# Patient Record
Sex: Female | Born: 1935
Health system: Southern US, Community
[De-identification: ages and names within clinical notes are randomized; demographics above are authoritative.]

## PROBLEM LIST (undated history)

## (undated) DIAGNOSIS — I219 Acute myocardial infarction, unspecified: Secondary | ICD-10-CM

## (undated) DIAGNOSIS — R634 Abnormal weight loss: Secondary | ICD-10-CM

## (undated) DIAGNOSIS — E785 Hyperlipidemia, unspecified: Secondary | ICD-10-CM

## (undated) DIAGNOSIS — Z8489 Family history of other specified conditions: Secondary | ICD-10-CM

## (undated) DIAGNOSIS — I10 Essential (primary) hypertension: Secondary | ICD-10-CM

## (undated) DIAGNOSIS — K920 Hematemesis: Secondary | ICD-10-CM

## (undated) DIAGNOSIS — R350 Frequency of micturition: Secondary | ICD-10-CM

## (undated) DIAGNOSIS — Z972 Presence of dental prosthetic device (complete) (partial): Secondary | ICD-10-CM

## (undated) DIAGNOSIS — I340 Nonrheumatic mitral (valve) insufficiency: Secondary | ICD-10-CM

## (undated) DIAGNOSIS — M1990 Unspecified osteoarthritis, unspecified site: Secondary | ICD-10-CM

## (undated) DIAGNOSIS — I951 Orthostatic hypotension: Secondary | ICD-10-CM

## (undated) DIAGNOSIS — Z9889 Other specified postprocedural states: Secondary | ICD-10-CM

## (undated) DIAGNOSIS — R351 Nocturia: Secondary | ICD-10-CM

## (undated) DIAGNOSIS — R110 Nausea: Secondary | ICD-10-CM

## (undated) DIAGNOSIS — B029 Zoster without complications: Secondary | ICD-10-CM

## (undated) DIAGNOSIS — S32000A Wedge compression fracture of unspecified lumbar vertebra, initial encounter for closed fracture: Secondary | ICD-10-CM

## (undated) DIAGNOSIS — I255 Ischemic cardiomyopathy: Secondary | ICD-10-CM

## (undated) DIAGNOSIS — I071 Rheumatic tricuspid insufficiency: Secondary | ICD-10-CM

## (undated) DIAGNOSIS — K219 Gastro-esophageal reflux disease without esophagitis: Secondary | ICD-10-CM

## (undated) DIAGNOSIS — I272 Pulmonary hypertension, unspecified: Secondary | ICD-10-CM

## (undated) DIAGNOSIS — J189 Pneumonia, unspecified organism: Secondary | ICD-10-CM

## (undated) DIAGNOSIS — Z7901 Long term (current) use of anticoagulants: Secondary | ICD-10-CM

## (undated) DIAGNOSIS — T7840XA Allergy, unspecified, initial encounter: Secondary | ICD-10-CM

## (undated) DIAGNOSIS — R9431 Abnormal electrocardiogram [ECG] [EKG]: Secondary | ICD-10-CM

## (undated) DIAGNOSIS — D696 Thrombocytopenia, unspecified: Secondary | ICD-10-CM

## (undated) DIAGNOSIS — J918 Pleural effusion in other conditions classified elsewhere: Secondary | ICD-10-CM

## (undated) DIAGNOSIS — I5022 Chronic systolic (congestive) heart failure: Secondary | ICD-10-CM

## (undated) DIAGNOSIS — K5730 Diverticulosis of large intestine without perforation or abscess without bleeding: Secondary | ICD-10-CM

## (undated) DIAGNOSIS — I447 Left bundle-branch block, unspecified: Secondary | ICD-10-CM

## (undated) DIAGNOSIS — D649 Anemia, unspecified: Secondary | ICD-10-CM

## (undated) DIAGNOSIS — Z973 Presence of spectacles and contact lenses: Secondary | ICD-10-CM

## (undated) DIAGNOSIS — J984 Other disorders of lung: Secondary | ICD-10-CM

## (undated) DIAGNOSIS — H9190 Unspecified hearing loss, unspecified ear: Secondary | ICD-10-CM

## (undated) DIAGNOSIS — J449 Chronic obstructive pulmonary disease, unspecified: Secondary | ICD-10-CM

## (undated) DIAGNOSIS — E539 Vitamin B deficiency, unspecified: Secondary | ICD-10-CM

## (undated) DIAGNOSIS — E039 Hypothyroidism, unspecified: Secondary | ICD-10-CM

## (undated) DIAGNOSIS — I482 Chronic atrial fibrillation, unspecified: Secondary | ICD-10-CM

## (undated) DIAGNOSIS — I2510 Atherosclerotic heart disease of native coronary artery without angina pectoris: Secondary | ICD-10-CM

## (undated) HISTORY — DX: Pulmonary hypertension, unspecified: I27.20

## (undated) HISTORY — DX: Unspecified osteoarthritis, unspecified site: M19.90

## (undated) HISTORY — DX: Abnormal electrocardiogram (ECG) (EKG): R94.31

## (undated) HISTORY — DX: Hyperlipidemia, unspecified: E78.5

## (undated) HISTORY — DX: Nonrheumatic mitral (valve) insufficiency: I34.0

## (undated) HISTORY — DX: Atherosclerotic heart disease of native coronary artery without angina pectoris: I25.10

## (undated) HISTORY — DX: Abnormal weight loss: R63.4

## (undated) HISTORY — DX: Anemia, unspecified: D64.9

## (undated) HISTORY — DX: Zoster without complications: B02.9

## (undated) HISTORY — PX: TONSILLECTOMY: SUR1361

## (undated) HISTORY — DX: Allergy, unspecified, initial encounter: T78.40XA

## (undated) HISTORY — DX: Pneumonia, unspecified organism: J18.9

## (undated) HISTORY — DX: Vitamin B deficiency, unspecified: E53.9

## (undated) HISTORY — DX: Nausea: R11.0

## (undated) HISTORY — DX: Hypothyroidism, unspecified: E03.9

## (undated) HISTORY — DX: Diverticulosis of large intestine without perforation or abscess without bleeding: K57.30

## (undated) HISTORY — DX: Wedge compression fracture of unspecified lumbar vertebra, initial encounter for closed fracture: S32.000A

## (undated) HISTORY — DX: Long term (current) use of anticoagulants: Z79.01

## (undated) HISTORY — DX: Rheumatic tricuspid insufficiency: I07.1

## (undated) HISTORY — DX: Pleural effusion in other conditions classified elsewhere: J91.8

## (undated) HISTORY — PX: COLONOSCOPY: SHX174

## (undated) HISTORY — DX: Left bundle-branch block, unspecified: I44.7

## (undated) HISTORY — DX: Other specified postprocedural states: Z98.890

## (undated) HISTORY — DX: Hematemesis: K92.0

## (undated) HISTORY — DX: Other disorders of lung: J98.4

## (undated) HISTORY — DX: Ischemic cardiomyopathy: I25.5

## (undated) HISTORY — DX: Orthostatic hypotension: I95.1

## (undated) HISTORY — DX: Essential (primary) hypertension: I10

## (undated) HISTORY — DX: Chronic obstructive pulmonary disease, unspecified: J44.9

## (undated) HISTORY — DX: Thrombocytopenia, unspecified: D69.6

## (undated) HISTORY — DX: Gastro-esophageal reflux disease without esophagitis: K21.9

---

## 1960-09-13 HISTORY — PX: THYROIDECTOMY: SHX17

## 2002-09-13 HISTORY — PX: CORONARY ANGIOPLASTY: SHX604

## 2003-08-27 ENCOUNTER — Other Ambulatory Visit: Payer: Self-pay

## 2003-12-26 ENCOUNTER — Encounter: Payer: Self-pay | Admitting: Pulmonary Disease

## 2004-04-13 ENCOUNTER — Encounter: Admission: RE | Admit: 2004-04-13 | Discharge: 2004-04-13 | Payer: Self-pay | Admitting: Pulmonary Disease

## 2004-07-17 ENCOUNTER — Ambulatory Visit: Payer: Self-pay | Admitting: Cardiology

## 2004-07-17 ENCOUNTER — Ambulatory Visit: Payer: Self-pay

## 2004-08-14 ENCOUNTER — Ambulatory Visit: Payer: Self-pay | Admitting: Cardiology

## 2004-09-10 ENCOUNTER — Ambulatory Visit: Payer: Self-pay | Admitting: *Deleted

## 2004-09-25 ENCOUNTER — Ambulatory Visit: Payer: Self-pay | Admitting: Cardiovascular Disease

## 2004-11-16 ENCOUNTER — Encounter: Admission: RE | Admit: 2004-11-16 | Discharge: 2004-11-16 | Payer: Self-pay | Admitting: Pulmonary Disease

## 2005-07-19 ENCOUNTER — Ambulatory Visit: Payer: Self-pay | Admitting: Cardiology

## 2005-08-02 ENCOUNTER — Encounter: Payer: Self-pay | Admitting: Cardiology

## 2005-08-02 ENCOUNTER — Ambulatory Visit: Payer: Self-pay

## 2005-08-02 ENCOUNTER — Ambulatory Visit: Payer: Self-pay | Admitting: Gastroenterology

## 2005-08-18 ENCOUNTER — Ambulatory Visit: Payer: Self-pay | Admitting: Gastroenterology

## 2005-08-25 ENCOUNTER — Ambulatory Visit: Payer: Self-pay | Admitting: Gastroenterology

## 2005-08-25 ENCOUNTER — Encounter: Payer: Self-pay | Admitting: Family Medicine

## 2005-08-25 ENCOUNTER — Ambulatory Visit (HOSPITAL_COMMUNITY): Admission: RE | Admit: 2005-08-25 | Discharge: 2005-08-25 | Payer: Self-pay | Admitting: Gastroenterology

## 2006-07-22 ENCOUNTER — Ambulatory Visit: Payer: Self-pay | Admitting: Cardiology

## 2006-08-02 ENCOUNTER — Encounter: Payer: Self-pay | Admitting: Internal Medicine

## 2006-08-02 ENCOUNTER — Ambulatory Visit: Payer: Self-pay

## 2006-08-16 ENCOUNTER — Ambulatory Visit: Payer: Self-pay | Admitting: Cardiology

## 2006-09-19 ENCOUNTER — Ambulatory Visit: Payer: Self-pay | Admitting: Cardiology

## 2006-09-30 ENCOUNTER — Encounter: Payer: Self-pay | Admitting: Family Medicine

## 2006-10-27 ENCOUNTER — Encounter: Payer: Self-pay | Admitting: Family Medicine

## 2006-10-31 ENCOUNTER — Ambulatory Visit: Payer: Self-pay | Admitting: Cardiology

## 2006-11-23 ENCOUNTER — Encounter: Payer: Self-pay | Admitting: Family Medicine

## 2006-11-30 ENCOUNTER — Encounter: Payer: Self-pay | Admitting: Family Medicine

## 2006-12-06 ENCOUNTER — Ambulatory Visit: Payer: Self-pay | Admitting: Cardiology

## 2006-12-28 ENCOUNTER — Encounter: Payer: Self-pay | Admitting: Family Medicine

## 2007-02-02 ENCOUNTER — Encounter: Payer: Self-pay | Admitting: Family Medicine

## 2007-02-24 ENCOUNTER — Ambulatory Visit: Payer: Self-pay | Admitting: Cardiology

## 2007-02-27 ENCOUNTER — Encounter: Payer: Self-pay | Admitting: Family Medicine

## 2007-03-10 ENCOUNTER — Ambulatory Visit: Payer: Self-pay | Admitting: Cardiology

## 2007-03-10 ENCOUNTER — Encounter: Payer: Self-pay | Admitting: Family Medicine

## 2007-03-10 ENCOUNTER — Inpatient Hospital Stay (HOSPITAL_COMMUNITY): Admission: EM | Admit: 2007-03-10 | Discharge: 2007-03-22 | Payer: Self-pay | Admitting: Emergency Medicine

## 2007-03-13 ENCOUNTER — Encounter: Payer: Self-pay | Admitting: Cardiology

## 2007-03-14 DIAGNOSIS — J189 Pneumonia, unspecified organism: Secondary | ICD-10-CM

## 2007-03-14 HISTORY — DX: Pneumonia, unspecified organism: J18.9

## 2007-03-21 ENCOUNTER — Encounter: Payer: Self-pay | Admitting: Family Medicine

## 2007-03-23 ENCOUNTER — Telehealth (INDEPENDENT_AMBULATORY_CARE_PROVIDER_SITE_OTHER): Payer: Self-pay | Admitting: *Deleted

## 2007-03-24 ENCOUNTER — Ambulatory Visit: Payer: Self-pay | Admitting: Internal Medicine

## 2007-03-24 ENCOUNTER — Encounter: Payer: Self-pay | Admitting: Family Medicine

## 2007-03-27 ENCOUNTER — Ambulatory Visit: Payer: Self-pay | Admitting: Cardiology

## 2007-03-31 ENCOUNTER — Ambulatory Visit: Payer: Self-pay | Admitting: Internal Medicine

## 2007-03-31 ENCOUNTER — Ambulatory Visit: Payer: Self-pay

## 2007-03-31 ENCOUNTER — Encounter: Payer: Self-pay | Admitting: Family Medicine

## 2007-04-11 ENCOUNTER — Ambulatory Visit: Payer: Self-pay | Admitting: Family Medicine

## 2007-04-11 DIAGNOSIS — R0902 Hypoxemia: Secondary | ICD-10-CM

## 2007-04-11 DIAGNOSIS — D518 Other vitamin B12 deficiency anemias: Secondary | ICD-10-CM

## 2007-04-11 DIAGNOSIS — E039 Hypothyroidism, unspecified: Secondary | ICD-10-CM

## 2007-04-12 ENCOUNTER — Encounter: Payer: Self-pay | Admitting: Family Medicine

## 2007-04-12 ENCOUNTER — Ambulatory Visit: Payer: Self-pay | Admitting: Cardiology

## 2007-04-13 ENCOUNTER — Encounter: Payer: Self-pay | Admitting: Family Medicine

## 2007-04-14 LAB — CONVERTED CEMR LAB
Basophils Absolute: 0.2 10*3/uL — ABNORMAL HIGH (ref 0.0–0.1)
Basophils Relative: 3.1 % — ABNORMAL HIGH (ref 0.0–1.0)
Hemoglobin: 11.6 g/dL — ABNORMAL LOW (ref 12.0–15.0)
MCHC: 33.8 g/dL (ref 30.0–36.0)
Monocytes Absolute: 0.9 10*3/uL — ABNORMAL HIGH (ref 0.2–0.7)
Monocytes Relative: 13.8 % — ABNORMAL HIGH (ref 3.0–11.0)
Platelets: 185 10*3/uL (ref 150–400)
RBC: 3.8 M/uL — ABNORMAL LOW (ref 3.87–5.11)
RDW: 14.3 % (ref 11.5–14.6)
TSH: 2.38 microintl units/mL (ref 0.35–5.50)
WBC: 6.7 10*3/uL (ref 4.5–10.5)

## 2007-04-26 ENCOUNTER — Encounter: Payer: Self-pay | Admitting: Family Medicine

## 2007-05-11 ENCOUNTER — Ambulatory Visit: Payer: Self-pay | Admitting: Family Medicine

## 2007-05-11 DIAGNOSIS — H9319 Tinnitus, unspecified ear: Secondary | ICD-10-CM | POA: Insufficient documentation

## 2007-05-11 DIAGNOSIS — H8110 Benign paroxysmal vertigo, unspecified ear: Secondary | ICD-10-CM

## 2007-05-11 DIAGNOSIS — M810 Age-related osteoporosis without current pathological fracture: Secondary | ICD-10-CM | POA: Insufficient documentation

## 2007-05-18 ENCOUNTER — Ambulatory Visit: Payer: Self-pay | Admitting: Cardiology

## 2007-05-19 ENCOUNTER — Telehealth: Payer: Self-pay | Admitting: Family Medicine

## 2007-05-19 ENCOUNTER — Encounter: Payer: Self-pay | Admitting: Family Medicine

## 2007-05-23 ENCOUNTER — Ambulatory Visit (HOSPITAL_COMMUNITY): Admission: RE | Admit: 2007-05-23 | Discharge: 2007-05-23 | Payer: Self-pay | Admitting: Cardiology

## 2007-05-23 ENCOUNTER — Ambulatory Visit: Payer: Self-pay | Admitting: Cardiology

## 2007-05-25 ENCOUNTER — Telehealth (INDEPENDENT_AMBULATORY_CARE_PROVIDER_SITE_OTHER): Payer: Self-pay | Admitting: *Deleted

## 2007-05-31 ENCOUNTER — Ambulatory Visit: Payer: Self-pay | Admitting: Family Medicine

## 2007-05-31 LAB — CONVERTED CEMR LAB
INR: 3.1
Prothrombin Time: 21.1 s

## 2007-06-05 ENCOUNTER — Ambulatory Visit: Payer: Self-pay | Admitting: Cardiology

## 2007-06-13 ENCOUNTER — Telehealth (INDEPENDENT_AMBULATORY_CARE_PROVIDER_SITE_OTHER): Payer: Self-pay | Admitting: *Deleted

## 2007-06-28 ENCOUNTER — Ambulatory Visit: Payer: Self-pay | Admitting: Family Medicine

## 2007-06-28 LAB — CONVERTED CEMR LAB: Prothrombin Time: 19.7 s

## 2007-07-19 ENCOUNTER — Ambulatory Visit: Payer: Self-pay | Admitting: Family Medicine

## 2007-07-19 ENCOUNTER — Ambulatory Visit: Payer: Self-pay | Admitting: Cardiology

## 2007-07-19 DIAGNOSIS — R350 Frequency of micturition: Secondary | ICD-10-CM | POA: Insufficient documentation

## 2007-07-19 LAB — CONVERTED CEMR LAB
Bacteria, UA: 0
Bilirubin Urine: NEGATIVE
Casts: 0 /lpf
Glucose, Urine, Semiquant: NEGATIVE
Mucus, UA: 0
Nitrite: NEGATIVE
Prothrombin Time: 17.5 s
RBC / HPF: 0
Specific Gravity, Urine: 1.02
Urine crystals, microscopic: 0 /hpf
pH: 5

## 2007-07-24 ENCOUNTER — Encounter (INDEPENDENT_AMBULATORY_CARE_PROVIDER_SITE_OTHER): Payer: Self-pay | Admitting: *Deleted

## 2007-07-24 LAB — CONVERTED CEMR LAB
Basophils Absolute: 0 10*3/uL (ref 0.0–0.1)
Eosinophils Absolute: 0.1 10*3/uL (ref 0.0–0.6)
HCT: 36.2 % (ref 36.0–46.0)
MCHC: 35 g/dL (ref 30.0–36.0)
MCV: 92 fL (ref 78.0–100.0)
Monocytes Absolute: 0.5 10*3/uL (ref 0.2–0.7)
Monocytes Relative: 9.6 % (ref 3.0–11.0)
Neutrophils Relative %: 63.6 % (ref 43.0–77.0)
RBC: 3.94 M/uL (ref 3.87–5.11)
TSH: 0.29 microintl units/mL — ABNORMAL LOW (ref 0.35–5.50)
Vitamin B-12: >1500 pg/mL — ABNORMAL HIGH (ref 211–911)

## 2007-08-24 ENCOUNTER — Ambulatory Visit: Payer: Self-pay | Admitting: Family Medicine

## 2007-08-28 LAB — CONVERTED CEMR LAB
ALT: 22 units/L (ref 0–35)
HDL: 51.8 mg/dL (ref >39.0)
LDL Cholesterol: 84 mg/dL (ref 0–99)
Triglycerides: 47 mg/dL (ref 0–149)
VLDL: 9 mg/dL (ref 0–40)

## 2007-09-05 ENCOUNTER — Ambulatory Visit: Payer: Self-pay | Admitting: Family Medicine

## 2007-09-13 ENCOUNTER — Ambulatory Visit: Payer: Self-pay | Admitting: Family Medicine

## 2007-09-13 DIAGNOSIS — I872 Venous insufficiency (chronic) (peripheral): Secondary | ICD-10-CM | POA: Insufficient documentation

## 2007-09-19 ENCOUNTER — Ambulatory Visit: Payer: Self-pay | Admitting: Family Medicine

## 2007-09-19 LAB — CONVERTED CEMR LAB: INR: 1.6

## 2007-09-26 ENCOUNTER — Telehealth: Payer: Self-pay | Admitting: Family Medicine

## 2007-09-27 ENCOUNTER — Telehealth (INDEPENDENT_AMBULATORY_CARE_PROVIDER_SITE_OTHER): Payer: Self-pay | Admitting: *Deleted

## 2007-09-27 ENCOUNTER — Ambulatory Visit: Payer: Self-pay | Admitting: Family Medicine

## 2007-09-27 LAB — CONVERTED CEMR LAB
INR: 1.9
INR: 1.9 — ABNORMAL HIGH (ref 0.8–1.0)
Prothrombin Time: 17.1 s
Prothrombin Time: 17.1 s — ABNORMAL HIGH (ref 10.9–13.3)

## 2007-10-11 ENCOUNTER — Ambulatory Visit: Payer: Self-pay | Admitting: Family Medicine

## 2007-11-14 ENCOUNTER — Ambulatory Visit: Payer: Self-pay | Admitting: Internal Medicine

## 2007-12-18 ENCOUNTER — Encounter: Payer: Self-pay | Admitting: Family Medicine

## 2007-12-18 ENCOUNTER — Ambulatory Visit: Payer: Self-pay | Admitting: Cardiology

## 2007-12-18 ENCOUNTER — Ambulatory Visit: Payer: Self-pay | Admitting: Internal Medicine

## 2007-12-18 LAB — CONVERTED CEMR LAB
INR range: 17.5
INR: 2
Prothrombin Time: 17 s

## 2008-01-02 ENCOUNTER — Telehealth: Payer: Self-pay | Admitting: Family Medicine

## 2008-01-08 ENCOUNTER — Ambulatory Visit: Payer: Self-pay | Admitting: Family Medicine

## 2008-01-09 LAB — CONVERTED CEMR LAB
INR: 2.7 — ABNORMAL HIGH (ref 0.8–1.0)
Prothrombin Time: 28.3 s — ABNORMAL HIGH (ref 10.9–13.3)

## 2008-02-06 ENCOUNTER — Ambulatory Visit: Payer: Self-pay | Admitting: Family Medicine

## 2008-02-08 ENCOUNTER — Telehealth (INDEPENDENT_AMBULATORY_CARE_PROVIDER_SITE_OTHER): Payer: Self-pay | Admitting: *Deleted

## 2008-02-16 ENCOUNTER — Ambulatory Visit: Payer: Self-pay | Admitting: Family Medicine

## 2008-02-16 LAB — CONVERTED CEMR LAB

## 2008-03-18 ENCOUNTER — Ambulatory Visit: Payer: Self-pay | Admitting: Family Medicine

## 2008-03-19 LAB — CONVERTED CEMR LAB
INR: 2.7 — ABNORMAL HIGH (ref 0.8–1.0)
Prothrombin Time: 28.5 s — ABNORMAL HIGH (ref 10.9–13.3)
TSH: 0.73 microintl units/mL (ref 0.35–5.50)

## 2008-04-15 ENCOUNTER — Telehealth (INDEPENDENT_AMBULATORY_CARE_PROVIDER_SITE_OTHER): Payer: Self-pay | Admitting: *Deleted

## 2008-04-15 ENCOUNTER — Ambulatory Visit: Payer: Self-pay | Admitting: Family Medicine

## 2008-04-15 LAB — CONVERTED CEMR LAB
INR: 2.6
Prothrombin Time: 19.5 s

## 2008-04-16 ENCOUNTER — Emergency Department (HOSPITAL_COMMUNITY): Admission: EM | Admit: 2008-04-16 | Discharge: 2008-04-16 | Payer: Self-pay | Admitting: Emergency Medicine

## 2008-04-16 ENCOUNTER — Encounter: Payer: Self-pay | Admitting: Family Medicine

## 2008-04-18 ENCOUNTER — Ambulatory Visit: Payer: Self-pay | Admitting: Family Medicine

## 2008-04-18 DIAGNOSIS — J984 Other disorders of lung: Secondary | ICD-10-CM | POA: Insufficient documentation

## 2008-04-26 ENCOUNTER — Ambulatory Visit: Payer: Self-pay | Admitting: Family Medicine

## 2008-04-26 LAB — CONVERTED CEMR LAB: INR: 2.3

## 2008-05-10 ENCOUNTER — Ambulatory Visit: Payer: Self-pay | Admitting: Family Medicine

## 2008-05-10 LAB — CONVERTED CEMR LAB
Bilirubin Urine: NEGATIVE
Glucose, Urine, Semiquant: NEGATIVE
Ketones, urine, test strip: NEGATIVE
Protein, U semiquant: NEGATIVE
Yeast, UA: 0
pH: 6

## 2008-05-13 ENCOUNTER — Ambulatory Visit: Payer: Self-pay | Admitting: Pulmonary Disease

## 2008-05-13 DIAGNOSIS — R599 Enlarged lymph nodes, unspecified: Secondary | ICD-10-CM | POA: Insufficient documentation

## 2008-05-14 ENCOUNTER — Telehealth: Payer: Self-pay | Admitting: Family Medicine

## 2008-05-14 DIAGNOSIS — S32000A Wedge compression fracture of unspecified lumbar vertebra, initial encounter for closed fracture: Secondary | ICD-10-CM

## 2008-05-14 HISTORY — DX: Wedge compression fracture of unspecified lumbar vertebra, initial encounter for closed fracture: S32.000A

## 2008-05-15 ENCOUNTER — Encounter: Payer: Self-pay | Admitting: Family Medicine

## 2008-05-21 ENCOUNTER — Ambulatory Visit (HOSPITAL_COMMUNITY): Admission: RE | Admit: 2008-05-21 | Discharge: 2008-05-21 | Payer: Self-pay | Admitting: Pulmonary Disease

## 2008-05-23 ENCOUNTER — Telehealth: Payer: Self-pay | Admitting: Pulmonary Disease

## 2008-05-24 ENCOUNTER — Telehealth (INDEPENDENT_AMBULATORY_CARE_PROVIDER_SITE_OTHER): Payer: Self-pay | Admitting: *Deleted

## 2008-06-07 ENCOUNTER — Ambulatory Visit: Payer: Self-pay | Admitting: Family Medicine

## 2008-06-07 DIAGNOSIS — R498 Other voice and resonance disorders: Secondary | ICD-10-CM

## 2008-06-10 LAB — CONVERTED CEMR LAB
Albumin: 4.3 g/dL (ref 3.5–5.2)
BUN: 29 mg/dL — ABNORMAL HIGH (ref 6–23)
Calcium: 9.4 mg/dL (ref 8.4–10.5)
Chloride: 108 meq/L (ref 96–112)
Folate: >20 ng/mL
Glucose, Bld: 86 mg/dL (ref 70–99)
Lymphs Abs: 1.9 10*3/uL (ref 0.7–4.0)
MCV: 94.2 fL (ref 78.0–100.0)
Monocytes Relative: 12 % (ref 3–12)
Neutro Abs: 4 10*3/uL (ref 1.7–7.7)
Neutrophils Relative %: 59 % (ref 43–77)
Potassium: 4.7 meq/L (ref 3.5–5.3)
RBC: 4.32 M/uL (ref 3.87–5.11)
WBC: 6.8 10*3/uL (ref 4.0–10.5)

## 2008-06-12 ENCOUNTER — Ambulatory Visit: Payer: Self-pay | Admitting: Cardiology

## 2008-06-12 ENCOUNTER — Ambulatory Visit: Payer: Self-pay

## 2008-07-04 ENCOUNTER — Ambulatory Visit: Payer: Self-pay | Admitting: Cardiology

## 2008-07-04 ENCOUNTER — Ambulatory Visit: Payer: Self-pay | Admitting: Family Medicine

## 2008-07-04 LAB — CONVERTED CEMR LAB: Prothrombin Time: 17.1 s

## 2008-07-08 ENCOUNTER — Ambulatory Visit: Payer: Self-pay | Admitting: Family Medicine

## 2008-08-07 ENCOUNTER — Ambulatory Visit: Payer: Self-pay | Admitting: Family Medicine

## 2008-09-04 ENCOUNTER — Ambulatory Visit: Payer: Self-pay | Admitting: Family Medicine

## 2008-09-04 LAB — CONVERTED CEMR LAB: Prothrombin Time: 17.1 s

## 2008-09-18 ENCOUNTER — Ambulatory Visit: Payer: Self-pay | Admitting: Family Medicine

## 2008-09-18 LAB — CONVERTED CEMR LAB: INR: 2.2

## 2008-10-15 ENCOUNTER — Ambulatory Visit: Payer: Self-pay | Admitting: Family Medicine

## 2008-10-15 LAB — CONVERTED CEMR LAB
INR: 2.6
Prothrombin Time: 19.7 s

## 2008-11-08 ENCOUNTER — Ambulatory Visit: Payer: Self-pay | Admitting: Family Medicine

## 2008-11-08 LAB — CONVERTED CEMR LAB
INR: 3
Prothrombin Time: 20.9 s

## 2008-12-05 ENCOUNTER — Ambulatory Visit: Payer: Self-pay | Admitting: Family Medicine

## 2008-12-05 ENCOUNTER — Encounter: Payer: Self-pay | Admitting: Cardiology

## 2008-12-05 ENCOUNTER — Ambulatory Visit: Payer: Self-pay | Admitting: Cardiology

## 2008-12-05 DIAGNOSIS — J309 Allergic rhinitis, unspecified: Secondary | ICD-10-CM | POA: Insufficient documentation

## 2008-12-11 LAB — CONVERTED CEMR LAB
AST: 30 units/L (ref 0–37)
Basophils Relative: 0.4 % (ref 0.0–3.0)
Eosinophils Absolute: 0.1 10*3/uL (ref 0.0–0.7)
LDL Cholesterol: 70 mg/dL (ref 0–99)
MCHC: 33 g/dL (ref 30.0–36.0)
MCV: 94.2 fL (ref 78.0–100.0)
Monocytes Absolute: 0.7 10*3/uL (ref 0.1–1.0)
Neutrophils Relative %: 61.8 % (ref 43.0–77.0)
Platelets: 105 10*3/uL — ABNORMAL LOW (ref 150.0–400.0)
Potassium: 4.3 meq/L (ref 3.5–5.1)
RBC: 4.16 M/uL (ref 3.87–5.11)
Sodium: 142 meq/L (ref 135–145)
Total CHOL/HDL Ratio: 2

## 2009-01-01 ENCOUNTER — Encounter: Payer: Self-pay | Admitting: Family Medicine

## 2009-01-02 ENCOUNTER — Ambulatory Visit: Payer: Self-pay | Admitting: Family Medicine

## 2009-01-02 LAB — CONVERTED CEMR LAB
INR: 2.5
Prothrombin Time: 19.4 s

## 2009-01-03 DIAGNOSIS — D696 Thrombocytopenia, unspecified: Secondary | ICD-10-CM

## 2009-01-03 LAB — CONVERTED CEMR LAB
Basophils Relative: 0.8 % (ref 0.0–3.0)
Eosinophils Relative: 1.2 % (ref 0.0–5.0)
Hemoglobin: 12.7 g/dL (ref 12.0–15.0)
Lymphocytes Relative: 26.5 % (ref 12.0–46.0)
MCHC: 33.8 g/dL (ref 30.0–36.0)
Monocytes Relative: 13.2 % — ABNORMAL HIGH (ref 3.0–12.0)
Neutro Abs: 3.5 10*3/uL (ref 1.4–7.7)
RBC: 3.98 M/uL (ref 3.87–5.11)
WBC: 6 10*3/uL (ref 4.5–10.5)

## 2009-01-07 ENCOUNTER — Ambulatory Visit: Payer: Self-pay | Admitting: Oncology

## 2009-01-28 ENCOUNTER — Encounter: Payer: Self-pay | Admitting: Family Medicine

## 2009-01-28 ENCOUNTER — Encounter: Payer: Self-pay | Admitting: Cardiology

## 2009-01-28 LAB — CBC WITH DIFFERENTIAL (CANCER CENTER ONLY)
BASO%: 0.7 % (ref 0.0–2.0)
EOS%: 1.9 % (ref 0.0–7.0)
HCT: 38.1 % (ref 34.8–46.6)
LYMPH%: 26 % (ref 14.0–48.0)
MCH: 31 pg (ref 26.0–34.0)
MCHC: 34.5 g/dL (ref 32.0–36.0)
MCV: 90 fL (ref 81–101)
NEUT%: 61 % (ref 39.6–80.0)
RDW: 13.3 % (ref 10.5–14.6)

## 2009-01-28 LAB — CMP (CANCER CENTER ONLY)
AST: 32 U/L (ref 11–38)
Albumin: 3.6 g/dL (ref 3.3–5.5)
BUN, Bld: 23 mg/dL — ABNORMAL HIGH (ref 7–22)
Calcium: 9.3 mg/dL (ref 8.0–10.3)
Chloride: 105 mEq/L (ref 98–108)
Glucose, Bld: 92 mg/dL (ref 73–118)
Potassium: 4.1 mEq/L (ref 3.3–4.7)
Sodium: 146 mEq/L — ABNORMAL HIGH (ref 128–145)
Total Protein: 6.7 g/dL (ref 6.4–8.1)

## 2009-01-28 LAB — MORPHOLOGY - CHCC SATELLITE

## 2009-01-29 ENCOUNTER — Ambulatory Visit: Payer: Self-pay | Admitting: Family Medicine

## 2009-01-30 LAB — PROTEIN ELECTROPHORESIS, SERUM
Albumin ELP: 61.8 % (ref 55.8–66.1)
Beta 2: 3.2 % (ref 3.2–6.5)
Beta Globulin: 6.3 % (ref 4.7–7.2)
Total Protein, Serum Electrophoresis: 6.2 g/dL (ref 6.0–8.3)

## 2009-02-03 ENCOUNTER — Ambulatory Visit: Payer: Self-pay | Admitting: Cardiovascular Disease

## 2009-02-03 ENCOUNTER — Encounter: Payer: Self-pay | Admitting: Nurse Practitioner

## 2009-02-03 LAB — CONVERTED CEMR LAB
CO2: 29 meq/L (ref 19–32)
Chloride: 111 meq/L (ref 96–112)
Glucose, Bld: 89 mg/dL (ref 70–99)
Potassium: 4.3 meq/L (ref 3.5–5.1)
Sodium: 144 meq/L (ref 135–145)

## 2009-02-12 ENCOUNTER — Ambulatory Visit: Payer: Self-pay | Admitting: Cardiology

## 2009-02-13 LAB — CONVERTED CEMR LAB
BUN: 25 mg/dL — ABNORMAL HIGH (ref 6–23)
CO2: 32 meq/L (ref 19–32)
Chloride: 111 meq/L (ref 96–112)
Creatinine, Ser: 1 mg/dL (ref 0.4–1.2)
Potassium: 4.4 meq/L (ref 3.5–5.1)

## 2009-02-21 ENCOUNTER — Ambulatory Visit: Payer: Self-pay | Admitting: Oncology

## 2009-02-24 ENCOUNTER — Ambulatory Visit: Payer: Self-pay | Admitting: Family Medicine

## 2009-03-18 ENCOUNTER — Encounter: Payer: Self-pay | Admitting: Cardiology

## 2009-03-18 DIAGNOSIS — K219 Gastro-esophageal reflux disease without esophagitis: Secondary | ICD-10-CM

## 2009-03-20 ENCOUNTER — Ambulatory Visit: Payer: Self-pay | Admitting: Family Medicine

## 2009-03-20 ENCOUNTER — Ambulatory Visit: Payer: Self-pay | Admitting: Cardiology

## 2009-03-20 DIAGNOSIS — M109 Gout, unspecified: Secondary | ICD-10-CM

## 2009-03-20 LAB — CONVERTED CEMR LAB: INR: 2.3

## 2009-04-11 ENCOUNTER — Ambulatory Visit: Payer: Self-pay | Admitting: Oncology

## 2009-04-15 ENCOUNTER — Ambulatory Visit (HOSPITAL_COMMUNITY): Admission: RE | Admit: 2009-04-15 | Discharge: 2009-04-15 | Payer: Self-pay | Admitting: Oncology

## 2009-04-15 LAB — BASIC METABOLIC PANEL - CANCER CENTER ONLY
BUN, Bld: 15 mg/dL (ref 7–22)
CO2: 33 mEq/L (ref 18–33)
Calcium: 9 mg/dL (ref 8.0–10.3)
Creat: 0.9 mg/dl (ref 0.6–1.2)
Glucose, Bld: 73 mg/dL (ref 73–118)

## 2009-04-15 LAB — CBC WITH DIFFERENTIAL (CANCER CENTER ONLY)
Eosinophils Absolute: 0.1 10*3/uL (ref 0.0–0.5)
HGB: 12.6 g/dL (ref 11.6–15.9)
LYMPH%: 26.6 % (ref 14.0–48.0)
MCH: 30.5 pg (ref 26.0–34.0)
MCHC: 34.5 g/dL (ref 32.0–36.0)
MONO#: 0.6 10*3/uL (ref 0.1–0.9)
NEUT%: 60.8 % (ref 39.6–80.0)
RDW: 13.9 % (ref 10.5–14.6)

## 2009-04-17 ENCOUNTER — Ambulatory Visit: Payer: Self-pay | Admitting: Family Medicine

## 2009-04-17 LAB — CONVERTED CEMR LAB
INR: 2.8
Prothrombin Time: 20.4 s

## 2009-04-21 ENCOUNTER — Telehealth: Payer: Self-pay | Admitting: Cardiology

## 2009-04-30 ENCOUNTER — Ambulatory Visit: Payer: Self-pay | Admitting: Family Medicine

## 2009-05-02 ENCOUNTER — Telehealth: Payer: Self-pay | Admitting: Family Medicine

## 2009-05-05 ENCOUNTER — Telehealth: Payer: Self-pay | Admitting: Family Medicine

## 2009-05-06 ENCOUNTER — Encounter: Payer: Self-pay | Admitting: Family Medicine

## 2009-05-09 ENCOUNTER — Telehealth: Payer: Self-pay | Admitting: Cardiology

## 2009-05-12 ENCOUNTER — Encounter: Payer: Self-pay | Admitting: Family Medicine

## 2009-05-22 ENCOUNTER — Telehealth: Payer: Self-pay | Admitting: Family Medicine

## 2009-05-26 ENCOUNTER — Encounter: Payer: Self-pay | Admitting: Family Medicine

## 2009-05-26 ENCOUNTER — Telehealth: Payer: Self-pay | Admitting: Family Medicine

## 2009-05-27 ENCOUNTER — Telehealth: Payer: Self-pay | Admitting: Family Medicine

## 2009-05-29 ENCOUNTER — Ambulatory Visit: Payer: Self-pay | Admitting: Family Medicine

## 2009-05-29 LAB — CONVERTED CEMR LAB: Prothrombin Time: 18.4 s

## 2009-06-12 ENCOUNTER — Encounter: Payer: Self-pay | Admitting: Family Medicine

## 2009-06-24 ENCOUNTER — Encounter: Payer: Self-pay | Admitting: Cardiology

## 2009-06-24 DIAGNOSIS — I10 Essential (primary) hypertension: Secondary | ICD-10-CM | POA: Insufficient documentation

## 2009-06-25 ENCOUNTER — Ambulatory Visit: Payer: Self-pay | Admitting: Cardiology

## 2009-06-25 ENCOUNTER — Ambulatory Visit: Payer: Self-pay | Admitting: Family Medicine

## 2009-06-25 LAB — CONVERTED CEMR LAB: INR: 2.8

## 2009-07-23 ENCOUNTER — Ambulatory Visit: Payer: Self-pay | Admitting: Family Medicine

## 2009-09-04 ENCOUNTER — Ambulatory Visit: Payer: Self-pay | Admitting: Internal Medicine

## 2009-09-04 LAB — CONVERTED CEMR LAB
INR: 2.7
Prothrombin Time: 19.8 s

## 2009-10-10 ENCOUNTER — Ambulatory Visit: Payer: Self-pay | Admitting: Internal Medicine

## 2009-10-10 LAB — CONVERTED CEMR LAB
INR: 1.9
Prothrombin Time: 16.8 s

## 2009-11-07 ENCOUNTER — Ambulatory Visit: Payer: Self-pay | Admitting: Internal Medicine

## 2009-11-07 LAB — CONVERTED CEMR LAB: INR: 2

## 2009-12-18 ENCOUNTER — Encounter: Payer: Self-pay | Admitting: Cardiology

## 2009-12-19 ENCOUNTER — Ambulatory Visit: Payer: Self-pay | Admitting: Cardiology

## 2009-12-19 ENCOUNTER — Ambulatory Visit: Payer: Self-pay | Admitting: Internal Medicine

## 2009-12-19 LAB — CONVERTED CEMR LAB: INR: 1.9

## 2009-12-22 LAB — CONVERTED CEMR LAB
BUN: 25 mg/dL — ABNORMAL HIGH (ref 6–23)
Calcium: 9.2 mg/dL (ref 8.4–10.5)
Chloride: 105 meq/L (ref 96–112)
Creatinine, Ser: 1.2 mg/dL (ref 0.4–1.2)
GFR calc non Af Amer: 45.25 mL/min (ref >60)

## 2009-12-23 ENCOUNTER — Encounter: Payer: Self-pay | Admitting: Family Medicine

## 2009-12-23 ENCOUNTER — Telehealth: Payer: Self-pay | Admitting: Family Medicine

## 2009-12-29 ENCOUNTER — Ambulatory Visit: Payer: Self-pay | Admitting: Family Medicine

## 2010-01-16 ENCOUNTER — Ambulatory Visit: Payer: Self-pay | Admitting: Internal Medicine

## 2010-02-19 ENCOUNTER — Ambulatory Visit: Payer: Self-pay | Admitting: Internal Medicine

## 2010-03-17 ENCOUNTER — Ambulatory Visit: Payer: Self-pay | Admitting: Internal Medicine

## 2010-03-17 LAB — CONVERTED CEMR LAB
INR: 1.9
Prothrombin Time: 22.8 s

## 2010-04-14 ENCOUNTER — Telehealth: Payer: Self-pay | Admitting: Family Medicine

## 2010-04-17 ENCOUNTER — Ambulatory Visit: Payer: Self-pay | Admitting: Internal Medicine

## 2010-04-17 LAB — CONVERTED CEMR LAB
INR: 2.4
Prothrombin Time: 28.2 s

## 2010-05-22 ENCOUNTER — Ambulatory Visit: Payer: Self-pay | Admitting: Internal Medicine

## 2010-05-22 LAB — CONVERTED CEMR LAB
INR: 2.4
Prothrombin Time: 28.2 s

## 2010-06-18 ENCOUNTER — Ambulatory Visit: Payer: Self-pay

## 2010-06-18 ENCOUNTER — Ambulatory Visit: Payer: Self-pay | Admitting: Family Medicine

## 2010-06-18 ENCOUNTER — Ambulatory Visit: Payer: Self-pay | Admitting: Cardiology

## 2010-06-18 ENCOUNTER — Ambulatory Visit (HOSPITAL_COMMUNITY): Admission: RE | Admit: 2010-06-18 | Discharge: 2010-06-18 | Payer: Self-pay | Admitting: Cardiology

## 2010-06-18 ENCOUNTER — Encounter: Payer: Self-pay | Admitting: Cardiology

## 2010-06-18 ENCOUNTER — Ambulatory Visit: Payer: Self-pay | Admitting: Internal Medicine

## 2010-06-18 ENCOUNTER — Ambulatory Visit: Payer: Self-pay | Admitting: Cardiovascular Disease

## 2010-06-18 DIAGNOSIS — R0602 Shortness of breath: Secondary | ICD-10-CM

## 2010-06-19 ENCOUNTER — Encounter: Payer: Self-pay | Admitting: Cardiology

## 2010-07-04 ENCOUNTER — Encounter: Payer: Self-pay | Admitting: Cardiology

## 2010-07-07 ENCOUNTER — Ambulatory Visit: Payer: Self-pay | Admitting: Family Medicine

## 2010-07-07 LAB — CONVERTED CEMR LAB: INR: 2.2

## 2010-07-09 ENCOUNTER — Ambulatory Visit: Payer: Self-pay | Admitting: Cardiology

## 2010-07-13 ENCOUNTER — Ambulatory Visit: Payer: Self-pay | Admitting: Pulmonary Disease

## 2010-07-13 ENCOUNTER — Telehealth: Payer: Self-pay | Admitting: Family Medicine

## 2010-07-13 DIAGNOSIS — R930 Abnormal findings on diagnostic imaging of skull and head, not elsewhere classified: Secondary | ICD-10-CM

## 2010-07-13 LAB — CONVERTED CEMR LAB
ALT: 21 units/L (ref 0–35)
BUN: 21 mg/dL (ref 6–23)
CO2: 28 meq/L (ref 19–32)
Calcium: 9.1 mg/dL (ref 8.4–10.5)
Creatinine, Ser: 0.9 mg/dL (ref 0.4–1.2)
Glucose, Bld: 84 mg/dL (ref 70–99)
HDL: 57 mg/dL (ref >39.00)
LDL Cholesterol: 71 mg/dL (ref 0–99)
Total CHOL/HDL Ratio: 2
Triglycerides: 55 mg/dL (ref 0.0–149.0)

## 2010-08-04 ENCOUNTER — Ambulatory Visit: Payer: Self-pay | Admitting: Family Medicine

## 2010-08-04 ENCOUNTER — Ambulatory Visit: Payer: Self-pay | Admitting: Cardiology

## 2010-08-07 ENCOUNTER — Encounter: Payer: Self-pay | Admitting: Pulmonary Disease

## 2010-08-10 ENCOUNTER — Ambulatory Visit: Payer: Self-pay | Admitting: Pulmonary Disease

## 2010-09-04 ENCOUNTER — Ambulatory Visit: Payer: Self-pay | Admitting: Family Medicine

## 2010-09-04 LAB — CONVERTED CEMR LAB
INR: 2.1
Prothrombin Time: 25.3 s

## 2010-09-08 ENCOUNTER — Ambulatory Visit: Payer: Self-pay | Admitting: Cardiology

## 2010-09-08 ENCOUNTER — Telehealth: Payer: Self-pay | Admitting: Pulmonary Disease

## 2010-09-09 ENCOUNTER — Telehealth (INDEPENDENT_AMBULATORY_CARE_PROVIDER_SITE_OTHER): Payer: Self-pay | Admitting: *Deleted

## 2010-09-25 ENCOUNTER — Ambulatory Visit
Admission: RE | Admit: 2010-09-25 | Discharge: 2010-09-25 | Payer: Self-pay | Source: Home / Self Care | Attending: Family Medicine | Admitting: Family Medicine

## 2010-09-25 LAB — CONVERTED CEMR LAB
INR: 2.3
Prothrombin Time: 27.3 s

## 2010-10-11 LAB — CONVERTED CEMR LAB
BUN: 28 mg/dL — ABNORMAL HIGH (ref 6–23)
Basophils Absolute: 0 10*3/uL (ref 0.0–0.1)
Basophils Absolute: 0.1 10*3/uL (ref 0.0–0.1)
CO2: 31 meq/L (ref 19–32)
Calcium: 9.3 mg/dL (ref 8.4–10.5)
Creatinine, Ser: 1.2 mg/dL (ref 0.4–1.2)
Eosinophils Absolute: 0.1 10*3/uL (ref 0.0–0.7)
Eosinophils Absolute: 0.1 10*3/uL (ref 0.0–0.7)
GFR calc non Af Amer: 47.47 mL/min (ref >60)
Glucose, Bld: 82 mg/dL (ref 70–99)
HCT: 38.9 % (ref 36.0–46.0)
Hemoglobin: 13.2 g/dL (ref 12.0–15.0)
INR: 2.2
Lymphocytes Relative: 20.6 % (ref 12.0–46.0)
Lymphocytes Relative: 22.8 % (ref 12.0–46.0)
Lymphs Abs: 1.5 10*3/uL (ref 0.7–4.0)
Lymphs Abs: 1.7 10*3/uL (ref 0.7–4.0)
MCHC: 33.8 g/dL (ref 30.0–36.0)
MCHC: 33.9 g/dL (ref 30.0–36.0)
Monocytes Relative: 2.8 % — ABNORMAL LOW (ref 3.0–12.0)
Neutro Abs: 5 10*3/uL (ref 1.4–7.7)
Neutro Abs: 5.4 10*3/uL (ref 1.4–7.7)
Platelets: 111 10*3/uL — ABNORMAL LOW (ref 150.0–400.0)
Platelets: 153 10*3/uL (ref 150.0–400.0)
Prothrombin Time: 19 s
RDW: 13 % (ref 11.5–14.6)
RDW: 14.6 % (ref 11.5–14.6)
Sodium: 142 meq/L (ref 135–145)
TSH: 0.62 microintl units/mL (ref 0.35–5.50)

## 2010-10-14 NOTE — Assessment & Plan Note (Signed)
Summary: B-12/LETVAK  Nurse Visit   Allergies: 1)  ! * Lamasil 2)  ! Fosamax 3)  ! Sulfa  Medication Administration  Injection # 1:    Medication: Vit B12 1000 mcg    Diagnosis: ANEMIA, B12 DEFICIENCY (ICD-281.1)    Route: IM    Site: L deltoid    Exp Date: 12/13/2011    Lot #: [id number]    Mfr: [employer]    Patient tolerated injection without complications    Given by: Linde Gillis CMA (AAMA) (May 22, 2010 11:01 AM)  Orders Added: 1)  Vit B12 1000 mcg [J3420] 2)  Admin of Therapeutic Inj  intramuscular or subcutaneous [04540]

## 2010-10-14 NOTE — Miscellaneous (Signed)
  Clinical Lists Changes  Observations: Added new observation of PAST MED HX: afib- since 2004.. meds adjusted for rate control (Sotolol stopped 05/2007) Coumadin therapy  CAD.[name] 2 Cypher stent was at [facility] December 2004 EF 50-55%...echo.Marland KitchenMarland Kitchen6/2008  /   35-40%...echo..06/18/2010 RV dysfunction - mild TR  moderate...echo.Marland KitchenMarland Kitchen6/2008 Pleural effusions July of 08 with pneumonia and fluid overload. Ultimately resolved hypothyroid  hyperlipidemia (tx with statin for CAD) GERD diverticulosis mitral regurgitatio...moderate...echo..02/2007...may be worse  /   moderate...echo...06/2010 allergies iron deficiency anemia-? cause onchomycosis ? scar on lung (Dr Shelle Iron)- hypoxia vitamin B12 deficiency  with anemia in the past Prolonged QT interval on sotalol( resolved off sotalol) COPD Volume status Lasix used intermittently over the years osteoarthitis- hands (? remote hx of gout) lumbar compression fracture 9/09 deg changes LS = x ray 9/09 Hypertension osteoporosis- dexa 07 thrombocytopenia  onchomycosis - fingernails  LBBB Shortness of breath (07/04/2010 21:06) Added new observation of REFERRING MD: Tower (07/04/2010 21:06) Added new observation of PRIMARY MD: [name], MD (07/04/2010 21:06)       Past History:  Past Medical History: afib- since 2004.. meds adjusted for rate control (Sotolol stopped 05/2007) Coumadin therapy  CAD.[name] 2 Cypher stent was at [facility] December 2004 EF 50-55%...echo.Marland KitchenMarland Kitchen6/2008  /   35-40%...echo..06/18/2010 RV dysfunction - mild TR  moderate...echo.Marland KitchenMarland Kitchen6/2008 Pleural effusions July of 08 with pneumonia and fluid overload. Ultimately resolved hypothyroid  hyperlipidemia (tx with statin for CAD) GERD diverticulosis mitral regurgitatio...moderate...echo..02/2007...may be worse  /   moderate...echo...06/2010 allergies iron deficiency anemia-? cause onchomycosis ? scar on lung (Dr Shelle Iron)- hypoxia vitamin B12 deficiency  with anemia in the past Prolonged QT  interval on sotalol( resolved off sotalol) COPD Volume status Lasix used intermittently over the years osteoarthitis- hands (? remote hx of gout) lumbar compression fracture 9/09 deg changes LS = x ray 9/09 Hypertension osteoporosis- dexa 07 thrombocytopenia  onchomycosis - fingernails  LBBB Shortness of breath

## 2010-10-14 NOTE — Assessment & Plan Note (Signed)
Summary: rov for enlarging RUL nodule.   Visit Type:  Follow-up Copy to:  [name] Rough MD Primary Provider/Referring Provider:  [name], MD  CC:  follow up. Pt states last time she was here they found a spot on her lung and she is here today to see if the spot is gone. pt states she has no concers today. pt quit smoking 60 years ago. [name]  History of Present Illness: the pt comes in today for f/u of her recent abnormal cxr.  At the last visit, the pt had an abnormal density in CPA that was concerning for pna.  She has been treated with a course of abx, and was asked to return today for f/u cxr.  She feels well, with no persistent pulmonary symptoms.  She has had a cxr today which shows resolution of her prior CPA density, but the radiologist feels her chronic RUL density has increased in size from 2009.  She had a PET that year which showed no hypermetabolic uptake.     Current Medications (verified): 1)  Levothyroxine Sodium 88 Mcg  Tabs (Levothyroxine Sodium) .... One By Mouth Daily 2)  Cartia Xt 120 Mg  Cp24 (Diltiazem Hcl Coated Beads) .... Take One By Mouth Daily 3)  Omeprazole 20 Mg  Tbec (Omeprazole) .... Take One By Mouth Two Times A Day 4)  Warfarin Sodium 5 Mg  Tabs (Warfarin Sodium) .... Take By Mouth As Directed 5)  Meclizine Hcl 25 Mg  Tabs (Meclizine Hcl) .... 1/2-1 By Mouth Three Times A Day As Needed Dizziness 6)  Bactroban 2 %  Oint (Mupirocin) .... Apply To Affected Area Once Daily As Needed (On Legs) 7)  Furosemide 20 Mg Tabs (Furosemide) .... Take One Tablet By Mouth Daily. 8)  Metoprolol Tartrate 50 Mg Tabs (Metoprolol Tartrate) .... 1/2 Pill By Mouth Two Times A Day 9)  Lutein 6 Mg Caps (Lutein) .... Take 1 Tablet By Mouth Once A Day 10)  Cvs Allergy Relief 10 Mg Tbdp (Loratadine) .... Take 1 Tablet By Mouth Once A Day As Needed 11)  Glucosamine-Chondroitin   Caps (Glucosamine-Chondroit-Vit C-Mn) .... 500mg  Once Daily 12)  Simvastatin 20 Mg Tabs (Simvastatin) .... Take  1/4  Tablet By Mouth Daily At Bedtime 13)  Ferrous Sulfate 325 (65 Fe) Mg Tbec (Ferrous Sulfate) .... Take 1 Tablet By Mouth Once A Day 14)  Vitamin B12 Injection .... Once A Month 15)  Multivitamins  Tabs (Multiple Vitamin) .... Take 1 Tablet By Mouth Once A Day 16)  Calcium Carbonate-Vitamin D 600-400 Mg-Unit  Tabs (Calcium Carbonate-Vitamin D) .... Take 2  Tablets  By Mouth Once A Day 17)  Nitroglycerin 0.4 Mg Subl (Nitroglycerin) .... One Tablet Under Tongue Every 5 Minutes As Needed For Chest Pain---May Repeat Times Three 18)  Tylenol Arthritis Pain 650 Mg Cr-Tabs (Acetaminophen) .... As Needed 19)  Systane 0.4-0.3 % Soln (Polyethyl Glycol-Propyl Glycol) .... Daily 20)  Flonase 50 Mcg/act Susp (Fluticasone Propionate) .... Two Times A Day As Needed  Allergies (verified): 1)  ! * Lamasil 2)  ! Fosamax 3)  ! Sulfa  Review of Systems       The patient complains of shortness of breath with activity, non-productive cough, nasal congestion/difficulty breathing through nose, and hand/feet swelling.  The patient denies shortness of breath at rest, productive cough, coughing up blood, chest pain, irregular heartbeats, acid heartburn, indigestion, loss of appetite, weight change, abdominal pain, difficulty swallowing, sore throat, tooth/dental problems, headaches, sneezing, itching, ear ache, anxiety, depression,  joint stiffness or pain, rash, change in color of mucus, and fever.    Vital Signs:  Patient profile:   77 year old female Height:      67 inches Weight:      126.25 pounds BMI:     19.85 O2 Sat:      99 % on Room air Temp:     97.4 degrees F oral Pulse rate:   59 / minute BP sitting:   120 / 66  (left arm) Cuff size:   regular  Vitals Entered By: Carver Fila (August 10, 2010 9:38 AM)  O2 Flow:  Room air CC: follow up. Pt states last time she was here they found a spot on her lung and she is here today to see if the spot is gone. pt states she has no concers today. pt quit  smoking 60 years ago.  Comments meds and allergies updated Phone number updated Carver Fila  August 10, 2010 9:39 AM    Physical Exam  General:  frail female in nad Nose:  no purulence or discharge noted. Lungs:  a few basilar crackles, no wheezing. Heart:  rrr Extremities:  mild pedal edema, no cyanosis  Neurologic:  alert and oriented, moves all 4.   Impression & Recommendations:  Problem # 1:  PULMONARY NODULE, RIGHT UPPER LOBE (ICD-518.89) the pt has a RUL nodule that has been followed since 2006.  It was PET negative in 2009, but appears to be larger based on chest xray.  There has been concern all along for malignant transformation to a scar carcinoma, and therefore we need to evaluate further since it appears to have gotten larger.  Will check chest ct, and will call pt with the results.  Other Orders: Est. Patient Level III (09811) Radiology Referral (Radiology)  Patient Instructions: 1)  will set up for ct chest to evaluate your right sided spot.  I will call you with the results.

## 2010-10-14 NOTE — Progress Notes (Signed)
Summary: regarding blood sugar and knee  Phone Note Call from Patient Call back at [phone]   Caller: Daughter [name] Summary of Call: Pt's daughter states she checked pt's blood sugar this morning and it was 86 fasting.  She is asking if she still needs to follow up with you, after having a low reading at Dr.Katz's office, or do they just need to keep a check on it.  Also, pt's right knee has been swollen since saturday night, painful.  No known injury.  Normal color and temp.  They have been using ice. Unable to bend her knee.  Her brother in law has passed and the wake is tonight and the funeral is tomorrow.  Daughter is asking what they can do to help the knee Branch that pt can get through the next couple of days. Initial call taken by: Lowella Petties CMA,  December 23, 2009 10:46 AM  Follow-up for Phone Call        keep check on sugar  visit was primarily to disc diet -- getting 3-6 small meals per day with complex carbs and protien and not skipping meals  for knee- f/u when able with me (or even better Dr Patsy Lager- sport med )  for now cannot do much but ice packs 10 min at time and tylenol - since she cannot have nsaid due to coumadin Follow-up by: [name],  December 23, 2009 11:19 AM  Additional Follow-up for Phone Call Additional follow up Details #1::        Patient's daughter,[name] notified as instructed by telephone. [name] will call and schedule appt for knee later.Lewanda Rife LPN  December 23, 2009 12:41 PM      Appended Document: regarding blood sugar and knee Pt's daughter, [name], called and said she had taken the pt to urgent care where she was dx'd as having a baker's cyst on back of leg.  She was told to take up to 2000 mg's of tylenol daily.  Follow up appt scheduled with Dr. Patsy Lager.
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
## 2010-10-14 NOTE — Assessment & Plan Note (Signed)
Summary: 2WK F/U SL   Visit Type:  Follow-up Referring Jhayden Demuro:  Clance Primary Yukio Bisping:  [name], MD  CC:  shortness of breath.  History of Present Illness: The patient is seen for cardiology followup.  I saw her last on June 18, 2010.  She was having some shortness of breath.  She is actually feeling a little bit better.  She said she has received a B12 shot.  Labs were checked and her BUN was 28 with a creatinine 1.2.  Potassium was 5.4.  She is not on any supplemental potassium.  Hemoglobin is 13.2.  TSH was 0.62.  A Holter monitor was done.  In general her rate was nicely controlled.  She did have periods when her heart rate was too high.  I may adjust her meds up in the future the cause of this.  She'll was done.  Her ejection fraction now appears to be in the 40% range.  There is an interventricular conduction delay.  She has significant MR and significant TR and there is significant diastolic dysfunction.  There is biatrial enlargement.  She returns today saying that she in fact feels somewhat better.  She does have generalized weakness but is stable.  A chest x-ray had been done on June 19, 2010.  There was a question of an area of some consolidation or loculated pleural effusion or possibly a soft tissue mass.  It was recommended that she have a followup chest x-ray.  When I mention this today the patient's daughter reminds [place] that she had some pulmonary problems and will followup by Dr. Shelle Iron after she came from [place].  Current Medications (verified): 1)  Levothyroxine Sodium 88 Mcg  Tabs (Levothyroxine Sodium) .... One By Mouth Daily 2)  Cartia Xt 120 Mg  Cp24 (Diltiazem Hcl Coated Beads) .... Take One By Mouth Daily 3)  Omeprazole 20 Mg  Tbec (Omeprazole) .... Take One By Mouth Two Times A Day 4)  Warfarin Sodium 5 Mg  Tabs (Warfarin Sodium) .... Take By Mouth As Directed 5)  Meclizine Hcl 25 Mg  Tabs (Meclizine Hcl) .... 1/2-1 By Mouth Three Times A Day As Needed  Dizziness 6)  Bactroban 2 %  Oint (Mupirocin) .... Apply To Affected Area Once Daily As Needed (On Legs) 7)  Furosemide 20 Mg Tabs (Furosemide) .... Take One Tablet By Mouth Daily. 8)  Metoprolol Tartrate 50 Mg Tabs (Metoprolol Tartrate) .... 1/2 Pill By Mouth Two Times A Day 9)  Lutein 6 Mg Caps (Lutein) .... Take 1 Tablet By Mouth Once A Day 10)  Cvs Allergy Relief 10 Mg Tbdp (Loratadine) .... Take 1 Tablet By Mouth Once A Day As Needed 11)  Glucosamine-Chondroitin   Caps (Glucosamine-Chondroit-Vit C-Mn) .... 500mg  Once Daily 12)  Simvastatin 20 Mg Tabs (Simvastatin) .... Take 1/4  Tablet By Mouth Daily At Bedtime 13)  Ferrous Sulfate 325 (65 Fe) Mg Tbec (Ferrous Sulfate) .... Take 1 Tablet By Mouth Once A Day 14)  Vitamin B12 Injection .... Once A Month 15)  Multivitamins  Tabs (Multiple Vitamin) .... Take 1 Tablet By Mouth Once A Day 16)  Calcium Carbonate-Vitamin D 600-400 Mg-Unit  Tabs (Calcium Carbonate-Vitamin D) .... Take 2  Tablets  By Mouth Once A Day 17)  Nitroglycerin 0.4 Mg Subl (Nitroglycerin) .... One Tablet Under Tongue Every 5 Minutes As Needed For Chest Pain---May Repeat Times Three 18)  Tylenol Arthritis Pain 650 Mg Cr-Tabs (Acetaminophen) .... As Needed 19)  Systane 0.4-0.3 % Soln (Polyethyl Glycol-Propyl Glycol) .Marland KitchenMarland KitchenMarland Kitchen  Daily 20)  Flonase 50 Mcg/act Susp (Fluticasone Propionate) .... Two Times A Day As Needed  Allergies (verified): 1)  ! * Lamasil 2)  ! Fosamax 3)  ! Sulfa  Past History:  Past Medical History: afib- since 2004.. meds adjusted for rate control (Sotolol stopped 05/2007) Coumadin therapy  CAD.[name] 2 Cypher stent was at [facility] December 2004 EF 50-55%...echo.Marland KitchenMarland Kitchen6/2008  /   35-40%...echo..06/18/2010 RV dysfunction - mild TR  moderate...echo.Marland KitchenMarland Kitchen6/2008 Pleural effusions July of 08 with pneumonia and fluid overload. Ultimately resolved hypothyroid  hyperlipidemia (tx with statin for CAD) GERD diverticulosis mitral regurgitatio...moderate...echo..02/2007...may be  worse  /   moderate...echo...06/2010 allergies iron deficiency anemia-? cause onchomycosis ? scar on lung (Dr Shelle Iron)- hypoxia  /  chest x-ray abnormality October, 2011 vitamin B12 deficiency  with anemia in the past Prolonged QT interval on sotalol( resolved off sotalol) COPD Volume status Lasix used intermittently over the years osteoarthitis- hands (? remote hx of gout) lumbar compression fracture 9/09 deg changes LS = x ray 9/09 Hypertension osteoporosis- dexa 07 thrombocytopenia  onchomycosis - fingernails  LBBB Shortness of breath  Review of Systems       Patient denies fever, chills, headache, sweats, rash, change in vision, change in hearing, chest pain, cough, nausea vomiting, urinary symptoms.  All of the systems are reviewed and are negative.  Vital Signs:  Patient profile:   77 year old female Height:      67 inches Weight:      122 pounds BMI:     19.18 Pulse rate:   80 / minute BP sitting:   124 / 58  (left arm) Cuff size:   regular  Vitals Entered By: Hardin Negus, RMA (July 09, 2010 11:02 AM)  Physical Exam  General:  patient is stable today. Head:  head is atraumatic. Eyes:  no xanthelasma. Neck:  no jugular venous distention. Chest Wall:  no chest wall tenderness. Lungs:  lungs are clear.  Respiratory effort is nonlabored. Heart:  cardiac exam reveals S1 and S2.  There no clicks.  There is a soft systolic murmur. Abdomen:  abdomen is soft. Msk:  no musculoskeletal deformity. Extremities:  no peripheral edema. Skin:  no skin rashes. Psych:  patient is oriented to person time and place.  Affect is normal.   Impression & Recommendations:  Problem # 1:  * ABNORMAL CHEST X-RAY  The patient has had an abnormal chest x-ray in the past.  She saw Dr.Clance for this.  Her current film was done in [place] and reviewed in [place]. We will arrange for a copy of this film to be obtained.  We will try to coordinate this with old films and they  have been done electronically and older films that may be at [facility] office. I will last for Dr. Stann Mainland the patient to help resolve if any other workup should be done.  Orders: Pulmonary Referral (Pulmonary)  Problem # 2:  SHORTNESS OF BREATH (ICD-786.05)  Her updated medication list for this problem includes:    Cartia Xt 120 Mg Cp24 (Diltiazem hcl coated beads) .[name]... Take one by mouth daily    Furosemide 20 Mg Tabs (Furosemide) .[name]... Take one tablet by mouth daily.    Metoprolol Tartrate 50 Mg Tabs (Metoprolol tartrate) .[name]... 1/2 pill by mouth two times a day This is stable for now.  I have chosen not to change her medicines. However the patient has several issues including EF 40%, atrial fibrillation with a rate elevated at times during the day,  significant mitral regurgitation, significant tricuspid regurgitation, probable restrictive diastolic physiology.  It is very difficult to decide which of these issues to treat specifically.  I believe that overall I should make no changes today.  I will arrange for the followup of her x-ray with Dr. Shelle Iron.  I will then see her back to decide he should make any other changes.  I've chosen not to proceed with any other studies concerning the potassium of 5.4.  Orders: Pulmonary Referral (Pulmonary)  Problem # 3:  LBBB (ICD-426.3)  Her updated medication list for this problem includes:    Cartia Xt 120 Mg Cp24 (Diltiazem hcl coated beads) .[name]... Take one by mouth daily    Warfarin Sodium 5 Mg Tabs (Warfarin sodium) .[name]... Take by mouth as directed    Metoprolol Tartrate 50 Mg Tabs (Metoprolol tartrate) .[name]... 1/2 pill by mouth two times a day    Nitroglycerin 0.4 Mg Subl (Nitroglycerin) ..... One tablet under tongue every 5 minutes as needed for chest pain---may repeat times three This plays a role with the patient's ejection fraction.  Problem # 4:  ATRIAL FIBRILLATION, CHRONIC (ICD-427.31)  Her updated medication list for this problem  includes:    Warfarin Sodium 5 Mg Tabs (Warfarin sodium) .[name]... Take by mouth as directed    Metoprolol Tartrate 50 Mg Tabs (Metoprolol tartrate) .[name]... 1/2 pill by mouth two times a day Atrial fibrillation is under control in general.  However with her Holter monitor there were periods of increased rate.  I will follow this and not change her medicines yet.  Problem # 5:  * EJECTION FRACTION 40% There may have been some decrease in her ejection fraction over time.  I am not convinced that she's had a definit MI.  This  Patient Instructions: 1)  Your physician recommends that you schedule a follow-up appointment in: 4 to 5 weeks.  2)  You have been referred to Pulmonary Dr. Shelle Iron. Pt. has an appointment on Oct, 31st. at 9:45 AM

## 2010-10-14 NOTE — Assessment & Plan Note (Signed)
Summary: LETVAK FLU SHOT/[facility]  Nurse Visit   Allergies: 1)  ! * Lamasil 2)  ! Fosamax 3)  ! Sulfa  Orders Added: 1)  Flu Vaccine 51yrs + MEDICARE PATIENTS [Q2039] 2)  Administration Flu vaccine - MCR [G0008]  Flu Vaccine Consent Questions     Do you have a history of severe allergic reactions to this vaccine? no    Any prior history of allergic reactions to egg and/or gelatin? no    Do you have a sensitivity to the preservative Thimersol? no    Do you have a past history of Guillan-Barre Syndrome? no    Do you currently have an acute febrile illness? no    Have you ever had a severe reaction to latex? no    Vaccine information given and explained to patient? yes    Are you currently pregnant? no    Lot Number:[id number]   Exp Date:03/13/2011   Site Given  Left Deltoid IM

## 2010-10-14 NOTE — Assessment & Plan Note (Signed)
Summary: [name]   Visit Type:  Follow-up Primary Provider:  [name], MD  CC:  atrial fibrillation.  History of Present Illness: The patient is seen for followup of atrial fibrillation.  She is doing very well.  Her volume status is stable.  She's not having chest pain or shortness of breath or syncope.   Current Medications (verified): 1)  Levothyroxine Sodium 88 Mcg  Tabs (Levothyroxine Sodium) .... One By Mouth Qd 2)  Cartia Xt 120 Mg  Cp24 (Diltiazem Hcl Coated Beads) .... Take One By Mouth Daily 3)  Omeprazole 20 Mg  Tbec (Omeprazole) .... Take One By Mouth Two Times A Day 4)  Warfarin Sodium 5 Mg  Tabs (Warfarin Sodium) .... Take By Mouth As Directed 5)  Meclizine Hcl 25 Mg  Tabs (Meclizine Hcl) .... 1/2-1 By Mouth Three Times A Day As Needed Dizziness 6)  Bactroban 2 %  Oint (Mupirocin) .... Apply To Affected Area Once Daily As Needed (On Legs) 7)  Furosemide 20 Mg Tabs (Furosemide) .... Take One Tablet By Mouth Daily. 8)  Metoprolol Tartrate 50 Mg Tabs (Metoprolol Tartrate) .... 1/2 Pill By Mouth Two Times A Day 9)  Lutein 6 Mg Caps (Lutein) .... Take 1 Tablet By Mouth Once A Day 10)  Cvs Allergy Relief 10 Mg Tbdp (Loratadine) .... Take 1 Tablet By Mouth Once A Day As Needed 11)  Glucosamine-Chondroitin   Caps (Glucosamine-Chondroit-Vit C-Mn) .... 500mg  Once Daily 12)  Simvastatin 20 Mg Tabs (Simvastatin) .... Take 1/4  Tablet By Mouth Daily At Bedtime 13)  Ferrous Sulfate 325 (65 Fe) Mg Tbec (Ferrous Sulfate) .... Take 1 Tablet By Mouth Once A Day 14)  Vitamin B12 Injection .... Once A Month 15)  Multivitamins  Tabs (Multiple Vitamin) .... Take 1 Tablet By Mouth Once A Day 16)  Calcium Carbonate-Vitamin D 600-400 Mg-Unit  Tabs (Calcium Carbonate-Vitamin D) .... Take 1 Tablet By Mouth Once A Day 17)  Nitroglycerin 0.4 Mg Subl (Nitroglycerin) .... One Tablet Under Tongue Every 5 Minutes As Needed For Chest Pain---May Repeat Times Three 18)  Cvs Allergy 25 Mg Tabs (Diphenhydramine  Hcl) .... Take 1 Tablet By Mouth Once A Day As Needed 19)  Tylenol Arthritis Pain 650 Mg Cr-Tabs (Acetaminophen) .... As Needed 20)  Systane 0.4-0.3 % Soln (Polyethyl Glycol-Propyl Glycol) .... Daily 21)  Flonase 50 Mcg/act Susp (Fluticasone Propionate) .... Two Times A Day  Allergies: 1)  ! * Lamasil 2)  ! Fosamax 3)  ! Sulfa  Past History:  Past Medical History: Last updated: 12/18/2009 afib- since 2004.. meds adjusted for rate control (Sotolol stopped 05/2007) Coumadin therapy  CAD.[name] 2 Cypher stent was at [facility] December 2004 EF 50-55%...echo.Marland KitchenMarland Kitchen6/2008 RV dysfunction - mild TR  moderate...echo.Marland KitchenMarland Kitchen6/2008 Pleural effusions July of 08 with pneumonia and fluid overload. Ultimately resolved hypothyroid  hyperlipidemia (tx with statin for CAD) GERD diverticulosis mitral regurgitatio...moderate...echo..02/2007...may be worse allergies iron deficiency anemia-? cause onchomycosis ? scar on lung (Dr Shelle Iron)- hypoxia vitamin B12 deficiency  with anemia in the past Prolonged QT interval on sotalol( resolved off sotalol) COPD Volume status Lasix used intermittently over the years osteoarthitis- hands (? remote hx of gout) lumbar compression fracture 9/09 deg changes LS = x ray 9/09 Hypertension osteoporosis- dexa 07 thrombocytopenia  onchomycosis - fingernails  LBBB  Review of Systems       Patient denies fever, chills, headache, sweats, rash, change in vision, change in hearing, chest pain, cough, nausea vomiting, urinary symptoms.  She does have chronic low back pain.  All other systems are reviewed and are negative.  Vital Signs:  Patient profile:   77 year old female Height:      67 inches Weight:      124 pounds Pulse rate:   75 / minute BP sitting:   96 / 58  (left arm) Cuff size:   regular  Vitals Entered By: Hardin Negus, RMA (December 19, 2009 9:42 AM)  Physical Exam  General:  patient looks quite good today. Eyes:  no xanthelasma. Neck:  no jugular venous  distention. Lungs:  lungs are clear.  Respiratory effort is nonlabored. Heart:  cardiac exam reveals S1 and S2.  There are no clicks or significant murmurs.  The rhythm is irregularly irregular but the rate is controlled. Abdomen:  abdomen is soft. Extremities:  no peripheral edema. Psych:  patient is oriented to person time and place.  Affect is normal.   Impression & Recommendations:  Problem # 1:  HYPERTENSION (ICD-401.9)  Her updated medication list for this problem includes:    Cartia Xt 120 Mg Cp24 (Diltiazem hcl coated beads) .[name]... Take one by mouth daily    Furosemide 20 Mg Tabs (Furosemide) .[name]... Take one tablet by mouth daily.    Metoprolol Tartrate 50 Mg Tabs (Metoprolol tartrate) .[name]... 1/2 pill by mouth two times a day Blood pressure is on the low side today.  She's not having any symptoms.  No change in meds.  Problem # 2:  FLUID OVERLOAD (ICD-276.6) Volume status is quite stable.  No change in therapy.  We will check chemistry lab.  Problem # 3:  ATRIAL FIBRILLATION, CHRONIC (ICD-427.31)  Her updated medication list for this problem includes:    Warfarin Sodium 5 Mg Tabs (Warfarin sodium) .[name]... Take by mouth as directed    Metoprolol Tartrate 50 Mg Tabs (Metoprolol tartrate) .[name]... 1/2 pill by mouth two times a day Atrial fibrillation controlled.  No change in therapy.  She continues on Coumadin.  Orders: TLB-BMP (Basic Metabolic Panel-BMET) (80048-METABOL)  Patient Instructions: 1)  Your physician recommends that you schedule a follow-up appointment in: 6 months 2)  Your physician recommends that you have lab work today: BMET 276.6

## 2010-10-14 NOTE — Assessment & Plan Note (Signed)
Summary: 4-5 [email or web address]   Visit Type:  Follow-up Referring Provider:  Willa Rough MD Primary Provider:  [name], MD  CC:   atrial fibrillation.  History of Present Illness:  The patient is seen for followup of atrial fibrillation and shortness of breath.  I saw her last July 09, 2010.  I reviewed the issues of her shortness of breath, atrial fibrillation rate, ejection fraction of 40%, and her abnormal chest x-ray.  I decided not to change any of her medications.  She's had very careful evaluation by Dr. Shelle Iron.  She is aware that the spot on her lung could represent infection, inflammation, fluid, or cancer.  She is scheduled to have a followup chest x-ray and a visit with him early next week.  He did give her a course of antibiotics.  Overall she does feel better and she is walking better.  Current Medications (verified): 1)  Levothyroxine Sodium 88 Mcg  Tabs (Levothyroxine Sodium) .... One By Mouth Daily 2)  Cartia Xt 120 Mg  Cp24 (Diltiazem Hcl Coated Beads) .... Take One By Mouth Daily 3)  Omeprazole 20 Mg  Tbec (Omeprazole) .... Take One By Mouth Two Times A Day 4)  Warfarin Sodium 5 Mg  Tabs (Warfarin Sodium) .... Take By Mouth As Directed 5)  Meclizine Hcl 25 Mg  Tabs (Meclizine Hcl) .... 1/2-1 By Mouth Three Times A Day As Needed Dizziness 6)  Bactroban 2 %  Oint (Mupirocin) .... Apply To Affected Area Once Daily As Needed (On Legs) 7)  Furosemide 20 Mg Tabs (Furosemide) .... Take One Tablet By Mouth Daily. 8)  Metoprolol Tartrate 50 Mg Tabs (Metoprolol Tartrate) .... 1/2 Pill By Mouth Two Times A Day 9)  Lutein 6 Mg Caps (Lutein) .... Take 1 Tablet By Mouth Once A Day 10)  Cvs Allergy Relief 10 Mg Tbdp (Loratadine) .... Take 1 Tablet By Mouth Once A Day As Needed 11)  Glucosamine-Chondroitin   Caps (Glucosamine-Chondroit-Vit C-Mn) .... 500mg  Once Daily 12)  Simvastatin 20 Mg Tabs (Simvastatin) .... Take 1/4  Tablet By Mouth Daily At Bedtime 13)  Ferrous Sulfate 325 (65  Fe) Mg Tbec (Ferrous Sulfate) .... Take 1 Tablet By Mouth Once A Day 14)  Vitamin B12 Injection .... Once A Month 15)  Multivitamins  Tabs (Multiple Vitamin) .... Take 1 Tablet By Mouth Once A Day 16)  Calcium Carbonate-Vitamin D 600-400 Mg-Unit  Tabs (Calcium Carbonate-Vitamin D) .... Take 2  Tablets  By Mouth Once A Day 17)  Nitroglycerin 0.4 Mg Subl (Nitroglycerin) .... One Tablet Under Tongue Every 5 Minutes As Needed For Chest Pain---May Repeat Times Three 18)  Tylenol Arthritis Pain 650 Mg Cr-Tabs (Acetaminophen) .... As Needed 19)  Systane 0.4-0.3 % Soln (Polyethyl Glycol-Propyl Glycol) .... Daily 20)  Flonase 50 Mcg/act Susp (Fluticasone Propionate) .... Two Times A Day As Needed  Allergies (verified): 1)  ! * Lamasil 2)  ! Fosamax 3)  ! Sulfa  Past History:  Past Medical History: afib- since 2004.. meds adjusted for rate control (Sotolol stopped 05/2007) Coumadin therapy  CAD.[name] 2 Cypher stent was at [facility] December 2004 EF 50-55%...echo.Marland KitchenMarland Kitchen6/2008  /   35-40%...echo..06/18/2010 RV dysfunction - mild TR  moderate...echo.Marland KitchenMarland Kitchen6/2008 Pleural effusions July of 08 with pneumonia and fluid overload. Ultimately resolved hypothyroid  hyperlipidemia (tx with statin for CAD) GERD diverticulosis mitral regurgitatio...moderate...echo..02/2007...may be worse  /   moderate...echo...06/2010 allergies iron deficiency anemia-? cause onchomycosis ? scar on lung (Dr Shelle Iron)- hypoxia  /  chest x-ray abnormality October, 2011 vitamin  B12 deficiency  with anemia in the past Prolonged QT interval on sotalol( resolved off sotalol).. COPD Volume status Lasix used intermittently over the years osteoarthitis- hands (? remote hx of gout) lumbar compression fracture 9/09 deg changes LS = x ray 9/09 Hypertension osteoporosis- dexa 07 thrombocytopenia  onchomycosis - fingernails  LBBB Shortness of breath  Review of Systems       Patient denies fever, chills, headache, sweats, rash, change in vision,  change in hearing, chest pain, cough vomiting, urinary symptoms.  All other systems are reviewed and are negative.  Vital Signs:  Patient profile:   77 year old female Height:      67 inches Weight:      123 pounds BMI:     19.33 Pulse rate:   70 / minute BP sitting:   134 / 64  (left arm) Cuff size:   regular  Vitals Entered By: Hardin Negus, RMA (August 04, 2010 8:58 AM)  Physical Exam  General:  patient looks good. Eyes:  no xanthelasma. Neck:  no jugular venous distention Lungs:  lungs reveal a few scattered rhonchi.  There is no respiratory distress. Heart:  cardiac exam reveals S1-S2.  There is a soft systolic murmur. Abdomen:  abdomen soft. Extremities:  no peripheral edema. Psych:  patient is oriented to person time and place.  Affect is normal.  She is here with her daughter as always.   Impression & Recommendations:  Problem # 1:  ABNORMAL CHEST XRAY (ICD-793.1) History she has been fully evaluated by Dr. Danie Chandler.  Problem # 2:  SHORTNESS OF BREATH (ICD-786.05)  Her updated medication list for this problem includes:    Cartia Xt 120 Mg Cp24 (Diltiazem hcl coated beads) .[name]... Take one by mouth daily    Furosemide 20 Mg Tabs (Furosemide) .[name]... Take one tablet by mouth daily.    Metoprolol Tartrate 50 Mg Tabs (Metoprolol tartrate) .[name]... 1/2 pill by mouth two times a day Shortness of breath is somewhat better.  No therapy. Her renal function was stable when checked on July 07, 2010. her potassium had normalized.  She does not need any labs today.  Problem # 3:  HYPERTENSION (ICD-401.9)  Her updated medication list for this problem includes:    Cartia Xt 120 Mg Cp24 (Diltiazem hcl coated beads) .[name]... Take one by mouth daily    Furosemide 20 Mg Tabs (Furosemide) .[name]... Take one tablet by mouth daily.    Metoprolol Tartrate 50 Mg Tabs (Metoprolol tartrate) .[name]... 1/2 pill by mouth two times a day Blood pressure is controlled.  No change in therapy.  Appended  Document: instructions    Clinical Lists Changes  Observations: Added new observation of PI CARDIO: Follow up in 3 months (08/04/2010 9:33)       Patient Instructions: 1)  Follow up in 3 months

## 2010-10-14 NOTE — Procedures (Signed)
Summary: Summary Report  Summary Report   Imported By: Erle Crocker 07/31/2010 16:13:00  _____________________________________________________________________  External Attachment:    Type:   Image     Comment:   External Document
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
## 2010-10-14 NOTE — Miscellaneous (Signed)
Summary: Orders Update  Clinical Lists Changes  Orders: Added new Test order of T-2 View CXR (71020TC) - Signed 

## 2010-10-14 NOTE — Progress Notes (Signed)
Summary: Pt has seen Dr. Myrtis Ser and Dr. Shelle Iron  Phone Note Outgoing Call Call back at [phone]   Call placed by: Lewanda Rife LPN Call placed to: Suan Halter Summary of Call: I called Jamesetta So about pt's lab results and Jamesetta So asked me to mail her a copy and wanted to let Dr Milinda Antis know that pt has seen Dr Myrtis Ser and Dr. Shelle Iron and will be rechecked. Pt has 1 of 4 dx. and is presently on antibiotic. Jamesetta So will call back when knows something more definite but Jamesetta So wanted Dr Milinda Antis to know fyi. Initial call taken by: Lewanda Rife LPN,  July 13, 2010 2:10 PM

## 2010-10-14 NOTE — Assessment & Plan Note (Signed)
Summary: [facility]/B-12/JRR  Nurse Visit   Allergies: 1)  ! * Lamasil 2)  ! Fosamax 3)  ! Sulfa  Medication Administration  Injection # 1:    Medication: Vit B12 1000 mcg    Diagnosis: ANEMIA, B12 DEFICIENCY (ICD-281.1)    Route: IM    Site: L deltoid    Exp Date: 03/13/2012    Lot #: [id number]    Mfr: [employer]    Patient tolerated injection without complications    Given by: Benny Lennert CMA (AAMA) (August 04, 2010 11:08 AM)  Orders Added: 1)  Vit B12 1000 mcg [J3420] 2)  Admin of Therapeutic Inj  intramuscular or subcutaneous [16109]

## 2010-10-14 NOTE — Assessment & Plan Note (Signed)
Summary: f41m   Visit Type:  Follow-up Referring Provider:  [facility] Primary Provider:  [name], MD  CC:  shortness of breath.  History of Present Illness: The patient is seen for followup shortness of breath.  I saw her last October, 2011.  She does not have any chest pain.  She does have coronary disease.  Originally she underwent a Cypher stent at [facility] in December, 2004.  She has not had cardiac catheterization since then.  Her LV function has been good but she does have moderate tricuspid regurgitation.  Her atrial fib rate has been watched over time.  She has had volume overload but is now under control.  Despite this she is having significant exertional shortness of breath.  She become short of breath when walking only short distances.  There's been no syncope or presyncope. This  Current Medications (verified): 1)  Levothyroxine Sodium 88 Mcg  Tabs (Levothyroxine Sodium) .... One By Mouth Qd 2)  Cartia Xt 120 Mg  Cp24 (Diltiazem Hcl Coated Beads) .... Take One By Mouth Daily 3)  Omeprazole 20 Mg  Tbec (Omeprazole) .... Take One By Mouth Two Times A Day 4)  Warfarin Sodium 5 Mg  Tabs (Warfarin Sodium) .... Take By Mouth As Directed 5)  Meclizine Hcl 25 Mg  Tabs (Meclizine Hcl) .... 1/2-1 By Mouth Three Times A Day As Needed Dizziness 6)  Bactroban 2 %  Oint (Mupirocin) .... Apply To Affected Area Once Daily As Needed (On Legs) 7)  Furosemide 20 Mg Tabs (Furosemide) .... Take One Tablet By Mouth Daily. 8)  Metoprolol Tartrate 50 Mg Tabs (Metoprolol Tartrate) .... 1/2 Pill By Mouth Two Times A Day 9)  Lutein 6 Mg Caps (Lutein) .... Take 1 Tablet By Mouth Once A Day 10)  Cvs Allergy Relief 10 Mg Tbdp (Loratadine) .... Take 1 Tablet By Mouth Once A Day As Needed 11)  Glucosamine-Chondroitin   Caps (Glucosamine-Chondroit-Vit C-Mn) .... 500mg  Once Daily 12)  Simvastatin 20 Mg Tabs (Simvastatin) .... Take 1/4  Tablet By Mouth Daily At Bedtime 13)  Ferrous Sulfate 325 (65 Fe) Mg Tbec (Ferrous  Sulfate) .... Take 1 Tablet By Mouth Once A Day 14)  Vitamin B12 Injection .... Once A Month 15)  Multivitamins  Tabs (Multiple Vitamin) .... Take 1 Tablet By Mouth Once A Day 16)  Calcium Carbonate-Vitamin D 600-400 Mg-Unit  Tabs (Calcium Carbonate-Vitamin D) .... Take 1 Tablet By Mouth Once A Day 17)  Nitroglycerin 0.4 Mg Subl (Nitroglycerin) .... One Tablet Under Tongue Every 5 Minutes As Needed For Chest Pain---May Repeat Times Three 18)  Cvs Allergy 25 Mg Tabs (Diphenhydramine Hcl) .... Take 1 Tablet By Mouth Once A Day As Needed 19)  Tylenol Arthritis Pain 650 Mg Cr-Tabs (Acetaminophen) .... As Needed 20)  Systane 0.4-0.3 % Soln (Polyethyl Glycol-Propyl Glycol) .... Daily 21)  Flonase 50 Mcg/act Susp (Fluticasone Propionate) .... Two Times A Day  Allergies (verified): 1)  ! * Lamasil 2)  ! Fosamax 3)  ! Sulfa  Past History:  Past Medical History: afib- since 2004.. meds adjusted for rate control (Sotolol stopped 05/2007) Coumadin therapy  CAD.[name] 2 Cypher stent was at [facility] December 2004 EF 50-55%...echo.Marland KitchenMarland Kitchen6/2008 RV dysfunction - mild TR  moderate...echo.Marland KitchenMarland Kitchen6/2008 Pleural effusions July of 08 with pneumonia and fluid overload. Ultimately resolved hypothyroid  hyperlipidemia (tx with statin for CAD) GERD diverticulosis mitral regurgitatio...moderate...echo..02/2007...may be worse allergies iron deficiency anemia-? cause onchomycosis ? scar on lung (Dr Shelle Iron)- hypoxia vitamin B12 deficiency  with anemia in the  past Prolonged QT interval on sotalol( resolved off sotalol) COPD Volume status Lasix used intermittently over the years osteoarthitis- hands (? remote hx of gout) lumbar compression fracture 9/09 deg changes LS = x ray 9/09 Hypertension osteoporosis- dexa 07 thrombocytopenia  onchomycosis - fingernails  LBBB Shortness of breath  Review of Systems       Patient denies fever, chills, headache, sweats, rash, change in vision, change in hearing, chest pain, cough,  nausea vomiting, urinary symptoms.  All other systems are reviewed and are negative.  Vital Signs:  Patient profile:   77 year old female Height:      67 inches Weight:      120 pounds BMI:     18.86 Pulse rate:   73 / minute BP sitting:   120 / 80  (left arm) Cuff size:   regular  Vitals Entered By: Hardin Negus, RMA (June 18, 2010 9:53 AM)  Physical Exam  General:  The patient is thin but stable. Head:  head is atraumatic. Eyes:  no xanthelasma. Neck:  no jugular distention. Chest Wall:  no chest wall tenderness. Lungs:  lungs are clear.  Respiratory effort is nonlabored. Heart:  cardiac exam reveals S1-S2.  There are no clicks or significant murmurs.  The rhythm is irregularly irregular. Abdomen:  abdomen is soft. Msk:  no musculoskeletal deformities. Extremities:  no peripheral edema. Skin:  no skin rashes. Psych:  patient is oriented to person time and place.  Affect is normal.   Impression & Recommendations:  Problem # 1:  SHORTNESS OF BREATH (ICD-786.05)  Her updated medication list for this problem includes:    Cartia Xt 120 Mg Cp24 (Diltiazem hcl coated beads) .[name]... Take one by mouth daily    Furosemide 20 Mg Tabs (Furosemide) .[name]... Take one tablet by mouth daily.    Metoprolol Tartrate 50 Mg Tabs (Metoprolol tartrate) .[name]... 1/2 pill by mouth two times a day The etiology of her shortness of breath is not completely clear at this time.  She is 77 years of age.  She has had good LV function in the past but she does have some valvular disease.  Her atrial fibrillation rate could be out of control.  It is stable with rest.  The patient had a 24-hour monitor in October 2 009.  That did show that she had some increased rates with exercise.  I did adjust her medicines and she may need further adjustment.  We will have her wear a 24-hour monitor to get a better feel for her heart rate.  She'll also have a followup 2-D echo to reassess LV and RV function pulmonary pressures  and valvular abnormalities. Chest x-ray will be done.  Orders: TLB-BMP (Basic Metabolic Panel-BMET) (80048-METABOL) TLB-CBC Platelet - w/Differential (85025-CBCD) TLB-TSH (Thyroid Stimulating Hormone) (84443-TSH) T-2 View CXR (71020TC) Echocardiogram (Echo)  Problem # 2:  CORONARY ATHEROSCLEROSIS NATIVE CORONARY ARTERY (ICD-414.01)  Her updated medication list for this problem includes:    Cartia Xt 120 Mg Cp24 (Diltiazem hcl coated beads) .[name]... Take one by mouth daily    Warfarin Sodium 5 Mg Tabs (Warfarin sodium) .[name]... Take by mouth as directed    Metoprolol Tartrate 50 Mg Tabs (Metoprolol tartrate) .[name]... 1/2 pill by mouth two times a day    Nitroglycerin 0.4 Mg Subl (Nitroglycerin) ..... One tablet under tongue every 5 minutes as needed for chest pain---may repeat times three Coronary disease is stable.  EKG reveals old left bundle branch block.  EKG is done today  and reviewed by me.  It is possible that she could have coronary ischemia.  She says that she currently feels like she did before her stents in 2004.  I more inclined to assess her heart rate and her valvular and left ventricular right ventricular function first before considering any kind of exercise testing.  Orders: TLB-BMP (Basic Metabolic Panel-BMET) (80048-METABOL) TLB-CBC Platelet - w/Differential (85025-CBCD) TLB-TSH (Thyroid Stimulating Hormone) (84443-TSH) Echocardiogram (Echo)  Problem # 3:  HYPERTENSION (ICD-401.9)  Her updated medication list for this problem includes:    Cartia Xt 120 Mg Cp24 (Diltiazem hcl coated beads) .[name]... Take one by mouth daily    Furosemide 20 Mg Tabs (Furosemide) .[name]... Take one tablet by mouth daily.    Metoprolol Tartrate 50 Mg Tabs (Metoprolol tartrate) .[name]... 1/2 pill by mouth two times a day Blood pressure control.  No change in therapy.  Problem # 4:  FLUID OVERLOAD (ICD-276.6) Her fluid status is stable.  I am not inclined to push her fluids meds and he ordered this  point.  Problem # 5:  HYPOTHYROIDISM (ICD-244.9)  Her updated medication list for this problem includes:    Levothyroxine Sodium 88 Mcg Tabs (Levothyroxine sodium) ..... One by mouth qd There is a history of hypothyroidism.  I have looked at I do not see a recent TSH.  This will be obtained.  Hemoglobin done in August was in the normal range.  Other Orders: EKG w/ Interpretation (93000) Holter (Holter)  Patient Instructions: 1)  Your physician recommends that you schedule a follow-up appointment in: 2-3 weeks 2)  Your physician recommends that you continue on your current medications as directed. Please refer to the Current Medication list given to you today. 3)  Your physician has requested that you have an echocardiogram.  Echocardiography is a painless test that uses sound waves to create images of your heart. It provides your doctor with information about the size and shape of your heart and how well your heart's chambers and valves are working.  This procedure takes approximately one hour. There are no restrictions for this procedure. 4)  Your physician has recommended that you wear a holter monitor.  Holter monitors are medical devices that record the heart's electrical activity. Doctors most often use these monitors to diagnose arrhythmias. Arrhythmias are problems with the speed or rhythm of the heartbeat. The monitor is a small, portable device. You can wear one while you do your normal daily activities. This is usually used to diagnose what is causing palpitations/syncope (passing out).

## 2010-10-14 NOTE — Procedures (Signed)
Summary: summary report  summary report   Imported By: Mirna Mires 07/01/2010 10:09:29  _____________________________________________________________________  External Attachment:    Type:   Image     Comment:   External Document
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
## 2010-10-14 NOTE — Progress Notes (Signed)
Summary: ??about yearly exam  Phone Note Call from Patient Call back at [phone]   Caller: Patient Call For: [name] Part MD Summary of Call: [facility] and just received a letter from them stating that it was time for her to schedule her yearly exam and the daughter is asking if she needs to go there for the visit since all the problems that she was having at the time have resolved and just have her yearly with you. Please advise.  Initial call taken by: Melody Comas,  April 14, 2010 1:58 PM  Follow-up for Phone Call        I need to review last 2 notes from them and then decide please send for those and send this back to me when they arrive Follow-up by: [name] Part MD,  April 14, 2010 2:03 PM  Additional Follow-up for Phone Call Additional follow up Details #1::        [facility] switchboard closes at 4:30pm. Will try to call 04/15/10.Lewanda Rife LPN  April 14, 2010 5:08 PM    Spoke with [facility] at [facility] and they need a signed release form before they can release information. Pt has only been seen in their office one time. Family can go online at [email or web address] under forms and get a medical release from their office or sign release here at our office and then [facility] will release the one visit. Left message for patient's daughter [name]  to call back. Lewanda Rife LPN  April 15, 2010 9:49 AM     Additional Follow-up for Phone Call Additional follow up Details #2::    Patient's daughter [name] notified as instructed by telephone.  A completed release form is at front desk for pt to sign when she comes for lab appt on 04/21/10 at 10:05am.Rena [facility] LPN  April 15, 2010 10:12 AM   Additional Follow-up for Phone Call Additional follow up Details #3:: Details for Additional Follow-up Action Taken: Zella Ball at front desk got pt to sign release form and faxed form to [facility].Lewanda Rife LPN  April 17, 2010 1:40 PM   I spoke with Shanda Bumps in Med records at [facility] and she verified they did receive our fax med record release. Since we are having problems with our fax machine I gave Shanda Bumps the alternate fax # [phone].Lewanda Rife LPN  April 17, 2010 2:23 PM   thanks - when records come in - send this back to me-- MT   Still have not received record. spoke with Marchelle Folks in Medical records at [facility], she will fax now.Lewanda Rife LPN  April 23, 2010 12:15 PM   thank you- I reviewed the notes I am waiting on her endometrial bx results and then I can make a plan  please send this back to me when those arrive  Additional Follow-up by: [name] Part MD,  April 21, 2010 12:03 PM  Received fax from [facility] and notes are on your shelf in the inbox. Thank you.Lewanda Rife LPN  April 23, 2010 3:25 PM   I have everything I need except endometrial bx result -- please ask for that (it was not sent-- I got the pap but not the endometrial bx report)-- once I see that result I will be able to adv pt what to do thanks   I got  it !- thanks-- please let pt know she can come to me for her annual exam with me   Left message for patient to call back. Lewanda Rife LPN  April 28, 2010 12:01 PM   CPX scheduled for october 25th. [name] wants labs done same day as cpx because they drive [place] far and she doesn't wants to make 2 trips. Melody Comas  April 29, 2010 10:52 AM

## 2010-10-14 NOTE — Letter (Signed)
Summary: [facility]  [facility] [place]   Imported By: Maryln Gottron 04/24/2010 13:28:59  _____________________________________________________________________  External Attachment:    Type:   Image     Comment:   External Document  Appended Document: [facility] [place] I need endometrial bx results and recommendations from this visit last fall - could you please send for that -- I'm trying to decide about follow up   Appended Document: [facility] [place] [facility] at [facility] will fax endometrial bx now to my attention.  Appended Document: [facility] [place] Report for endometrial bx is on your shelf in the in box. Thank you.  Appended Document: [facility] [place] thanks - I reviewed it  she can f/u with me instead of gyn for annual exam   Appended Document: [facility] [place] Patient's daughter Jamesetta So notified as instructed by telephone. Jamesetta So will call back to schedule appt with Dr Milinda Antis.

## 2010-10-14 NOTE — Assessment & Plan Note (Signed)
Summary: LETVAK B12/RBH  Nurse Visit   Allergies: 1)  ! * Lamasil 2)  ! Fosamax 3)  ! Sulfa  Medication Administration  Injection # 1:    Medication: Vit B12 1000 mcg    Diagnosis: ANEMIA, B12 DEFICIENCY (ICD-281.1)    Route: IM    Site: L deltoid    Exp Date: 07/15/2011    Lot #: [id number]    Mfr: American Regent    Comments: Patient stated today that she missed her last B12 injection.    Patient tolerated injection without complications    Given by: Linde Gillis CMA Duncan Dull) (December 19, 2009 10:59 AM)  Orders Added: 1)  Vit B12 1000 mcg [J3420] 2)  Admin of Therapeutic Inj  intramuscular or subcutaneous [78295]

## 2010-10-14 NOTE — Miscellaneous (Signed)
  Clinical Lists Changes  Observations: Added new observation of PAST MED HX: afib- since 2004.. meds adjusted for rate control (Sotolol stopped 05/2007) Coumadin therapy  CAD.[name] 2 Cypher stent was at [facility] December 2004 EF 50-55%...echo.Marland KitchenMarland Kitchen6/2008 RV dysfunction - mild TR  moderate...echo.Marland KitchenMarland Kitchen6/2008 Pleural effusions July of 08 with pneumonia and fluid overload. Ultimately resolved hypothyroid  hyperlipidemia (tx with statin for CAD) GERD diverticulosis mitral regurgitatio...moderate...echo..02/2007...may be worse allergies iron deficiency anemia-? cause onchomycosis ? scar on lung (Dr Shelle Iron)- hypoxia vitamin B12 deficiency  with anemia in the past Prolonged QT interval on sotalol( resolved off sotalol) COPD Volume status Lasix used intermittently over the years osteoarthitis- hands (? remote hx of gout) lumbar compression fracture 9/09 deg changes LS = x ray 9/09 Hypertension osteoporosis- dexa 07 thrombocytopenia  onchomycosis - fingernails  LBBB (12/18/2009 7:26) Added new observation of REFERRING MD: Tower (12/18/2009 7:26) Added new observation of PRIMARY MD: [name], MD (12/18/2009 7:26)       Past History:  Past Medical History: afib- since 2004.. meds adjusted for rate control (Sotolol stopped 05/2007) Coumadin therapy  CAD.[name] 2 Cypher stent was at [facility] December 2004 EF 50-55%...echo.Marland KitchenMarland Kitchen6/2008 RV dysfunction - mild TR  moderate...echo.Marland KitchenMarland Kitchen6/2008 Pleural effusions July of 08 with pneumonia and fluid overload. Ultimately resolved hypothyroid  hyperlipidemia (tx with statin for CAD) GERD diverticulosis mitral regurgitatio...moderate...echo..02/2007...may be worse allergies iron deficiency anemia-? cause onchomycosis ? scar on lung (Dr Shelle Iron)- hypoxia vitamin B12 deficiency  with anemia in the past Prolonged QT interval on sotalol( resolved off sotalol) COPD Volume status Lasix used intermittently over the years osteoarthitis- hands (? remote hx of  gout) lumbar compression fracture 9/09 deg changes LS = x ray 9/09 Hypertension osteoporosis- dexa 07 thrombocytopenia  onchomycosis - fingernails  LBBB

## 2010-10-15 NOTE — Assessment & Plan Note (Signed)
Summary: B-12/Essa Malachi/JRR  Nurse Visit   Allergies: 1)  ! * Lamasil 2)  ! Fosamax 3)  ! Sulfa  Medication Administration  Injection # 1:    Medication: Vit B12 1000 mcg    Diagnosis: ANEMIA, B12 DEFICIENCY (ICD-281.1)    Route: IM    Site: L deltoid    Exp Date: 06/13/2012    Lot #: [id number]    Mfr: American Regent    Patient tolerated injection without complications    Given by: Lewanda Rife LPN (September 25, 2010 10:47 AM)  Orders Added: 1)  Vit B12 1000 mcg [J3420] 2)  Admin of Therapeutic Inj  intramuscular or subcutaneous [86578]

## 2010-10-15 NOTE — Progress Notes (Signed)
Summary: Chest x-ray results  Phone Note Call from Patient Call back at cell [phone]   Caller: Daughter Fernand Parkins Reason for Call: Talk to Doctor Summary of Call: Wants results from chest x-ray done yesterday. Initial call taken by: Leonette Monarch,  September 09, 2010 12:18 PM  Follow-up for Phone Call        Daughter calling again for results of pts ct chest.  I called her reminded her that [facility] just returned to office this am and is seeing pt's at this time, but once the msg is addressed we will call her back.  She verbalized understanding. Follow-up by: Vernie Murders,  September 09, 2010 12:25 PM  Additional Follow-up for Phone Call Additional follow up Details #1::        please let pt and family know the right lung spot that we were worried about has not changed by ct scan since 2009...great news.  no further evaluation needed. Additional Follow-up by: Barbaraann Share MD,  September 09, 2010 6:09 PM    Additional Follow-up for Phone Call Additional follow up Details #2::    Pt's daughter is aware that the spot on pt's right lung has not changed since 2009, which is great news and no further evaluation is needed at this time. Daughter will relay msg to the patient.Michel Bickers [facility]  September 10, 2010 9:24 AM

## 2010-10-15 NOTE — Progress Notes (Signed)
Summary: results of chest xray  Phone Note Call from Patient Call back at Home Phone [phone]   Caller: daughter/Phylis Call For: Dr. Shelle Iron Summary of Call: Pt daughter Phylis phoned her mother had a chest xray this morning and they wanted to know if Dr Shelle Iron had received the results. Phylis can be reached at [phone] Initial call taken by: Vedia Coffer,  September 08, 2010 3:13 PM  Follow-up for Phone Call        Advised phyllis that [facility] out of office until tomorrow but I will forward message to him to et him know that they are requesting results. Please advise. Carron Curie CMA  September 08, 2010 4:05 PM   Additional Follow-up for Phone Call Additional follow up Details #1::        see next message Additional Follow-up by: Barbaraann Share MD,  September 09, 2010 6:08 PM

## 2010-11-04 ENCOUNTER — Encounter: Payer: Self-pay | Admitting: Cardiology

## 2010-11-06 ENCOUNTER — Encounter: Payer: Self-pay | Admitting: Family Medicine

## 2010-11-06 ENCOUNTER — Other Ambulatory Visit: Payer: Self-pay

## 2010-11-06 ENCOUNTER — Ambulatory Visit (INDEPENDENT_AMBULATORY_CARE_PROVIDER_SITE_OTHER): Payer: Medicare Other | Admitting: Cardiology

## 2010-11-06 ENCOUNTER — Encounter: Payer: Self-pay | Admitting: Cardiology

## 2010-11-06 ENCOUNTER — Ambulatory Visit (INDEPENDENT_AMBULATORY_CARE_PROVIDER_SITE_OTHER): Payer: Medicare Other

## 2010-11-06 DIAGNOSIS — Z5181 Encounter for therapeutic drug level monitoring: Secondary | ICD-10-CM

## 2010-11-06 DIAGNOSIS — Z7901 Long term (current) use of anticoagulants: Secondary | ICD-10-CM

## 2010-11-06 DIAGNOSIS — D518 Other vitamin B12 deficiency anemias: Secondary | ICD-10-CM

## 2010-11-06 DIAGNOSIS — I4891 Unspecified atrial fibrillation: Secondary | ICD-10-CM

## 2010-11-10 NOTE — Assessment & Plan Note (Signed)
Summary: follow up/per ck out/sf/KL   Visit Type:  Follow-up Primary Provider:  [name], MD  CC:  atrial fibrillation.  History of Present Illness: The patient is seen for cardiology followup.  She did very well.  I saw her last November, 2011.  Since that time she has had a followup CT scan showing that her chest findings are unchanged.  She had worn a Holter monitor in October, 2011.  She had no bradycardia.  She had brief episodes of increased heart rate.  These were limited and I chose not to change her medicines.  She has some weakness at times.  She does not have syncope or presyncope.  She goes about full activities.  Anticoagulation Management History:      Positive risk factors for bleeding include an age of 90 years or older.  The bleeding index is 'intermediate risk'.  Positive CHADS2 values include History of HTN and Age > 39 years old.  Her last INR was 2.3.     Current Medications (verified): 1)  Levothyroxine Sodium 88 Mcg  Tabs (Levothyroxine Sodium) .... One By Mouth Daily 2)  Cartia Xt 120 Mg  Cp24 (Diltiazem Hcl Coated Beads) .... Take One By Mouth Daily 3)  Omeprazole 20 Mg  Tbec (Omeprazole) .... Take One By Mouth Two Times A Day 4)  Warfarin Sodium 5 Mg  Tabs (Warfarin Sodium) .... Take By Mouth As Directed 5)  Meclizine Hcl 25 Mg  Tabs (Meclizine Hcl) .... 1/2-1 By Mouth Three Times A Day As Needed Dizziness 6)  Bactroban 2 %  Oint (Mupirocin) .... Apply To Affected Area Once Daily As Needed (On Legs) 7)  Furosemide 20 Mg Tabs (Furosemide) .... Take One Tablet By Mouth Daily. 8)  Metoprolol Tartrate 50 Mg Tabs (Metoprolol Tartrate) .... 1/2 Pill By Mouth Two Times A Day 9)  Lutein 6 Mg Caps (Lutein) .... Take 1 Tablet By Mouth Once A Day 10)  [facility] Allergy Relief 10 Mg Tbdp (Loratadine) .... Take 1 Tablet By Mouth Once A Day As Needed 11)  Glucosamine-Chondroitin   Caps (Glucosamine-Chondroit-Vit C-Mn) .... 500mg  Once Daily 12)  Simvastatin 20 Mg Tabs  (Simvastatin) .... Take 1/4  Tablet By Mouth Daily At Bedtime 13)  Ferrous Sulfate 325 (65 Fe) Mg Tbec (Ferrous Sulfate) .... Take 1 Tablet By Mouth Once A Day 14)  Vitamin B12 Injection .... Once A Month 15)  Multivitamins  Tabs (Multiple Vitamin) .... Take 1 Tablet By Mouth Once A Day 16)  Calcium Carbonate-Vitamin D 600-400 Mg-Unit  Tabs (Calcium Carbonate-Vitamin D) .... Take 2  Tablets  By Mouth Once A Day 17)  Nitroglycerin 0.4 Mg Subl (Nitroglycerin) .... One Tablet Under Tongue Every 5 Minutes As Needed For Chest Pain---May Repeat Times Three 18)  Tylenol Arthritis Pain 650 Mg Cr-Tabs (Acetaminophen) .... As Needed 19)  Systane 0.4-0.3 % Soln (Polyethyl Glycol-Propyl Glycol) .... Daily 20)  Flonase 50 Mcg/act Susp (Fluticasone Propionate) .... Two Times A Day As Needed  Allergies (verified): 1)  ! * Lamasil 2)  ! Fosamax 3)  ! Sulfa  Past History:  Past Medical History: afib- since 2004.. meds adjusted for rate control (Sotolol stopped 05/2007)  Holter... October, 2011... no bradycardia... brief episodes of tachycardia.... no change in meds Coumadin therapy  CAD.[name] 2 Cypher stent was at [facility] December 2004 EF 50-55%...echo.Marland KitchenMarland Kitchen6/2008  /   35-40%...echo..06/18/2010 RV dysfunction - mild TR  moderate...echo.Marland KitchenMarland Kitchen6/2008 Pleural effusions July of 08 with pneumonia and fluid overload. Ultimately resolved hypothyroid  hyperlipidemia (  tx with statin for CAD) GERD diverticulosis mitral regurgitatio...moderate...echo..02/2007...may be worse  /   moderate...echo...06/2010 allergies iron deficiency anemia-? cause onchomycosis ? scar on lung (Dr Shelle Iron)- hypoxia  /  chest x-ray abnormality October, 2011 /  08/2011 CT chest no change..no further w/u needed. vitamin B12 deficiency  with anemia in the past Prolonged QT interval on sotalol( resolved off sotalol).. COPD Volume status Lasix used intermittently over the years osteoarthitis- hands (? remote hx of gout) lumbar compression fracture  9/09 deg changes LS = x ray 9/09 Hypertension osteoporosis- dexa 07 thrombocytopenia  onchomycosis - fingernails  LBBB Shortness of breath  Review of Systems       Patient denies fever, chills, headache, sweats, rash, change in vision, change in hearing, chest pain, cough, nausea vomiting, urinary symptoms.  All other systems are reviewed and are negative.  Vital Signs:  Patient profile:   77 year old female Height:      67 inches Weight:      124 pounds BMI:     19.49 Pulse rate:   70 / minute BP sitting:   110 / 56  (left arm) Cuff size:   regular  Vitals Entered By: Hardin Negus, RMA (November 06, 2010 9:12 AM)  Physical Exam  General:  patient is quite stable today. Eyes:  no xanthelasma. Neck:  no jugular venous distention. Lungs:  lungs are clear.  Respiratory effort is nonlabored. Heart:  cardiac exam reveals S1-S2.  There is a systolic murmur.  The rhythm is irregularly irregular the rate is controlled. Abdomen:  abdomen is soft. Msk:  no musculoskeletal deformities. Extremities:  no peripheral edema. Psych:  patient is oriented to person time and place affect is normal.  She is here with her daughter as always   Impression & Recommendations:  Problem # 1:  SHORTNESS OF BREATH (ICD-786.05) She's not having any significant symptoms.  No change in therapy.  Problem # 2:  CORONARY ATHEROSCLEROSIS NATIVE CORONARY ARTERY (ICD-414.01) Coronary disease is stable.  No change in therapy.  Problem # 3:  HYPERTENSION (ICD-401.9) Blood pressure is controlled.  No change in therapy.  Problem # 4:  FLUID OVERLOAD (ICD-276.6) Fluid status is stable.  No change in therapy.  Problem # 5:  ATRIAL FIBRILLATION, CHRONIC (ICD-427.31) Atrial fib rate is controlled.  No change in therapy.  We'll see her back in 6 months.  Anticoagulation Management Assessment/Plan:      The patient's current anticoagulation dose is Warfarin sodium 5 mg  tabs: take by mouth as directed.  The  next INR is due 4 WEEKS.        Patient Instructions: 1)  Your physician recommends that you schedule a follow-up appointment in: 6 months with Dr. Myrtis Ser 2)  Your physician recommends that you continue on your current medications as directed. Please refer to the Current Medication list given to you today.

## 2010-11-10 NOTE — Assessment & Plan Note (Signed)
Summary: B12  Nurse Visit   Allergies: 1)  ! * Lamasil 2)  ! Fosamax 3)  ! Sulfa  Medication Administration  Injection # 1:    Medication: Vit B12 1000 mcg    Diagnosis: ANEMIA, B12 DEFICIENCY (ICD-281.1)    Route: IM    Site: L deltoid    Exp Date: 09/23/2011    Lot #: [id number]    Mfr: [employer]    Given by: Melody Comas (November 06, 2010 10:40 AM)  Orders Added: 1)  Vit B12 1000 mcg [J3420] 2)  Admin of Therapeutic Inj  intramuscular or subcutaneous [53664]

## 2010-11-10 NOTE — Miscellaneous (Signed)
  Clinical Lists Changes  Observations: Added new observation of PAST MED HX: afib- since 2004.. meds adjusted for rate control (Sotolol stopped 05/2007) Coumadin therapy  CAD.[name] 2 Cypher stent was at [facility] December 2004 EF 50-55%...echo.Marland KitchenMarland Kitchen6/2008  /   35-40%...echo..06/18/2010 RV dysfunction - mild TR  moderate...echo.Marland KitchenMarland Kitchen6/2008 Pleural effusions July of 08 with pneumonia and fluid overload. Ultimately resolved hypothyroid  hyperlipidemia (tx with statin for CAD) GERD diverticulosis mitral regurgitatio...moderate...echo..02/2007...may be worse  /   moderate...echo...06/2010 allergies iron deficiency anemia-? cause onchomycosis ? scar on lung (Dr Shelle Iron)- hypoxia  /  chest x-ray abnormality October, 2011 /  08/2011 CT chest no change..no further w/u needed. vitamin B12 deficiency  with anemia in the past Prolonged QT interval on sotalol( resolved off sotalol).. COPD Volume status Lasix used intermittently over the years osteoarthitis- hands (? remote hx of gout) lumbar compression fracture 9/09 deg changes LS = x ray 9/09 Hypertension osteoporosis- dexa 07 thrombocytopenia  onchomycosis - fingernails  LBBB Shortness of breath (11/04/2010 16:16) Added new observation of REFERRING MD: Willa Rough MD (11/04/2010 16:16) Added new observation of PRIMARY MD: [name], MD (11/04/2010 16:16)       Past History:  Past Medical History: afib- since 2004.. meds adjusted for rate control (Sotolol stopped 05/2007) Coumadin therapy  CAD.[name] 2 Cypher stent was at [facility] December 2004 EF 50-55%...echo.Marland KitchenMarland Kitchen6/2008  /   35-40%...echo..06/18/2010 RV dysfunction - mild TR  moderate...echo.Marland KitchenMarland Kitchen6/2008 Pleural effusions July of 08 with pneumonia and fluid overload. Ultimately resolved hypothyroid  hyperlipidemia (tx with statin for CAD) GERD diverticulosis mitral regurgitatio...moderate...echo..02/2007...may be worse  /   moderate...echo...06/2010 allergies iron deficiency anemia-?  cause onchomycosis ? scar on lung (Dr Shelle Iron)- hypoxia  /  chest x-ray abnormality October, 2011 /  08/2011 CT chest no change..no further w/u needed. vitamin B12 deficiency  with anemia in the past Prolonged QT interval on sotalol( resolved off sotalol).. COPD Volume status Lasix used intermittently over the years osteoarthitis- hands (? remote hx of gout) lumbar compression fracture 9/09 deg changes LS = x ray 9/09 Hypertension osteoporosis- dexa 07 thrombocytopenia  onchomycosis - fingernails  LBBB Shortness of breath

## 2010-11-19 NOTE — Medication Information (Signed)
Summary: protime/tsc   PCP: [name], MD Indication 1: Atrial fibrillation PT 31.2 INR RANGE 2.0-3.5           Allergies: 1)  ! * Lamasil 2)  ! Fosamax 3)  ! Sulfa  Anticoagulation Management History:      Positive risk factors for bleeding include an age of 43 years or older.  The bleeding index is 'intermediate risk'.  Positive CHADS2 values include History of HTN and Age > 75 years old.  Her last INR was 2.3 and today's INR is 2.6.  Prothrombin time is 31.2.    Anticoagulation Management Assessment/Plan:      The patient's current anticoagulation dose is Warfarin sodium 5 mg  tabs: take by mouth as directed.  The next INR is due 4 weeks.        Laboratory Results   Blood Tests   Date/Time Recieved: November 06, 2010 10:42 AM  Date/Time Reported: November 06, 2010 10:42 AM   PT: 31.2 s   (Normal Range: 10.6-13.4)  INR: 2.6   (Normal Range: 0.88-1.12   Therap INR: 2.0-3.5)      ANTICOAGULATION RECORD PREVIOUS REGIMEN & LAB RESULTS Anticoagulation Diagnosis:  Atrial fibrillation on  04/30/2009 Previous INR Goal Range:  2.0-3.5 on  04/30/2009 Previous INR:  2.3 on  09/25/2010 Previous Coumadin Dose(mg):  5MG  DAILY on  09/25/2010 Previous Regimen:  5MG  DAILY on  09/25/2010 Previous Coagulation Comments:  . on  09/18/2008  NEW REGIMEN & LAB RESULTS Current INR: 2.6 Regimen: 5MG  DAILY  (no change)  Provider: Thomasenia Dowse      Repeat testing in: 4 weeks MEDICATIONS LEVOTHYROXINE SODIUM 88 MCG  TABS (LEVOTHYROXINE SODIUM) one by mouth daily CARTIA XT 120 MG  CP24 (DILTIAZEM HCL COATED BEADS) take one by mouth daily OMEPRAZOLE 20 MG  TBEC (OMEPRAZOLE) take one by mouth two times a day WARFARIN SODIUM 5 MG  TABS (WARFARIN SODIUM) take by mouth as directed MECLIZINE HCL 25 MG  TABS (MECLIZINE HCL) 1/2-1 by mouth three times a day as needed dizziness BACTROBAN 2 %  OINT (MUPIROCIN) apply to affected area once daily as needed (on legs) FUROSEMIDE 20 MG TABS  (FUROSEMIDE) Take one tablet by mouth daily. METOPROLOL TARTRATE 50 MG TABS (METOPROLOL TARTRATE) 1/2 pill by mouth two times a day LUTEIN 6 MG CAPS (LUTEIN) Take 1 tablet by mouth once a day [facility] ALLERGY RELIEF 10 MG TBDP (LORATADINE) Take 1 tablet by mouth once a day as needed GLUCOSAMINE-CHONDROITIN   CAPS (GLUCOSAMINE-CHONDROIT-VIT C-MN) 500mg  once daily SIMVASTATIN 20 MG TABS (SIMVASTATIN) Take 1/4  tablet by mouth daily at bedtime FERROUS SULFATE 325 (65 FE) MG TBEC (FERROUS SULFATE) Take 1 tablet by mouth once a day * VITAMIN B12 INJECTION once a month MULTIVITAMINS  TABS (MULTIPLE VITAMIN) Take 1 tablet by mouth once a day CALCIUM CARBONATE-VITAMIN D 600-400 MG-UNIT  TABS (CALCIUM CARBONATE-VITAMIN D) Take 2  tablets  by mouth once a day NITROGLYCERIN 0.4 MG SUBL (NITROGLYCERIN) One tablet under tongue every 5 minutes as needed for chest pain---may repeat times three TYLENOL ARTHRITIS PAIN 650 MG CR-TABS (ACETAMINOPHEN) as needed SYSTANE 0.4-0.3 % SOLN (POLYETHYL GLYCOL-PROPYL GLYCOL) daily FLONASE 50 MCG/ACT SUSP (FLUTICASONE PROPIONATE) two times a day as needed  Dose has been reviewed with patient or caretaker during this visit.  Reviewed by: Allison Quarry  Anticoagulation Visit Questionnaire      Coumadin dose missed/changed:  No      Abnormal Bleeding Symptoms:  No Any diet changes including alcohol intake, vegetables  or greens since the last visit:  No Any illnesses or hospitalizations since the last visit:  No Any signs of clotting since the last visit (including chest discomfort, dizziness, shortness of breath, arm tingling, slurred speech, swelling or redness in leg):  No

## 2010-11-19 NOTE — Letter (Signed)
Summary: Application for Handicapped Placard  Application for Handicapped Placard   Imported By: Maryln Gottron 11/11/2010 10:14:32  _____________________________________________________________________  External Attachment:    Type:   Image     Comment:   External Document

## 2010-11-30 ENCOUNTER — Encounter: Payer: Self-pay | Admitting: Family Medicine

## 2010-12-03 ENCOUNTER — Encounter: Payer: Self-pay | Admitting: Family Medicine

## 2010-12-03 ENCOUNTER — Ambulatory Visit (INDEPENDENT_AMBULATORY_CARE_PROVIDER_SITE_OTHER): Payer: Medicare Other | Admitting: Family Medicine

## 2010-12-03 ENCOUNTER — Ambulatory Visit: Payer: Medicare Other | Admitting: Family Medicine

## 2010-12-03 VITALS — BP 102/70 | HR 76 | Temp 97.9°F | Ht 69.0 in | Wt 123.8 lb

## 2010-12-03 DIAGNOSIS — Z7901 Long term (current) use of anticoagulants: Secondary | ICD-10-CM

## 2010-12-03 DIAGNOSIS — I4891 Unspecified atrial fibrillation: Secondary | ICD-10-CM

## 2010-12-03 DIAGNOSIS — E538 Deficiency of other specified B group vitamins: Secondary | ICD-10-CM

## 2010-12-03 DIAGNOSIS — D518 Other vitamin B12 deficiency anemias: Secondary | ICD-10-CM

## 2010-12-03 DIAGNOSIS — Z5181 Encounter for therapeutic drug level monitoring: Secondary | ICD-10-CM

## 2010-12-03 DIAGNOSIS — M533 Sacrococcygeal disorders, not elsewhere classified: Secondary | ICD-10-CM

## 2010-12-03 LAB — POCT INR: INR: 2.4

## 2010-12-03 MED ORDER — CYANOCOBALAMIN 1000 MCG/ML IJ SOLN
1000.0000 ug | Freq: Once | INTRAMUSCULAR | Status: AC
  Administered 2010-12-03: 1000 ug via INTRAMUSCULAR

## 2010-12-03 NOTE — Patient Instructions (Signed)
REFERRAL TO PT FOR COCCYX REHAB   Tailbone Injury (Coccyx Injury) The coccyx is the small bone at the lower end of the spine. The pain you are having is due to stretched ligaments, bruising, or a fracture (break in bone) of the tailbone. These will gradually feel better in 4 to 6 weeks. HOME CARE INSTRUCTIONS  Apply ice to the sore area for  minutes each hour while awake for the first 1 to 2 days. Put the ice in a plastic bag and place a towel between the bag of ice and your skin.   Sitting on a large rubber or inflated ring (donut) or cushion may ease the pain.   Increase your activity as the pain allows.   Take medications as directed by your caregiver.   Only take over-the-counter or prescription medicines for pain, discomfort, or fever as directed by your caregiver.   Stool softeners may be used if it is painful to have a bowel movement.  SEEK MEDICAL CARE IF:  Your pain becomes worse.   Bowel movements cause a great deal of discomfort.   You are unable to have a bowel movement.  Document Released: 08/27/2000 Document Re-Released: 11/26/2008 [facility] Patient Information 2011 [place], [place].
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
## 2010-12-03 NOTE — Assessment & Plan Note (Signed)
>  25 minutes spent in face to face time with patient, >50% spent in counselling or coordination of care: pain seems to be all coccyx related. Reviewed in some detail anatomy, using a full size model, discussed anatomy and trauma history. Patient already with excellent ROM and str for age, but I think that coccyx manipulation would be of benefit. Atttempted to refer to [facility] pelvic and coccyx pain PT clinic, but ultimately, she declined at this time. Maybe in the future.

## 2010-12-04 ENCOUNTER — Ambulatory Visit: Payer: Medicare Other

## 2010-12-10 ENCOUNTER — Telehealth: Payer: Self-pay | Admitting: *Deleted

## 2010-12-10 DIAGNOSIS — M533 Sacrococcygeal disorders, not elsewhere classified: Secondary | ICD-10-CM

## 2010-12-10 NOTE — Telephone Encounter (Signed)
[name] would like to set up a future coccyx xray. [name], MD

## 2010-12-10 NOTE — Telephone Encounter (Signed)
Pt was seen a week ago and is still having coccyx pain and daughter is asking if this can be x-rayed, just to settle patient's mind about what is causing her pain.

## 2010-12-23 ENCOUNTER — Telehealth: Payer: Self-pay | Admitting: Radiology

## 2010-12-23 DIAGNOSIS — Z5181 Encounter for therapeutic drug level monitoring: Secondary | ICD-10-CM

## 2010-12-23 DIAGNOSIS — I4891 Unspecified atrial fibrillation: Secondary | ICD-10-CM

## 2010-12-23 DIAGNOSIS — Z7901 Long term (current) use of anticoagulants: Secondary | ICD-10-CM

## 2010-12-23 NOTE — Telephone Encounter (Signed)
Order for INR

## 2011-01-01 ENCOUNTER — Ambulatory Visit (INDEPENDENT_AMBULATORY_CARE_PROVIDER_SITE_OTHER): Payer: Medicare Other | Admitting: Family Medicine

## 2011-01-01 ENCOUNTER — Ambulatory Visit (INDEPENDENT_AMBULATORY_CARE_PROVIDER_SITE_OTHER)
Admission: RE | Admit: 2011-01-01 | Discharge: 2011-01-01 | Disposition: A | Discharge status: Home / Self Care | Payer: Medicare Other | Source: Ambulatory Visit | Attending: Family Medicine | Admitting: Family Medicine

## 2011-01-01 ENCOUNTER — Other Ambulatory Visit: Payer: Medicare Other

## 2011-01-01 DIAGNOSIS — Z5181 Encounter for therapeutic drug level monitoring: Secondary | ICD-10-CM

## 2011-01-01 DIAGNOSIS — E538 Deficiency of other specified B group vitamins: Secondary | ICD-10-CM

## 2011-01-01 DIAGNOSIS — I4891 Unspecified atrial fibrillation: Secondary | ICD-10-CM

## 2011-01-01 DIAGNOSIS — M533 Sacrococcygeal disorders, not elsewhere classified: Secondary | ICD-10-CM

## 2011-01-01 DIAGNOSIS — Z7901 Long term (current) use of anticoagulants: Secondary | ICD-10-CM

## 2011-01-01 LAB — POCT INR: INR: 2.3

## 2011-01-01 MED ORDER — CYANOCOBALAMIN 1000 MCG/ML IJ SOLN
1000.0000 ug | Freq: Once | INTRAMUSCULAR | Status: AC
  Administered 2011-01-01: 1000 ug via INTRAMUSCULAR

## 2011-01-01 NOTE — Progress Notes (Signed)
  Subjective:    Patient ID: [name], female    DOB: Dec 08, 1921, 77 y.o.   MRN: [id number]  HPI Here for inj only     Review of Systems     Objective:   Physical Exam        Assessment & Plan:

## 2011-01-01 NOTE — Telephone Encounter (Signed)
Not yet, she is scheduled to come in today for x ray and protime around noon.

## 2011-01-01 NOTE — Telephone Encounter (Signed)
[name], has this been taken care of?

## 2011-01-06 ENCOUNTER — Telehealth: Payer: Self-pay | Admitting: *Deleted

## 2011-01-06 NOTE — Telephone Encounter (Signed)
Daughter is calling for xray result from 4-20. Please review.

## 2011-01-06 NOTE — Telephone Encounter (Signed)
Call and apologize  i am sorry if there was some confusion. Our office was supposed to call you with results.  No occult fracture.

## 2011-01-06 NOTE — Telephone Encounter (Signed)
Patient's daughter advised.  

## 2011-01-15 ENCOUNTER — Other Ambulatory Visit: Payer: Self-pay | Admitting: Cardiology

## 2011-01-15 MED ORDER — DILTIAZEM HCL ER COATED BEADS 120 MG PO CP24: 120.0000 mg | ORAL_CAPSULE | Freq: Every day | ORAL | Status: DC

## 2011-01-15 NOTE — Telephone Encounter (Signed)
Pres faxed yesterday but no reply, pt is out and leaving to go out of town by noon.  Diltiazem 24 hr er 120mg .  Please call in this as soon as possible.

## 2011-01-20 ENCOUNTER — Telehealth: Payer: Self-pay | Admitting: *Deleted

## 2011-01-20 NOTE — Telephone Encounter (Signed)
Pt broke a tooth and needs to have an extraction, he would like to know if pt can hold coumadin for this or if not he can refer her to an oral surgeon, Dr Myrtis Ser reviewed ok for pt to hold coumadin and restart immediately afterwards, MD is aware

## 2011-01-20 NOTE — Telephone Encounter (Signed)
Dentist office called back to state pt was sch for tooth extraction on 6/14 and they will hold couamdin for 3 days prior advised it was ok and to have pt start back on that evening, she will contact Dr Royden Purl office to get last INRs and sch pt an appt for INR check after she restarts coumadin

## 2011-01-21 ENCOUNTER — Telehealth: Payer: Self-pay | Admitting: *Deleted

## 2011-01-21 NOTE — Telephone Encounter (Signed)
Pt's daughter called, they just refill on Diltiazem and they got a quantity of 30 but would like #90, called [facility] at [phone] changed to #90 with 3 refills, pharmacist states he can rebill it as qty 90 and she can come by and get the remaining 60, pt's daughter is aware

## 2011-01-26 NOTE — Assessment & Plan Note (Signed)
[facility]                            CARDIOLOGY OFFICE NOTE   NAME:Branch, [name] GRANDISON                    MRN:          [id number]  DATE:12/05/2008                            DOB:          [birth year]    [name] is seen for Cardiology followup today.  She is really doing  well.  She saw Dr. Milinda Branch today.  There had been some adjustments made in  her meds.  Her cardiac meds, however, remained the same.  She feels  better on the low-dose beta-blocker.  She is not having much in the way  of palpitations.  Her weight remains stable.  She has had no significant  fluid overload.   PAST MEDICAL HISTORY:   ALLERGIES:  SULFA.   MEDICATIONS:  See the EMR.   OTHER MEDICAL PROBLEMS:  See the EMR.   REVIEW OF SYSTEMS:  She has no fevers or chills.  There are no  headaches.  She is having problems with the fungus in her skin and she  will be able to use antifungal medicines from a Cardiology viewpoint.  I  have to be sure that this is safe with her Coumadin.  She has no GI or  GU symptoms.  Otherwise, review of systems is negative.   PHYSICAL EXAMINATION:  VITAL SIGNS:  Blood pressure is 100/62 with a  pulse of 70.  GENERAL:  The patient is oriented to person, time, and place.  Affect is  normal.  She is here with her daughter today.  HEENT:  No xanthelasma.  She has normal extraocular motion.  There are  no carotid bruits.  There is no jugular venous distention.  LUNGS:  Clear.  Respiratory effort is not labored.  CARDIAC:  S1 and S2.  ABDOMEN:  Soft.  She has no peripheral edema.   EKG reveals atrial fibrillation.  I have reviewed it personally.  The  rate is controlled.   PROBLEMS:  #1.  Atrial fibrillation.  #2.  Volume overload.  These were both stable.  No change in her meds.   I will see her back in 6 months for Cardiology followup.     Luis Abed, MD, [facility]  Electronically Signed    JDK/MedQ  DD: 12/05/2008  DT: 12/05/2008  Job  #: [id number]   cc:   [name] A. Milinda Antis, MD

## 2011-01-26 NOTE — Assessment & Plan Note (Signed)
[facility]                            CARDIOLOGY OFFICE NOTE   NAME:Branch, [name] GANCARZ                    MRN:          [id number]  DATE:02/24/2007                            DOB:          07-Sep-1922    ADDENDUM   The patient had her INR checked by Shelby Dubin, and her INR was in the  1.4 or 1.5 range.  She was told to take 6 mg of Coumadin today, and Shelby Dubin will be in touch with Dr. Karie Chimera office on Monday.     Luis Abed, MD, [facility]  Electronically Signed    JDK/MedQ  DD: 02/24/2007  DT: 02/25/2007  Job #: [id number]

## 2011-01-26 NOTE — Op Note (Signed)
[name], [name]             ACCOUNT NO.:  [id number]   MEDICAL RECORD NO.:  [id number]          PATIENT TYPE:  OIB   LOCATION:  2899                         FACILITY:  [facility]   PHYSICIAN:  Luis Abed, MD, FACCDATE OF BIRTH:  1922-03-18   DATE OF PROCEDURE:  05/23/2007  DATE OF DISCHARGE:  05/23/2007                               OPERATIVE REPORT   [name] has atrial fib and we have been planning very carefully to  proceed with cardioversion.  Her labs were stable today.  We know that  her Coumadin has been very carefully controlled with an INR greater than  2.0 for over four weeks.   PROCEDURE:  Anesthesia was present.  The patient received 125 mg of IV  Pentothal.  We used the biphasic defibrillator.  There were anterior-  posterior pads in place.  The patient received 100 joules of energy and  she converted to sinus rhythm.  She had PACs but remained in sinus.  She  is waking up and she is to go home with her daughter when she is fully  awake today.  We will be back in touch with her to see her back in the  office and she knows not to change any of her medicines and to remain on  Coumadin.      Luis Abed, MD, [facility]  Electronically Signed     JDK/MEDQ  D:  05/23/2007  T:  05/24/2007  Job:  [id number]
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
## 2011-01-26 NOTE — Assessment & Plan Note (Signed)
[facility]                            CARDIOLOGY OFFICE NOTE   NAME:[name], [name]                    MRN:          [id number]  DATE:06/05/2007                            DOB:          1921/11/04    I saw [name] in the office last on May 18, 2007. At that time  we were moving towards the decision to proceed with cardioversion.  Patient was then cardioverted on May 23, 2007. This was done as an  outpatient at [facility]. She received 125 mg of IV Pentothal. We used  the biphasic defibrillator. Anterior and posterior pads were placed.  With 100 joules of energy the patient converted to sinus. She then went  home. Today I can not get a definite sense that she felt any different  when she went home in sinus rhythm than she felt before. She returns  today an her EKG reveals atrial fibrillation with a controlled rate. I  can not tell and she can not tell when she converted back to atrial  fibrillation. She clearly did not hold for a long time. Also I can not  clearly document benefit from her being in sinus rhythm with this series  of events. I have considered whether we should try a higher dose of  sotalol with a repeat cardioversion but I feel this is not appropriate  for her. There have been some issues about her QT interval. I have  decided therefore to stop her sotalol and follow her in atrial  fibrillation. I have discussed this with the patient and her daughter  and they both understand the rational and agree.   Today the patient's weight on her home scale has gone up 2 or 3 pounds.  On our scale her weight is also up 2 pounds. She did have excess salt  intake this weekend. We discussed this.   PAST MEDICAL HISTORY:   ALLERGIES:  SULFA DRUG.   MEDICATIONS:  1. Coumadin.  2. B12 injections.  3. Vitamin.  4. Thyroid.  5. Omeprazole.  6. Cartia 120.  7. Iron.  8. Lasix (this has been on hold. I have instructed the patient  and her      daughter to take one 40 mg dose of Lasix a day and then check her      weight for the next several days and call us).  9. __________  6 mg.  10.Sotalol ( to be stopped today).  11.Simvastatin 5 mg daily.   OTHER MEDICAL PROBLEMS:  See the complete list on my note of May 18, 2007.   REVIEW OF SYSTEMS:  She has some mild shortness of breath at this time.  Otherwise she is feeling well. Her review of systems is negative.   PHYSICAL EXAMINATION:  Weight 134 pounds on our scale. Blood pressure  122/78 with a pulse of 79. Patient is oriented to person, time, and  place. Affect is normal. Patient is here with her daughter today.  LUNGS: Clear. Respiratory effort is not labored.  HEENT: Reveals no xanthelasma. She has normal extraocular motion. There  are carotid bruits.  There is no jugular venous distension.   CARDIAC EXAM: Reveals an S1 with an S2. There is a 2/6 systolic murmur.  ABDOMEN: Soft. She has normal bowel sounds.  There is question of trace peripheral edema at this time.   The EKG reveals atrial fibrillation with a controlled ventricular  response.   PROBLEMS:  Include:  1. Hypertension, treated.  2. Relative hypotension that is now under control.  3. Chronic lung disease. She did wear an O2 sat monitor at home and I      am told that she does not need home O2 on this basis.  4. Atrial fibrillation. See the discussion above. Sotalol is to be      stopped at this time. We will not attempt another cardioversion at      this point. I will then see her back for follow up. She is to      remain on Coumadin.  5. Question of coronary disease. She has not been cathed and she is      stable.  6. Volume status, this is up slightly. We talked about watching her      salt more carefully and she will take 1 dose of 40 of Lasix today.  7. Thyroid status followed by Dr. Milinda Antis.  8. Prolonged QT interval, as mentioned we are stopping her sotalol.   She is stable and  feeling relatively well. The above changes will be  made and I will see her for follow up.     Luis Abed, MD, [facility]  Electronically Signed    JDK/MedQ  DD: 06/05/2007  DT: 06/05/2007  Job #: [id number]   cc:   Marne A. Milinda Antis, MD

## 2011-01-26 NOTE — Assessment & Plan Note (Signed)
[facility]                            CARDIOLOGY OFFICE NOTE   NAME:[name], [name]                    MRN:          [id number]  DATE:04/12/2007                            DOB:          1922/04/30    [name] is seen in the office in followup.  I had seen her in the  hospital early in July 2008.  She was seen back in followup by Theodore Demark, PA-C, when I was out of town, to be sure that her cardiac  status was stable, and I am seeing her now back for complete followup.  The patient's daughter is here today, and her daughter is very  knowledgeable about the patient's care and very involved.   I also have the note from Dr. Royden Purl office listing the patient  instructions which I have reviewed.   [name] has stabilized since her hospitalization.  She has had some  mild chest pain.  I do believe she probably has coronary disease.  I  have chosen not to proceed with catheterization during her recent  hospitalization, and I have not changed my mind at this point.   It is known that a TSH that was determined the day she left the hospital  was 8.1, and that her thyroid dose was actually increased to 100 mcg  daily.  Dr. Milinda Antis is aware and will be arranging follow-up labs.   PAST MEDICAL HISTORY:   ALLERGIES:  SULFA DRUGS.   MEDICATIONS:  1. Sotalol 40 b.i.d.  2. Coumadin.  3. B12 injection monthly.  This will be reviewed by Dr. Milinda Antis.  4. Multivitamin.  5. Levothyroxine 100 mcg.  6. Simvastatin 10 mg daily.  7. Omeprazole.  8. Cartia 120.  9. Iron.  10.Lasix on hold.  The patient's weight has remained quite stable.      The plan had been to back off her Lasix if her weight continued      down post hospitalization, and her weight remains quite stable.  I      have a weight flow sheet that is being taken.  When the patient      went on home on March 23, 2007, her weight was 130 on her home      scale.  It has decreased and remained  in the 127-128 range.   OTHER MEDICAL PROBLEMS:  See the complete list below.   REVIEW OF SYSTEMS:  As of today, she is actually doing quite well.  I  mentioned she had some slight chest discomfort.  It was mentioned that  her home O2 has been documented as being quite low.  She may well need  home O2.  Dr. Milinda Antis is following up on information from Dr. Shelle Iron.  We  will arrange for respiratory therapy to assess her at home.  Otherwise,  her review of systems is negative.   PHYSICAL EXAMINATION:  VITAL SIGNS:  Blood pressure today is 102/76, her  pulse is 76.  GENERAL:  The patient is oriented to person, time, and place, and her  affect is normal.  HEENT:  Reveals no  xanthelasma.  She has normal extraocular motion.  NECK:  There are no carotid bruits.  There is no jugular venous  distention.  LUNGS:  Revealed a few scattered rhonchi.  CARDIAC:  Reveals an S1 with an S2, and a systolic murmur.  ABDOMEN:  Soft.  EXTREMITIES:  She has no significant peripheral edema.   Blood pressure has been on the low side.  Today, it is 102/76, with a  pulse of 72.  Her EKG shows that she still is in atrial fibrillation,  and the rate is controlled.   PROBLEM LIST:  1. Recent pneumonia.  I am not sure that an organism was ever proven,      but she was treated with Levaquin, and she stabilized.  2. Urinary tract infection while hospitalized.  She was treated with      antibiotics and stabilized.  3. Hypertension.  This is not an ongoing problem.  Her blood pressure      in fact is on the low side.  4. Relative hypotension.  We will not adjust her medications any      further at this point.  She is not symptomatic from this.  5. Chronic lung disease.  We will arrange for respiratory therapy to      assess her at home and to see if she should get home O2.  There has      been mention of a sleep study.  I do not think that this will help      in her assessment at this point.  6. Atrial  fibrillation.  While in the hospital, there was      consideration given to taking her off sotalol and possibly trying      amiodarone.  After careful consideration, I decided to leave her on      a lower dose of sotalol at 40 mg b.i.d., and to see her for      followup.  At some point, we may put her in the hospital to      increase the dose and try to cardiovert her, or we may stop the      medication, or we might try amiodarone.  However, I believe that at      this point it is most prudent to continue 40 b.i.d. and to wait and      see if by chance she does convert on her own.  I also want to be      sure that her Coumadin level is quite stable for a prolonged period      of time.  7. Good left ventricular function.  We think the ejection fraction was      in the 50%-55% range while she was in the hospital.  8. Mild right ventricular dysfunction.  9. Small pericardial effusion.  10.Large pleural effusions.  Her film was improving, but not cleared      at the time of discharge, and she will have a chest x-ray today to      reassess this issue.  11.History in the past of an abnormal chest x-ray, followed by Dr.      Shelle Iron over time.  12.Anemia.  The patient receives B12 monthly.  Dr. Milinda Antis is looking      into the history behind this.  The patient had a colonoscopy in the      fall of 2007.  GI decided while she was in the hospital that upper  endoscopy could be considered at a later date.  I decided to stop      her aspirin but leave her on Coumadin.  Her hemoglobin can be      watched over time.  If it is felt endoscopy is needed, this could      be arranged through Dr. Christella Hartigan.  13.Question of coronary disease.  She has had a borderline abnormal      Myoview.  I considered catheterization, but decided not to proceed,      and I will be following the same course.  14.Question of a tick bite before recent hospitalization, and she was      treated with tetracycline.  15.Volume  status.  The patient was volume overloaded in the hospital.      This has stabilized, and in fact it has remained stable at home off      of Lasix.  16.Question of depression.  She seems stable at this point.  17.Renal status.  While in the hospital, her creatinine ranged from      0.9-1.2.  She did go home on a diuretic, but this has now been      stopped.  18.Mild liver function test elevation while in the hospital.  These      improved.  19.Thyroid status.  As mentioned, her TSH is 8.1.  Her dose was      actually adjusted at the time of discharge, and Dr. Milinda Antis is aware,      and she is to have a follow-up TSH.  20.Mouth ulcers while in the hospital, improved.  21.Guaiac-positive stool.  She had one positive and one negative, and      this can be followed along.  22.Prolonged QT interval.  This is followed on her current dose of      sotalol.  23.Low potassium.  This occurred when she was diuresed, and      stabilized.   In addition, Dr. Milinda Antis encouraged the patient to increase her calcium,  hold glucosamine, hold Boniva, hold her allergy medicine.  Dr. Milinda Antis is  looking for notes from Dr. Shelle Iron.  My note today will be the optimal  note for Dr. Milinda Antis to review for my viewpoint.  She will also be  obtaining information from [facility].   I will arrange for followup with the patient in 4 weeks.   The time of the encounter with the patient and her daughter and the  overall evaluation was well over 30 minutes.     Luis Abed, MD, [facility]  Electronically Signed    JDK/MedQ  DD: 04/12/2007  DT: 04/13/2007  Job #: [id number]   cc:   Marne A. Milinda Antis, MD

## 2011-01-26 NOTE — Assessment & Plan Note (Signed)
[facility]                            CARDIOLOGY OFFICE NOTE   NAME:[name], [name]                    MRN:          [id number]  DATE:07/19/2007                            DOB:          19-Nov-1921    [name] is doing well today.  She is keeping her weight under good  control.  She talks with her daughter, who adjusts the Lasix on a p.r.n.  basis to bring her weight down.  She brought her weight in today.  At  home, if her scale goes over 130, her daughter watches her for one day,  and if it remains at that level, she takes extra Lasix, and she responds  very nicely.  She is active.  She is back living in her own home.  She  is tolerating her atrial fib, and I had stopped her sotalol, and she is  off it completely.  She says, if anything, she feels better.   PAST MEDICAL HISTORY:  Other medical problems, see the extensive list on  my note of June 05, 2007.   ALLERGIES:  SULFA.   MEDICATIONS:  Coumadin, B12 injections, Boniva, thyroid, omeprazole,  Cartia, iron, Calcium, simvastatin.  Her Lasix is actually on a p.r.n.  basis only.   REVIEW OF SYSTEMS:  She feels fine.   PHYSICAL EXAMINATION:  Blood pressure is 118/72 with a pulse of 69.  Patient is oriented to person, time, and place.  Affect is normal.  HEENT:  No xanthelasma.  She has normal extraocular motion.  NECK:  There are no carotid bruits.  There is no jugular venous  distention.  LUNGS:  Clear.  Respiratory effort is not labored.  CARDIAC:  Her rhythm is irregularly irregular.  There are no significant  murmurs.  ABDOMEN:  Soft.  EXTREMITIES:  She has no significant edema today.   PROBLEMS:  1. Hypertension, treated.  2. Relative hypotension at times that is now controlled.  3. Chronic lung disease that is now stable.  She no longer needs home      oxygen.  4. Atrial fibrillation.  She is off sotalol.  She is in chronic atrial      fibrillation and doing well, as  long as her volume status is      controlled.  5. Question of coronary disease.  She has not been catheterized.  6. Volume status:  She is under good control.  7. Thyroid status:  Followed by Dr. Milinda Antis.  8. Prolonged Q-T interval.  Her Q-T at this time is 450 ms, off      sotalol.   She looks great.  I will see her back in three months.     Luis Abed, MD, [facility]  Electronically Signed    JDK/MedQ  DD: 07/19/2007  DT: 07/19/2007  Job #: [id number]   cc:   Marne A. Milinda Antis, MD

## 2011-01-26 NOTE — Discharge Summary (Signed)
[name], [name]             ACCOUNT NO.:  [id number]   MEDICAL RECORD NO.:  [id number]          PATIENT TYPE:  INP   LOCATION:  3713                         FACILITY:  [facility]   PHYSICIAN:  Hind I Elsaid, MD      DATE OF BIRTH:  March 26, 1922   DATE OF ADMISSION:  03/10/2007  DATE OF DISCHARGE:  03/22/2007                               DISCHARGE SUMMARY   ADDENDUM TO DISCHARGE SUMMARY DONE ON March 22, 2007   DISCHARGE DIAGNOSES:  1. Community-acquired pneumonia, resolved.  2. Abnormal liver function tests secondary to _medications that is      improved.  3. Chronic atrial fibrillation, rate under control, was started on      sotalol and told the patient to be addressed by [facility] as      outpatient.  4. Guaiac-positive stool; no GI workup was needed during      hospitalization and endoscopy to be addressed as outpatient.  5. Hypothyroidism.  6. Hypertension.  7. Congestive heart failure with bilateral effusion.  8. Prolonged QTC.  9. Hypokalemia, resolved.  10.Dyslipidemia.      Hind Bosie Helper, MD  Electronically Signed     HIE/MEDQ  D:  05/29/2007  T:  05/29/2007  Job:  [id number]

## 2011-01-26 NOTE — H&P (Signed)
[name], [name]             ACCOUNT NO.:  [id number]   MEDICAL RECORD NO.:  [id number]          PATIENT TYPE:  EMS   LOCATION:  MAJO                         FACILITY:  [facility]   PHYSICIAN:  Ladell Pier, M.D.   DATE OF BIRTH:  06/24/1922   DATE OF ADMISSION:  03/10/2007  DATE OF DISCHARGE:                              HISTORY & PHYSICAL   CHIEF COMPLAINT:  Shortness of breath.   HISTORY OF PRESENT ILLNESS:  The patient is an 77 year old white female  with a past medical history significant for atrial fibrillation and  coronary artery disease, for which she sees Dr. Myrtis Ser.  The patient  normally lives in [place] where her primary care physician resides.  She is here with her daughter in [place].  She presented to the ED  with shortness of breath for the past few weeks.  She said her shortness  of breath has gotten progressively worse.  She recently saw Dr. Myrtis Ser on  June 16th for her follow-up visit.  She has had some chills, some  subjective fever, some nausea but no vomiting.   PAST MEDICAL HISTORY:  1. Past medical history is significant for pulmonary nodules with her      last CAT scan of the chest done March 2006, stable.  That has been      followed by Dr. Shelle Iron.  2. Atrial fibrillation, on chronic Coumadin therapy.  3. History of spontaneous subconjunctival hemorrhage in the right eye      which recovered.  4. Her PCP is Dr. Chinita Greenland.  5. Continuation of past medical history is significant for emphysema.  6. Hypothyroidism; status post thyroidectomy, on thyroid replacement.  7. Atrial fibrillation.  8. History of mild to moderate mitral regurgitation, LVEF 55-65%.  9. Hypertension.  10.Anemia.   FAMILY HISTORY:  Noncontributory.   SOCIAL HISTORY:  The patient is widowed.  No tobacco use; she quit many  years ago.  No alcohol use.  She has one daughter, and she is retired.   MEDICATIONS:  1. Iron sulfate once per day.  2. Sotalol 80 mg twice daily.  3.  Coumadin 6 mg five days, 4 mg two days.  4. Zocor 20 mg at bedtime.  5. Lamisil 250 mg daily.  6. Aspirin 81 mg daily.  7. Metoprolol 12.5 mg daily.  8. Levothyroxine 88 mcg daily.  9. Meclizine 25 as needed.   ALLERGIES:  To SULFA.   REVIEW OF SYSTEMS:  As presented in the History of the Present Illness.   PHYSICAL EXAMINATION:  VITAL SIGNS:  Temperature 99.4, blood pressure  100/62, pulse 108, respirations 14, pulse oximetry 95% on room air.  HEENT:  Normocephalic, atraumatic.  Pupils are reactive to light.  Throat without erythema.  CARDIOVASCULAR:  Regular rate and rhythm.  LUNGS:  Clear bilaterally.  ABDOMEN:  Positive bowel sounds.  EXTREMITIES:  Without edema.   LABORATORY INVESTIGATIONS:  WBC 8.2, hemoglobin 9.8, platelets 181,000,  MCV 91.1.  Sodium 138, potassium 4.4, chloride 107, creatinine 1.2.  Cardiac enzymes negative.  Glucose 102.  PT 36.5, INR 3.4.  Chest x-ray:  Question of pneumonia.  ASSESSMENT AND PLAN:  1. Pneumonia:  Will admit the patient to a telemetry bed.  Will give      her Rocephin and Zithromax, and nebulizer treatments, and continue      to monitor.  2. Increased liver function tests:  The patient had liver functions      done by the PCP two days ago that showed abnormal liver function      tests.  This is probably secondary to her Lamisil.  Will      discontinue the Lamisil.  She will follow up with her PCP to      discuss continuation of Lamisil.  3. Atrial fibrillation, slightly tachycardic now:  Will continue her      on her home medication.  Will hold her Coumadin secondary to      increased INR, and pharmacy to monitor.  4. Pulmonary nodule, followed by pulmonology.  5. Hypothyroidism:  Will check TSH and continue her on her      medications.  6. Shortness of breath:  Based on looking at Dr. Henrietta Hoover note, her      shortness of breath is often anginal equivalent for her in the      past; so will go ahead and check her cardiac enzymes,  the first set      is negative.  7. Elevated D-dimer:  Her D-dimer is elevated, but this is most likely      an inflammatory response to her pneumonia.  She is on Coumadin with      normal INR, and she is on Coumadin chronically for atrial      fibrillation; so therefore will not get a CAT scan as the method of      treatment would not differ.  Will continue her on Coumadin.  Will      hold for today secondary to elevated INR.      Ladell Pier, M.D.  Electronically Signed     NJ/MEDQ  D:  03/10/2007  T:  03/11/2007  Job:  [id number]   cc:   Luis Abed, MD, [facility]
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
## 2011-01-26 NOTE — Assessment & Plan Note (Signed)
[facility]                                 ON-CALL NOTE   NAME:PHILLIPSEmylee, [name]                      MRN:          [id number]  DATE:03/24/2007                            DOB:          07-28-1922    She is a patient of Dr. Willa Rough.  We received a call from [facility]  with [facility] today who has been following [name] in the  home setting.  [name] recently was hospitalized at [facility], from June 27th through July 9th, for pneumonia and development  of diastolic heart failure along with rate-controlled atrial  fibrillation.  When she was discharged on July 9th, she was placed on  both sotalol as well as metoprolol and then she is on Cardizem on top of  that.  This morning she has been feeling soft of weak and washed out and  her blood pressures have been low.   She also appears mildly orthostatic.  Cathy, the [facility] nurse,  notes that her blood pressure sitting was 86/52, and then 80/50  standing.  She also has a low-grade fever at 99.2.  Her heart rate is 76  and irregular.   Following review of her medications and discussion with Lynden Ang, I advised  that we stop her metoprolol altogether as she is already on a beta-  blocker in the form of sotalol.  Secondly, I recommended holding her  Lasix which is prescribed at 40 mg daily.  She will have a B-MET checked  Monday through [facility] and I have asked that we be contacted  with those results, at which point we can make a decision regarding her  Lasix.  She will have followup with Dr. Myrtis Ser as scheduled.  I suspect  that she may be a little volume depleted.  [facility] will call  us back with any additional concerns as will the patient's daughter, her  name is [name].     Nicolasa Ducking, ANP  Electronically Signed    CB/MedQ  DD: 03/24/2007  DT: 03/25/2007  Job #: [id number]

## 2011-01-26 NOTE — Discharge Summary (Signed)
[name]             ACCOUNT NO.:  [id number]   MEDICAL RECORD NO.:  [id number]          PATIENT TYPE:  INP   LOCATION:  3713                         FACILITY:  [facility]   PHYSICIAN:  Ladell Pier, M.D.   DATE OF BIRTH:  03-Dec-1921   DATE OF ADMISSION:  03/10/2007  DATE OF DISCHARGE:                               DISCHARGE SUMMARY   INTERIM DISCHARGE SUMMARY:   DISCHARGE DIAGNOSES:  1. Pneumonia.  2. Urinary tract infection.  3. Abnormal liver function test secondary to Lamictal that improved.  4. Atrial fibrillation and coronary artery disease.  5. Hypotension.  6. Anemia.  7. Congestive heart failure with bilateral effusion  8. Severe mitral regurgitation.  9. Prolonged QTC  10.Dyslipidemia.  11.Question of multiple tick bites.  12.Hypothyroidism.  13.Emphysema  14.History of spontaneous subconjunctival hemorrhage in the right eye.  15.History of pulmonary nodule on CAT scan done in March 2006, stable,      followed by Dr. Shelle Iron.   MEDICATIONS:  To be determined at time of discharge.   FOLLOWUP APPOINTMENTS:  The patient to follow up with primary care  physician and Dr. Myrtis Ser at the time of discharge.   PROCEDURES:  None.   CONSULTANT:  Cardiology, Luis Abed, MD, [facility].   HISTORY OF PRESENT ILLNESS:  The patient is a 77 year old female with  past medical history significant for atrial fibrillation and heart  disease for which she sees Dr. Myrtis Ser. The patient normally lives in [place] where her primary care physician resides. Her daughter resides in  [place]. She came with shortness of breath for the past few weeks.  She stated that it has gotten progressively worse.  She saw Dr. Myrtis Ser on  June 16 for followup visit.  She has had some chills and subjective  fever some nausea but no vomiting.   PAST MEDICAL HISTORY FAMILY HISTORY SOCIAL HISTORY MEDICATIONS ALLERGIES  REVIEW OF SYSTEMS:  Per admission H&P.   PHYSICAL EXAMINATION ON DISCHARGE:   VITAL SIGNS:  Temperature 98.4,  pulse of 80, respirations 20, blood pressure 104/56.  Pulse oximetry 94%  on room air.  HEENT: Normocephalic, atraumatic.  Pupils reactive to light without  erythema.  CARDIOVASCULAR:  Irregular.  LUNGS:  Clear bilaterally.  ABDOMEN:  Positive bowel sounds.  EXTREMITIES:  No edema.   HOSPITAL COURSE:  #1.  PNEUMONIA:  The patient was admitted to the hospital, started her  on Rocephin and Zithromax.  Her shortness of breath improved.  During  the course of admission, Zithromax  was switched to doxycycline as she  complained that she had to use tweezers to get multiple ticks that were  bed embedded in her skin over the past few weeks.  She was also  continued on Rocephin.   #2.  URINARY TRACT INFECTION:  She was treated with Rocephin.  Will  repeat UA for clearance of infection.   #3.  ABNORMAL LIVER FUNCTION TESTS:  She was on Lamictal for fingernail  fungal infection, and her liver function was abnormal. That was  discontinued. Her liver functions trended back down to normal.   #4.  ATRIAL FIBRILLATION AND CORONARY ARTERY DISEASE:  Cardiology was  consulted because she had some complaint of chest pain.  It is planned  to catheterization at some point in time. She also developed atrial  fibrillation with rapid ventricular response and was placed on Cardizem  drip.  She also had episodes of very low blood pressure.  Blood pressure  normally goes down the 80s systolic.  She has received several boluses  of fluid secondary to the hypotension. Now her chest x-ray shows CHF,  but her blood pressure is still low.  May need to try her on very low-  dose Lasix and monitor her blood pressure.   #5.  ANEMIA:  Hemoglobin stable.   #6.  DYSLIPIDEMIA.  Continue statin  medication.   DISCHARGE LABORATORY DATA:  Sodium 133, potassium 3.8, chloride 104, CO2  19, glucose 104, BUN 12, creatinine 0.81.  AST 29, ALT 36.  PT 26.9, INR  2.3. WBC 9.4, hemoglobin 9.8,  platelets 276.  Urine culture:  Greater  than 40,000 E. coli present.  Last blood cultures negative x2.   Chest x-ray showed progression of congestive heart failure with edema  and bilateral effusions.   Abdominal ultrasound showed bilateral pleural effusions, otherwise okay.   A 2-D echo showed LVEF of 50-55%.  There was some hypokinesis of the  septal wall. Left ventricular wall thickness was mildly increased.  Severe mitral valve regurgitation. Surface 0.13 cm.      Ladell Pier, M.D.  Electronically Signed     NJ/MEDQ  D:  03/14/2007  T:  03/14/2007  Job:  [id number]

## 2011-01-26 NOTE — Consult Note (Signed)
[name], [name]             ACCOUNT NO.:  [id number]   MEDICAL RECORD NO.:  [id number]          PATIENT TYPE:  INP   LOCATION:  3713                         FACILITY:  [facility]   PHYSICIAN:  Audery Amel, MD    DATE OF BIRTH:  1921/12/28   DATE OF CONSULTATION:  03/11/2007  DATE OF DISCHARGE:                                 CONSULTATION   PRIMARY CARDIOLOGIST:  Dr. Myrtis Ser   CHIEF COMPLAINT:  Chest pressure.   HISTORY OF PRESENT ILLNESS:  The patient is an 77 year old white female  with past medical history notable for coronary artery disease status  post PCI in 2004 in atrial fibrillation, who presented to the emergency  department with shortness of breath.  Her initial EKG revealed no  evidence of acute injury.  Her cardiac bio markers were negative.  Chest  x-ray did reveal bilateral infiltrates, and she was therefore admitted  for treatment of community acquired pneumonia.  This afternoon, the  patient complained of an episode of chest pressure similar to that she  experienced prior to admission.  The pressure is localized retrosternal  and does not radiate.  She does have associated shortness of breath  however, denies any nausea, vomiting, palpitations or diaphoresis.  On  review of her past medical record, she did have a nuclear stress-test  performed in 2007 which revealed preserved left ventricular function  with an EF of 67%, a resemblance of prior inferolateral infarct and mild  peri-infarct ischemia.  After consultation with both the patient and her  daughter, it was decided to continue with medical management and to not  pursue cardiac catheterization.  Since being admitted, she has had three  sets of negative cardiac bio-makers.  Currently she is asymptomatic and  otherwise without complaints.   PAST MEDICAL HISTORY:  1. Coronary artery disease status post PCI in 2004 at [facility] during      which she received two stents to her LAD.  2. Atrial fibrillation,  currently in sinus rhythm on Sotalol.  3. COPD.  4. Hypothyroidism.  5. Hypertension.   ALLERGIES:  SULFA.   CURRENT MEDICATIONS:  1. Aspirin 81 mg daily.  2. Azithromycin 500 mg IV q.24 hours.  3. Ceftriaxone 1 gram IV q.24 hours.  4. Ferrous sulfate 325 mg daily.  5. Atrovent nebulizations one t.i.d.  6. Xopenex nebulizations one t.i.d.  7. Synthroid 88 mcg daily.  8. Toprol XL 12.5 mg daily.  9. Protonix 40 mg p.o. daily.  10.Zocor 20 mg q.h.s.  11.Sotalol 80 mg b.i.d.  12.Warfarin to maintain goal INR of 2 to 3.   SOCIAL HISTORY:  The patient lives by herself in [place], [place]. She has a prior history of smoking but quit approximately 60  years ago. She denies any alcohol or illicit substances.   FAMILY HISTORY:  Noncontributory.   REVIEW OF SYSTEMS:  As per HPI, otherwise complete review of systems is  negative.   PHYSICAL EXAMINATION:  VITAL SIGNS:  Blood pressure 90/64, heart rate is  80, O2 saturations were 96% on room air.  GENERAL:  The patient is alert  and oriented x3, in no acute distress and  pleasantly conversant.  HEENT:  Normocephalic, atraumatic. EOMI, PERL, nares pink, OMP is clear  without erythema or exudate.  NECK:  Supple, full range of motion, no palpable thyromegaly, and no  JVD.  Carotid upstrokes are equal and symmetric bilaterally.  CHEST:  Reveals bilateral crackles over the posterior half of the lung  fields with occasions expiratory wheezing.  CARDIOVASCULAR EXAMINATION:  Normal S1, S2, no audible murmurs, rubs, or  gallops.  Her PMI is nondisplaced in the left mid clavicular line.  Peripheral pulses are 1+ and symmetric bilaterally.  ABDOMEN:  Soft, nontender, nondistended, positive bowel sounds, no  hepatosplenomegaly.  EXTREMITIES:  Reveal no clubbing, cyanosis or edema. There are no rashes  or ulcerations noted.  NEUROLOGIC:  Grossly nonfocal.   DATA:  EKG reveals normal sinus rhythm with an interventricular  conduction  delay. There is evidence of a prior anterior infarct but no  evidence of acute injury or ischemia.  Sodium 141, potassium 4.1,  chloride 109, CO2 is 26, BUN 23, creatinine 1.16, glucose is 128. CBC:  White count is 8.2 thousand, hematocrit 29, platelets 181,000.  INR 3.2,  CK 25, CK-MB 1.1, troponin 0.08.   IMPRESSION:  1. Pneumonia.  2. Chest pain with a history of coronary artery disease.  3. Atrial fibrillation.  4. Hypothyroidism.  5. Hyperlipidemia.   RECOMMENDATIONS:  From a cardiovascular stand-point, [name] is  currently stable and asymptomatic.  On review of her EKG, there is  evidence of prior anterior myocardial infarction however, there is no  evidence of acute injury or ischemia.  On review of her cardiac bio-  makers, her troponin is very mildly elevated with normal CK and CK-MB.  I suspect the reason for her elevated troponin is demand related in the  setting of concomitant pneumonia and/or anemia.  I do not think an  invasive evaluation iw warranted at this time.  I recommend that we  continue with her current medical treatment including aspirin,  Metoprolol, and Simvastatin.  Recommend checking a transthoracic  echocardiogram to reevaluate her left ventricular structure and function  to rule out a new wall motion abnormality.   We appreciate the opportunity to participate in the care of [name]  and we will follow along with you through this hospitalization.      Audery Amel, MD  Electronically Signed     SHG/MEDQ  D:  03/11/2007  T:  03/11/2007  Job:  [id number]

## 2011-01-26 NOTE — Assessment & Plan Note (Signed)
[facility]                            CARDIOLOGY OFFICE NOTE   NAME:Pitre, [name]                    MRN:          [id number]  DATE:02/24/2007                            DOB:          [birth year]    Ms. Kersting is doing well.  She is not having any significant shortness  of breath.  She is not having any syncope or presyncope.  She did have a  spontaneous conjunctival hemorrhage prior to her last visit.  She has  not had any since.  She did have 1 bruise on her arm, and she cut back  her Coumadin dose slightly on her own, and we talked about this today.  She will not adjust it on her own in the future.  It is my understanding  that her current dosing is 4 mg every day of the week except for 6 mg on  Tuesday and Friday.  She takes it in 2 mg tablets.  She does have atrial  fibrillation, and, by history, she has held sinus rhythm with sotalol.   PAST MEDICAL HISTORY:   ALLERGIES:  SULFA.   MEDICATIONS:  1. Aspirin 81.  2. Sotalol 80 b.i.d.  3. Coumadin as directed.  4. Thyroid.  5. Metoprolol 12.5.  6. Simvastatin 5 mg daily.  7. Terbinafine 250 mg daily.  8. Boniva monthly.   OTHER MEDICAL PROBLEMS:  See the list on my note of December 06, 2006.   REVIEW OF SYSTEMS:  She is doing well and her review of systems is  negative.   PHYSICAL EXAMINATION:  Blood pressure 140/82 with a pulse of 60.  Weight  is 132 pounds.  The patient is oriented to person, time, place, and her affect is  normal.  There is no xanthelasma.  There is normal extraocular  motion.  There are no carotid bruits, there is no jugular venous distension.  LUNGS:  Clear.  Respiratory effort is not labored.  CARDIAC EXAM:  Reveals an S1 with an S2.  She has no clicks or  significant murmurs.  Note, there is a soft, systolic murmur.  Her abdomen reveals normal bowel sounds.  She has no peripheral edema.   Problems are listed in my note of December 06, 2006.  Her shortness  of  breath is stable.  Her blood pressure is reasonably controlled.  Her  coronary status is stable.  There is an abnormal CT that had been  followed over time by Dr. Shelle Iron, but this is resolved and there is no  further workup that is needed.  She does have a mildly abnormal Myoview,  but we have watched this carefully, and at this point we are not  proceeding with catheterization.  I will see her back in followup.   ADDENDUM:The patient had her INR checked by Shelby Dubin, and her INR was  in the 1.4 or 1.5 range.  She was told to take 6 mg of Coumadin today,  and Shelby Dubin will be in touch with Dr. Karie Chimera office on Monday.     Luis Abed, MD, [facility]  Electronically Signed  JDK/MedQ  DD: 02/24/2007  DT: 02/25/2007  Job #: [id number]   cc:   Wallace Cullens

## 2011-01-26 NOTE — Assessment & Plan Note (Signed)
[facility]                            CARDIOLOGY OFFICE NOTE   NAME:Walle, CRYSTALLE POPWELL                    MRN:          [id number]  DATE:05/18/2007                            DOB:          [birth year]    Ms. Putnam is here for followup. I saw her last on April 12, 2007. She  is actually doing well. She has some exertional shortness of breath, but  overall she is doing well. She had her O2 sats checked at night and her  oxygenation continues to improve. Her O2 sats today is 96%. Therefore,  she does not need home O2 and she is doing well.   The ongoing issue remains as to whether or not we will try to convert  her back to sinus rhythm. See the discussion below. The patient is not  having chest pain. She has had no syncope or pre-syncope. She has had  some dizziness and she will be seeing ENT for evaluation in the near  future.   PAST MEDICAL HISTORY:   ALLERGIES:  SULFA.   MEDICATIONS:  1. Sotalol 40 b.i.d.  2. Coumadin.  3. B12 injection monthly.  4. Thyroid.  5. Simvastatin.  6. Omeprazole.  7. Cartia.  8. Iron.  9. Calcium.  10.Flonase.   OTHER MEDICAL PROBLEMS:  See the list below.   REVIEW OF SYSTEMS:  See the HPI. She also has some weakness, otherwise  her review of systems is negative.   PHYSICAL EXAMINATION:  Weight is 132 pounds. This is the same as when I  saw her on April 12, 2007. Blood pressure is 98.62, pulse of 66. The  rhythm appears to be irregularly irregular on physical examination.  HEENT: Reveals no xanthelasma. She has normal extraocular motion. There  are no carotid bruits. There is no jugular venous distention.  LUNGS:  Reveals that the patient has a few scattered rhonchi and  possibly a few rales at her base.  CARDIAC: Reveals an S1, with an S2 and a systolic murmur.  ABDOMEN: Soft. There are no masses or bruits. She has normal bowel  sounds.  Today, there is a question of  trace peripheral edema.   No EKG  is done today.   PROBLEM LIST:  Problems are listed extensively on my note of April 12, 2007 and they are reviewed completely.  1. Hypertension. This is not a problem and in fact her blood pressure      is on the low side.  2. Relative hypotension. She is tolerating this and we are not      changing her medications and she is not on diuretics at this point.  3. Chronic lung disease. See the discussion above. She does not      require home O2.  4. Atrial fibrillation. I am willing to consider cardioversion on a      one time occasion. She had held sinus rhythm for several years and      reverted to atrial fibrillation with her recent illness. However,      at this time, she hopes to get home from her current  setting and      get stronger before we consider this further. Her INR needs to be      kept above 2.0.  5. Question of coronary disease. We know she had a borderline abnormal      Myoview in the past. I have still decided not to proceed with      catheterization.  6. Volume status. She remains off of Lasix and her weight remains      stable. I do not know if she has had true body loss. Her volume      status may be slightly increased, but her weight is stable and I      have decided not to restart her Lasix.  7. Thyroid status. This is being followed by Dr.  Milinda Antis.  8. Prolonged QT interval. This is followed and she is on her current      dose of sotalol.   We had a very careful and extensive discussion. I spoke with the patient  and her daughter. I do want to consider cardioversion over time, but we  will not do that at this point. If it is helpful to her, she can have  her Coumadin followed through Dr.  Royden Purl office. It is important to  keep her INR above 2.0. I will see the patient back in six to eight  weeks for further evaluation. No changes in her medications at this  time.     Luis Abed, MD, [facility]  Electronically Signed    JDK/MedQ  DD: 05/18/2007  DT:  05/18/2007  Job #: [id number]   cc:   Marne A. Milinda Antis, MD

## 2011-01-26 NOTE — Assessment & Plan Note (Signed)
[facility]                            CARDIOLOGY OFFICE NOTE   NAME:Branch, [name] TICER                    MRN:          [id number]  DATE:12/18/2007                            DOB:          [birth year]    [name] is here today.  She is doing extremely well.  I saw her  last on July 19, 2007.  Her weight chart shows that her weight is  remaining very stable.  On a rare occasion, she will take Lasix.  She is  here with her daughter as always.  She is tolerating chronic atrial fib.  She is hoping to have a little bit of a garden this year.  She is doing  very well.  She has not had any significant chest pain.  There has been  no syncope or presyncope.   PAST MEDICAL HISTORY:   ALLERGIES:  SULFA.   MEDICATIONS:  Simvastatin, __________ Cosamine daily, lutein, meclizine,  levothyroxine 88 mcg, iron, Coumadin and furosemide on a p.r.n. basis,  cetirizine (Zyrtec) 10 mg daily.   OTHER MEDICAL PROBLEMS:  See the list below.   REVIEW OF SYSTEMS:  She looks and feels great.  She has no significant  problems.   PHYSICAL EXAMINATION:  VITAL SIGNS:  Blood pressure is 119/64 with a  pulse of 73.  GENERAL:  The patient is oriented to person, time and place.  Affect is  normal.  HEENT:  Reveals no xanthelasma.  She has normal extraocular motion.  There are no carotid bruits.  There is no jugulovenous distention.  LUNGS:  Clear.  Respiratory effort is not labored.  CARDIAC:  Reveals a S1-S2.  The rhythm is irregularly irregular.  ABDOMEN:  Soft.  EXTREMITIES:  She has good distal pulses and no significant peripheral  edema.   PROBLEMS:  1. Hypertension.  2. Relative hypotension at times that is now controlled.  3. Chronic lung disease and she does not need home oxygen.  4. Atrial fibrillation.  She had been on sotalol and then we stopped      that.  She has chronic atrial fibrillation with rate control and      she is doing well.  5. Question of  coronary disease.  This issue has come up over time,      including a somewhat abnormal nuclear scan in the past, but I have      chosen not to do a cardiac catheterization.  6. Volume status.  This is now under good control.  7. Thyroid status on levothyroxine.  8. Prolonged QT interval that normalized to 450 milliseconds off of      sotalol.   [name] is doing remarkably well.  I will see her back.     Luis Abed, MD, [facility]  Electronically Signed    JDK/MedQ  DD: 12/18/2007  DT: 12/18/2007  Job #: [id number]   cc:   Marne A. Milinda Antis, MD

## 2011-01-26 NOTE — Assessment & Plan Note (Signed)
[facility]                            CARDIOLOGY OFFICE NOTE   NAME:[name]                      MRN:          [id number]  DATE:03/22/2007                            DOB:          [birth year]    [name] is getting ready to leave the hospital. She is on the  [facility] and I am dictating this summary note. While being here  in the hospital, she has been treated for pneumonia and volume overload.  Her atrial fibrillation has been approached and she has had a partial GI  workup. She was treated for a potential bite infection and a urinary  tract infection and she is improved. The problems listed below help to  outline many of the issues:   1. Pneumonia. The patient was treated with Levaquin. I do not know if      an organism was ever grown.  2. Urinary tract infection. This was also treated with the antibiotics      used.  3. SULFA ALLERGY.  4. Hypertension. In fact, the patient's blood pressure was on the low      side most of the admission.  5. Chronic lung disease.  6. Hypothyroid. Despite trying to obtain a TSH, it is still pending as      of this point today.  7. Coumadin therapy. Her Coumadin was held for a period of time      because I thought we might proceed with cardiac catheterization. I      then decided that this would not be done and her Coumadin was      restarted.  8. Atrial fibrillation. The patient had come in on sinus and then      converted to atrial fibrillation. Dr.  Ladona Ridgel mentioned in a note      that with her prolonged QT sotalol might not be the optimal drug.      The patient's QT varied. Ultimately, it was reasonable and I      decided to restart her sotalol at a lower dose of 40 b.i.d. She      remained in atrial fibrillation. I considered increasing the dose      back to 80 while she was in the hospital at 80 b.i.d. and then      considering cardioversion. However, because her Coumadin dose had    been lowered for a while, I decided to follow her as an outpatient      and then decide about drug dosing and the timing of a possible      cardioversion. She might have to be admitted to increase the      sotalol dose back up and then proceed with cardioversion.  9. Good LV function. The patient had an echo even though I cannot find      it in the palm today. EF was reported as 50-55%.  10.Mild right ventricular dysfunction.  11.Small pericardial effusion.  12.Large pleural effusion. The chest x-rays over time show that with      diuresis the effusions were improving. They were not gone  completely as of the last film. There was also mention of      significant mitral regurgitation. It was read as possibly severe.  13.History of an abnormal chest x-ray over time that had been followed      by Dr.  Shelle Iron and in the past did not need any further followup.  14.Anemia. The patient received a B12 shot monthly. She had had a      colonoscopy in the fall of 2007 and she was seen by GI. Ultimately,      it was decided that upper endoscopy could be considered, but was      not absolutely necessary. I decided to stop her aspirin, but leave      her on Coumadin. Her hemoglobin stabilized in the range of 11. If      endoscopy is needed at a later date, it could be done while she is      on Coumadin according to GI doctor, Dr.  Christella Hartigan.  15.Question coronary disease. Because she had had a borderline Myoview      scan, I considered catheterization. However, she had other reasons      for being ill and ultimately I decided not to proceed with      catheterization.  16.Question of a tick bite. The patient was treated with the      appropriate medications for this.  17.Volume status. The patient was clearly volume overloaded. She had      received fluids for her pneumonia when she first came in and this      may have played a role. However, I believe overall total body      volume was increased.  She diuresed and stabilized. I am sending her      home on low dose Lasix, which she was not on admission.  18.Question of depression. After we spoke at length many times, it was      clear that she was not particularly depressed.  19.Renal status. Her creatinine was in the 0.9 range and then with      diuresis, it increased to as high as 1.2. I held her diuretics      yesterday and she is back to 1.1 and she will be followed at this      level. She will go home on a diuretic.  20.Mild elevation of her LFTs. She had this when she was admitted and      these improved.  21.Thyroid status. Her TSH is still pending.  22.Mouth ulcers. She was given local treatment for this.  23.Guaiac positive stool. See the discussion above about the GI      evaluation. It is of note that one stool was positive and one was      negative. She will be followed clinically.  24.Prolonged QT interval. This is being followed.  25.Low potassium. This occurred only when she was diuresed.   The patient will be discharged by the primary care team that admitted  her. She will followup with Dr.  Wallace Cullens and I will see her for  cardiology followup.     Luis Abed, MD, [facility]  Electronically Signed    JDK/MedQ  DD: 03/22/2007  DT: 03/22/2007  Job #: [id number]

## 2011-01-26 NOTE — Discharge Summary (Signed)
[name], [name]             ACCOUNT NO.:  [id number]   MEDICAL RECORD NO.:  [id number]          PATIENT TYPE:  INP   LOCATION:  3713                         FACILITY:  [facility]   PHYSICIAN:  Hind I Elsaid, MD      DATE OF BIRTH:  04/14/1922   DATE OF ADMISSION:  03/10/2007  DATE OF DISCHARGE:  03/22/2007                               DISCHARGE SUMMARY   DISCHARGE MEDICATIONS:  1. Coumadin 5 mg p.o. daily.  To be followed with [facility].  2. Sotalol 40 mg p.o. b.i.d.  3. Synthroid 100 mcg p.o. daily.  4. Simvastatin 10/20 mg p.o. daily.  5. Iron 325 mg p.o. daily.  6. Metoprolol 12.5 mg p.o. daily.  7. Protonix 40 mg p.o. daily.  8. Cardizem CD 120 mg p.o. daily.  9. Lasix 40 mg p.o. daily.   CONSULTATIONS:  1. Cardiology was consulted for the AFib and chest pain.  2. Gastroenterology was consulted for the anemia with 1 stool positive      for guaiac.   PROBLEMS:  1. Chronic atrial fibrillation and chest pain.  Dr. Myrtis Ser was consulted      where the patient was restarted on sotalol 40 mg p.o. b.i.d. and      the patient kept on tele for watch of QT prolongation, and her      Coumadin dose was adjusted per cardiology.  As per Dr. Myrtis Ser, the      patient did not need any cardiac cath at this time, and she will      follow with him for further recommendation as an outpatient.  2. Congestive heart failure.  The patient continued on Lasix with good      response.  The patient to be discharged on Lasix 40 mg p.o. daily      and follow the creatinine and electrolytes with Dr. Myrtis Ser.  3. Anemia.  The patient has 1 result of guaiac positive stool.  The      other was negative.  Gastroenterology was consulted and they did      not think the patient needed any procedure this time.  Continue      with Protonix.  No need for EGD at this time.  If there is any      concern with the anticoagulation, EGD workup can be done as an      outpatient.  Hemoglobin remaining  stable at this time.  4. Hypothyroidism.  TSH final results show as 8.17, so the patient is      hypothyroidismal with increase in Synthroid to 100 mcg p.o. every      a.m. and further recommendation as per primary care.  Would      recommend the patient to follow TSH and free T4 within 6 weeks.      Hind Bosie Helper, MD  Electronically Signed    HIE/MEDQ  D:  03/22/2007  T:  03/23/2007  Job:  [id number]   cc:   Luis Abed, MD, [facility]

## 2011-01-26 NOTE — Assessment & Plan Note (Signed)
[facility]                            CARDIOLOGY OFFICE NOTE   NAME:Branch, [name] CONLEY                    MRN:          [id number]  DATE:03/31/2007                            DOB:          09/20/1921    PRIMARY CARDIOLOGIST:  Luis Abed, MD, [facility].   PRIMARY CARE PHYSICIAN:  Dr. Wallace Cullens.   [name] is an 77 year old female, patient of Dr. Myrtis Ser.  She was  hospitalized from June 27 to March 22, 2007 for atrial fibrillation,  shortness of breath, pneumonia, and was evaluated by cardiology for  chest pressure.  Her cardiac enzymes were negative for MI and medical  therapy was recommended.  She is here today in followup.   [name], since discharge, was weak for a while, but over the last  few days she feels that she has gotten consistently stronger.  After she  was discharged from the hospital, her systolic blood pressure was low,  at times in the 80s when checked by home health.  Her Lasix was held  after that.  Additionally, she was on metoprolol prior to admission, and  this has been discontinued.  She has a weight chart with her today and  her weight has not varied by more than 2 pounds since she started  tracking it on July 10.  She feels that she has improved significantly  and denies chest pain or shortness of breath.   CURRENT MEDICATIONS:  1. Sotalol 40 mg b.i.d.  2. Coumadin as directed.  3. Cosamin DS daily.  4. B12 injection q. month.  5. Calcium Plus D 600 mg a day.  6. Multivitamin daily.  7. [name] is currently on hold.  8. Levothyroxine 100 mcg daily.  9. Simvastatin 20 mg 1/2 tab daily.  10.Omeprazole 20 mg a day.  11.Cartia XT 120 mg daily.  12.Iron daily.  13.Allergy tablet daily.  14.Lasix 40 mg is currently on hold.   PHYSICAL EXAM:  VITAL SIGNS:  Her weight is 134 pounds, which is 2  pounds higher than it was on June 13.  Per her weight chart she has  varied between 128.2 and 130.4 pounds, and today at  home was 129.  Her  blood pressure is 124/67, pulse 67 and regular.  GENERAL:  She is a frail, elderly white female in no acute distress.  NECK:  There is no JVD.  No thyromegaly.  No carotid bruits are  appreciated.  LUNGS:  She has some fine rales in both bases, but no crackles or  wheezing is noted.  CARDIOVASCULAR:  Her heart is regular rate and rhythm with an S1, S2,  and no significant murmur, rub, or gallop is noted.  ABDOMEN:  Soft and nontender with active bowel sounds.  EXTREMITIES:  No cyanosis, clubbing, or edema and distal pulses are  intact.   IMPRESSION:  1. Coronary artery disease:  Since her respiratory status improved and      she recovered from her acute illness, she has had no chest pain.      She feels that her strength is increasing and her activity  level is      increasing without any ischemic symptoms.  2. Anticoagulation:  Her Coumadin was checked today and was 2.5.  She      is to continue followup with the [facility] as scheduled.  3. History of atrial fibrillation:  Her heart is regular rate and      rhythm.  No EKG was done today because she is asymptomatic.  She is      tolerating the sotalol and the Cartia well.  No medication changes      will be made.  4. Hypertension.  Her blood pressure is well-controlled and she is      having no issues with volume overload off of the Lasix.  I      discussed the situation with her and her daughter.  I wrote a note      stating that, if her weight increases by 3 pounds in a day or 5      pounds in a week, she is to take 1/2 of a Lasix tablet.  If her      weight does not improve by the next day, she is to take the other      half.  She is to contact us if she is taking more than 3 tablets a      week.   [name] currently has a followup appointment arranged with Dr.  Myrtis Ser.  She will see how she is doing and, if she needs close follow up,  she will keep that appointment.  Otherwise, she will reschedule it  for 2  months.      Theodore Demark, PA-C  Electronically Signed      Pricilla Riffle, MD, [facility]  Electronically Signed   RB/MedQ  DD: 03/31/2007  DT: 04/01/2007  Job #: [id number]

## 2011-01-29 NOTE — Assessment & Plan Note (Signed)
[facility]                            CARDIOLOGY OFFICE NOTE   NAME:[name], [name]                    MRN:          [id number]  DATE:10/31/2006                            DOB:          1921/10/04    [name] is seen for followup.  I saw her last on September 19, 2006.  Her shortness of breath had improved with beta blocker.  Her very low-  dose metoprolol was changed to Coreg.  She is taking half of a 3.125 mg  tablet and it is too small to split.  She may have had some nausea from  metoprolol, but I am not convinced that this was the offender.  Her  blood pressure is running on the low side.  She has had some mild  dizziness, but no syncope or presyncope.   PAST MEDICAL HISTORY:   ALLERGIES:  SULFA DRUGS.   MEDICATIONS:  1. Aspirin.  2. Sotalol 80 mg b.i.d. and over time, we will consider whether this      dose should be reduced.  3. Coumadin.  4. Thyroid.  5. Coreg one-half of a 3.125 mg b.i.d.   OTHER MEDICAL PROBLEMS:  See the complete list on my note of August 16, 2006.   REVIEW OF SYSTEMS:  Other than some mild fullness in her sinuses in her  head, she is feeling well.   Her review of systems otherwise is negative.   PHYSICAL EXAMINATION:  Blood pressure is significantly lower than usual  at 82/50 and her pulse is 80.  The patient is oriented to person, time and place and her affect is  normal.  There is no xanthelasma.  She has normal extraocular motion.  No jugular venous distention.  There are no carotid bruits.  LUNGS:  Clear.  Respiratory rate is not labored.  CARDIAC:  Exam reveals an S1 and S2.  There are no clicks or significant  murmurs.  ABDOMEN:  Soft.  She has no significant peripheral edema.   PROBLEMS:  1. Shortness of breath.  This appears to be helped by beta blockade.      We will start her back on 12.5 mg of extended-release metoprolol      daily.  2. Hypertension.  For some reason, her blood  pressure is now on the      low side.  We will follow this carefully.   I have recommended medicines such as Claritin (loratadine) or Zyrtec for  her nasal stuffiness, but not a medication that includes a  catecholamine.     Luis Abed, MD, [facility]  Electronically Signed    JDK/MedQ  DD: 10/31/2006  DT: 11/01/2006  Job #: [id number]

## 2011-01-29 NOTE — Assessment & Plan Note (Signed)
[facility]                              CARDIOLOGY OFFICE NOTE   NAME:Swager, ORTENCIA ASKARI                    MRN:          [id number]  DATE:07/22/2006                            DOB:          21-Sep-1921    Ms. Dejarnett is seen for cardiology followup.  She is doing very well.  She  is 77 years of age.  She is not having any syncope or presyncope.  She has  no chest pain.  She is having some exertional shortness of breath which is  increasing over time.  It is possible that this could be an anginal  equivalent.  She is doing very well.  She is here with her daughter today.   The patient does have coronary disease.  She received 2 Cipher stents at  [facility] in December 2004.  She had an echo in 2006.  This was done in our  office and shows that she had an injection fraction 55% to 65%.  She does  have mild or mild to moderate mitral regurgitation.   The patient also had an abdominal ultrasound in November 2006 because she  has a palpable aorta.  There was no evidence of abdominal aortic aneurysm.  The patient has had very infrequent palpitations.  It does not sound like  she is having any significant atrial fibrillation.   PAST MEDICAL HISTORY:   ALLERGIES:  SULFA   MEDICATIONS:  1. Aspirin 81.  2. Sotalol 80 b.i.d.  3. Coumadin as directed.  4. Thyroid 88 mcg.  5. Meclizine p.r.n.  6. Vitamins.   OTHER MEDICAL PROBLEMS:  See the complete list below.   REVIEW OF SYSTEMS:  The patient is not having any significant GI or GU  symptoms.  She has rare dizziness.  We will refill her meclizine.  Review of  systems otherwise is negative.   PHYSICAL EXAMINATION:  The patient is well-developed and well-nourished.  She is oriented to person, time and place.  Affect is normal.  LUNGS:  Are clear.  Respiratory effort is not labored.  HEENT:  Reveals no xanthelasma.  She has normal extraocular motion.  There  are no carotid bruits.  There is no jugular  venous distention.  CARDIAC:  Reveals an S1 with an S2.  There is a soft systolic murmur.  ABDOMEN:  Is soft.  Her aorta is palpable. There are no masses or bruits.  Bowel sounds are normal.  She has no peripheral edema.  She has 2+ distal pulses.   No labs are done.   PROBLEMS:  Include:  1. Coronary disease.  The patient is status post Cipher stent x2.  She is      having some shortness of breath.  We will proceed with a stress      Myoview.  I will try to see if she can walk on the treadmill.  2. History of emphysema by history.  This may of course be playing a role      in her shortness of breath.  3. Hypothyroidism on replacement.  4. History of allergy to SULFA.  5. History  of an abnormal chest CT followed by Dr. Shelle Iron in our group      historically.  6. Atrial fibrillation. She is on sotalol and remaining in sinus rhythm.      Her rhythm on physical exam is quite regular.  This will be      redocumented by EKG when she has her stress test.  Sotalol will be      continued.  7. Mild or mild to moderate mitral regurgitation.  This will be followed.      She does not need an echo this year.  8. History of ejection fraction of 55% to 65%.  9. Question of a carotid bruit in the past.  I did not hear one today.      Dopplers done at [facility] in the past showed no major abnormality.  10.Hypertension.  Her blood pressure is stable.   The patient is doing very well.  For her coronary disease it would be  optimal for her to be on a statin.  We will start her on low dose  simvastatin.  I would like for her to be on a statin regardless of her LDL,  if this is possible, for the long term treatment of her coronary disease.  She is on low dose aspirin.   We will see her back for her stress test.  She will be started on 10 mg of  Zocor at this time.     Luis Abed, MD, [facility]  Electronically Signed    JDK/MedQ  DD: 07/22/2006  DT: 07/23/2006  Job #: [id number]   cc:   Wallace Cullens

## 2011-01-29 NOTE — Assessment & Plan Note (Signed)
[facility]                            CARDIOLOGY OFFICE NOTE   NAME:Vanzile, JOSEPH JOHNS                    MRN:          [id number]  DATE:09/19/2006                            DOB:          September 10, 1922    Ms. Fread is seen for followup.  I had a long and careful discussion  with her and her daughter.  When I saw her on August 16, 2006, we  talked seriously about catheterization.  She was having exertional  shortness of breath that had been increasing.  I was concerned that it  might be an anginal equivalent.  We decided to seriously consider  catheterization for more information.  In the meantime, I did start her  on a beta blocker.  In fact, she is significantly better.  She is  walking to her mailbox without any significant difficulties.  We know  that she had 2 CYPHER stents at [facility] in December of 2004.  Also, we know  that in 2006 her echo showed an ejection fraction of 55-65% with  moderate mitral regurgitation.  Her Myoview scan had also showed a  normal ejection fraction.  There was evidence of prior inferolateral  infarct with mild to moderate peri-infarct ischemia.  I believe,  however, that her stents were in the distribution of her LAD.  She is  feeling well.   PAST MEDICAL HISTORY:   ALLERGIES:  SULFA DRUGS.   MEDICATIONS:  1. Aspirin.  2. Sotalol 80 b.i.d.  3. Coumadin.  4. Thyroid.  5. Meclizine.  6. Simvastatin 10.  7. Metoprolol 12.5.   OTHER MEDICAL PROBLEMS:  See the list below.   REVIEW OF SYSTEMS:  She is feeling well and stable.  Review of systems  otherwise is negative.   PHYSICAL EXAM:  Blood pressure is 135/72, pulse is 64.  The patient is oriented to person, time, and place and her affect is  normal.  LUNGS:  Clear.  Respiratory effort is not labored.  CARDIAC:  S1 with an S2.  There are no clicks or significant murmurs.  ABDOMEN:  Soft.  There are no masses or bruits.  She has no xanthelasma.  She has no  carotid bruits.  There is no jugular venous distension.  There is no peripheral edema.   See the problem list of August 16, 2006.  1. Shortness of breath.  This does appear to be an anginal equivalent,      and she is helped by the beta blocker.  2. History of 2 CYPHER stents at [facility] in the left anterior descending      distribution.   The patient has a question of ischemia and scar in the inferolateral  area.  She seems to be helped by beta blocker.  I had a long discussion  with the patient and her daughter.  Since her symptoms are improved, I  think it is most appropriate to treat her medically at this point.  I  will see her back in 6 weeks and will continue to discuss these issues.     Luis Abed, MD, [facility]  Electronically Signed  JDK/MedQ  DD: 09/19/2006  DT: 09/19/2006  Job #: [id number]   cc:   Wallace Cullens

## 2011-01-29 NOTE — Assessment & Plan Note (Signed)
[facility]                            CARDIOLOGY OFFICE NOTE   NAME:[name], [name]                    MRN:          [id number]  DATE:08/16/2006                            DOB:          1921/10/06    [name] is seen for followup. I saw her last in the office on  July 22, 2006. She was doing well. She had no syncope or presyncope.  She does have some exertional shortness of breath that has been  increasing over time. I did consider whether this could be an anginal  equivalent or not. She has not had significant chest pain. We know that  she has coronary disease. She had 2 Cypher stents at [facility] in December  2004. In 2006, her echo showed an ejection fraction of 55-65%. She does  have mild to moderate mitral regurgitation. She has no evidence of  abdominal aortic aneurysm.   I decided to proceed with a screening Myoview scan. The patient received  IV adenosine. She did have some EKG changes. Her nuclear images revealed  prior inferolateral infarct with mild to moderate periinfarct ischemia  and her ejection fraction was 67%.   The patient is seen back today and I had a very careful and complete  discussion with her and her daughter. I showed them the nuclear images.  We reviewed the fact that her prior intervention was to the LAD. The  current findings are in the distribution of her circumflex. There  appears to be scar and ischemia.   PAST MEDICAL HISTORY:   ALLERGIES:  SULFA drugs.   MEDICATIONS:  1. Aspirin.  2. Sotalol 80 b.i.d.  3. Coumadin.  4. Levothyroxine 88 mcg.  5. Meclizine.  6. Simvastatin.   OTHER MEDICAL PROBLEMS:  See the list below.   REVIEW OF SYSTEMS:  She really is not having any other significant  symptoms at this time and her review of systems otherwise is negative.   PHYSICAL EXAMINATION:  VITAL SIGNS:  Weight is 132, blood pressure  140/76 with a pulse of 69.  GENERAL:  The patient is oriented to  person, time and place and her  affect is normal. Her daughter who is also very knowledgeable is here at  the time of the evaluation.  HEENT:  Reveals no xanthelasma. She has normal extraocular motion. There  are no carotid bruits. There is no jugular venous distention.  CARDIAC:  Reveals a soft systolic murmur.  ABDOMEN:  Soft. There are no masses or bruits. There are normal bowel  sounds.  EXTREMITIES:  There is no significant peripheral edema.   PROBLEM LIST:  1. Shortness of breath. I am concerned that this may be an anginal      equivalent.  2. History of two Cypher stents at [facility] which I believe went to her      left anterior descending.  3. History of SULFA allergy.  4. History of emphysema by history. This may play a role in her      shortness of breath.  5. Hypothyroidism on replacement.  6. History of abnormal chest CT followed by Dr.  Clance in our group      historically.  7. Atrial fibrillation. She holds sinus on sotalol.  8. Mild to moderate mitral regurgitation.  9. Ejection fraction of 55-65%.  10.Question of carotid bruit in the past and I do not hear once a day.      She had no abnormality at [facility] in the past.  11.Hypertension is controlled.   With her abnormal Myoview scan after very careful discussion, we have  decided to proceed with an outpatient catheterization. I feel this is  not urgent. She would prefer to wait until after the new year. I will  see her back on January 7 for careful followup and then planning of  catheterization. In the meantime, we will start low dose beta blocker to  see how she feels.     Luis Abed, MD, [facility]  Electronically Signed    JDK/MedQ  DD: 08/16/2006  DT: 08/17/2006  Job #: [id number]   cc:   Wallace Cullens

## 2011-01-29 NOTE — Assessment & Plan Note (Signed)
[facility]                            CARDIOLOGY OFFICE NOTE   NAME:Branch, [name] CADENA                    MRN:          [id number]  DATE:06/18/2008                            DOB:          Jun 14, 1922    I have had the patient wear a Holter monitor to assess her heart rates.  It turns out in fact that she does have increased heart rate starting  around 9 a.m. in the morning.  Her rates will range from 80-144.  Her  average rate is significantly elevated on the day of this study with  averages at 10 a.m. 120; 11 a.m. 121; 12 p.m. 132; 1 p.m. down to 113.  She needs a better rate control.  She is on some Cardizem.  She had been  on sotalol in the past and it was stopped.  We will restart a low dose  of a beta-blocker and then I will arrange for followup.  Also we will be  sure that a TSH has been checked.     Luis Abed, MD, [facility]  Electronically Signed    JDK/MedQ  DD: 06/18/2008  DT: 06/18/2008  Job #: [id number]

## 2011-01-29 NOTE — Assessment & Plan Note (Signed)
[facility]                            CARDIOLOGY OFFICE NOTE   NAME:Branch, [name]                    MRN:          [id number]  DATE:12/06/2006                            DOB:          01/08/1922    Ms. [name] is seen for follow up.  On October 31, 2006, we had  switched her Coreg back to low dose Metoprolol.  She feels better with  this.  She is able to walk and she is doing well.  She has had no  syncope or presyncope.  She had a spontaneous subconjunctival hemorrhage  in her right eye, which is recovering.  Her blood pressure when checked  at church recently was in the normal range.  When I saw her last it was  low and I encouraged her to go ahead and eat some salt and liquids and  in fact her pressure is a little high today.   ALLERGIES:  SULFA.   MEDICATIONS:  1. aspirin.  2. Sotalol 80 mg b.i.d.  3. Warfarin.  4. Levothyroxine.  5. Vitamins.  6. __________  6 mg.  7. Simvastatin 10 mg.  8. Calcium.  9. Metoprolol 12.5 mg daily.  10.Meclizine p.r.n.   OTHER MEDICAL PROBLEMS:  See the list below.   REVIEW OF SYSTEMS:  Of course she is concerned about her eye, but this  is stabilizing.  Overall her review of systems is negative and she is  feeling well.  She is here with her daughter today.   PHYSICAL EXAMINATION:  Weight is 131.  Blood pressure is 150/71 with a  pulse of 74.  The patient is oriented to person, time and place.  Affect is normal.  She has a subconjunctival hemorrhage in her right eye.  There is no  xanthelasma.  There are no carotid bruits.  There is no jugular venous  distension.  LUNGS:  Clear.  Respiratory effort is not labored.  CARDIAC:  Reveals an S1 with an S2 and soft systolic murmur.  ABDOMEN:  Soft, there are no masses or bruits.  She has no significant  peripheral edema.   No labs are done today.   Problems include:  1. Shortness of breath.  I believe that this is not an anginal  equivalent.  She feels better on low dose beta blockade controlling      her heart rate and she is stable.  2. Hypertension.  Her pressure was low of the time of the last visit      and her systolic is a little high today.  I told her to modify her      salt and fluid intake and to have her pressure checked regularly as      she does.  3. History of 2 Cypher stents at [facility] to the LAD, I believe in the      past.  4. Sulfa allergy.  5. History of emphysema.  6. Hypothyroidism on replacement.  7. History of a abnormal chest CT followed by Dr. __________      historically.  8. Atrial fibrillation.  She holds sinus on Sotalol.  9. Mild to moderate mitral regurgitation.  10.Ejection fraction 55%-65%.  11.Question of a bruits heard once, but no abnormality in the past at      [facility].   Mildly abnormal Myoview.  This study did show prior inferolateral  infarct with some mild to moderate peri infarct ischemia, but we are  watching this carefully.  I had considered catheterization, but we  decided to follow her medically.   I believe her overall cardiac status is stable.  No other changes at  this time.  However, I will see her back in 3 months.     Luis Abed, MD, [facility]  Electronically Signed    JDK/MedQ  DD: 12/06/2006  DT: 12/06/2006  Job #: [id number]   cc:   Wallace Cullens

## 2011-02-04 ENCOUNTER — Ambulatory Visit (INDEPENDENT_AMBULATORY_CARE_PROVIDER_SITE_OTHER): Payer: Medicare Other | Admitting: Family Medicine

## 2011-02-04 ENCOUNTER — Telehealth: Payer: Self-pay | Admitting: Cardiology

## 2011-02-04 ENCOUNTER — Telehealth: Payer: Self-pay

## 2011-02-04 DIAGNOSIS — Z7901 Long term (current) use of anticoagulants: Secondary | ICD-10-CM

## 2011-02-04 DIAGNOSIS — Z5181 Encounter for therapeutic drug level monitoring: Secondary | ICD-10-CM

## 2011-02-04 DIAGNOSIS — I4891 Unspecified atrial fibrillation: Secondary | ICD-10-CM

## 2011-02-04 DIAGNOSIS — E538 Deficiency of other specified B group vitamins: Secondary | ICD-10-CM

## 2011-02-04 LAB — POCT INR: INR: 2.4

## 2011-02-04 MED ORDER — SIMVASTATIN 20 MG PO TABS: ORAL_TABLET | ORAL | Status: DC

## 2011-02-04 MED ORDER — METOPROLOL TARTRATE 50 MG PO TABS: 25.0000 mg | ORAL_TABLET | Freq: Two times a day (BID) | ORAL | Status: DC

## 2011-02-04 MED ORDER — CYANOCOBALAMIN 1000 MCG/ML IJ SOLN
1000.0000 ug | Freq: Once | INTRAMUSCULAR | Status: AC
  Administered 2011-02-04: 1000 ug via INTRAMUSCULAR

## 2011-02-04 NOTE — Telephone Encounter (Signed)
Confirmed with pharmacist at [facility] electronic rx for Simvastatin was received.

## 2011-02-04 NOTE — Telephone Encounter (Signed)
rx sent in to pharmacy. Pt notified

## 2011-02-04 NOTE — Telephone Encounter (Signed)
Refill requested for Metoprolol and simvastatin @ [facility] [address] [place]

## 2011-02-04 NOTE — Progress Notes (Signed)
B-12 given Right deltoid

## 2011-02-04 NOTE — Telephone Encounter (Signed)
Pt's daughter, Jamesetta So said [facility] [address] told her we were not responding to refill request for pt's simvastatin or metoprolol. I spoke with Albin Felling at [facility] and she said refill request was sent to our office electronically on 02/01/11 for Simvastatin. The Metoprolol request went to Dr Myrtis Ser. I could not see and had Rene Kocher ck also and could not find [facility]. I called Jamesetta So and apologized for confusion gave her above info and told her med would be sent to [facility]. I confirmed with Jamesetta So how pt was taking Simvastatin 20mg  which was 1/4 tablet at hs.(this is how listed in [facility] also.) I updated our med list when I sent refill to [facility]. Jamesetta So was appreciative and will contact Dr Myrtis Ser office about Metoprolol.

## 2011-02-05 ENCOUNTER — Ambulatory Visit: Payer: Medicare Other

## 2011-02-05 ENCOUNTER — Telehealth: Payer: Self-pay | Admitting: *Deleted

## 2011-02-05 MED ORDER — METOPROLOL TARTRATE 50 MG PO TABS: 25.0000 mg | ORAL_TABLET | Freq: Two times a day (BID) | ORAL | Status: DC

## 2011-02-05 NOTE — Telephone Encounter (Signed)
Pt's daughter called stating pt needed a 90 day supply of metoprolol but was given a 30 day supply called pharmacy and gave ok for 90 day supply, pt's daughter is aware

## 2011-02-12 ENCOUNTER — Ambulatory Visit: Payer: Medicare Other

## 2011-02-24 ENCOUNTER — Ambulatory Visit (INDEPENDENT_AMBULATORY_CARE_PROVIDER_SITE_OTHER): Payer: Medicare Other | Admitting: Family Medicine

## 2011-02-24 DIAGNOSIS — I4891 Unspecified atrial fibrillation: Secondary | ICD-10-CM

## 2011-02-24 DIAGNOSIS — Z7901 Long term (current) use of anticoagulants: Secondary | ICD-10-CM

## 2011-02-24 DIAGNOSIS — Z5181 Encounter for therapeutic drug level monitoring: Secondary | ICD-10-CM

## 2011-02-24 NOTE — Patient Instructions (Signed)
5 mg daily, held x 2 days for dental surgery tomorrow. Will restart coumadin as directed from dentist. Recheck in 4 weeks

## 2011-03-24 ENCOUNTER — Other Ambulatory Visit: Payer: Self-pay | Admitting: *Deleted

## 2011-03-24 ENCOUNTER — Ambulatory Visit (INDEPENDENT_AMBULATORY_CARE_PROVIDER_SITE_OTHER): Payer: Medicare Other | Admitting: Family Medicine

## 2011-03-24 DIAGNOSIS — Z7901 Long term (current) use of anticoagulants: Secondary | ICD-10-CM

## 2011-03-24 DIAGNOSIS — Z5181 Encounter for therapeutic drug level monitoring: Secondary | ICD-10-CM

## 2011-03-24 DIAGNOSIS — I4891 Unspecified atrial fibrillation: Secondary | ICD-10-CM

## 2011-03-24 DIAGNOSIS — E538 Deficiency of other specified B group vitamins: Secondary | ICD-10-CM

## 2011-03-24 MED ORDER — MECLIZINE HCL 25 MG PO TABS
25.0000 mg | ORAL_TABLET | Freq: Three times a day (TID) | ORAL | Status: AC | PRN
Start: 2011-03-24 — End: 2012-03-23

## 2011-03-24 MED ORDER — CYANOCOBALAMIN 1000 MCG/ML IJ SOLN
1000.0000 ug | Freq: Once | INTRAMUSCULAR | Status: AC
  Administered 2011-03-24: 1000 ug via INTRAMUSCULAR

## 2011-03-24 NOTE — Patient Instructions (Signed)
Continue 5 mg daily, recheck 4 weeks 

## 2011-03-24 NOTE — Telephone Encounter (Signed)
Patient's daughter, Suan Halter notified as instructed by telephone. Medication phoned to [facility] pharmacy as instructed.

## 2011-03-24 NOTE — Telephone Encounter (Signed)
She does have hx of intermittent vertigo  Px written for call in  -- meclizine (did not know which pharmacy)  If worse or not imp in several days f/u please  Watch out for sedation with this med

## 2011-03-24 NOTE — Telephone Encounter (Signed)
Pt c/o dizziness x 2 weeks, she has taken meclizine in the past and it did help, she request a refill.

## 2011-03-24 NOTE — Progress Notes (Signed)
  Subjective:    Patient ID: [name], female    DOB: [birth year], 77 y.o.   MRN: [id number]  HPI    Review of Systems     Objective:   Physical Exam        Assessment & Plan:  B-12 injection given in left deltoid today at nurse visit

## 2011-04-07 ENCOUNTER — Encounter: Payer: Self-pay | Admitting: Cardiology

## 2011-04-09 ENCOUNTER — Other Ambulatory Visit: Payer: Self-pay | Admitting: *Deleted

## 2011-04-09 MED ORDER — FUROSEMIDE 20 MG PO TABS: 20.0000 mg | ORAL_TABLET | Freq: Every day | ORAL | Status: DC

## 2011-04-09 NOTE — Telephone Encounter (Signed)
pts daughter called to say pt needed refill on lasix rx sent in

## 2011-05-04 ENCOUNTER — Encounter: Payer: Self-pay | Admitting: Cardiology

## 2011-05-04 DIAGNOSIS — I2510 Atherosclerotic heart disease of native coronary artery without angina pectoris: Secondary | ICD-10-CM | POA: Insufficient documentation

## 2011-05-04 DIAGNOSIS — I340 Nonrheumatic mitral (valve) insufficiency: Secondary | ICD-10-CM | POA: Insufficient documentation

## 2011-05-04 DIAGNOSIS — I447 Left bundle-branch block, unspecified: Secondary | ICD-10-CM | POA: Insufficient documentation

## 2011-05-04 DIAGNOSIS — R000 Tachycardia, unspecified: Secondary | ICD-10-CM | POA: Insufficient documentation

## 2011-05-04 DIAGNOSIS — E785 Hyperlipidemia, unspecified: Secondary | ICD-10-CM | POA: Insufficient documentation

## 2011-05-04 DIAGNOSIS — Z7901 Long term (current) use of anticoagulants: Secondary | ICD-10-CM | POA: Insufficient documentation

## 2011-05-04 DIAGNOSIS — E8770 Fluid overload, unspecified: Secondary | ICD-10-CM | POA: Insufficient documentation

## 2011-05-04 DIAGNOSIS — I071 Rheumatic tricuspid insufficiency: Secondary | ICD-10-CM | POA: Insufficient documentation

## 2011-05-04 DIAGNOSIS — I4891 Unspecified atrial fibrillation: Secondary | ICD-10-CM | POA: Insufficient documentation

## 2011-05-04 DIAGNOSIS — R9430 Abnormal result of cardiovascular function study, unspecified: Secondary | ICD-10-CM | POA: Insufficient documentation

## 2011-05-04 DIAGNOSIS — R0602 Shortness of breath: Secondary | ICD-10-CM | POA: Insufficient documentation

## 2011-05-04 DIAGNOSIS — R9431 Abnormal electrocardiogram [ECG] [EKG]: Secondary | ICD-10-CM | POA: Insufficient documentation

## 2011-05-04 DIAGNOSIS — J984 Other disorders of lung: Secondary | ICD-10-CM | POA: Insufficient documentation

## 2011-05-05 ENCOUNTER — Other Ambulatory Visit: Payer: Self-pay

## 2011-05-05 MED ORDER — FLUTICASONE PROPIONATE 50 MCG/ACT NA SUSP: 2.0000 | Freq: Every day | NASAL | Status: DC

## 2011-05-05 NOTE — Telephone Encounter (Signed)
[facility] faxed refill for Fluticasone prop spray. #16 g x 1 refill. Pt already scheduled CPX with Dr Milinda Antis on 07/09/11.

## 2011-05-07 ENCOUNTER — Ambulatory Visit (INDEPENDENT_AMBULATORY_CARE_PROVIDER_SITE_OTHER): Payer: Medicare Other | Admitting: Cardiology

## 2011-05-07 ENCOUNTER — Encounter: Payer: Self-pay | Admitting: Cardiology

## 2011-05-07 ENCOUNTER — Ambulatory Visit (INDEPENDENT_AMBULATORY_CARE_PROVIDER_SITE_OTHER): Payer: Medicare Other | Admitting: Family Medicine

## 2011-05-07 DIAGNOSIS — R0602 Shortness of breath: Secondary | ICD-10-CM

## 2011-05-07 DIAGNOSIS — E8779 Other fluid overload: Secondary | ICD-10-CM

## 2011-05-07 DIAGNOSIS — Z7901 Long term (current) use of anticoagulants: Secondary | ICD-10-CM

## 2011-05-07 DIAGNOSIS — R634 Abnormal weight loss: Secondary | ICD-10-CM | POA: Insufficient documentation

## 2011-05-07 DIAGNOSIS — E538 Deficiency of other specified B group vitamins: Secondary | ICD-10-CM

## 2011-05-07 DIAGNOSIS — I4891 Unspecified atrial fibrillation: Secondary | ICD-10-CM

## 2011-05-07 DIAGNOSIS — Z5181 Encounter for therapeutic drug level monitoring: Secondary | ICD-10-CM

## 2011-05-07 DIAGNOSIS — I2510 Atherosclerotic heart disease of native coronary artery without angina pectoris: Secondary | ICD-10-CM

## 2011-05-07 DIAGNOSIS — E8770 Fluid overload, unspecified: Secondary | ICD-10-CM

## 2011-05-07 LAB — CBC WITH DIFFERENTIAL/PLATELET
Basophils Absolute: 0 10*3/uL (ref 0.0–0.1)
HCT: 39 % (ref 36.0–46.0)
Hemoglobin: 13 g/dL (ref 12.0–15.0)
Lymphs Abs: 1.5 10*3/uL (ref 0.7–4.0)
MCV: 94.6 fl (ref 78.0–100.0)
Monocytes Absolute: 0.7 10*3/uL (ref 0.1–1.0)
Monocytes Relative: 10.4 % (ref 3.0–12.0)
Neutro Abs: 4.1 10*3/uL (ref 1.4–7.7)
RDW: 14.3 % (ref 11.5–14.6)

## 2011-05-07 LAB — BASIC METABOLIC PANEL
BUN: 22 mg/dL (ref 6–23)
CO2: 30 mEq/L (ref 19–32)
Chloride: 104 mEq/L (ref 96–112)
GFR: 55.03 mL/min — ABNORMAL LOW (ref >60.00)
Glucose, Bld: 76 mg/dL (ref 70–99)
Potassium: 3.8 mEq/L (ref 3.5–5.1)
Sodium: 141 mEq/L (ref 135–145)

## 2011-05-07 MED ORDER — CYANOCOBALAMIN 1000 MCG/ML IJ SOLN
1000.0000 ug | Freq: Once | INTRAMUSCULAR | Status: AC
  Administered 2011-05-07: 1000 ug via INTRAMUSCULAR

## 2011-05-07 MED ORDER — FUROSEMIDE 20 MG PO TABS: 20.0000 mg | ORAL_TABLET | Freq: Every day | ORAL | Status: DC

## 2011-05-07 MED ORDER — DILTIAZEM HCL ER COATED BEADS 120 MG PO CP24: 120.0000 mg | ORAL_CAPSULE | Freq: Every day | ORAL | Status: DC

## 2011-05-07 NOTE — Assessment & Plan Note (Signed)
Today her resting atrial fib rate is slow area she says she feels worse after she takes her meds earlier in the day.  Her blood pressure is on the lower side.  I decided to stop her low-dose metoprolol to see her she feels better.

## 2011-05-07 NOTE — Assessment & Plan Note (Signed)
Her volume status is stable.  I do not think it is from her atrial fib rate being too fast.  No further cardiopulmonary workup.

## 2011-05-07 NOTE — Patient Instructions (Signed)
Continue 5 mg daily, recheck 4 weeks 

## 2011-05-07 NOTE — Assessment & Plan Note (Signed)
The patient has lost weight.  Her daughter will be reviewing her calorie intake and see if her calories can be boosted.  They need to be careful with salt intake.

## 2011-05-07 NOTE — Patient Instructions (Signed)
Your physician recommends that you schedule a follow-up appointment in: 8-9 weeks  Your physician recommends that you return for lab work in: today  Your physician has recommended you make the following change in your medication: HOLD Metoprolol

## 2011-05-07 NOTE — Assessment & Plan Note (Signed)
She continues on Coumadin. 

## 2011-05-07 NOTE — Assessment & Plan Note (Signed)
Coronary disease is stable.  I believe she'll be stable off the metoprolol.

## 2011-05-07 NOTE — Assessment & Plan Note (Signed)
Her volume status is stable.  No change in therapy.  We do need to check blood work.

## 2011-05-07 NOTE — Progress Notes (Signed)
HPI Patient is seen today to followup atrial fibrillation.  She also has shortness of breath.  In addition she's been losing weight.  I saw her last February, 2012.  For this visit today I have reviewed all of her old records and I have updated the new electronic medical record complete.  Her shortness of breath is exertional.  She is not having PND or orthopnea.  She is not sensing palpitations.  She says that she feels better in the morning before she takes her medications. Allergies  Allergen Reactions  . Alendronate Sodium     REACTION: GI  . Sulfonamide Derivatives     REACTION: sick    Current Outpatient Prescriptions  Medication Sig Dispense Refill  . acetaminophen (TYLENOL ARTHRITIS PAIN) 650 MG CR tablet Take 650 mg by mouth every 8 (eight) hours as needed.        . Calcium Carbonate-Vitamin D (CALCIUM 600/VITAMIN D) 600-400 MG-UNIT per tablet Take 2 tablets by mouth daily.        . cyanocobalamin (,VITAMIN B-12,) 1000 MCG/ML injection Inject 1,000 mcg into the muscle every 30 (thirty) days.        [name] diltiazem (CARDIZEM CD) 120 MG 24 hr capsule Take 1 capsule (120 mg total) by mouth daily.  30 capsule  6  . ferrous sulfate 325 (65 FE) MG tablet Take 325 mg by mouth daily with breakfast.        . fluticasone (FLONASE) 50 MCG/ACT nasal spray Place 2 sprays into the nose daily.  16 g  1  . furosemide (LASIX) 20 MG tablet Take 1 tablet (20 mg total) by mouth daily.  30 tablet  6  . glucosamine-chondroitin 500-400 MG tablet Take 1 tablet by mouth daily.        [name] levothyroxine (SYNTHROID, LEVOTHROID) 88 MCG tablet Take 88 mcg by mouth daily.        [name] loratadine ([facility] ALL[facility]GY RELIEF) 10 MG tablet Take 10 mg by mouth daily.        . Lutein 6 MG CAPS Take 1 capsule by mouth daily.        . meclizine (ANTIVERT) 25 MG tablet Take 1 tablet (25 mg total) by mouth 3 (three) times daily as needed for dizziness or nausea.  30 tablet  1  . metoprolol (LOPRESSOR) 50 MG tablet Take 0.5 tablets (25 mg  total) by mouth 2 (two) times daily.  90 tablet  3  . Multiple Vitamin (MULTIVITAMIN) tablet Take 1 tablet by mouth daily.        . mupirocin (BACTROBAN) 2 % ointment Apply topically 2 (two) times daily.        . nitroGLYCERIN (NITROSTAT) 0.4 MG SL tablet Place 0.4 mg under the tongue every 5 (five) minutes as needed.        [name] omeprazole (PRILOSEC) 20 MG capsule Take 20 mg by mouth 2 (two) times daily.        [name] Propylene Glycol (SYSTANE BALANCE) 0.6 % SOLN Apply to eye daily.        . simvastatin (ZOCOR) 20 MG tablet Take 1/4 tablet by mouth daily at bedtime.  45 tablet  1  . warfarin (COUMADIN) 5 MG tablet Take 5 mg by mouth as directed.          History   Social History  . Marital Status: Widowed    Spouse Name: N/A    Number of Children: N/A  . Years of Education: N/A   Occupational History  .  Not on file.   Social History Main Topics  . Smoking status: Passive Smoker  . Smokeless tobacco: Not on file  . Alcohol Use: No  . Drug Use: Not on file  . Sexually Active: Not on file   Other Topics Concern  . Not on file   Social History Narrative   Very independent Lives in [place] by herself     Family History  Problem Relation Age of Onset  . Heart disease Mother 29  . Stroke Father 13  . Heart disease Sister   . Cancer Brother     colon  . Diabetes Brother   . Leukemia Sister     Past Medical History  Diagnosis Date  . A-fib     chronic  /  holter 06/2010, no brady, mild tachy  . Tachycardia     holter, 06/2010, no brady, mild  tachy  . CAD (coronary artery disease)     2 DES, [facility], 2004  . Pleural effusion associated with pulmonary infection 03-2007  . Hypothyroid   . Hyperlipidemia   . GERD (gastroesophageal reflux disease)   . Diverticulosis of colon   . Allergy   . Anemia   . Arthritis     osteo  . Hypertension   . COPD (chronic obstructive pulmonary disease)   . Mitral regurgitation     moderate, echo, 06/2010  . Vitamin B deficiency   .  Compression fracture of lumbar vertebra 05-2008  . Osteoporosis   . Thrombocytopenia   . SOB (shortness of breath)   . OA (osteoarthritis)   . LBBB (left bundle branch block)   . Hx of colonoscopy   . Warfarin anticoagulation   . Ejection fraction     50% echo, 2008  /  EF  35-40%, echo, 06/2010  . TR (tricuspid regurgitation)     moderate, echo 2008  . Dyslipidemia   . Lung abnormality     Dr Shelle Iron 122/2011 no further w/u needed  . Prolonged QT interval     when on sotolol in past  . Volume overload     intermittant  . Hypertension   . Weight loss     August, 2012    Past Surgical History  Procedure Date  . Thyroidectomy 1962  . Tonsillectomy     ROS  Patient denies fever, chills, headache, sweats, rash, change in vision, change in hearing, chest pain, cough, nausea vomiting, urinary symptoms.  All other systems are reviewed and are negative other than the history of present illness.  PHYSICAL EXAM Patient is here today with her daughter as always.  She actually looks good.  She is mildly frail.  She is oriented to person time and place.  Affect is normal.  Head is atraumatic.  There is no venous distention.  Lungs are clear.  Respiratory effort is nonlabored.  Her neck exam reveals an S1-S2.  No clicks or significant murmurs.  The abdomen is soft.  The rhythm is irregularly irregular.  There are no musculoskeletal deformities.  There is no skin rashes. Filed Vitals:   05/07/11 0947  BP: 112/60  Pulse: 54  Height: 5\' 9"  (1.753 m)  Weight: 118 lb (53.524 kg)    EKG Is done today and reviewed by me.  There is old left bundle branch block.  There is old atrial fibrillation.  Atrial fibrillation is slow. ASSESSMENT & PLAN

## 2011-05-08 NOTE — Progress Notes (Signed)
  Subjective:    Patient ID: [name], female    DOB: [birth year], 77 y.o.   MRN: [id number]  HPI  Here for injection only  Review of Systems     Objective:   Physical Exam        Assessment & Plan:

## 2011-05-11 ENCOUNTER — Ambulatory Visit: Payer: Medicare Other

## 2011-05-31 ENCOUNTER — Other Ambulatory Visit: Payer: Self-pay | Admitting: *Deleted

## 2011-05-31 MED ORDER — LEVOTHYROXINE SODIUM 88 MCG PO TABS: 88.0000 ug | ORAL_TABLET | Freq: Every day | ORAL | Status: DC

## 2011-06-03 NOTE — Telephone Encounter (Signed)
Quantity on synthroid script changed to 90. Pt has appt next month.  [facility] [address]

## 2011-06-04 ENCOUNTER — Ambulatory Visit: Payer: Medicare Other

## 2011-06-04 ENCOUNTER — Ambulatory Visit (INDEPENDENT_AMBULATORY_CARE_PROVIDER_SITE_OTHER): Payer: Medicare Other | Admitting: Family Medicine

## 2011-06-04 DIAGNOSIS — Z7901 Long term (current) use of anticoagulants: Secondary | ICD-10-CM

## 2011-06-04 DIAGNOSIS — I4891 Unspecified atrial fibrillation: Secondary | ICD-10-CM

## 2011-06-04 DIAGNOSIS — Z5181 Encounter for therapeutic drug level monitoring: Secondary | ICD-10-CM

## 2011-06-04 LAB — POCT INR: INR: 2.1

## 2011-06-11 LAB — POCT CARDIAC MARKERS
CKMB, poc: 1.6
Myoglobin, poc: 97.8

## 2011-06-11 LAB — POCT I-STAT, CHEM 8
Calcium, Ion: 1.15
HCT: 41
Sodium: 144
TCO2: 23

## 2011-06-11 LAB — PROTIME-INR: INR: 2.7 — ABNORMAL HIGH

## 2011-06-11 LAB — APTT: aPTT: 36

## 2011-06-16 LAB — GLUCOSE, CAPILLARY: Glucose-Capillary: 89

## 2011-06-25 LAB — CBC
HCT: 39.4
MCHC: 33.3
MCV: 90.9
RBC: 4.33

## 2011-06-25 LAB — BASIC METABOLIC PANEL
CO2: 30
Chloride: 104
Creatinine, Ser: 0.87
Sodium: 140

## 2011-06-25 LAB — PROTIME-INR: Prothrombin Time: 29.3 — ABNORMAL HIGH

## 2011-06-28 ENCOUNTER — Other Ambulatory Visit: Payer: Self-pay | Admitting: *Deleted

## 2011-06-28 MED ORDER — LEVOTHYROXINE SODIUM 88 MCG PO TABS: 88.0000 ug | ORAL_TABLET | Freq: Every day | ORAL | Status: DC

## 2011-06-29 LAB — BASIC METABOLIC PANEL
BUN: 10
BUN: 12
CO2: 27
CO2: 30
CO2: 31
Calcium: 8.8
Chloride: 102
Chloride: 108
Chloride: 109
Chloride: 99
Creatinine, Ser: 0.94
Creatinine, Ser: 1
GFR calc Af Amer: 49 — ABNORMAL LOW
GFR calc Af Amer: >60
GFR calc Af Amer: >60
GFR calc Af Amer: >60
GFR calc non Af Amer: 41 — ABNORMAL LOW
GFR calc non Af Amer: 57 — ABNORMAL LOW
Potassium: 3.4 — ABNORMAL LOW
Potassium: 3.5
Potassium: 3.9
Potassium: 4
Potassium: 4.2
Sodium: 138
Sodium: 140
Sodium: 142

## 2011-06-29 LAB — CBC
HCT: 29.2 — ABNORMAL LOW
HCT: 29.7 — ABNORMAL LOW
HCT: 32 — ABNORMAL LOW
HCT: 32.7 — ABNORMAL LOW
HCT: 33.3 — ABNORMAL LOW
HCT: 33.7 — ABNORMAL LOW
Hemoglobin: 10.6 — ABNORMAL LOW
Hemoglobin: 11.1 — ABNORMAL LOW
Hemoglobin: 11.1 — ABNORMAL LOW
Hemoglobin: 11.3 — ABNORMAL LOW
Hemoglobin: 9.5 — ABNORMAL LOW
Hemoglobin: 9.8 — ABNORMAL LOW
MCHC: 32.5
MCHC: 33
MCHC: 33.1
MCHC: 33.1
MCHC: 33.3
MCHC: 33.3
MCV: 90.4
MCV: 90.5
MCV: 91.5
MCV: 91.9
Platelets: 299
Platelets: 326
Platelets: 327
Platelets: 355
RBC: 3.15 — ABNORMAL LOW
RBC: 3.18 — ABNORMAL LOW
RBC: 3.28 — ABNORMAL LOW
RBC: 3.45 — ABNORMAL LOW
RBC: 3.5 — ABNORMAL LOW
RBC: 3.58 — ABNORMAL LOW
RBC: 3.66 — ABNORMAL LOW
RBC: 3.72 — ABNORMAL LOW
RBC: 3.77 — ABNORMAL LOW
WBC: 10.4
WBC: 8.1
WBC: 8.4
WBC: 8.7
WBC: 9
WBC: 9.4

## 2011-06-29 LAB — URINALYSIS, ROUTINE W REFLEX MICROSCOPIC
Glucose, UA: NEGATIVE
Glucose, UA: NEGATIVE
Ketones, ur: NEGATIVE
Nitrite: NEGATIVE
Protein, ur: 30 — AB
Specific Gravity, Urine: 1.011
Urobilinogen, UA: 0.2
pH: 6

## 2011-06-29 LAB — PROTIME-INR
INR: 1.5
INR: 1.6 — ABNORMAL HIGH
INR: 1.9 — ABNORMAL HIGH
INR: 2.2 — ABNORMAL HIGH
Prothrombin Time: 21.8 — ABNORMAL HIGH
Prothrombin Time: 23.5 — ABNORMAL HIGH
Prothrombin Time: 25.2 — ABNORMAL HIGH
Prothrombin Time: 26.9 — ABNORMAL HIGH

## 2011-06-29 LAB — URINE MICROSCOPIC-ADD ON

## 2011-06-29 LAB — COMPREHENSIVE METABOLIC PANEL
BUN: 12
CO2: 19
Calcium: 7.1 — ABNORMAL LOW
Chloride: 104
Creatinine, Ser: 0.81
GFR calc non Af Amer: >60
Glucose, Bld: 104 — ABNORMAL HIGH
Total Bilirubin: 0.6

## 2011-06-29 LAB — RETICULOCYTES: Retic Count, Absolute: 62.7

## 2011-06-29 LAB — IRON AND TIBC
Iron: 81
Saturation Ratios: 32
TIBC: 253

## 2011-06-29 LAB — TSH: TSH: 8.172 — ABNORMAL HIGH

## 2011-06-29 LAB — OCCULT BLOOD X 1 CARD TO LAB, STOOL: Fecal Occult Bld: NEGATIVE

## 2011-06-29 LAB — B-NATRIURETIC PEPTIDE (CONVERTED LAB): Pro B Natriuretic peptide (BNP): 1066 — ABNORMAL HIGH

## 2011-06-30 LAB — COMPREHENSIVE METABOLIC PANEL
ALT: 50 — ABNORMAL HIGH
ALT: 61 — ABNORMAL HIGH
AST: 49 — ABNORMAL HIGH
AST: 57 — ABNORMAL HIGH
Albumin: 2.3 — ABNORMAL LOW
Albumin: 2.6 — ABNORMAL LOW
CO2: 26
Calcium: 7.6 — ABNORMAL LOW
Chloride: 109
Creatinine, Ser: 0.98
GFR calc Af Amer: 54 — ABNORMAL LOW
GFR calc Af Amer: >60
GFR calc non Af Amer: 45 — ABNORMAL LOW
Sodium: 136
Sodium: 141
Total Bilirubin: 0.8
Total Protein: 5.3 — ABNORMAL LOW

## 2011-06-30 LAB — PROTIME-INR
INR: 2.4 — ABNORMAL HIGH
INR: 3.4 — ABNORMAL HIGH
Prothrombin Time: 27.2 — ABNORMAL HIGH
Prothrombin Time: 34.8 — ABNORMAL HIGH
Prothrombin Time: 36.5 — ABNORMAL HIGH

## 2011-06-30 LAB — I-STAT 8, (EC8 V) (CONVERTED LAB)
Acid-Base Excess: 1
Hemoglobin: 10.5 — ABNORMAL LOW
Operator id: 234501
Potassium: 4.4
Sodium: 138
TCO2: 25

## 2011-06-30 LAB — CARDIAC PANEL(CRET KIN+CKTOT+MB+TROPI)
CK, MB: 1.1
CK, MB: 1.1
CK, MB: 1.4
Relative Index: INVALID
Relative Index: INVALID
Relative Index: INVALID
Relative Index: INVALID
Relative Index: INVALID
Total CK: 16
Total CK: 23
Total CK: 28
Troponin I: 0.06
Troponin I: 0.07 — ABNORMAL HIGH
Troponin I: 0.08 — ABNORMAL HIGH
Troponin I: 0.12 — ABNORMAL HIGH

## 2011-06-30 LAB — URINE MICROSCOPIC-ADD ON

## 2011-06-30 LAB — D-DIMER, QUANTITATIVE: D-Dimer, Quant: 1.36 — ABNORMAL HIGH

## 2011-06-30 LAB — URINALYSIS, ROUTINE W REFLEX MICROSCOPIC
Glucose, UA: NEGATIVE
Hgb urine dipstick: NEGATIVE
Ketones, ur: 15 — AB
Protein, ur: 30 — AB

## 2011-06-30 LAB — CULTURE, BLOOD (ROUTINE X 2)

## 2011-06-30 LAB — CBC
HCT: 29.9 — ABNORMAL LOW
MCHC: 32.9
MCV: 90.7
Platelets: 181
Platelets: 200
RBC: 3.19 — ABNORMAL LOW
WBC: 8.2

## 2011-06-30 LAB — DIFFERENTIAL
Lymphocytes Relative: 14
Lymphs Abs: 1.1
Neutrophils Relative %: 73

## 2011-06-30 LAB — URINE CULTURE: Colony Count: 40000

## 2011-06-30 LAB — CK TOTAL AND CKMB (NOT AT ARMC): CK, MB: 1.3

## 2011-07-06 ENCOUNTER — Encounter: Payer: Self-pay | Admitting: Cardiology

## 2011-07-06 ENCOUNTER — Ambulatory Visit (INDEPENDENT_AMBULATORY_CARE_PROVIDER_SITE_OTHER): Payer: Medicare Other | Admitting: Cardiology

## 2011-07-06 DIAGNOSIS — I4891 Unspecified atrial fibrillation: Secondary | ICD-10-CM

## 2011-07-06 DIAGNOSIS — I2510 Atherosclerotic heart disease of native coronary artery without angina pectoris: Secondary | ICD-10-CM

## 2011-07-06 DIAGNOSIS — R0602 Shortness of breath: Secondary | ICD-10-CM

## 2011-07-06 NOTE — Assessment & Plan Note (Signed)
Atrial fib as well.  She is feeling better off Toprol.  I will keep her off of this drug.  No further workup.

## 2011-07-06 NOTE — Assessment & Plan Note (Signed)
She has not had any significant shortness of breath.  No change in therapy.

## 2011-07-06 NOTE — Progress Notes (Signed)
HPI Patient is seen for followup of atrial fibrillation.  She is doing very well.  She continues to be active.  We stopped her metoprolol at the time of the last visit.  This was a good decision that she feels better.  She's not having any significant tachycardia palpitations.  She is not having any chest pain.  Her weight has stabilized.  She is getting some supplementary calories.   Allergies  Allergen Reactions  . Alendronate Sodium     REACTION: GI  . Sulfonamide Derivatives     REACTION: sick    Current Outpatient Prescriptions  Medication Sig Dispense Refill  . acetaminophen (TYLENOL ARTHRITIS PAIN) 650 MG CR tablet Take 650 mg by mouth every 8 (eight) hours as needed.        . Calcium Carbonate-Vitamin D (CALCIUM 600/VITAMIN D) 600-400 MG-UNIT per tablet Take 2 tablets by mouth daily.        . cyanocobalamin (,VITAMIN B-12,) 1000 MCG/ML injection Inject 1,000 mcg into the muscle every 30 (thirty) days.        [name] diltiazem (CARDIZEM CD) 120 MG 24 hr capsule Take 1 capsule (120 mg total) by mouth daily.  90 capsule  4  . ferrous sulfate 325 (65 FE) MG tablet Take 325 mg by mouth daily with breakfast.        . fluticasone (FLONASE) 50 MCG/ACT nasal spray Place 2 sprays into the nose daily.  16 g  1  . furosemide (LASIX) 20 MG tablet Take 1 tablet (20 mg total) by mouth daily.  90 tablet  4  . glucosamine-chondroitin 500-400 MG tablet Take 1 tablet by mouth daily.        [name] levothyroxine (SYNTHROID, LEVOTHROID) 88 MCG tablet Take 1 tablet (88 mcg total) by mouth daily.  90 tablet  0  . loratadine ([employer] ALLERGY RELIEF) 10 MG tablet Take 10 mg by mouth daily.        . Lutein 6 MG CAPS Take 1 capsule by mouth daily.        . meclizine (ANTIVERT) 25 MG tablet Take 1 tablet (25 mg total) by mouth 3 (three) times daily as needed for dizziness or nausea.  30 tablet  1  . Multiple Vitamin (MULTIVITAMIN) tablet Take 1 tablet by mouth daily.        . mupirocin (BACTROBAN) 2 % ointment Apply  topically 2 (two) times daily.        . nitroGLYCERIN (NITROSTAT) 0.4 MG SL tablet Place 0.4 mg under the tongue every 5 (five) minutes as needed.        [name] omeprazole (PRILOSEC) 20 MG capsule Take 20 mg by mouth 2 (two) times daily.        [name] Propylene Glycol (SYSTANE BALANCE) 0.6 % SOLN Apply to eye daily.        . simvastatin (ZOCOR) 20 MG tablet Take 1/4 tablet by mouth daily at bedtime.  45 tablet  1  . warfarin (COUMADIN) 5 MG tablet Take 5 mg by mouth as directed.        . metoprolol (LOPRESSOR) 50 MG tablet Take 0.5 tablets (25 mg total) by mouth 2 (two) times daily.  90 tablet  3    History   Social History  . Marital Status: Widowed    Spouse Name: N/A    Number of Children: N/A  . Years of Education: N/A   Occupational History  . Not on file.   Social History Main Topics  . Smoking status:  Passive Smoker  . Smokeless tobacco: Not on file  . Alcohol Use: No  . Drug Use: Not on file  . Sexually Active: Not on file   Other Topics Concern  . Not on file   Social History Narrative   Very independent Lives in [place] by herself     Family History  Problem Relation Age of Onset  . Heart disease Mother 72  . Stroke Father 45  . Heart disease Sister   . Cancer Brother     colon  . Diabetes Brother   . Leukemia Sister     Past Medical History  Diagnosis Date  . A-fib     chronic  /  holter 06/2010, no brady, mild tachy  . Tachycardia     holter, 06/2010, no brady, mild  tachy  . CAD (coronary artery disease)     2 DES, [facility], 2004  . Pleural effusion associated with pulmonary infection 03-2007  . Hypothyroid   . Hyperlipidemia   . GERD (gastroesophageal reflux disease)   . Diverticulosis of colon   . Allergy   . Anemia   . Arthritis     osteo  . Hypertension   . COPD (chronic obstructive pulmonary disease)   . Mitral regurgitation     moderate, echo, 06/2010  . Vitamin B deficiency   . Compression fracture of lumbar vertebra 05-2008  . Osteoporosis   .  Thrombocytopenia   . SOB (shortness of breath)   . OA (osteoarthritis)   . LBBB (left bundle branch block)   . Hx of colonoscopy   . Warfarin anticoagulation   . Ejection fraction     50% echo, 2008  /  EF  35-40%, echo, 06/2010  . TR (tricuspid regurgitation)     moderate, echo 2008  . Dyslipidemia   . Lung abnormality     Dr Shelle Iron 122/2011 no further w/u needed  . Prolonged QT interval     when on sotolol in past  . Volume overload     intermittant  . Hypertension   . Weight loss     August, 2012    Past Surgical History  Procedure Date  . Thyroidectomy 1962  . Tonsillectomy     ROS  Patient denies fever, chills, headache, sweats, rash, , change in hearing, chest pain, cough, nausea vomiting, urinary symptoms. She says she may be having some slight double vision.  She will be following up with her primary physician and ophthalmology.  Also she continues to have some difficulty with her nails.  She will check with her primary physician concerning this.  All other systems are reviewed and are negative. PHYSICAL EXAM Patient is here with her daughter.  She is oriented to person time and place.  Affect is normal.  Head is atraumatic.  There is no xanthelasma.  Lungs are clear.  Respiratory effort is not labored.  Cardiac exam reveals S1-S2.  The rhythm is irregularly irregular.  The abdomen is soft.  There is no peripheral edema. Filed Vitals:   07/06/11 1009  BP: 108/62  Pulse: 75  Height: 5\' 9"  (1.753 m)  Weight: 120 lb (54.432 kg)    EKG is not done today.  ASSESSMENT & PLAN

## 2011-07-06 NOTE — Assessment & Plan Note (Signed)
Coronary disease is stable.  No change in therapy.  Nausea back in 6 months.

## 2011-07-06 NOTE — Patient Instructions (Signed)
Your physician recommends that you continue on your current medications as directed. Please refer to the Current Medication list given to you today.  Your physician wants you to follow-up in: 6 months. You will receive a reminder letter in the mail two months in advance. If you don't receive a letter, please call our office to schedule the follow-up appointment.  

## 2011-07-09 ENCOUNTER — Ambulatory Visit (INDEPENDENT_AMBULATORY_CARE_PROVIDER_SITE_OTHER): Payer: Medicare Other | Admitting: Family Medicine

## 2011-07-09 ENCOUNTER — Other Ambulatory Visit: Payer: Self-pay | Admitting: *Deleted

## 2011-07-09 ENCOUNTER — Encounter: Payer: Self-pay | Admitting: Family Medicine

## 2011-07-09 VITALS — BP 124/64 | HR 84 | Temp 97.5°F | Ht 66.0 in | Wt 120.5 lb

## 2011-07-09 DIAGNOSIS — M810 Age-related osteoporosis without current pathological fracture: Secondary | ICD-10-CM

## 2011-07-09 DIAGNOSIS — Z7901 Long term (current) use of anticoagulants: Secondary | ICD-10-CM

## 2011-07-09 DIAGNOSIS — E538 Deficiency of other specified B group vitamins: Secondary | ICD-10-CM

## 2011-07-09 DIAGNOSIS — H538 Other visual disturbances: Secondary | ICD-10-CM | POA: Insufficient documentation

## 2011-07-09 DIAGNOSIS — H9319 Tinnitus, unspecified ear: Secondary | ICD-10-CM

## 2011-07-09 DIAGNOSIS — Z5181 Encounter for therapeutic drug level monitoring: Secondary | ICD-10-CM

## 2011-07-09 DIAGNOSIS — D518 Other vitamin B12 deficiency anemias: Secondary | ICD-10-CM

## 2011-07-09 DIAGNOSIS — E039 Hypothyroidism, unspecified: Secondary | ICD-10-CM

## 2011-07-09 DIAGNOSIS — Z23 Encounter for immunization: Secondary | ICD-10-CM

## 2011-07-09 DIAGNOSIS — I10 Essential (primary) hypertension: Secondary | ICD-10-CM

## 2011-07-09 DIAGNOSIS — H9190 Unspecified hearing loss, unspecified ear: Secondary | ICD-10-CM | POA: Insufficient documentation

## 2011-07-09 DIAGNOSIS — E785 Hyperlipidemia, unspecified: Secondary | ICD-10-CM | POA: Insufficient documentation

## 2011-07-09 LAB — CBC WITH DIFFERENTIAL/PLATELET
Basophils Absolute: 0 10*3/uL (ref 0.0–0.1)
Eosinophils Absolute: 0 10*3/uL (ref 0.0–0.7)
Hemoglobin: 13 g/dL (ref 12.0–15.0)
Lymphocytes Relative: 20.5 % (ref 12.0–46.0)
MCHC: 33.4 g/dL (ref 30.0–36.0)
Neutro Abs: 3.7 10*3/uL (ref 1.4–7.7)
Platelets: 95 10*3/uL — ABNORMAL LOW (ref 150.0–400.0)
RDW: 14 % (ref 11.5–14.6)

## 2011-07-09 LAB — LIPID PANEL
Cholesterol: 145 mg/dL (ref 0–200)
LDL Cholesterol: 61 mg/dL (ref 0–99)
Total CHOL/HDL Ratio: 2
Triglycerides: 56 mg/dL (ref 0.0–149.0)
VLDL: 11.2 mg/dL (ref 0.0–40.0)

## 2011-07-09 LAB — COMPREHENSIVE METABOLIC PANEL
ALT: 15 U/L (ref 0–35)
AST: 24 U/L (ref 0–37)
Calcium: 8.9 mg/dL (ref 8.4–10.5)
Chloride: 105 mEq/L (ref 96–112)
Creatinine, Ser: 0.8 mg/dL (ref 0.4–1.2)
Sodium: 143 mEq/L (ref 135–145)
Total Bilirubin: 1.1 mg/dL (ref 0.3–1.2)
Total Protein: 6.6 g/dL (ref 6.0–8.3)

## 2011-07-09 MED ORDER — CYANOCOBALAMIN 1000 MCG/ML IJ SOLN
1000.0000 ug | Freq: Once | INTRAMUSCULAR | Status: AC
  Administered 2011-07-09: 1000 ug via INTRAMUSCULAR

## 2011-07-09 NOTE — Progress Notes (Signed)
Subjective:    Patient ID: [name], female    DOB: 10-29-1921, 77 y.o.   MRN: [id number]  HPI Here for annual check up of chronic medical problems and to review health mt list  Also c/o vision blurriness and hearing problem  Just had a met removed - metoprolol- and feels better without it   Vision- has noticed blurriness  Here 20/40 both eyes  They will make her eye appt - with opthymologist   Hearing -- is worse than it used to be  audioscope at 40 db-- shows high frequency loss today This does not bother her or her family   HTN in good control 124/64 No cp or ha or edema   Heart issues- still in afib- doing ok - does not go back for 6 mo   Wt is stable with bmi of 19- no further loss Is drinking some boost and at home is gaining some wt   Hypothyroid Lab Results  Component Value Date   TSH 0.62 06/18/2010    No changes in dose Needs this checked - has not been on new dose since 2004 - needs that check    Lipids have been well controlled on lipitor - due for a check No med side eff   Flu shot- got that today Also B12 Td 09 utd pneumovax and zostavax  Mam 11/06- not interested in mammograms  Self exam -no lumps Declines breast exams   OP - has lost height  Ca and D dexa was 8/10 - with low T score in hip   Patient Active Problem List  Diagnoses  . HYPOTHYROIDISM  . GOUT, UNSPECIFIED  . ANEMIA, B12 DEFICIENCY  . THROMBOCYTOPENIA  . VERTIGO, BENIGN PAROXYSMAL POSITION  . TINNITUS NOS  . HYPERTENSION  . VENOUS INSUFFICIENCY  . ALLERGIC RHINITIS  . Other Diseases of Lung, not Elsewhere Classified  . GERD  . OSTEOPOROSIS NOS  . HOARSENESS  . MEDIASTINAL LYMPHADENOPATHY  . SHORTNESS OF BREATH  . FREQUENCY, URINARY  . Nonspecific (abnormal) findings on radiological and other examination of body structure  . HYPOXEMIA  . ABNORMAL CHEST XRAY  . Coccydynia  . Ejection fraction  . A-fib  . Tachycardia  . CAD (coronary artery disease)  . Mitral  regurgitation  . SOB (shortness of breath)  . LBBB (left bundle branch block)  . Warfarin anticoagulation  . Ejection fraction  . TR (tricuspid regurgitation)  . Dyslipidemia  . Lung abnormality  . Prolonged QT interval  . Volume overload  . Weight loss  . Hyperlipidemia  . Vision blurred  . Hearing loss   Past Medical History  Diagnosis Date  . A-fib     chronic  /  holter 06/2010, no brady, mild tachy  . Tachycardia     holter, 06/2010, no brady, mild  tachy  . CAD (coronary artery disease)     2 DES, [facility], 2004  . Pleural effusion associated with pulmonary infection 03-2007  . Hypothyroid   . Hyperlipidemia   . GERD (gastroesophageal reflux disease)   . Diverticulosis of colon   . Allergy   . Anemia   . Arthritis     osteo  . Hypertension   . COPD (chronic obstructive pulmonary disease)   . Mitral regurgitation     moderate, echo, 06/2010  . Vitamin B deficiency   . Compression fracture of lumbar vertebra 05-2008  . Osteoporosis   . Thrombocytopenia   . SOB (shortness of breath)   .  OA (osteoarthritis)   . LBBB (left bundle branch block)   . Hx of colonoscopy   . Warfarin anticoagulation   . Ejection fraction     50% echo, 2008  /  EF  35-40%, echo, 06/2010  . TR (tricuspid regurgitation)     moderate, echo 2008  . Dyslipidemia   . Lung abnormality     Dr Shelle Iron 122/2011 no further w/u needed  . Prolonged QT interval     when on sotolol in past  . Volume overload     intermittant  . Hypertension   . Weight loss     August, 2012   Past Surgical History  Procedure Date  . Thyroidectomy 1962  . Tonsillectomy    History  Substance Use Topics  . Smoking status: Former Smoker    Quit date: 09/14/1947  . Smokeless tobacco: Not on file  . Alcohol Use: No   Family History  Problem Relation Age of Onset  . Heart disease Mother 28  . Stroke Father 12  . Heart disease Sister   . Cancer Brother     colon  . Diabetes Brother   . Leukemia Sister     Allergies  Allergen Reactions  . Alendronate Sodium     REACTION: GI  . Sulfonamide Derivatives     REACTION: sick   Current Outpatient Prescriptions on File Prior to Visit  Medication Sig Dispense Refill  . Calcium Carbonate-Vitamin D (CALCIUM 600/VITAMIN D) 600-400 MG-UNIT per tablet Take 2 tablets by mouth daily.        . cyanocobalamin (,VITAMIN B-12,) 1000 MCG/ML injection Inject 1,000 mcg into the muscle every 30 (thirty) days.        [name] diltiazem (CARDIZEM CD) 120 MG 24 hr capsule Take 1 capsule (120 mg total) by mouth daily.  90 capsule  4  . ferrous sulfate 325 (65 FE) MG tablet Take 325 mg by mouth daily with breakfast.        . fluticasone (FLONASE) 50 MCG/ACT nasal spray Place 2 sprays into the nose daily.  16 g  1  . furosemide (LASIX) 20 MG tablet Take 1 tablet (20 mg total) by mouth daily.  90 tablet  4  . glucosamine-chondroitin 500-400 MG tablet Take 1 tablet by mouth daily.        [name] levothyroxine (SYNTHROID, LEVOTHROID) 88 MCG tablet Take 1 tablet (88 mcg total) by mouth daily.  90 tablet  0  . Lutein 6 MG CAPS Take 1 capsule by mouth daily.        . Multiple Vitamin (MULTIVITAMIN) tablet Take 1 tablet by mouth daily.        [name] omeprazole (PRILOSEC) 20 MG capsule Take 20 mg by mouth 2 (two) times daily.        [name] Propylene Glycol (SYSTANE BALANCE) 0.6 % SOLN Apply to eye daily.        . simvastatin (ZOCOR) 20 MG tablet Take 1/4 tablet by mouth daily at bedtime.  45 tablet  1  . warfarin (COUMADIN) 5 MG tablet Take 5 mg by mouth as directed.        [name] acetaminophen (TYLENOL ARTHRITIS PAIN) 650 MG CR tablet Take 650 mg by mouth every 8 (eight) hours as needed.        . loratadine ([employer] ALLERGY RELIEF) 10 MG tablet Take 10 mg by mouth daily.        . meclizine (ANTIVERT) 25 MG tablet Take 1 tablet (25 mg total) by mouth  3 (three) times daily as needed for dizziness or nausea.  30 tablet  1  . metoprolol (LOPRESSOR) 50 MG tablet Take 0.5 tablets (25 mg total) by mouth 2 (two)  times daily.  90 tablet  3  . mupirocin (BACTROBAN) 2 % ointment Apply topically 2 (two) times daily.        . nitroGLYCERIN (NITROSTAT) 0.4 MG SL tablet Place 0.4 mg under the tongue every 5 (five) minutes as needed.               Review of Systems Review of Systems  Constitutional: Negative for fever, appetite change, fatigue and unexpected weight change.  Eyes: Negative for pain and pos for decreased vision  ENT pos for decreased hearing , no ear pain or sore throat  Respiratory: Negative for cough and shortness of breath.   Cardiovascular: Negative for cp or palpitations    Gastrointestinal: Negative for nausea, diarrhea and constipation.  Genitourinary: Negative for urgency and frequency.  Skin: Negative for pallor or rash   MSK has some chronic back and hip pain (pressure points when she sleeps) Neurological: Negative for weakness, light-headedness, numbness and headaches.  Hematological: Negative for adenopathy. Does not bruise/bleed easily.  Psychiatric/Behavioral: Negative for dysphoric mood. The patient is not nervous/anxious.          Objective:   Physical Exam  Constitutional: She appears well-developed and well-nourished. No distress.       Well appearing slim elderly female  HENT:  Head: Normocephalic and atraumatic.  Right Ear: External ear normal.  Left Ear: External ear normal.  Nose: Nose normal.  Mouth/Throat: Oropharynx is clear and moist.  Eyes: Conjunctivae and EOM are normal. Pupils are equal, round, and reactive to light.  Neck: Normal range of motion. Neck supple. No JVD present. Carotid bruit is not present. No thyromegaly present.  Cardiovascular: Normal rate, regular rhythm, normal heart sounds and intact distal pulses.  Exam reveals no gallop.   Pulmonary/Chest: Effort normal and breath sounds normal. No respiratory distress. She has no wheezes. She has no rales.  Abdominal: Soft. Bowel sounds are normal. She exhibits no distension, no abdominal  bruit and no mass. There is no tenderness.  Musculoskeletal: Normal range of motion. She exhibits no edema.       Pt has tender pressure points- coccyx and hips - no ulceration seen   Lymphadenopathy:    She has no cervical adenopathy.  Neurological: She is alert. She has normal reflexes. She exhibits normal muscle tone. Coordination normal.  Skin: Skin is warm and dry. No rash noted. No erythema. No pallor.  Psychiatric: She has a normal mood and affect.          Assessment & Plan:

## 2011-07-09 NOTE — Patient Instructions (Signed)
I'm glad you are doing well  Labs today  We will schedule dexa (bone density test ) at check out  Keep eating for weight gain Think about an egg crate mattress cover for pressure points

## 2011-07-09 NOTE — Patient Instructions (Signed)
Continue 5 mg daily, recheck 4 weeks 

## 2011-07-11 NOTE — Assessment & Plan Note (Signed)
This is controlled with current meds and also followed by cardiol No longer on beta blocker

## 2011-07-11 NOTE — Assessment & Plan Note (Signed)
Recommend f/u with opthy for blurred vision - ? If needs new glasses or has cataracts

## 2011-07-11 NOTE — Assessment & Plan Note (Addendum)
Will order dexa  Pt may consider bone building tx if numbers have worsened Disc ca and D and risks of fx  Check D level today

## 2011-07-11 NOTE — Assessment & Plan Note (Signed)
Suspect age related,, higher frequencies with some tinnitus at times  Not bothering her enough to do anything at this time

## 2011-07-11 NOTE — Assessment & Plan Note (Signed)
Has been clinically stable  Re check tsh today on current dose

## 2011-07-11 NOTE — Assessment & Plan Note (Signed)
Controlled with zocor and diet  Rev last labs - due for lab today Rev low sat fat diet

## 2011-07-29 ENCOUNTER — Telehealth: Payer: Self-pay | Admitting: *Deleted

## 2011-07-29 NOTE — Telephone Encounter (Signed)
Pt is out of town and has a cough with chest congestion.  Daughter is asking if ok for her to take mucinex DM.  Advised her ok to use generic mucous relief expectorant, without the D, drink lots of fluids with this.  She is asking if ok for her to use otc cough meds, advised robitussin or delsym, but nothing with a D.

## 2011-07-29 NOTE — Telephone Encounter (Signed)
I agree with that , thanks - if much worse while out of town seek care in an urgent care

## 2011-08-03 ENCOUNTER — Other Ambulatory Visit: Payer: Self-pay | Admitting: Family Medicine

## 2011-08-03 ENCOUNTER — Ambulatory Visit (INDEPENDENT_AMBULATORY_CARE_PROVIDER_SITE_OTHER)
Admission: RE | Admit: 2011-08-03 | Discharge: 2011-08-03 | Disposition: A | Discharge status: Home / Self Care | Payer: Medicare Other | Source: Ambulatory Visit

## 2011-08-03 DIAGNOSIS — M810 Age-related osteoporosis without current pathological fracture: Secondary | ICD-10-CM

## 2011-08-03 DIAGNOSIS — D696 Thrombocytopenia, unspecified: Secondary | ICD-10-CM

## 2011-08-04 ENCOUNTER — Other Ambulatory Visit (INDEPENDENT_AMBULATORY_CARE_PROVIDER_SITE_OTHER): Payer: Medicare Other

## 2011-08-04 ENCOUNTER — Ambulatory Visit (INDEPENDENT_AMBULATORY_CARE_PROVIDER_SITE_OTHER): Payer: Medicare Other | Admitting: Family Medicine

## 2011-08-04 ENCOUNTER — Other Ambulatory Visit: Payer: Self-pay | Admitting: *Deleted

## 2011-08-04 DIAGNOSIS — Z5181 Encounter for therapeutic drug level monitoring: Secondary | ICD-10-CM

## 2011-08-04 DIAGNOSIS — Z7901 Long term (current) use of anticoagulants: Secondary | ICD-10-CM

## 2011-08-04 DIAGNOSIS — D696 Thrombocytopenia, unspecified: Secondary | ICD-10-CM

## 2011-08-04 DIAGNOSIS — I4891 Unspecified atrial fibrillation: Secondary | ICD-10-CM

## 2011-08-04 LAB — CBC WITH DIFFERENTIAL/PLATELET
Basophils Relative: 0.4 % (ref 0.0–3.0)
Eosinophils Absolute: 0.1 10*3/uL (ref 0.0–0.7)
Eosinophils Relative: 0.9 % (ref 0.0–5.0)
Lymphocytes Relative: 17.8 % (ref 12.0–46.0)
Monocytes Absolute: 0.6 10*3/uL (ref 0.1–1.0)
Neutrophils Relative %: 70.8 % (ref 43.0–77.0)
Platelets: 113 10*3/uL — ABNORMAL LOW (ref 150.0–400.0)
RBC: 4.09 Mil/uL (ref 3.87–5.11)
WBC: 5.7 10*3/uL (ref 4.5–10.5)

## 2011-08-04 LAB — HM DEXA SCAN

## 2011-08-04 MED ORDER — FERROUS SULFATE 325 (65 FE) MG PO TABS: 325.0000 mg | ORAL_TABLET | Freq: Every day | ORAL | Status: DC

## 2011-08-04 MED ORDER — OMEPRAZOLE 20 MG PO CPDR: DELAYED_RELEASE_CAPSULE | ORAL | Status: DC

## 2011-08-04 MED ORDER — WARFARIN SODIUM 5 MG PO TABS: ORAL_TABLET | ORAL | Status: DC

## 2011-08-04 NOTE — Patient Instructions (Signed)
Continue current dose, check in 4 weeks  

## 2011-08-09 ENCOUNTER — Ambulatory Visit: Payer: Medicare Other

## 2011-08-09 ENCOUNTER — Other Ambulatory Visit: Payer: Medicare Other

## 2011-08-10 ENCOUNTER — Other Ambulatory Visit: Payer: Medicare Other

## 2011-09-08 ENCOUNTER — Ambulatory Visit (INDEPENDENT_AMBULATORY_CARE_PROVIDER_SITE_OTHER): Payer: Medicare Other | Admitting: Family Medicine

## 2011-09-08 DIAGNOSIS — Z5181 Encounter for therapeutic drug level monitoring: Secondary | ICD-10-CM

## 2011-09-08 DIAGNOSIS — I4891 Unspecified atrial fibrillation: Secondary | ICD-10-CM

## 2011-09-08 DIAGNOSIS — Z7901 Long term (current) use of anticoagulants: Secondary | ICD-10-CM

## 2011-09-08 LAB — POCT INR: INR: 1.9

## 2011-09-08 NOTE — Patient Instructions (Signed)
Continue 5 mg daily, recheck 4 weeks 

## 2011-09-09 ENCOUNTER — Encounter: Payer: Self-pay | Admitting: Family Medicine

## 2011-10-06 ENCOUNTER — Encounter: Payer: Self-pay | Admitting: Family Medicine

## 2011-10-06 ENCOUNTER — Ambulatory Visit (INDEPENDENT_AMBULATORY_CARE_PROVIDER_SITE_OTHER): Payer: Medicare Other | Admitting: Family Medicine

## 2011-10-06 ENCOUNTER — Ambulatory Visit (INDEPENDENT_AMBULATORY_CARE_PROVIDER_SITE_OTHER)
Admission: RE | Admit: 2011-10-06 | Discharge: 2011-10-06 | Disposition: A | Discharge status: Home / Self Care | Payer: Medicare Other | Source: Ambulatory Visit | Attending: Family Medicine | Admitting: Family Medicine

## 2011-10-06 ENCOUNTER — Telehealth: Payer: Self-pay | Admitting: Family Medicine

## 2011-10-06 VITALS — BP 118/68 | HR 92 | Temp 97.3°F | Ht 66.0 in | Wt 121.2 lb

## 2011-10-06 DIAGNOSIS — M25519 Pain in unspecified shoulder: Secondary | ICD-10-CM

## 2011-10-06 DIAGNOSIS — M25512 Pain in left shoulder: Secondary | ICD-10-CM

## 2011-10-06 DIAGNOSIS — M545 Low back pain: Secondary | ICD-10-CM

## 2011-10-06 DIAGNOSIS — Z7901 Long term (current) use of anticoagulants: Secondary | ICD-10-CM

## 2011-10-06 DIAGNOSIS — I4891 Unspecified atrial fibrillation: Secondary | ICD-10-CM

## 2011-10-06 DIAGNOSIS — Z5181 Encounter for therapeutic drug level monitoring: Secondary | ICD-10-CM

## 2011-10-06 LAB — POCT INR: INR: 2.6

## 2011-10-06 MED ORDER — CYCLOBENZAPRINE HCL 10 MG PO TABS: ORAL_TABLET | ORAL | Status: DC

## 2011-10-06 NOTE — Patient Instructions (Signed)
Continue current dose, check in 4 weeks  

## 2011-10-06 NOTE — Assessment & Plan Note (Signed)
I think pt may have strained her bicep tendon with her exercise machine  However still some impingement signs- so will check xray  Heat is ok 10 min at a time and will update She is staying with family right now
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
## 2011-10-06 NOTE — Progress Notes (Signed)
Subjective:    Patient ID: [name], female    DOB: [birth year], 78 y.o.   MRN: [id number]  HPI Here for left shoulder and arm pain  Pain started in her middle back around waistline -- about 10 days ago - on R side -that is the same  Is difficult to sit up from lying position , is lying on the heating pad / hard to get comfortable , sharp if she moves  Hard to sleep at night  Was using exercise equiptment and sit ups and some push ups and uses 5lb  No particular moment of injury  Now has moved up into L arm and shoulder and in neck too  Started some time last week  Noticed it stated hurting - no injury -- did hold off on her exercise  No falls at all  Shoulder pain is in back of shoulder- then radiates into the bicep area   No numbness or tingling  No weakness     dexa 2012 showed osteoporosis of hip/osteopenia of spine  Intol of fosamax  Patient Active Problem List  Diagnoses  . HYPOTHYROIDISM  . GOUT, UNSPECIFIED  . ANEMIA, B12 DEFICIENCY  . THROMBOCYTOPENIA  . VERTIGO, BENIGN PAROXYSMAL POSITION  . TINNITUS NOS  . HYPERTENSION  . VENOUS INSUFFICIENCY  . ALLERGIC RHINITIS  . Other Diseases of Lung, not Elsewhere Classified  . GERD  . OSTEOPOROSIS NOS  . HOARSENESS  . MEDIASTINAL LYMPHADENOPATHY  . SHORTNESS OF BREATH  . FREQUENCY, URINARY  . Nonspecific (abnormal) findings on radiological and other examination of body structure  . HYPOXEMIA  . ABNORMAL CHEST XRAY  . Coccydynia  . Ejection fraction  . A-fib  . Tachycardia  . CAD (coronary artery disease)  . Mitral regurgitation  . SOB (shortness of breath)  . LBBB (left bundle branch block)  . Warfarin anticoagulation  . Ejection fraction  . TR (tricuspid regurgitation)  . Dyslipidemia  . Lung abnormality  . Prolonged QT interval  . Volume overload  . Weight loss  . Hyperlipidemia  . Vision blurred  . Hearing loss  . Left shoulder pain  . Lumbar pain   Past Medical History  Diagnosis Date    . A-fib     chronic  /  holter 06/2010, no brady, mild tachy  . Tachycardia     holter, 06/2010, no brady, mild  tachy  . CAD (coronary artery disease)     2 DES, [facility], 2004  . Pleural effusion associated with pulmonary infection 03-2007  . Hypothyroid   . Hyperlipidemia   . GERD (gastroesophageal reflux disease)   . Diverticulosis of colon   . Allergy   . Anemia   . Arthritis     osteo  . Hypertension   . COPD (chronic obstructive pulmonary disease)   . Mitral regurgitation     moderate, echo, 06/2010  . Vitamin B deficiency   . Compression fracture of lumbar vertebra 05-2008  . Osteoporosis   . Thrombocytopenia   . SOB (shortness of breath)   . OA (osteoarthritis)   . LBBB (left bundle branch block)   . Hx of colonoscopy   . Warfarin anticoagulation   . Ejection fraction     50% echo, 2008  /  EF  35-40%, echo, 06/2010  . TR (tricuspid regurgitation)     moderate, echo 2008  . Dyslipidemia   . Lung abnormality     Dr Shelle Iron 122/2011 no further w/u needed  . Prolonged QT  interval     when on sotolol in past  . Volume overload     intermittant  . Hypertension   . Weight loss     August, 2012   Past Surgical History  Procedure Date  . Thyroidectomy 1962  . Tonsillectomy    History  Substance Use Topics  . Smoking status: Former Smoker    Quit date: 09/14/1947  . Smokeless tobacco: Not on file  . Alcohol Use: No   Family History  Problem Relation Age of Onset  . Heart disease Mother 45  . Stroke Father 1  . Heart disease Sister   . Cancer Brother     colon  . Diabetes Brother   . Leukemia Sister    Allergies  Allergen Reactions  . Alendronate Sodium     REACTION: GI  . Sulfonamide Derivatives     REACTION: sick   Current Outpatient Prescriptions on File Prior to Visit  Medication Sig Dispense Refill  . acetaminophen (TYLENOL ARTHRITIS PAIN) 650 MG CR tablet Take 650 mg by mouth every 8 (eight) hours as needed.        . Calcium  Carbonate-Vitamin D (CALCIUM 600/VITAMIN D) 600-400 MG-UNIT per tablet Take 2 tablets by mouth daily.        . cyanocobalamin (,VITAMIN B-12,) 1000 MCG/ML injection Inject 1,000 mcg into the muscle every 30 (thirty) days.        [name] diltiazem (CARDIZEM CD) 120 MG 24 hr capsule Take 1 capsule (120 mg total) by mouth daily.  90 capsule  4  . ferrous sulfate 325 (65 FE) MG tablet Take 1 tablet (325 mg total) by mouth daily with breakfast.  90 tablet  3  . fluticasone (FLONASE) 50 MCG/ACT nasal spray Place 2 sprays into the nose daily.  16 g  1  . furosemide (LASIX) 20 MG tablet Take 1 tablet (20 mg total) by mouth daily.  90 tablet  4  . glucosamine-chondroitin 500-400 MG tablet Take 1 tablet by mouth daily.        [name] levothyroxine (SYNTHROID, LEVOTHROID) 88 MCG tablet Take 1 tablet (88 mcg total) by mouth daily.  90 tablet  0  . Lutein 6 MG CAPS Take 1 capsule by mouth daily.        . Multiple Vitamin (MULTIVITAMIN) tablet Take 1 tablet by mouth daily.        . mupirocin (BACTROBAN) 2 % ointment Apply topically 2 (two) times daily.        [name] omeprazole (PRILOSEC) 20 MG capsule Take one tablet by mouth twice a day.  180 capsule  3  . Propylene Glycol (SYSTANE BALANCE) 0.6 % SOLN Apply to eye daily.        . simvastatin (ZOCOR) 20 MG tablet Take 1/4 tablet by mouth daily at bedtime.  45 tablet  1  . warfarin (COUMADIN) 5 MG tablet Take one by mouth daily.  90 tablet  3  . loratadine ([employer] ALLERGY RELIEF) 10 MG tablet Take 10 mg by mouth daily.        . meclizine (ANTIVERT) 25 MG tablet Take 1 tablet (25 mg total) by mouth 3 (three) times daily as needed for dizziness or nausea.  30 tablet  1  . metoprolol (LOPRESSOR) 50 MG tablet Take 0.5 tablets (25 mg total) by mouth 2 (two) times daily.  90 tablet  3  . nitroGLYCERIN (NITROSTAT) 0.4 MG SL tablet Place 0.4 mg under the tongue every 5 (five) minutes as needed.  Review of Systems Review of Systems  Constitutional: Negative for fever,  appetite change, fatigue and unexpected weight change.  Eyes: Negative for pain and visual disturbance.  Respiratory: Negative for cough and shortness of breath.   Cardiovascular: Negative for cp or palpitations    Gastrointestinal: Negative for nausea, diarrhea and constipation.  Genitourinary: Negative for urgency and frequency.  Skin: Negative for pallor or rash   MSK neg for joint swelling or redness Neurological: Negative for weakness, light-headedness, numbness and headaches.  Hematological: Negative for adenopathy. Does not bruise/bleed easily.  Psychiatric/Behavioral: Negative for dysphoric mood. The patient is not nervous/anxious.          Objective:   Physical Exam  Constitutional: She appears well-developed and well-nourished. No distress.  HENT:  Head: Normocephalic and atraumatic.  Eyes: Conjunctivae and EOM are normal. Pupils are equal, round, and reactive to light. No scleral icterus.  Neck: Normal range of motion. Neck supple.       No bony tenderness  Cardiovascular: Normal rate, regular rhythm and normal heart sounds.   Pulmonary/Chest: Effort normal and breath sounds normal.  Musculoskeletal: She exhibits tenderness. She exhibits no edema.       Left shoulder: She exhibits decreased range of motion and tenderness. She exhibits no swelling, no effusion, no crepitus, normal pulse and normal strength.       Lumbar back: She exhibits decreased range of motion, tenderness and spasm. She exhibits no bony tenderness and no edema.       L shoulder- limited abduction past 90deg/ with fair internal and ext rotatoin No acromial tenderness Pos for bicep tendon ttp and bicep muscle also  No TS or CS tenderness Pos hawking test  LS - scoliosis noted No bony tenderness Some R buttock tenderness Neg slr Nl hip rot Limited flexion  Neurological: She is alert. She has normal reflexes. No cranial nerve deficit. She exhibits normal muscle tone. Coordination normal.  Skin: Skin  is warm and dry. No erythema. No pallor.          Assessment & Plan:

## 2011-10-06 NOTE — Telephone Encounter (Signed)
I think it is fine to do her INR today after her visit

## 2011-10-06 NOTE — Patient Instructions (Signed)
I think you have a tendon and muscle sprain in your left arm - possibly from new exercise Heat is ok for 10 minutes at a time Xray of that today (shoulder)  The lumbar back pain - may also be from the new exercise  Try flexeril (muscle relaxer)- at night when not alone (can make you sleepy)  You have known scoliosis and old fractures in that part of your back too  Xray today (low back)  We will update you with results when we get them

## 2011-10-06 NOTE — Telephone Encounter (Signed)
Pt had INR done while here today.

## 2011-10-07 ENCOUNTER — Ambulatory Visit: Payer: Medicare Other

## 2011-10-11 ENCOUNTER — Ambulatory Visit (INDEPENDENT_AMBULATORY_CARE_PROVIDER_SITE_OTHER): Payer: Medicare Other | Admitting: Family Medicine

## 2011-10-11 ENCOUNTER — Encounter: Payer: Self-pay | Admitting: Family Medicine

## 2011-10-11 VITALS — BP 124/70 | HR 92 | Temp 97.3°F | Ht 66.0 in | Wt 119.2 lb

## 2011-10-11 DIAGNOSIS — M25512 Pain in left shoulder: Secondary | ICD-10-CM

## 2011-10-11 DIAGNOSIS — D518 Other vitamin B12 deficiency anemias: Secondary | ICD-10-CM

## 2011-10-11 DIAGNOSIS — M545 Low back pain, unspecified: Secondary | ICD-10-CM

## 2011-10-11 DIAGNOSIS — M25519 Pain in unspecified shoulder: Secondary | ICD-10-CM

## 2011-10-11 DIAGNOSIS — E538 Deficiency of other specified B group vitamins: Secondary | ICD-10-CM

## 2011-10-11 DIAGNOSIS — M810 Age-related osteoporosis without current pathological fracture: Secondary | ICD-10-CM

## 2011-10-11 MED ORDER — RALOXIFENE HCL 60 MG PO TABS: 60.0000 mg | ORAL_TABLET | Freq: Every day | ORAL | Status: AC

## 2011-10-11 MED ORDER — CYANOCOBALAMIN 1000 MCG/ML IJ SOLN
1000.0000 ug | Freq: Once | INTRAMUSCULAR | Status: AC
  Administered 2011-10-11: 1000 ug via INTRAMUSCULAR

## 2011-10-11 NOTE — Progress Notes (Signed)
Subjective:    Patient ID: [name], female    DOB: 1922-05-28, 78 y.o.   MRN: [id number]  HPI Here for f/u of shoulder and low back pain  No better at all - in fact feels worse perhaps  Taking flexeril does help some with night time pain- but difficult to change position in bed   Shoulder xray ok  Did have a soft tissue density seen medial to proximal humerous--  ? Skin or LN On exam there is a large protruding SK in that area  Shoulder and arm (bicep area ) still painful No neck pain   Spinal films showed new fx at T12 Known OP  nontol of fosamax dexa ws 11/12-- LS -2.2 and FN -2.9 No known trauma   Note -had a sister that had to have kyphoplasty in past - OP runs in family   Patient Active Problem List  Diagnoses  . HYPOTHYROIDISM  . GOUT, UNSPECIFIED  . ANEMIA, B12 DEFICIENCY  . THROMBOCYTOPENIA  . VERTIGO, BENIGN PAROXYSMAL POSITION  . TINNITUS NOS  . HYPERTENSION  . VENOUS INSUFFICIENCY  . ALLERGIC RHINITIS  . Other Diseases of Lung, not Elsewhere Classified  . GERD  . OSTEOPOROSIS NOS  . HOARSENESS  . MEDIASTINAL LYMPHADENOPATHY  . SHORTNESS OF BREATH  . FREQUENCY, URINARY  . Nonspecific (abnormal) findings on radiological and other examination of body structure  . HYPOXEMIA  . ABNORMAL CHEST XRAY  . Coccydynia  . Ejection fraction  . A-fib  . Tachycardia  . CAD (coronary artery disease)  . Mitral regurgitation  . SOB (shortness of breath)  . LBBB (left bundle branch block)  . Warfarin anticoagulation  . Ejection fraction  . TR (tricuspid regurgitation)  . Dyslipidemia  . Lung abnormality  . Prolonged QT interval  . Volume overload  . Weight loss  . Hyperlipidemia  . Vision blurred  . Hearing loss  . Left shoulder pain  . Lumbar pain   Past Medical History  Diagnosis Date  . A-fib     chronic  /  holter 06/2010, no brady, mild tachy  . Tachycardia     holter, 06/2010, no brady, mild  tachy  . CAD (coronary artery disease)     2  DES, [facility], 2004  . Pleural effusion associated with pulmonary infection 03-2007  . Hypothyroid   . Hyperlipidemia   . GERD (gastroesophageal reflux disease)   . Diverticulosis of colon   . Allergy   . Anemia   . Arthritis     osteo  . Hypertension   . COPD (chronic obstructive pulmonary disease)   . Mitral regurgitation     moderate, echo, 06/2010  . Vitamin B deficiency   . Compression fracture of lumbar vertebra 05-2008  . Osteoporosis   . Thrombocytopenia   . SOB (shortness of breath)   . OA (osteoarthritis)   . LBBB (left bundle branch block)   . Hx of colonoscopy   . Warfarin anticoagulation   . Ejection fraction     50% echo, 2008  /  EF  35-40%, echo, 06/2010  . TR (tricuspid regurgitation)     moderate, echo 2008  . Dyslipidemia   . Lung abnormality     Dr Shelle Iron 122/2011 no further w/u needed  . Prolonged QT interval     when on sotolol in past  . Volume overload     intermittant  . Hypertension   . Weight loss     August, 2012  Past Surgical History  Procedure Date  . Thyroidectomy 1962  . Tonsillectomy    History  Substance Use Topics  . Smoking status: Former Smoker    Quit date: 09/14/1947  . Smokeless tobacco: Not on file  . Alcohol Use: No   Family History  Problem Relation Age of Onset  . Heart disease Mother 26  . Stroke Father 88  . Heart disease Sister   . Cancer Brother     colon  . Diabetes Brother   . Leukemia Sister    Allergies  Allergen Reactions  . Alendronate Sodium     REACTION: GI  . Sulfonamide Derivatives     REACTION: sick   Current Outpatient Prescriptions on File Prior to Visit  Medication Sig Dispense Refill  . acetaminophen (TYLENOL ARTHRITIS PAIN) 650 MG CR tablet Take 650 mg by mouth every 8 (eight) hours as needed.        . Calcium Carbonate-Vitamin D (CALCIUM 600/VITAMIN D) 600-400 MG-UNIT per tablet Take 2 tablets by mouth daily.        . cyanocobalamin (,VITAMIN B-12,) 1000 MCG/ML injection Inject 1,000  mcg into the muscle every 30 (thirty) days.        . cyclobenzaprine (FLEXERIL) 10 MG tablet 1/2 to 1 pill by mouth at bedtime as needed for pain  15 tablet  0  . diltiazem (CARDIZEM CD) 120 MG 24 hr capsule Take 1 capsule (120 mg total) by mouth daily.  90 capsule  4  . ferrous sulfate 325 (65 FE) MG tablet Take 1 tablet (325 mg total) by mouth daily with breakfast.  90 tablet  3  . fluticasone (FLONASE) 50 MCG/ACT nasal spray Place 2 sprays into the nose daily.  16 g  1  . furosemide (LASIX) 20 MG tablet Take 1 tablet (20 mg total) by mouth daily.  90 tablet  4  . glucosamine-chondroitin 500-400 MG tablet Take 1 tablet by mouth daily.        [name] levothyroxine (SYNTHROID, LEVOTHROID) 88 MCG tablet Take 1 tablet (88 mcg total) by mouth daily.  90 tablet  0  . loratadine ([employer] ALLERGY RELIEF) 10 MG tablet Take 10 mg by mouth daily.        . Lutein 6 MG CAPS Take 1 capsule by mouth daily.        . Multiple Vitamin (MULTIVITAMIN) tablet Take 1 tablet by mouth daily.        [name] omeprazole (PRILOSEC) 20 MG capsule Take one tablet by mouth twice a day.  180 capsule  3  . Propylene Glycol (SYSTANE BALANCE) 0.6 % SOLN Apply to eye daily.        . simvastatin (ZOCOR) 20 MG tablet Take 1/4 tablet by mouth daily at bedtime.  45 tablet  1  . warfarin (COUMADIN) 5 MG tablet Take one by mouth daily.  90 tablet  3  . meclizine (ANTIVERT) 25 MG tablet Take 1 tablet (25 mg total) by mouth 3 (three) times daily as needed for dizziness or nausea.  30 tablet  1  . metoprolol (LOPRESSOR) 50 MG tablet Take 0.5 tablets (25 mg total) by mouth 2 (two) times daily.  90 tablet  3  . mupirocin (BACTROBAN) 2 % ointment Apply topically 2 (two) times daily.        . nitroGLYCERIN (NITROSTAT) 0.4 MG SL tablet Place 0.4 mg under the tongue every 5 (five) minutes as needed.         No current facility-administered medications  on file prior to visit.       Review of Systems Review of Systems  Constitutional: Negative for fever,  appetite change, fatigue and unexpected weight change.  Eyes: Negative for pain and visual disturbance.  Respiratory: Negative for cough and shortness of breath.   Cardiovascular: Negative for cp or palpitations    Gastrointestinal: Negative for nausea, diarrhea and constipation.  Genitourinary: Negative for urgency and frequency.  Skin: Negative for pallor or rash   MSK pos for back/ arm pain/ neg for joint swelling Neurological: Negative for weakness, light-headedness, numbness and headaches.  Hematological: Negative for adenopathy. Does not bruise/bleed easily.  Psychiatric/Behavioral: Negative for dysphoric mood. The patient is not nervous/anxious.          Objective:   Physical Exam  Constitutional: She appears well-developed and well-nourished. No distress.       Frail appearing elderly female  HENT:  Head: Normocephalic and atraumatic.  Mouth/Throat: Oropharynx is clear and moist.  Eyes: Conjunctivae and EOM are normal. Pupils are equal, round, and reactive to light.  Neck: Normal range of motion. Neck supple. No JVD present.  Cardiovascular: Normal rate, regular rhythm, normal heart sounds and intact distal pulses.  Exam reveals no gallop.   Pulmonary/Chest: Effort normal and breath sounds normal. No respiratory distress. She has no wheezes.  Musculoskeletal: She exhibits tenderness. She exhibits no edema.       Kyphosis and scoliosis noted  Tender over lower TS, upper LS with tight musculature  Gait is slow   R arm- tender over bicep and bicep tendon limted abduction of arm also  No swelling or skin change noted   Lymphadenopathy:    She has no cervical adenopathy.  Neurological: She is alert. She has normal strength and normal reflexes. She displays no atrophy. No sensory deficit.  Skin: Skin is warm and dry. No rash noted.  Psychiatric: She has a normal mood and affect.          Assessment & Plan:

## 2011-10-11 NOTE — Assessment & Plan Note (Signed)
For injection today - helping energy level

## 2011-10-11 NOTE — Assessment & Plan Note (Signed)
Rev dexa 11/12 OP at fn and [facility] at spine Intol fosamax On ca and D Will try evista- disc poss side eff- and update  Plan to watch this closely Disc fx risk

## 2011-10-11 NOTE — Assessment & Plan Note (Signed)
Reviewed x ray- soft tissue density is likely from a large SK at that spot Still suspect bursitis Ref to ortho for further eval

## 2011-10-11 NOTE — Patient Instructions (Addendum)
Acetaminophen regular strength can be taken 2 pills up to every 4 hours  Acetaminophen arthritis strength is 2 pills up to 3 times per day (8 hours)  We will refer you to orthopedics at check out for shoulder and back pain  ( one who has the ability to do kyphoplasty if that was an option) Start evista once daily for osteoporosis - if any side effects or problems let me know  If the acetaminophen does not give her adequate pain relief/ please call

## 2011-10-11 NOTE — Assessment & Plan Note (Signed)
Reviewed xray and pt has deg disc/ spondylosis as well as new fx at T 12 (that is likely causing most of the pain) Rev tx plan for OP  Ref to ortho  ? If would be candidate for kyphoplasty in light of coumadin and location of fx If acetaminophen does not help pain-will call for something stronger

## 2011-10-14 ENCOUNTER — Ambulatory Visit: Payer: Self-pay

## 2011-10-25 ENCOUNTER — Encounter: Payer: Self-pay | Admitting: Cardiology

## 2011-10-25 ENCOUNTER — Ambulatory Visit (INDEPENDENT_AMBULATORY_CARE_PROVIDER_SITE_OTHER): Payer: Medicare Other | Admitting: Cardiology

## 2011-10-25 DIAGNOSIS — I2510 Atherosclerotic heart disease of native coronary artery without angina pectoris: Secondary | ICD-10-CM

## 2011-10-25 DIAGNOSIS — Z01810 Encounter for preprocedural cardiovascular examination: Secondary | ICD-10-CM

## 2011-10-25 DIAGNOSIS — I447 Left bundle-branch block, unspecified: Secondary | ICD-10-CM

## 2011-10-25 DIAGNOSIS — R9430 Abnormal result of cardiovascular function study, unspecified: Secondary | ICD-10-CM

## 2011-10-25 DIAGNOSIS — R0989 Other specified symptoms and signs involving the circulatory and respiratory systems: Secondary | ICD-10-CM

## 2011-10-25 DIAGNOSIS — I4891 Unspecified atrial fibrillation: Secondary | ICD-10-CM

## 2011-10-25 DIAGNOSIS — R0602 Shortness of breath: Secondary | ICD-10-CM

## 2011-10-25 DIAGNOSIS — IMO0002 Reserved for concepts with insufficient information to code with codable children: Secondary | ICD-10-CM

## 2011-10-25 NOTE — Assessment & Plan Note (Signed)
The patient's ejection fraction is in the 40% range. At this time she does not have any overt CHF. No further workup.

## 2011-10-25 NOTE — Assessment & Plan Note (Signed)
The patient has chronic atrial fibrillation. Her rate is controlled. No change in therapy.

## 2011-10-25 NOTE — Assessment & Plan Note (Signed)
The patient is stable. She can be cleared for her kyphoplasty. It will be acceptable to hold her Coumadin for as long as 7 days prior to her procedure. It is my understanding that there is consideration for procedure on February 14. Her INR will have to be checked by her surgeon to decide if it is felt that the INR is low enough on that day for the procedure. She has taken her Coumadin up through yesterday. Today will be the first day that she is not taking her Coumadin.  She does not need any other special precautions from the cardiac viewpoint.

## 2011-10-25 NOTE — Assessment & Plan Note (Signed)
The patient has left bundle branch block. This is old. No further workup.

## 2011-10-25 NOTE — Patient Instructions (Signed)
You have been cleared for your procedure.  Your physician wants you to follow-up in: 6 months.  You will receive a reminder letter in the mail two months in advance. If you don't receive a letter, please call our office to schedule the follow-up appointment.

## 2011-10-25 NOTE — Assessment & Plan Note (Signed)
The patient has mild chronic shortness of breath. Her volume is stable. No further workup

## 2011-10-25 NOTE — Assessment & Plan Note (Signed)
The patient has known coronary disease. She had an intervention in 2004. I feel she does not need any further workup at this time.

## 2011-10-25 NOTE — Progress Notes (Signed)
HPI The patient is seen today for preop clearance for kyphoplasty. I see her for atrial fibrillation. She's been stable. She has rare chest discomfort at night time. I feel this is probably not angina. She has some exertional shortness of breath but she has no significant volume overload at this time. Allergies  Allergen Reactions  . Alendronate Sodium     REACTION: GI  . Sulfonamide Derivatives     REACTION: sick    Current Outpatient Prescriptions  Medication Sig Dispense Refill  . acetaminophen (TYLENOL ARTHRITIS PAIN) 650 MG CR tablet Take 650 mg by mouth every 8 (eight) hours as needed.        . Calcium Carbonate-Vitamin D (CALCIUM 600/VITAMIN D) 600-400 MG-UNIT per tablet Take 2 tablets by mouth daily.        . cyanocobalamin (,VITAMIN B-12,) 1000 MCG/ML injection Inject 1,000 mcg into the muscle every 30 (thirty) days.        [name] diltiazem (CARDIZEM CD) 120 MG 24 hr capsule Take 1 capsule (120 mg total) by mouth daily.  90 capsule  4  . ferrous sulfate 325 (65 FE) MG tablet Take 1 tablet (325 mg total) by mouth daily with breakfast.  90 tablet  3  . fluticasone (FLONASE) 50 MCG/ACT nasal spray Place 2 sprays into the nose daily.  16 g  1  . furosemide (LASIX) 20 MG tablet Take 1 tablet (20 mg total) by mouth daily.  90 tablet  4  . glucosamine-chondroitin 500-400 MG tablet Take 1 tablet by mouth daily.        [name] levothyroxine (SYNTHROID, LEVOTHROID) 88 MCG tablet Take 1 tablet (88 mcg total) by mouth daily.  90 tablet  0  . loratadine ([facility] ALLERGY RELIEF) 10 MG tablet Take 10 mg by mouth daily.        . Lutein 6 MG CAPS Take 1 capsule by mouth daily.        . meclizine (ANTIVERT) 25 MG tablet Take 1 tablet (25 mg total) by mouth 3 (three) times daily as needed for dizziness or nausea.  30 tablet  1  . Multiple Vitamin (MULTIVITAMIN) tablet Take 1 tablet by mouth daily.        . mupirocin (BACTROBAN) 2 % ointment Apply topically 2 (two) times daily.        . nitroGLYCERIN (NITROSTAT) 0.4  MG SL tablet Place 0.4 mg under the tongue every 5 (five) minutes as needed.        [name] omeprazole (PRILOSEC) 20 MG capsule Take one tablet by mouth twice a day.  180 capsule  3  . Propylene Glycol (SYSTANE BALANCE) 0.6 % SOLN Apply to eye daily.        . raloxifene (EVISTA) 60 MG tablet Take 1 tablet (60 mg total) by mouth daily.  30 tablet  11  . simvastatin (ZOCOR) 20 MG tablet Take 1/4 tablet by mouth daily at bedtime.  45 tablet  1  . warfarin (COUMADIN) 5 MG tablet Take one by mouth daily.  90 tablet  3    History   Social History  . Marital Status: Widowed    Spouse Name: N/A    Number of Children: N/A  . Years of Education: N/A   Occupational History  . Not on file.   Social History Main Topics  . Smoking status: Former Smoker    Quit date: 09/14/1947  . Smokeless tobacco: Not on file  . Alcohol Use: No  . Drug Use: Not on file  .  Sexually Active: Not on file   Other Topics Concern  . Not on file   Social History Narrative   Very independent Lives in [place] by herself     Family History  Problem Relation Age of Onset  . Heart disease Mother 15  . Stroke Father 68  . Heart disease Sister   . Cancer Brother     colon  . Diabetes Brother   . Leukemia Sister     Past Medical History  Diagnosis Date  . A-fib     chronic  /  holter 06/2010, no brady, mild tachy  . Tachycardia     holter, 06/2010, no brady, mild  tachy  . CAD (coronary artery disease)     2 DES, [facility], 2004  . Pleural effusion associated with pulmonary infection 03-2007  . Hypothyroid   . Hyperlipidemia   . GERD (gastroesophageal reflux disease)   . Diverticulosis of colon   . Allergy   . Anemia   . Arthritis     osteo  . Hypertension   . COPD (chronic obstructive pulmonary disease)   . Mitral regurgitation     moderate, echo, 06/2010  . Vitamin B deficiency   . Compression fracture of lumbar vertebra 05-2008  . Osteoporosis   . Thrombocytopenia   . SOB (shortness of breath)   . OA  (osteoarthritis)   . LBBB (left bundle branch block)   . Hx of colonoscopy   . Warfarin anticoagulation   . Ejection fraction     50% echo, 2008  /  EF  35-40%, echo, 06/2010  . TR (tricuspid regurgitation)     moderate, echo 2008  . Dyslipidemia   . Lung abnormality     Dr Shelle Iron 122/2011 no further w/u needed  . Prolonged QT interval     when on sotolol in past  . Volume overload     intermittant  . Hypertension   . Weight loss     August, 2012    Past Surgical History  Procedure Date  . Thyroidectomy 1962  . Tonsillectomy     ROS Patient denies fever, chills, headache, sweats, rash, change in vision, change in hearing, cough, nausea vomiting, urinary symptoms. All other systems are reviewed and are negative.  PHYSICAL EXAM Patient is here with her daughter. She is thin and frail but stable. She is oriented to person time and place. Affect is normal. Head is atraumatic. There is no jugular venous distention. Lungs reveal a few scattered rhonchi. Cardiac exam reveals S1 and S2. There is a soft systolic murmur. The abdomen is soft. There is no peripheral edema. There are no skin rashes. Filed Vitals:   10/25/11 1037  BP: 120/70  Pulse: 94  Weight: 119 lb (53.978 kg)   EKG is done today and reviewed by me. She has an interventricular conduction delay of the left bundle-branch block . There is atrial fibrillation. There is no significant change in her EKG when compared to the past.  ASSESSMENT & PLAN

## 2011-10-28 ENCOUNTER — Ambulatory Visit: Payer: Self-pay | Admitting: Unknown Physician Specialty

## 2011-10-28 LAB — BASIC METABOLIC PANEL
Anion Gap: 9 (ref 7–16)
Co2: 29 mmol/L (ref 21–32)
Creatinine: 1.12 mg/dL (ref 0.60–1.30)
EGFR (Non-African Amer.): 49 — ABNORMAL LOW
Glucose: 86 mg/dL (ref 65–99)
Osmolality: 291 (ref 275–301)
Potassium: 4.5 mmol/L (ref 3.5–5.1)
Sodium: 144 mmol/L (ref 136–145)

## 2011-10-28 LAB — URINALYSIS, COMPLETE
Bilirubin,UR: NEGATIVE
Blood: NEGATIVE
Ketone: NEGATIVE
Ph: 5 (ref 4.5–8.0)
Protein: NEGATIVE
Specific Gravity: 1.012 (ref 1.003–1.030)
Squamous Epithelial: <1
WBC UR: 3 /HPF (ref 0–5)

## 2011-10-28 LAB — CBC
HCT: 40.1 % (ref 35.0–47.0)
MCH: 31.7 pg (ref 26.0–34.0)
MCHC: 33.1 g/dL (ref 32.0–36.0)
Platelet: 126 10*3/uL — ABNORMAL LOW (ref 150–440)
RDW: 14.6 % — ABNORMAL HIGH (ref 11.5–14.5)
WBC: 8.3 10*3/uL (ref 3.6–11.0)

## 2011-11-02 ENCOUNTER — Ambulatory Visit: Payer: Self-pay | Admitting: Unknown Physician Specialty

## 2011-11-02 LAB — PROTIME-INR: Prothrombin Time: 14.3 secs (ref 11.5–14.7)

## 2011-11-04 ENCOUNTER — Ambulatory Visit (INDEPENDENT_AMBULATORY_CARE_PROVIDER_SITE_OTHER): Payer: Medicare Other | Admitting: Family Medicine

## 2011-11-04 ENCOUNTER — Other Ambulatory Visit: Payer: Self-pay | Admitting: Family Medicine

## 2011-11-04 DIAGNOSIS — I4891 Unspecified atrial fibrillation: Secondary | ICD-10-CM

## 2011-11-04 DIAGNOSIS — Z7901 Long term (current) use of anticoagulants: Secondary | ICD-10-CM

## 2011-11-04 DIAGNOSIS — Z5181 Encounter for therapeutic drug level monitoring: Secondary | ICD-10-CM

## 2011-11-04 LAB — PATHOLOGY REPORT

## 2011-11-04 NOTE — Telephone Encounter (Signed)
Patient's daughter  notified as instructed by telephone. [name] said she would continue taking med. Evista called in #90 x 3 instead of monthly rx.

## 2011-11-04 NOTE — Telephone Encounter (Signed)
Patient was given a prescription for Evista 60 mg.  It is pretty expensive.  Patient pays about $1 per pill.  Is there something else she could take that wouldn't be so expensive?  If so, can you please write it for 90 day supply and send to the pharmacy. [facility]

## 2011-11-04 NOTE — Telephone Encounter (Signed)
There is other equivalent med for that - hope in the future it will go generic  If she cannot afford it , I understand and take off her list Will refill electronically for the iron

## 2011-11-04 NOTE — Patient Instructions (Signed)
Continue 5 mg daily, recheck 2 weeks 

## 2011-11-18 ENCOUNTER — Ambulatory Visit (INDEPENDENT_AMBULATORY_CARE_PROVIDER_SITE_OTHER): Payer: Medicare Other | Admitting: Family Medicine

## 2011-11-18 DIAGNOSIS — Z7901 Long term (current) use of anticoagulants: Secondary | ICD-10-CM

## 2011-11-18 DIAGNOSIS — Z5181 Encounter for therapeutic drug level monitoring: Secondary | ICD-10-CM

## 2011-11-18 DIAGNOSIS — I4891 Unspecified atrial fibrillation: Secondary | ICD-10-CM

## 2011-11-18 NOTE — Patient Instructions (Signed)
Hold today's dose then start 5 mg daily 2.5 mg wed, recheck 2 weeks

## 2011-11-30 ENCOUNTER — Ambulatory Visit (INDEPENDENT_AMBULATORY_CARE_PROVIDER_SITE_OTHER): Payer: Medicare Other | Admitting: Internal Medicine

## 2011-11-30 DIAGNOSIS — I4891 Unspecified atrial fibrillation: Secondary | ICD-10-CM

## 2011-11-30 DIAGNOSIS — Z7901 Long term (current) use of anticoagulants: Secondary | ICD-10-CM

## 2011-11-30 DIAGNOSIS — Z5181 Encounter for therapeutic drug level monitoring: Secondary | ICD-10-CM

## 2011-11-30 NOTE — Patient Instructions (Signed)
Hold today's dose then start 5 mg daily 2.5 mg Mon,wed,Fri recheck 1 week

## 2011-12-07 ENCOUNTER — Ambulatory Visit (INDEPENDENT_AMBULATORY_CARE_PROVIDER_SITE_OTHER): Payer: Medicare Other | Admitting: Family Medicine

## 2011-12-07 DIAGNOSIS — Z7901 Long term (current) use of anticoagulants: Secondary | ICD-10-CM

## 2011-12-07 DIAGNOSIS — I4891 Unspecified atrial fibrillation: Secondary | ICD-10-CM

## 2011-12-07 DIAGNOSIS — Z5181 Encounter for therapeutic drug level monitoring: Secondary | ICD-10-CM

## 2011-12-07 LAB — POCT INR: INR: 1.7

## 2011-12-07 NOTE — Patient Instructions (Signed)
5 mg daily 2.5 mg Mon,wed,Fri recheck 2 week

## 2011-12-17 ENCOUNTER — Other Ambulatory Visit: Payer: Self-pay

## 2011-12-17 ENCOUNTER — Other Ambulatory Visit: Payer: Self-pay | Admitting: Family Medicine

## 2011-12-17 ENCOUNTER — Other Ambulatory Visit: Payer: Self-pay | Admitting: Cardiology

## 2011-12-17 MED ORDER — LEVOTHYROXINE SODIUM 88 MCG PO TABS: 88.0000 ug | ORAL_TABLET | Freq: Every day | ORAL | Status: DC

## 2011-12-17 NOTE — Telephone Encounter (Signed)
New Problem:    Patients daughter would like to request that all of her mother's medications from now on be refilled in 90 day refills. She also needs a refill of her diltiazem (CARDIZEM CD) 120 MG 24 hr capsule.

## 2011-12-21 ENCOUNTER — Ambulatory Visit (INDEPENDENT_AMBULATORY_CARE_PROVIDER_SITE_OTHER): Payer: Medicare Other | Admitting: Family Medicine

## 2011-12-21 DIAGNOSIS — I4891 Unspecified atrial fibrillation: Secondary | ICD-10-CM

## 2011-12-21 DIAGNOSIS — Z5181 Encounter for therapeutic drug level monitoring: Secondary | ICD-10-CM

## 2011-12-21 DIAGNOSIS — Z7901 Long term (current) use of anticoagulants: Secondary | ICD-10-CM

## 2012-01-04 ENCOUNTER — Ambulatory Visit (INDEPENDENT_AMBULATORY_CARE_PROVIDER_SITE_OTHER): Payer: Medicare Other | Admitting: Family Medicine

## 2012-01-04 DIAGNOSIS — Z5181 Encounter for therapeutic drug level monitoring: Secondary | ICD-10-CM

## 2012-01-04 DIAGNOSIS — I4891 Unspecified atrial fibrillation: Secondary | ICD-10-CM

## 2012-01-04 DIAGNOSIS — Z7901 Long term (current) use of anticoagulants: Secondary | ICD-10-CM

## 2012-01-04 NOTE — Patient Instructions (Signed)
5 mg daily 2.5 mg Mon, Thurs recheck 4 week

## 2012-01-07 ENCOUNTER — Ambulatory Visit: Payer: Medicare Other | Admitting: Cardiology

## 2012-01-25 ENCOUNTER — Telehealth: Payer: Self-pay | Admitting: Cardiology

## 2012-01-25 NOTE — Telephone Encounter (Signed)
Mrs Millican states that her mother has increased exertional sob and weakness.  She thinks she is gradually getting worse.  She wants to know if this is the way she is going to feel or if something can be done to improve her health.  She is requesting an appt with Dr Myrtis Ser next week.  She does not want an appt with any other cardiologist or PA.

## 2012-01-25 NOTE — Telephone Encounter (Signed)
[name], [name]' daughter was notified of date and time.

## 2012-01-25 NOTE — Telephone Encounter (Signed)
New msg Pt's daughter called about procedure for her back and to discuss some other issues prior to making an appt for her.

## 2012-01-25 NOTE — Telephone Encounter (Signed)
Add her in 2:30, 5/22

## 2012-01-31 ENCOUNTER — Ambulatory Visit (INDEPENDENT_AMBULATORY_CARE_PROVIDER_SITE_OTHER): Payer: Medicare Other | Admitting: Family Medicine

## 2012-01-31 ENCOUNTER — Encounter: Payer: Self-pay | Admitting: Family Medicine

## 2012-01-31 ENCOUNTER — Ambulatory Visit: Payer: Medicare Other

## 2012-01-31 VITALS — BP 102/70 | HR 58 | Temp 98.8°F | Ht 66.0 in | Wt 120.8 lb

## 2012-01-31 DIAGNOSIS — E039 Hypothyroidism, unspecified: Secondary | ICD-10-CM

## 2012-01-31 DIAGNOSIS — D696 Thrombocytopenia, unspecified: Secondary | ICD-10-CM

## 2012-01-31 DIAGNOSIS — I10 Essential (primary) hypertension: Secondary | ICD-10-CM

## 2012-01-31 DIAGNOSIS — M810 Age-related osteoporosis without current pathological fracture: Secondary | ICD-10-CM

## 2012-01-31 DIAGNOSIS — Z7901 Long term (current) use of anticoagulants: Secondary | ICD-10-CM

## 2012-01-31 LAB — CBC WITH DIFFERENTIAL/PLATELET
Basophils Relative: 0.7 % (ref 0.0–3.0)
Eosinophils Relative: 1.2 % (ref 0.0–5.0)
HCT: 38.4 % (ref 36.0–46.0)
Lymphs Abs: 1.3 10*3/uL (ref 0.7–4.0)
MCV: 101.7 fl — ABNORMAL HIGH (ref 78.0–100.0)
Monocytes Absolute: 0.7 10*3/uL (ref 0.1–1.0)
Neutro Abs: 4.7 10*3/uL (ref 1.4–7.7)
Platelets: 107 10*3/uL — ABNORMAL LOW (ref 150.0–400.0)
WBC: 6.8 10*3/uL (ref 4.5–10.5)

## 2012-01-31 NOTE — Assessment & Plan Note (Signed)
bp in fair control at this time  No changes needed  Disc lifstyle change with low sodium diet and exercise   Pt to follow up with cardiol about sob on exertion

## 2012-01-31 NOTE — Assessment & Plan Note (Signed)
Pt has had fractures and kyphoplasty Is intol of bisphosphenates and also evista Not interested in further tx at this time Talked about safety and fall risk Will have a walker on hand and take up rugs

## 2012-01-31 NOTE — Patient Instructions (Signed)
Continue current dose, check in 4 weeks  

## 2012-01-31 NOTE — Assessment & Plan Note (Signed)
We are watching this closely- esp in light of her coumadin  Last platelet 113 Does bruise easily  ? If poss ITP Re check today

## 2012-01-31 NOTE — Progress Notes (Signed)
Subjective:    Patient ID: [name], female    DOB: 05-03-1922, 78 y.o.   MRN: [id number]  HPI Here for f/u of chronic conditions  Is doing ok overall  Hanging in there   More sob lately- will see cardiology this week - more on exertion  Platelet ct was 113 last time-improved Bruising easily with coumadin  Watching this   OP- last dexa no signif change  Intol to fosamax Given evista -- made her drowsy and sleepy    bp is ok     Today BP Readings from Last 3 Encounters:  01/31/12 102/70  10/25/11 120/70  10/11/11 124/70    No cp or palpitations or headaches or edema  No side effects to medicines    Hypothyroid Lab Results  Component Value Date   TSH 0.36 07/09/2011     Wt is stable   Saw Dr Gerrit Heck- had cortisone shot / det disc  Also kyphoplasty Is in chronic pain - R side shoulder  No new fractures   Patient Active Problem List  Diagnoses  . HYPOTHYROIDISM  . GOUT, UNSPECIFIED  . ANEMIA, B12 DEFICIENCY  . THROMBOCYTOPENIA  . VERTIGO, BENIGN PAROXYSMAL POSITION  . TINNITUS NOS  . HYPERTENSION  . VENOUS INSUFFICIENCY  . ALLERGIC RHINITIS  . Other Diseases of Lung, not Elsewhere Classified  . GERD  . OSTEOPOROSIS NOS  . HOARSENESS  . MEDIASTINAL LYMPHADENOPATHY  . SHORTNESS OF BREATH  . FREQUENCY, URINARY  . Nonspecific (abnormal) findings on radiological and other examination of body structure  . HYPOXEMIA  . ABNORMAL CHEST XRAY  . Coccydynia  . Ejection fraction  . A-fib  . Tachycardia  . CAD (coronary artery disease)  . Mitral regurgitation  . SOB (shortness of breath)  . LBBB (left bundle branch block)  . Warfarin anticoagulation  . Ejection fraction  . TR (tricuspid regurgitation)  . Dyslipidemia  . Lung abnormality  . Prolonged QT interval  . Volume overload  . Weight loss  . Hyperlipidemia  . Vision blurred  . Hearing loss  . Left shoulder pain  . Lumbar pain  . Preop cardiovascular exam  . Nausea   Past Medical  History  Diagnosis Date  . A-fib     chronic  /  holter 06/2010, no brady, mild tachy  . Tachycardia     holter, 06/2010, no brady, mild  tachy  . CAD (coronary artery disease)     2 DES, [facility], 2004  . Pleural effusion associated with pulmonary infection 03-2007  . Hypothyroid   . Hyperlipidemia   . GERD (gastroesophageal reflux disease)   . Diverticulosis of colon   . Allergy   . Anemia   . Arthritis     osteo  . Hypertension   . COPD (chronic obstructive pulmonary disease)   . Mitral regurgitation     moderate, echo, 06/2010  . Vitamin B deficiency   . Compression fracture of lumbar vertebra 05-2008  . Osteoporosis   . Thrombocytopenia   . SOB (shortness of breath)   . OA (osteoarthritis)   . LBBB (left bundle branch block)   . Hx of colonoscopy   . Warfarin anticoagulation   . Ejection fraction     50% echo, 2008  /  EF  35-40%, echo, 06/2010  . TR (tricuspid regurgitation)     moderate, echo 2008  . Dyslipidemia   . Lung abnormality     Dr Shelle Iron 122/2011 no further w/u needed  . Prolonged  QT interval     when on sotolol in past  . Volume overload     intermittant  . Hypertension   . Weight loss     August, 2012  . Preop cardiovascular exam     Preop clearance for kyphoplasty  . Nausea     Some nausea with medications, May, 2013,   Past Surgical History  Procedure Date  . Thyroidectomy 1962  . Tonsillectomy    History  Substance Use Topics  . Smoking status: Former Smoker    Quit date: 09/14/1947  . Smokeless tobacco: Not on file  . Alcohol Use: No   Family History  Problem Relation Age of Onset  . Heart disease Mother 76  . Stroke Father 25  . Heart disease Sister   . Cancer Brother     colon  . Diabetes Brother   . Leukemia Sister    Allergies  Allergen Reactions  . Alendronate Sodium     REACTION: GI  . Sulfonamide Derivatives     REACTION: sick   Current Outpatient Prescriptions on File Prior to Visit  Medication Sig Dispense Refill   . acetaminophen (TYLENOL ARTHRITIS PAIN) 650 MG CR tablet Take 650 mg by mouth every 8 (eight) hours as needed.        . Calcium Carbonate-Vitamin D (CALCIUM 600/VITAMIN D) 600-400 MG-UNIT per tablet Take 2 tablets by mouth daily.        . cyanocobalamin (,VITAMIN B-12,) 1000 MCG/ML injection Inject 1,000 mcg into the muscle every 30 (thirty) days.        [name] diltiazem (CARDIZEM CD) 120 MG 24 hr capsule TAKE 1 CAPSULE (120 MG TOTAL) BY MOUTH DAILY.  90 capsule  3  . ferrous sulfate 325 (65 FE) MG tablet TAKE 1 TABLET EVERY DAY  90 tablet  3  . fluticasone (FLONASE) 50 MCG/ACT nasal spray Place 2 sprays into the nose daily.  16 g  1  . furosemide (LASIX) 20 MG tablet Take 1 tablet (20 mg total) by mouth daily.  90 tablet  4  . glucosamine-chondroitin 500-400 MG tablet Take 1 tablet by mouth daily.        [name] levothyroxine (SYNTHROID, LEVOTHROID) 88 MCG tablet Take 1 tablet (88 mcg total) by mouth daily.  90 tablet  1  . loratadine ([facility] ALLERGY RELIEF) 10 MG tablet Take 10 mg by mouth daily.        . Lutein 6 MG CAPS Take 1 capsule by mouth daily.        . meclizine (ANTIVERT) 25 MG tablet Take 1 tablet (25 mg total) by mouth 3 (three) times daily as needed for dizziness or nausea.  30 tablet  1  . Multiple Vitamin (MULTIVITAMIN) tablet Take 1 tablet by mouth daily.        . mupirocin (BACTROBAN) 2 % ointment Apply topically 2 (two) times daily.        . nitroGLYCERIN (NITROSTAT) 0.4 MG SL tablet Place 0.4 mg under the tongue every 5 (five) minutes as needed.        [name] omeprazole (PRILOSEC) 20 MG capsule Take one tablet by mouth twice a day.  180 capsule  3  . Propylene Glycol (SYSTANE BALANCE) 0.6 % SOLN Apply to eye daily.        [name] warfarin (COUMADIN) 5 MG tablet Take one by mouth daily.  90 tablet  3  . simvastatin (ZOCOR) 20 MG tablet Take 1/4 tablet by mouth daily at bedtime.  45 tablet  11   No current facility-administered medications on file prior to visit.      Review of Systems Review  of Systems  Constitutional: Negative for fever, appetite change, fatigue and unexpected weight change.  Eyes: Negative for pain and visual disturbance.  Respiratory: Negative for cough and shortness of breath.   Cardiovascular: Negative for cp or palpitations    Gastrointestinal: Negative for nausea, diarrhea and constipation.  Genitourinary: Negative for urgency and frequency.  Skin: Negative for pallor or rash   MSK ps for back and shoulder pain  Neurological: Negative for weakness, light-headedness, numbness and headaches.  Hematological: Negative for adenopathy. Does bruise easily Psychiatric/Behavioral: Negative for dysphoric mood. The patient is not nervous/anxious.         Objective:   Physical Exam  Constitutional: She appears well-developed and well-nourished. No distress.  HENT:  Head: Normocephalic and atraumatic.  Mouth/Throat: Oropharynx is clear and moist.  Eyes: Conjunctivae and EOM are normal. Pupils are equal, round, and reactive to light. Right eye exhibits no discharge. Left eye exhibits no discharge. No scleral icterus.  Neck: Normal range of motion. Neck supple. No JVD present. Carotid bruit is not present. No thyromegaly present.  Cardiovascular: Normal rate, regular rhythm, normal heart sounds and intact distal pulses.   Pulmonary/Chest: Effort normal and breath sounds normal. No respiratory distress. She has no wheezes.  Abdominal: Soft. Bowel sounds are normal. She exhibits no distension, no abdominal bruit and no mass. There is no tenderness.  Musculoskeletal: She exhibits tenderness. She exhibits no edema.       TS tenderness, mild  Limited rom shoulders   Lymphadenopathy:    She has no cervical adenopathy.  Neurological: She is alert. She has normal reflexes. No cranial nerve deficit. She exhibits normal muscle tone. Coordination normal.  Skin: Skin is warm and dry. No rash noted. No erythema. No pallor.       Some bruises of different ages on extremeties    Psychiatric: She has a normal mood and affect.       Mentally sharp/ cheerful          Assessment & Plan:

## 2012-01-31 NOTE — Assessment & Plan Note (Signed)
tsh today and advise No clinical changes except for some fatigue and lack of appetite (is working on regular meals)

## 2012-01-31 NOTE — Patient Instructions (Addendum)
Labs today for cbc (platelet) and also thyroid  Follow up with cardiology as planned For now make sure you get calcium and vitamin D for your bones  Be very careful about falling  Have a walker at home for days you are not as steady  No rugs! They are the number one cause of broken bones

## 2012-02-01 LAB — TSH: TSH: 0.8 u[IU]/mL (ref 0.35–5.50)

## 2012-02-02 ENCOUNTER — Encounter: Payer: Self-pay | Admitting: Cardiology

## 2012-02-02 ENCOUNTER — Ambulatory Visit (INDEPENDENT_AMBULATORY_CARE_PROVIDER_SITE_OTHER): Payer: Medicare Other | Admitting: *Deleted

## 2012-02-02 ENCOUNTER — Other Ambulatory Visit: Payer: Self-pay | Admitting: *Deleted

## 2012-02-02 ENCOUNTER — Ambulatory Visit (INDEPENDENT_AMBULATORY_CARE_PROVIDER_SITE_OTHER)
Admission: RE | Admit: 2012-02-02 | Discharge: 2012-02-02 | Disposition: A | Discharge status: Home / Self Care | Payer: Medicare Other | Source: Ambulatory Visit | Attending: Cardiology | Admitting: Cardiology

## 2012-02-02 ENCOUNTER — Ambulatory Visit (INDEPENDENT_AMBULATORY_CARE_PROVIDER_SITE_OTHER): Payer: Medicare Other | Admitting: Cardiology

## 2012-02-02 VITALS — BP 102/60 | HR 85 | Ht 69.0 in | Wt 120.0 lb

## 2012-02-02 DIAGNOSIS — I2510 Atherosclerotic heart disease of native coronary artery without angina pectoris: Secondary | ICD-10-CM

## 2012-02-02 DIAGNOSIS — E8770 Fluid overload, unspecified: Secondary | ICD-10-CM

## 2012-02-02 DIAGNOSIS — R110 Nausea: Secondary | ICD-10-CM | POA: Insufficient documentation

## 2012-02-02 DIAGNOSIS — R0989 Other specified symptoms and signs involving the circulatory and respiratory systems: Secondary | ICD-10-CM

## 2012-02-02 DIAGNOSIS — I059 Rheumatic mitral valve disease, unspecified: Secondary | ICD-10-CM

## 2012-02-02 DIAGNOSIS — E538 Deficiency of other specified B group vitamins: Secondary | ICD-10-CM

## 2012-02-02 DIAGNOSIS — E8779 Other fluid overload: Secondary | ICD-10-CM

## 2012-02-02 DIAGNOSIS — I4891 Unspecified atrial fibrillation: Secondary | ICD-10-CM

## 2012-02-02 DIAGNOSIS — R0602 Shortness of breath: Secondary | ICD-10-CM

## 2012-02-02 DIAGNOSIS — R9430 Abnormal result of cardiovascular function study, unspecified: Secondary | ICD-10-CM

## 2012-02-02 DIAGNOSIS — I340 Nonrheumatic mitral (valve) insufficiency: Secondary | ICD-10-CM

## 2012-02-02 MED ORDER — SIMVASTATIN 20 MG PO TABS: ORAL_TABLET | ORAL | Status: DC

## 2012-02-02 MED ORDER — CYANOCOBALAMIN 1000 MCG/ML IJ SOLN
1000.0000 ug | Freq: Once | INTRAMUSCULAR | Status: DC
  Administered 2012-02-02: 1000 ug via INTRAMUSCULAR

## 2012-02-02 NOTE — Progress Notes (Signed)
HPI Patient is seen in followup atrial fibrillation and shortness of breath. She's also had some nausea. It is not clear which of her medicines is causing the nausea. Her shortness of breath appears to be exertional. She is not having any chest pain. We know that her ejection fraction had decreased to the range of 40 in 2011. She has known coronary disease with stents in 2004. She does not appear to have PND or orthopnea.   Allergies  Allergen Reactions  . Alendronate Sodium     REACTION: GI  . Sulfonamide Derivatives     REACTION: sick    Current Outpatient Prescriptions  Medication Sig Dispense Refill  . acetaminophen (TYLENOL ARTHRITIS PAIN) 650 MG CR tablet Take 650 mg by mouth every 8 (eight) hours as needed.        . Calcium Carbonate-Vitamin D (CALCIUM 600/VITAMIN D) 600-400 MG-UNIT per tablet Take 2 tablets by mouth daily.        . cyanocobalamin (,VITAMIN B-12,) 1000 MCG/ML injection Inject 1,000 mcg into the muscle every 30 (thirty) days.        [name] diltiazem (CARDIZEM CD) 120 MG 24 hr capsule TAKE 1 CAPSULE (120 MG TOTAL) BY MOUTH DAILY.  90 capsule  3  . ferrous sulfate 325 (65 FE) MG tablet TAKE 1 TABLET EVERY DAY  90 tablet  3  . fluticasone (FLONASE) 50 MCG/ACT nasal spray Place 2 sprays into the nose daily.  16 g  1  . furosemide (LASIX) 20 MG tablet Take 1 tablet (20 mg total) by mouth daily.  90 tablet  4  . glucosamine-chondroitin 500-400 MG tablet Take 1 tablet by mouth daily.        [name] levothyroxine (SYNTHROID, LEVOTHROID) 88 MCG tablet Take 1 tablet (88 mcg total) by mouth daily.  90 tablet  1  . loratadine ([facility] ALLERGY RELIEF) 10 MG tablet Take 10 mg by mouth daily.        . Lutein 6 MG CAPS Take 1 capsule by mouth daily.        . meclizine (ANTIVERT) 25 MG tablet Take 1 tablet (25 mg total) by mouth 3 (three) times daily as needed for dizziness or nausea.  30 tablet  1  . Multiple Vitamin (MULTIVITAMIN) tablet Take 1 tablet by mouth daily.        . mupirocin  (BACTROBAN) 2 % ointment Apply topically 2 (two) times daily.        . nitroGLYCERIN (NITROSTAT) 0.4 MG SL tablet Place 0.4 mg under the tongue every 5 (five) minutes as needed.        [name] omeprazole (PRILOSEC) 20 MG capsule Take one tablet by mouth twice a day.  180 capsule  3  . Propylene Glycol (SYSTANE BALANCE) 0.6 % SOLN Apply to eye daily.        . simvastatin (ZOCOR) 20 MG tablet Take 1/4 tablet by mouth daily at bedtime.  45 tablet  11  . warfarin (COUMADIN) 5 MG tablet Take one by mouth daily.  90 tablet  3  . DISCONTD: diltiazem (CARDIZEM CD) 120 MG 24 hr capsule Take 1 capsule (120 mg total) by mouth daily.  90 capsule  4  . DISCONTD: simvastatin (ZOCOR) 20 MG tablet Take 1/4 tablet by mouth daily at bedtime.  45 tablet  1   Current Facility-Administered Medications  Medication Dose Route Frequency Provider Last Rate Last Dose  . DISCONTD: cyanocobalamin ((VITAMIN B-12)) injection 1,000 mcg  1,000 mcg Intramuscular Once Marne A  Tower, MD   1,000 mcg at 02/02/12 1328    History   Social History  . Marital Status: Widowed    Spouse Name: N/A    Number of Children: N/A  . Years of Education: N/A   Occupational History  . Not on file.   Social History Main Topics  . Smoking status: Former Smoker    Quit date: 09/14/1947  . Smokeless tobacco: Not on file  . Alcohol Use: No  . Drug Use: Not on file  . Sexually Active: Not on file   Other Topics Concern  . Not on file   Social History Narrative   Very independent Lives in [place] by herself     Family History  Problem Relation Age of Onset  . Heart disease Mother 20  . Stroke Father 49  . Heart disease Sister   . Cancer Brother     colon  . Diabetes Brother   . Leukemia Sister     Past Medical History  Diagnosis Date  . A-fib     chronic  /  holter 06/2010, no brady, mild tachy  . Tachycardia     holter, 06/2010, no brady, mild  tachy  . CAD (coronary artery disease)     2 DES, [facility], 2004  . Pleural  effusion associated with pulmonary infection 03-2007  . Hypothyroid   . Hyperlipidemia   . GERD (gastroesophageal reflux disease)   . Diverticulosis of colon   . Allergy   . Anemia   . Arthritis     osteo  . Hypertension   . COPD (chronic obstructive pulmonary disease)   . Mitral regurgitation     moderate, echo, 06/2010  . Vitamin B deficiency   . Compression fracture of lumbar vertebra 05-2008  . Osteoporosis   . Thrombocytopenia   . SOB (shortness of breath)   . OA (osteoarthritis)   . LBBB (left bundle branch block)   . Hx of colonoscopy   . Warfarin anticoagulation   . Ejection fraction     50% echo, 2008  /  EF  35-40%, echo, 06/2010  . TR (tricuspid regurgitation)     moderate, echo 2008  . Dyslipidemia   . Lung abnormality     Dr Shelle Iron 122/2011 no further w/u needed  . Prolonged QT interval     when on sotolol in past  . Volume overload     intermittant  . Hypertension   . Weight loss     August, 2012  . Preop cardiovascular exam     Preop clearance for kyphoplasty    Past Surgical History  Procedure Date  . Thyroidectomy 1962  . Tonsillectomy     ROS Patient denies fever, chills, headache, sweats, rash, change in vision, change in hearing, cough, urinary symptoms. All other systems are reviewed and are negative.  PHYSICAL EXAM  Patient is here with her daughter. She stable. There is no jugulovenous distention. There is question of a few rales on physical exam. Cardiac exam reveals that the rhythm is irregularly irregular. There is a soft systolic murmur. The abdomen is soft. There is trace peripheral edema. She has ecchymoses from her anticoagulation. She has kyphoscoliosis.  Filed Vitals:   02/02/12 1416  BP: 102/60  Pulse: 85  Height: 5\' 9"  (1.753 m)  Weight: 120 lb (54.432 kg)     ASSESSMENT & PLAN

## 2012-02-02 NOTE — Assessment & Plan Note (Signed)
At this point I am not clear about the etiology of shortness of breath. Her O2 sats seemed adequate. I doubt she needs home O2. She could have some mild volume overload. There may be some rales and some edema. I will await her renal function before pushing up her diuretic. Ischemia is a possibility. Myoview will be considered later. Worsening LV function and worsening mitral regurgitation and worsening right ventricular function could be a cause also. I will consider followup echo. The plan for now will be to just take chest x-ray and chemistry labs. Then be in touch with her about possibly increasing her Lasix.

## 2012-02-02 NOTE — Assessment & Plan Note (Signed)
The patient's atrial fib bradycardia is controlled on her current medications. She was walked in the office today with O2 sats. Her O2 sat ranged 95-98. Her heart rate did increase into the range of 105-115 with walking. There was no marked tachycardia.

## 2012-02-02 NOTE — Assessment & Plan Note (Signed)
It is not clear to me which medicine may or may not be causing her nausea. She brings a record is saying that she thinks that the nausea could be related to when she takes either diltiazem, iron, thyroid, lutein (for her eyes) , omeprazole. I cannot be sure which one is related. I am not convinced it is her cardiac meds.

## 2012-02-02 NOTE — Patient Instructions (Addendum)
Your physician recommends that you schedule a follow-up appointment in: June 01/2012 with Dr. Myrtis Ser.Appointment is at 2:30 PM. A chest x-ray takes a picture of the organs and structures inside the chest, including the heart, lungs, and blood vessels. This test can show several things, including, whether the heart is enlarges; whether fluid is building up in the lungs; and whether pacemaker / defibrillator leads are still in place  To R/O CHF at [facility] today. Your physician recommends that you return for lab work in: BMET today.  Your physician has recommended you make the following change in your medication:  Hold Omeprazole medication for 2 days.

## 2012-02-02 NOTE — Assessment & Plan Note (Signed)
She does have moderate mitral regurgitation. This certainly could be playing a role.

## 2012-02-02 NOTE — Assessment & Plan Note (Signed)
Her last echo in October, 2011 revealed an ejection fraction that was somewhat lower at 35-40%. After seeing her back in followup I will consider if we will repeat her echo.

## 2012-02-02 NOTE — Assessment & Plan Note (Signed)
The patient has had some volume overload. I believe she is taking in excess salt. Her total fluid intake is probably okay. She's on a small dose of a diuretic. Chemistry will be checked today. If it is stable I plan to increase her diuretic and then see her back to see how she's doing.

## 2012-02-02 NOTE — Assessment & Plan Note (Signed)
It is possible that some of her current symptoms could be related to coronary disease. However am not yet convinced. As I see her back we will consider whether we should arrange nuclear stress testing or not.

## 2012-02-03 LAB — BASIC METABOLIC PANEL
BUN: 21 mg/dL (ref 6–23)
CO2: 29 mEq/L (ref 19–32)
Chloride: 109 mEq/L (ref 96–112)
Potassium: 4.3 mEq/L (ref 3.5–5.1)

## 2012-02-04 ENCOUNTER — Telehealth: Payer: Self-pay | Admitting: Cardiology

## 2012-02-04 NOTE — Telephone Encounter (Signed)
Discussed with Lawson Fiscal will have her increase Lasix to 40 mg daily and call back Tuesday with update.  Advised daughter

## 2012-02-04 NOTE — Telephone Encounter (Signed)
Swelling in ankles and feet.  Labs and chest xray done at visit Wednesday, per daughter Dr Myrtis Ser did hear something in her lungs.  No increase in weight per daughter since yesterday, just the swelling in feet.  Will forward to [facility] NP for review

## 2012-02-04 NOTE — Telephone Encounter (Signed)
Per pt daughter the pt has some leg swelling and they need to know if lasix can be increased

## 2012-02-08 ENCOUNTER — Ambulatory Visit: Payer: Medicare Other | Admitting: Family Medicine

## 2012-02-08 ENCOUNTER — Telehealth: Payer: Self-pay | Admitting: Cardiology

## 2012-02-08 NOTE — Telephone Encounter (Signed)
Pt is doing better with the increase in Lasix and does she needs to stay on the 40 mg

## 2012-02-09 ENCOUNTER — Encounter: Payer: Self-pay | Admitting: Cardiology

## 2012-02-09 NOTE — Telephone Encounter (Signed)
[name] has been staying with her daughter over the weekend and has been taking Lasix 40mg  daily since 02/04/12.  She denies le swelling, denies sob and denies dizziness.  States she is feeling much better.  She wants to know if she can go back home ( from her daughter) and if she can d/c the lasix?

## 2012-02-09 NOTE — Progress Notes (Signed)
   I saw the patient recently in the office may the 22nd, 2013. Chest x-ray at that time was done and showed slight increase fluid. This was on Lasix 20. Her renal function was stable. Ultimately her Lasix was increased from 20-40 mg daily. As of today her swelling is gone and she is feeling much better. I spoke with the patient's daughter today. The plan will be for her to take 20 mg of Lasix when she's feeling well. If she has an increase in shortness of breath or any swelling she'll increase the Lasix to 40 mg daily.  Also on a medical basis the patient could be allowed to return home which is her wish. Ultimately the decision is up to the patient and her daughter.

## 2012-02-09 NOTE — Telephone Encounter (Signed)
Ok per Dr Myrtis Ser for pt to decrease lasix to 20mg  daily and to go home if her daughter feels she is capable.

## 2012-02-14 ENCOUNTER — Telehealth: Payer: Self-pay

## 2012-02-14 NOTE — Telephone Encounter (Signed)
Called pt to see how she was doing.  Her daughter, Jamesetta So, said she is holding her own.  No swelling or sob today.  Weight up one pound yesterday so she took an extra lasix.  No swelling or sob today.  Weight is 114.6# today.  It was 114.0 # after arriving home from the clinic last week.  She states Mrs barbara ahart, Dr Milinda Antis wants to schedule a bone marrow biopsy for pt and then have her see a hematologist.  Pt's daughter wants Dr Myrtis Ser to look at her platelet count and advise on this issue.

## 2012-02-16 ENCOUNTER — Telehealth: Payer: Self-pay | Admitting: *Deleted

## 2012-02-16 ENCOUNTER — Ambulatory Visit: Payer: Medicare Other | Admitting: Cardiology

## 2012-02-16 NOTE — Telephone Encounter (Signed)
Thank you :)

## 2012-02-16 NOTE — Telephone Encounter (Signed)
Please let [name] daughter know that Dr.Tower's office will be in touch with them. I do agree that hematology consult would be a good idea. The hematologist can then decide if any further tests are needed

## 2012-02-16 NOTE — Telephone Encounter (Signed)
[name],  Would it be OK to have [name] see heme and let them decide if marrow needed?  Trey Paula

## 2012-02-16 NOTE — Telephone Encounter (Signed)
[facility] w/contact name & number for call back/SLS

## 2012-02-16 NOTE — Telephone Encounter (Signed)
Message copied by Regis Bill on Wed Feb 16, 2012  5:08 PM ------      Message from: [name] A      Created: Wed Feb 16, 2012  1:50 PM       Please call her to see if she wants me to go ahead and refer her to hematology- I am not directly referring her for a bone marrow biopsy-- just a visit to talk to the hematologist      Thanks and let me know

## 2012-02-16 NOTE — Telephone Encounter (Signed)
Sorry, they misunderstood.... I was not going to directly schedule the bone marrow biopsy , I was going to refer her to hematology  To discuss eval and treatment  We will contact them

## 2012-02-18 ENCOUNTER — Telehealth: Payer: Self-pay | Admitting: Family Medicine

## 2012-02-18 DIAGNOSIS — D696 Thrombocytopenia, unspecified: Secondary | ICD-10-CM

## 2012-02-18 NOTE — Telephone Encounter (Signed)
Per son-in-law [Mr. Hinshaw], patient's daughter [[name]] and patient have decided to "hold off" on seeing hematology at this time; informed if patient decides she would like a consult to call & inform us & we will set up OV/SLS

## 2012-02-18 NOTE — Telephone Encounter (Signed)
They called back and changed their minds and I did the referral

## 2012-02-18 NOTE — Telephone Encounter (Signed)
Daughter Jamesetta So just called to tell you that they do want to go back to see Dr Welton Flakes at the [facility] . Her husband was incorrect in telling Jasmine December that they didn't want the referral. Dr Myrtis Ser recommended them  To go back to ses Dr Welton Flakes, Please put a referral in and I will call them to get her scheduled.

## 2012-02-18 NOTE — Telephone Encounter (Signed)
Pt's daughter, Jamesetta So, was notified that Dr Myrtis Ser agrees with the hematology consult.

## 2012-02-18 NOTE — Telephone Encounter (Signed)
Follow up on previous call:  Per Jamesetta So patient daughter calling back to speak with nurse Eunice Blase.

## 2012-02-18 NOTE — Telephone Encounter (Signed)
Happy to do that - will do referral right now

## 2012-02-22 ENCOUNTER — Telehealth: Payer: Self-pay | Admitting: Medical Oncology

## 2012-02-22 ENCOUNTER — Telehealth: Payer: Self-pay | Admitting: Family Medicine

## 2012-02-22 NOTE — Telephone Encounter (Signed)
Caller: [name]/Child; PCP: [name] A.; CB#: [phone]; Call regarding Cough/Congestion; onset "last week". Afebrile/tactile.  Relates sx started in sinuses but now in her chest.  Conferenced patient into call at [phone].  Pt. reports foamy white to yellow sputum and cough that worsens gradually when lying down and relieved after being in sitting or standing position.  Advised see in 24 hours per Cough - Adult protocol.   Appointment w/ Dr. Patsy Lager at 1515 on 02/23/12 per caller request since none available with Dr. Milinda Antis.

## 2012-02-23 ENCOUNTER — Encounter: Payer: Self-pay | Admitting: Family Medicine

## 2012-02-23 ENCOUNTER — Telehealth: Payer: Self-pay | Admitting: *Deleted

## 2012-02-23 ENCOUNTER — Ambulatory Visit (INDEPENDENT_AMBULATORY_CARE_PROVIDER_SITE_OTHER): Payer: Medicare Other | Admitting: Family Medicine

## 2012-02-23 ENCOUNTER — Other Ambulatory Visit: Payer: Self-pay | Admitting: Medical Oncology

## 2012-02-23 VITALS — BP 120/70 | HR 106 | Temp 99.0°F | Ht 69.0 in | Wt 115.0 lb

## 2012-02-23 DIAGNOSIS — Z5181 Encounter for therapeutic drug level monitoring: Secondary | ICD-10-CM

## 2012-02-23 DIAGNOSIS — Z7901 Long term (current) use of anticoagulants: Secondary | ICD-10-CM

## 2012-02-23 DIAGNOSIS — J219 Acute bronchiolitis, unspecified: Secondary | ICD-10-CM

## 2012-02-23 DIAGNOSIS — I2510 Atherosclerotic heart disease of native coronary artery without angina pectoris: Secondary | ICD-10-CM

## 2012-02-23 DIAGNOSIS — J218 Acute bronchiolitis due to other specified organisms: Secondary | ICD-10-CM

## 2012-02-23 LAB — POCT INR: INR: 1.8

## 2012-02-23 MED ORDER — DOXYCYCLINE HYCLATE 100 MG PO TABS: 100.0000 mg | ORAL_TABLET | Freq: Two times a day (BID) | ORAL | Status: AC

## 2012-02-23 NOTE — Patient Instructions (Signed)
1/2 tablet while taking antibiotics  Repeat INR on Tuesday

## 2012-02-23 NOTE — Telephone Encounter (Signed)
sent nurse Morrie Sheldon a message threw epic to inform her that the patient's daughter said they could not come in on 02-29-2012

## 2012-02-23 NOTE — Progress Notes (Signed)
Nature conservation officer at [facility] [address] [place] [address] Phone: [phone] Fax: [phone]   Patient Name: [name] Date of Birth: Jan 13, 1922 Age: 78 y.o. Medical Record Number: [id number] Gender: female Date of Encounter: 02/23/2012  History of Present Illness:  [name] is a 78 y.o. very pleasant female patient who presents with the following:  Multiple medical problems on Coumadin for A-fib with 2 weeks of feeling week and productive cough.  Has really been sick and feeling sick and feeling sick. Coughing was losing her breath. Was coughing up some yellow stuff. Some short winded. No known pulmonary diease and historical nonsmoker  Past Medical History, Surgical History, Social History, Family History, Problem List, Medications, and Allergies have been reviewed and updated if relevant.  Prior to Admission medications   Medication Sig Start Date End Date Taking? Authorizing Provider  acetaminophen (TYLENOL ARTHRITIS PAIN) 650 MG CR tablet Take 650 mg by mouth every 8 (eight) hours as needed.     Yes Historical Provider, MD  Calcium Carbonate-Vitamin D (CALCIUM 600/VITAMIN D) 600-400 MG-UNIT per tablet Take 2 tablets by mouth daily.     Yes Historical Provider, MD  cyanocobalamin (,VITAMIN B-12,) 1000 MCG/ML injection Inject 1,000 mcg into the muscle every 30 (thirty) days.     Yes Historical Provider, MD  diltiazem (CARDIZEM CD) 120 MG 24 hr capsule TAKE 1 CAPSULE (120 MG TOTAL) BY MOUTH DAILY. 12/17/11  Yes Luis Abed, MD  ferrous sulfate 325 (65 FE) MG tablet TAKE 1 TABLET EVERY DAY 11/04/11  Yes Judy Pimple, MD  fluticasone (FLONASE) 50 MCG/ACT nasal spray Place 2 sprays into the nose daily. 05/05/11  Yes Judy Pimple, MD  furosemide (LASIX) 20 MG tablet Take 1 tablet (20 mg total) by mouth daily. 05/07/11  Yes Luis Abed, MD  glucosamine-chondroitin 500-400 MG tablet Take 1 tablet by mouth daily.     Yes Historical Provider, MD    levothyroxine (SYNTHROID, LEVOTHROID) 88 MCG tablet Take 1 tablet (88 mcg total) by mouth daily. 12/17/11  Yes Luis Abed, MD  loratadine ([employer] ALLERGY RELIEF) 10 MG tablet Take 10 mg by mouth daily.     Yes Historical Provider, MD  Lutein 6 MG CAPS Take 1 capsule by mouth daily.     Yes Historical Provider, MD  meclizine (ANTIVERT) 25 MG tablet Take 1 tablet (25 mg total) by mouth 3 (three) times daily as needed for dizziness or nausea. 03/24/11 03/23/12 Yes Judy Pimple, MD  Multiple Vitamin (MULTIVITAMIN) tablet Take 1 tablet by mouth daily.     Yes Historical Provider, MD  mupirocin (BACTROBAN) 2 % ointment Apply topically 2 (two) times daily.     Yes Historical Provider, MD  nitroGLYCERIN (NITROSTAT) 0.4 MG SL tablet Place 0.4 mg under the tongue every 5 (five) minutes as needed.     Yes Historical Provider, MD  omeprazole (PRILOSEC) 20 MG capsule Take one tablet by mouth twice a day. 08/04/11  Yes Judy Pimple, MD  Propylene Glycol (SYSTANE BALANCE) 0.6 % SOLN Apply to eye daily.     Yes Historical Provider, MD  simvastatin (ZOCOR) 20 MG tablet Take 1/4 tablet by mouth daily at bedtime. 02/02/12  Yes Judy Pimple, MD  warfarin (COUMADIN) 5 MG tablet Take one by mouth daily. 08/04/11  Yes Judy Pimple, MD    Review of Systems: ROS: GEN: Acute illness details above GI: Tolerating PO intake GU: maintaining adequate hydration and  urination Pulm: No SOB Interactive and getting along well at home.  Otherwise, ROS is as per the HPI.   Physical Examination: Filed Vitals:   02/23/12 1450  BP: 120/70  Pulse: 106  Temp: 99 F (37.2 C)   Filed Vitals:   02/23/12 1450  Height: 5\' 9"  (1.753 m)  Weight: 115 lb (52.164 kg)   Body mass index is 16.98 kg/(m^2). Ideal Body Weight: Weight in (lb) to have BMI = 25: 168.9    GEN: A and O x 3. WDWN. NAD.    ENT: Nose clear, ext NML.  No LAD.  No JVD.  TM's clear. Oropharynx clear.  PULM: Normal WOB, no distress. No crackles, wheezes,  rhonchi. CV: irreg, irreg EXT: warm and well-perfused, No c/c/e. PSYCH: Pleasant and conversant.   Assessment and Plan:  1. Bronchiolitis, acute    2. CAD (coronary artery disease)  POCT INR, POCT INR  3. Encounter for long-term (current) use of anticoagulants  POCT INR, POCT INR  4. Encounter for therapeutic drug monitoring  POCT INR, POCT INR    Doxy x 10 days Adjust coumadin, take 1/2 tab, recheck on tues   Orders Today: Orders Placed This Encounter  Procedures  . POCT INR    Standing Status: Standing     Number of Occurrences: 100     Standing Expiration Date: 02/22/2014    Medications Today: Meds ordered this encounter  Medications  . doxycycline (VIBRA-TABS) 100 MG tablet    Sig: Take 1 tablet (100 mg total) by mouth 2 (two) times daily.    Dispense:  20 tablet    Refill:  0     [name], MD

## 2012-02-23 NOTE — Telephone Encounter (Signed)
spoke with the patient's daughter and they can not come on 02-29-2012

## 2012-02-24 ENCOUNTER — Telehealth: Payer: Self-pay | Admitting: *Deleted

## 2012-02-24 NOTE — Telephone Encounter (Signed)
per patient's daughter she is not feeling she stated that she had a very bad cold she did not want to schedule her mother an appointment right now

## 2012-02-28 ENCOUNTER — Telehealth: Payer: Self-pay | Admitting: Family Medicine

## 2012-02-28 ENCOUNTER — Ambulatory Visit: Payer: Medicare Other

## 2012-02-28 NOTE — Telephone Encounter (Signed)
Appreciate the update -will see her in f/u as planned

## 2012-02-28 NOTE — Telephone Encounter (Signed)
Caller: [name]/Child; PCP: [name] A.; CB#: [phone]; ; ; Call regarding Follow Up Bronchitis; taking antibiotic/doxycycline since 02/23/12.  States has not been improving; still continues to have cough.  Denies new worsening of symptoms, wheezing, resp distress; states simply has not improved while on antibiotics.  Per protocol, emergent symptoms denied; advised appt within 24 hours.  Appt sched 02/29/12 1230 with Dr. Milinda Antis; will change her coumadin blood work as well for appt time 1230.

## 2012-02-29 ENCOUNTER — Ambulatory Visit (INDEPENDENT_AMBULATORY_CARE_PROVIDER_SITE_OTHER)
Admission: RE | Admit: 2012-02-29 | Discharge: 2012-02-29 | Disposition: A | Discharge status: Home / Self Care | Payer: Medicare Other | Source: Ambulatory Visit | Attending: Family Medicine | Admitting: Family Medicine

## 2012-02-29 ENCOUNTER — Ambulatory Visit (INDEPENDENT_AMBULATORY_CARE_PROVIDER_SITE_OTHER): Payer: Medicare Other | Admitting: Family Medicine

## 2012-02-29 ENCOUNTER — Ambulatory Visit: Payer: Medicare Other | Admitting: Oncology

## 2012-02-29 ENCOUNTER — Encounter: Payer: Self-pay | Admitting: Family Medicine

## 2012-02-29 VITALS — BP 88/62 | HR 90 | Temp 97.6°F | Ht 65.0 in | Wt 114.8 lb

## 2012-02-29 DIAGNOSIS — R058 Other specified cough: Secondary | ICD-10-CM | POA: Insufficient documentation

## 2012-02-29 DIAGNOSIS — D518 Other vitamin B12 deficiency anemias: Secondary | ICD-10-CM

## 2012-02-29 DIAGNOSIS — R05 Cough: Secondary | ICD-10-CM

## 2012-02-29 DIAGNOSIS — Z7901 Long term (current) use of anticoagulants: Secondary | ICD-10-CM

## 2012-02-29 DIAGNOSIS — I2510 Atherosclerotic heart disease of native coronary artery without angina pectoris: Secondary | ICD-10-CM

## 2012-02-29 DIAGNOSIS — Z5181 Encounter for therapeutic drug level monitoring: Secondary | ICD-10-CM

## 2012-02-29 DIAGNOSIS — E538 Deficiency of other specified B group vitamins: Secondary | ICD-10-CM

## 2012-02-29 LAB — POCT INR: INR: 1.3

## 2012-02-29 MED ORDER — CYANOCOBALAMIN 1000 MCG/ML IJ SOLN
1000.0000 ug | Freq: Once | INTRAMUSCULAR | Status: AC
  Administered 2012-02-29: 1000 ug via INTRAMUSCULAR

## 2012-02-29 NOTE — Assessment & Plan Note (Signed)
Monthly B12 shot today 

## 2012-02-29 NOTE — Patient Instructions (Addendum)
   5 mg daily except 2.5 mg Tues, Thurs. Recheck 2 weeks

## 2012-02-29 NOTE — Patient Instructions (Addendum)
Make sure to get enough fluids Rest - but get up and move around a bit too  We will call you with radiology report when it comes back [facility] your antibiotic Use the flonase nasal spray for allergies/ congestion/ ear problems

## 2012-02-29 NOTE — Progress Notes (Signed)
Subjective:    Patient ID: [name], female    DOB: Oct 25, 1921, 78 y.o.   MRN: [id number]  HPI Was here for acute bronchitis with Dr Patsy Lager on 6/12-rev that note tx with doxycycline at that time Just not feeling better  Cough is improved as is drainage  Is coughing up mucous- yellow   A little wheezing   Is more dizzy , however --- does have sinus congestion  Is really tired and sleeping a lot  Eating ok    Family thinks she is breathing too rapidly She denies sob   Watching INR is 1.8 today Will go back to regular dose of 2.5 mg daily with 5 two of those days  Daughter is worried that pt is generally tiring out faster / aging quickly  They have disc end of life issues- MOST form given to her today Needing more help but insists on living independently  Patient Active Problem List  Diagnosis  . HYPOTHYROIDISM  . GOUT, UNSPECIFIED  . ANEMIA, B12 DEFICIENCY  . THROMBOCYTOPENIA  . VERTIGO, BENIGN PAROXYSMAL POSITION  . TINNITUS NOS  . HYPERTENSION  . VENOUS INSUFFICIENCY  . ALLERGIC RHINITIS  . Other Diseases of Lung, not Elsewhere Classified  . GERD  . OSTEOPOROSIS NOS  . HOARSENESS  . MEDIASTINAL LYMPHADENOPATHY  . SHORTNESS OF BREATH  . FREQUENCY, URINARY  . Nonspecific (abnormal) findings on radiological and other examination of body structure  . HYPOXEMIA  . ABNORMAL CHEST XRAY  . Coccydynia  . Ejection fraction  . A-fib  . Tachycardia  . CAD (coronary artery disease)  . Mitral regurgitation  . SOB (shortness of breath)  . LBBB (left bundle branch block)  . Warfarin anticoagulation  . Ejection fraction  . TR (tricuspid regurgitation)  . Dyslipidemia  . Lung abnormality  . Prolonged QT interval  . Volume overload  . Weight loss  . Hyperlipidemia  . Vision blurred  . Hearing loss  . Left shoulder pain  . Lumbar pain  . Preop cardiovascular exam  . Nausea   Past Medical History  Diagnosis Date  . A-fib     chronic  /  holter 06/2010,  no brady, mild tachy  . Tachycardia     holter, 06/2010, no brady, mild  tachy  . CAD (coronary artery disease)     2 DES, [facility], 2004  . Pleural effusion associated with pulmonary infection 03-2007  . Hypothyroid   . Hyperlipidemia   . GERD (gastroesophageal reflux disease)   . Diverticulosis of colon   . Allergy   . Anemia   . Arthritis     osteo  . Hypertension   . COPD (chronic obstructive pulmonary disease)   . Mitral regurgitation     moderate, echo, 06/2010  . Vitamin B deficiency   . Compression fracture of lumbar vertebra 05-2008  . Osteoporosis   . Thrombocytopenia   . SOB (shortness of breath)   . OA (osteoarthritis)   . LBBB (left bundle branch block)   . Hx of colonoscopy   . Warfarin anticoagulation   . Ejection fraction     50% echo, 2008  /  EF  35-40%, echo, 06/2010  . TR (tricuspid regurgitation)     moderate, echo 2008  . Dyslipidemia   . Lung abnormality     Dr Shelle Iron 122/2011 no further w/u needed  . Prolonged QT interval     when on sotolol in past  . Volume overload     intermittant  .  Hypertension   . Weight loss     August, 2012  . Preop cardiovascular exam     Preop clearance for kyphoplasty  . Nausea     Some nausea with medications, May, 2013,   Past Surgical History  Procedure Date  . Thyroidectomy 1962  . Tonsillectomy    History  Substance Use Topics  . Smoking status: Former Smoker    Quit date: 09/14/1947  . Smokeless tobacco: Not on file  . Alcohol Use: No   Family History  Problem Relation Age of Onset  . Heart disease Mother 69  . Stroke Father 71  . Heart disease Sister   . Cancer Brother     colon  . Diabetes Brother   . Leukemia Sister    Allergies  Allergen Reactions  . Alendronate Sodium     REACTION: GI  . Sulfonamide Derivatives     REACTION: sick   Current Outpatient Prescriptions on File Prior to Visit  Medication Sig Dispense Refill  . acetaminophen (TYLENOL ARTHRITIS PAIN) 650 MG CR tablet Take  650 mg by mouth every 8 (eight) hours as needed.        . Calcium Carbonate-Vitamin D (CALCIUM 600/VITAMIN D) 600-400 MG-UNIT per tablet Take 2 tablets by mouth daily.        . cyanocobalamin (,VITAMIN B-12,) 1000 MCG/ML injection Inject 1,000 mcg into the muscle every 30 (thirty) days.        [name] diltiazem (CARDIZEM CD) 120 MG 24 hr capsule TAKE 1 CAPSULE (120 MG TOTAL) BY MOUTH DAILY.  90 capsule  3  . doxycycline (VIBRA-TABS) 100 MG tablet Take 1 tablet (100 mg total) by mouth 2 (two) times daily.  20 tablet  0  . ferrous sulfate 325 (65 FE) MG tablet TAKE 1 TABLET EVERY DAY  90 tablet  3  . fluticasone (FLONASE) 50 MCG/ACT nasal spray Place 2 sprays into the nose daily.  16 g  1  . furosemide (LASIX) 20 MG tablet Take 1 tablet (20 mg total) by mouth daily.  90 tablet  4  . glucosamine-chondroitin 500-400 MG tablet Take 1 tablet by mouth daily.        [name] levothyroxine (SYNTHROID, LEVOTHROID) 88 MCG tablet Take 1 tablet (88 mcg total) by mouth daily.  90 tablet  1  . loratadine ([employer]S ALLERGY RELIEF) 10 MG tablet Take 10 mg by mouth daily.        . Lutein 6 MG CAPS Take 1 capsule by mouth daily.        . meclizine (ANTIVERT) 25 MG tablet Take 1 tablet (25 mg total) by mouth 3 (three) times daily as needed for dizziness or nausea.  30 tablet  1  . Multiple Vitamin (MULTIVITAMIN) tablet Take 1 tablet by mouth daily.        . mupirocin (BACTROBAN) 2 % ointment Apply topically 2 (two) times daily.        . nitroGLYCERIN (NITROSTAT) 0.4 MG SL tablet Place 0.4 mg under the tongue every 5 (five) minutes as needed.        [name] omeprazole (PRILOSEC) 20 MG capsule Take one tablet by mouth twice a day.  180 capsule  3  . Propylene Glycol (SYSTANE BALANCE) 0.6 % SOLN Apply to eye daily.        . simvastatin (ZOCOR) 20 MG tablet Take 1/4 tablet by mouth daily at bedtime.  45 tablet  11  . DISCONTD: warfarin (COUMADIN) 5 MG tablet Take one by mouth daily.  90 tablet  3      Review of Systems Review of Systems    Constitutional: Negative for fever, appetite change,  and unexpected weight change. pos for fatigue- wants to sleep all the time ENT pos for congestion/ ear fullness/ dizziness at times Eyes: Negative for pain and visual disturbance.  Respiratory: Negative for sob , pos for prod cough   Cardiovascular: Negative for cp or palpitations    Gastrointestinal: Negative for nausea, diarrhea and constipation.  Genitourinary: Negative for urgency and frequency.  Skin: Negative for pallor or rash   Neurological: Negative for weakness, light-headedness, numbness and headaches.  Hematological: Negative for adenopathy. Does not bruise/bleed easily.  Psychiatric/Behavioral: Negative for dysphoric mood. The patient is not nervous/anxious.         Objective:   Physical Exam  Constitutional: She appears well-developed and well-nourished. No distress.       Frail appearing elderly female  HENT:  Head: Normocephalic and atraumatic.  Right Ear: External ear normal.  Left Ear: External ear normal.  Mouth/Throat: No oropharyngeal exudate.       Nares are boggy and congested TMs are dull   Eyes: Conjunctivae and EOM are normal. Pupils are equal, round, and reactive to light. Right eye exhibits no discharge. Left eye exhibits no discharge.  Neck: Normal range of motion. Neck supple. No JVD present. No tracheal deviation present. No thyromegaly present.  Cardiovascular: Normal rate, regular rhythm, normal heart sounds and intact distal pulses.   Pulmonary/Chest: Effort normal and breath sounds normal. No respiratory distress. She has no wheezes. She has no rales. She exhibits no tenderness.       Diffusely harsh bs - few scant crackles in the bases  No wheeze even on forced exp  Musculoskeletal: She exhibits no edema and no tenderness.  Lymphadenopathy:    She has no cervical adenopathy.  Neurological: She is alert. She has normal reflexes.  Skin: Skin is warm and dry. No rash noted. No erythema. No  pallor.  Psychiatric:       I think pt seems a bit down today, though she denies it           Assessment & Plan:

## 2012-02-29 NOTE — Assessment & Plan Note (Addendum)
This continues after uri - finishing doxycycline (though improved) cxr looks stable- pend rad eval Nl pulse ox Reassuring exam  Pt still quite tired from uri and also some ETD- will start back on flonase ns as well and update

## 2012-03-01 ENCOUNTER — Telehealth: Payer: Self-pay | Admitting: *Deleted

## 2012-03-01 MED ORDER — AMOXICILLIN-POT CLAVULANATE 875-125 MG PO TABS: 1.0000 | ORAL_TABLET | Freq: Two times a day (BID) | ORAL | Status: AC

## 2012-03-01 NOTE — Telephone Encounter (Signed)
Patient's daughter informed [EC-[name]]; reports patient is not doing much better w/3 days of ABX left to take, states that when patient was in [facility] in 2004 [for stents] that there was mentioning of scarring seen at that time & that patient has not seen Dr. Shelle Iron w/Pulmonology in a while. Would like to know, do you recommend an OV w/pulmonology? Also, do you recommend further ABX treatment? Please advise.

## 2012-03-01 NOTE — Telephone Encounter (Signed)
Patient's son-in-law [[name] husband in her absence] informed: Rx to pharmacy [facility] as requested]; patient is still having productive cough and then will keep [place] updated. Will have patient's daughter[EC] return call to let us know if referral is needed for Dr. Shelle Iron follow-up, or else, they will follow through with scheduling appointment/SLS

## 2012-03-01 NOTE — Telephone Encounter (Signed)
Message copied by Regis Bill on Wed Mar 01, 2012  1:02 PM ------      Message from: [name] A      Created: Wed Mar 01, 2012 12:15 PM       No new changes on cxr- no pneumonia       Has chronic issues described before related to atherosclerosis and scarring      How is she feeling today?

## 2012-03-01 NOTE — Telephone Encounter (Signed)
I do recommend f/u with Dr Shelle Iron - do they want to make appt or want [place] to refer?  Still prod cough? Any fever?  abx are tricky with her heart issues and meds - lets try augmentin -- Px written for call in   Keep me updated

## 2012-03-14 ENCOUNTER — Telehealth: Payer: Self-pay | Admitting: *Deleted

## 2012-03-14 ENCOUNTER — Ambulatory Visit: Payer: Medicare Other

## 2012-03-14 ENCOUNTER — Ambulatory Visit (INDEPENDENT_AMBULATORY_CARE_PROVIDER_SITE_OTHER): Payer: Medicare Other | Admitting: Family Medicine

## 2012-03-14 DIAGNOSIS — I2510 Atherosclerotic heart disease of native coronary artery without angina pectoris: Secondary | ICD-10-CM

## 2012-03-14 DIAGNOSIS — Z7901 Long term (current) use of anticoagulants: Secondary | ICD-10-CM

## 2012-03-14 DIAGNOSIS — Z5181 Encounter for therapeutic drug level monitoring: Secondary | ICD-10-CM

## 2012-03-14 NOTE — Telephone Encounter (Signed)
Cannot do that - as  ? What I would use based on symptoms... If we needed to do something like that I would call it in to another pharmacy close to where she is  I'm glad she is feeling better- let me know if any symptoms worsen or re occur

## 2012-03-14 NOTE — Telephone Encounter (Signed)
Pt was in for protime, she has been on abx finishes today, she no longer has cough, but is requesting to have rx for abx called in so she can take it home with her. She lives 50 miles away and has been staying with son and daughter in law while she has been sick. She wants to go home but used [employer] and cannot come back out just for rx if she starts to feel worse.

## 2012-03-14 NOTE — Telephone Encounter (Signed)
Talked with pt son and informed him that if pt needed more abx it would be called in to pharmacy close to her home.

## 2012-03-14 NOTE — Patient Instructions (Signed)
5 mg daily. Recheck Fri

## 2012-03-17 ENCOUNTER — Ambulatory Visit (INDEPENDENT_AMBULATORY_CARE_PROVIDER_SITE_OTHER): Payer: Medicare Other | Admitting: Family Medicine

## 2012-03-17 ENCOUNTER — Other Ambulatory Visit: Payer: Self-pay | Admitting: *Deleted

## 2012-03-17 DIAGNOSIS — I4891 Unspecified atrial fibrillation: Secondary | ICD-10-CM

## 2012-03-17 LAB — POCT INR: INR: 1.5

## 2012-03-17 MED ORDER — WARFARIN SODIUM 1 MG PO TABS: 1.0000 mg | ORAL_TABLET | ORAL | Status: DC

## 2012-03-17 NOTE — Progress Notes (Signed)
POC INR done and read 1.5. Patient currently taking 5mg  of coumadin daily, no missed doses. Per Dr. Connye Burkitt to 6 mg and come back Monday for recheck. Recheck scheduled. 1mg  tablet sent to pharmacy for patient.

## 2012-03-20 ENCOUNTER — Telehealth: Payer: Self-pay | Admitting: Oncology

## 2012-03-20 ENCOUNTER — Ambulatory Visit (INDEPENDENT_AMBULATORY_CARE_PROVIDER_SITE_OTHER): Payer: Medicare Other | Admitting: Family Medicine

## 2012-03-20 DIAGNOSIS — I2510 Atherosclerotic heart disease of native coronary artery without angina pectoris: Secondary | ICD-10-CM

## 2012-03-20 DIAGNOSIS — Z7901 Long term (current) use of anticoagulants: Secondary | ICD-10-CM

## 2012-03-20 DIAGNOSIS — Z5181 Encounter for therapeutic drug level monitoring: Secondary | ICD-10-CM

## 2012-03-20 NOTE — Patient Instructions (Signed)
Continue current dose, check in 1weeks  

## 2012-03-20 NOTE — Telephone Encounter (Signed)
S/w the pt's dtr and she is aware of the aug appts. Pt's dtr was unable to take the July appt that was offered to her

## 2012-03-27 ENCOUNTER — Ambulatory Visit (INDEPENDENT_AMBULATORY_CARE_PROVIDER_SITE_OTHER): Payer: Medicare Other | Admitting: Family Medicine

## 2012-03-27 DIAGNOSIS — Z5181 Encounter for therapeutic drug level monitoring: Secondary | ICD-10-CM

## 2012-03-27 DIAGNOSIS — I2510 Atherosclerotic heart disease of native coronary artery without angina pectoris: Secondary | ICD-10-CM

## 2012-03-27 DIAGNOSIS — Z7901 Long term (current) use of anticoagulants: Secondary | ICD-10-CM

## 2012-03-27 NOTE — Patient Instructions (Signed)
Continue current dose, check in 2 weeks  

## 2012-04-07 ENCOUNTER — Ambulatory Visit (INDEPENDENT_AMBULATORY_CARE_PROVIDER_SITE_OTHER): Payer: Medicare Other | Admitting: Family Medicine

## 2012-04-07 DIAGNOSIS — Z7901 Long term (current) use of anticoagulants: Secondary | ICD-10-CM

## 2012-04-07 DIAGNOSIS — Z5181 Encounter for therapeutic drug level monitoring: Secondary | ICD-10-CM

## 2012-04-07 DIAGNOSIS — I4891 Unspecified atrial fibrillation: Secondary | ICD-10-CM

## 2012-04-07 DIAGNOSIS — E538 Deficiency of other specified B group vitamins: Secondary | ICD-10-CM

## 2012-04-07 DIAGNOSIS — I2510 Atherosclerotic heart disease of native coronary artery without angina pectoris: Secondary | ICD-10-CM

## 2012-04-07 LAB — POCT INR: INR: 2.6

## 2012-04-07 MED ORDER — WARFARIN SODIUM 1 MG PO TABS: 1.0000 mg | ORAL_TABLET | ORAL | Status: DC

## 2012-04-07 MED ORDER — CYANOCOBALAMIN 1000 MCG/ML IJ SOLN
1000.0000 ug | Freq: Once | INTRAMUSCULAR | Status: AC
  Administered 2012-04-07: 1000 ug via INTRAMUSCULAR

## 2012-04-07 NOTE — Patient Instructions (Signed)
Continue current dose, check in 4 weeks  

## 2012-04-10 ENCOUNTER — Ambulatory Visit: Payer: Medicare Other

## 2012-04-28 ENCOUNTER — Ambulatory Visit: Payer: Medicare Other | Admitting: Oncology

## 2012-05-04 ENCOUNTER — Encounter: Payer: Self-pay | Admitting: Oncology

## 2012-05-04 ENCOUNTER — Other Ambulatory Visit (HOSPITAL_BASED_OUTPATIENT_CLINIC_OR_DEPARTMENT_OTHER): Payer: Medicare Other | Admitting: Lab

## 2012-05-04 ENCOUNTER — Telehealth: Payer: Self-pay | Admitting: *Deleted

## 2012-05-04 ENCOUNTER — Other Ambulatory Visit: Payer: Self-pay | Admitting: *Deleted

## 2012-05-04 ENCOUNTER — Ambulatory Visit (HOSPITAL_BASED_OUTPATIENT_CLINIC_OR_DEPARTMENT_OTHER): Payer: Medicare Other | Admitting: Oncology

## 2012-05-04 DIAGNOSIS — I428 Other cardiomyopathies: Secondary | ICD-10-CM

## 2012-05-04 DIAGNOSIS — D518 Other vitamin B12 deficiency anemias: Secondary | ICD-10-CM

## 2012-05-04 DIAGNOSIS — D696 Thrombocytopenia, unspecified: Secondary | ICD-10-CM

## 2012-05-04 DIAGNOSIS — R5381 Other malaise: Secondary | ICD-10-CM

## 2012-05-04 DIAGNOSIS — R0602 Shortness of breath: Secondary | ICD-10-CM

## 2012-05-04 LAB — CBC WITH DIFFERENTIAL/PLATELET
Basophils Absolute: 0 10*3/uL (ref 0.0–0.1)
EOS%: 0.9 % (ref 0.0–7.0)
Eosinophils Absolute: 0.1 10*3/uL (ref 0.0–0.5)
HCT: 36.6 % (ref 34.8–46.6)
HGB: 12.2 g/dL (ref 11.6–15.9)
MCH: 31.8 pg (ref 25.1–34.0)
MONO#: 0.8 10*3/uL (ref 0.1–0.9)
NEUT%: 65.4 % (ref 38.4–76.8)
lymph#: 1.1 10*3/uL (ref 0.9–3.3)

## 2012-05-04 LAB — COMPREHENSIVE METABOLIC PANEL
BUN: 20 mg/dL (ref 6–23)
CO2: 30 mEq/L (ref 19–32)
Calcium: 8.9 mg/dL (ref 8.4–10.5)
Chloride: 106 mEq/L (ref 96–112)
Creatinine, Ser: 1.01 mg/dL (ref 0.50–1.10)
Glucose, Bld: 77 mg/dL (ref 70–99)

## 2012-05-04 NOTE — Patient Instructions (Addendum)
Called Dr. Myrtis Ser for a follow up appointment  Your CBC showed a low platelt count but it has been like this for at least the past 3 years. White cell and red cells look good.  Lab Results  Component Value Date   WBC 5.8 05/04/2012   HGB 12.2 05/04/2012   HCT 36.6 05/04/2012   MCV 95.4 05/04/2012   PLT 117* 05/04/2012    Thrombocytopenia Thrombocytopenia is a condition in which there is an abnormally small number of platelets in your blood. Platelets are also called thrombocytes. Platelets are needed for blood clotting. CAUSES Thrombocytopenia is caused by:   Decreased production of platelets. This can be caused by:   Aplastic anemia in which your bone marrow quits making blood cells.   Cancer in the bone marrow.   Use of certain medicines, including chemotherapy.   Infection in the bone marrow.   Heavy alcohol consumption.   Increased destruction of platelets. This can be caused by:   Certain immune diseases.   Use of certain drugs.   Certain blood clotting disorders.   Certain inherited disorders.   Certain bleeding disorders.   Pregnancy.   Having an enlarged spleen (hypersplenism). In hypersplenism, the spleen gathers up platelets from circulation. This means the platelets are not available to help with blood clotting. The spleen can enlarge due to cirrhosis or other conditions.  SYMPTOMS  The symptoms of thrombocytopenia are side effects of poor blood clotting. Some of these are:  Abnormal bleeding.   Nosebleeds.   Heavy menstrual periods.   Blood in the urine or stools.   Purpura. This is a purplish discoloration in the skin produced by small bleeding vessels near the surface of the skin.   Bruising.   A rash that may be petechial. This looks like pinpoint, purplish-red spots on the skin and mucous membranes. It is caused by bleeding from small blood vessels (capillaries).  DIAGNOSIS  Your caregiver will make this diagnosis based on your exam and blood  tests. Sometimes, a bone marrow study is done to look for the original cells (megakaryocytes) that make platelets. TREATMENT  Treatment depends on the cause of the condition.  Medicines may be given to help protect your platelets from being destroyed.   In some cases, a replacement (transfusion) of platelets may be required to stop or prevent bleeding.   Sometimes, the spleen must be surgically removed.  HOME CARE INSTRUCTIONS   Check the skin and linings inside your mouth for bruising or bleeding as directed by your caregiver.   Check your sputum, urine, and stool for blood as directed by your caregiver.   Do not return to any activities that could cause bumps or bruises until your caregiver says it is okay.   Take extra care not to cut yourself when shaving or when using scissors, needles, knives, and other tools.   Take extra care not to burn yourself when ironing or cooking.   Ask your caregiver if it is okay for you to drink alcohol.   Only take over-the-counter or prescription medicines as directed by your caregiver.   Notify all your caregivers, including dentists and eye doctors, about your condition.  SEEK IMMEDIATE MEDICAL CARE IF:   You develop active bleeding from anywhere in your body.   You develop unexplained bruising or bleeding.   You have blood in your sputum, urine, or stool.  MAKE SURE YOU:  Understand these instructions.   Will watch your condition.   Will get help right  away if you are not doing well or get worse.  Document Released: 08/30/2005 Document Revised: 08/19/2011 Document Reviewed: 07/02/2011 [facility] Patient Information 2012 [place], [place]

## 2012-05-04 NOTE — Telephone Encounter (Signed)
Gave patient appointment for 07-04-2012 starting at 9:00am

## 2012-05-04 NOTE — Progress Notes (Signed)
OFFICE PROGRESS NOTE  CC  [name] Manns, MD [address] [place] [address], [place] [address]  DIAGNOSIS: 78 year old female with mild thrombocytopenia. Patient was originally seen by me back in 2010.  PRIOR THERAPY:  #1 patient had originally seen me in consultation back in 2010 when I was at [facility] create. At that time she was found to have mild thrombocytopenia. My recommendation was that she could most likely have an ITP or a isolated primary bone marrow problem. She was not anemic at that time. I have recommended that patient proceed with a bone marrow biopsy and aspirate for further evaluation. However they declined.  #2 since then patient was lost to followup. She is now seen in followup at the request of Dr. Vinnie Langton for weakness and fatigue and fluctuating thrombocytopenia. Clinically she has no bleeding problems.  #3 patient also has significant cardiac disease. She is followed by our cardiology group.  CURRENT THERAPY:Workup for mild thrombus cytopenia  INTERVAL HISTORY: TASHINA CREDIT 78 y.o. female returns for Followup visit at the request of Dr. Milinda Antis. Patient's main ongoing problem is fatigue. A CBC was performed today and she is found to have a platelet count of 117,000. Which is higher than when I had seen her back in 2010. Her hemoglobin is normal at 12.2 with a hematocrit of 36.6 white count is 5.8. Night having any epistaxis hematuria hematochezia melena hemoptysis or hematemesis. She does have some easy bruising on her upper extremities especially when she gets it against a surface. These bruises her primarily at the extensor surfaces and lower arms. She otherwise has no other unusual bruising or bleeding. Evidently fatigued she states that she has no energy. She is not able to ambulate without having to rest. Her daughter is very concerned. I do not think that her hemoglobin or platelets are contributing to this. My index of suspicion is this is most  likely of cardiac etiology. I have therefore recommended that they be seen by the cardiologist as soon as possible. Upon review of her medical record she has significant Opera minus with an ejection fraction between 35-40% she also has atrial fibrillation cardiomyopathy tricuspid regurgitation and mitral valve regurgitation that these may be contributing to her shortness of breath.  MEDICAL HISTORY: Past Medical History  Diagnosis Date  . A-fib     chronic  /  holter 06/2010, no brady, mild tachy  . Tachycardia     holter, 06/2010, no brady, mild  tachy  . CAD (coronary artery disease)     2 DES, [facility], 2004  . Pleural effusion associated with pulmonary infection 03-2007  . Hypothyroid   . Hyperlipidemia   . GERD (gastroesophageal reflux disease)   . Diverticulosis of colon   . Allergy   . Anemia   . Arthritis     osteo  . Hypertension   . COPD (chronic obstructive pulmonary disease)   . Mitral regurgitation     moderate, echo, 06/2010  . Vitamin B deficiency   . Compression fracture of lumbar vertebra 05-2008  . Osteoporosis   . Thrombocytopenia   . SOB (shortness of breath)   . OA (osteoarthritis)   . LBBB (left bundle branch block)   . Hx of colonoscopy   . Warfarin anticoagulation   . Ejection fraction     50% echo, 2008  /  EF  35-40%, echo, 06/2010  . TR (tricuspid regurgitation)     moderate, echo 2008  . Dyslipidemia   .  Lung abnormality     Dr Shelle Iron 122/2011 no further w/u needed  . Prolonged QT interval     when on sotolol in past  . Volume overload     intermittant  . Hypertension   . Weight loss     August, 2012  . Preop cardiovascular exam     Preop clearance for kyphoplasty  . Nausea     Some nausea with medications, May, 2013,    ALLERGIES:  is allergic to alendronate sodium and sulfonamide derivatives.  MEDICATIONS:  Current Outpatient Prescriptions  Medication Sig Dispense Refill  . acetaminophen (TYLENOL ARTHRITIS PAIN) 650 MG CR tablet Take  650 mg by mouth every 8 (eight) hours as needed.        . Calcium Carbonate-Vitamin D (CALCIUM 600/VITAMIN D) 600-400 MG-UNIT per tablet Take 2 tablets by mouth daily.        . cyanocobalamin (,VITAMIN B-12,) 1000 MCG/ML injection Inject 1,000 mcg into the muscle every 30 (thirty) days.       [name] diltiazem (CARDIZEM CD) 120 MG 24 hr capsule TAKE 1 CAPSULE (120 MG TOTAL) BY MOUTH DAILY.  90 capsule  3  . ferrous sulfate 325 (65 FE) MG tablet TAKE 1 TABLET EVERY DAY  90 tablet  3  . fluticasone (FLONASE) 50 MCG/ACT nasal spray Place 2 sprays into the nose daily.  16 g  1  . furosemide (LASIX) 20 MG tablet Take 1 tablet (20 mg total) by mouth daily.  90 tablet  4  . glucosamine-chondroitin 500-400 MG tablet Take 1 tablet by mouth daily.        [name] levothyroxine (SYNTHROID, LEVOTHROID) 88 MCG tablet Take 1 tablet (88 mcg total) by mouth daily.  90 tablet  1  . loratadine ([employer] ALLERGY RELIEF) 10 MG tablet Take 10 mg by mouth daily.        . Lutein 6 MG CAPS Take 1 capsule by mouth daily.        . Multiple Vitamin (MULTIVITAMIN) tablet Take 1 tablet by mouth daily.        . mupirocin (BACTROBAN) 2 % ointment Apply topically 2 (two) times daily.        . nitroGLYCERIN (NITROSTAT) 0.4 MG SL tablet Place 0.4 mg under the tongue every 5 (five) minutes as needed.        [name] omeprazole (PRILOSEC) 20 MG capsule Take one tablet by mouth twice a day.  180 capsule  3  . Propylene Glycol (SYSTANE BALANCE) 0.6 % SOLN Apply to eye daily.        . simvastatin (ZOCOR) 20 MG tablet Take 1/4 tablet by mouth daily at bedtime.  45 tablet  11  . warfarin (COUMADIN) 1 MG tablet Take 1 tablet (1 mg total) by mouth as directed.  90 tablet  1  . warfarin (COUMADIN) 5 MG tablet Take 2.5 mg by mouth daily. Take one by mouth daily.        SURGICAL HISTORY:  Past Surgical History  Procedure Date  . Thyroidectomy 1962  . Tonsillectomy     REVIEW OF SYSTEMS:  Pertinent items are noted in HPI.   PHYSICAL EXAMINATION: General  appearance: alert, cooperative, fatigued, flushed, moderate distress and pale Lymph nodes: no supraclavicular axillary or inguinal adenopathy Resp: patient is noted to have bilateral basilar rales Back: no CVA or spinal tenderness Cardio: Irregularly irregular rhythm murmurs are noted GI: soft, non-tender; bowel sounds normal; no masses,  no organomegaly Extremities: no edema clubbing or cyanosis Neurologic:  Grossly normal  ECOG PERFORMANCE STATUS: 2 - Symptomatic, <50% confined to bed  There were no vitals taken for this visit.  LABORATORY DATA: Lab Results  Component Value Date   WBC 5.8 05/04/2012   HGB 12.2 05/04/2012   HCT 36.6 05/04/2012   MCV 95.4 05/04/2012   PLT 117* 05/04/2012      Chemistry      Component Value Date/Time   NA 145 02/02/2012 1524   NA 144 04/15/2009 1032   K 4.3 02/02/2012 1524   K 5.7* 04/15/2009 1032   CL 109 02/02/2012 1524   CL 107 04/15/2009 1032   CO2 29 02/02/2012 1524   CO2 33 04/15/2009 1032   BUN 21 02/02/2012 1524   BUN 15 04/15/2009 1032   CREATININE 1.2 02/02/2012 1524   CREATININE 0.9 04/15/2009 1032      Component Value Date/Time   CALCIUM 8.8 02/02/2012 1524   CALCIUM 9.0 04/15/2009 1032   ALKPHOS 52 07/09/2011 1210   ALKPHOS 51 01/28/2009 1252   AST 24 07/09/2011 1210   AST 32 01/28/2009 1252   ALT 15 07/09/2011 1210   BILITOT 1.1 07/09/2011 1210   BILITOT 0.90 01/28/2009 1252       RADIOGRAPHIC STUDIES:  No results found.  ASSESSMENT: 78 year old female with mild thrombocytopenia and is fatigued with shortness of breath on exertion. Patient has significant cardiac disease including cardiomyopathy with an EF of 35-40% a true fibrillation tricuspid regurgitation and mitral valve regurgitation. Patient is seen for her thrombocytopenia. I have reviewed her blood counts from previous visits. Her thrombocyte is pia remains about stable. I do not think that this is causing her fatigue and exertional dyspnea. I do think there is a cardiac  contribution. She needs more workup for her cardiac disease. I have recommended she be seen by her cardiologist. I certainly will continue to follow the patient for the cytopenia although I do not think that this is her major issue at this time. I have reassured the patient as well as her daughter today.   PLAN: Patient is encouraged to contact her cardiologist for further cardiac workup. In the meantime I will see her back in 6 months to a year.   All questions were answered. The patient knows to call the clinic with any problems, questions or concerns. We can certainly see the patient much sooner if necessary.  I spent 30 minutes counseling the patient face to face. The total time spent in the appointment was 30 minutes.    Drue Second, MD Medical/Oncology [facility] [phone] (beeper) [phone] (Office)  05/04/2012, 3:08 PM

## 2012-05-05 ENCOUNTER — Telehealth: Payer: Self-pay | Admitting: *Deleted

## 2012-05-05 ENCOUNTER — Ambulatory Visit (INDEPENDENT_AMBULATORY_CARE_PROVIDER_SITE_OTHER): Payer: Medicare Other | Admitting: Family Medicine

## 2012-05-05 ENCOUNTER — Ambulatory Visit: Payer: Medicare Other

## 2012-05-05 ENCOUNTER — Telehealth: Payer: Self-pay | Admitting: Cardiology

## 2012-05-05 DIAGNOSIS — I2510 Atherosclerotic heart disease of native coronary artery without angina pectoris: Secondary | ICD-10-CM

## 2012-05-05 DIAGNOSIS — Z5181 Encounter for therapeutic drug level monitoring: Secondary | ICD-10-CM

## 2012-05-05 DIAGNOSIS — Z7901 Long term (current) use of anticoagulants: Secondary | ICD-10-CM

## 2012-05-05 NOTE — Telephone Encounter (Signed)
Per Dr Myrtis Ser, pt is to step on the scale when she arrives home today and then take 2 Lasix pills (40mg ).  On Sat, she is to take 40mg  (2 pills) of lasix after stepping on the scale.  On Sun and Mon, she is to weigh and then take 20mg  of Lasix (normal dose).  She was instructed to weigh herself with the same amount of clothing every morning as soon as she gets up and write it down.  She is to call on Monday and let us know her weights over the weekend.  [name] daughter, Jamesetta So, agrees.  These instructions are per Dr Myrtis Ser.

## 2012-05-05 NOTE — Patient Instructions (Signed)
Continue current dose, check in 4 weeks  

## 2012-05-05 NOTE — Telephone Encounter (Signed)
Pt complaining of feeling tired and weak x 3-4 months worse the past couple months.  She has sob since May but worse past couple of months.  She weighs daily but doesn't write it down.  She thinks she might be up 1# off and on.  She had slight edema yesterday in bil ankles but none today.  She is also having some slight chest tingling when she is laying down x 1 month.

## 2012-05-05 NOTE — Telephone Encounter (Signed)
Per pt daughter call pt has been feeling tires and weak and went to see Dr. Park Breed yesterday ans was told pt needs a sooner appt to see Dr. Myrtis Ser due to pt platelets being 117 and they have been unstable for a little while. Pt's daughter is gonna give her mother 2 lasix this am because she was told by Dr. Park Breed that pt has a little fluid around her lungs

## 2012-05-05 NOTE — Telephone Encounter (Signed)
agree

## 2012-05-05 NOTE — Telephone Encounter (Signed)
Gave patient appointment for 07-04-2012 starting at 9:00am 

## 2012-05-08 ENCOUNTER — Telehealth: Payer: Self-pay | Admitting: Cardiology

## 2012-05-08 DIAGNOSIS — I509 Heart failure, unspecified: Secondary | ICD-10-CM

## 2012-05-08 NOTE — Telephone Encounter (Signed)
Pt's daughter would like a call today regarding her mother

## 2012-05-08 NOTE — Telephone Encounter (Signed)
[name] states that she weighed 113# on Friday prior to taking the extra lasix.  Weights are as follows: Sat = 109#  (40mg  Lasix) Sun = 110# (20mg  Lasix) Mon = 112# (20mg  Lasix)  She states her sob, tiredness and weakness is not any better (about the same)

## 2012-05-08 NOTE — Telephone Encounter (Signed)
Pt to see Tereso Newcomer PA-C tomorrow and get a bmet before seeing him per Dr Ladona Ridgel.  She was notified and agrees.

## 2012-05-09 ENCOUNTER — Ambulatory Visit (INDEPENDENT_AMBULATORY_CARE_PROVIDER_SITE_OTHER): Payer: Medicare Other | Admitting: Physician Assistant

## 2012-05-09 ENCOUNTER — Other Ambulatory Visit (INDEPENDENT_AMBULATORY_CARE_PROVIDER_SITE_OTHER): Payer: Medicare Other

## 2012-05-09 ENCOUNTER — Encounter: Payer: Self-pay | Admitting: Physician Assistant

## 2012-05-09 VITALS — BP 116/58 | HR 88 | Ht 65.5 in | Wt 117.0 lb

## 2012-05-09 DIAGNOSIS — R0989 Other specified symptoms and signs involving the circulatory and respiratory systems: Secondary | ICD-10-CM

## 2012-05-09 DIAGNOSIS — I2510 Atherosclerotic heart disease of native coronary artery without angina pectoris: Secondary | ICD-10-CM

## 2012-05-09 DIAGNOSIS — R0602 Shortness of breath: Secondary | ICD-10-CM

## 2012-05-09 DIAGNOSIS — I4891 Unspecified atrial fibrillation: Secondary | ICD-10-CM

## 2012-05-09 DIAGNOSIS — I5022 Chronic systolic (congestive) heart failure: Secondary | ICD-10-CM

## 2012-05-09 LAB — BASIC METABOLIC PANEL
BUN: 23 mg/dL (ref 6–23)
CO2: 31 mEq/L (ref 19–32)
Chloride: 105 mEq/L (ref 96–112)
GFR: 47.74 mL/min — ABNORMAL LOW (ref >60.00)
Glucose, Bld: 74 mg/dL (ref 70–99)
Potassium: 4.1 mEq/L (ref 3.5–5.1)
Sodium: 142 mEq/L (ref 135–145)

## 2012-05-09 MED ORDER — FUROSEMIDE 20 MG PO TABS: 40.0000 mg | ORAL_TABLET | Freq: Every day | ORAL | Status: DC

## 2012-05-09 MED ORDER — POTASSIUM CHLORIDE ER 10 MEQ PO TBCR: 10.0000 meq | EXTENDED_RELEASE_TABLET | Freq: Every day | ORAL | Status: DC

## 2012-05-09 NOTE — Progress Notes (Signed)
[address] [place], [place]  [address] Phone: [phone] Fax:  [phone]  Date:  05/09/2012   Name:  [name]   DOB:  1922/07/01   MRN:  [id number]  PCP:  [name], MD  Primary Cardiologist:  Dr. Zackery Barefoot  Primary Electrophysiologist:  None    History of Present Illness: [name] is a 78 y.o. female who returns for evaluation of shortness of breath.  She has a history of CAD, status post PCI with DES x 2 at [facility] in 2004, cardiomyopathy with an ejection fraction of 35-40%, atrial fibrillation, tricuspid regurgitation/mitral regurgitation, HL, GERD, HTN. Echo 10/11: EF 35-40%, normal wall motion, moderate MR, moderate LAE, mild RVE, moderate RAE, moderate TR, PASP 32. Last seen by Dr. Myrtis Ser 5/13. He had been seeing her for complaints of dyspnea. There is a question of whether or not to proceed with nuclear stress testing in the future to rule out ischemia as a cause. She called in recently with dyspnea and weakness and potential weight gain. She was told to take extra Lasix. Her weight did decrease by 4 pounds initially but then started to increase again. She called back in yesterday and was added onto my schedule today.  She has worsening dyspnea with exertion over last 6 months. She denies orthopnea. She denies true PND. She's had some LE edema. She saw a hematologist last week and thought he heard rales on exam. This is when she called in and had her Lasix adjusted. She does note a tingling or pressure sensation in her chest with lying supine. This improves is sitting up. She denies exertional chest pain. She denies syncope. She does feel lightheaded when she gets short of breath. She's not really certain that she felt better on higher dose Lasix. Her weight has started to increase over last couple days.  Wt Readings from Last 3 Encounters:  05/09/12 117 lb (53.071 kg)  02/29/12 114 lb 12 oz (52.05 kg)  02/23/12 115 lb (52.164 kg)      Past Medical History  Diagnosis Date  . A-fib     chronic  /  holter 06/2010, no brady, mild tachy  . Tachycardia     holter, 06/2010, no brady, mild  tachy  . CAD (coronary artery disease)     2 DES, [facility], 2004  . Pleural effusion associated with pulmonary infection 03-2007  . Hypothyroid   . Hyperlipidemia   . GERD (gastroesophageal reflux disease)   . Diverticulosis of colon   . Allergy   . Anemia   . Arthritis     osteo  . Hypertension   . COPD (chronic obstructive pulmonary disease)   . Mitral regurgitation     moderate, echo, 06/2010  . Vitamin B deficiency   . Compression fracture of lumbar vertebra 05-2008  . Osteoporosis   . Thrombocytopenia   . SOB (shortness of breath)   . OA (osteoarthritis)   . LBBB (left bundle branch block)   . Hx of colonoscopy   . Warfarin anticoagulation   . Ejection fraction     50% echo, 2008  /  EF  35-40%, echo, 06/2010  . TR (tricuspid regurgitation)     moderate, echo 2008  . Dyslipidemia   . Lung abnormality     Dr Shelle Iron 122/2011 no further w/u needed  . Prolonged QT interval     when on sotolol in past  . Volume overload     intermittant  .  Hypertension   . Weight loss     August, 2012  . Preop cardiovascular exam     Preop clearance for kyphoplasty  . Nausea     Some nausea with medications, May, 2013,    Current Outpatient Prescriptions  Medication Sig Dispense Refill  . acetaminophen (TYLENOL ARTHRITIS PAIN) 650 MG CR tablet Take 650 mg by mouth every 8 (eight) hours as needed.        . Calcium Carbonate-Vitamin D (CALCIUM 600/VITAMIN D) 600-400 MG-UNIT per tablet Take 2 tablets by mouth daily.        . cyanocobalamin (,VITAMIN B-12,) 1000 MCG/ML injection Inject 1,000 mcg into the muscle every 30 (thirty) days.       [name] diltiazem (CARDIZEM CD) 120 MG 24 hr capsule TAKE 1 CAPSULE (120 MG TOTAL) BY MOUTH DAILY.  90 capsule  3  . ferrous sulfate 325 (65 FE) MG tablet TAKE 1 TABLET EVERY DAY  90 tablet  3  .  fluticasone (FLONASE) 50 MCG/ACT nasal spray Place 2 sprays into the nose daily.  16 g  1  . furosemide (LASIX) 20 MG tablet Take 1 tablet (20 mg total) by mouth daily.  90 tablet  4  . glucosamine-chondroitin 500-400 MG tablet Take 1 tablet by mouth daily.        [name] levothyroxine (SYNTHROID, LEVOTHROID) 88 MCG tablet Take 1 tablet (88 mcg total) by mouth daily.  90 tablet  1  . loratadine ([employer]S ALLERGY RELIEF) 10 MG tablet Take 10 mg by mouth daily.        . Lutein 6 MG CAPS Take 1 capsule by mouth daily.        . Multiple Vitamin (MULTIVITAMIN) tablet Take 1 tablet by mouth daily.        . mupirocin (BACTROBAN) 2 % ointment Apply topically 2 (two) times daily.        . nitroGLYCERIN (NITROSTAT) 0.4 MG SL tablet Place 0.4 mg under the tongue every 5 (five) minutes as needed.        [name] omeprazole (PRILOSEC) 20 MG capsule Take one tablet by mouth twice a day.  180 capsule  3  . Propylene Glycol (SYSTANE BALANCE) 0.6 % SOLN Apply to eye daily.        . simvastatin (ZOCOR) 20 MG tablet Take 1/4 tablet by mouth daily at bedtime.  45 tablet  11  . warfarin (COUMADIN) 1 MG tablet Take 1 tablet (1 mg total) by mouth as directed.  90 tablet  1  . warfarin (COUMADIN) 5 MG tablet Take 2.5 mg by mouth daily. Take one by mouth daily.        Allergies: Allergies  Allergen Reactions  . Alendronate Sodium     REACTION: GI  . Sulfonamide Derivatives     REACTION: sick    History  Substance Use Topics  . Smoking status: Former Smoker    Quit date: 09/14/1947  . Smokeless tobacco: Not on file  . Alcohol Use: No     ROS:  Please see the history of present illness.   She has a cough with clear sputum. No wheezing or hemoptysis.  All other systems reviewed and negative.   PHYSICAL EXAM: VS:  BP 116/58  Pulse 88  Ht 5' 5.5" (1.664 m)  Wt 117 lb (53.071 kg)  BMI 19.17 kg/m2 Well nourished, well developed, in no acute distress HEENT: normal Neck: no JVD Cardiac:  normal S1, S2; RRR; I cannot  appreciate a murmur Lungs:  clear to auscultation bilaterally, no wheezing, rhonchi or rales Abd: soft, nontender, no hepatomegaly Ext: Trace bilateral LE edema Skin: warm and dry Neuro:  CNs 2-12 intact, no focal abnormalities noted  EKG:  Atrial fibrillation, heart rate 88, LBBB      ASSESSMENT AND PLAN:  1. Dyspnea Etiology not entirely clear. She apparently had evidence of volume overload last week. She also had a noticeable weight decrease with increased dose of Lasix. She has chronic systolic heart failure. She also has a history of coronary artery disease. Repeat echocardiogram to reassess LV function and mitral regurgitation. Proceed with Lexiscan Myoview to rule out ischemia. Continue higher dose Lasix 40 mg daily. Add potassium 10 mg daily. Check basic metabolic panel and BNP today. Repeat basic metabolic panel in one week. Followup with me or Dr. Myrtis Ser in 2-3 weeks.  2. Coronary Artery Disease She has not been assessed for ischemia since 2004. Proceed with Myoview as noted.  3. Cardiomyopathy Etiology of her reduced ejection fraction is unclear. Repeat echocardiogram as noted and proceed with Myoview.  4. Chronic Systolic CHF  This is likely fairly stable. As noted above, I will keep her on a higher dose of Lasix.  5. Atrial Fibrillation Rate control. Continue Coumadin. If her EF remains low, she would likely benefit from discontinuation of her diltiazem and initiation of beta blocker therapy.  Signed, Tereso Newcomer, PA-C  2:47 PM 05/09/2012

## 2012-05-09 NOTE — Patient Instructions (Signed)
Your physician has recommended you make the following change in your medication: Increase Lasix to 40 mg daily.  START Potassium 10 mEq, 1 tablet qd.    Your physician has requested that you have a lexiscan myoview. For further information please visit [email or web address] Please follow instruction sheet, as given.  Your physician has requested that you have an echocardiogram. Echocardiography is a painless test that uses sound waves to create images of your heart. It provides your doctor with information about the size and shape of your heart and how well your heart's chambers and valves are working. This procedure takes approximately one hour. There are no restrictions for this procedure.  Please have blood work done today:  BMET and BNP;  Dx:  427.31  Your physician recommends that you return for lab work in: 1 week for a BMET    Your physician recommends that you schedule a follow-up appointment in: 2-3 weeks with Dr Myrtis Ser or Tereso Newcomer, PA-C on a day that Dr. Myrtis Ser is in the office.

## 2012-05-10 ENCOUNTER — Telehealth: Payer: Self-pay | Admitting: *Deleted

## 2012-05-10 NOTE — Telephone Encounter (Signed)
Message copied by Tarri Fuller on Wed May 10, 2012  5:21 PM ------      Message from: [place], [place] T      Created: Tue May 09, 2012  5:17 PM       Labs ok      BNP slightly elevated      Continue with current treatment plan.      Tereso Newcomer, PA-C  5:17 PM 05/09/2012

## 2012-05-10 NOTE — Telephone Encounter (Signed)
d/w pt's daughter lab results with verbal understanding from daughter,

## 2012-05-17 ENCOUNTER — Ambulatory Visit (HOSPITAL_COMMUNITY): Payer: Medicare Other | Attending: Cardiovascular Disease | Admitting: Radiology

## 2012-05-17 ENCOUNTER — Other Ambulatory Visit (INDEPENDENT_AMBULATORY_CARE_PROVIDER_SITE_OTHER): Payer: Medicare Other

## 2012-05-17 DIAGNOSIS — I2510 Atherosclerotic heart disease of native coronary artery without angina pectoris: Secondary | ICD-10-CM | POA: Insufficient documentation

## 2012-05-17 DIAGNOSIS — I379 Nonrheumatic pulmonary valve disorder, unspecified: Secondary | ICD-10-CM | POA: Insufficient documentation

## 2012-05-17 DIAGNOSIS — I4891 Unspecified atrial fibrillation: Secondary | ICD-10-CM | POA: Insufficient documentation

## 2012-05-17 DIAGNOSIS — I059 Rheumatic mitral valve disease, unspecified: Secondary | ICD-10-CM | POA: Insufficient documentation

## 2012-05-17 DIAGNOSIS — I5022 Chronic systolic (congestive) heart failure: Secondary | ICD-10-CM

## 2012-05-17 DIAGNOSIS — I509 Heart failure, unspecified: Secondary | ICD-10-CM

## 2012-05-17 DIAGNOSIS — R0602 Shortness of breath: Secondary | ICD-10-CM

## 2012-05-17 DIAGNOSIS — I079 Rheumatic tricuspid valve disease, unspecified: Secondary | ICD-10-CM | POA: Insufficient documentation

## 2012-05-17 DIAGNOSIS — I10 Essential (primary) hypertension: Secondary | ICD-10-CM | POA: Insufficient documentation

## 2012-05-17 LAB — BASIC METABOLIC PANEL
BUN: 22 mg/dL (ref 6–23)
Creatinine, Ser: 1.1 mg/dL (ref 0.4–1.2)
GFR: 48.23 mL/min — ABNORMAL LOW (ref >60.00)

## 2012-05-17 NOTE — Progress Notes (Signed)
Echocardiogram performed.  

## 2012-05-18 ENCOUNTER — Telehealth: Payer: Self-pay | Admitting: *Deleted

## 2012-05-18 NOTE — Telephone Encounter (Signed)
Message copied by Tarri Fuller on Thu May 18, 2012  3:56 PM ------      Message from: [place], [place] T      Created: Wed May 17, 2012  3:55 PM       Labs ok      Continue with current treatment plan.      Tereso Newcomer, PA-C  3:55 PM 05/17/2012

## 2012-05-18 NOTE — Telephone Encounter (Signed)
pt's daughter notified about lab results w/verbal understanding today

## 2012-05-19 ENCOUNTER — Telehealth: Payer: Self-pay | Admitting: *Deleted

## 2012-05-19 DIAGNOSIS — I10 Essential (primary) hypertension: Secondary | ICD-10-CM

## 2012-05-19 MED ORDER — METOPROLOL SUCCINATE ER 50 MG PO TB24: 50.0000 mg | ORAL_TABLET | Freq: Every day | ORAL | Status: DC

## 2012-05-19 NOTE — Telephone Encounter (Signed)
Message copied by Tarri Fuller on Fri May 19, 2012 10:55 AM ------      Message from: [place], [place] T      Created: Thu May 18, 2012  5:42 PM       EF stable at 35-40%      I would rather she be on a different medication than Cardizem.  Cardizem is not a good medication when EF is down.      So I would like her to stop the Cardizem.      Start Toprol XL 50 mg daily.      Keep follow up with Dr. Zackery Barefoot in a couple weeks as scheduled.      Tereso Newcomer, PA-C  5:41 PM 05/18/2012

## 2012-05-19 NOTE — Telephone Encounter (Signed)
s/w pt's daughter about echo results w/verbal understanding today. Advised to d/c cardizem and start Toprol xl 50 mg daily, rx sent in today to [facility] [address] [place]

## 2012-05-24 ENCOUNTER — Ambulatory Visit (HOSPITAL_COMMUNITY): Payer: Medicare Other | Attending: Cardiology | Admitting: Radiology

## 2012-05-24 VITALS — BP 102/65 | Ht 65.5 in | Wt 110.0 lb

## 2012-05-24 DIAGNOSIS — I428 Other cardiomyopathies: Secondary | ICD-10-CM | POA: Insufficient documentation

## 2012-05-24 DIAGNOSIS — I5022 Chronic systolic (congestive) heart failure: Secondary | ICD-10-CM

## 2012-05-24 DIAGNOSIS — I509 Heart failure, unspecified: Secondary | ICD-10-CM | POA: Insufficient documentation

## 2012-05-24 DIAGNOSIS — I4891 Unspecified atrial fibrillation: Secondary | ICD-10-CM

## 2012-05-24 DIAGNOSIS — I2510 Atherosclerotic heart disease of native coronary artery without angina pectoris: Secondary | ICD-10-CM

## 2012-05-24 DIAGNOSIS — R0989 Other specified symptoms and signs involving the circulatory and respiratory systems: Secondary | ICD-10-CM | POA: Insufficient documentation

## 2012-05-24 DIAGNOSIS — R002 Palpitations: Secondary | ICD-10-CM | POA: Insufficient documentation

## 2012-05-24 DIAGNOSIS — R079 Chest pain, unspecified: Secondary | ICD-10-CM

## 2012-05-24 DIAGNOSIS — I447 Left bundle-branch block, unspecified: Secondary | ICD-10-CM

## 2012-05-24 DIAGNOSIS — R0789 Other chest pain: Secondary | ICD-10-CM | POA: Insufficient documentation

## 2012-05-24 DIAGNOSIS — I10 Essential (primary) hypertension: Secondary | ICD-10-CM | POA: Insufficient documentation

## 2012-05-24 DIAGNOSIS — R0609 Other forms of dyspnea: Secondary | ICD-10-CM | POA: Insufficient documentation

## 2012-05-24 DIAGNOSIS — R0602 Shortness of breath: Secondary | ICD-10-CM

## 2012-05-24 DIAGNOSIS — J438 Other emphysema: Secondary | ICD-10-CM | POA: Insufficient documentation

## 2012-05-24 MED ORDER — TECHNETIUM TC 99M SESTAMIBI GENERIC - CARDIOLITE
11.0000 | Freq: Once | INTRAVENOUS | Status: AC | PRN
  Administered 2012-05-24: 11 via INTRAVENOUS

## 2012-05-24 MED ORDER — TECHNETIUM TC 99M SESTAMIBI GENERIC - CARDIOLITE
33.0000 | Freq: Once | INTRAVENOUS | Status: AC | PRN
  Administered 2012-05-24: 33 via INTRAVENOUS

## 2012-05-24 MED ORDER — ADENOSINE (DIAGNOSTIC) 3 MG/ML IV SOLN
0.5600 mg/kg | Freq: Once | INTRAVENOUS | Status: AC
  Administered 2012-05-24: 27.9 mg via INTRAVENOUS

## 2012-05-24 NOTE — Progress Notes (Signed)
[facility] SITE 3 NUCLEAR MED [address] [place] [address] [phone]  Cardiology Nuclear Med Study  [name] is a 78 y.o. female     MRN : [id number]     DOB: July 30, 1922  Procedure Date: 05/24/2012  Nuclear Med Background Indication for Stress Test:  Evaluation for Ischemia and Stent Patency  History:  COPD, Emphysema and Chronic CHF, Cardiomyopathy, 2004 Heart Cath: [facility] Stents x2  08/02/06 MPS: EF: 67% Previous inferolateral infarct with mild to moderate peri-infarct ischemia  2008 Cardioversion 9/14//13 ECHO; EF: 35-40% Cardiac Risk Factors: Carotid Disease, Family History - CAD, History of Smoking, Hypertension, LBBB and Lipids  Symptoms:  Chest Pressure, DOE, Fatigue, Light-Headedness, Nausea, Palpitations and SOB   Nuclear Pre-Procedure Caffeine/Decaff Intake:  None > 12 hrs NPO After: 8:00pm   Lungs:  clear O2 Sat: 96% on room air. IV 0.9% NS with Angio Cath:  20g  IV Site: R Antecubital x 1, tolerated well IV Started by:  Irean Hong, RN  Chest Size (in):  36 Cup Size: B  Height: 5' 5.5" (1.664 m)  Weight:  110 lb (49.896 kg)  BMI:  Body mass index is 18.03 kg/(m^2). Tech Comments:  Took Toprol this am. This patient had Adenosine and had SOB this continued even after the medication long wore off. The patient's lungs were clear and she was tachypneic. After we got her to focus on her breathing she was able to decrease her rate and feel better. Pictures clear.    Nuclear Med Study 1 or 2 day study: 1 day  Stress Test Type:  Adenosine  Reading MD: Willa Rough, MD  Order Authorizing Provider:  Willa Rough, MD, and Tereso Newcomer, [facility]  Resting Radionuclide: Technetium 54m Sestamibi  Resting Radionuclide Dose: 11.0 mCi   Stress Radionuclide:  Technetium 59m Sestamibi  Stress Radionuclide Dose: 33.0 mCi           Stress Protocol Rest HR: 65 Stress HR: 79  Rest BP: 102/65 Stress BP: 118/69  Exercise Time (min): n/a METS: n/a   Predicted  Max HR: 132 bpm % Max HR: 59.85 bpm Rate Pressure Product: 9322   Dose of Adenosine (mg):  28.0 Dose of Lexiscan: n/a mg  Dose of Atropine (mg): n/a Dose of Dobutamine: n/a mcg/kg/min (at max HR)  Stress Test Technologist: Milana Na, EMT-P  Nuclear Technologist:  Domenic Polite, CNMT     Rest Procedure:  Myocardial perfusion imaging was performed at rest 45 minutes following the intravenous administration of Technetium 56m Sestamibi. Rest ECG: AFIB with LBBB  Stress Procedure:  The patient received IV adenosine at 140 mcg/kg/min for 4 minutes.  There were non Dx changes 2nd to LBBB with infusion and rare pvcs.  Technetium 75m Sestamibi was injected at the 2 minute mark and quantitative spect images were obtained after a 45 minute delay. Stress ECG: No significant change from baseline ECG  QPS Raw Data Images:  Patient motion noted; appropriate software correction applied. Stress Images:  Normal homogeneous uptake in all areas of the myocardium. Rest Images:  Normal homogeneous uptake in all areas of the myocardium. Subtraction (SDS):  No evidence of ischemia. Transient Ischemic Dilatation (Normal <1.22):  0.94 Lung/Heart Ratio (Normal <0.45):  0.35  Quantitative Gated Spect Images QGS EDV:  80 ml QGS ESV:  34 ml  Impression Exercise Capacity:  Adenosine study with no exercise. BP Response:  Normal blood pressure response. Clinical Symptoms:  patient felt shortness of breath and some dizziness.  ECG Impression:  No significant ST segment change suggestive of ischemia. Comparison with Prior Nuclear Study: No images to compare  Overall Impression:  Normal stress nuclear study.  LV Ejection Fraction: 57%.  LV Wall Motion:  There is septal dyssynergy related to the patient's underlying left bundle branch block.  Willa Rough, MD

## 2012-05-26 ENCOUNTER — Telehealth: Payer: Self-pay | Admitting: Cardiology

## 2012-05-26 ENCOUNTER — Telehealth: Payer: Self-pay | Admitting: *Deleted

## 2012-05-26 NOTE — Telephone Encounter (Signed)
s/w pt's daughter today about stress test results, ok per Dr. Myrtis Ser. Daughter then states mother still sob and fatigued. I d/w daughter about speaking to PCP. Daughter explains that pt really wants to go back to her own home [place] [address] [place] the daughter said is about 1/2 hour away, she said pt wants to go back home tomorrow 05/27/12 and daughter said she will bring her home and be with her for most of the day. Pt does have [employer] Alert ifDaughter did state pt has anxiety and said when pt had the stress test the other day that the nuclear person had explained to the pt that she was breathing too fast and to try and relax and slow down her breathing. I explained to the pt's daugher that pt was hyperventilating and this can make her feel sob and weak like she is going to pass out and that is may be what is going on but I will d/w Tereso Newcomer, PAC and Dr. Myrtis Ser as well. I did advise to keep the appt with Dr. Myrtis Ser 9/23. I advised if pt's breathing becomes worse before 06/05/12 appt with Dr. Myrtis Ser to please  call us [phone]. Daughter gave me verbal understanding our conversation today.

## 2012-05-26 NOTE — Telephone Encounter (Signed)
pt notified about stress test results per Dr. Myrtis Ser ok, pt gave verbal understanding

## 2012-05-26 NOTE — Telephone Encounter (Signed)
Message copied by Tarri Fuller on Fri May 26, 2012 10:18 AM ------      Message from: [place], [place] T      Created: Thu May 25, 2012  2:53 PM       As noted by Dr. Zackery Barefoot      [name] Newcomer, PA-C  2:53 PM 05/25/2012

## 2012-05-26 NOTE — Telephone Encounter (Signed)
New problem  Returning call back to Coy Saunas.

## 2012-05-26 NOTE — Telephone Encounter (Signed)
Agree.  Keep follow up with Dr. Zackery Barefoot Tereso Newcomer, PA-C  5:50 PM 05/26/2012

## 2012-05-31 ENCOUNTER — Telehealth: Payer: Self-pay | Admitting: Cardiology

## 2012-05-31 NOTE — Telephone Encounter (Signed)
Dose pt need to keep appt on 23rd

## 2012-05-31 NOTE — Telephone Encounter (Signed)
Confirmed that she is supposed to see Dr Myrtis Ser on 06/05/12 according to the last office note from [facility].

## 2012-06-02 ENCOUNTER — Encounter: Payer: Self-pay | Admitting: Cardiology

## 2012-06-05 ENCOUNTER — Ambulatory Visit: Payer: Medicare Other | Admitting: Cardiology

## 2012-06-05 ENCOUNTER — Encounter: Payer: Self-pay | Admitting: Cardiology

## 2012-06-05 ENCOUNTER — Ambulatory Visit (INDEPENDENT_AMBULATORY_CARE_PROVIDER_SITE_OTHER): Payer: Medicare Other | Admitting: Cardiology

## 2012-06-05 ENCOUNTER — Ambulatory Visit: Payer: Medicare Other | Admitting: Physician Assistant

## 2012-06-05 VITALS — BP 100/60 | HR 88 | Ht 66.0 in | Wt 116.8 lb

## 2012-06-05 DIAGNOSIS — E8779 Other fluid overload: Secondary | ICD-10-CM

## 2012-06-05 DIAGNOSIS — R0602 Shortness of breath: Secondary | ICD-10-CM

## 2012-06-05 DIAGNOSIS — I4891 Unspecified atrial fibrillation: Secondary | ICD-10-CM

## 2012-06-05 DIAGNOSIS — E8770 Fluid overload, unspecified: Secondary | ICD-10-CM

## 2012-06-05 DIAGNOSIS — I2510 Atherosclerotic heart disease of native coronary artery without angina pectoris: Secondary | ICD-10-CM

## 2012-06-05 NOTE — Patient Instructions (Addendum)
**Note De-Identified  Obfuscation** Your physician recommends that you continue on your current medications as directed. Please refer to the Current Medication list given to you today.  If your weight is greater than 114 lbs. take 60 mg of Lasix daily until weight is back down to 114 lbs. then take 40 mg of Lasix daily.  Your physician wants you to follow-up in: 6 months. You will receive a reminder letter in the mail two months in advance. If you don't receive a letter, please call our office to schedule the follow-up appointment.

## 2012-06-05 NOTE — Assessment & Plan Note (Signed)
Her volume status is stable. I feel she is at the top of her except we'll wait at this time.

## 2012-06-05 NOTE — Assessment & Plan Note (Signed)
Her shortness of breath is relatively stable. Her current weight is 114 pounds on her scale. This is the maximal weight that I want her to reach. She does have a few rales. If her weight goes above 114 she is to take 60 mg of Lasix instead of 40. She should continue this until her weight comes down below 114. She can then resume 40 mg daily

## 2012-06-05 NOTE — Assessment & Plan Note (Signed)
She does have coronary disease. Nuclear scan in September, 2013 revealed no scar or ischemia.

## 2012-06-05 NOTE — Assessment & Plan Note (Signed)
The patient had stated that she thought she didn't feel well with Cardizem. This was switched to metoprolol by Mr. Alben Spittle. She's doing well with this. He will be continued.

## 2012-06-05 NOTE — Progress Notes (Signed)
OBJECTIVE:  HPI   Patient is seen back to followup shortness of breath. She had some increased shortness of breath and some chest tightness. She was seen by Mr. Alben Spittle. Eventually her Lasix dose was increased to 60 on days and her weight was up. Her weight dropped to 109 pounds on her scale. It has drifted back to 114 pounds and she is still stable with this. She has some exertional shortness of breath. There is no PND or orthopnea. Her 2-D echo reveals that her ejection fraction remains in the 35-40% range. Nuclear study revealed no definite ischemia. She is back today in stable.  Allergies  Allergen Reactions  . Alendronate Sodium     REACTION: GI  . Sulfonamide Derivatives     REACTION: sick    Current Outpatient Prescriptions  Medication Sig Dispense Refill  . acetaminophen (TYLENOL ARTHRITIS PAIN) 650 MG CR tablet Take 650 mg by mouth every 8 (eight) hours as needed.        . Calcium Carbonate-Vitamin D (CALCIUM 600/VITAMIN D) 600-400 MG-UNIT per tablet Take 2 tablets by mouth daily.        . cyanocobalamin (,VITAMIN B-12,) 1000 MCG/ML injection Inject 1,000 mcg into the muscle every 30 (thirty) days.       . ferrous sulfate 325 (65 FE) MG tablet TAKE 1 TABLET EVERY DAY  90 tablet  3  . fluticasone (FLONASE) 50 MCG/ACT nasal spray Place 2 sprays into the nose daily.  16 g  1  . furosemide (LASIX) 20 MG tablet Take 2 tablets (40 mg total) by mouth daily.  180 tablet  1  . glucosamine-chondroitin 500-400 MG tablet Take 1 tablet by mouth daily.        [name] KLOR-CON M10 10 MEQ tablet Take 10 mEq by mouth daily.       [name] levothyroxine (SYNTHROID, LEVOTHROID) 88 MCG tablet Take 1 tablet (88 mcg total) by mouth daily.  90 tablet  1  . loratadine ([facility] ALLERGY RELIEF) 10 MG tablet Take 10 mg by mouth daily.        . Lutein 6 MG CAPS Take 1 capsule by mouth daily.        . metoprolol succinate (TOPROL-XL) 50 MG 24 hr tablet Take 1 tablet (50 mg total) by mouth daily. Take with or immediately  following a meal.  90 tablet  3  . Multiple Vitamin (MULTIVITAMIN) tablet Take 1 tablet by mouth daily.        . mupirocin (BACTROBAN) 2 % ointment Apply topically 2 (two) times daily.        . nitroGLYCERIN (NITROSTAT) 0.4 MG SL tablet Place 0.4 mg under the tongue every 5 (five) minutes as needed.        [name] omeprazole (PRILOSEC) 20 MG capsule Take one tablet by mouth twice a day.  180 capsule  3  . potassium chloride (K-DUR) 10 MEQ tablet Take 1 tablet (10 mEq total) by mouth daily.  90 tablet  3  . Propylene Glycol (SYSTANE BALANCE) 0.6 % SOLN Apply to eye daily.        . simvastatin (ZOCOR) 20 MG tablet Take 1/4 tablet by mouth daily at bedtime.  45 tablet  11  . warfarin (COUMADIN) 1 MG tablet Take 1 tablet (1 mg total) by mouth as directed.  90 tablet  1  . warfarin (COUMADIN) 5 MG tablet Take 2.5 mg by mouth daily. Take one by mouth daily.        History  Social History  . Marital Status: Widowed    Spouse Name: N/A    Number of Children: N/A  . Years of Education: N/A   Occupational History  . Not on file.   Social History Main Topics  . Smoking status: Former Smoker    Quit date: 09/14/1947  . Smokeless tobacco: Not on file  . Alcohol Use: No  . Drug Use: Not on file  . Sexually Active: Not Currently   Other Topics Concern  . Not on file   Social History Narrative   Very independent Lives in [place] by herself     Family History  Problem Relation Age of Onset  . Heart disease Mother 46  . Stroke Father 53  . Heart disease Sister   . Cancer Brother     colon  . Diabetes Brother   . Leukemia Sister     Past Medical History  Diagnosis Date  . A-fib     chronic  /  holter 06/2010, no brady, mild tachy  . Tachycardia     holter, 06/2010, no brady, mild  tachy  . CAD (coronary artery disease)     2 DES, [facility], 2004  . Pleural effusion associated with pulmonary infection 03-2007  . Hypothyroid   . Hyperlipidemia   . GERD (gastroesophageal reflux disease)     . Diverticulosis of colon   . Allergy   . Anemia   . Arthritis     osteo  . Hypertension   . COPD (chronic obstructive pulmonary disease)   . Mitral regurgitation     moderate, echo, 06/2010  . Vitamin B deficiency   . Compression fracture of lumbar vertebra 05-2008  . Osteoporosis   . Thrombocytopenia   . SOB (shortness of breath)   . OA (osteoarthritis)   . LBBB (left bundle branch block)   . Hx of colonoscopy   . Warfarin anticoagulation   . Ejection fraction     50% echo, 2008  /  EF  35-40%, echo, 06/2010  . TR (tricuspid regurgitation)     moderate, echo 2008  . Dyslipidemia   . Lung abnormality     Dr Shelle Iron 122/2011 no further w/u needed  . Prolonged QT interval     when on sotolol in past  . Volume overload     intermittant  . Hypertension   . Weight loss     August, 2012  . Preop cardiovascular exam     Preop clearance for kyphoplasty  . Nausea     Some nausea with medications, May, 2013,    Past Surgical History  Procedure Date  . Thyroidectomy 1962  . Tonsillectomy     ROS    Patient denies fever, chills, headache, sweats, rash, change in vision, change in hearing, chest pain, cough, nausea vomiting, urinary symptoms. All other systems are reviewed and are negative.   PHYSICAL EXAM  Patient is here with her daughter. She is oriented to person time and place. Affect is normal. She is thin and frail but active in stable. There is no jugulovenous distention. She does have a few rales in her right base. There is no respiratory distress. Cardiac exam reveals S1 and S2. There is a soft systolic murmur. The abdomen is soft. There's no peripheral edema.  Filed Vitals:   06/05/12 0959  BP: 100/60  Pulse: 88  Height: 5\' 6"  (1.676 m)  Weight: 116 lb 12.8 oz (52.98 kg)  SpO2: 98%     ASSESSMENT &  PLAN

## 2012-06-06 ENCOUNTER — Other Ambulatory Visit: Payer: Self-pay | Admitting: Cardiology

## 2012-06-07 ENCOUNTER — Ambulatory Visit (INDEPENDENT_AMBULATORY_CARE_PROVIDER_SITE_OTHER): Payer: Medicare Other | Admitting: Family Medicine

## 2012-06-07 DIAGNOSIS — Z5181 Encounter for therapeutic drug level monitoring: Secondary | ICD-10-CM

## 2012-06-07 DIAGNOSIS — Z7901 Long term (current) use of anticoagulants: Secondary | ICD-10-CM

## 2012-06-07 DIAGNOSIS — I2510 Atherosclerotic heart disease of native coronary artery without angina pectoris: Secondary | ICD-10-CM

## 2012-06-07 LAB — POCT INR: INR: 2.7

## 2012-06-07 NOTE — Patient Instructions (Signed)
Continue 6 mg daily, recheck 4 weeks

## 2012-06-09 ENCOUNTER — Ambulatory Visit: Payer: Medicare Other

## 2012-07-03 ENCOUNTER — Ambulatory Visit (INDEPENDENT_AMBULATORY_CARE_PROVIDER_SITE_OTHER): Payer: Medicare Other | Admitting: Family Medicine

## 2012-07-03 DIAGNOSIS — Z5181 Encounter for therapeutic drug level monitoring: Secondary | ICD-10-CM

## 2012-07-03 DIAGNOSIS — Z7901 Long term (current) use of anticoagulants: Secondary | ICD-10-CM

## 2012-07-03 DIAGNOSIS — I2510 Atherosclerotic heart disease of native coronary artery without angina pectoris: Secondary | ICD-10-CM

## 2012-07-03 NOTE — Patient Instructions (Addendum)
Hold next 2 doses then restart 6 mg daily, recheck Fri

## 2012-07-04 ENCOUNTER — Ambulatory Visit: Payer: Medicare Other | Admitting: Oncology

## 2012-07-04 ENCOUNTER — Other Ambulatory Visit: Payer: Medicare Other | Admitting: Lab

## 2012-07-07 ENCOUNTER — Ambulatory Visit (INDEPENDENT_AMBULATORY_CARE_PROVIDER_SITE_OTHER): Payer: Medicare Other | Admitting: Family Medicine

## 2012-07-07 DIAGNOSIS — Z7901 Long term (current) use of anticoagulants: Secondary | ICD-10-CM

## 2012-07-07 DIAGNOSIS — Z5181 Encounter for therapeutic drug level monitoring: Secondary | ICD-10-CM

## 2012-07-07 DIAGNOSIS — I2510 Atherosclerotic heart disease of native coronary artery without angina pectoris: Secondary | ICD-10-CM

## 2012-07-07 LAB — POCT INR: INR: 1.4

## 2012-07-07 NOTE — Patient Instructions (Signed)
5 mg reduced dose by 7 mg weekly, check in 2 weeks

## 2012-07-20 ENCOUNTER — Other Ambulatory Visit: Payer: Self-pay | Admitting: Family Medicine

## 2012-07-21 ENCOUNTER — Ambulatory Visit (INDEPENDENT_AMBULATORY_CARE_PROVIDER_SITE_OTHER): Payer: Medicare Other | Admitting: Family Medicine

## 2012-07-21 ENCOUNTER — Telehealth: Payer: Self-pay | Admitting: Radiology

## 2012-07-21 DIAGNOSIS — I2510 Atherosclerotic heart disease of native coronary artery without angina pectoris: Secondary | ICD-10-CM

## 2012-07-21 DIAGNOSIS — Z5181 Encounter for therapeutic drug level monitoring: Secondary | ICD-10-CM

## 2012-07-21 DIAGNOSIS — Z7901 Long term (current) use of anticoagulants: Secondary | ICD-10-CM

## 2012-07-21 NOTE — Telephone Encounter (Signed)
No - just go ahead and get one

## 2012-07-21 NOTE — Patient Instructions (Signed)
Continue current dose, check in 3 weeks  

## 2012-07-21 NOTE — Telephone Encounter (Signed)
Patient hasn't had a Vit B12 injection since May, does she need blood work to check before she can have an injection?

## 2012-07-21 NOTE — Telephone Encounter (Signed)
Left voicemail letting pt know she doesn't need labs can just schedule a nurse visit for a B12 inj.

## 2012-07-24 NOTE — Telephone Encounter (Signed)
Pt's daughter notified, and appt scheduled for 08/11/12 for b12 inj.

## 2012-08-11 ENCOUNTER — Ambulatory Visit (INDEPENDENT_AMBULATORY_CARE_PROVIDER_SITE_OTHER): Payer: Medicare Other | Admitting: *Deleted

## 2012-08-11 ENCOUNTER — Ambulatory Visit (INDEPENDENT_AMBULATORY_CARE_PROVIDER_SITE_OTHER): Payer: Medicare Other | Admitting: Family Medicine

## 2012-08-11 DIAGNOSIS — Z5181 Encounter for therapeutic drug level monitoring: Secondary | ICD-10-CM

## 2012-08-11 DIAGNOSIS — E538 Deficiency of other specified B group vitamins: Secondary | ICD-10-CM

## 2012-08-11 DIAGNOSIS — I2510 Atherosclerotic heart disease of native coronary artery without angina pectoris: Secondary | ICD-10-CM

## 2012-08-11 DIAGNOSIS — Z7901 Long term (current) use of anticoagulants: Secondary | ICD-10-CM

## 2012-08-11 LAB — POCT INR: INR: 2.9

## 2012-08-11 MED ORDER — CYANOCOBALAMIN 1000 MCG/ML IJ SOLN
1000.0000 ug | Freq: Once | INTRAMUSCULAR | Status: AC
  Administered 2012-08-11: 1000 ug via INTRAMUSCULAR

## 2012-08-11 NOTE — Patient Instructions (Addendum)
Continue 5 mg daily, recheck 4 weeks 

## 2012-08-13 ENCOUNTER — Other Ambulatory Visit: Payer: Self-pay | Admitting: Family Medicine

## 2012-09-07 ENCOUNTER — Ambulatory Visit (INDEPENDENT_AMBULATORY_CARE_PROVIDER_SITE_OTHER): Payer: Medicare Other | Admitting: *Deleted

## 2012-09-07 ENCOUNTER — Ambulatory Visit (INDEPENDENT_AMBULATORY_CARE_PROVIDER_SITE_OTHER): Payer: Medicare Other | Admitting: General Practice

## 2012-09-07 DIAGNOSIS — Z7901 Long term (current) use of anticoagulants: Secondary | ICD-10-CM

## 2012-09-07 DIAGNOSIS — E538 Deficiency of other specified B group vitamins: Secondary | ICD-10-CM

## 2012-09-07 DIAGNOSIS — I2510 Atherosclerotic heart disease of native coronary artery without angina pectoris: Secondary | ICD-10-CM

## 2012-09-07 DIAGNOSIS — Z5181 Encounter for therapeutic drug level monitoring: Secondary | ICD-10-CM

## 2012-09-07 MED ORDER — CYANOCOBALAMIN 1000 MCG/ML IJ SOLN
1000.0000 ug | Freq: Once | INTRAMUSCULAR | Status: AC
  Administered 2012-09-07: 1000 ug via INTRAMUSCULAR

## 2012-09-13 DIAGNOSIS — B029 Zoster without complications: Secondary | ICD-10-CM

## 2012-09-13 HISTORY — DX: Zoster without complications: B02.9

## 2012-09-21 ENCOUNTER — Ambulatory Visit: Payer: Medicare Other

## 2012-10-09 ENCOUNTER — Ambulatory Visit (INDEPENDENT_AMBULATORY_CARE_PROVIDER_SITE_OTHER): Payer: Medicare Other | Admitting: General Practice

## 2012-10-09 DIAGNOSIS — Z5181 Encounter for therapeutic drug level monitoring: Secondary | ICD-10-CM

## 2012-10-09 DIAGNOSIS — Z7901 Long term (current) use of anticoagulants: Secondary | ICD-10-CM

## 2012-10-09 DIAGNOSIS — I2510 Atherosclerotic heart disease of native coronary artery without angina pectoris: Secondary | ICD-10-CM

## 2012-10-31 ENCOUNTER — Other Ambulatory Visit: Payer: Self-pay | Admitting: Family Medicine

## 2012-10-31 MED ORDER — OMEPRAZOLE 20 MG PO CPDR: DELAYED_RELEASE_CAPSULE | ORAL | Status: DC

## 2012-11-06 ENCOUNTER — Ambulatory Visit (INDEPENDENT_AMBULATORY_CARE_PROVIDER_SITE_OTHER): Payer: Medicare Other | Admitting: General Practice

## 2012-11-06 ENCOUNTER — Ambulatory Visit (INDEPENDENT_AMBULATORY_CARE_PROVIDER_SITE_OTHER): Payer: Medicare Other | Admitting: *Deleted

## 2012-11-06 DIAGNOSIS — Z5181 Encounter for therapeutic drug level monitoring: Secondary | ICD-10-CM

## 2012-11-06 DIAGNOSIS — E538 Deficiency of other specified B group vitamins: Secondary | ICD-10-CM

## 2012-11-06 DIAGNOSIS — Z7901 Long term (current) use of anticoagulants: Secondary | ICD-10-CM

## 2012-11-06 LAB — POCT INR: INR: 2

## 2012-11-06 MED ORDER — CYANOCOBALAMIN 1000 MCG/ML IJ SOLN
1000.0000 ug | Freq: Once | INTRAMUSCULAR | Status: AC
  Administered 2012-11-06: 1000 ug via INTRAMUSCULAR

## 2012-12-04 ENCOUNTER — Ambulatory Visit: Payer: Medicare Other

## 2012-12-09 ENCOUNTER — Other Ambulatory Visit: Payer: Self-pay | Admitting: Physician Assistant

## 2012-12-11 ENCOUNTER — Ambulatory Visit: Payer: Medicare Other

## 2012-12-11 ENCOUNTER — Ambulatory Visit (INDEPENDENT_AMBULATORY_CARE_PROVIDER_SITE_OTHER): Payer: Medicare Other | Admitting: General Practice

## 2012-12-11 ENCOUNTER — Telehealth: Payer: Self-pay | Admitting: *Deleted

## 2012-12-11 DIAGNOSIS — Z7901 Long term (current) use of anticoagulants: Secondary | ICD-10-CM

## 2012-12-11 DIAGNOSIS — I2510 Atherosclerotic heart disease of native coronary artery without angina pectoris: Secondary | ICD-10-CM

## 2012-12-11 DIAGNOSIS — E039 Hypothyroidism, unspecified: Secondary | ICD-10-CM

## 2012-12-11 DIAGNOSIS — Z5181 Encounter for therapeutic drug level monitoring: Secondary | ICD-10-CM

## 2012-12-11 MED ORDER — FUROSEMIDE 20 MG PO TABS: ORAL_TABLET | ORAL | Status: DC

## 2012-12-11 MED ORDER — LEVOTHYROXINE SODIUM 88 MCG PO TABS: ORAL_TABLET | ORAL | Status: DC

## 2012-12-11 NOTE — Telephone Encounter (Signed)
Ok to refill synthroid per Dr Myrtis Ser.  Rx sent to pharmacy.

## 2012-12-11 NOTE — Telephone Encounter (Signed)
Pt needs Sythroid refilled not sure if Dr, Myrtis Ser refills this med, will route to his nurse

## 2012-12-14 ENCOUNTER — Ambulatory Visit: Payer: Medicare Other

## 2012-12-20 ENCOUNTER — Ambulatory Visit: Payer: Medicare Other | Admitting: Cardiology

## 2013-01-03 ENCOUNTER — Other Ambulatory Visit: Payer: Self-pay | Admitting: Family Medicine

## 2013-01-03 NOTE — Telephone Encounter (Signed)
Please give her 3 mo of refils-thanks

## 2013-01-03 NOTE — Telephone Encounter (Signed)
No recent appt and no future appt, ok to refill? 

## 2013-01-04 ENCOUNTER — Other Ambulatory Visit: Payer: Self-pay | Admitting: *Deleted

## 2013-01-04 MED ORDER — OMEPRAZOLE 20 MG PO CPDR: DELAYED_RELEASE_CAPSULE | ORAL | Status: DC

## 2013-01-04 NOTE — Telephone Encounter (Signed)
done

## 2013-01-10 ENCOUNTER — Ambulatory Visit (INDEPENDENT_AMBULATORY_CARE_PROVIDER_SITE_OTHER): Payer: Medicare Other | Admitting: Cardiology

## 2013-01-10 ENCOUNTER — Encounter: Payer: Self-pay | Admitting: Cardiology

## 2013-01-10 VITALS — BP 92/58 | HR 49 | Ht 66.0 in | Wt 114.8 lb

## 2013-01-10 DIAGNOSIS — B351 Tinea unguium: Secondary | ICD-10-CM

## 2013-01-10 DIAGNOSIS — Z7901 Long term (current) use of anticoagulants: Secondary | ICD-10-CM

## 2013-01-10 DIAGNOSIS — E8770 Fluid overload, unspecified: Secondary | ICD-10-CM

## 2013-01-10 DIAGNOSIS — R0602 Shortness of breath: Secondary | ICD-10-CM

## 2013-01-10 DIAGNOSIS — I10 Essential (primary) hypertension: Secondary | ICD-10-CM

## 2013-01-10 DIAGNOSIS — E8779 Other fluid overload: Secondary | ICD-10-CM

## 2013-01-10 DIAGNOSIS — I4891 Unspecified atrial fibrillation: Secondary | ICD-10-CM

## 2013-01-10 LAB — BASIC METABOLIC PANEL
BUN: 23 mg/dL (ref 6–23)
Creatinine, Ser: 1.1 mg/dL (ref 0.4–1.2)
GFR: 50.2 mL/min — ABNORMAL LOW (ref >60.00)
Potassium: 4.7 mEq/L (ref 3.5–5.1)

## 2013-01-10 MED ORDER — TERBINAFINE HCL 250 MG PO TABS: 250.0000 mg | ORAL_TABLET | Freq: Every day | ORAL | Status: DC

## 2013-01-10 NOTE — Patient Instructions (Addendum)
Your physician has recommended you make the following change in your medication: start taking Lamisel 250 mg daily  Your physician recommends that you return for lab work in: today  Your physician wants you to follow-up in: 6 months. You will receive a reminder letter in the mail two months in advance. If you don't receive a letter, please call our office to schedule the follow-up appointment.

## 2013-01-10 NOTE — Assessment & Plan Note (Signed)
Her blood pressure is actually on the lower side today. She's not having any symptoms. I've chosen not to change any of her medications.

## 2013-01-10 NOTE — Assessment & Plan Note (Signed)
Coronary disease is stable. No further workup at this time. 

## 2013-01-10 NOTE — Assessment & Plan Note (Signed)
Her rate is controlled. I have switched her from diltiazem to metoprolol and she feels better with this.

## 2013-01-10 NOTE — Assessment & Plan Note (Signed)
She has chronic shortness of breath. But this is quite stable. No further workup.

## 2013-01-10 NOTE — Assessment & Plan Note (Signed)
She continues on Coumadin. 

## 2013-01-10 NOTE — Progress Notes (Signed)
HPI   The patient returns for followup of her overall cardiac status. She is doing very well. She looks great today. She's not having any chest pain. She does not feel any palpitations. She's not had any syncope or presyncope.  She does ask if there is a an antifungal medication she can take orally that we'll not have a major effect with her Coumadin.  Allergies  Allergen Reactions  . Alendronate Sodium     REACTION: GI  . Sulfonamide Derivatives     REACTION: sick    Current Outpatient Prescriptions  Medication Sig Dispense Refill  . acetaminophen (TYLENOL ARTHRITIS PAIN) 650 MG CR tablet Take 650 mg by mouth every 8 (eight) hours as needed.        . Calcium Carbonate-Vitamin D (CALCIUM 600/VITAMIN D) 600-400 MG-UNIT per tablet Take 2 tablets by mouth daily.        . cyanocobalamin (,VITAMIN B-12,) 1000 MCG/ML injection Inject 1,000 mcg into the muscle every 30 (thirty) days.       . ferrous sulfate 325 (65 FE) MG tablet TAKE 1 TABLET EVERY DAY  90 tablet  0  . fluticasone (FLONASE) 50 MCG/ACT nasal spray Place 2 sprays into the nose daily.  16 g  1  . furosemide (LASIX) 20 MG tablet TAKE 2 TABLETS (40 MG TOTAL) BY MOUTH DAILY.  180 tablet  1  . glucosamine-chondroitin 500-400 MG tablet Take 1 tablet by mouth daily.        [name] KLOR-CON M10 10 MEQ tablet Take 10 mEq by mouth daily.       [name] levothyroxine (SYNTHROID, LEVOTHROID) 88 MCG tablet TAKE 1 TABLET (88 MCG TOTAL) BY MOUTH DAILY. **NEEDS OFFICE VISIT**  90 tablet  0  . loratadine ([employer] ALLERGY RELIEF) 10 MG tablet Take 10 mg by mouth daily.        . Lutein 6 MG CAPS Take 1 capsule by mouth daily.        . metoprolol succinate (TOPROL-XL) 50 MG 24 hr tablet Take 1 tablet (50 mg total) by mouth daily. Take with or immediately following a meal.  90 tablet  3  . Multiple Vitamin (MULTIVITAMIN) tablet Take 1 tablet by mouth daily.        . mupirocin (BACTROBAN) 2 % ointment Apply topically 2 (two) times daily.        . nitroGLYCERIN  (NITROSTAT) 0.4 MG SL tablet Place 0.4 mg under the tongue every 5 (five) minutes as needed.        [name] omeprazole (PRILOSEC) 20 MG capsule TAKE 1 CAPSULE TWICE A DAY * Needs appointment with Dr for additional refills*  180 capsule  0  . potassium chloride (K-DUR) 10 MEQ tablet Take 1 tablet (10 mEq total) by mouth daily.  90 tablet  3  . Propylene Glycol (SYSTANE BALANCE) 0.6 % SOLN Apply to eye daily.        . simvastatin (ZOCOR) 20 MG tablet Take 1/4 tablet by mouth daily at bedtime.  45 tablet  11  . warfarin (COUMADIN) 1 MG tablet Take 1 tablet (1 mg total) by mouth as directed.  90 tablet  1  . warfarin (COUMADIN) 5 MG tablet Take 2.5 mg by mouth daily. Take one by mouth daily.      [name] warfarin (COUMADIN) 5 MG tablet TAKE BY MOUTH AS DIRECTED  90 tablet  0   No current facility-administered medications for this visit.    History   Social History  . Marital  Status: Widowed    Spouse Name: N/A    Number of Children: N/A  . Years of Education: N/A   Occupational History  . Not on file.   Social History Main Topics  . Smoking status: Former Smoker    Quit date: 09/14/1947  . Smokeless tobacco: Not on file  . Alcohol Use: No  . Drug Use: Not on file  . Sexually Active: Not Currently   Other Topics Concern  . Not on file   Social History Narrative   Very independent    Lives in [place] by herself           Family History  Problem Relation Age of Onset  . Heart disease Mother 69  . Stroke Father 45  . Heart disease Sister   . Cancer Brother     colon  . Diabetes Brother   . Leukemia Sister     Past Medical History  Diagnosis Date  . A-fib     chronic  /  holter 06/2010, no brady, mild tachy  . Tachycardia     holter, 06/2010, no brady, mild  tachy  . CAD (coronary artery disease)     2 DES, [facility], 2004  . Pleural effusion associated with pulmonary infection 03-2007  . Hypothyroid   . Hyperlipidemia   . GERD (gastroesophageal reflux disease)   . Diverticulosis  of colon   . Allergy   . Anemia   . Arthritis     osteo  . Hypertension   . COPD (chronic obstructive pulmonary disease)   . Mitral regurgitation     moderate, echo, 06/2010  . Vitamin B deficiency   . Compression fracture of lumbar vertebra 05-2008  . Osteoporosis   . Thrombocytopenia   . SOB (shortness of breath)   . OA (osteoarthritis)   . LBBB (left bundle branch block)   . Hx of colonoscopy   . Warfarin anticoagulation   . Ejection fraction     50% echo, 2008  /  EF  35-40%, echo, 06/2010  . TR (tricuspid regurgitation)     moderate, echo 2008  . Dyslipidemia   . Lung abnormality     Dr Shelle Iron 122/2011 no further w/u needed  . Prolonged QT interval     when on sotolol in past  . Volume overload     intermittant  . Hypertension   . Weight loss     August, 2012  . Preop cardiovascular exam     Preop clearance for kyphoplasty  . Nausea     Some nausea with medications, May, 2013,    Past Surgical History  Procedure Laterality Date  . Thyroidectomy  1962  . Tonsillectomy      Patient Active Problem List   Diagnosis Date Noted  . Weight loss     Priority: High  . A-fib     Priority: High  . Tachycardia     Priority: High  . CAD (coronary artery disease)     Priority: High  . Mitral regurgitation     Priority: High  . SOB (shortness of breath)     Priority: High  . LBBB (left bundle branch block)     Priority: High  . Warfarin anticoagulation     Priority: High  . Ejection fraction     Priority: High  . TR (tricuspid regurgitation)     Priority: High  . Dyslipidemia     Priority: High  . Prolonged QT interval  Priority: High  . Volume overload     Priority: High  . Productive cough 02/29/2012  . Nausea   . Preop cardiovascular exam   . Left shoulder pain 10/06/2011  . Lumbar pain 10/06/2011  . Hyperlipidemia 07/09/2011  . Vision blurred 07/09/2011  . Hearing loss 07/09/2011  . Lung abnormality   . Coccydynia 12/03/2010  . Nonspecific  (abnormal) findings on radiological and other examination of body structure 07/13/2010  . ABNORMAL CHEST XRAY 07/13/2010  . SHORTNESS OF BREATH 06/18/2010  . HYPERTENSION 06/24/2009  . GOUT, UNSPECIFIED 03/20/2009  . GERD 03/18/2009  . THROMBOCYTOPENIA 01/03/2009  . ALLERGIC RHINITIS 12/05/2008  . HOARSENESS 06/07/2008  . MEDIASTINAL LYMPHADENOPATHY 05/13/2008  . Other Diseases of Lung, not Elsewhere Classified 04/18/2008  . VENOUS INSUFFICIENCY 09/13/2007  . FREQUENCY, URINARY 07/19/2007  . VERTIGO, BENIGN PAROXYSMAL POSITION 05/11/2007  . TINNITUS NOS 05/11/2007  . OSTEOPOROSIS NOS 05/11/2007  . HYPOTHYROIDISM 04/11/2007  . ANEMIA, B12 DEFICIENCY 04/11/2007  . HYPOXEMIA 04/11/2007    ROS   Patient denies fever, chills, headache, sweats, rash, change in vision, change in hearing, chest pain, cough, nausea vomiting, urinary symptoms. All other systems are reviewed and are negative.  PHYSICAL EXAM  Patient looks great. She is oriented to person time and place. Affect is normal. She's here with her niece today who is 64 years of age. She is usually with her daughter. Unfortunately her daughter has some other illness in the family that she is attending to today. She is thin but stable. There is no jugular venous distention. She has kyphosis of the thoracic spine. Lungs are clear. Respiratory effort is nonlabored. Cardiac exam reveals that the rhythm is irregularly irregular. The abdomen is soft. There is no peripheral edema.  Filed Vitals:   01/10/13 1022  BP: 92/58  Pulse: 49  Height: 5\' 6"  (1.676 m)  Weight: 114 lb 12.8 oz (52.073 kg)  SpO2: 97%    ASSESSMENT & PLAN

## 2013-01-10 NOTE — Assessment & Plan Note (Signed)
She is on diuretic. Chemistry level be checked today.

## 2013-01-10 NOTE — Assessment & Plan Note (Signed)
She says that the topical preparations do not work. We have spoken with the Coumadin clinic and they recommend that we give her oral Lamisil and they will keep this in mind as a follow her INR.

## 2013-01-11 ENCOUNTER — Ambulatory Visit (INDEPENDENT_AMBULATORY_CARE_PROVIDER_SITE_OTHER): Payer: Medicare Other | Admitting: General Practice

## 2013-01-11 DIAGNOSIS — Z7901 Long term (current) use of anticoagulants: Secondary | ICD-10-CM

## 2013-01-11 DIAGNOSIS — Z5181 Encounter for therapeutic drug level monitoring: Secondary | ICD-10-CM

## 2013-01-11 DIAGNOSIS — I2510 Atherosclerotic heart disease of native coronary artery without angina pectoris: Secondary | ICD-10-CM

## 2013-01-11 LAB — POCT INR: INR: 3

## 2013-02-03 ENCOUNTER — Other Ambulatory Visit: Payer: Self-pay | Admitting: Family Medicine

## 2013-02-06 ENCOUNTER — Other Ambulatory Visit: Payer: Self-pay | Admitting: General Practice

## 2013-02-06 ENCOUNTER — Ambulatory Visit (INDEPENDENT_AMBULATORY_CARE_PROVIDER_SITE_OTHER): Payer: Medicare Other | Admitting: General Practice

## 2013-02-06 ENCOUNTER — Ambulatory Visit (INDEPENDENT_AMBULATORY_CARE_PROVIDER_SITE_OTHER): Payer: Medicare Other

## 2013-02-06 ENCOUNTER — Other Ambulatory Visit (INDEPENDENT_AMBULATORY_CARE_PROVIDER_SITE_OTHER): Payer: Medicare Other

## 2013-02-06 DIAGNOSIS — I4891 Unspecified atrial fibrillation: Secondary | ICD-10-CM

## 2013-02-06 MED ORDER — WARFARIN SODIUM 5 MG PO TABS: ORAL_TABLET | ORAL | Status: DC

## 2013-02-06 NOTE — Telephone Encounter (Signed)
Electronic refill request, routing to [place]

## 2013-02-08 ENCOUNTER — Ambulatory Visit: Payer: Medicare Other

## 2013-02-22 ENCOUNTER — Telehealth: Payer: Self-pay

## 2013-02-22 NOTE — Telephone Encounter (Signed)
[name], pts daughter left v/m requesting call back from Dr Milinda Antis on 02/23/13; I left v/m for Jamesetta So to cb to get more detailed message.

## 2013-02-23 NOTE — Telephone Encounter (Signed)
Pt wanted to know if it was okay for pt to be taking the Lyrica and Acyclovir, pt also wanted you to know her mom is really weak and she isn't sure if it's because of the Shingles but she is very worried since she just lost her husband she is afraid of losing her mom, If you have time after seeing pt she would like a call back

## 2013-02-23 NOTE — Telephone Encounter (Signed)
[name] said pt has shingles on left side under breast and goes to the back for 1 week,  Pt is scheduled to see Dr Milinda Antis on 02/28/13.Pt is starting Lyrica on 02/23/13; pt had been taking Acyclovir 800 mg. Pt request call back from Dr Milinda Antis to give more info prior to pt's appt.

## 2013-02-23 NOTE — Telephone Encounter (Signed)
I spoke to Jamesetta So (her daughter) and she has not started the lyrica yet- seems to be doing ok pain wise with tylenol- I instructed her to stick with the tylenol for pain control if she does not need more than that because lyrica can cause sedation and falls - and if she does need it she needs to stay with her to watch her  They will f/u with me next week as planned and call earlier if needed

## 2013-02-23 NOTE — Telephone Encounter (Signed)
Since I am with pts- please call her back to ask what else I may need to know, thanks

## 2013-02-27 ENCOUNTER — Telehealth: Payer: Self-pay | Admitting: Cardiology

## 2013-02-27 ENCOUNTER — Telehealth: Payer: Self-pay | Admitting: Family Medicine

## 2013-02-27 ENCOUNTER — Emergency Department (HOSPITAL_COMMUNITY): Payer: Medicare Other

## 2013-02-27 ENCOUNTER — Encounter (HOSPITAL_COMMUNITY): Payer: Self-pay | Admitting: Cardiology

## 2013-02-27 ENCOUNTER — Inpatient Hospital Stay (HOSPITAL_COMMUNITY)
Admission: EM | Admit: 2013-02-27 | Discharge: 2013-03-03 | DRG: 682 | Disposition: A | Discharge status: Home / Self Care | Payer: Medicare Other | Attending: Internal Medicine | Admitting: Internal Medicine

## 2013-02-27 DIAGNOSIS — E785 Hyperlipidemia, unspecified: Secondary | ICD-10-CM | POA: Diagnosis present

## 2013-02-27 DIAGNOSIS — J984 Other disorders of lung: Secondary | ICD-10-CM

## 2013-02-27 DIAGNOSIS — J449 Chronic obstructive pulmonary disease, unspecified: Secondary | ICD-10-CM | POA: Diagnosis present

## 2013-02-27 DIAGNOSIS — M1990 Unspecified osteoarthritis, unspecified site: Secondary | ICD-10-CM | POA: Diagnosis present

## 2013-02-27 DIAGNOSIS — E43 Unspecified severe protein-calorie malnutrition: Secondary | ICD-10-CM

## 2013-02-27 DIAGNOSIS — R498 Other voice and resonance disorders: Secondary | ICD-10-CM

## 2013-02-27 DIAGNOSIS — R0902 Hypoxemia: Secondary | ICD-10-CM

## 2013-02-27 DIAGNOSIS — E860 Dehydration: Secondary | ICD-10-CM | POA: Diagnosis present

## 2013-02-27 DIAGNOSIS — M810 Age-related osteoporosis without current pathological fracture: Secondary | ICD-10-CM | POA: Diagnosis present

## 2013-02-27 DIAGNOSIS — I340 Nonrheumatic mitral (valve) insufficiency: Secondary | ICD-10-CM

## 2013-02-27 DIAGNOSIS — H8110 Benign paroxysmal vertigo, unspecified ear: Secondary | ICD-10-CM

## 2013-02-27 DIAGNOSIS — B351 Tinea unguium: Secondary | ICD-10-CM

## 2013-02-27 DIAGNOSIS — I071 Rheumatic tricuspid insufficiency: Secondary | ICD-10-CM

## 2013-02-27 DIAGNOSIS — R197 Diarrhea, unspecified: Secondary | ICD-10-CM | POA: Diagnosis present

## 2013-02-27 DIAGNOSIS — N189 Chronic kidney disease, unspecified: Secondary | ICD-10-CM | POA: Diagnosis present

## 2013-02-27 DIAGNOSIS — J4489 Other specified chronic obstructive pulmonary disease: Secondary | ICD-10-CM | POA: Diagnosis present

## 2013-02-27 DIAGNOSIS — K219 Gastro-esophageal reflux disease without esophagitis: Secondary | ICD-10-CM | POA: Diagnosis present

## 2013-02-27 DIAGNOSIS — Z87891 Personal history of nicotine dependence: Secondary | ICD-10-CM

## 2013-02-27 DIAGNOSIS — H9319 Tinnitus, unspecified ear: Secondary | ICD-10-CM

## 2013-02-27 DIAGNOSIS — R000 Tachycardia, unspecified: Secondary | ICD-10-CM

## 2013-02-27 DIAGNOSIS — N179 Acute kidney failure, unspecified: Principal | ICD-10-CM | POA: Diagnosis present

## 2013-02-27 DIAGNOSIS — R350 Frequency of micturition: Secondary | ICD-10-CM

## 2013-02-27 DIAGNOSIS — I4891 Unspecified atrial fibrillation: Secondary | ICD-10-CM | POA: Diagnosis present

## 2013-02-27 DIAGNOSIS — R9430 Abnormal result of cardiovascular function study, unspecified: Secondary | ICD-10-CM

## 2013-02-27 DIAGNOSIS — Z8619 Personal history of other infectious and parasitic diseases: Secondary | ICD-10-CM | POA: Insufficient documentation

## 2013-02-27 DIAGNOSIS — R110 Nausea: Secondary | ICD-10-CM

## 2013-02-27 DIAGNOSIS — I5023 Acute on chronic systolic (congestive) heart failure: Secondary | ICD-10-CM

## 2013-02-27 DIAGNOSIS — Z7901 Long term (current) use of anticoagulants: Secondary | ICD-10-CM

## 2013-02-27 DIAGNOSIS — D696 Thrombocytopenia, unspecified: Secondary | ICD-10-CM

## 2013-02-27 DIAGNOSIS — I2510 Atherosclerotic heart disease of native coronary artery without angina pectoris: Secondary | ICD-10-CM | POA: Diagnosis present

## 2013-02-27 DIAGNOSIS — I872 Venous insufficiency (chronic) (peripheral): Secondary | ICD-10-CM

## 2013-02-27 DIAGNOSIS — R930 Abnormal findings on diagnostic imaging of skull and head, not elsewhere classified: Secondary | ICD-10-CM

## 2013-02-27 DIAGNOSIS — R634 Abnormal weight loss: Secondary | ICD-10-CM

## 2013-02-27 DIAGNOSIS — R0602 Shortness of breath: Secondary | ICD-10-CM | POA: Diagnosis present

## 2013-02-27 DIAGNOSIS — I509 Heart failure, unspecified: Secondary | ICD-10-CM | POA: Diagnosis present

## 2013-02-27 DIAGNOSIS — E039 Hypothyroidism, unspecified: Secondary | ICD-10-CM | POA: Diagnosis present

## 2013-02-27 DIAGNOSIS — Z8249 Family history of ischemic heart disease and other diseases of the circulatory system: Secondary | ICD-10-CM

## 2013-02-27 DIAGNOSIS — B029 Zoster without complications: Secondary | ICD-10-CM | POA: Diagnosis present

## 2013-02-27 DIAGNOSIS — H538 Other visual disturbances: Secondary | ICD-10-CM

## 2013-02-27 DIAGNOSIS — I10 Essential (primary) hypertension: Secondary | ICD-10-CM

## 2013-02-27 DIAGNOSIS — R9431 Abnormal electrocardiogram [ECG] [EKG]: Secondary | ICD-10-CM

## 2013-02-27 DIAGNOSIS — Z01810 Encounter for preprocedural cardiovascular examination: Secondary | ICD-10-CM

## 2013-02-27 DIAGNOSIS — I059 Rheumatic mitral valve disease, unspecified: Secondary | ICD-10-CM | POA: Diagnosis present

## 2013-02-27 DIAGNOSIS — R5381 Other malaise: Secondary | ICD-10-CM | POA: Diagnosis present

## 2013-02-27 DIAGNOSIS — J309 Allergic rhinitis, unspecified: Secondary | ICD-10-CM

## 2013-02-27 DIAGNOSIS — M545 Low back pain: Secondary | ICD-10-CM

## 2013-02-27 DIAGNOSIS — Z806 Family history of leukemia: Secondary | ICD-10-CM

## 2013-02-27 DIAGNOSIS — Z823 Family history of stroke: Secondary | ICD-10-CM

## 2013-02-27 DIAGNOSIS — M25512 Pain in left shoulder: Secondary | ICD-10-CM

## 2013-02-27 DIAGNOSIS — M533 Sacrococcygeal disorders, not elsewhere classified: Secondary | ICD-10-CM

## 2013-02-27 DIAGNOSIS — R05 Cough: Secondary | ICD-10-CM

## 2013-02-27 DIAGNOSIS — I129 Hypertensive chronic kidney disease with stage 1 through stage 4 chronic kidney disease, or unspecified chronic kidney disease: Secondary | ICD-10-CM | POA: Diagnosis present

## 2013-02-27 DIAGNOSIS — K5730 Diverticulosis of large intestine without perforation or abscess without bleeding: Secondary | ICD-10-CM | POA: Diagnosis present

## 2013-02-27 DIAGNOSIS — E8770 Fluid overload, unspecified: Secondary | ICD-10-CM

## 2013-02-27 DIAGNOSIS — I5043 Acute on chronic combined systolic (congestive) and diastolic (congestive) heart failure: Secondary | ICD-10-CM

## 2013-02-27 DIAGNOSIS — D518 Other vitamin B12 deficiency anemias: Secondary | ICD-10-CM

## 2013-02-27 DIAGNOSIS — I447 Left bundle-branch block, unspecified: Secondary | ICD-10-CM

## 2013-02-27 DIAGNOSIS — Z833 Family history of diabetes mellitus: Secondary | ICD-10-CM

## 2013-02-27 DIAGNOSIS — Z79899 Other long term (current) drug therapy: Secondary | ICD-10-CM

## 2013-02-27 DIAGNOSIS — M109 Gout, unspecified: Secondary | ICD-10-CM

## 2013-02-27 DIAGNOSIS — R599 Enlarged lymph nodes, unspecified: Secondary | ICD-10-CM

## 2013-02-27 LAB — PROTIME-INR
INR: 2.19 — ABNORMAL HIGH (ref 0.00–1.49)
Prothrombin Time: 23.4 seconds — ABNORMAL HIGH (ref 11.6–15.2)

## 2013-02-27 LAB — CBC
Hemoglobin: 12.2 g/dL (ref 12.0–15.0)
MCH: 31.9 pg (ref 26.0–34.0)
MCHC: 33.4 g/dL (ref 30.0–36.0)
Platelets: 169 10*3/uL (ref 150–400)
RDW: 13.7 % (ref 11.5–15.5)

## 2013-02-27 LAB — BASIC METABOLIC PANEL
Calcium: 9.3 mg/dL (ref 8.4–10.5)
GFR calc Af Amer: 31 mL/min — ABNORMAL LOW (ref >90)
GFR calc non Af Amer: 27 mL/min — ABNORMAL LOW (ref >90)
Glucose, Bld: 87 mg/dL (ref 70–99)
Potassium: 4.8 mEq/L (ref 3.5–5.1)
Sodium: 139 mEq/L (ref 135–145)

## 2013-02-27 MED ORDER — FUROSEMIDE 10 MG/ML IJ SOLN
20.0000 mg | Freq: Once | INTRAMUSCULAR | Status: AC
  Administered 2013-02-27: 20 mg via INTRAVENOUS
  Filled 2013-02-27: qty 2

## 2013-02-27 MED ORDER — FUROSEMIDE 10 MG/ML IJ SOLN: 40.0000 mg | Freq: Once | INTRAMUSCULAR | Status: DC

## 2013-02-27 NOTE — Telephone Encounter (Signed)
Archie Patten called to ask if she should take patient to the hospital or wait until her appointment with Dr.Tower on 02/28/13.  She thinks that patient is dehydrated and she's very weak.  Patient has,also,had shingles for the past week.  I asked Dr.Tower and she said if they think patient is dehydrated she'll need to go to the hospital for fluids.  I notified Tonya and she said she'll take patient to [facility].  Archie Patten will keep the appointment with Dr.Tower tomorrow unless patient is admitted to the hospital.

## 2013-02-27 NOTE — ED Provider Notes (Signed)
History     CSN: [id number]  Arrival date & time 02/27/13  4098   First MD Initiated Contact with Patient 02/27/13 1932      Chief Complaint  Patient presents with  . Palpitations    (Consider location/radiation/quality/duration/timing/severity/associated sxs/prior treatment) Patient is a 79 y.o. female presenting with shortness of breath.  Shortness of Breath Severity:  Moderate Onset quality:  Gradual Duration:  2 weeks Timing:  Constant Progression:  Worsening Chronicity:  Recurrent Context: activity   Context: not URI   Context comment:  Recent shingles outbreak, sp medical therapy with continuing symptoms Relieved by:  Nothing Worsened by:  Nothing tried Ineffective treatments:  None tried Associated symptoms: chest pain (chest wall pain from shingles outbreak, nonexertional, nonpleuritic, exacerbated by touch)   Associated symptoms: no abdominal pain, no claudication, no cough, no fever, no rash, no sore throat, no sputum production, no syncope and no vomiting   Risk factors comment:  CAD   Past Medical History  Diagnosis Date  . A-fib     chronic  /  holter 06/2010, no brady, mild tachy  . Tachycardia     holter, 06/2010, no brady, mild  tachy  . CAD (coronary artery disease)     2 DES, [facility], 2004  . Pleural effusion associated with pulmonary infection 03-2007  . Hypothyroid   . Hyperlipidemia   . GERD (gastroesophageal reflux disease)   . Diverticulosis of colon   . Allergy   . Anemia   . Arthritis     osteo  . Hypertension   . COPD (chronic obstructive pulmonary disease)   . Mitral regurgitation     moderate, echo, 06/2010  . Vitamin B deficiency   . Compression fracture of lumbar vertebra 05-2008  . Osteoporosis   . Thrombocytopenia   . SOB (shortness of breath)   . OA (osteoarthritis)   . LBBB (left bundle branch block)   . Hx of colonoscopy   . Warfarin anticoagulation   . Ejection fraction     50% echo, 2008  /  EF  35-40%, echo, 06/2010  .  TR (tricuspid regurgitation)     moderate, echo 2008  . Dyslipidemia   . Lung abnormality     Dr Shelle Iron 122/2011 no further w/u needed  . Prolonged QT interval     when on sotolol in past  . Volume overload     intermittant  . Hypertension   . Weight loss     August, 2012  . Preop cardiovascular exam     Preop clearance for kyphoplasty  . Nausea     Some nausea with medications, May, 2013,  . CHF (congestive heart failure)     Past Surgical History  Procedure Laterality Date  . Thyroidectomy  1962  . Tonsillectomy      Family History  Problem Relation Age of Onset  . Heart disease Mother 23  . Stroke Father 60  . Heart disease Sister   . Cancer Brother     colon  . Diabetes Brother   . Leukemia Sister     History  Substance Use Topics  . Smoking status: Former Smoker    Quit date: 09/14/1947  . Smokeless tobacco: Not on file  . Alcohol Use: No    OB History   Grav Para Term Preterm Abortions TAB SAB Ect Mult Living                  Review of Systems  Constitutional: Negative for  fever and chills.  HENT: Negative for congestion, sore throat and rhinorrhea.   Eyes: Negative for photophobia and visual disturbance.  Respiratory: Positive for shortness of breath. Negative for cough and sputum production.   Cardiovascular: Positive for chest pain (chest wall pain from shingles outbreak, nonexertional, nonpleuritic, exacerbated by touch). Negative for claudication, leg swelling and syncope.  Gastrointestinal: Negative for nausea, vomiting, abdominal pain, diarrhea and constipation.  Endocrine: Negative for polyphagia and polyuria.  Genitourinary: Negative for dysuria, flank pain, vaginal bleeding, vaginal discharge and enuresis.  Musculoskeletal: Negative for back pain and gait problem.  Skin: Negative for color change and rash.  Neurological: Negative for dizziness, syncope, light-headedness and numbness.  Hematological: Negative for adenopathy. Does not  bruise/bleed easily.  All other systems reviewed and are negative.    Allergies  Alendronate sodium and Sulfonamide derivatives  Home Medications   Current Outpatient Rx  Name  Route  Sig  Dispense  Refill  . acetaminophen (TYLENOL ARTHRITIS PAIN) 650 MG CR tablet   Oral   Take 650 mg by mouth every 8 (eight) hours as needed for pain.          . Calcium Carbonate-Vitamin D (CALCIUM 600/VITAMIN D) 600-400 MG-UNIT per tablet   Oral   Take 2 tablets by mouth daily.           . cyanocobalamin (,VITAMIN B-12,) 1000 MCG/ML injection   Intramuscular   Inject 1,000 mcg into the muscle every 30 (thirty) days.          [name] diltiazem (DILACOR XR) 120 MG 24 hr capsule   Oral   Take 120 mg by mouth daily.         . ferrous sulfate 325 (65 FE) MG tablet      TAKE 1 TABLET EVERY DAY   90 tablet   0   . furosemide (LASIX) 20 MG tablet      TAKE 2 TABLETS (40 MG TOTAL) BY MOUTH DAILY.   180 tablet   1   . glucosamine-chondroitin 500-400 MG tablet   Oral   Take 1 tablet by mouth daily.           [name] levothyroxine (SYNTHROID, LEVOTHROID) 88 MCG tablet      TAKE 1 TABLET (88 MCG TOTAL) BY MOUTH DAILY. **NEEDS OFFICE VISIT**   90 tablet   0   . Lutein 6 MG CAPS   Oral   Take 1 capsule by mouth daily.           . metoprolol succinate (TOPROL-XL) 50 MG 24 hr tablet   Oral   Take 1 tablet (50 mg total) by mouth daily. Take with or immediately following a meal.   90 tablet   3   . Multiple Vitamin (MULTIVITAMIN) tablet   Oral   Take 1 tablet by mouth daily.          . nitroGLYCERIN (NITROSTAT) 0.4 MG SL tablet   Sublingual   Place 0.4 mg under the tongue every 5 (five) minutes as needed for chest pain.          [name] omeprazole (PRILOSEC) 20 MG capsule      TAKE 1 CAPSULE TWICE A DAY * Needs appointment with Dr for additional refills*   180 capsule   0   . potassium chloride (K-DUR) 10 MEQ tablet   Oral   Take 1 tablet (10 mEq total) by mouth daily.   90  tablet   3   . simvastatin (ZOCOR) 20  MG tablet   Oral   Take 5 mg by mouth every evening.         . vitamin B-12 (CYANOCOBALAMIN) 1000 MCG tablet   Oral   Take 1,000 mcg by mouth daily.         [name] warfarin (COUMADIN) 5 MG tablet   Oral   Take 5 mg by mouth daily.           BP 115/58  Pulse 78  Temp(Src) 98.3 F (36.8 C) (Oral)  Resp 20  SpO2 100%  Physical Exam  Vitals reviewed. Constitutional: She is oriented to person, place, and time. She appears well-developed and well-nourished.  HENT:  Head: Normocephalic and atraumatic.  Right Ear: External ear normal.  Left Ear: External ear normal.  Eyes: Conjunctivae and EOM are normal. Pupils are equal, round, and reactive to light.  Neck: Normal range of motion. Neck supple.  Cardiovascular: Normal rate, regular rhythm, normal heart sounds and intact distal pulses.   Pulmonary/Chest: Effort normal and breath sounds normal.   She exhibits tenderness (at site of zoster outbreak).    Abdominal: Soft. Bowel sounds are normal. There is no tenderness.  Musculoskeletal: Normal range of motion.  Neurological: She is alert and oriented to person, place, and time.  Skin: Skin is warm and dry.    ED Course  Procedures (including critical care time)  Labs Reviewed  CBC - Abnormal; Notable for the following:    RBC 3.83 (*)    All other components within normal limits  BASIC METABOLIC PANEL - Abnormal; Notable for the following:    BUN 38 (*)    Creatinine, Ser 1.62 (*)    GFR calc non Af Amer 27 (*)    GFR calc Af Amer 31 (*)    All other components within normal limits  PRO B NATRIURETIC PEPTIDE - Abnormal; Notable for the following:    Pro B Natriuretic peptide (BNP) 4402.0 (*)    All other components within normal limits  PROTIME-INR - Abnormal; Notable for the following:    Prothrombin Time 23.4 (*)    INR 2.19 (*)    All other components within normal limits  POCT I-STAT TROPONIN I   Dg Chest 2  View  02/27/2013   *RADIOLOGY REPORT*  Clinical Data: Palpitations  CHEST - 2 VIEW  Comparison: Chest radiograph 02/29/2012  Findings: Stable moderate cardiomegaly. Coronary artery stents are visible projecting over the left heart.  Pulmonary vascularity is normal.  Probable atherosclerotic calcification of the right subclavian artery projecting over the right lung apex, when correlated with prior chest CT.  There is biapical pleuroparenchymal scarring, right greater than left, stable.  The lungs are mildly hyperinflated.  No airspace disease, effusion, or pneumothorax.  Diffuse osteopenia of the bones.  Vertebroplasty changes seen at one level in the lower thoracic spine.  IMPRESSION: Stable cardiomegaly.  Coronary artery stents.  No acute cardiopulmonary disease.   Original Report Authenticated By: Britta Mccreedy, M.D.    Results for orders placed during the hospital encounter of 02/27/13  CBC      Result Value Range   WBC 7.1  4.0 - 10.5 K/uL   RBC 3.83 (*) 3.87 - 5.11 MIL/uL   Hemoglobin 12.2  12.0 - 15.0 g/dL   HCT 16.1  09.6 - 04.5 %   MCV 95.3  78.0 - 100.0 fL   MCH 31.9  26.0 - 34.0 pg   MCHC 33.4  30.0 - 36.0 g/dL   RDW 13.7  11.5 - 15.5 %   Platelets 169  150 - 400 K/uL  BASIC METABOLIC PANEL      Result Value Range   Sodium 139  135 - 145 mEq/L   Potassium 4.8  3.5 - 5.1 mEq/L   Chloride 103  96 - 112 mEq/L   CO2 27  19 - 32 mEq/L   Glucose, Bld 87  70 - 99 mg/dL   BUN 38 (*) 6 - 23 mg/dL   Creatinine, Ser 1.61 (*) 0.50 - 1.10 mg/dL   Calcium 9.3  8.4 - 09.6 mg/dL   GFR calc non Af Amer 27 (*) >90 mL/min   GFR calc Af Amer 31 (*) >90 mL/min  PRO B NATRIURETIC PEPTIDE      Result Value Range   Pro B Natriuretic peptide (BNP) 4402.0 (*) 0 - 450 pg/mL  PROTIME-INR      Result Value Range   Prothrombin Time 23.4 (*) 11.6 - 15.2 seconds   INR 2.19 (*) 0.00 - 1.49  POCT I-STAT TROPONIN I      Result Value Range   Troponin i, poc 0.02  0.00 - 0.08 ng/mL   Comment 3               1. CHF (congestive heart failure), acute on chronic, systolic      Date: 02/27/2013  Rate: 77  Rhythm: atrial fibrillation  QRS Axis: left  Intervals: qt normal  ST/T Wave abnormalities: normal  Conduction Disutrbances:none  Narrative Interpretation:   Old EKG Reviewed: No significant change    MDM   79 y.o. female  with pertinent PMH of CAD, COPD, CHF, afib presents with exertional dyspnea x 2 weeks.  Pt had antecedent GI symptoms 4 weeks ago, however this resolved 2 weeks ago.  Around same time, pt developed shingles, which are unresolved after 2 weeks.  She has had no chest pain otherwise, no exertional component, however she lives alone and is progressively dyspneic.  Vitals and physical exam as above. Labs and physical exam consistent with CHF exacerbation, mild pt states she is compliant with therapy and diet.  Consulted hospitalist for admission for gentle diuresis, serial troponin.  No change in condition.  Given lasix 20mg .  Pressures stable.  Labs and imaging as above reviewed by myself and attending,Dr. Bebe Shaggy, with whom case was discussed.   1. CHF (congestive heart failure), acute on chronic, systolic             Noel Gerold, MD 02/27/13 2330

## 2013-02-27 NOTE — ED Notes (Signed)
Her PCP called to report that pt. Has shingles,  And the family reported to the PCP that pt. Is dehydrated and has a rapid heartrate

## 2013-02-27 NOTE — ED Notes (Signed)
Pt reports that she started having palpations yesterday. Reports that she called her cardiologist this afternoon and was told to come in for evaluation. Reports chest pain but also has a hx of shingles. States that she took 7 days of medication for the shingles but does not seem any better. Denies any n/v. Reports hx of 2 stents.

## 2013-02-27 NOTE — Telephone Encounter (Signed)
New Problem:    Patient's daughter called in because the patient is feeling ill fatigue, shingles and rapid heart beat and is dehydrated.  Will take her to the ER but would like to know if you would be able to have her seen sooner that she would just sitting there normally.  Please call back.

## 2013-02-27 NOTE — ED Notes (Signed)
Patient provided with a [place] sandwich and a cup of water.

## 2013-02-27 NOTE — ED Notes (Signed)
Pt st's while in bed last pm she could feel her heart racing.  When ask if she had any chest pain, family st's she has been in a lot of pain with the shingles and did not know if pain was coming from chest or shingles.  Pt alert and oriented x's 3, skin warm and dry color appropriate.  Pt has completed 7 day course of Zovirax.

## 2013-02-27 NOTE — Telephone Encounter (Signed)
Pts daughter asked that I call [facility] to inform them that the pt is on her way there to be treated for shingles, dehydration and fatigue. Lawson Fiscal, nurse at ED made aware.

## 2013-02-27 NOTE — Telephone Encounter (Signed)
Aware. thanks

## 2013-02-28 ENCOUNTER — Encounter (HOSPITAL_COMMUNITY): Payer: Self-pay | Admitting: Internal Medicine

## 2013-02-28 ENCOUNTER — Ambulatory Visit: Payer: Medicare Other | Admitting: Family Medicine

## 2013-02-28 DIAGNOSIS — I369 Nonrheumatic tricuspid valve disorder, unspecified: Secondary | ICD-10-CM

## 2013-02-28 DIAGNOSIS — R0602 Shortness of breath: Secondary | ICD-10-CM

## 2013-02-28 DIAGNOSIS — Z8619 Personal history of other infectious and parasitic diseases: Secondary | ICD-10-CM | POA: Insufficient documentation

## 2013-02-28 DIAGNOSIS — I5042 Chronic combined systolic (congestive) and diastolic (congestive) heart failure: Secondary | ICD-10-CM

## 2013-02-28 DIAGNOSIS — I509 Heart failure, unspecified: Secondary | ICD-10-CM

## 2013-02-28 DIAGNOSIS — B029 Zoster without complications: Secondary | ICD-10-CM | POA: Diagnosis present

## 2013-02-28 DIAGNOSIS — E860 Dehydration: Secondary | ICD-10-CM | POA: Diagnosis present

## 2013-02-28 DIAGNOSIS — I4891 Unspecified atrial fibrillation: Secondary | ICD-10-CM

## 2013-02-28 DIAGNOSIS — N179 Acute kidney failure, unspecified: Secondary | ICD-10-CM | POA: Diagnosis present

## 2013-02-28 LAB — COMPREHENSIVE METABOLIC PANEL
ALT: 10 U/L (ref 0–35)
AST: 20 U/L (ref 0–37)
Albumin: 3.4 g/dL — ABNORMAL LOW (ref 3.5–5.2)
Alkaline Phosphatase: 44 U/L (ref 39–117)
Chloride: 103 mEq/L (ref 96–112)
Potassium: 4.1 mEq/L (ref 3.5–5.1)
Sodium: 140 mEq/L (ref 135–145)
Total Bilirubin: 0.4 mg/dL (ref 0.3–1.2)

## 2013-02-28 LAB — URINALYSIS, ROUTINE W REFLEX MICROSCOPIC
Bilirubin Urine: NEGATIVE
Ketones, ur: NEGATIVE mg/dL
Nitrite: NEGATIVE
Specific Gravity, Urine: 1.021 (ref 1.005–1.030)
Urobilinogen, UA: 0.2 mg/dL (ref 0.0–1.0)
pH: 5.5 (ref 5.0–8.0)

## 2013-02-28 LAB — URINE MICROSCOPIC-ADD ON

## 2013-02-28 LAB — TSH: TSH: 6.749 u[IU]/mL — ABNORMAL HIGH (ref 0.350–4.500)

## 2013-02-28 LAB — CBC
HCT: 36.8 % (ref 36.0–46.0)
MCH: 31.9 pg (ref 26.0–34.0)
MCV: 95.6 fL (ref 78.0–100.0)
Platelets: 169 10*3/uL (ref 150–400)
RDW: 13.6 % (ref 11.5–15.5)

## 2013-02-28 LAB — PHOSPHORUS: Phosphorus: 4.3 mg/dL (ref 2.3–4.6)

## 2013-02-28 LAB — TROPONIN I: Troponin I: <0.3 ng/mL (ref <0.30)

## 2013-02-28 LAB — HEMOGLOBIN A1C: Hgb A1c MFr Bld: 5.7 % — ABNORMAL HIGH (ref <5.7)

## 2013-02-28 MED ORDER — WARFARIN - PHARMACIST DOSING INPATIENT: Freq: Every day | Status: DC

## 2013-02-28 MED ORDER — ONDANSETRON HCL 4 MG PO TABS: 4.0000 mg | ORAL_TABLET | Freq: Four times a day (QID) | ORAL | Status: DC | PRN

## 2013-02-28 MED ORDER — SIMVASTATIN 5 MG PO TABS
5.0000 mg | ORAL_TABLET | Freq: Every evening | ORAL | Status: DC
  Administered 2013-02-28 – 2013-03-02 (×3): 5 mg via ORAL
  Filled 2013-02-28 (×4): qty 1

## 2013-02-28 MED ORDER — ENSURE PUDDING PO PUDG
1.0000 | Freq: Three times a day (TID) | ORAL | Status: DC
  Administered 2013-02-28 – 2013-03-03 (×8): 1 via ORAL

## 2013-02-28 MED ORDER — SODIUM CHLORIDE 0.9 % IV SOLN: INTRAVENOUS | Status: DC

## 2013-02-28 MED ORDER — WARFARIN SODIUM 5 MG PO TABS
5.0000 mg | ORAL_TABLET | Freq: Every day | ORAL | Status: DC
  Administered 2013-02-28 – 2013-03-02 (×3): 5 mg via ORAL
  Filled 2013-02-28 (×4): qty 1

## 2013-02-28 MED ORDER — ACETAMINOPHEN 650 MG RE SUPP: 650.0000 mg | Freq: Four times a day (QID) | RECTAL | Status: DC | PRN

## 2013-02-28 MED ORDER — LEVOTHYROXINE SODIUM 88 MCG PO TABS
88.0000 ug | ORAL_TABLET | Freq: Every day | ORAL | Status: DC
  Administered 2013-02-28 – 2013-03-03 (×4): 88 ug via ORAL
  Filled 2013-02-28 (×5): qty 1

## 2013-02-28 MED ORDER — SODIUM CHLORIDE 0.9 % IV BOLUS (SEPSIS)
500.0000 mL | Freq: Once | INTRAVENOUS | Status: AC
Start: 2013-02-28 — End: 2013-02-28
  Administered 2013-02-28: 500 mL via INTRAVENOUS

## 2013-02-28 MED ORDER — SODIUM CHLORIDE 0.9 % IV SOLN
INTRAVENOUS | Status: DC
  Administered 2013-02-28: 13:00:00 via INTRAVENOUS

## 2013-02-28 MED ORDER — DOCUSATE SODIUM 100 MG PO CAPS
100.0000 mg | ORAL_CAPSULE | Freq: Two times a day (BID) | ORAL | Status: DC
  Administered 2013-02-28 – 2013-03-03 (×7): 100 mg via ORAL
  Filled 2013-02-28 (×10): qty 1

## 2013-02-28 MED ORDER — ACETAMINOPHEN 325 MG PO TABS
650.0000 mg | ORAL_TABLET | Freq: Four times a day (QID) | ORAL | Status: DC | PRN
  Filled 2013-02-28: qty 2

## 2013-02-28 MED ORDER — SODIUM CHLORIDE 0.9 % IJ SOLN
3.0000 mL | Freq: Two times a day (BID) | INTRAMUSCULAR | Status: DC
  Administered 2013-02-28 – 2013-03-02 (×4): 3 mL via INTRAVENOUS

## 2013-02-28 MED ORDER — PANTOPRAZOLE SODIUM 40 MG PO TBEC
40.0000 mg | DELAYED_RELEASE_TABLET | Freq: Every day | ORAL | Status: DC
  Administered 2013-02-28 – 2013-03-03 (×4): 40 mg via ORAL
  Filled 2013-02-28 (×3): qty 1

## 2013-02-28 MED ORDER — SODIUM CHLORIDE 0.9 % IV SOLN
INTRAVENOUS | Status: DC
Start: 2013-02-28 — End: 2013-02-28
  Administered 2013-02-28: 05:00:00 via INTRAVENOUS

## 2013-02-28 MED ORDER — DILTIAZEM HCL ER 120 MG PO CP24
120.0000 mg | ORAL_CAPSULE | Freq: Every day | ORAL | Status: DC
  Filled 2013-02-28: qty 1

## 2013-02-28 MED ORDER — METOPROLOL SUCCINATE ER 25 MG PO TB24
25.0000 mg | ORAL_TABLET | Freq: Every day | ORAL | Status: DC
  Administered 2013-03-01 – 2013-03-03 (×3): 25 mg via ORAL
  Filled 2013-02-28 (×3): qty 1

## 2013-02-28 MED ORDER — FERROUS SULFATE 325 (65 FE) MG PO TABS
325.0000 mg | ORAL_TABLET | Freq: Every day | ORAL | Status: DC
  Administered 2013-02-28 – 2013-03-03 (×4): 325 mg via ORAL
  Filled 2013-02-28 (×5): qty 1

## 2013-02-28 MED ORDER — ONDANSETRON HCL 4 MG/2ML IJ SOLN: 4.0000 mg | Freq: Four times a day (QID) | INTRAMUSCULAR | Status: DC | PRN

## 2013-02-28 MED ORDER — HYDROCODONE-ACETAMINOPHEN 5-325 MG PO TABS
1.0000 | ORAL_TABLET | ORAL | Status: DC | PRN
  Administered 2013-02-28 – 2013-03-01 (×4): 1 via ORAL
  Filled 2013-02-28 (×4): qty 1

## 2013-02-28 MED ORDER — METOPROLOL SUCCINATE ER 50 MG PO TB24
50.0000 mg | ORAL_TABLET | Freq: Every day | ORAL | Status: DC
  Administered 2013-02-28: 50 mg via ORAL
  Filled 2013-02-28: qty 1

## 2013-02-28 NOTE — Progress Notes (Signed)
Physical Therapy Evaluation Patient Details Name: [name] MRN: [id number] DOB: 11-02-1921 Today's Date: 02/28/2013 Time: 4098-1191 PT Time Calculation (min): 20 min  PT Assessment / Plan / Recommendation Clinical Impression  pt admitted due to weakness and dizziness that started 3 days PTA.  Pt found to be orthostatic and likely dyhdrated due to diarrhea for about 2 weeks.  BP in sitting 113/60's  standing 96/60's  and symptomatic.  Pt can benefit from PT to maximize I.    PT Assessment  Patient needs continued PT services    Follow Up Recommendations  Home health PT;No PT follow up;Other (comment) (depending on pt progress)    Does the patient have the potential to tolerate intense rehabilitation      Barriers to Discharge Decreased caregiver support      Equipment Recommendations  None recommended by PT    Recommendations for Other Services     Frequency Min 3X/week    Precautions / Restrictions Precautions Precautions: Fall   Pertinent Vitals/Pain       Mobility  Bed Mobility Bed Mobility: Supine to Sit;Sitting - Scoot to Edge of Bed;Sit to Supine Supine to Sit: 7: Independent Sitting - Scoot to Edge of Bed: 7: Independent Sit to Supine: 7: Independent Details for Bed Mobility Assistance: safe mobility Transfers Transfers: Sit to Stand;Stand to Sit Sit to Stand: 5: Supervision;With upper extremity assist;From bed Stand to Sit: 5: Supervision;With upper extremity assist;To bed Details for Transfer Assistance: safe mobility, but Supervision due to orthostatics Ambulation/Gait Ambulation/Gait Assistance: Not tested (comment) (due to orthostatics) Stairs: No    Exercises     PT Diagnosis: Generalized weakness;Other (comment) (decr activity tolerance)  PT Problem List: Decreased strength;Decreased activity tolerance;Other (comment);Decreased mobility (orthostatics) PT Treatment Interventions: Gait training;Stair training;Functional mobility  training;Therapeutic activities;Patient/family education   PT Goals Acute Rehab PT Goals PT Goal Formulation: With patient Time For Goal Achievement: 03/07/13 Potential to Achieve Goals: Good Pt will go Sit to Stand: Independently PT Goal: Sit to Stand - Progress: Goal set today Pt will Transfer Bed to Chair/Chair to Bed: Independently PT Transfer Goal: Bed to Chair/Chair to Bed - Progress: Goal set today Pt will Ambulate: >150 feet;Independently;Other (comment) (in homelike environment) PT Goal: Ambulate - Progress: Goal set today Pt will Go Up / Down Stairs: 1-2 stairs;with modified independence;with rail(s) PT Goal: Up/Down Stairs - Progress: Goal set today  Visit Information  Last PT Received On: 02/28/13 Assistance Needed: +1    Subjective Data  Subjective: It's only been about 3 days since I started feeling dizzy. Patient Stated Goal: Be able to go back home by myself   Prior Functioning  Home Living Lives With: Alone Available Help at Discharge: Family;Friend(s);Available PRN/intermittently Type of Home: House Home Access: Stairs to enter [employer] of Steps: 2 Entrance Stairs-Rails: Right Home Layout: One level Bathroom Shower/Tub: Forensic scientist: Standard Home Adaptive Equipment: None Prior Function Level of Independence: Independent Able to Take Stairs?: Yes Driving: Yes (to church and sister's home only) Communication Communication: No difficulties    Cognition  Cognition Arousal/Alertness: Awake/alert Behavior During Therapy: WFL for tasks assessed/performed Overall Cognitive Status: Within Functional Limits for tasks assessed    Extremity/Trunk Assessment Right Upper Extremity Assessment RUE ROM/Strength/Tone: Within functional levels RUE Coordination: WFL - gross/fine motor Left Upper Extremity Assessment LUE ROM/Strength/Tone: Within functional levels LUE Coordination: WFL - gross/fine motor Right Lower  Extremity Assessment RLE ROM/Strength/Tone: Within functional levels Left Lower Extremity Assessment LLE ROM/Strength/Tone: Within functional levels Trunk Assessment  Trunk Assessment: Normal   Balance Balance Balance Assessed: Yes Static Sitting Balance Static Sitting - Balance Support: Feet supported;No upper extremity supported Static Sitting - Level of Assistance: 7: Independent  End of Session PT - End of Session Activity Tolerance: Patient tolerated treatment well Patient left: in bed;with call bell/phone within reach Nurse Communication: Mobility status  GP     Montford Barg, Eliseo Gum 02/28/2013, 12:26 PM 02/28/2013  Moraine Bing, PT [phone]  (pager)

## 2013-02-28 NOTE — ED Notes (Signed)
Report Atempted

## 2013-02-28 NOTE — Progress Notes (Signed)
Pt. Admitted to unit from ED at 0330. Pt. Alert and oriented. No distress or discomfort noted. Pt. C/o shingle pain to left side 9/10. PRN pain medication administered. VSS. Order for airborne/contact precautions entered. Room currently unavailable. Arrangements to have patient transferred in process. Staff with appropriated PPE for contact/airborne precautions.  Pt. Grandson at bedside. RN will continue to monitor pt. For changes in condition. Monte Bronder, Cheryll Dessert

## 2013-02-28 NOTE — Consult Note (Signed)
CARDIOLOGY CONSULT NOTE  Patient ID: [name] MRN: [id number] DOB/AGE: 77-Feb-1923 77 y.o.  Admit date: 02/27/2013 Primary Physician [name], MD Primary Cardiologist Dr. Myrtis Ser Chief Complaint  Dyspnea.  HPI:  The patient presented with diarrhea, decreased oral intake and possible dehydration. We are called at the request of her family to help with medication and volume management.  She has had decreased intake and progressive weakness over several weeks.  She has lost about 9 lbs over this time.  She has had no swelling and no PND or orthopnea although she is sleeping in a chair because of shingles pain.  She has had increasing heart rates when she tries to move around.  She has had dizziness and weakness with activity.  She does report some increased dyspnea with exertion but this has been mild.   Interestingly in the ER when her BNP was found to be greater than 4000 she was treated with IV diuretics.  On CXR and there was no clinical evidence of volume overload.     Past Medical History  Diagnosis Date  . A-fib     chronic  /  holter 06/2010, no brady, mild tachy  . Tachycardia     holter, 06/2010, no brady, mild  tachy  . CAD (coronary artery disease)     2 DES, [facility], 2004  . Pleural effusion associated with pulmonary infection 03-2007  . Hypothyroid   . Hyperlipidemia   . GERD (gastroesophageal reflux disease)   . Diverticulosis of colon   . Allergy   . Anemia   . Arthritis     osteo  . Hypertension   . COPD (chronic obstructive pulmonary disease)   . Mitral regurgitation     moderate, echo, 06/2010  . Vitamin B deficiency   . Compression fracture of lumbar vertebra 05-2008  . Osteoporosis   . Thrombocytopenia   . SOB (shortness of breath)   . OA (osteoarthritis)   . LBBB (left bundle branch block)   . Hx of colonoscopy   . Warfarin anticoagulation   . Ejection fraction     50% echo, 2008  /  EF  35-40%, echo, 06/2010  . TR (tricuspid regurgitation)    moderate, echo 2008  . Dyslipidemia   . Lung abnormality     Dr Shelle Iron 122/2011 no further w/u needed  . Prolonged QT interval     when on sotolol in past  . Volume overload     intermittant  . Hypertension   . Weight loss     August, 2012  . Preop cardiovascular exam     Preop clearance for kyphoplasty  . Nausea     Some nausea with medications, May, 2013,  . CHF (congestive heart failure)     Past Surgical History  Procedure Laterality Date  . Thyroidectomy  1962  . Tonsillectomy      Allergies  Allergen Reactions  . Alendronate Sodium     REACTION: GI  . Sulfonamide Derivatives     REACTION: sick   Prescriptions prior to admission  Medication Sig Dispense Refill  . acetaminophen (TYLENOL ARTHRITIS PAIN) 650 MG CR tablet Take 650 mg by mouth every 8 (eight) hours as needed for pain.       . Calcium Carbonate-Vitamin D (CALCIUM 600/VITAMIN D) 600-400 MG-UNIT per tablet Take 2 tablets by mouth daily.        . cyanocobalamin (,VITAMIN B-12,) 1000 MCG/ML injection Inject 1,000 mcg into the muscle every 30 (thirty) days.       [name]  diltiazem (DILACOR XR) 120 MG 24 hr capsule Take 120 mg by mouth daily.      . ferrous sulfate 325 (65 FE) MG tablet TAKE 1 TABLET EVERY DAY  90 tablet  0  . furosemide (LASIX) 20 MG tablet TAKE 2 TABLETS (40 MG TOTAL) BY MOUTH DAILY.  180 tablet  1  . glucosamine-chondroitin 500-400 MG tablet Take 1 tablet by mouth daily.        [name] levothyroxine (SYNTHROID, LEVOTHROID) 88 MCG tablet TAKE 1 TABLET (88 MCG TOTAL) BY MOUTH DAILY. **NEEDS OFFICE VISIT**  90 tablet  0  . Lutein 6 MG CAPS Take 1 capsule by mouth daily.       . metoprolol succinate (TOPROL-XL) 50 MG 24 hr tablet Take 1 tablet (50 mg total) by mouth daily. Take with or immediately following a meal.  90 tablet  3  . Multiple Vitamin (MULTIVITAMIN) tablet Take 1 tablet by mouth daily.       . nitroGLYCERIN (NITROSTAT) 0.4 MG SL tablet Place 0.4 mg under the tongue every 5 (five) minutes as needed  for chest pain.       [name] omeprazole (PRILOSEC) 20 MG capsule TAKE 1 CAPSULE TWICE A DAY * Needs appointment with Dr for additional refills*  180 capsule  0  . potassium chloride (K-DUR) 10 MEQ tablet Take 1 tablet (10 mEq total) by mouth daily.  90 tablet  3  . simvastatin (ZOCOR) 20 MG tablet Take 5 mg by mouth every evening.      . vitamin B-12 (CYANOCOBALAMIN) 1000 MCG tablet Take 1,000 mcg by mouth daily.      [name] warfarin (COUMADIN) 5 MG tablet Take 5 mg by mouth daily.       Family History  Problem Relation Age of Onset  . Heart disease Mother 34  . Stroke Father 60  . Heart disease Sister   . Cancer Brother     colon  . Diabetes Brother   . Leukemia Sister     History   Social History  . Marital Status: Widowed    Spouse Name: N/A    Number of Children: N/A  . Years of Education: N/A   Occupational History  . Not on file.   Social History Main Topics  . Smoking status: Former Smoker    Quit date: 09/14/1947  . Smokeless tobacco: Not on file  . Alcohol Use: No  . Drug Use: No  . Sexually Active: Not Currently   Other Topics Concern  . Not on file   Social History Narrative   Very independent    Lives in [place] by herself            ROS:  Black stools possibly related to Pepto bismol.  Otherwise as stated in the HPI and negative for all other systems.  Physical Exam: Blood pressure 104/60, pulse 83, temperature 97.5 F (36.4 C), temperature source Oral, resp. rate 20, height 5\' 8"  (1.727 m), weight 107 lb (48.535 kg), SpO2 99.00%.  GENERAL:  Very frail  appearing HEENT:  Pupils equal round and reactive, fundi not visualized, oral mucosa unremarkable NECK:  No jugular venous distention, waveform within normal limits, carotid upstroke brisk and symmetric, no bruits, no thyromegaly LYMPHATICS:  No cervical, inguinal adenopathy LUNGS:  Clear to auscultation bilaterally BACK:  No CVA tenderness CHEST:  Unremarkable HEART:  PMI not displaced or sustained,S1 and  S2 within normal limits, no S3, no clicks, no rubs, no murmurs, irregular ABD:  Flat, positive  bowel sounds normal in frequency in pitch, no bruits, no rebound, no guarding, no midline pulsatile mass, no hepatomegaly, no splenomegaly EXT:  2 plus pulses throughout, no edema, no cyanosis no clubbing, diffuse muscle wasting.   SKIN:  No rashes no nodules NEURO:  Cranial nerves II through XII grossly intact, motor grossly intact throughout PSYCH:  Cognitively intact, oriented to person place and time   Labs: Lab Results  Component Value Date   BUN 38* 02/28/2013   Lab Results  Component Value Date   CREATININE 1.54* 02/28/2013   Lab Results  Component Value Date   NA 140 02/28/2013   K 4.1 02/28/2013   CL 103 02/28/2013   CO2 29 02/28/2013   Lab Results  Component Value Date   TROPONINI <0.30 02/28/2013   Lab Results  Component Value Date   WBC 7.4 02/28/2013   HGB 12.3 02/28/2013   HCT 36.8 02/28/2013   MCV 95.6 02/28/2013   PLT 169 02/28/2013    Lab Results  Component Value Date   ALT 10 02/28/2013   AST 20 02/28/2013   ALKPHOS 44 02/28/2013   BILITOT 0.4 02/28/2013     Radiology:  CXR:  Stable cardiomegaly. Coronary artery stents. No acute  cardiopulmonary disease.   EKG:  Atrial fibrillation, LBBB, no change from previous.  02/28/2013   ASSESSMENT AND PLAN:   CHRONIC SYSTOLIC AND DIASTOLIC HF:  I agree that she is hypovolemic despite the elevated proBNP.  I agree with holding her diuretic for now although I would not suggest further hydration.  We can follow her volume as an outpatient and decide on when we would reinitiate Lasix.  I do not think that another echocardiogram will change our management and I will cancel this.  ATRIAL FIBRILLATION:  Her rate is OK.  Of note she was take off of Cardizem last year.  I will remove this from the hospital meds and she should not be sent home from this.  On warfarin.   CKD:  Elevated creat likely related to intravascular volume  depletion.  This is up from her baseline.  This should be followed closely.    SignedRollene Rotunda 02/28/2013, 11:39 AM

## 2013-02-28 NOTE — Progress Notes (Signed)
UR COMPLETED  

## 2013-02-28 NOTE — Progress Notes (Signed)
[facility]                                                                                Patient Demographics  [name], is a 79 y.o. female, DOB - 07-16-1922, WUJ:[id number], [id number]:[id number]  Admit date - 02/27/2013  Admitting Physician Therisa Doyne, MD  Outpatient Primary MD for the patient is [name], MD  LOS - 1   Chief Complaint  Patient presents with  . Palpitations        Assessment & Plan    1. Generalized weakness due to combination of diarrhea causing dehydration and orthostatic hypotension along with decreased by mouth intake due to recent shingles - should also has evidence of severe protein calorie malnutrition and appears cachectic and weak, will give her normal saline bolus along with maintenance normal saline as she is clearly orthostatic and dehydrated, increase activity, PT evaluation. Diarrhea has resolved will monitor nutritional input per dietitian.    2. Severe protein calorie malnutrition. Protein supplements per dietitian.    3. Atrial fibrillation.goal will be rate control, Coumadin to be dosed by pharmacy, continue beta blocker as tolerated by blood pressure.continue Cardizem. Per daughter's request cardiology consult called however no acute cardiac issues.    4. Acute renal failure due to dehydration. Continue IV fluids and monitor BMP.    5.Shingles rash on the right flank. Rash is completely dry and crusted, she has finished her acyclovir treatment, this rash is 27-month-old. Continue supportive care only.    Code Status: full  Family Communication: daughter  Disposition Plan: TBD   Procedures Echo   Consults  Cardiology   DVT Prophylaxis  Coumadin  Lab Results  Component Value Date   INR 2.19* 02/27/2013   INR 2.5* 02/06/2013   INR 3.0 01/11/2013     Lab Results  Component Value Date   PLT 169 02/28/2013    Medications  Scheduled Meds: . diltiazem  120 mg Oral Daily  . docusate sodium  100 mg  Oral BID  . feeding supplement  1 Container Oral TID BM  . ferrous sulfate  325 mg Oral Q breakfast  . levothyroxine  88 mcg Oral QAC breakfast  . metoprolol succinate  50 mg Oral Daily  . pantoprazole  40 mg Oral Daily  . simvastatin  5 mg Oral QPM  . sodium chloride  500 mL Intravenous Once  . sodium chloride  3 mL Intravenous Q12H  . warfarin  5 mg Oral q1800  . Warfarin - Pharmacist Dosing Inpatient   Does not apply q1800   Continuous Infusions: . sodium chloride     PRN Meds:.acetaminophen, acetaminophen, HYDROcodone-acetaminophen, ondansetron (ZOFRAN) IV, ondansetron  Antibiotics     Anti-infectives   None       Time Spent in minutes   35   Susa Raring K M.D on 02/28/2013 at 11:26 AM  Between 7am to 7pm - Pager - [phone]  After 7pm go to [email or web address] - password [email or web address]  And look for the night coverage person covering for me after hours  [facility] Office  [phone]    Subjective:   [name] today has, No headache, No chest pain, No abdominal pain -  No Nausea, No new weakness tingling or numbness, No Cough - SOB. mild generalized weakness  Objective:   Filed Vitals:   02/28/13 0215 02/28/13 0323 02/28/13 1104 02/28/13 1105  BP: 131/70 126/70 122/69 104/60  Pulse: 73 83 77 83  Temp:  97.5 F (36.4 C)    TempSrc:  Oral    Resp: 24 20    Height:  5\' 8"  (1.727 m)    Weight:  48.535 kg (107 lb)    SpO2: 98% 99%      Wt Readings from Last 3 Encounters:  02/28/13 48.535 kg (107 lb)  01/10/13 52.073 kg (114 lb 12.8 oz)  06/05/12 52.98 kg (116 lb 12.8 oz)    No intake or output data in the 24 hours ending 02/28/13 1126  Exam Awake Alert, Oriented X 3, No new F.N deficits, Normal affect Brady.AT,PERRAL Supple Neck,No JVD, No cervical lymphadenopathy appriciated.  Symmetrical Chest wall movement, Good air movement bilaterally, CTAB RRR,No Gallops,Rubs or new Murmurs, No Parasternal Heave +ve B.Sounds, Abd Soft, Non tender, No  organomegaly appriciated, No rebound - guarding or rigidity. No Cyanosis, Clubbing or edema, No new Rash or bruise , her shingles rash on her left flank is completely dry and crusted     Data Review   Micro Results No results found for this or any previous visit (from the past 240 hour(s)).  Radiology Reports Dg Chest 2 View  02/27/2013   *RADIOLOGY REPORT*  Clinical Data: Palpitations  CHEST - 2 VIEW  Comparison: Chest radiograph 02/29/2012  Findings: Stable moderate cardiomegaly. Coronary artery stents are visible projecting over the left heart.  Pulmonary vascularity is normal.  Probable atherosclerotic calcification of the right subclavian artery projecting over the right lung apex, when correlated with prior chest CT.  There is biapical pleuroparenchymal scarring, right greater than left, stable.  The lungs are mildly hyperinflated.  No airspace disease, effusion, or pneumothorax.  Diffuse osteopenia of the bones.  Vertebroplasty changes seen at one level in the lower thoracic spine.  IMPRESSION: Stable cardiomegaly.  Coronary artery stents.  No acute cardiopulmonary disease.   Original Report Authenticated By: Britta Mccreedy, M.D.    CBC  Recent Labs Lab 02/27/13 2120 02/28/13 0418  WBC 7.1 7.4  HGB 12.2 12.3  HCT 36.5 36.8  PLT 169 169  MCV 95.3 95.6  MCH 31.9 31.9  MCHC 33.4 33.4  RDW 13.7 13.6    Chemistries   Recent Labs Lab 02/27/13 2120 02/28/13 0418  NA 139 140  K 4.8 4.1  CL 103 103  CO2 27 29  GLUCOSE 87 88  BUN 38* 38*  CREATININE 1.62* 1.54*  CALCIUM 9.3 9.5  MG  --  2.2  AST  --  20  ALT  --  10  ALKPHOS  --  44  BILITOT  --  0.4   ------------------------------------------------------------------------------------------------------------------ estimated creatinine clearance is 19 ml/min (by C-G formula based on Cr of 1.54). ------------------------------------------------------------------------------------------------------------------ No results  found for this basename: HGBA1C,  in the last 72 hours ------------------------------------------------------------------------------------------------------------------ No results found for this basename: CHOL, HDL, LDLCALC, TRIG, CHOLHDL, LDLDIRECT,  in the last 72 hours ------------------------------------------------------------------------------------------------------------------  Recent Labs  02/28/13 0418  TSH 6.749*   ------------------------------------------------------------------------------------------------------------------ No results found for this basename: VITAMINB12, FOLATE, FERRITIN, TIBC, IRON, RETICCTPCT,  in the last 72 hours  Coagulation profile  Recent Labs Lab 02/27/13 2120  INR 2.19*    No results found for this basename: DDIMER,  in the last 72 hours  Cardiac Enzymes  Recent Labs Lab 02/28/13 0418  TROPONINI <0.30   ------------------------------------------------------------------------------------------------------------------ No components found with this basename: POCBNP,

## 2013-02-28 NOTE — Progress Notes (Signed)
Echo Lab  2D Echocardiogram completed.  Tharun Cappella L Tacori Kvamme, RDCS 02/28/2013 9:57 AM

## 2013-02-28 NOTE — Progress Notes (Signed)
Patient reassessed. Vital signs within normal limits.  Orthostatic vital signs were negative at this time.

## 2013-02-28 NOTE — ED Notes (Signed)
Report attempted 

## 2013-02-28 NOTE — Progress Notes (Signed)
ANTICOAGULATION CONSULT NOTE - Initial Consult  Pharmacy Consult for coumadin  Indication: atrial fibrillation  Allergies  Allergen Reactions  . Alendronate Sodium     REACTION: GI  . Sulfonamide Derivatives     REACTION: sick    Patient Measurements: Height: 5\' 8"  (172.7 cm) Weight: 107 lb (48.535 kg) (scale C) IBW/kg (Calculated) : 63.9 Heparin Dosing Weight:   Vital Signs: Temp: 97.5 F (36.4 C) (06/18 0323) Temp src: Oral (06/18 0323) BP: 126/70 mmHg (06/18 0323) Pulse Rate: 83 (06/18 0323)  Labs:  Recent Labs  02/27/13 2120  HGB 12.2  HCT 36.5  PLT 169  LABPROT 23.4*  INR 2.19*  CREATININE 1.62*    Estimated Creatinine Clearance: 18 ml/min (by C-G formula based on Cr of 1.62).   Medical History: Past Medical History  Diagnosis Date  . A-fib     chronic  /  holter 06/2010, no brady, mild tachy  . Tachycardia     holter, 06/2010, no brady, mild  tachy  . CAD (coronary artery disease)     2 DES, [facility], 2004  . Pleural effusion associated with pulmonary infection 03-2007  . Hypothyroid   . Hyperlipidemia   . GERD (gastroesophageal reflux disease)   . Diverticulosis of colon   . Allergy   . Anemia   . Arthritis     osteo  . Hypertension   . COPD (chronic obstructive pulmonary disease)   . Mitral regurgitation     moderate, echo, 06/2010  . Vitamin B deficiency   . Compression fracture of lumbar vertebra 05-2008  . Osteoporosis   . Thrombocytopenia   . SOB (shortness of breath)   . OA (osteoarthritis)   . LBBB (left bundle branch block)   . Hx of colonoscopy   . Warfarin anticoagulation   . Ejection fraction     50% echo, 2008  /  EF  35-40%, echo, 06/2010  . TR (tricuspid regurgitation)     moderate, echo 2008  . Dyslipidemia   . Lung abnormality     Dr Shelle Iron 122/2011 no further w/u needed  . Prolonged QT interval     when on sotolol in past  . Volume overload     intermittant  . Hypertension   . Weight loss     August, 2012  .  Preop cardiovascular exam     Preop clearance for kyphoplasty  . Nausea     Some nausea with medications, May, 2013,  . CHF (congestive heart failure)     Medications:  Prescriptions prior to admission  Medication Sig Dispense Refill  . acetaminophen (TYLENOL ARTHRITIS PAIN) 650 MG CR tablet Take 650 mg by mouth every 8 (eight) hours as needed for pain.       . Calcium Carbonate-Vitamin D (CALCIUM 600/VITAMIN D) 600-400 MG-UNIT per tablet Take 2 tablets by mouth daily.        . cyanocobalamin (,VITAMIN B-12,) 1000 MCG/ML injection Inject 1,000 mcg into the muscle every 30 (thirty) days.       [name] diltiazem (DILACOR XR) 120 MG 24 hr capsule Take 120 mg by mouth daily.      . ferrous sulfate 325 (65 FE) MG tablet TAKE 1 TABLET EVERY DAY  90 tablet  0  . furosemide (LASIX) 20 MG tablet TAKE 2 TABLETS (40 MG TOTAL) BY MOUTH DAILY.  180 tablet  1  . glucosamine-chondroitin 500-400 MG tablet Take 1 tablet by mouth daily.        [name] levothyroxine (  SYNTHROID, LEVOTHROID) 88 MCG tablet TAKE 1 TABLET (88 MCG TOTAL) BY MOUTH DAILY. **NEEDS OFFICE VISIT**  90 tablet  0  . Lutein 6 MG CAPS Take 1 capsule by mouth daily.       . metoprolol succinate (TOPROL-XL) 50 MG 24 hr tablet Take 1 tablet (50 mg total) by mouth daily. Take with or immediately following a meal.  90 tablet  3  . Multiple Vitamin (MULTIVITAMIN) tablet Take 1 tablet by mouth daily.       . nitroGLYCERIN (NITROSTAT) 0.4 MG SL tablet Place 0.4 mg under the tongue every 5 (five) minutes as needed for chest pain.       [name] omeprazole (PRILOSEC) 20 MG capsule TAKE 1 CAPSULE TWICE A DAY * Needs appointment with Dr for additional refills*  180 capsule  0  . potassium chloride (K-DUR) 10 MEQ tablet Take 1 tablet (10 mEq total) by mouth daily.  90 tablet  3  . simvastatin (ZOCOR) 20 MG tablet Take 5 mg by mouth every evening.      . vitamin B-12 (CYANOCOBALAMIN) 1000 MCG tablet Take 1,000 mcg by mouth daily.      [name] warfarin (COUMADIN) 5 MG tablet Take  5 mg by mouth daily.        Assessment: 79 yo admitted for SOB and dizziness fatigue and shingle pain. Therapeutic  INR  2.19 -no bleeding noted  Goal of Therapy:  INR 2-3 Monitor platelets by anticoagulation protocol: Yes   Plan:  Continue coumadin 5mg  daily  Daily PT/inr   Janice Coffin 02/28/2013,4:40 AM

## 2013-02-28 NOTE — Progress Notes (Signed)
INITIAL NUTRITION ASSESSMENT  DOCUMENTATION CODES Per approved criteria  -Severe malnutrition in the context of acute illness or injury -Underweight   INTERVENTION: 1. Ensure Pudding po TID, each supplement provides 170 kcal and 4 grams of protein.    NUTRITION DIAGNOSIS: Malnutrition related to poor appetite as evidenced by weight loss and inability to meet nutrition needs.   Goal: PO intake to meet >/=90% estimated nutrition needs  Monitor:  PO intake, weight trends, labs, I/O's  Reason for Assessment: Malnutrition Screening Tool  79 y.o. female  Admitting Dx: Dehydration  ASSESSMENT: Pt admitted with recent shingles, dehydration from poor oral intake and diarrhea,  ARF and fatigue.   Weight hs shows 7 lb weight loss in the last 2 months (6.1% body weight). Some weight loss likely related to fluid status.  Family reports weight loss was over one month time frame, 114 lbs down to 104 lbs. Has been eating less then 1/2 of her usual portions. Encouraged to drink oral nutrition shakes by MD, but pt states she can only get in 1/2 can per day.   Pt meets criteria for severe malnutrition in the context of acute illness 2/2 9% body weight loss in 1 month and meeting </=50% estimated energy needs for >/=5 days.   Pt is agreeable to ensure pudding supplements. RD encouraged small frequent meals increased calorie density of foods.   Height: Ht Readings from Last 1 Encounters:  02/28/13 5\' 8"  (1.727 m)    Weight: Wt Readings from Last 1 Encounters:  02/28/13 107 lb (48.535 kg)   Ideal Body Weight: 140 lbs  % Ideal Body Weight: 76%  Wt Readings from Last 10 Encounters:  02/28/13 107 lb (48.535 kg)  01/10/13 114 lb 12.8 oz (52.073 kg)  06/05/12 116 lb 12.8 oz (52.98 kg)  05/24/12 110 lb (49.896 kg)  05/09/12 117 lb (53.071 kg)  02/29/12 114 lb 12 oz (52.05 kg)  02/23/12 115 lb (52.164 kg)  02/02/12 120 lb (54.432 kg)  01/31/12 120 lb 12 oz (54.772 kg)  10/25/11 119 lb  (53.978 kg)    Usual Body Weight: 114 lbs   % Usual Body Weight: 94%  BMI:  Body mass index is 16.27 kg/(m^2). Underweight   Estimated Nutritional Needs: Kcal: 1300-1500 Protein: 50-60 gm  Fluid: 1.3-1.5 L   Skin: intact   Diet Order: Cardiac  EDUCATION NEEDS: -Education needs addressed  No intake or output data in the 24 hours ending 02/28/13 1010  Last BM: PTA    Labs:   Recent Labs Lab 02/27/13 2120 02/28/13 0418  NA 139 140  K 4.8 4.1  CL 103 103  CO2 27 29  BUN 38* 38*  CREATININE 1.62* 1.54*  CALCIUM 9.3 9.5  MG  --  2.2  PHOS  --  4.3  GLUCOSE 87 88    CBG (last 3)  No results found for this basename: GLUCAP,  in the last 72 hours  Scheduled Meds: . diltiazem  120 mg Oral Daily  . docusate sodium  100 mg Oral BID  . ferrous sulfate  325 mg Oral Q breakfast  . levothyroxine  88 mcg Oral QAC breakfast  . metoprolol succinate  50 mg Oral Daily  . pantoprazole  40 mg Oral Daily  . simvastatin  5 mg Oral QPM  . sodium chloride  3 mL Intravenous Q12H  . warfarin  5 mg Oral q1800  . Warfarin - Pharmacist Dosing Inpatient   Does not apply q1800    Continuous  Infusions: . sodium chloride      Past Medical History  Diagnosis Date  . A-fib     chronic  /  holter 06/2010, no brady, mild tachy  . Tachycardia     holter, 06/2010, no brady, mild  tachy  . CAD (coronary artery disease)     2 DES, [facility], 2004  . Pleural effusion associated with pulmonary infection 03-2007  . Hypothyroid   . Hyperlipidemia   . GERD (gastroesophageal reflux disease)   . Diverticulosis of colon   . Allergy   . Anemia   . Arthritis     osteo  . Hypertension   . COPD (chronic obstructive pulmonary disease)   . Mitral regurgitation     moderate, echo, 06/2010  . Vitamin B deficiency   . Compression fracture of lumbar vertebra 05-2008  . Osteoporosis   . Thrombocytopenia   . SOB (shortness of breath)   . OA (osteoarthritis)   . LBBB (left bundle branch block)    . Hx of colonoscopy   . Warfarin anticoagulation   . Ejection fraction     50% echo, 2008  /  EF  35-40%, echo, 06/2010  . TR (tricuspid regurgitation)     moderate, echo 2008  . Dyslipidemia   . Lung abnormality     Dr Shelle Iron 122/2011 no further w/u needed  . Prolonged QT interval     when on sotolol in past  . Volume overload     intermittant  . Hypertension   . Weight loss     August, 2012  . Preop cardiovascular exam     Preop clearance for kyphoplasty  . Nausea     Some nausea with medications, May, 2013,  . CHF (congestive heart failure)     Past Surgical History  Procedure Laterality Date  . Thyroidectomy  1962  . Tonsillectomy      Clarene [facility] RD, LDN Pager [phone] After Hours pager [phone]

## 2013-02-28 NOTE — H&P (Signed)
PCP:  [name], MD  [facility]  Chief Complaint:  Shortness of breath  HPI: [name] is a 79 y.o. female   has a past medical history of A-fib; Tachycardia; CAD (coronary artery disease); Pleural effusion associated with pulmonary infection (03-2007); Hypothyroid; Hyperlipidemia; GERD (gastroesophageal reflux disease); Diverticulosis of colon; Allergy; Anemia; Arthritis; Hypertension; COPD (chronic obstructive pulmonary disease); Mitral regurgitation; Vitamin B deficiency; Compression fracture of lumbar vertebra (05-2008); Osteoporosis; Thrombocytopenia; SOB (shortness of breath); OA (osteoarthritis); LBBB (left bundle branch block); colonoscopy; Warfarin anticoagulation; Ejection fraction; TR (tricuspid regurgitation); Dyslipidemia; Lung abnormality; Prolonged QT interval; Volume overload; Hypertension; Weight loss; Preop cardiovascular exam; Nausea; and CHF (congestive heart failure).   Presented with  Hx of Shortness of breath and dizziness when she is walking. She have had some diarrhea and have not been eating well. She has had shingles for the past 3 weeks. She has finished a course of acyclovir but continues to have pain at the site. She reports shortness of breath that is chronic. Her family brought her in due to fatigue and shingle discomfort. Family thinks she may be dehydrated. Denies any CP. IN ED her BNP was noted to be elevated to 4000 and she was treated with lasix of note CXR did not show pulmonary edema, patient is sating mid 90's on RA no peripheral edema noted. Hospitalist called for admission.  Review of Systems:     Pertinent positives include: shortness of breath at rest.  Constitutional:  No weight loss, night sweats, Fevers, chills, fatigue, weight loss  HEENT:  No headaches, Difficulty swallowing,Tooth/dental problems,Sore throat,  No sneezing, itching, ear ache, nasal congestion, post nasal drip,  Cardio-vascular:  No chest pain, Orthopnea, PND,  anasarca, dizziness, palpitations.no Bilateral lower extremity swelling  GI:  No heartburn, indigestion, abdominal pain, nausea, vomiting, diarrhea, change in bowel habits, loss of appetite, melena, blood in stool, hematemesis Resp:  no  No dyspnea on exertion, No excess mucus, no productive cough, No non-productive cough, No coughing up of blood.No change in color of mucus.No wheezing. Skin:  no rash or lesions. No jaundice GU:  no dysuria, change in color of urine, no urgency or frequency. No straining to urinate.  No flank pain.  Musculoskeletal:  No joint pain or no joint swelling. No decreased range of motion. No back pain.  Psych:  No change in mood or affect. No depression or anxiety. No memory loss.  Neuro: no localizing neurological complaints, no tingling, no weakness, no double vision, no gait abnormality, no slurred speech, no confusion  Otherwise ROS are negative except for above, 10 systems were reviewed  Past Medical History: Past Medical History  Diagnosis Date  . A-fib     chronic  /  holter 06/2010, no brady, mild tachy  . Tachycardia     holter, 06/2010, no brady, mild  tachy  . CAD (coronary artery disease)     2 DES, [facility], 2004  . Pleural effusion associated with pulmonary infection 03-2007  . Hypothyroid   . Hyperlipidemia   . GERD (gastroesophageal reflux disease)   . Diverticulosis of colon   . Allergy   . Anemia   . Arthritis     osteo  . Hypertension   . COPD (chronic obstructive pulmonary disease)   . Mitral regurgitation     moderate, echo, 06/2010  . Vitamin B deficiency   . Compression fracture of lumbar vertebra 05-2008  . Osteoporosis   . Thrombocytopenia   . SOB (shortness of breath)   .  OA (osteoarthritis)   . LBBB (left bundle branch block)   . Hx of colonoscopy   . Warfarin anticoagulation   . Ejection fraction     50% echo, 2008  /  EF  35-40%, echo, 06/2010  . TR (tricuspid regurgitation)     moderate, echo 2008  . Dyslipidemia    . Lung abnormality     Dr Shelle Iron 122/2011 no further w/u needed  . Prolonged QT interval     when on sotolol in past  . Volume overload     intermittant  . Hypertension   . Weight loss     August, 2012  . Preop cardiovascular exam     Preop clearance for kyphoplasty  . Nausea     Some nausea with medications, May, 2013,  . CHF (congestive heart failure)    Past Surgical History  Procedure Laterality Date  . Thyroidectomy  1962  . Tonsillectomy       Medications: Prior to Admission medications   Medication Sig Start Date End Date Taking? Authorizing Provider  acetaminophen (TYLENOL ARTHRITIS PAIN) 650 MG CR tablet Take 650 mg by mouth every 8 (eight) hours as needed for pain.    Yes Historical Provider, MD  Calcium Carbonate-Vitamin D (CALCIUM 600/VITAMIN D) 600-400 MG-UNIT per tablet Take 2 tablets by mouth daily.     Yes Historical Provider, MD  cyanocobalamin (,VITAMIN B-12,) 1000 MCG/ML injection Inject 1,000 mcg into the muscle every 30 (thirty) days.    Yes Historical Provider, MD  diltiazem (DILACOR XR) 120 MG 24 hr capsule Take 120 mg by mouth daily.   Yes Historical Provider, MD  ferrous sulfate 325 (65 FE) MG tablet TAKE 1 TABLET EVERY DAY 01/03/13  Yes Judy Pimple, MD  furosemide (LASIX) 20 MG tablet TAKE 2 TABLETS (40 MG TOTAL) BY MOUTH DAILY. 12/11/12  Yes Luis Abed, MD  glucosamine-chondroitin 500-400 MG tablet Take 1 tablet by mouth daily.     Yes Historical Provider, MD  levothyroxine (SYNTHROID, LEVOTHROID) 88 MCG tablet TAKE 1 TABLET (88 MCG TOTAL) BY MOUTH DAILY. **NEEDS OFFICE VISIT** 12/11/12  Yes Luis Abed, MD  Lutein 6 MG CAPS Take 1 capsule by mouth daily.     Yes Historical Provider, MD  metoprolol succinate (TOPROL-XL) 50 MG 24 hr tablet Take 1 tablet (50 mg total) by mouth daily. Take with or immediately following a meal. 05/19/12 05/19/13 Yes Scott Moishe Spice, PA-C  Multiple Vitamin (MULTIVITAMIN) tablet Take 1 tablet by mouth daily.    Yes  Historical Provider, MD  nitroGLYCERIN (NITROSTAT) 0.4 MG SL tablet Place 0.4 mg under the tongue every 5 (five) minutes as needed for chest pain.    Yes Historical Provider, MD  omeprazole (PRILOSEC) 20 MG capsule TAKE 1 CAPSULE TWICE A DAY * Needs appointment with Dr for additional refills* 01/04/13  Yes Judy Pimple, MD  potassium chloride (K-DUR) 10 MEQ tablet Take 1 tablet (10 mEq total) by mouth daily. 05/09/12 05/09/13 Yes Scott T Weaver, PA-C  simvastatin (ZOCOR) 20 MG tablet Take 5 mg by mouth every evening.   Yes Historical Provider, MD  vitamin B-12 (CYANOCOBALAMIN) 1000 MCG tablet Take 1,000 mcg by mouth daily.   Yes Historical Provider, MD  warfarin (COUMADIN) 5 MG tablet Take 5 mg by mouth daily.   Yes Historical Provider, MD    Allergies:   Allergies  Allergen Reactions  . Alendronate Sodium     REACTION: GI  . Sulfonamide Derivatives  REACTION: sick    Social History:   reports that she quit smoking about 65 years ago. She does not have any smokeless tobacco history on file. She reports that she does not drink alcohol or use illicit drugs.   Family History: family history includes Cancer in her brother; Diabetes in her brother; Heart disease in her sister; Heart disease (age of onset: 57) in her mother; Leukemia in her sister; and Stroke (age of onset: 69) in her father.    Physical Exam: Patient Vitals for the past 24 hrs:  BP Temp Temp src Pulse Resp SpO2  02/27/13 2151 115/58 mmHg 98.3 F (36.8 C) Oral - 20 100 %  02/27/13 1857 91/48 mmHg 97.9 F (36.6 C) Oral 78 16 100 %    1. General:  in No Acute distress 2. Psychological: Alert and   Oriented 3. Head/ENT:     Dry Mucous Membranes                          Head Non traumatic, neck supple                          Normal Dentition 4. SKIN:  decreased Skin turgor,  Skin clean Dry are of small vesicles and crust under Left breast going to the back consistent with shingles 5. Heart: irregular rate and rhythm  no Murmur, Rub or gallop 6. Lungs: Clear to auscultation bilaterally, no wheezes or crackles   7. Abdomen: Soft, non-tender, Non distended 8. Lower extremities: no clubbing, cyanosis, or edema 9. Neurologically Grossly intact, moving all 4 extremities equally 10. MSK: Normal range of motion  body mass index is unknown because there is no weight on file.   Labs on Admission:   Recent Labs  02/27/13 2120  NA 139  K 4.8  CL 103  CO2 27  GLUCOSE 87  BUN 38*  CREATININE 1.62*  CALCIUM 9.3   No results found for this basename: AST, ALT, ALKPHOS, BILITOT, PROT, ALBUMIN,  in the last 72 hours No results found for this basename: LIPASE, AMYLASE,  in the last 72 hours  Recent Labs  02/27/13 2120  WBC 7.1  HGB 12.2  HCT 36.5  MCV 95.3  PLT 169   No results found for this basename: CKTOTAL, CKMB, CKMBINDEX, TROPONINI,  in the last 72 hours No results found for this basename: TSH, T4TOTAL, FREET3, T3FREE, THYROIDAB,  in the last 72 hours No results found for this basename: VITAMINB12, FOLATE, FERRITIN, TIBC, IRON, RETICCTPCT,  in the last 72 hours No results found for this basename: HGBA1C    The CrCl is unknown because both a height and weight (above a minimum accepted value) are required for this calculation. ABG    Component Value Date/Time   HCO3 24.1* 03/10/2007 1635   TCO2 23 04/16/2008 1512     Lab Results  Component Value Date   DDIMER  Value: 1.36        AT THE INHOUSE ESTABLISHED CUTOFF VALUE OF 0.48 ug/mL FEU, THIS ASSAY HAS BEEN DOCUMENTED IN THE LITERATURE TO HAVE* 03/10/2007     Other results:  I have pearsonaly reviewed this: ECG REPORT  Rate: 77  Rhythm: A. fib ST&T Change: non specific ST seg changes  BNP 4402  Cultures:    Component Value Date/Time   SDES URINE, RANDOM 03/10/2007 1711   SPECREQUEST NONE 03/10/2007 1711   CULT ESCHERICHIA COLI 03/10/2007  1711   REPTSTATUS 03/13/2007 FINAL 03/10/2007 1711       Radiological Exams on Admission: Dg  Chest 2 View  02/27/2013   *RADIOLOGY REPORT*  Clinical Data: Palpitations  CHEST - 2 VIEW  Comparison: Chest radiograph 02/29/2012  Findings: Stable moderate cardiomegaly. Coronary artery stents are visible projecting over the left heart.  Pulmonary vascularity is normal.  Probable atherosclerotic calcification of the right subclavian artery projecting over the right lung apex, when correlated with prior chest CT.  There is biapical pleuroparenchymal scarring, right greater than left, stable.  The lungs are mildly hyperinflated.  No airspace disease, effusion, or pneumothorax.  Diffuse osteopenia of the bones.  Vertebroplasty changes seen at one level in the lower thoracic spine.  IMPRESSION: Stable cardiomegaly.  Coronary artery stents.  No acute cardiopulmonary disease.   Original Report Authenticated By: Britta Mccreedy, M.D.    Chart has been reviewed  Assessment/Plan   79 yo with recent diagnosis of shingles, diarrhea and poor PO intake comes in with ARF and fatigue likely due to dehydration.   Present on Admission:  . Dehydration - hold lasix for now and give gentle IVF . A-fib - continue home meds, warfarin, rate controlled. Monitor on tele, stable CM on CXR . Acute renal failure - likely due to dehydration, check FENa and hold lasix, gentle IVF and follow Cr. No evince of CHF despite elevated BNP . Shortness of breath - chronic, patient at this poin denies worsening from baseline. Will cycle CE . Shingles - pain management, given still some vesicles will continue contact and airborne precautions.  Diarrhea -  now resolved  Prophylaxis: coumadin Protonix  CODE STATUS: needs to be addressed in AM  Other plan as per orders.  I have spent a total of 55 min on this admission  Daviona Herbert 02/28/2013, 12:29 AM

## 2013-03-01 DIAGNOSIS — I5023 Acute on chronic systolic (congestive) heart failure: Secondary | ICD-10-CM

## 2013-03-01 LAB — BASIC METABOLIC PANEL
BUN: 27 mg/dL — ABNORMAL HIGH (ref 6–23)
CO2: 24 mEq/L (ref 19–32)
Calcium: 8.3 mg/dL — ABNORMAL LOW (ref 8.4–10.5)
Creatinine, Ser: 1 mg/dL (ref 0.50–1.10)
GFR calc non Af Amer: 48 mL/min — ABNORMAL LOW (ref >90)
Glucose, Bld: 89 mg/dL (ref 70–99)

## 2013-03-01 LAB — URINE CULTURE

## 2013-03-01 MED ORDER — GABAPENTIN 100 MG PO CAPS
200.0000 mg | ORAL_CAPSULE | Freq: Three times a day (TID) | ORAL | Status: DC
  Administered 2013-03-01 – 2013-03-03 (×7): 200 mg via ORAL
  Filled 2013-03-01 (×9): qty 2

## 2013-03-01 MED ORDER — IBUPROFEN 400 MG PO TABS
400.0000 mg | ORAL_TABLET | Freq: Three times a day (TID) | ORAL | Status: AC
  Administered 2013-03-02 (×2): 400 mg via ORAL
  Filled 2013-03-01 (×6): qty 1

## 2013-03-01 MED ORDER — ALUM & MAG HYDROXIDE-SIMETH 200-200-20 MG/5ML PO SUSP
30.0000 mL | Freq: Four times a day (QID) | ORAL | Status: DC | PRN
  Administered 2013-03-01: 30 mL via ORAL
  Filled 2013-03-01: qty 30

## 2013-03-01 MED ORDER — MIDODRINE HCL 5 MG PO TABS
5.0000 mg | ORAL_TABLET | Freq: Two times a day (BID) | ORAL | Status: DC
Start: 2013-03-01 — End: 2013-03-03
  Administered 2013-03-01 – 2013-03-03 (×4): 5 mg via ORAL
  Filled 2013-03-01 (×7): qty 1

## 2013-03-01 MED ORDER — SODIUM CHLORIDE 0.9 % IV SOLN
INTRAVENOUS | Status: AC
  Administered 2013-03-01: 12:00:00 via INTRAVENOUS

## 2013-03-01 MED ORDER — FUROSEMIDE 10 MG/ML IJ SOLN
20.0000 mg | Freq: Once | INTRAMUSCULAR | Status: DC
  Filled 2013-03-01: qty 2

## 2013-03-01 MED ORDER — TRAMADOL HCL 50 MG PO TABS
50.0000 mg | ORAL_TABLET | Freq: Four times a day (QID) | ORAL | Status: DC | PRN
  Filled 2013-03-01: qty 1

## 2013-03-01 NOTE — Progress Notes (Signed)
SUBJECTIVE:  Very weak.  No acute complaints but still with chronic dyspnea   PHYSICAL EXAM Filed Vitals:   03/01/13 0132 03/01/13 0659 03/01/13 0830 03/01/13 0832  BP: 112/64 139/78 126/57 99/54  Pulse: 83 82 89 93  Temp: 98.3 F (36.8 C) 98.2 F (36.8 C)    TempSrc: Oral Oral    Resp: 18 20    Height:      Weight:  111 lb 6.4 oz (50.531 kg)    SpO2: 98% 98%     General:  No distress, very frail.  Lungs:  Clear Heart:  Irregular Abdomen:  Positive bowel sounds, no rebound no guarding Extremities:  No edema  LABS: Lab Results  Component Value Date   TROPONINI <0.30 02/28/2013   Results for orders placed during the hospital encounter of 02/27/13 (from the past 24 hour(s))  TROPONIN I     Status: None   Collection Time    02/28/13 10:30 AM      Result Value Range   Troponin I <0.30  <0.30 ng/mL  URINALYSIS, ROUTINE W REFLEX MICROSCOPIC     Status: Abnormal   Collection Time    02/28/13  1:29 PM      Result Value Range   Color, Urine YELLOW  YELLOW   APPearance CLEAR  CLEAR   Specific Gravity, Urine 1.021  1.005 - 1.030   pH 5.5  5.0 - 8.0   Glucose, UA NEGATIVE  NEGATIVE mg/dL   Hgb urine dipstick NEGATIVE  NEGATIVE   Bilirubin Urine NEGATIVE  NEGATIVE   Ketones, ur NEGATIVE  NEGATIVE mg/dL   Protein, ur NEGATIVE  NEGATIVE mg/dL   Urobilinogen, UA 0.2  0.0 - 1.0 mg/dL   Nitrite NEGATIVE  NEGATIVE   Leukocytes, UA SMALL (*) NEGATIVE  URINE MICROSCOPIC-ADD ON     Status: Abnormal   Collection Time    02/28/13  1:29 PM      Result Value Range   Squamous Epithelial / LPF RARE  RARE   WBC, UA 3-6  <3 WBC/hpf   RBC / HPF 0-2  <3 RBC/hpf   Bacteria, UA RARE  RARE   Casts HYALINE CASTS (*) NEGATIVE  TROPONIN I     Status: None   Collection Time    02/28/13  3:26 PM      Result Value Range   Troponin I <0.30  <0.30 ng/mL  PROTIME-INR     Status: Abnormal   Collection Time    03/01/13  6:31 AM      Result Value Range   Prothrombin Time 25.2 (*) 11.6 - 15.2  seconds   INR 2.42 (*) 0.00 - 1.49  BASIC METABOLIC PANEL     Status: Abnormal   Collection Time    03/01/13  6:31 AM      Result Value Range   Sodium 138  135 - 145 mEq/L   Potassium 4.0  3.5 - 5.1 mEq/L   Chloride 106  96 - 112 mEq/L   CO2 24  19 - 32 mEq/L   Glucose, Bld 89  70 - 99 mg/dL   BUN 27 (*) 6 - 23 mg/dL   Creatinine, Ser 6.04  0.50 - 1.10 mg/dL   Calcium 8.3 (*) 8.4 - 10.5 mg/dL   GFR calc non Af Amer 48 (*) >90 mL/min   GFR calc Af Amer 56 (*) >90 mL/min    Intake/Output Summary (Last 24 hours) at 03/01/13 5409 Last data filed at 03/01/13 0701  Gross  per 24 hour  Intake 2061.25 ml  Output   1552 ml  Net 509.25 ml    ASSESSMENT AND PLAN:  Chronic systolic and diastolic HF:   EF on echo this admit reported to be 50% (I will review this).  This is better than previous.  I do not suspect any volume overload.  I would hold diuretics again today.   Atrial fibrillation:  Telemetry reviewed.  Rate is controlled on current meds.  No change in therapy.   Fayrene Fearing [facility] 03/01/2013 9:28 AM

## 2013-03-01 NOTE — Progress Notes (Signed)
Physical Therapy Treatment Patient Details Name: [name] MRN: [id number] DOB: 09-17-1921 Today's Date: 03/01/2013 Time: 4098-1191 PT Time Calculation (min): 20 min  PT Assessment / Plan / Recommendation Comments on Treatment Session  Pt progressing with PT goals & mobility at this date.  Increased ambulation distance.  Pt does report feeling weak therefore ambulated with RW to ensure safety.  Did well with RW.  Encouraged pt to use RW initially.  No c/o dizziness with activity.      Follow Up Recommendations  Home health PT     Does the patient have the potential to tolerate intense rehabilitation     Barriers to Discharge        Equipment Recommendations  Rolling walker with 5" wheels    Recommendations for Other Services    Frequency Min 3X/week   Plan Discharge plan remains appropriate;Frequency remains appropriate    Precautions / Restrictions Precautions Precautions: Fall Restrictions Weight Bearing Restrictions: No   Pertinent Vitals/Pain BP sitting EOB 125/70       Standing EOB 112/63 Pt with no c/o dizziness with activity.      Mobility  Bed Mobility Details for Bed Mobility Assistance: Pt sitting on EOB upon arrival.   Transfers Transfers: Sit to Stand;Stand to Sit Sit to Stand: 5: Supervision;With upper extremity assist;From bed;From toilet Stand to Sit: 5: Supervision;With upper extremity assist;With armrests;To chair/3-in-1;To toilet Details for Transfer Assistance: Min cueing to reinforce hand placement.   Ambulation/Gait Ambulation/Gait Assistance: 4: Min guard Ambulation Distance (Feet): 100 Feet Assistive device: Rolling walker Ambulation/Gait Assistance Details: Cues to stay inside RW, safe use of RW, & tall posture.    Overall pt was steady but she reports she feels weak therefore encouraged cont'd use of RW for ambulation at this time to ensure safety.   Gait Pattern: Step-through pattern;Decreased stride length;Trunk flexed General Gait  Details: Pt with no reports of dizziness with ambulation Stairs: No Wheelchair Mobility Wheelchair Mobility: No    Exercises General Exercises - Lower Extremity Long Arc Quad: Both;20 reps;Seated     PT Goals Acute Rehab PT Goals Time For Goal Achievement: 03/07/13 Potential to Achieve Goals: Good Pt will go Sit to Stand: Independently PT Goal: Sit to Stand - Progress: Progressing toward goal Pt will Transfer Bed to Chair/Chair to Bed: Independently Pt will Ambulate: >150 feet;Independently;Other (comment) PT Goal: Ambulate - Progress: Progressing toward goal Pt will Go Up / Down Stairs: 1-2 stairs;with modified independence;with rail(s)  Visit Information  Last PT Received On: 03/01/13 Assistance Needed: +1    Subjective Data  Subjective: "my shingles are hurting me" Patient Stated Goal: Be able to go back home by myself   Cognition  Cognition Arousal/Alertness: Awake/alert Behavior During Therapy: WFL for tasks assessed/performed Overall Cognitive Status: Within Functional Limits for tasks assessed    Balance     End of Session PT - End of Session Equipment Utilized During Treatment: Gait belt Activity Tolerance: Patient tolerated treatment well Patient left: in chair;with call bell/phone within reach     [place], [place] [phone] 03/01/2013

## 2013-03-01 NOTE — Progress Notes (Signed)
ANTICOAGULATION CONSULT NOTE   Pharmacy Consult for coumadin  Indication: atrial fibrillation  Allergies  Allergen Reactions  . Alendronate Sodium     REACTION: GI  . Sulfonamide Derivatives     REACTION: sick    Patient Measurements: Height: 5\' 8"  (172.7 cm) Weight: 111 lb 6.4 oz (50.531 kg) (SCALE C) IBW/kg (Calculated) : 63.9   Vital Signs: Temp: 98.2 F (36.8 C) (06/19 0659) Temp src: Oral (06/19 0659) BP: 99/54 mmHg (06/19 0832) Pulse Rate: 93 (06/19 0832)  Labs:  Recent Labs  02/27/13 2120 02/28/13 0418 02/28/13 1030 02/28/13 1526 03/01/13 0631  HGB 12.2 12.3  --   --   --   HCT 36.5 36.8  --   --   --   PLT 169 169  --   --   --   LABPROT 23.4*  --   --   --  25.2*  INR 2.19*  --   --   --  2.42*  CREATININE 1.62* 1.54*  --   --  1.00  TROPONINI  --  <0.30 <0.30 <0.30  --     Estimated Creatinine Clearance: 30.4 ml/min (by C-G formula based on Cr of 1).  Assessment: 79 yo admitted for SOB and dizziness fatigue and shingle pain. Therapeutic  INR  2.4. No bleeding noted, cbc has been stable.   Goal of Therapy:  INR 2-3 Monitor platelets by anticoagulation protocol: Yes   Plan:  Continue coumadin 5mg  daily  Daily PT/inr  Sheppard Coil PharmD., BCPS Clinical Pharmacist Pager [phone] 03/01/2013 10:39 AM

## 2013-03-01 NOTE — Progress Notes (Signed)
[facility]                                                                                Patient Demographics  [name], is a 79 y.o. female, DOB - September 23, 1921, ZOX:[id number], WJX:[id number]  Admit date - 02/27/2013  Admitting Physician Therisa Doyne, MD  Outpatient Primary MD for the patient is [name], MD  LOS - 2   Chief Complaint  Patient presents with  . Palpitations        Assessment & Plan    1. Generalized weakness due to combination of diarrhea causing dehydration and orthostatic hypotension along with decreased by mouth intake due to recent shingles - should also has evidence of severe protein calorie malnutrition and appears cachectic and weak, continue with maintenance normal saline as she is clearly orthostatic and dehydrated, will add TED stockings along with middle cream as she continues to be orthostatic, increase activity, PT evaluation. Diarrhea has resolved will monitor nutritional input per dietitian.    2. Severe protein calorie malnutrition. Protein supplements per dietitian.    3. Atrial fibrillation .goal will be rate control, Coumadin to be dosed by pharmacy, continue beta blocker as tolerated by blood pressure.continue Cardizem. Per daughter's request cardiology consult called however no acute cardiac issues.  Echogram  - Left ventricle: Abnormal septal motion The cavity size was normal. Wall thickness was normal. Systolic function was normal. The estimated ejection fraction was in the range of 50% to 55%. - Mitral valve: Mild regurgitation. - Left atrium: The atrium was moderately dilated. - Right atrium: The atrium was moderately dilated. - Atrial septum: No defect or patent foramen ovale was identified. - Tricuspid valve: Mild-moderate regurgitation. - Pulmonary arteries: PA peak pressure: 40mm Hg (S).   Lab Results  Component Value Date   INR 2.42* 03/01/2013   INR 2.19* 02/27/2013   INR 2.5* 02/06/2013     4. Acute  renal failure due to dehydration. Continue IV fluids and monitor BMP. Almost completely resolved    5.Shingles rash on the right flank. Rash is completely dry and crusted, she has finished her acyclovir treatment, this rash is 68-month-old. Continue supportive care only. We'll add Neurontin for better pain control.    Code Status: full  Family Communication: daughter  Disposition Plan: TBD   Procedures Echo   Consults  Cardiology   DVT Prophylaxis  Coumadin  Lab Results  Component Value Date   INR 2.42* 03/01/2013   INR 2.19* 02/27/2013   INR 2.5* 02/06/2013     Lab Results  Component Value Date   PLT 169 02/28/2013    Medications  Scheduled Meds: . docusate sodium  100 mg Oral BID  . feeding supplement  1 Container Oral TID BM  . ferrous sulfate  325 mg Oral Q breakfast  . furosemide  20 mg Intravenous Once  . gabapentin  200 mg Oral TID  . ibuprofen  400 mg Oral TID  . levothyroxine  88 mcg Oral QAC breakfast  . metoprolol succinate  25 mg Oral Daily  . midodrine  5 mg Oral BID WC  . pantoprazole  40 mg Oral Daily  . simvastatin  5 mg Oral QPM  .  sodium chloride  3 mL Intravenous Q12H  . warfarin  5 mg Oral q1800  . Warfarin - Pharmacist Dosing Inpatient   Does not apply q1800   Continuous Infusions: . sodium chloride     PRN Meds:.acetaminophen, acetaminophen, alum & mag hydroxide-simeth, HYDROcodone-acetaminophen, ondansetron (ZOFRAN) IV, ondansetron, traMADol  Antibiotics     Anti-infectives   None       Time Spent in minutes   35   Susa Raring K M.D on 03/01/2013 at 10:50 AM  Between 7am to 7pm - Pager - [phone]  After 7pm go to [email or web address] - password [email or web address]  And look for the night coverage person covering for me after hours  [facility] Office  [phone]    Subjective:   [name] today has, No headache, No chest pain, No abdominal pain - No Nausea, No new weakness tingling or numbness, No Cough - SOB.  mild generalized weakness  Objective:   Filed Vitals:   03/01/13 0132 03/01/13 0659 03/01/13 0830 03/01/13 0832  BP: 112/64 139/78 126/57 99/54  Pulse: 83 82 89 93  Temp: 98.3 F (36.8 C) 98.2 F (36.8 C)    TempSrc: Oral Oral    Resp: 18 20    Height:      Weight:  50.531 kg (111 lb 6.4 oz)    SpO2: 98% 98%      Wt Readings from Last 3 Encounters:  03/01/13 50.531 kg (111 lb 6.4 oz)  01/10/13 52.073 kg (114 lb 12.8 oz)  06/05/12 52.98 kg (116 lb 12.8 oz)     Intake/Output Summary (Last 24 hours) at 03/01/13 1050 Last data filed at 03/01/13 0948  Gross per 24 hour  Intake 2061.25 ml  Output   1252 ml  Net 809.25 ml    Exam Awake Alert, Oriented X 3, No new F.N deficits, Normal affect [place].AT,PERRAL Supple Neck,No JVD, No cervical lymphadenopathy appriciated.  Symmetrical Chest wall movement, Good air movement bilaterally, CTAB RRR,No Gallops,Rubs or new Murmurs, No Parasternal Heave +ve B.Sounds, Abd Soft, Non tender, No organomegaly appriciated, No rebound - guarding or rigidity. No Cyanosis, Clubbing or edema, No new Rash or bruise , her shingles rash on her left flank is completely dry and crusted     Data Review   Micro Results No results found for this or any previous visit (from the past 240 hour(s)).  Radiology Reports Dg Chest 2 View  02/27/2013   *RADIOLOGY REPORT*  Clinical Data: Palpitations  CHEST - 2 VIEW  Comparison: Chest radiograph 02/29/2012  Findings: Stable moderate cardiomegaly. Coronary artery stents are visible projecting over the left heart.  Pulmonary vascularity is normal.  Probable atherosclerotic calcification of the right subclavian artery projecting over the right lung apex, when correlated with prior chest CT.  There is biapical pleuroparenchymal scarring, right greater than left, stable.  The lungs are mildly hyperinflated.  No airspace disease, effusion, or pneumothorax.  Diffuse osteopenia of the bones.  Vertebroplasty changes seen at  one level in the lower thoracic spine.  IMPRESSION: Stable cardiomegaly.  Coronary artery stents.  No acute cardiopulmonary disease.   Original Report Authenticated By: Britta Mccreedy, M.D.    [facility]  Recent Labs Lab 02/27/13 2120 02/28/13 0418  WBC 7.1 7.4  HGB 12.2 12.3  HCT 36.5 36.8  PLT 169 169  MCV 95.3 95.6  MCH 31.9 31.9  MCHC 33.4 33.4  RDW 13.7 13.6    Chemistries   Recent Labs Lab 02/27/13 2120 02/28/13 0418 03/01/13 0631  NA  139 140 138  K 4.8 4.1 4.0  CL 103 103 106  CO2 27 29 24   GLUCOSE 87 88 89  BUN 38* 38* 27*  CREATININE 1.62* 1.54* 1.00  CALCIUM 9.3 9.5 8.3*  MG  --  2.2  --   AST  --  20  --   ALT  --  10  --   ALKPHOS  --  44  --   BILITOT  --  0.4  --    ------------------------------------------------------------------------------------------------------------------ estimated creatinine clearance is 30.4 ml/min (by C-G formula based on Cr of 1). ------------------------------------------------------------------------------------------------------------------  Recent Labs  02/28/13 0418  HGBA1C 5.7*   ------------------------------------------------------------------------------------------------------------------ No results found for this basename: CHOL, HDL, LDLCALC, TRIG, CHOLHDL, LDLDIRECT,  in the last 72 hours ------------------------------------------------------------------------------------------------------------------  Recent Labs  02/28/13 0418  TSH 6.749*   ------------------------------------------------------------------------------------------------------------------ No results found for this basename: VITAMINB12, FOLATE, FERRITIN, TIBC, IRON, RETICCTPCT,  in the last 72 hours  Coagulation profile  Recent Labs Lab 02/27/13 2120 03/01/13 0631  INR 2.19* 2.42*    No results found for this basename: DDIMER,  in the last 72 hours  Cardiac Enzymes  Recent Labs Lab 02/28/13 0418 02/28/13 1030 02/28/13 1526   TROPONINI <0.30 <0.30 <0.30   ------------------------------------------------------------------------------------------------------------------ No components found with this basename: POCBNP,

## 2013-03-01 NOTE — ED Provider Notes (Signed)
I have personally seen and examined the patient.  I have discussed the plan of care with the resident.  I have reviewed the documentation on PMH/FH/Soc. History.  I have reviewed the documentation of the resident and agree.  I have reviewed and agree with the ECG interpretation(s) documented by the resident.  Pt stable in the ED but endorses worsening dyspnea.  Family is concerned she could worsen at home.  Will admit for further treatment.   Joya Gaskins, MD 03/01/13 [phone]

## 2013-03-02 DIAGNOSIS — I5043 Acute on chronic combined systolic (congestive) and diastolic (congestive) heart failure: Secondary | ICD-10-CM

## 2013-03-02 LAB — URINE CULTURE
Culture: NO GROWTH
Special Requests: NORMAL

## 2013-03-02 LAB — PROTIME-INR
INR: 2.38 — ABNORMAL HIGH (ref 0.00–1.49)
Prothrombin Time: 24.9 seconds — ABNORMAL HIGH (ref 11.6–15.2)

## 2013-03-02 MED ORDER — SODIUM CHLORIDE 0.9 % IV SOLN
INTRAVENOUS | Status: AC
  Administered 2013-03-02: 12:00:00 via INTRAVENOUS

## 2013-03-02 NOTE — Progress Notes (Signed)
Nicolas Sisler, PTA [phone] 03/02/2013  

## 2013-03-02 NOTE — Progress Notes (Addendum)
[facility]                                                                                Patient Demographics  [name], is a 79 y.o. female, DOB - August 07, 1922, ZOX:[id number], WJX:[id number]  Admit date - 02/27/2013  Admitting Physician Therisa Doyne, MD  Outpatient Primary MD for the patient is [name], MD  LOS - 3   Chief Complaint  Patient presents with  . Palpitations        Assessment & Plan    1. Generalized weakness due to combination of diarrhea causing dehydration and orthostatic hypotension along with decreased by mouth intake due to recent shingles - should also has evidence of severe protein calorie malnutrition and appears cachectic and weak, continue with normal saline as she is clearly orthostatic and dehydrated, will add TED stockings along with midodrene as she continues to be orthostatic, increase activity, PT evaluation. Diarrhea has resolved will monitor nutritional input per dietitian. Note she is currently not symptomatic with orthostatic hypotension. TSH is around 6 will check a random cortisol level.    2. Severe protein calorie malnutrition. Protein supplements per dietitian.    3. Atrial fibrillation .goal will be rate control, Coumadin to be dosed by pharmacy, continue beta blocker as tolerated by blood pressure.continue Cardizem. Per daughter's request cardiology consult called however no acute cardiac issues.  Echogram  - Left ventricle: Abnormal septal motion The cavity size was normal. Wall thickness was normal. Systolic function was normal. The estimated ejection fraction was in the range of 50% to 55%. - Mitral valve: Mild regurgitation. - Left atrium: The atrium was moderately dilated. - Right atrium: The atrium was moderately dilated. - Atrial septum: No defect or patent foramen ovale was identified. - Tricuspid valve: Mild-moderate regurgitation. - Pulmonary arteries: PA peak pressure: 40mm Hg (S).   Lab Results   Component Value Date   INR 2.38* 03/02/2013   INR 2.42* 03/01/2013   INR 2.19* 02/27/2013     4. Acute renal failure due to dehydration. Continue IV fluids and monitor BMP. Almost completely resolved    5.Shingles rash on the right flank. Rash is completely dry and crusted, she has finished her acyclovir treatment, this rash is 79-month-old. Continue supportive care only. We'll add Neurontin for better pain control.    Code Status: full  Family Communication: daughter  Disposition Plan: TBD   Procedures Echo   Consults  Cardiology   DVT Prophylaxis  Coumadin  Lab Results  Component Value Date   INR 2.38* 03/02/2013   INR 2.42* 03/01/2013   INR 2.19* 02/27/2013     Lab Results  Component Value Date   PLT 169 02/28/2013    Medications  Scheduled Meds: . docusate sodium  100 mg Oral BID  . feeding supplement  1 Container Oral TID BM  . ferrous sulfate  325 mg Oral Q breakfast  . gabapentin  200 mg Oral TID  . ibuprofen  400 mg Oral TID  . levothyroxine  88 mcg Oral QAC breakfast  . metoprolol succinate  25 mg Oral Daily  . midodrine  5 mg Oral BID WC  . pantoprazole  40 mg Oral  Daily  . simvastatin  5 mg Oral QPM  . sodium chloride  3 mL Intravenous Q12H  . warfarin  5 mg Oral q1800  . Warfarin - Pharmacist Dosing Inpatient   Does not apply q1800   Continuous Infusions: . sodium chloride     PRN Meds:.acetaminophen, acetaminophen, alum & mag hydroxide-simeth, HYDROcodone-acetaminophen, ondansetron (ZOFRAN) IV, ondansetron, traMADol  Antibiotics     Anti-infectives   None       Time Spent in minutes   35   SINGH,PRASHANT K M.D on 03/02/2013 at 9:41 AM  Between 7am to 7pm - Pager - [phone]  After 7pm go to [email or web address] - password [email or web address]  And look for the night coverage person covering for me after hours  [facility] Office  [phone]    Subjective:   [name] today has, No headache, No chest pain, No abdominal  pain - No Nausea, No new weakness tingling or numbness, No Cough - SOB. mild generalized weakness  Objective:   Filed Vitals:   03/01/13 2224 03/02/13 0602 03/02/13 0817 03/02/13 0818  BP: 98/58 118/59 114/59 88/60  Pulse: 75 72 82 86  Temp: 96.9 F (36.1 C) 97.8 F (36.6 C)    TempSrc: Oral Oral    Resp: 18 19    Height:      Weight:  51.891 kg (114 lb 6.4 oz)    SpO2: 98% 98%      Wt Readings from Last 3 Encounters:  03/02/13 51.891 kg (114 lb 6.4 oz)  01/10/13 52.073 kg (114 lb 12.8 oz)  06/05/12 52.98 kg (116 lb 12.8 oz)     Intake/Output Summary (Last 24 hours) at 03/02/13 0941 Last data filed at 03/02/13 0753  Gross per 24 hour  Intake   1190 ml  Output    350 ml  Net    840 ml    Exam Awake Alert, Oriented X 3, No new F.N deficits, Normal affect [place].AT,PERRAL Supple Neck,No JVD, No cervical lymphadenopathy appriciated.  Symmetrical Chest wall movement, Good air movement bilaterally, CTAB RRR,No Gallops,Rubs or new Murmurs, No Parasternal Heave +ve B.Sounds, Abd Soft, Non tender, No organomegaly appriciated, No rebound - guarding or rigidity. No Cyanosis, Clubbing or edema, No new Rash or bruise , her shingles rash on her left flank is completely dry and crusted     Data Review   Micro Results Recent Results (from the past 240 hour(s))  URINE CULTURE     Status: None   Collection Time    02/28/13  1:29 PM      Result Value Range Status   Specimen Description URINE, CLEAN CATCH   Final   Special Requests NONE   Final   Culture  Setup Time 02/28/2013 17:39   Final   [place] Count 40,000 COLONIES/ML   Final   Culture     Final   Value: Multiple bacterial morphotypes present, none predominant. Suggest appropriate recollection if clinically indicated.   Report Status 03/01/2013 FINAL   Final  URINE CULTURE     Status: None   Collection Time    02/28/13  9:13 PM      Result Value Range Status   Specimen Description URINE, CLEAN CATCH   Final   Special  Requests Normal   Final   Culture  Setup Time 02/28/2013 22:42   Final   Colony Count NO GROWTH   Final   Culture NO GROWTH   Final   Report Status 03/02/2013 FINAL   Final  Radiology Reports Dg Chest 2 View  02/27/2013   *RADIOLOGY REPORT*  Clinical Data: Palpitations  CHEST - 2 VIEW  Comparison: Chest radiograph 02/29/2012  Findings: Stable moderate cardiomegaly. Coronary artery stents are visible projecting over the left heart.  Pulmonary vascularity is normal.  Probable atherosclerotic calcification of the right subclavian artery projecting over the right lung apex, when correlated with prior chest CT.  There is biapical pleuroparenchymal scarring, right greater than left, stable.  The lungs are mildly hyperinflated.  No airspace disease, effusion, or pneumothorax.  Diffuse osteopenia of the bones.  Vertebroplasty changes seen at one level in the lower thoracic spine.  IMPRESSION: Stable cardiomegaly.  Coronary artery stents.  No acute cardiopulmonary disease.   Original Report Authenticated By: Britta Mccreedy, M.D.    CBC  Recent Labs Lab 02/27/13 2120 02/28/13 0418  WBC 7.1 7.4  HGB 12.2 12.3  HCT 36.5 36.8  PLT 169 169  MCV 95.3 95.6  MCH 31.9 31.9  MCHC 33.4 33.4  RDW 13.7 13.6    Chemistries   Recent Labs Lab 02/27/13 2120 02/28/13 0418 03/01/13 0631  NA 139 140 138  K 4.8 4.1 4.0  CL 103 103 106  CO2 27 29 24   GLUCOSE 87 88 89  BUN 38* 38* 27*  CREATININE 1.62* 1.54* 1.00  CALCIUM 9.3 9.5 8.3*  MG  --  2.2  --   AST  --  20  --   ALT  --  10  --   ALKPHOS  --  44  --   BILITOT  --  0.4  --    ------------------------------------------------------------------------------------------------------------------ estimated creatinine clearance is 31.2 ml/min (by C-G formula based on Cr of 1). ------------------------------------------------------------------------------------------------------------------  Recent Labs  02/28/13 0418  HGBA1C 5.7*    ------------------------------------------------------------------------------------------------------------------ No results found for this basename: CHOL, HDL, LDLCALC, TRIG, CHOLHDL, LDLDIRECT,  in the last 72 hours ------------------------------------------------------------------------------------------------------------------  Recent Labs  02/28/13 0418  TSH 6.749*   ------------------------------------------------------------------------------------------------------------------ No results found for this basename: VITAMINB12, FOLATE, FERRITIN, TIBC, IRON, RETICCTPCT,  in the last 72 hours  Coagulation profile  Recent Labs Lab 02/27/13 2120 03/01/13 0631 03/02/13 0535  INR 2.19* 2.42* 2.38*    No results found for this basename: DDIMER,  in the last 72 hours  Cardiac Enzymes  Recent Labs Lab 02/28/13 0418 02/28/13 1030 02/28/13 1526  TROPONINI <0.30 <0.30 <0.30   ------------------------------------------------------------------------------------------------------------------ No components found with this basename: POCBNP,

## 2013-03-02 NOTE — Progress Notes (Signed)
Physical Therapy Treatment Patient Details Name: [name] MRN: [id number] DOB: 1921/10/23 Today's Date: 03/02/2013 Time: 4098-1191 PT Time Calculation (min): 24 min  PT Assessment / Plan / Recommendation Comments on Treatment Session  Pt. making progress toward PT goals with ambulation and stair ascending/descending. Pt. made no c/o dizziness during today's Tx. Pt. Would benefit from continued use of RW at home to ensure stability and independence. Pt. States that she will be returning to daughters house at d/c.    Follow Up Recommendations  Home health PT     Does the patient have the potential to tolerate intense rehabilitation     Barriers to Discharge        Equipment Recommendations  Rolling walker with 5" wheels    Recommendations for Other Services    Frequency Min 3X/week   Plan Discharge plan remains appropriate;Frequency remains appropriate    Precautions / Restrictions Precautions Precautions: Fall Restrictions Weight Bearing Restrictions: No   Pertinent Vitals/Pain No reports of pain today during today's Tx.     Mobility  Bed Mobility Bed Mobility: Supine to Sit;Sitting - Scoot to Edge of Bed;Sit to Supine Supine to Sit: 7: Independent Sitting - Scoot to Edge of Bed: 7: Independent Sit to Supine: 7: Independent Details for Bed Mobility Assistance: Pt. laying supine in bed upon arrival. Transfers Transfers: Sit to Stand;Stand to Sit Sit to Stand: 5: Supervision;With upper extremity assist;From bed Stand to Sit: 5: Supervision;To bed;With upper extremity assist Details for Transfer Assistance: Pt. able to transfer with min cueing. Ambulation/Gait Ambulation/Gait Assistance: min guard with RW progressing to supervision; minA with HHA.  Ambulation Distance (Feet): 200 Feet Assistive device: Rolling walker;1 person hand held assist Ambulation/Gait Assistance Details: Pt. able to ambulate with HHA +minA for balance and safety for 142ft after which, Pt.  c/o of fatigue. Pt. given RW for safety and better ambulation due to fatigue and weakness for next 171ft Pt. More steady and increased independent with RW. Gait Pattern: Step-through pattern;Decreased stride length;Trunk flexed General Gait Details: Pt with no reports of dizziness with ambulation; pt. Required seated rest break after 100'. Stairs: Yes Stairs Assistance: 4: Min assist Stair Management Technique: One rail Left;Alternating pattern;Forwards Number of Stairs: 4    Exercises General Exercises - Lower Extremity Long Arc Quad: Both;10 reps;Seated;AROM Hip ABduction/ADduction: 10 reps;Both;Sidelying;AROM Straight Leg Raises: AROM;10 reps;Supine;Both Toe Raises: AROM;Both;10 reps;Seated Heel Raises: AROM;Both;10 reps;Seated     PT Goals Acute Rehab PT Goals Time For Goal Achievement: 03/07/13 Potential to Achieve Goals: Good Pt will go Sit to Stand: Independently PT Goal: Sit to Stand - Progress: Progressing toward goal Pt will Transfer Bed to Chair/Chair to Bed: Independently Pt will Ambulate: >150 feet;Independently;Other (comment) PT Goal: Ambulate - Progress: Progressing toward goal Pt will Go Up / Down Stairs: 1-2 stairs;with modified independence;with rail(s) PT Goal: Up/Down Stairs - Progress: Partly met  Visit Information  Last PT Received On: 03/02/13 Assistance Needed: +1    Subjective Data  Subjective: "My shingles on my shoulder are still hurting me."   Cognition  Cognition Arousal/Alertness: Awake/alert Behavior During Therapy: WFL for tasks assessed/performed Overall Cognitive Status: Within Functional Limits for tasks assessed    Balance     End of Session PT - End of Session Equipment Utilized During Treatment: Gait belt Activity Tolerance: Patient tolerated treatment well Patient left: in bed;with call bell/phone within reach   GP     Jolyn Nap, SPTA 03/02/2013, 9:47 AM

## 2013-03-02 NOTE — Progress Notes (Signed)
ANTICOAGULATION CONSULT NOTE   Pharmacy Consult for coumadin  Indication: atrial fibrillation  Allergies  Allergen Reactions  . Alendronate Sodium     REACTION: GI  . Sulfonamide Derivatives     REACTION: sick    Patient Measurements: Height: 5\' 8"  (172.7 cm) Weight: 114 lb 6.4 oz (51.891 kg) (Scale C) IBW/kg (Calculated) : 63.9   Vital Signs: Temp: 97.8 F (36.6 C) (06/20 0602) Temp src: Oral (06/20 0602) BP: 88/60 mmHg (06/20 0818) Pulse Rate: 86 (06/20 0818)  Labs:  Recent Labs  02/27/13 2120 02/28/13 0418 02/28/13 1030 02/28/13 1526 03/01/13 0631 03/02/13 0535  HGB 12.2 12.3  --   --   --   --   HCT 36.5 36.8  --   --   --   --   PLT 169 169  --   --   --   --   LABPROT 23.4*  --   --   --  25.2* 24.9*  INR 2.19*  --   --   --  2.42* 2.38*  CREATININE 1.62* 1.54*  --   --  1.00  --   TROPONINI  --  <0.30 <0.30 <0.30  --   --     Estimated Creatinine Clearance: 31.2 ml/min (by C-G formula based on Cr of 1).  Assessment: 79 yo admitted for SOB and dizziness fatigue and shingle pain. INR continues to be at goal (2.3). No bleeding noted, cbc has been stable.   Goal of Therapy:  INR 2-3 Monitor platelets by anticoagulation protocol: Yes   Plan:  Continue coumadin 5mg  daily  INR MWF  Sheppard Coil PharmD., BCPS Clinical Pharmacist Pager [phone] 03/02/2013 8:38 AM

## 2013-03-02 NOTE — Progress Notes (Signed)
In to speak with pt. And daughter, Jamesetta So, about home health services.  After looking at home health agency list, pt. And dtr. Chose [facility].  TC to Lupita Leash, with [facility], to give referral for [facility] RN, HH PT, and home health aide.  Pt. Would also like a 3-N-1.  Will obtain order.  Pt. To dc home tomorrow. Tera Mater, RN, BSN NCM [phone]

## 2013-03-02 NOTE — Progress Notes (Signed)
   SUBJECTIVE:  Patient denies dyspnea or chest pain   PHYSICAL EXAM Filed Vitals:   03/01/13 1150 03/01/13 1414 03/01/13 2224 03/02/13 0602  BP: 124/75 103/52 98/58 118/59  Pulse: 74 75 75 72  Temp: 97.8 F (36.6 C) 97.4 F (36.3 C) 96.9 F (36.1 C) 97.8 F (36.6 C)  TempSrc: Oral Oral Oral Oral  Resp: 20 20 18 19   Height:      Weight:    114 lb 6.4 oz (51.891 kg)  SpO2: 97% 97% 98% 98%   General:  No distress, very frail.  Neck: supple Lungs:  Clear Heart:  Irregular Abdomen:  Positive bowel sounds, no rebound no guarding Extremities:  No edema  LABS: Lab Results  Component Value Date   TROPONINI <0.30 02/28/2013   No results found for this or any previous visit (from the past 24 hour(s)).  Intake/Output Summary (Last 24 hours) at 03/02/13 0710 Last data filed at 03/02/13 0606  Gross per 24 hour  Intake    950 ml  Output    350 ml  Net    600 ml    ASSESSMENT AND PLAN:  Chronic systolic and diastolic HF:   EF on echo this admit reported to be 50%. Patient not volume overloaded on exam. Hold lasix today; resume at 20 mg daily tomorrow with additional 20 mg daily PRN. FU Dr Myrtis Ser 1-2 weeks after DC and check BMET at that time. Atrial fibrillation:  Telemetry reviewed.  Rate is controlled on current meds.  Continue coumadin.  Olga Millers 03/02/2013 7:10 AM

## 2013-03-03 MED ORDER — GABAPENTIN 300 MG PO CAPS: 300.0000 mg | ORAL_CAPSULE | Freq: Three times a day (TID) | ORAL | Status: DC

## 2013-03-03 MED ORDER — MIDODRINE HCL 5 MG PO TABS: 5.0000 mg | ORAL_TABLET | Freq: Two times a day (BID) | ORAL | Status: DC

## 2013-03-03 MED ORDER — SODIUM CHLORIDE 0.9 % IV SOLN
INTRAVENOUS | Status: AC
  Administered 2013-03-03: 11:00:00 via INTRAVENOUS

## 2013-03-03 MED ORDER — METOPROLOL TARTRATE 50 MG PO TABS: 25.0000 mg | ORAL_TABLET | Freq: Two times a day (BID) | ORAL | Status: DC

## 2013-03-03 MED ORDER — HYDROCODONE-ACETAMINOPHEN 5-325 MG PO TABS: 1.0000 | ORAL_TABLET | Freq: Three times a day (TID) | ORAL | Status: DC | PRN

## 2013-03-03 MED ORDER — ENSURE PUDDING PO PUDG: 1.0000 | Freq: Three times a day (TID) | ORAL | Status: DC

## 2013-03-03 NOTE — Progress Notes (Signed)
Frequent rounding given to meet pt's needs, pt resting comfortable, denies any pain, VSS, no distress noticed. We'll continue with POC.

## 2013-03-03 NOTE — Discharge Summary (Addendum)
[facility]                                                                                   [name], is a 79 y.o. female  DOB January 01, 1922  MRN [id number].  Admission date:  02/27/2013  Discharge Date:  03/03/2013  Primary MD  [name] Manns, MD  Admitting Physician  [name] Doyne, MD  Admission Diagnosis  Shortness of breath [786.05] Dehydration [276.51] A-fib [427.31] Acute renal failure [584.9] CHF (congestive heart failure), acute on chronic, systolic [428.23, 428.0]  Discharge Diagnosis     Active Problems:   Shortness of breath   A-fib   Warfarin anticoagulation   Acute renal failure   Dehydration   Shingles   Protein-calorie malnutrition, severe   Acute on chronic combined systolic and diastolic heart failure    Past Medical History  Diagnosis Date  . A-fib     chronic  /  holter 06/2010, no brady, mild tachy  . Tachycardia     holter, 06/2010, no brady, mild  tachy  . CAD (coronary artery disease)     2 DES, [facility], 2004  . Pleural effusion associated with pulmonary infection 03-2007  . Hypothyroid   . Hyperlipidemia   . GERD (gastroesophageal reflux disease)   . Diverticulosis of colon   . Allergy   . Anemia   . Arthritis     osteo  . Hypertension   . COPD (chronic obstructive pulmonary disease)   . Mitral regurgitation     moderate, echo, 06/2010  . Vitamin B deficiency   . Compression fracture of lumbar vertebra 05-2008  . Osteoporosis   . Thrombocytopenia   . SOB (shortness of breath)   . OA (osteoarthritis)   . LBBB (left bundle branch block)   . Hx of colonoscopy   . Warfarin anticoagulation   . Ejection fraction     50% echo, 2008  /  EF  35-40%, echo, 06/2010  . TR (tricuspid regurgitation)     moderate, echo 2008  . Dyslipidemia   . Lung abnormality     Dr Shelle Iron 122/2011 no further w/u needed  . Prolonged QT interval     when on sotolol in past  . Volume overload     intermittant  . Hypertension   . Weight loss      August, 2012  . Nausea     Some nausea with medications, May, 2013,    Past Surgical History  Procedure Laterality Date  . Thyroidectomy  1962  . Tonsillectomy       Recommendations for primary care physician for things to follow:   Follow orthostatic, adjust diuretic dose and beta blocker dose as needed.   Discharge Diagnoses:   Active Problems:   Shortness of breath   A-fib   Warfarin anticoagulation   Acute renal failure   Dehydration   Shingles   Protein-calorie malnutrition, severe   Acute on chronic combined systolic and diastolic heart failure    Discharge Condition:  stable   Diet recommendation: See Discharge Instructions below   Consults Cardiology    History of present illness and  Hospital Course:  Kindly see H&P for history of present illness and admission details, please review complete Labs, Consult reports and Test reports for all details in brief [name], is a 79 y.o. female, patient with history of atrial fibrillation on Coumadin, severe protein calorie malnutrition, chronic mild diastolic heart failure EF 55% this admission on echogram, she was admitted for generalized weakness and complains of palpitations, she was found to be severely dehydrated in acute renal failure and orthostatic, she was treated with discontinuation of her diuretics, IV fluids and ProAmatine along with TED stockings, she was seen by physical therapist and advised to go home with physical therapy which will be ordered along with a walker. She was also seen by cardiology this admission who has signed off on the case.   Patient is now not orthostatic, however I have discontinued her calcium channel blocker and lowered her Lopressor dose as after multiple runs of IV fluids and middle brain she has become non-orthostatic today. I will request her primary care physician and her primary cardiologist to please monitor her blood pressures, orthostatics and fluid status  closely and adjust medications as needed. Of note she has been given  written instructions on fall precautions and orthostasis. She will be moving with her daughter and she will get home physical therapy and RN along with home walker. Note she is on Coumadin I will defer long-term Coumadin use in her on her primary cardiologist and her primary care physician. He is completely symptom free today and walked without any dizziness or symptoms, we'll give her gentle fluid bolus prior to discharge, requested patient's daughter to keep her well hydrated.    Echogram  - Left ventricle: Abnormal septal motion The cavity size was normal. Wall thickness was normal. Systolic function was normal. The estimated ejection fraction was in the range of 50% to 55%. - Mitral valve: Mild regurgitation. - Left atrium: The atrium was moderately dilated. - Right atrium: The atrium was moderately dilated. - Atrial septum: No defect or patent foramen ovale was identified. - Tricuspid valve: Mild-moderate regurgitation. - Pulmonary arteries: PA peak pressure: 40mm Hg (S).    For her severe protein calorie malnutrition protein supplement will be ordered.     Today   Subjective:   [name] today has no headache,no chest abdominal pain,no new weakness tingling or numbness, feels much better wants to go home today.   Objective:   Blood pressure 103/60, pulse 85, temperature 98.2 F (36.8 C), temperature source Oral, resp. rate 20, height 5\' 8"  (1.727 m), weight 53.4 kg (117 lb 11.6 oz), SpO2 98.00%.   Intake/Output Summary (Last 24 hours) at 03/03/13 0950 Last data filed at 03/03/13 0456  Gross per 24 hour  Intake    443 ml  Output    801 ml  Net   -358 ml    Exam Awake Alert, Oriented *3, No new F.N deficits, Normal affect .AT,PERRAL Supple Neck,No JVD, No cervical lymphadenopathy appriciated.  Symmetrical Chest wall movement, Good air movement bilaterally, CTAB RRR,No Gallops,Rubs or new  Murmurs, No Parasternal Heave +ve B.Sounds, Abd Soft, Non tender, No organomegaly appriciated, No rebound -guarding or rigidity. No Cyanosis, Clubbing or edema, No new Rash or bruise, shingles rash on the left flank is completely healed and crusted all over.  Data Review   Major procedures and Radiology Reports - PLEASE review detailed and final reports for all details in brief -     Dg Chest 2 View  02/27/2013   *RADIOLOGY REPORT*  Clinical Data: Palpitations  CHEST - 2 VIEW  Comparison: Chest radiograph 02/29/2012  Findings: Stable moderate cardiomegaly. Coronary artery stents are visible projecting over the left heart.  Pulmonary vascularity is normal.  Probable atherosclerotic calcification of the right subclavian artery projecting over the right lung apex, when correlated with prior chest CT.  There is biapical pleuroparenchymal scarring, right greater than left, stable.  The lungs are mildly hyperinflated.  No airspace disease, effusion, or pneumothorax.  Diffuse osteopenia of the bones.  Vertebroplasty changes seen at one level in the lower thoracic spine.  IMPRESSION: Stable cardiomegaly.  Coronary artery stents.  No acute cardiopulmonary disease.   Original Report Authenticated By: Britta Mccreedy, M.D.    Micro Results      Recent Results (from the past 240 hour(s))  URINE CULTURE     Status: None   Collection Time    02/28/13  1:29 PM      Result Value Range Status   Specimen Description URINE, CLEAN CATCH   Final   Special Requests NONE   Final   Culture  Setup Time 02/28/2013 17:39   Final   Colony Count 40,000 COLONIES/ML   Final   Culture     Final   Value: Multiple bacterial morphotypes present, none predominant. Suggest appropriate recollection if clinically indicated.   Report Status 03/01/2013 FINAL   Final  URINE CULTURE     Status: None   Collection Time    02/28/13  9:13 PM      Result Value Range Status   Specimen Description URINE, CLEAN CATCH   Final   Special  Requests Normal   Final   Culture  Setup Time 02/28/2013 22:42   Final   Colony Count NO GROWTH   Final   Culture NO GROWTH   Final   Report Status 03/02/2013 FINAL   Final     CBC w Diff: Lab Results  Component Value Date   WBC 7.4 02/28/2013   WBC 5.8 05/04/2012   WBC 6.1 04/15/2009   HGB 12.3 02/28/2013   HGB 12.2 05/04/2012   HGB 12.6 04/15/2009   HCT 36.8 02/28/2013   HCT 36.6 05/04/2012   HCT 36.5 04/15/2009   PLT 169 02/28/2013   PLT 117* 05/04/2012   PLT 113* 04/15/2009   LYMPHOPCT 19.4 05/04/2012   LYMPHOPCT 18.8 01/31/2012   LYMPHOPCT 26.6 04/15/2009   MONOPCT 13.6 05/04/2012   MONOPCT 10.6 01/31/2012   MONOPCT 9.8 04/15/2009   EOSPCT 0.9 05/04/2012   EOSPCT 1.2 01/31/2012   EOSPCT 2.2 04/15/2009   BASOPCT 0.7 05/04/2012   BASOPCT 0.7 01/31/2012   BASOPCT 0.6 04/15/2009    CMP: Lab Results  Component Value Date   NA 138 03/01/2013   NA 144 04/15/2009   K 4.0 03/01/2013   K 5.7* 04/15/2009   CL 106 03/01/2013   CL 107 04/15/2009   CO2 24 03/01/2013   CO2 33 04/15/2009   BUN 27* 03/01/2013   BUN 15 04/15/2009   CREATININE 1.00 03/01/2013   CREATININE 0.9 04/15/2009   PROT 6.2 02/28/2013   PROT 6.7 01/28/2009   ALBUMIN 3.4* 02/28/2013   BILITOT 0.4 02/28/2013   BILITOT 0.90 01/28/2009   ALKPHOS 44 02/28/2013   ALKPHOS 51 01/28/2009   AST 20 02/28/2013   AST 32 01/28/2009   ALT 10 02/28/2013  .   Discharge Instructions     Follow with Primary MD [name] Manns, MD in 3 days   Get CBC, CMP,INR checked 3 days  by Primary MD and again as instructed by your Primary MD.  Get Medicines reviewed and adjusted.  Please request your Prim.MD to go over all Hospital Tests and Procedure/Radiological results at the follow up, please get all Hospital records sent to your Prim MD by signing hospital release before you go home.  Activity: As tolerated with Full fall precautions use walker/cane & assistance as needed  You must sit at a stationary position for 5 minutes and do leg extension exercises as taught for 5  minutes before you start walking from a resting position or after getting up from a bed, once you stand up , stand at that spot for 3-5 minutes while holding on to a wall-bed-heavy furniture and then walk only if you are not dizzy, using a  walker at all times, if you still get dizzy sit down, and call for help.   Diet:  Heart healthy, Aspiration precautions.  Check your Weight same time everyday, if you gain over 2 pounds, or you develop in leg swelling, experience more shortness of breath or chest pain, call your Primary MD immediately. Follow Cardiac Low Salt Diet and 1.8 lit/day fluid restriction.  Disposition Home    If you experience worsening of your admission symptoms, develop shortness of breath, life threatening emergency, suicidal or homicidal thoughts you must seek medical attention immediately by calling 911 or calling your MD immediately  if symptoms less severe.  You Must read complete instructions/literature along with all the possible adverse reactions/side effects for all the Medicines you take and that have been prescribed to you. Take any new Medicines after you have completely understood and accpet all the possible adverse reactions/side effects.   Do not drive and provide baby sitting services if your were admitted for syncope or siezures until you have seen by Primary MD or a Neurologist and advised to do so again.  Do not drive when taking Pain medications.    Do not take more than prescribed Pain, Sleep and Anxiety Medications  Special Instructions: If you have smoked or chewed Tobacco  in the last 2 yrs please stop smoking, stop any regular Alcohol  and or any Recreational drug use.  Wear Seat belts while driving.   Please note  You were cared for by a hospitalist during your hospital stay. If you have any questions about your discharge medications or the care you received while you were in the hospital after you are discharged, you can call the unit and asked to  speak with the hospitalist on call if the hospitalist that took care of you is not available. Once you are discharged, your primary care physician will handle any further medical issues. Please note that NO REFILLS for any discharge medications will be authorized once you are discharged, as it is imperative that you return to your primary care physician (or establish a relationship with a primary care physician if you do not have one) for your aftercare needs so that they can reassess your need for medications and monitor your lab values.        Follow-up Information   Follow up with [facility]. (Home health nurse, physical therapy, occupational therapy, and aide)    Contact information:   [phone]      Follow up with [name] Manns, MD. Schedule an appointment as soon as possible for a visit in 3 days.   Contact information:   [address] [place] [address] [place] [address] [phone]  Follow up with Willa Rough, MD. Schedule an appointment as soon as possible for a visit in 1 week.   Contact information:   [address] [place] [address] [phone]         Discharge Medications     Medication List    STOP taking these medications       diltiazem 120 MG 24 hr capsule  Commonly known as:  DILACOR XR     furosemide 20 MG tablet  Commonly known as:  LASIX     metoprolol succinate 50 MG 24 hr tablet  Commonly known as:  TOPROL-XL      TAKE these medications       CALCIUM 600/VITAMIN D 600-400 MG-UNIT per tablet  Generic drug:  Calcium Carbonate-Vitamin D  Take 2 tablets by mouth daily.     cyanocobalamin 1000 MCG/ML injection  Commonly known as:  (VITAMIN B-12)  Inject 1,000 mcg into the muscle every 30 (thirty) days.     vitamin B-12 1000 MCG tablet  Commonly known as:  CYANOCOBALAMIN  Take 1,000 mcg by mouth daily.     feeding supplement Pudg  Take 1 Container by mouth 3 (three) times daily between  meals.     ferrous sulfate 325 (65 FE) MG tablet  TAKE 1 TABLET EVERY DAY     gabapentin 300 MG capsule  Commonly known as:  NEURONTIN  Take 1 capsule (300 mg total) by mouth 3 (three) times daily.     glucosamine-chondroitin 500-400 MG tablet  Take 1 tablet by mouth daily.     HYDROcodone-acetaminophen 5-325 MG per tablet  Commonly known as:  NORCO/VICODIN  Take 1-2 tablets by mouth every 8 (eight) hours as needed.     levothyroxine 88 MCG tablet  Commonly known as:  SYNTHROID, LEVOTHROID  TAKE 1 TABLET (88 MCG TOTAL) BY MOUTH DAILY. **NEEDS OFFICE VISIT**     Lutein 6 MG Caps  Take 1 capsule by mouth daily.     metoprolol 50 MG tablet  Commonly known as:  LOPRESSOR  Take 0.5 tablets (25 mg total) by mouth 2 (two) times daily.     midodrine 5 MG tablet  Commonly known as:  PROAMATINE  Take 1 tablet (5 mg total) by mouth 2 (two) times daily with a meal.     multivitamin tablet  Take 1 tablet by mouth daily.     nitroGLYCERIN 0.4 MG SL tablet  Commonly known as:  NITROSTAT  Place 0.4 mg under the tongue every 5 (five) minutes as needed for chest pain.     omeprazole 20 MG capsule  Commonly known as:  PRILOSEC  TAKE 1 CAPSULE TWICE A DAY * Needs appointment with Dr for additional refills*     potassium chloride 10 MEQ tablet  Commonly known as:  K-DUR  Take 1 tablet (10 mEq total) by mouth daily.     simvastatin 20 MG tablet  Commonly known as:  ZOCOR  Take 5 mg by mouth every evening.     TYLENOL ARTHRITIS PAIN 650 MG CR tablet  Generic drug:  acetaminophen  Take 650 mg by mouth every 8 (eight) hours as needed for pain.     warfarin 5 MG tablet  Commonly known as:  COUMADIN  Take 5 mg by mouth daily.           Total Time in preparing paper work, data evaluation and todays exam - 35 minutes  Susa Raring K M.D on 03/03/2013 at 9:50  AM  [facility] Office  [phone]

## 2013-03-03 NOTE — Progress Notes (Signed)
Patient ID: [name], female   DOB: 02/12/1922, 79 y.o.   MRN: [id number]   SUBJECTIVE:  Weak but no cardiac complaints   PHYSICAL EXAM Filed Vitals:   03/02/13 0944 03/02/13 1353 03/02/13 2046 03/03/13 0449  BP: 121/65 102/57 126/69 119/59  Pulse: 71 80 82 70  Temp: 97 F (36.1 C) 98.7 F (37.1 C) 97.8 F (36.6 C) 98.2 F (36.8 C)  TempSrc: Oral Oral Oral Oral  Resp: 20 20 20 20   Height:      Weight:    117 lb 11.6 oz (53.4 kg)  SpO2: 98% 97% 96% 98%   General:  No distress, very frail.  Lungs:  Clear Heart:  Irregular Abdomen:  Positive bowel sounds, no rebound no guarding Extremities:  No edema  LABS: Lab Results  Component Value Date   TROPONINI <0.30 02/28/2013   Results for orders placed during the hospital encounter of 02/27/13 (from the past 24 hour(s))  CORTISOL     Status: None   Collection Time    03/02/13 10:00 AM      Result Value Range   Cortisol, Plasma 10.6      Intake/Output Summary (Last 24 hours) at 03/03/13 0746 Last data filed at 03/03/13 0456  Gross per 24 hour  Intake    683 ml  Output    801 ml  Net   -118 ml    ASSESSMENT AND PLAN:  Chronic systolic and diastolic HF:   EF 50% on echo   This is better than previous.  I do not suspect any volume overload.  Diuretics held for orthostasis  Atrial fibrillation:  Telemetry reviewed.  Rate is controlled on current meds.  No change in therapy.   Will sign off no acute cardiac issues  Charlton Haws 03/03/2013 7:46 AM

## 2013-03-03 NOTE — Progress Notes (Signed)
IV removed.  Client and daughter verbalized understanding of discharge instructions.  PT has been arranged for client.  Client will be going home with daughter at this time.  Issue related to RX discussed with Dr. Thedore Mins.

## 2013-03-04 NOTE — Progress Notes (Signed)
Pt. Discharged home with daughter. I completed patient/daughter discharge education. Patient's daughter took all patient's belongings including 5 wheel wheelchair. Daughter and patient verbalized understanding of discharge instructions. See assessment documentation for further information.

## 2013-03-05 ENCOUNTER — Encounter: Payer: Self-pay | Admitting: Cardiology

## 2013-03-05 ENCOUNTER — Telehealth: Payer: Self-pay | Admitting: General Practice

## 2013-03-05 NOTE — Telephone Encounter (Signed)
Transitional care call: Discharge diagnosis:  Dehydration, A-fib, Acute renal failure and CHF.   Spoke with daughter.  Daughter denies questions about hospital discharge.  RN reviewed medications with daughter and daughter denies any questions.  All medications are in the home.  Daughter was in a hurry and did not have time to talk but says that she will call if they have any problems.  Daughter says that patient is not really doing very well but did not elaborate.  Appointment with Dr. Milinda Antis on 6/26.

## 2013-03-06 ENCOUNTER — Ambulatory Visit: Payer: Medicare Other | Admitting: Family Medicine

## 2013-03-06 ENCOUNTER — Ambulatory Visit (INDEPENDENT_AMBULATORY_CARE_PROVIDER_SITE_OTHER): Payer: Medicare Other | Admitting: Cardiology

## 2013-03-06 ENCOUNTER — Encounter: Payer: Self-pay | Admitting: Cardiology

## 2013-03-06 VITALS — BP 110/72 | HR 79 | Ht 68.0 in | Wt 119.8 lb

## 2013-03-06 DIAGNOSIS — R0989 Other specified symptoms and signs involving the circulatory and respiratory systems: Secondary | ICD-10-CM

## 2013-03-06 DIAGNOSIS — I951 Orthostatic hypotension: Secondary | ICD-10-CM | POA: Insufficient documentation

## 2013-03-06 DIAGNOSIS — I5043 Acute on chronic combined systolic (congestive) and diastolic (congestive) heart failure: Secondary | ICD-10-CM

## 2013-03-06 DIAGNOSIS — B029 Zoster without complications: Secondary | ICD-10-CM

## 2013-03-06 DIAGNOSIS — I4891 Unspecified atrial fibrillation: Secondary | ICD-10-CM

## 2013-03-06 DIAGNOSIS — R9430 Abnormal result of cardiovascular function study, unspecified: Secondary | ICD-10-CM

## 2013-03-06 DIAGNOSIS — I2510 Atherosclerotic heart disease of native coronary artery without angina pectoris: Secondary | ICD-10-CM

## 2013-03-06 DIAGNOSIS — I340 Nonrheumatic mitral (valve) insufficiency: Secondary | ICD-10-CM

## 2013-03-06 DIAGNOSIS — I059 Rheumatic mitral valve disease, unspecified: Secondary | ICD-10-CM

## 2013-03-06 NOTE — Assessment & Plan Note (Signed)
Her valvular abnormalities are mild. No further workup.

## 2013-03-06 NOTE — Assessment & Plan Note (Addendum)
This is a new recent problem for her. I suspect some of it is related to poor overall intake. Her diuretic was stopped and she is stable. She is on Midodrine. For now the plan will be to continue this. It is possible that this could be stopped over time. She takes it twice a day in the morning and at lunch time.  As part of today's evaluation I spent greater than 25 minutes with the patient. More than half of this time was spent to direct care with her. I spoke with her at length about her problems. In addition I had a long discussion trying to help support her daughter concerning the loss of her husband recently. We also privately talked about the stresses of her currently trying to take care of her mother. I encouraged her to try to take care of herself.

## 2013-03-06 NOTE — Assessment & Plan Note (Signed)
This is no longer an issue. She seems to have some improvement in her LV systolic function. Also she is no longer volume overloaded. Her Lasix was actually stopped during her hospitalization. With her orthostasis, I will not restart the Lasix at this time.

## 2013-03-06 NOTE — Assessment & Plan Note (Signed)
Her ejection fraction currently is better than it was a year ago. No change in therapy.

## 2013-03-06 NOTE — Patient Instructions (Addendum)
**Note De-identified  Obfuscation** Your physician recommends that you continue on your current medications as directed. Please refer to the Current Medication list given to you today.  Your physician recommends that you schedule a follow-up appointment in: 3 months  

## 2013-03-06 NOTE — Assessment & Plan Note (Signed)
Her atrial fib continues. She continues on Coumadin. She still seems to be stable enough to use the Coumadin. If she has any falls, we will stop this

## 2013-03-06 NOTE — Progress Notes (Signed)
Patient ID: [name], female   DOB: 12-18-1921, 79 y.o.   MRN:     [id number]   The patient is seen to followup her overall cardiac status. Over time we have seen some decrease in left ventricular function. She was requiring a diuretic. More recently however she had multiple different symptoms then became dehydrated. She did have some diarrhea.  She was hospitalized. In the hospital her diuretic was stopped. She continued to have orthostasis. She was placed on Midodrine. In addition she had shingles that comes from her back to her mid left anterior chest. The pain is very concerning and severe. It is hard for her to distinguish this pain for many other type of chest pain. She has felt some palpitations.  Patient is here with her daughter as always. Unfortunately her situation is quite difficult. The patient's daughter and her husband both had a cough. Chest x-ray showed that her husband had terrible diffuse metastatic cancer and he died within a few months. In addition the daughter had an abnormal mammogram and required further breast biopsy. Fortunately this was benign.  The patient had a two-dimensional echo on February 28, 2013. Her EF was 50-55%.  Medication changes that occurred in the hospital #1 diltiazem stopped. #2 furosemide stopped. #3 preparation of metoprolol changed. #4 Midodrine added. #5 gabapentin added.  Allergies  Allergen Reactions  . Alendronate Sodium     REACTION: GI  . Sulfonamide Derivatives     REACTION: sick    Current Outpatient Prescriptions  Medication Sig Dispense Refill  . acetaminophen (TYLENOL ARTHRITIS PAIN) 650 MG CR tablet Take 650 mg by mouth every 8 (eight) hours as needed for pain.       . cyanocobalamin (,VITAMIN B-12,) 1000 MCG/ML injection Inject 1,000 mcg into the muscle every 30 (thirty) days.       . feeding supplement (ENSURE) PUDG Take 1 Container by mouth 3 (three) times daily between meals.  150 g  10  . ferrous sulfate 325 (65 FE) MG tablet  TAKE 1 TABLET EVERY DAY  90 tablet  0  . gabapentin (NEURONTIN) 300 MG capsule Take 1 capsule (300 mg total) by mouth 3 (three) times daily.  90 capsule  0  . glucosamine-chondroitin 500-400 MG tablet Take 1 tablet by mouth daily.        [name] HYDROcodone-acetaminophen (NORCO/VICODIN) 5-325 MG per tablet Take 1-2 tablets by mouth every 8 (eight) hours as needed.  30 tablet  0  . levothyroxine (SYNTHROID, LEVOTHROID) 88 MCG tablet TAKE 1 TABLET (88 MCG TOTAL) BY MOUTH DAILY. **NEEDS OFFICE VISIT**  90 tablet  0  . Lutein 6 MG CAPS Take 1 capsule by mouth daily.       . metoprolol (LOPRESSOR) 50 MG tablet Take 0.5 tablets (25 mg total) by mouth 2 (two) times daily.  60 tablet  0  . midodrine (PROAMATINE) 5 MG tablet Take 1 tablet (5 mg total) by mouth 2 (two) times daily with a meal.  30 tablet  0  . Multiple Vitamin (MULTIVITAMIN) tablet Take 1 tablet by mouth daily.       . nitroGLYCERIN (NITROSTAT) 0.4 MG SL tablet Place 0.4 mg under the tongue every 5 (five) minutes as needed for chest pain.       [name] omeprazole (PRILOSEC) 20 MG capsule TAKE 1 CAPSULE TWICE A DAY * Needs appointment with Dr for additional refills*  180 capsule  0  . potassium chloride (K-DUR) 10 MEQ tablet Take 1 tablet (10  mEq total) by mouth daily.  90 tablet  3  . simvastatin (ZOCOR) 20 MG tablet Take 5 mg by mouth every evening.      . vitamin B-12 (CYANOCOBALAMIN) 1000 MCG tablet Take 1,000 mcg by mouth daily.      [name] warfarin (COUMADIN) 5 MG tablet Take 5 mg by mouth daily.       No current facility-administered medications for this visit.    History   Social History  . Marital Status: Widowed    Spouse Name: N/A    Number of Children: N/A  . Years of Education: N/A   Occupational History  . Not on file.   Social History Main Topics  . Smoking status: Former Smoker    Quit date: 09/14/1947  . Smokeless tobacco: Not on file  . Alcohol Use: No  . Drug Use: No  . Sexually Active: Not Currently   Other Topics  Concern  . Not on file   Social History Narrative   Very independent    Lives in [place] by herself           Family History  Problem Relation Age of Onset  . Heart disease Mother 27  . Stroke Father 75  . Heart disease Sister   . Cancer Brother     colon  . Diabetes Brother   . Leukemia Sister     Past Medical History  Diagnosis Date  . A-fib     chronic  /  holter 06/2010, no brady, mild tachy  . Tachycardia     holter, 06/2010, no brady, mild  tachy  . CAD (coronary artery disease)     2 DES, [facility], 2004  . Pleural effusion associated with pulmonary infection 03-2007  . Hypothyroid   . Hyperlipidemia   . GERD (gastroesophageal reflux disease)   . Diverticulosis of colon   . Allergy   . Anemia   . Arthritis     osteo  . Hypertension   . COPD (chronic obstructive pulmonary disease)   . Mitral regurgitation   . Vitamin B deficiency   . Compression fracture of lumbar vertebra 05-2008  . Osteoporosis   . Thrombocytopenia   . SOB (shortness of breath)   . OA (osteoarthritis)   . LBBB (left bundle branch block)   . Hx of colonoscopy   . Warfarin anticoagulation   . Ejection fraction   . TR (tricuspid regurgitation)     moderate, echo 2008  . Dyslipidemia   . Lung abnormality     Dr Shelle Iron 122/2011 no further w/u needed  . Prolonged QT interval     when on sotolol in past  . Volume overload     intermittant  . Hypertension   . Weight loss     August, 2012  . Nausea     Some nausea with medications, May, 2013,    Past Surgical History  Procedure Laterality Date  . Thyroidectomy  1962  . Tonsillectomy      Patient Active Problem List   Diagnosis Date Noted  . Weight loss     Priority: High  . A-fib     Priority: High  . Tachycardia     Priority: High  . CAD (coronary artery disease)     Priority: High  . Mitral regurgitation     Priority: High  . SOB (shortness of breath)     Priority: High  . LBBB (left bundle branch block)     Priority:  High  .  Warfarin anticoagulation     Priority: High  . Ejection fraction     Priority: High  . TR (tricuspid regurgitation)     Priority: High  . Dyslipidemia     Priority: High  . Prolonged QT interval     Priority: High  . Volume overload     Priority: High  . Acute on chronic combined systolic and diastolic heart failure 03/02/2013  . Acute renal failure 02/28/2013  . Dehydration 02/28/2013  . Shingles 02/28/2013  . Protein-calorie malnutrition, severe 02/28/2013  . Nail fungus 01/10/2013  . Productive cough 02/29/2012  . Nausea   . Preop cardiovascular exam   . Left shoulder pain 10/06/2011  . Lumbar pain 10/06/2011  . Hyperlipidemia 07/09/2011  . Vision blurred 07/09/2011  . Hearing loss 07/09/2011  . Lung abnormality   . Coccydynia 12/03/2010  . Nonspecific (abnormal) findings on radiological and other examination of body structure 07/13/2010  . ABNORMAL CHEST XRAY 07/13/2010  . Shortness of breath 06/18/2010  . HYPERTENSION 06/24/2009  . GOUT, UNSPECIFIED 03/20/2009  . GERD 03/18/2009  . THROMBOCYTOPENIA 01/03/2009  . ALLERGIC RHINITIS 12/05/2008  . HOARSENESS 06/07/2008  . MEDIASTINAL LYMPHADENOPATHY 05/13/2008  . Other Diseases of Lung, not Elsewhere Classified 04/18/2008  . VENOUS INSUFFICIENCY 09/13/2007  . FREQUENCY, URINARY 07/19/2007  . VERTIGO, BENIGN PAROXYSMAL POSITION 05/11/2007  . TINNITUS NOS 05/11/2007  . OSTEOPOROSIS NOS 05/11/2007  . HYPOTHYROIDISM 04/11/2007  . ANEMIA, B12 DEFICIENCY 04/11/2007  . HYPOXEMIA 04/11/2007    ROS    Patient denies fever, chills, headache, sweats, rash, change in vision, change in hearing, cough, nausea vomiting, urinary symptoms. All other systems are reviewed and are negative.  PHYSICAL EXAM  Patient is walking with a walker. She is frail. She is oriented to person time and place. Affect is normal. Lungs reveals few scattered rhonchi. Cardiac exam reveals S1 and S2. There no clicks or significant murmurs. The  abdomen is soft. There is no peripheral edema.  Filed Vitals:   03/06/13 1156  BP: 110/72  Pulse: 79  Height: 5\' 8"  (1.727 m)  Weight: 119 lb 12.8 oz (54.341 kg)  SpO2: 99%     ASSESSMENT & PLAN

## 2013-03-06 NOTE — Assessment & Plan Note (Signed)
This has caused her major pain. She is receiving gabapentin.

## 2013-03-06 NOTE — Assessment & Plan Note (Signed)
Historically there is coronary disease. She had a nuclear scan in September, 2013 with no scar or ischemia. Currently she has some chest discomfort that is probably from her shingles. I've chosen not to pursue any further aggressive ischemic workup at this time.

## 2013-03-08 ENCOUNTER — Ambulatory Visit: Payer: Medicare Other

## 2013-03-08 ENCOUNTER — Encounter: Payer: Self-pay | Admitting: Family Medicine

## 2013-03-08 ENCOUNTER — Ambulatory Visit (INDEPENDENT_AMBULATORY_CARE_PROVIDER_SITE_OTHER): Payer: Medicare Other | Admitting: Family Medicine

## 2013-03-08 VITALS — BP 108/58 | HR 74 | Temp 98.7°F | Wt 119.0 lb

## 2013-03-08 DIAGNOSIS — D518 Other vitamin B12 deficiency anemias: Secondary | ICD-10-CM

## 2013-03-08 DIAGNOSIS — E538 Deficiency of other specified B group vitamins: Secondary | ICD-10-CM

## 2013-03-08 DIAGNOSIS — E039 Hypothyroidism, unspecified: Secondary | ICD-10-CM

## 2013-03-08 DIAGNOSIS — I4891 Unspecified atrial fibrillation: Secondary | ICD-10-CM

## 2013-03-08 DIAGNOSIS — I5043 Acute on chronic combined systolic (congestive) and diastolic (congestive) heart failure: Secondary | ICD-10-CM

## 2013-03-08 DIAGNOSIS — I2510 Atherosclerotic heart disease of native coronary artery without angina pectoris: Secondary | ICD-10-CM

## 2013-03-08 DIAGNOSIS — Z5181 Encounter for therapeutic drug level monitoring: Secondary | ICD-10-CM

## 2013-03-08 DIAGNOSIS — Z7901 Long term (current) use of anticoagulants: Secondary | ICD-10-CM

## 2013-03-08 DIAGNOSIS — B029 Zoster without complications: Secondary | ICD-10-CM

## 2013-03-08 LAB — BASIC METABOLIC PANEL
CO2: 29 mEq/L (ref 19–32)
Calcium: 9.3 mg/dL (ref 8.4–10.5)
Creatinine, Ser: 0.9 mg/dL (ref 0.4–1.2)
GFR: 61.03 mL/min (ref >60.00)
Glucose, Bld: 83 mg/dL (ref 70–99)
Sodium: 138 mEq/L (ref 135–145)

## 2013-03-08 LAB — POCT INR: INR: 1.6

## 2013-03-08 MED ORDER — CYANOCOBALAMIN 1000 MCG/ML IJ SOLN
1000.0000 ug | Freq: Once | INTRAMUSCULAR | Status: AC
  Administered 2013-03-08: 1000 ug via INTRAMUSCULAR

## 2013-03-08 MED ORDER — LIDOCAINE 5 % EX PTCH: 1.0000 | MEDICATED_PATCH | CUTANEOUS | Status: DC

## 2013-03-08 NOTE — Patient Instructions (Addendum)
Use the patches on the painful areas.  [facility]  We'll contact you with your lab report. Call back next week to see when [facility] wants to see you back.  Take care.

## 2013-03-09 ENCOUNTER — Encounter: Payer: Self-pay | Admitting: Cardiology

## 2013-03-09 ENCOUNTER — Encounter: Payer: Self-pay | Admitting: *Deleted

## 2013-03-09 NOTE — Progress Notes (Signed)
Admitted with orthostasis, midodrine added and BP meds adjusted. Saw cards in interval.  Has HH set up, needs f/u labs.  No BLE edema.  Trying to add calories with ensure/similar.  Still on coumadin.    Shingles pain continues.  She would like to limit opiates.  Discussed.  L sided chest wall pain, burning.    Due for B12 shot.   Daughter had to have biopsy and daughter's husband died recently.     Prev admission and cards notes reviewed.   Meds, vitals, and allergies reviewed.   ROS: See HPI.  Otherwise, noncontributory.  nad Thin Mmm IRR, not tachy Ctab, no focal dec in BS Ext w/o edema L dermatomal rash noted on chest/back

## 2013-03-09 NOTE — Assessment & Plan Note (Signed)
Per cards  

## 2013-03-09 NOTE — Assessment & Plan Note (Signed)
Mild inc in TSH, will defer recheck to PCP.

## 2013-03-09 NOTE — Assessment & Plan Note (Signed)
Appears euvolemic, check bmet today.

## 2013-03-09 NOTE — Assessment & Plan Note (Signed)
Add on lidocaine patches and f/u prn.  >25 min spent with face to face with patient, >50% counseling and/or coordinating care

## 2013-03-09 NOTE — Assessment & Plan Note (Signed)
Injected today

## 2013-03-12 ENCOUNTER — Other Ambulatory Visit: Payer: Self-pay | Admitting: Internal Medicine

## 2013-03-13 ENCOUNTER — Other Ambulatory Visit: Payer: Self-pay

## 2013-03-14 ENCOUNTER — Telehealth: Payer: Self-pay | Admitting: Cardiology

## 2013-03-14 DIAGNOSIS — I4891 Unspecified atrial fibrillation: Secondary | ICD-10-CM

## 2013-03-14 DIAGNOSIS — I959 Hypotension, unspecified: Secondary | ICD-10-CM

## 2013-03-14 DIAGNOSIS — I509 Heart failure, unspecified: Secondary | ICD-10-CM

## 2013-03-14 DIAGNOSIS — B029 Zoster without complications: Secondary | ICD-10-CM

## 2013-03-14 MED ORDER — MIDODRINE HCL 5 MG PO TABS: 5.0000 mg | ORAL_TABLET | Freq: Two times a day (BID) | ORAL | Status: DC

## 2013-03-14 NOTE — Telephone Encounter (Signed)
New Prob    Requesting new prescription of: MIDODRINE HCL 90 day supply

## 2013-03-14 NOTE — Telephone Encounter (Signed)
A prescription for Midodrine HCL 5 mg sent to [facility] in [place], [place]. Pt's daughter aware.

## 2013-03-15 ENCOUNTER — Other Ambulatory Visit: Payer: Self-pay | Admitting: Cardiology

## 2013-03-15 MED ORDER — METOPROLOL TARTRATE 50 MG PO TABS: 25.0000 mg | ORAL_TABLET | Freq: Two times a day (BID) | ORAL | Status: DC

## 2013-03-19 ENCOUNTER — Ambulatory Visit (INDEPENDENT_AMBULATORY_CARE_PROVIDER_SITE_OTHER): Payer: Medicare Other | Admitting: Family Medicine

## 2013-03-19 DIAGNOSIS — Z5181 Encounter for therapeutic drug level monitoring: Secondary | ICD-10-CM

## 2013-03-19 DIAGNOSIS — I2510 Atherosclerotic heart disease of native coronary artery without angina pectoris: Secondary | ICD-10-CM

## 2013-03-19 DIAGNOSIS — Z7901 Long term (current) use of anticoagulants: Secondary | ICD-10-CM

## 2013-03-23 ENCOUNTER — Telehealth: Payer: Self-pay | Admitting: Cardiology

## 2013-03-23 NOTE — Telephone Encounter (Signed)
New problem   Kristy/[facility] want to know if it's ok to add a Child psychotherapist.Please call [name]

## 2013-03-23 NOTE — Telephone Encounter (Signed)
**Note De-Identified  Obfuscation** [name] is advised that it is ok to add a Child psychotherapist to pts case because the pt and family are considering placing pt in assisted living facility and need guidance and assistance with placement.

## 2013-03-26 ENCOUNTER — Other Ambulatory Visit: Payer: Self-pay | Admitting: Cardiology

## 2013-04-02 ENCOUNTER — Other Ambulatory Visit: Payer: Self-pay

## 2013-04-02 MED ORDER — GABAPENTIN 300 MG PO CAPS: 300.0000 mg | ORAL_CAPSULE | Freq: Three times a day (TID) | ORAL | Status: DC

## 2013-04-02 NOTE — Telephone Encounter (Signed)
Left voicemail on [name] VM letting her know Rx sent to pharmacy and have her mother f/u with Dr. Milinda Antis in about 3 months

## 2013-04-02 NOTE — Telephone Encounter (Signed)
Please refill for 3 mo F/u with me in about 3 mo -thanks, glad she is doing better

## 2013-04-02 NOTE — Telephone Encounter (Signed)
[name],pts daughter left v/m and when pt was discharged from hospital pt was given gabapentin 300 taking one cap tid. Pt is out of med and request refill to [facility]. Jamesetta So wants to know when Dr Milinda Antis wants to reck pt; pt saw Dr Para March 03/08/13 for hospital f/u; pt seens to be doing well. [name] request cb.

## 2013-04-12 ENCOUNTER — Telehealth: Payer: Self-pay | Admitting: Cardiology

## 2013-04-12 NOTE — Telephone Encounter (Signed)
F/u  Calling about a fax that was sent 03/27/13 to dr Myrtis Ser

## 2013-04-12 NOTE — Telephone Encounter (Signed)
Tiffany from [facility] called to inquire about orders for patient that were faxed July 15 for Dr. Myrtis Ser.  [facility] needs signed order for medical social worker which was given verbally on 7/11.  I advised Tiffany that I will send message to [facility], LPN Dr. Henrietta Hoover nurse and to Dr. Myrtis Ser as they are out of the office today.  Tiffany verbalized understanding and agreement with plan of care.

## 2013-04-13 NOTE — Telephone Encounter (Signed)
**Note De-Identified  Obfuscation** I was advised on 04/11/13 by fax and by a staff member with [facility] that this paperwork would not be needed as the family had already arranged having pt put in nursing home. Elmarie Shiley is re faxing document to this office for Dr Myrtis Ser signature b/c she states that a Child psychotherapist did go out to pt's home one time and they need signed form faxed back to them. Tiffany is aware that Dr Myrtis Ser will not be in office until 8/12 and she states it is ok to get signature and fax back at that time.

## 2013-04-16 ENCOUNTER — Ambulatory Visit (INDEPENDENT_AMBULATORY_CARE_PROVIDER_SITE_OTHER): Payer: Medicare Other | Admitting: Family Medicine

## 2013-04-16 ENCOUNTER — Other Ambulatory Visit: Payer: Self-pay | Admitting: *Deleted

## 2013-04-16 DIAGNOSIS — I2510 Atherosclerotic heart disease of native coronary artery without angina pectoris: Secondary | ICD-10-CM

## 2013-04-16 DIAGNOSIS — Z5181 Encounter for therapeutic drug level monitoring: Secondary | ICD-10-CM

## 2013-04-16 DIAGNOSIS — Z7901 Long term (current) use of anticoagulants: Secondary | ICD-10-CM

## 2013-04-16 MED ORDER — POTASSIUM CHLORIDE ER 10 MEQ PO TBCR: 10.0000 meq | EXTENDED_RELEASE_TABLET | Freq: Every day | ORAL | Status: DC

## 2013-04-16 MED ORDER — OMEPRAZOLE 20 MG PO CPDR: DELAYED_RELEASE_CAPSULE | ORAL | Status: DC

## 2013-04-16 MED ORDER — FERROUS SULFATE 325 (65 FE) MG PO TABS: ORAL_TABLET | ORAL | Status: DC

## 2013-04-16 MED ORDER — LEVOTHYROXINE SODIUM 88 MCG PO TABS: ORAL_TABLET | ORAL | Status: DC

## 2013-05-07 ENCOUNTER — Other Ambulatory Visit: Payer: Self-pay | Admitting: *Deleted

## 2013-05-07 MED ORDER — OMEPRAZOLE 20 MG PO CPDR: DELAYED_RELEASE_CAPSULE | ORAL | Status: DC

## 2013-05-07 MED ORDER — FERROUS SULFATE 325 (65 FE) MG PO TABS: ORAL_TABLET | ORAL | Status: DC

## 2013-05-09 ENCOUNTER — Encounter: Payer: Self-pay | Admitting: Family Medicine

## 2013-05-09 ENCOUNTER — Ambulatory Visit (INDEPENDENT_AMBULATORY_CARE_PROVIDER_SITE_OTHER): Payer: Medicare Other | Admitting: Family Medicine

## 2013-05-09 ENCOUNTER — Ambulatory Visit (INDEPENDENT_AMBULATORY_CARE_PROVIDER_SITE_OTHER)
Admission: RE | Admit: 2013-05-09 | Discharge: 2013-05-09 | Disposition: A | Discharge status: Home / Self Care | Payer: Medicare Other | Source: Ambulatory Visit | Attending: Family Medicine | Admitting: Family Medicine

## 2013-05-09 VITALS — BP 100/62 | HR 76 | Temp 97.3°F | Wt 122.0 lb

## 2013-05-09 DIAGNOSIS — M25561 Pain in right knee: Secondary | ICD-10-CM

## 2013-05-09 DIAGNOSIS — M174 Other bilateral secondary osteoarthritis of knee: Secondary | ICD-10-CM

## 2013-05-09 DIAGNOSIS — M25569 Pain in unspecified knee: Secondary | ICD-10-CM

## 2013-05-09 DIAGNOSIS — M25562 Pain in left knee: Secondary | ICD-10-CM

## 2013-05-09 DIAGNOSIS — M175 Other unilateral secondary osteoarthritis of knee: Secondary | ICD-10-CM

## 2013-05-09 NOTE — Progress Notes (Signed)
Nature conservation officer at [facility] [address] [place] [address] Phone: [phone] Fax: [phone]  Date:  05/09/2013   Name:  [name]   DOB:  May 31, 1922   MRN:  [id number] Gender: female Age: 79 y.o.  Primary Physician:  [name], MD  Evaluating MD: [name], MD   Chief Complaint: Left knee pain   History of Present Illness:  [name] is a 79 y.o. pleasant patient who presents with the following:  Pleasant lady with multiple medical problems including AF, LBBB, prolonged QT, CRI, CAD, who presents with L > R knee pain.  Left knee pain, about 2-3 weeks ago. Heard something pop, ice, cream, heat, and nothing has helped. Pain with movement and pain on the medial and lateral joint lines R and left. No prior operations. No fractures.   Patient and family known well to me.   Patient Active Problem List   Diagnosis Date Noted  . Orthostatic hypotension   . Acute on chronic combined systolic and diastolic heart failure 03/02/2013  . Acute renal failure 02/28/2013  . Dehydration 02/28/2013  . Shingles 02/28/2013  . Protein-calorie malnutrition, severe 02/28/2013  . Nail fungus 01/10/2013  . Productive cough 02/29/2012  . Nausea   . Left shoulder pain 10/06/2011  . Lumbar pain 10/06/2011  . Hyperlipidemia 07/09/2011  . Vision blurred 07/09/2011  . Hearing loss 07/09/2011  . Weight loss   . A-fib   . Tachycardia   . CAD (coronary artery disease)   . Mitral regurgitation   . SOB (shortness of breath)   . LBBB (left bundle branch block)   . Warfarin anticoagulation   . Ejection fraction   . TR (tricuspid regurgitation)   . Dyslipidemia   . Lung abnormality   . Prolonged QT interval   . Volume overload   . Coccydynia 12/03/2010  . Nonspecific (abnormal) findings on radiological and other examination of body structure 07/13/2010  . ABNORMAL CHEST XRAY 07/13/2010  . Shortness of breath 06/18/2010  . HYPERTENSION 06/24/2009  .  GOUT, UNSPECIFIED 03/20/2009  . GERD 03/18/2009  . THROMBOCYTOPENIA 01/03/2009  . ALLERGIC RHINITIS 12/05/2008  . HOARSENESS 06/07/2008  . MEDIASTINAL LYMPHADENOPATHY 05/13/2008  . Other Diseases of Lung, not Elsewhere Classified 04/18/2008  . VENOUS INSUFFICIENCY 09/13/2007  . FREQUENCY, URINARY 07/19/2007  . VERTIGO, BENIGN PAROXYSMAL POSITION 05/11/2007  . TINNITUS NOS 05/11/2007  . OSTEOPOROSIS NOS 05/11/2007  . HYPOTHYROIDISM 04/11/2007  . ANEMIA, B12 DEFICIENCY 04/11/2007  . HYPOXEMIA 04/11/2007    Past Medical History  Diagnosis Date  . A-fib     chronic  /  holter 06/2010, no brady, mild tachy  . Tachycardia     holter, 06/2010, no brady, mild  tachy  . CAD (coronary artery disease)     2 DES, [facility], 2004  . Pleural effusion associated with pulmonary infection 03-2007  . Hypothyroid   . Hyperlipidemia   . GERD (gastroesophageal reflux disease)   . Diverticulosis of colon   . Allergy   . Anemia   . Arthritis     osteo  . Hypertension   . COPD (chronic obstructive pulmonary disease)   . Mitral regurgitation   . Vitamin B deficiency   . Compression fracture of lumbar vertebra 05-2008  . Osteoporosis   . Thrombocytopenia   . SOB (shortness of breath)   . OA (osteoarthritis)   . LBBB (left bundle branch block)   . Hx of colonoscopy   . Warfarin anticoagulation   .  Ejection fraction   . TR (tricuspid regurgitation)     moderate, echo 2008  . Dyslipidemia   . Lung abnormality     Dr Shelle Iron 122/2011 no further w/u needed  . Prolonged QT interval     when on sotolol in past  . Volume overload     intermittant  . Hypertension   . Weight loss     August, 2012  . Nausea     Some nausea with medications, May, 2013,  . Orthostatic hypotension     June, 2014  . Shingles     Past Surgical History  Procedure Laterality Date  . Thyroidectomy  1962  . Tonsillectomy      History   Social History  . Marital Status: Widowed    Spouse Name: N/A    Number of  Children: N/A  . Years of Education: N/A   Occupational History  . Not on file.   Social History Main Topics  . Smoking status: Former Smoker    Quit date: 09/14/1947  . Smokeless tobacco: Not on file  . Alcohol Use: No  . Drug Use: No  . Sexual Activity: Not Currently   Other Topics Concern  . Not on file   Social History Narrative   Very independent    Lives in [place] by herself           Family History  Problem Relation Age of Onset  . Heart disease Mother 32  . Stroke Father 23  . Heart disease Sister   . Cancer Brother     colon  . Diabetes Brother   . Leukemia Sister     Allergies  Allergen Reactions  . Alendronate Sodium     REACTION: GI  . Sulfonamide Derivatives     REACTION: sick    Medication list has been reviewed and updated.  Outpatient Prescriptions Prior to Visit  Medication Sig Dispense Refill  . acetaminophen (TYLENOL ARTHRITIS PAIN) 650 MG CR tablet Take 650 mg by mouth every 8 (eight) hours as needed for pain.       . cyanocobalamin (,VITAMIN B-12,) 1000 MCG/ML injection Inject 1,000 mcg into the muscle every 30 (thirty) days.       . feeding supplement (ENSURE) PUDG Take 1 Container by mouth 3 (three) times daily between meals.  150 g  10  . ferrous sulfate 325 (65 FE) MG tablet TAKE 1 TABLET EVERY DAY  90 tablet  0  . gabapentin (NEURONTIN) 300 MG capsule Take 1 capsule (300 mg total) by mouth 3 (three) times daily.  90 capsule  2  . glucosamine-chondroitin 500-400 MG tablet Take 1 tablet by mouth daily.        [name] levothyroxine (SYNTHROID, LEVOTHROID) 88 MCG tablet TAKE 1 TABLET (88 MCG TOTAL) BY MOUTH DAILY. **NEEDS OFFICE VISIT**  90 tablet  0  . Lutein 6 MG CAPS Take 1 capsule by mouth daily.       . metoprolol (LOPRESSOR) 50 MG tablet Take 0.5 tablets (25 mg total) by mouth 2 (two) times daily.  60 tablet  6  . midodrine (PROAMATINE) 5 MG tablet TAKE 1 TABLET BY MOUTH 2 TIMES A DAY WITH A MEAL  90 tablet  6  . Multiple Vitamin  (MULTIVITAMIN) tablet Take 1 tablet by mouth daily.       . nitroGLYCERIN (NITROSTAT) 0.4 MG SL tablet Place 0.4 mg under the tongue every 5 (five) minutes as needed for chest pain.       [name]  omeprazole (PRILOSEC) 20 MG capsule TAKE 1 CAPSULE TWICE A DAY  180 capsule  0  . potassium chloride (K-DUR) 10 MEQ tablet Take 1 tablet (10 mEq total) by mouth daily.  90 tablet  0  . simvastatin (ZOCOR) 20 MG tablet Take 5 mg by mouth every evening.      . vitamin B-12 (CYANOCOBALAMIN) 1000 MCG tablet Take 1,000 mcg by mouth daily.      [name] warfarin (COUMADIN) 5 MG tablet Take 5 mg by mouth daily.      [name] HYDROcodone-acetaminophen (NORCO/VICODIN) 5-325 MG per tablet Take 1-2 tablets by mouth every 8 (eight) hours as needed.  30 tablet  0  . lidocaine (LIDODERM) 5 % Place 1 patch onto the skin daily. Remove & Discard patch within 12 hours or as directed by MD  30 patch  2   No facility-administered medications prior to visit.    Review of Systems:   GEN: No fevers, chills. Nontoxic. Primarily MSK c/o today. MSK: Detailed in the HPI GI: tolerating PO intake without difficulty Neuro: some post-shingles pain in the back Otherwise the pertinent positives of the ROS are noted above.    Physical Examination: BP 100/62  Pulse 76  Temp(Src) 97.3 F (36.3 C)  Wt 122 lb (55.339 kg)  BMI 18.55 kg/m2  Ideal Body Weight:     GEN: WDWN, NAD, Non-toxic, Alert & Oriented x 3 HEENT: Atraumatic, Normocephalic.  Ears and Nose: No external deformity. EXTR: No clubbing/cyanosis/tr edema PSYCH: Normally interactive. Conversant. Not depressed or anxious appearing.  Calm demeanor.   Knee:  b Gait: Normal heel toe pattern, mild antalgia ROM: 0-120 Effusion: neg Echymosis or edema: none Patellar tendon NT Painful PLICA: neg Patellar grind: negative Medial and lateral patellar facet loading: negative medial and lateral joint lines: B medial and lateral joint lines ttp Mcmurray's neg Flexion-pinch neg Varus  and valgus stress: stable Lachman: neg Ant and Post drawer: neg Hip abduction, IR, ER: WNL Hip flexion str: 5/5 Hip abd: 5/5 Quad: 5/5 VMO atrophy: mild Hamstring concentric and eccentric: 5/5   Dg Knee Ap/lat W/sunrise Left  05/09/2013   *RADIOLOGY REPORT*  Clinical Data: Left knee pain  DG KNEE - 3 VIEWS  Comparison: None.  Findings: Three views of the left knee submitted.  No acute fracture or subluxation.  Small joint effusion.  There is diffuse osteopenia.  Mild spurring of the lateral tibial plateau.  Mild narrowing of medial joint compartment.  Narrowing of patellofemoral joint space.  Atherosclerotic calcifications of femoral and popliteal artery.  IMPRESSION: No acute fracture or subluxation.  Diffuse osteopenia. Degenerative changes as described above.   Original Report Authenticated By: Natasha Mead, M.D.    Assessment and Plan:  Bilateral knee pain  Left knee pain - Plan: DG Knee AP/LAT W/Sunrise Left  Other bilateral secondary osteoarthritis of knee  Mild oa on film, better than expected for age. But history and exam most c/w b oa exacerbation.  She also requests a DNR to be completed, so I will cc Dr. Milinda Antis.  Knee Injection, LEFT Patient verbally consented to procedure. Risks (including potential rare risk of infection), benefits, and alternatives explained. Sterilely prepped with Chloraprep. Ethyl cholride used for anesthesia. 5 cc Lidocaine 1% mixed with 1 cc of Depo-Medrol 40 mg injected using the anterolateral approach without difficulty. No complications with procedure and tolerated well. Patient had decreased pain post-injection.   Knee Injection, RIGHT Patient verbally consented to procedure. Risks (including potential rare risk of infection), benefits, and  alternatives explained. Sterilely prepped with Chloraprep. Ethyl cholride used for anesthesia. 5 cc Lidocaine 1% mixed with 1 cc of Depo-Medrol 40 mg injected using the anteromedial approach without difficulty. No  complications with procedure and tolerated well. Patient had decreased pain post-injection.   Orders Today:  Orders Placed This Encounter  Procedures  . DG Knee AP/LAT W/Sunrise Left    Standing Status: Future     Number of Occurrences: 1     Standing Expiration Date: 07/09/2014    Order Specific Question:  Reason for exam:    Answer:  knee pain    Order Specific Question:  Preferred imaging location?    Answer:  [facility]    Updated Medication List: (Includes new medications, updates to list, dose adjustments) No orders of the defined types were placed in this encounter.    Medications Discontinued: Medications Discontinued During This Encounter  Medication Reason  . HYDROcodone-acetaminophen (NORCO/VICODIN) 5-325 MG per tablet Error  . lidocaine (LIDODERM) 5 % Error      Signed, Vernisha Bacote T. Knox Cervi, MD 05/09/2013 12:32 PM

## 2013-05-15 ENCOUNTER — Other Ambulatory Visit: Payer: Self-pay | Admitting: Family Medicine

## 2013-05-15 MED ORDER — WARFARIN SODIUM 5 MG PO TABS: 5.0000 mg | ORAL_TABLET | Freq: Every day | ORAL | Status: DC

## 2013-05-21 ENCOUNTER — Ambulatory Visit (INDEPENDENT_AMBULATORY_CARE_PROVIDER_SITE_OTHER): Payer: Medicare Other | Admitting: Family Medicine

## 2013-05-21 DIAGNOSIS — Z5181 Encounter for therapeutic drug level monitoring: Secondary | ICD-10-CM

## 2013-05-21 DIAGNOSIS — Z7901 Long term (current) use of anticoagulants: Secondary | ICD-10-CM

## 2013-05-21 DIAGNOSIS — I2510 Atherosclerotic heart disease of native coronary artery without angina pectoris: Secondary | ICD-10-CM

## 2013-05-21 LAB — POCT INR: INR: 1.8

## 2013-05-25 DIAGNOSIS — I4891 Unspecified atrial fibrillation: Secondary | ICD-10-CM

## 2013-05-25 DIAGNOSIS — I959 Hypotension, unspecified: Secondary | ICD-10-CM

## 2013-05-25 DIAGNOSIS — B029 Zoster without complications: Secondary | ICD-10-CM

## 2013-05-25 DIAGNOSIS — I509 Heart failure, unspecified: Secondary | ICD-10-CM

## 2013-06-01 ENCOUNTER — Other Ambulatory Visit: Payer: Self-pay | Admitting: *Deleted

## 2013-06-01 ENCOUNTER — Ambulatory Visit: Payer: Medicare Other | Admitting: Cardiology

## 2013-06-01 MED ORDER — LEVOTHYROXINE SODIUM 88 MCG PO TABS: ORAL_TABLET | ORAL | Status: DC

## 2013-06-01 MED ORDER — GABAPENTIN 300 MG PO CAPS: 300.0000 mg | ORAL_CAPSULE | Freq: Three times a day (TID) | ORAL | Status: DC

## 2013-06-01 NOTE — Telephone Encounter (Signed)
Please refill for 6 months 

## 2013-06-01 NOTE — Telephone Encounter (Signed)
done

## 2013-06-06 ENCOUNTER — Ambulatory Visit: Payer: Medicare Other | Admitting: Cardiology

## 2013-06-08 ENCOUNTER — Encounter: Payer: Self-pay | Admitting: Cardiology

## 2013-06-08 ENCOUNTER — Ambulatory Visit (INDEPENDENT_AMBULATORY_CARE_PROVIDER_SITE_OTHER): Payer: Medicare Other | Admitting: Cardiology

## 2013-06-08 VITALS — BP 117/73 | HR 72 | Wt 122.0 lb

## 2013-06-08 DIAGNOSIS — R0602 Shortness of breath: Secondary | ICD-10-CM

## 2013-06-08 DIAGNOSIS — Z7901 Long term (current) use of anticoagulants: Secondary | ICD-10-CM

## 2013-06-08 DIAGNOSIS — I951 Orthostatic hypotension: Secondary | ICD-10-CM

## 2013-06-08 DIAGNOSIS — E8770 Fluid overload, unspecified: Secondary | ICD-10-CM

## 2013-06-08 DIAGNOSIS — R000 Tachycardia, unspecified: Secondary | ICD-10-CM

## 2013-06-08 DIAGNOSIS — E8779 Other fluid overload: Secondary | ICD-10-CM

## 2013-06-08 DIAGNOSIS — R634 Abnormal weight loss: Secondary | ICD-10-CM

## 2013-06-08 MED ORDER — FUROSEMIDE 20 MG PO TABS: 20.0000 mg | ORAL_TABLET | ORAL | Status: DC | PRN

## 2013-06-08 NOTE — Assessment & Plan Note (Signed)
Coumadin will continue for her atrial fibrillation.

## 2013-06-08 NOTE — Assessment & Plan Note (Signed)
Now the patient is in assisted living she is gaining true body weight. She also is developing some mild volume overload.

## 2013-06-08 NOTE — Progress Notes (Signed)
HPI  Patient is seen today for followup atrial fibrillation and decreased left ventricular function and orthostasis and now mild volume overload. Patient is 69. She's here with her daughter. She has now been moved into an assisted living facility and she stable. It is very close to her daughter. She's not having any chest pain. In her current situation she is eating much better. She's gaining true body weight. She also was increasing volume. In the past she has required a diuretic. This was stopped during a period when she clearly became dehydrated. Now she does have some mild shortness of breath and some mild edema.  Allergies  Allergen Reactions  . Alendronate Sodium     REACTION: GI  . Sulfonamide Derivatives     REACTION: sick    Current Outpatient Prescriptions  Medication Sig Dispense Refill  . acetaminophen (TYLENOL ARTHRITIS PAIN) 650 MG CR tablet Take 650 mg by mouth every 8 (eight) hours as needed for pain.       . cyanocobalamin (,VITAMIN B-12,) 1000 MCG/ML injection Inject 1,000 mcg into the muscle every 30 (thirty) days.       . feeding supplement (ENSURE) PUDG Take 1 Container by mouth 3 (three) times daily between meals.  150 g  10  . ferrous sulfate 325 (65 FE) MG tablet TAKE 1 TABLET EVERY DAY  90 tablet  0  . gabapentin (NEURONTIN) 300 MG capsule Take 1 capsule (300 mg total) by mouth 3 (three) times daily.  90 capsule  5  . glucosamine-chondroitin 500-400 MG tablet Take 1 tablet by mouth daily.        [name] levothyroxine (SYNTHROID, LEVOTHROID) 88 MCG tablet TAKE 1 TABLET (88 MCG TOTAL) BY MOUTH DAILY. **NEEDS OFFICE VISIT**  90 tablet  0  . Lutein 6 MG CAPS Take 1 capsule by mouth daily.       . metoprolol (LOPRESSOR) 50 MG tablet Take 0.5 tablets (25 mg total) by mouth 2 (two) times daily.  60 tablet  6  . midodrine (PROAMATINE) 5 MG tablet TAKE 1 TABLET BY MOUTH 2 TIMES A DAY WITH A MEAL  90 tablet  6  . Multiple Vitamin (MULTIVITAMIN) tablet Take 1 tablet by mouth daily.        . nitroGLYCERIN (NITROSTAT) 0.4 MG SL tablet Place 0.4 mg under the tongue every 5 (five) minutes as needed for chest pain.       [name] omeprazole (PRILOSEC) 20 MG capsule TAKE 1 CAPSULE TWICE A DAY  180 capsule  0  . potassium chloride (K-DUR) 10 MEQ tablet Take 1 tablet (10 mEq total) by mouth daily.  90 tablet  0  . simvastatin (ZOCOR) 20 MG tablet Take 5 mg by mouth every evening.      . vitamin B-12 (CYANOCOBALAMIN) 1000 MCG tablet Take 1,000 mcg by mouth daily.      [name] warfarin (COUMADIN) 5 MG tablet Take 1 tablet (5 mg total) by mouth daily.  90 tablet  0   No current facility-administered medications for this visit.    History   Social History  . Marital Status: Widowed    Spouse Name: N/A    Number of Children: N/A  . Years of Education: N/A   Occupational History  . Not on file.   Social History Main Topics  . Smoking status: Former Smoker    Quit date: 09/14/1947  . Smokeless tobacco: Not on file  . Alcohol Use: No  . Drug Use: No  . Sexual Activity:  Not Currently   Other Topics Concern  . Not on file   Social History Narrative   Very independent    Lives in [place] by herself           Family History  Problem Relation Age of Onset  . Heart disease Mother 65  . Stroke Father 57  . Heart disease Sister   . Cancer Brother     colon  . Diabetes Brother   . Leukemia Sister     Past Medical History  Diagnosis Date  . A-fib     chronic  /  holter 06/2010, no brady, mild tachy  . Tachycardia     holter, 06/2010, no brady, mild  tachy  . CAD (coronary artery disease)     2 DES, [facility], 2004  . Pleural effusion associated with pulmonary infection 03-2007  . Hypothyroid   . Hyperlipidemia   . GERD (gastroesophageal reflux disease)   . Diverticulosis of colon   . Allergy   . Anemia   . Arthritis     osteo  . Hypertension   . COPD (chronic obstructive pulmonary disease)   . Mitral regurgitation   . Vitamin B deficiency   . Compression fracture of  lumbar vertebra 05-2008  . Osteoporosis   . Thrombocytopenia   . SOB (shortness of breath)   . OA (osteoarthritis)   . LBBB (left bundle branch block)   . Hx of colonoscopy   . Warfarin anticoagulation   . Ejection fraction   . TR (tricuspid regurgitation)     moderate, echo 2008  . Dyslipidemia   . Lung abnormality     Dr Shelle Iron 122/2011 no further w/u needed  . Prolonged QT interval     when on sotolol in past  . Volume overload     intermittant  . Hypertension   . Weight loss     August, 2012  . Nausea     Some nausea with medications, May, 2013,  . Orthostatic hypotension     June, 2014  . Shingles     Past Surgical History  Procedure Laterality Date  . Thyroidectomy  1962  . Tonsillectomy      Patient Active Problem List   Diagnosis Date Noted  . Weight loss     Priority: High  . A-fib     Priority: High  . Tachycardia     Priority: High  . CAD (coronary artery disease)     Priority: High  . Mitral regurgitation     Priority: High  . SOB (shortness of breath)     Priority: High  . LBBB (left bundle branch block)     Priority: High  . Warfarin anticoagulation     Priority: High  . Ejection fraction     Priority: High  . TR (tricuspid regurgitation)     Priority: High  . Dyslipidemia     Priority: High  . Prolonged QT interval     Priority: High  . Volume overload     Priority: High  . Orthostatic hypotension   . Acute on chronic combined systolic and diastolic heart failure 03/02/2013  . Acute renal failure 02/28/2013  . Dehydration 02/28/2013  . Shingles 02/28/2013  . Protein-calorie malnutrition, severe 02/28/2013  . Nail fungus 01/10/2013  . Productive cough 02/29/2012  . Nausea   . Left shoulder pain 10/06/2011  . Lumbar pain 10/06/2011  . Hyperlipidemia 07/09/2011  . Vision blurred 07/09/2011  . Hearing loss 07/09/2011  .  Lung abnormality   . Coccydynia 12/03/2010  . Nonspecific (abnormal) findings on radiological and other  examination of body structure 07/13/2010  . ABNORMAL CHEST XRAY 07/13/2010  . Shortness of breath 06/18/2010  . HYPERTENSION 06/24/2009  . GOUT, UNSPECIFIED 03/20/2009  . GERD 03/18/2009  . THROMBOCYTOPENIA 01/03/2009  . ALLERGIC RHINITIS 12/05/2008  . HOARSENESS 06/07/2008  . MEDIASTINAL LYMPHADENOPATHY 05/13/2008  . Other Diseases of Lung, not Elsewhere Classified 04/18/2008  . VENOUS INSUFFICIENCY 09/13/2007  . FREQUENCY, URINARY 07/19/2007  . VERTIGO, BENIGN PAROXYSMAL POSITION 05/11/2007  . TINNITUS NOS 05/11/2007  . OSTEOPOROSIS NOS 05/11/2007  . HYPOTHYROIDISM 04/11/2007  . ANEMIA, B12 DEFICIENCY 04/11/2007  . HYPOXEMIA 04/11/2007    ROS   Patient denies fever, chills, headache, sweats, rash, change in vision, change in hearing, chest pain, nausea vomiting, urinary symptoms. All other systems are reviewed and are negative.  PHYSICAL EXAM  Patient is oriented to person time and place. Affect is normal. She's here with her daughter. There is no jugulovenous tension. Lungs reveal a few scattered rhonchi. Cardiac exam reveals S1 and S2. The abdomen is soft. She does have trace to 1+ peripheral edema. There no musculoskeletal deformities. There are no skin rashes.    Filed Vitals:   06/08/13 1057  BP: 117/73  Pulse: 72  Weight: 122 lb (55.339 kg)    ASSESSMENT & PLAN

## 2013-06-08 NOTE — Patient Instructions (Addendum)
Please weigh yourself first thing every morning (after urinating). If your weight is 121 lbs or greater take Lasix 20 mg.   Your physician recommends that you take 1 tablet of Metoprolol in the morning and 1 tablet in the evening and take 1 tablet of Midodrine with or just before breakfast and take 1 tablet with or just before lunch time.  Your physician recommends that you cut back on Gatorade   Your physician recommends that you schedule a follow-up appointment in: 3 months

## 2013-06-08 NOTE — Assessment & Plan Note (Addendum)
She does have chronic shortness of breath. In addition I do believe that she is now having a little more shortness of breath related to her volume status. We will be cutting back her Gatorade. We will use diuretics at away to 121 pounds or higher

## 2013-06-08 NOTE — Assessment & Plan Note (Addendum)
The patient's diuretics have been stopped when she was dehydrated with poor intake. Her intake is now much better. She will start recording her morning weights again. She has been weighing herself. We have decided that a weight of 121 pounds her higher will mean that she will take 20 mg of Lasix. Otherwise she will not take it. If she continues to gain true body weight we will have to raise the point in which she takes her diuretic. It turns out that the patient also has been drinking Gatorade because of dehydration when she was first in the hospital. We will back off on this now that her volume status is stabilized.

## 2013-06-08 NOTE — Assessment & Plan Note (Signed)
Because of orthostatic hypotension she is on an unusual combination of medicines. I do want her to continue her midodrine. She is to take a tablet in the morning before breakfast and a tablet before lunch.  As part of today's evaluation I spent greater than 25 minutes with a total care. More than half of the 25 minutes was spent with direct contact with the patient and her daughter talking about her fluids in her weight and her medications.

## 2013-06-08 NOTE — Assessment & Plan Note (Signed)
The patient is on a low dose of a beta blocker to help with her rate. This med is not being used for blood pressure.

## 2013-06-18 ENCOUNTER — Ambulatory Visit (INDEPENDENT_AMBULATORY_CARE_PROVIDER_SITE_OTHER): Payer: Medicare Other | Admitting: Family Medicine

## 2013-06-18 DIAGNOSIS — Z7901 Long term (current) use of anticoagulants: Secondary | ICD-10-CM

## 2013-06-18 DIAGNOSIS — I2510 Atherosclerotic heart disease of native coronary artery without angina pectoris: Secondary | ICD-10-CM

## 2013-06-18 DIAGNOSIS — Z5181 Encounter for therapeutic drug level monitoring: Secondary | ICD-10-CM

## 2013-06-18 LAB — POCT INR: INR: 2.5

## 2013-07-03 ENCOUNTER — Ambulatory Visit (INDEPENDENT_AMBULATORY_CARE_PROVIDER_SITE_OTHER)
Admission: RE | Admit: 2013-07-03 | Discharge: 2013-07-03 | Disposition: A | Discharge status: Home / Self Care | Payer: Medicare Other | Source: Ambulatory Visit | Attending: Family Medicine | Admitting: Family Medicine

## 2013-07-03 ENCOUNTER — Ambulatory Visit (INDEPENDENT_AMBULATORY_CARE_PROVIDER_SITE_OTHER): Payer: Medicare Other | Admitting: Family Medicine

## 2013-07-03 ENCOUNTER — Encounter: Payer: Self-pay | Admitting: Family Medicine

## 2013-07-03 VITALS — BP 98/62 | HR 94 | Temp 98.2°F | Ht 68.0 in | Wt 123.2 lb

## 2013-07-03 DIAGNOSIS — J209 Acute bronchitis, unspecified: Secondary | ICD-10-CM

## 2013-07-03 DIAGNOSIS — R05 Cough: Secondary | ICD-10-CM

## 2013-07-03 DIAGNOSIS — I10 Essential (primary) hypertension: Secondary | ICD-10-CM

## 2013-07-03 MED ORDER — CEFUROXIME AXETIL 500 MG PO TABS: 500.0000 mg | ORAL_TABLET | Freq: Two times a day (BID) | ORAL | Status: DC

## 2013-07-03 NOTE — Progress Notes (Signed)
Subjective:    Patient ID: [name], female    DOB: 03-06-1922, 79 y.o.   MRN: [id number]  HPI Here for f/u - but is sick with uri symptoms  Symptoms started on wed/ Thursday - was exp to someone who was sick and then she got sick  Some runny nose and sore throat (not severe)  Coughing up some phlegm that is yellow  Wheezes at times  Pulse ox 97% today   One day she did have a fever - 100 ? Took tylenol  Was stubborn and did not want to come in- daughter is upset with her    Per daughter - on tussin DM and this may cause her to hallucinate (could see people coming in her bedroom )  That stopped after stopping medicine - fine now without any confusion  [facility]- meals are brought to her and she is eating much better  Patient Active Problem List   Diagnosis Date Noted  . Cough 07/03/2013  . Acute bronchitis 07/03/2013  . Orthostatic hypotension   . Acute on chronic combined systolic and diastolic heart failure 03/02/2013  . Acute renal failure 02/28/2013  . Dehydration 02/28/2013  . Shingles 02/28/2013  . Protein-calorie malnutrition, severe 02/28/2013  . Nail fungus 01/10/2013  . Productive cough 02/29/2012  . Nausea   . Left shoulder pain 10/06/2011  . Lumbar pain 10/06/2011  . Hyperlipidemia 07/09/2011  . Vision blurred 07/09/2011  . Hearing loss 07/09/2011  . Weight loss   . A-fib   . Tachycardia   . CAD (coronary artery disease)   . Mitral regurgitation   . SOB (shortness of breath)   . LBBB (left bundle branch block)   . Warfarin anticoagulation   . Ejection fraction   . TR (tricuspid regurgitation)   . Dyslipidemia   . Lung abnormality   . Prolonged QT interval   . Volume overload   . Coccydynia 12/03/2010  . Nonspecific (abnormal) findings on radiological and other examination of body structure 07/13/2010  . ABNORMAL CHEST XRAY 07/13/2010  . Shortness of breath 06/18/2010  . HYPERTENSION 06/24/2009  . GOUT, UNSPECIFIED 03/20/2009   . GERD 03/18/2009  . THROMBOCYTOPENIA 01/03/2009  . ALLERGIC RHINITIS 12/05/2008  . HOARSENESS 06/07/2008  . MEDIASTINAL LYMPHADENOPATHY 05/13/2008  . Other Diseases of Lung, not Elsewhere Classified 04/18/2008  . VENOUS INSUFFICIENCY 09/13/2007  . FREQUENCY, URINARY 07/19/2007  . VERTIGO, BENIGN PAROXYSMAL POSITION 05/11/2007  . TINNITUS NOS 05/11/2007  . OSTEOPOROSIS NOS 05/11/2007  . HYPOTHYROIDISM 04/11/2007  . ANEMIA, B12 DEFICIENCY 04/11/2007  . HYPOXEMIA 04/11/2007   Past Medical History  Diagnosis Date  . A-fib     chronic  /  holter 06/2010, no brady, mild tachy  . Tachycardia     holter, 06/2010, no brady, mild  tachy  . CAD (coronary artery disease)     2 DES, [facility], 2004  . Pleural effusion associated with pulmonary infection 03-2007  . Hypothyroid   . Hyperlipidemia   . GERD (gastroesophageal reflux disease)   . Diverticulosis of colon   . Allergy   . Anemia   . Arthritis     osteo  . Hypertension   . COPD (chronic obstructive pulmonary disease)   . Mitral regurgitation   . Vitamin B deficiency   . Compression fracture of lumbar vertebra 05-2008  . Osteoporosis   . Thrombocytopenia   . SOB (shortness of breath)   . OA (osteoarthritis)   . LBBB (left bundle branch  block)   . Hx of colonoscopy   . Warfarin anticoagulation   . Ejection fraction   . TR (tricuspid regurgitation)     moderate, echo 2008  . Dyslipidemia   . Lung abnormality     Dr Shelle Iron 122/2011 no further w/u needed  . Prolonged QT interval     when on sotolol in past  . Volume overload     intermittant  . Hypertension   . Weight loss     August, 2012  . Nausea     Some nausea with medications, May, 2013,  . Orthostatic hypotension     June, 2014  . Shingles    Past Surgical History  Procedure Laterality Date  . Thyroidectomy  1962  . Tonsillectomy     History  Substance Use Topics  . Smoking status: Former Smoker    Quit date: 09/14/1947  . Smokeless tobacco: Not on file   . Alcohol Use: No   Family History  Problem Relation Age of Onset  . Heart disease Mother 67  . Stroke Father 28  . Heart disease Sister   . Cancer Brother     colon  . Diabetes Brother   . Leukemia Sister    Allergies  Allergen Reactions  . Alendronate Sodium     REACTION: GI  . Sulfonamide Derivatives     REACTION: sick   Current Outpatient Prescriptions on File Prior to Visit  Medication Sig Dispense Refill  . acetaminophen (TYLENOL ARTHRITIS PAIN) 650 MG CR tablet Take 650 mg by mouth every 8 (eight) hours as needed for pain.       . cyanocobalamin (,VITAMIN B-12,) 1000 MCG/ML injection Inject 1,000 mcg into the muscle every 30 (thirty) days.       . feeding supplement (ENSURE) PUDG Take 1 Container by mouth 3 (three) times daily between meals.  150 g  10  . ferrous sulfate 325 (65 FE) MG tablet TAKE 1 TABLET EVERY DAY  90 tablet  0  . furosemide (LASIX) 20 MG tablet Take 1 tablet (20 mg total) by mouth as needed (For weight of 121 lbs. or greater).  90 tablet  1  . gabapentin (NEURONTIN) 300 MG capsule Take 1 capsule (300 mg total) by mouth 3 (three) times daily.  90 capsule  5  . glucosamine-chondroitin 500-400 MG tablet Take 1 tablet by mouth daily.        [name] levothyroxine (SYNTHROID, LEVOTHROID) 88 MCG tablet TAKE 1 TABLET (88 MCG TOTAL) BY MOUTH DAILY. **NEEDS OFFICE VISIT**  90 tablet  0  . Lutein 6 MG CAPS Take 1 capsule by mouth daily.       . metoprolol (LOPRESSOR) 50 MG tablet Take 0.5 tablets (25 mg total) by mouth 2 (two) times daily.  60 tablet  6  . midodrine (PROAMATINE) 5 MG tablet TAKE 1 TABLET BY MOUTH 2 TIMES A DAY WITH A MEAL  90 tablet  6  . Multiple Vitamin (MULTIVITAMIN) tablet Take 1 tablet by mouth daily.       . nitroGLYCERIN (NITROSTAT) 0.4 MG SL tablet Place 0.4 mg under the tongue every 5 (five) minutes as needed for chest pain.       [name] omeprazole (PRILOSEC) 20 MG capsule TAKE 1 CAPSULE TWICE A DAY  180 capsule  0  . potassium chloride (K-DUR) 10  MEQ tablet Take 1 tablet (10 mEq total) by mouth daily.  90 tablet  0  . simvastatin (ZOCOR) 20 MG tablet Take 5 mg  by mouth every evening.      . vitamin B-12 (CYANOCOBALAMIN) 1000 MCG tablet Take 1,000 mcg by mouth daily.      [name] warfarin (COUMADIN) 5 MG tablet Take 1 tablet (5 mg total) by mouth daily.  90 tablet  0   No current facility-administered medications on file prior to visit.     Review of Systems Review of Systems  Constitutional: Negative for  appetite change,  and unexpected weight change. pos for fatigue ENT pos for cong and rhinorrhea without facial pain  Eyes: Negative for pain and visual disturbance.  Respiratory: Negative for  shortness of breath.  pos for cough and some wheeze at night  Cardiovascular: Negative for cp or palpitations    Gastrointestinal: Negative for nausea, diarrhea and constipation.  Genitourinary: Negative for urgency and frequency.  Skin: Negative for pallor or rash   Neurological: Negative for weakness, light-headedness, numbness and headaches.  Hematological: Negative for adenopathy. Does not bruise/bleed easily.  Psychiatric/Behavioral: Negative for dysphoric mood. The patient is anxious        Objective:   Physical Exam  Constitutional: She appears well-developed and well-nourished. No distress.  Frail appearing - somewhat fatigued and not sob  HENT:  Head: Normocephalic and atraumatic.  Right Ear: External ear normal.  Left Ear: External ear normal.  Mouth/Throat: Oropharynx is clear and moist.  Nares are injected and congested  No sinus tenderness   Eyes: Conjunctivae and EOM are normal. Pupils are equal, round, and reactive to light. No scleral icterus.  Neck: Normal range of motion. Neck supple. No JVD present. Carotid bruit is not present. No thyromegaly present.  Cardiovascular: Normal rate, regular rhythm, normal heart sounds and intact distal pulses.  Exam reveals no gallop.   Pulmonary/Chest: Effort normal and breath sounds  normal. No respiratory distress. She has no wheezes. She exhibits no tenderness.  Diffuse rhonchi-esp at bases  Scant dry crackles -no rales Nl exp time  Nl air exch  Abdominal: Soft. Bowel sounds are normal. She exhibits no distension, no abdominal bruit and no mass. There is no tenderness.  Genitourinary: No breast swelling, tenderness, discharge or bleeding.  Musculoskeletal: Normal range of motion. She exhibits no edema and no tenderness.  Kyphosis   Lymphadenopathy:    She has no cervical adenopathy.  Neurological: She is alert. She has normal reflexes. No cranial nerve deficit. She exhibits normal muscle tone. Coordination normal.  Not confused   Skin: Skin is warm and dry. No rash noted. No erythema. No pallor.  Psychiatric:  milldy anxious  Mentally sharp today          Assessment & Plan:

## 2013-07-03 NOTE — Patient Instructions (Signed)
Start ceftin  mucinex (plain) is ok for congestion and cough (not DM)  Drink fluids  Schedule a protime appt early next week since the antibiotic may affect your INR  If worse or short of breath let me know We will call with a radiologist repor t

## 2013-07-04 NOTE — Assessment & Plan Note (Signed)
bp is low today- but has also been low in cardiology office No dizziness or orthostatic symptoms Cardiology suggested no change in med unless symptomatic Will continue to monitor

## 2013-07-04 NOTE — Assessment & Plan Note (Addendum)
Poss small infiltrates vs atelectasis / edema on cxr - pend rad comment Will cover for pneumonia as well  On coumadin - will have INR checked more frequently on abx  ceftin 500 mg bid for 7 d  mucinex for cough/ cong- NO DM due to hallucinations on that  Update if not starting to improve in a week or if worsening  - enc daughter to watch her closely for worsening or MS change

## 2013-07-06 ENCOUNTER — Telehealth: Payer: Self-pay | Admitting: Family Medicine

## 2013-07-06 NOTE — Telephone Encounter (Signed)
Daughter notified 

## 2013-07-06 NOTE — Telephone Encounter (Signed)
I'm glad she is doing better-thanks for the update - let me know if any changes

## 2013-07-06 NOTE — Telephone Encounter (Signed)
Pt's daughter, Jamesetta So, left vm giving update on pt.  States she is doing better, still coughing at night but otherwise symptoms improving.

## 2013-07-09 ENCOUNTER — Ambulatory Visit (INDEPENDENT_AMBULATORY_CARE_PROVIDER_SITE_OTHER): Payer: Medicare Other | Admitting: Family Medicine

## 2013-07-09 DIAGNOSIS — Z5181 Encounter for therapeutic drug level monitoring: Secondary | ICD-10-CM

## 2013-07-09 DIAGNOSIS — Z7901 Long term (current) use of anticoagulants: Secondary | ICD-10-CM

## 2013-07-09 DIAGNOSIS — I2510 Atherosclerotic heart disease of native coronary artery without angina pectoris: Secondary | ICD-10-CM

## 2013-07-16 ENCOUNTER — Ambulatory Visit: Payer: Medicare Other

## 2013-07-20 ENCOUNTER — Other Ambulatory Visit: Payer: Self-pay | Admitting: Family Medicine

## 2013-07-20 MED ORDER — OMEPRAZOLE 20 MG PO CPDR: DELAYED_RELEASE_CAPSULE | ORAL | Status: DC

## 2013-07-31 ENCOUNTER — Other Ambulatory Visit: Payer: Self-pay | Admitting: *Deleted

## 2013-07-31 MED ORDER — WARFARIN SODIUM 5 MG PO TABS: 5.0000 mg | ORAL_TABLET | Freq: Every day | ORAL | Status: DC

## 2013-07-31 MED ORDER — FERROUS SULFATE 325 (65 FE) MG PO TABS: ORAL_TABLET | ORAL | Status: DC

## 2013-07-31 MED ORDER — POTASSIUM CHLORIDE ER 10 MEQ PO TBCR: 10.0000 meq | EXTENDED_RELEASE_TABLET | Freq: Every day | ORAL | Status: DC

## 2013-08-06 ENCOUNTER — Ambulatory Visit (INDEPENDENT_AMBULATORY_CARE_PROVIDER_SITE_OTHER): Payer: Medicare Other | Admitting: Family Medicine

## 2013-08-06 DIAGNOSIS — I2510 Atherosclerotic heart disease of native coronary artery without angina pectoris: Secondary | ICD-10-CM

## 2013-08-06 DIAGNOSIS — Z7901 Long term (current) use of anticoagulants: Secondary | ICD-10-CM

## 2013-08-06 DIAGNOSIS — Z5181 Encounter for therapeutic drug level monitoring: Secondary | ICD-10-CM

## 2013-08-08 ENCOUNTER — Other Ambulatory Visit: Payer: Self-pay | Admitting: *Deleted

## 2013-08-08 NOTE — Telephone Encounter (Signed)
Pharmacy sent request for simvastatin refill. Pt hasn't had lipids done since 2012. Is it ok to refill?

## 2013-08-08 NOTE — Telephone Encounter (Signed)
Please schedule fasting lab for lipid prof and ast, alt for hyperlipidemia and refill med for 3 mo Thanks She has cardiology f/u in dec

## 2013-08-13 NOTE — Telephone Encounter (Signed)
Left voicemail requesting pt's daughter Chipper Oman) to call office

## 2013-08-14 MED ORDER — SIMVASTATIN 20 MG PO TABS: 10.0000 mg | ORAL_TABLET | Freq: Every evening | ORAL | Status: DC

## 2013-08-14 NOTE — Telephone Encounter (Signed)
appt scheduled and med refill 

## 2013-08-23 ENCOUNTER — Other Ambulatory Visit: Payer: Self-pay | Admitting: Family Medicine

## 2013-08-23 MED ORDER — GABAPENTIN 300 MG PO CAPS: 300.0000 mg | ORAL_CAPSULE | Freq: Three times a day (TID) | ORAL | Status: DC

## 2013-08-23 NOTE — Telephone Encounter (Signed)
Received fax from pharmacy requesting #90 day supply of medication, done

## 2013-08-27 ENCOUNTER — Ambulatory Visit (INDEPENDENT_AMBULATORY_CARE_PROVIDER_SITE_OTHER): Payer: Medicare Other | Admitting: Cardiology

## 2013-08-27 ENCOUNTER — Encounter: Payer: Self-pay | Admitting: Cardiology

## 2013-08-27 VITALS — BP 120/70 | HR 87 | Ht 67.0 in | Wt 122.0 lb

## 2013-08-27 DIAGNOSIS — I4891 Unspecified atrial fibrillation: Secondary | ICD-10-CM

## 2013-08-27 DIAGNOSIS — M25569 Pain in unspecified knee: Secondary | ICD-10-CM

## 2013-08-27 DIAGNOSIS — E8770 Fluid overload, unspecified: Secondary | ICD-10-CM

## 2013-08-27 DIAGNOSIS — R0602 Shortness of breath: Secondary | ICD-10-CM

## 2013-08-27 DIAGNOSIS — I951 Orthostatic hypotension: Secondary | ICD-10-CM

## 2013-08-27 DIAGNOSIS — E8779 Other fluid overload: Secondary | ICD-10-CM

## 2013-08-27 DIAGNOSIS — M25562 Pain in left knee: Secondary | ICD-10-CM

## 2013-08-27 LAB — HEPATIC FUNCTION PANEL
ALT: 14 U/L (ref 0–35)
AST: 23 U/L (ref 0–37)
Albumin: 4 g/dL (ref 3.5–5.2)
Total Protein: 6.3 g/dL (ref 6.0–8.3)

## 2013-08-27 LAB — BASIC METABOLIC PANEL
BUN: 17 mg/dL (ref 6–23)
GFR: 49.09 mL/min — ABNORMAL LOW (ref >60.00)
Glucose, Bld: 72 mg/dL (ref 70–99)
Potassium: 4.6 mEq/L (ref 3.5–5.1)

## 2013-08-27 NOTE — Assessment & Plan Note (Signed)
Her volume status is stable. She takes a small dose of Lasix if her weight reaches 121 pounds. This plan is working. No change in therapy.

## 2013-08-27 NOTE — Progress Notes (Signed)
HPI  Patient is seen today to followup atrial fibrillation and some mild volume overload. When I saw her last we make sure she was no longer drinking Gatorade on a regular basis. We made a plan to use Lasix only if her weight gets up to 121. She has done this on a rare occasion and actually is quite stable. She's not having any chest pain. She has some exertional shortness of breath but no PND or orthopnea.  Allergies  Allergen Reactions  . Alendronate Sodium     REACTION: GI  . Sulfonamide Derivatives     REACTION: sick    Current Outpatient Prescriptions  Medication Sig Dispense Refill  . acetaminophen (TYLENOL ARTHRITIS PAIN) 650 MG CR tablet Take 650 mg by mouth every 8 (eight) hours as needed for pain.       . cyanocobalamin (,VITAMIN B-12,) 1000 MCG/ML injection Inject 1,000 mcg into the muscle every 30 (thirty) days.       . feeding supplement (ENSURE) PUDG Take 1 Container by mouth 3 (three) times daily between meals.  150 g  10  . ferrous sulfate 325 (65 FE) MG tablet TAKE 1 TABLET EVERY DAY  90 tablet  1  . furosemide (LASIX) 20 MG tablet Take 1 tablet (20 mg total) by mouth as needed (For weight of 121 lbs. or greater).  90 tablet  1  . gabapentin (NEURONTIN) 300 MG capsule Take 1 capsule (300 mg total) by mouth 3 (three) times daily.  270 capsule  1  . glucosamine-chondroitin 500-400 MG tablet Take 1 tablet by mouth daily.        [name] levothyroxine (SYNTHROID, LEVOTHROID) 88 MCG tablet TAKE 1 TABLET (88 MCG TOTAL) BY MOUTH DAILY. **NEEDS OFFICE VISIT**  90 tablet  0  . Lutein 6 MG CAPS Take 1 capsule by mouth daily.       . Menthol (COUGH DROPS) 10 MG LOZG Use as directed in the mouth or throat as needed.      . metoprolol (LOPRESSOR) 50 MG tablet Take 0.5 tablets (25 mg total) by mouth 2 (two) times daily.  60 tablet  6  . midodrine (PROAMATINE) 5 MG tablet TAKE 1 TABLET BY MOUTH 2 TIMES A DAY WITH A MEAL  90 tablet  6  . Multiple Vitamin (MULTIVITAMIN) tablet Take 1 tablet  by mouth daily.       . nitroGLYCERIN (NITROSTAT) 0.4 MG SL tablet Place 0.4 mg under the tongue every 5 (five) minutes as needed for chest pain.       [name] omeprazole (PRILOSEC) 20 MG capsule TAKE 1 CAPSULE TWICE A DAY  180 capsule  1  . potassium chloride (K-DUR) 10 MEQ tablet Take 1 tablet (10 mEq total) by mouth daily.  90 tablet  1  . simvastatin (ZOCOR) 20 MG tablet Take 0.5 tablets (10 mg total) by mouth every evening.  30 tablet  2  . vitamin B-12 (CYANOCOBALAMIN) 1000 MCG tablet Take 1,000 mcg by mouth daily.      [name] warfarin (COUMADIN) 5 MG tablet Take 5 mg by mouth daily. 1/2 tab on Wednesday       No current facility-administered medications for this visit.    History   Social History  . Marital Status: Widowed    Spouse Name: N/A    Number of Children: N/A  . Years of Education: N/A   Occupational History  . Not on file.   Social History Main Topics  . Smoking status: Former  Smoker    Quit date: 09/14/1947  . Smokeless tobacco: Not on file  . Alcohol Use: No  . Drug Use: No  . Sexual Activity: Not Currently   Other Topics Concern  . Not on file   Social History Narrative   Very independent    Lives in [place] by herself           Family History  Problem Relation Age of Onset  . Heart disease Mother 80  . Stroke Father 55  . Heart disease Sister   . Cancer Brother     colon  . Diabetes Brother   . Leukemia Sister     Past Medical History  Diagnosis Date  . A-fib     chronic  /  holter 06/2010, no brady, mild tachy  . Tachycardia     holter, 06/2010, no brady, mild  tachy  . CAD (coronary artery disease)     2 DES, [facility], 2004  . Pleural effusion associated with pulmonary infection 03-2007  . Hypothyroid   . Hyperlipidemia   . GERD (gastroesophageal reflux disease)   . Diverticulosis of colon   . Allergy   . Anemia   . Arthritis     osteo  . Hypertension   . COPD (chronic obstructive pulmonary disease)   . Mitral regurgitation   . Vitamin B  deficiency   . Compression fracture of lumbar vertebra 05-2008  . Osteoporosis   . Thrombocytopenia   . SOB (shortness of breath)   . OA (osteoarthritis)   . LBBB (left bundle branch block)   . Hx of colonoscopy   . Warfarin anticoagulation   . Ejection fraction   . TR (tricuspid regurgitation)     moderate, echo 2008  . Dyslipidemia   . Lung abnormality     Dr Shelle Iron 122/2011 no further w/u needed  . Prolonged QT interval     when on sotolol in past  . Volume overload     intermittant  . Hypertension   . Weight loss     August, 2012  . Nausea     Some nausea with medications, May, 2013,  . Orthostatic hypotension     June, 2014  . Shingles     Past Surgical History  Procedure Laterality Date  . Thyroidectomy  1962  . Tonsillectomy      Patient Active Problem List   Diagnosis Date Noted  . Weight loss     Priority: High  . A-fib     Priority: High  . Tachycardia     Priority: High  . CAD (coronary artery disease)     Priority: High  . Mitral regurgitation     Priority: High  . SOB (shortness of breath)     Priority: High  . LBBB (left bundle branch block)     Priority: High  . Warfarin anticoagulation     Priority: High  . Ejection fraction     Priority: High  . TR (tricuspid regurgitation)     Priority: High  . Dyslipidemia     Priority: High  . Prolonged QT interval     Priority: High  . Volume overload     Priority: High  . Cough 07/03/2013  . Acute bronchitis 07/03/2013  . Orthostatic hypotension   . Acute on chronic combined systolic and diastolic heart failure 03/02/2013  . Acute renal failure 02/28/2013  . Dehydration 02/28/2013  . Shingles 02/28/2013  . Protein-calorie malnutrition, severe 02/28/2013  . Nail fungus  01/10/2013  . Productive cough 02/29/2012  . Nausea   . Left shoulder pain 10/06/2011  . Lumbar pain 10/06/2011  . Hyperlipidemia 07/09/2011  . Vision blurred 07/09/2011  . Hearing loss 07/09/2011  . Lung abnormality     . Coccydynia 12/03/2010  . Nonspecific (abnormal) findings on radiological and other examination of body structure 07/13/2010  . ABNORMAL CHEST XRAY 07/13/2010  . Shortness of breath 06/18/2010  . HYPERTENSION 06/24/2009  . GOUT, UNSPECIFIED 03/20/2009  . GERD 03/18/2009  . THROMBOCYTOPENIA 01/03/2009  . ALLERGIC RHINITIS 12/05/2008  . HOARSENESS 06/07/2008  . MEDIASTINAL LYMPHADENOPATHY 05/13/2008  . Other Diseases of Lung, not Elsewhere Classified 04/18/2008  . VENOUS INSUFFICIENCY 09/13/2007  . FREQUENCY, URINARY 07/19/2007  . VERTIGO, BENIGN PAROXYSMAL POSITION 05/11/2007  . TINNITUS NOS 05/11/2007  . OSTEOPOROSIS NOS 05/11/2007  . HYPOTHYROIDISM 04/11/2007  . ANEMIA, B12 DEFICIENCY 04/11/2007  . HYPOXEMIA 04/11/2007    ROS   Patient denies fever, chills, headache, sweats, rash, change in vision, change in hearing, chest pain, cough, nausea vomiting, urinary symptoms. All other systems are reviewed and are negative. She does have some arthritic pain in her left knee for which she uses Tylenol.  PHYSICAL EXAM  Patient is stable. She's here with her daughter as always. There is no jugulovenous distention. Lungs reveal scattered rhonchi. Cardiac exam reveals S1 and S2. There no clicks or significant murmurs. There is no respiratory distress. The abdomen is soft. There is no significant peripheral edema.  Filed Vitals:   08/27/13 1011  BP: 120/70  Pulse: 87  Height: 5\' 7"  (1.702 m)  Weight: 122 lb (55.339 kg)     ASSESSMENT & PLAN

## 2013-08-27 NOTE — Assessment & Plan Note (Signed)
Shortness of breath is stable for her. No change in therapy.

## 2013-08-27 NOTE — Assessment & Plan Note (Signed)
Her rate is controlled and she is on Coumadin. No change in therapy.

## 2013-08-27 NOTE — Patient Instructions (Signed)
Your physician wants you to follow-up in: 4 months. You will receive a reminder letter in the mail two months in advance. If you don't receive a letter, please call our office to schedule the follow-up appointment.  We will call you with the results of lab work done today

## 2013-08-27 NOTE — Assessment & Plan Note (Signed)
Patient takes a small dose of midodrine. This works quite well for her. No change in therapy.

## 2013-08-27 NOTE — Assessment & Plan Note (Signed)
The patient is taking Tylenol for this because she is on Coumadin. I've encouraged her to use a small amount possible. She may take as many as 6 per day. We will check liver functions.

## 2013-09-07 ENCOUNTER — Other Ambulatory Visit: Payer: Self-pay | Admitting: Family Medicine

## 2013-09-07 DIAGNOSIS — E785 Hyperlipidemia, unspecified: Secondary | ICD-10-CM

## 2013-09-07 MED ORDER — LEVOTHYROXINE SODIUM 88 MCG PO TABS: ORAL_TABLET | ORAL | Status: DC

## 2013-09-14 ENCOUNTER — Other Ambulatory Visit: Payer: Self-pay | Admitting: Family Medicine

## 2013-09-14 MED ORDER — NITROGLYCERIN 0.4 MG SL SUBL: 0.4000 mg | SUBLINGUAL_TABLET | SUBLINGUAL | Status: DC | PRN

## 2013-09-17 ENCOUNTER — Other Ambulatory Visit (INDEPENDENT_AMBULATORY_CARE_PROVIDER_SITE_OTHER): Payer: Medicare HMO

## 2013-09-17 ENCOUNTER — Other Ambulatory Visit: Payer: Self-pay | Admitting: Family Medicine

## 2013-09-17 ENCOUNTER — Ambulatory Visit (INDEPENDENT_AMBULATORY_CARE_PROVIDER_SITE_OTHER): Payer: Medicare HMO | Admitting: Family Medicine

## 2013-09-17 DIAGNOSIS — Z7901 Long term (current) use of anticoagulants: Secondary | ICD-10-CM

## 2013-09-17 DIAGNOSIS — I2510 Atherosclerotic heart disease of native coronary artery without angina pectoris: Secondary | ICD-10-CM

## 2013-09-17 DIAGNOSIS — E785 Hyperlipidemia, unspecified: Secondary | ICD-10-CM

## 2013-09-17 DIAGNOSIS — D518 Other vitamin B12 deficiency anemias: Secondary | ICD-10-CM

## 2013-09-17 DIAGNOSIS — Z5181 Encounter for therapeutic drug level monitoring: Secondary | ICD-10-CM

## 2013-09-17 LAB — LIPID PANEL
CHOL/HDL RATIO: 3
CHOLESTEROL: 133 mg/dL (ref 0–200)
HDL: 51.5 mg/dL (ref >39.00)
LDL CALC: 67 mg/dL (ref 0–99)
TRIGLYCERIDES: 71 mg/dL (ref 0.0–149.0)
VLDL: 14.2 mg/dL (ref 0.0–40.0)

## 2013-09-17 LAB — AST: AST: 23 U/L (ref 0–37)

## 2013-09-17 LAB — POCT INR: INR: 1.9

## 2013-09-17 LAB — ALT: ALT: 12 U/L (ref 0–35)

## 2013-09-17 MED ORDER — POTASSIUM CHLORIDE ER 10 MEQ PO TBCR: 10.0000 meq | EXTENDED_RELEASE_TABLET | Freq: Every day | ORAL | Status: DC

## 2013-09-17 MED ORDER — CYANOCOBALAMIN 1000 MCG/ML IJ SOLN
1000.0000 ug | Freq: Once | INTRAMUSCULAR | Status: AC
  Administered 2013-09-17: 1000 ug via INTRAMUSCULAR

## 2013-09-17 MED ORDER — GABAPENTIN 300 MG PO CAPS: 300.0000 mg | ORAL_CAPSULE | Freq: Three times a day (TID) | ORAL | Status: DC

## 2013-09-17 MED ORDER — METOPROLOL TARTRATE 50 MG PO TABS: 25.0000 mg | ORAL_TABLET | Freq: Two times a day (BID) | ORAL | Status: DC

## 2013-09-17 MED ORDER — FERROUS SULFATE 325 (65 FE) MG PO TABS: ORAL_TABLET | ORAL | Status: DC

## 2013-09-17 MED ORDER — OMEPRAZOLE 20 MG PO CPDR
DELAYED_RELEASE_CAPSULE | ORAL | Status: DC
Start: 2013-09-17 — End: 2014-08-12

## 2013-09-17 MED ORDER — WARFARIN SODIUM 5 MG PO TABS: 5.0000 mg | ORAL_TABLET | Freq: Every day | ORAL | Status: DC

## 2013-09-17 MED ORDER — LEVOTHYROXINE SODIUM 88 MCG PO TABS: ORAL_TABLET | ORAL | Status: DC

## 2013-09-17 MED ORDER — SIMVASTATIN 20 MG PO TABS: 10.0000 mg | ORAL_TABLET | Freq: Every evening | ORAL | Status: DC

## 2013-09-17 NOTE — Telephone Encounter (Signed)
Can refill all for a year - elect or if needs to be printed -please print and I will sign them

## 2013-09-17 NOTE — Telephone Encounter (Signed)
Received fax that pt needs Rxs sent through Mail order pharmacy, please advise

## 2013-09-17 NOTE — Telephone Encounter (Signed)
Rxs sent electronically.  

## 2013-09-18 ENCOUNTER — Encounter: Payer: Self-pay | Admitting: *Deleted

## 2013-09-24 ENCOUNTER — Other Ambulatory Visit: Payer: Self-pay | Admitting: *Deleted

## 2013-09-24 MED ORDER — FUROSEMIDE 20 MG PO TABS: 20.0000 mg | ORAL_TABLET | ORAL | Status: DC | PRN

## 2013-09-25 ENCOUNTER — Other Ambulatory Visit: Payer: Self-pay | Admitting: Family Medicine

## 2013-09-25 MED ORDER — WARFARIN SODIUM 5 MG PO TABS: 5.0000 mg | ORAL_TABLET | Freq: Every day | ORAL | Status: DC

## 2013-10-02 ENCOUNTER — Other Ambulatory Visit: Payer: Self-pay | Admitting: Family Medicine

## 2013-10-02 MED ORDER — WARFARIN SODIUM 5 MG PO TABS: 5.0000 mg | ORAL_TABLET | Freq: Every day | ORAL | Status: DC

## 2013-10-29 ENCOUNTER — Ambulatory Visit (INDEPENDENT_AMBULATORY_CARE_PROVIDER_SITE_OTHER): Payer: Medicare HMO | Admitting: Family Medicine

## 2013-10-29 DIAGNOSIS — I2510 Atherosclerotic heart disease of native coronary artery without angina pectoris: Secondary | ICD-10-CM

## 2013-10-29 DIAGNOSIS — Z7901 Long term (current) use of anticoagulants: Secondary | ICD-10-CM

## 2013-10-29 DIAGNOSIS — Z5181 Encounter for therapeutic drug level monitoring: Secondary | ICD-10-CM

## 2013-10-29 LAB — POCT INR: INR: 2

## 2013-11-23 ENCOUNTER — Telehealth: Payer: Self-pay | Admitting: Family Medicine

## 2013-11-23 NOTE — Telephone Encounter (Signed)
Pt's daughter is calling to see if pt could get referral to Dr. Claudia Pollock for Rheumatology and Osteoporosis. Pt's knee pain is getting very bad and she wanted to go and see someone. Please advise.

## 2013-11-23 NOTE — Telephone Encounter (Signed)
Daughter called in and lmom that she wants a referral to Dr Precious Reel b/c her mothers knee is hurting. She also has [employer] and needs an official referral.

## 2013-11-23 NOTE — Telephone Encounter (Signed)
See prev note

## 2013-11-23 NOTE — Telephone Encounter (Signed)
Dr Precious Reel is a rheumatologist - he sees autoimmune joint disease and I think [name] has osteoarthritis (wear and tear type -not auto immune)  In that case- we usually refer to orthopedics instead of rheumatology

## 2013-11-26 NOTE — Telephone Encounter (Signed)
Left voicemail on [name]) daughter's voicemail requesting pt to call office

## 2013-11-26 NOTE — Telephone Encounter (Signed)
Daughter Windy Kalata) said she is going to check with her mother and see if she wants a referral to Ortho. If not she may call back and schedule an appt with Dr. Lorelei Pont for eval of her worsening knee pain.

## 2013-11-26 NOTE — Telephone Encounter (Signed)
That is fine either way- just keep me posted

## 2013-12-10 ENCOUNTER — Ambulatory Visit (INDEPENDENT_AMBULATORY_CARE_PROVIDER_SITE_OTHER): Payer: Medicare HMO | Admitting: Family Medicine

## 2013-12-10 ENCOUNTER — Ambulatory Visit (INDEPENDENT_AMBULATORY_CARE_PROVIDER_SITE_OTHER)
Admission: RE | Admit: 2013-12-10 | Discharge: 2013-12-10 | Disposition: A | Discharge status: Home / Self Care | Payer: Medicare HMO | Source: Ambulatory Visit | Attending: Family Medicine | Admitting: Family Medicine

## 2013-12-10 ENCOUNTER — Encounter: Payer: Self-pay | Admitting: Family Medicine

## 2013-12-10 VITALS — BP 139/90 | HR 76 | Temp 97.8°F | Ht 67.0 in | Wt 128.5 lb

## 2013-12-10 DIAGNOSIS — M5136 Other intervertebral disc degeneration, lumbar region: Secondary | ICD-10-CM

## 2013-12-10 DIAGNOSIS — M25569 Pain in unspecified knee: Secondary | ICD-10-CM

## 2013-12-10 DIAGNOSIS — M1710 Unilateral primary osteoarthritis, unspecified knee: Secondary | ICD-10-CM

## 2013-12-10 DIAGNOSIS — M545 Low back pain, unspecified: Secondary | ICD-10-CM

## 2013-12-10 DIAGNOSIS — M25561 Pain in right knee: Secondary | ICD-10-CM

## 2013-12-10 DIAGNOSIS — M179 Osteoarthritis of knee, unspecified: Secondary | ICD-10-CM

## 2013-12-10 DIAGNOSIS — M51379 Other intervertebral disc degeneration, lumbosacral region without mention of lumbar back pain or lower extremity pain: Secondary | ICD-10-CM

## 2013-12-10 DIAGNOSIS — Z5181 Encounter for therapeutic drug level monitoring: Secondary | ICD-10-CM

## 2013-12-10 DIAGNOSIS — I2510 Atherosclerotic heart disease of native coronary artery without angina pectoris: Secondary | ICD-10-CM

## 2013-12-10 DIAGNOSIS — Z7901 Long term (current) use of anticoagulants: Secondary | ICD-10-CM

## 2013-12-10 DIAGNOSIS — M5137 Other intervertebral disc degeneration, lumbosacral region: Secondary | ICD-10-CM

## 2013-12-10 DIAGNOSIS — IMO0002 Reserved for concepts with insufficient information to code with codable children: Secondary | ICD-10-CM

## 2013-12-10 LAB — POCT INR: INR: 2

## 2013-12-10 MED ORDER — LIDOCAINE 5 % EX PTCH: 1.0000 | MEDICATED_PATCH | CUTANEOUS | Status: DC

## 2013-12-10 NOTE — Progress Notes (Signed)
Date:  12/10/2013   Name:  [name]   DOB:  1922/04/03   MRN:  [id number]  Primary Physician:  Loura Pardon, MD   Chief Complaint: Follow-up and Hip Pain   Subjective:   History of Present Illness:  [name] is a 80 y.o. pleasant patient who presents with the following:  Knees, hip, and posterior back.  ddd L > R knee will hurt.   89-year-old patient known well to me who presents with primarily back and posterior back and posterior hip pain, as well as bilateral knee pain.  I previously had seen her before, and at that time we did bilateral knee injections a little bit over 6 months ago. This did provide some relief for a month or 2.  Her knees primarily hurt when she is up and walking, rising from a seated position and walking around. At rest they're not particularly painful. She does have some significant posterior hip pain, without groin pain. She does have some pain that varies on the day in the posterior lower back and posterior pelvic region, but this can be variable based on that day.  Patient Active Problem List   Diagnosis Date Noted  . Knee pain 08/27/2013  . Cough 07/03/2013  . Acute bronchitis 07/03/2013  . Orthostatic hypotension   . Acute on chronic combined systolic and diastolic heart failure [id number]  . Acute renal failure 02/28/2013  . Dehydration 02/28/2013  . Shingles 02/28/2013  . Protein-calorie malnutrition, severe 02/28/2013  . Nail fungus 01/10/2013  . Productive cough 02/29/2012  . Nausea   . Left shoulder pain 10/06/2011  . Lumbar pain 10/06/2011  . Hyperlipidemia 07/09/2011  . Vision blurred 07/09/2011  . Hearing loss 07/09/2011  . Weight loss   . A-fib   . Tachycardia   . CAD (coronary artery disease)   . Mitral regurgitation   . SOB (shortness of breath)   . LBBB (left bundle branch block)   . Warfarin anticoagulation   . Ejection fraction   . TR (tricuspid regurgitation)   . Dyslipidemia   . Lung abnormality   .  Prolonged QT interval   . Volume overload   . Coccydynia 12/03/2010  . Nonspecific (abnormal) findings on radiological and other examination of body structure 07/13/2010  . ABNORMAL CHEST XRAY 07/13/2010  . Shortness of breath 06/18/2010  . HYPERTENSION 06/24/2009  . GOUT, UNSPECIFIED 03/20/2009  . GERD 03/18/2009  . THROMBOCYTOPENIA 01/03/2009  . ALLERGIC RHINITIS 12/05/2008  . HOARSENESS 06/07/2008  . MEDIASTINAL LYMPHADENOPATHY 05/13/2008  . Other Diseases of Lung, not Elsewhere Classified 04/18/2008  . VENOUS INSUFFICIENCY 09/13/2007  . FREQUENCY, URINARY 07/19/2007  . VERTIGO, BENIGN PAROXYSMAL POSITION 05/11/2007  . TINNITUS NOS 05/11/2007  . OSTEOPOROSIS NOS 05/11/2007  . HYPOTHYROIDISM 04/11/2007  . ANEMIA, B12 DEFICIENCY 04/11/2007  . HYPOXEMIA 04/11/2007    Past Medical History  Diagnosis Date  . A-fib     chronic  /  holter 06/2010, no brady, mild tachy  . Tachycardia     holter, 06/2010, no brady, mild  tachy  . CAD (coronary artery disease)     2 DES, [facility], 2004  . Pleural effusion associated with pulmonary infection 03-2007  . Hypothyroid   . Hyperlipidemia   . GERD (gastroesophageal reflux disease)   . Diverticulosis of colon   . Allergy   . Anemia   . Arthritis     osteo  . Hypertension   . COPD (chronic obstructive pulmonary disease)   .  Mitral regurgitation   . Vitamin B deficiency   . Compression fracture of lumbar vertebra 05-2008  . Osteoporosis   . Thrombocytopenia   . SOB (shortness of breath)   . OA (osteoarthritis)   . LBBB (left bundle branch block)   . Hx of colonoscopy   . Warfarin anticoagulation   . Ejection fraction   . TR (tricuspid regurgitation)     moderate, echo 2008  . Dyslipidemia   . Lung abnormality     Dr [name] 122/2011 no further w/u needed  . Prolonged QT interval     when on sotolol in past  . Volume overload     intermittant  . Hypertension   . Weight loss     August, 2012  . Nausea     Some nausea with  medications, May, 2013,  . Orthostatic hypotension     June, 2014  . Shingles     Past Surgical History  Procedure Laterality Date  . Thyroidectomy  1962  . Tonsillectomy      History   Social History  . Marital Status: Widowed    Spouse Name: N/A    Number of Children: N/A  . Years of Education: N/A   Occupational History  . Not on file.   Social History Main Topics  . Smoking status: Former Smoker    Quit date: 09/14/1947  . Smokeless tobacco: Never Used  . Alcohol Use: No  . Drug Use: No  . Sexual Activity: Not Currently   Other Topics Concern  . Not on file   Social History Narrative   Very independent    Lives in [place] by herself           Family History  Problem Relation Age of Onset  . Heart disease Mother 23  . Stroke Father 63  . Heart disease Sister   . Cancer Brother     colon  . Diabetes Brother   . Leukemia Sister     Allergies  Allergen Reactions  . Alendronate Sodium     REACTION: GI  . Sulfonamide Derivatives     REACTION: sick    Medication list has been reviewed and updated.  Review of Systems:  GEN: No fevers, chills. Nontoxic. Primarily MSK c/o today. MSK: Detailed in the HPI GI: tolerating PO intake without difficulty Neuro: No numbness, parasthesias, or tingling associated. Otherwise the pertinent positives of the ROS are noted above.   Objective:   Physical Examination: BP 139/90  Pulse 76  Temp(Src) 97.8 F (36.6 C) (Oral)  Ht 5\' 7"  (1.702 m)  Wt 128 lb 8 oz (58.287 kg)  BMI 20.12 kg/m2  Ideal Body Weight: Weight in (lb) to have BMI = 25: 159.3   GEN: WDWN, NAD, Non-toxic, Alert & Oriented x 3 HEENT: Atraumatic, Normocephalic.  Ears and Nose: No external deformity. EXTR: No clubbing/cyanosis/edema NEURO: Normal gait.  PSYCH: Normally interactive. Conversant. Not depressed or anxious appearing.  Calm demeanor.   Knee:  b Gait: Normal heel toe pattern ROM: lack of 3 deg ext to 115 Effusion:  neg Echymosis or edema: none Patellar tendon NT Painful PLICA: neg Patellar grind: negative Medial and lateral patellar facet loading: minimal motion medial and lateral joint lines: tender Mcmurray's neg Flexion-pinch pain Varus and valgus stress: stable Lachman: neg Ant and Post drawer: neg Hip abduction, IR, ER: WNL Hip flexion str: 5/5 Hip abd: 5/5 Quad: 5/5 VMO atrophy:No Hamstring concentric and eccentric: 5/5   She also has tenderness more  on the RIGHT today around L4-S1, nontender throughout the spinous processes. Excellent range of motion in bilateral hips. Neurovascularly intact in the lower extremity standpoint. Straight leg raise is negative. She does have some tenderness at the SI joints bilaterally. No tenderness at the trochanteric bursa.  Dg Lumbar Spine Complete  12/10/2013   CLINICAL DATA:  Low back pain.  EXAM: LUMBAR SPINE - COMPLETE 4+ VIEW  COMPARISON:  10/06/2011  FINDINGS: Mild curvature, convex to the left with the apex at L2, stable.  The compression fracture of T12 has been treated with vertebroplasty since the prior study.  There are mild depressions of the upper endplates of L3 and L4, stable.  No new fractures.  Bones are diffusely demineralized.  There is moderate to marked loss disc height at L5-S1. There is mild loss of disc height at L2-L3 through L3-L4. Facet joint narrowing is noted on the left at L3-L4 and L4-L5.  Bones are diffusely demineralized. There are dense calcifications noted along the abdominal aorta.  IMPRESSION: 1. No acute findings.  No new fractures. 2. Old fractures of T12, L3 and L4. T12 has been treated with vertebroplasty since the prior study. 3. Degenerative changes and diffuse bony demineralization, stable.   Electronically Signed   By: Lajean Manes M.D.   On: 12/10/2013 11:53   Dg Knee Ap/lat W/sunrise Right  12/10/2013   CLINICAL DATA:  Right knee pain.  EXAM: DG KNEE - 3 VIEWS  COMPARISON:  None.  FINDINGS: No fracture. No  dislocation. Joint spaces are relatively well maintained. There are small marginal osteophytes from all 3 compartments. Mild spurring is seen from the tibial spine. Bones are diffusely demineralized.  No joint effusion.  There dense vascular calcifications posteriorly.  IMPRESSION: 1. No fracture or acute finding.  No joint effusion. 2. Minor degenerative changes reflected by small marginal osteophytes.   Electronically Signed   By: Lajean Manes M.D.   On: 12/10/2013 11:48    Assessment & Plan:   DDD (degenerative disc disease), lumbar  Right knee pain - Plan: DG Knee AP/LAT W/Sunrise Right  Lumbago - Plan: DG Lumbar Spine Complete  Osteoarthritis, knee  Warfarin anticoagulation  Primary problem at this point appears to be secondary to severe degenerative disc disease as well as spondyloarthropathy. Management is challenging. Trial of lidocaine patch, use is reviewed. Chronically on Coumadin. In a 80 year old, I am reluctant to initiate narcotics. If she does have recalcitrant symptoms, I low-dose fentanyl patch, 12.5 might be reasonable to try.  She also has moderate tricompartmental arthritis bilaterally.  Knee Injection, LEFT Patient verbally consented to procedure. Risks (including potential rare risk of infection), benefits, and alternatives explained. Sterilely prepped with Chloraprep. Ethyl cholride used for anesthesia. 8 cc Lidocaine 1% mixed with 2 cc of Depo-Medrol 40 mg injected using the anteromedial approach without difficulty. No complications with procedure and tolerated well. Patient had decreased pain post-injection.   No Follow-up on file.  Orders Placed This Encounter  Procedures  . DG Knee AP/LAT W/Sunrise Right  . DG Lumbar Spine Complete   Patient's Medications  New Prescriptions   LIDOCAINE (LIDODERM) 5 %    Place 1 patch onto the skin daily. Remove & Discard patch within 12 hours or as directed by MD  Previous Medications   ACETAMINOPHEN (TYLENOL ARTHRITIS  PAIN) 650 MG CR TABLET    Take 650 mg by mouth every 8 (eight) hours as needed for pain.    CYANOCOBALAMIN (,VITAMIN B-12,) 1000 MCG/ML INJECTION  Inject 1,000 mcg into the muscle every 30 (thirty) days.    FEEDING SUPPLEMENT (ENSURE) PUDG    Take 1 Container by mouth 3 (three) times daily between meals.   FERROUS SULFATE 325 (65 FE) MG TABLET    TAKE 1 TABLET EVERY DAY   FUROSEMIDE (LASIX) 20 MG TABLET    Take 1 tablet (20 mg total) by mouth as needed (For weight of 121 lbs. or greater).   GABAPENTIN (NEURONTIN) 300 MG CAPSULE    Take 1 capsule (300 mg total) by mouth 3 (three) times daily.   GLUCOSAMINE-CHONDROITIN 500-400 MG TABLET    Take 1 tablet by mouth daily.     LEVOTHYROXINE (SYNTHROID, LEVOTHROID) 88 MCG TABLET    TAKE 1 TABLET (88 MCG TOTAL) BY MOUTH DAILY.   LUTEIN 6 MG CAPS    Take 1 capsule by mouth daily.    MENTHOL (COUGH DROPS) 10 MG LOZG    Use as directed in the mouth or throat as needed.   METOPROLOL (LOPRESSOR) 50 MG TABLET    Take 0.5 tablets (25 mg total) by mouth 2 (two) times daily.   MIDODRINE (PROAMATINE) 5 MG TABLET    TAKE 1 TABLET BY MOUTH 2 TIMES A DAY WITH A MEAL   MULTIPLE VITAMIN (MULTIVITAMIN) TABLET    Take 1 tablet by mouth daily.    NITROGLYCERIN (NITROSTAT) 0.4 MG SL TABLET    Place 1 tablet (0.4 mg total) under the tongue every 5 (five) minutes as needed for chest pain.   OMEPRAZOLE (PRILOSEC) 20 MG CAPSULE    TAKE 1 CAPSULE TWICE A DAY   POTASSIUM CHLORIDE (K-DUR) 10 MEQ TABLET    Take 1 tablet (10 mEq total) by mouth daily.   SIMVASTATIN (ZOCOR) 20 MG TABLET    Take 0.5 tablets (10 mg total) by mouth every evening.   VITAMIN B-12 (CYANOCOBALAMIN) 1000 MCG TABLET    Take 1,000 mcg by mouth daily.   WARFARIN (COUMADIN) 5 MG TABLET    Take 1 tablet (5 mg total) by mouth daily. 1 and 1/2 tablets on Wednesday  Modified Medications   No medications on file  Discontinued Medications   No medications on file   There are no Patient Instructions on file for  this visit.  Signed,  Maud Deed. Kasper Mudrick, MD, [place] [facility] [place] [address] Phone: [phone] Fax: [phone]

## 2013-12-10 NOTE — Progress Notes (Signed)
Pre visit review using our clinic review tool, if applicable. No additional management support is needed unless otherwise documented below in the visit note. 

## 2013-12-17 ENCOUNTER — Other Ambulatory Visit: Payer: Self-pay | Admitting: Family Medicine

## 2013-12-17 MED ORDER — WARFARIN SODIUM 5 MG PO TABS: 5.0000 mg | ORAL_TABLET | Freq: Every day | ORAL | Status: DC

## 2014-01-02 ENCOUNTER — Encounter: Payer: Self-pay | Admitting: Family Medicine

## 2014-01-02 ENCOUNTER — Telehealth: Payer: Self-pay | Admitting: *Deleted

## 2014-01-02 NOTE — Telephone Encounter (Signed)
Received prior authorization request from [place] for Lidocaine Patches.  Prior Auth completed on CoverMyMeds.

## 2014-01-04 ENCOUNTER — Encounter: Payer: Self-pay | Admitting: Cardiology

## 2014-01-04 ENCOUNTER — Ambulatory Visit (INDEPENDENT_AMBULATORY_CARE_PROVIDER_SITE_OTHER): Payer: Medicare HMO | Admitting: Cardiology

## 2014-01-04 VITALS — BP 118/66 | HR 83 | Ht 67.0 in | Wt 126.8 lb

## 2014-01-04 DIAGNOSIS — I951 Orthostatic hypotension: Secondary | ICD-10-CM

## 2014-01-04 DIAGNOSIS — I5032 Chronic diastolic (congestive) heart failure: Secondary | ICD-10-CM | POA: Insufficient documentation

## 2014-01-04 DIAGNOSIS — I509 Heart failure, unspecified: Secondary | ICD-10-CM

## 2014-01-04 DIAGNOSIS — Z7901 Long term (current) use of anticoagulants: Secondary | ICD-10-CM

## 2014-01-04 DIAGNOSIS — I4891 Unspecified atrial fibrillation: Secondary | ICD-10-CM

## 2014-01-04 NOTE — Assessment & Plan Note (Signed)
She does very well with a small dose of midodrine. No change in therapy.

## 2014-01-04 NOTE — Assessment & Plan Note (Signed)
She has gained a few pounds of real body weight. We will adjust her dry weight up to 123 pounds. Above this she will use her diuretic.

## 2014-01-04 NOTE — Progress Notes (Signed)
Patient ID: [name], female   DOB: [birth year], 80 y.o.   MRN: [id number]    HPI  Patient is seen today for followup atrial fibrillation and chronic diastolic CHF. She watches her weight very carefully. She has gained a few pounds of true body weight. We will adjust her dry weight up to 123 pounds. She actually looks the best that I've seen her in several years. She is not having any significant shortness of breath.  Allergies  Allergen Reactions  . Alendronate Sodium     REACTION: GI  . Sulfonamide Derivatives     REACTION: sick    Current Outpatient Prescriptions  Medication Sig Dispense Refill  . acetaminophen (TYLENOL ARTHRITIS PAIN) 650 MG CR tablet Take 650 mg by mouth every 8 (eight) hours as needed for pain.       . cyanocobalamin (,VITAMIN B-12,) 1000 MCG/ML injection Inject 1,000 mcg into the muscle every 30 (thirty) days.       . feeding supplement (ENSURE) PUDG Take 1 Container by mouth 3 (three) times daily between meals.  150 g  10  . ferrous sulfate 325 (65 FE) MG tablet TAKE 1 TABLET EVERY DAY  90 tablet  3  . furosemide (LASIX) 20 MG tablet Take 1 tablet (20 mg total) by mouth as needed (For weight of 121 lbs. or greater).  90 tablet  0  . gabapentin (NEURONTIN) 300 MG capsule Take 1 capsule (300 mg total) by mouth 3 (three) times daily.  270 capsule  3  . glucosamine-chondroitin 500-400 MG tablet Take 1 tablet by mouth daily.        [name] levothyroxine (SYNTHROID, LEVOTHROID) 88 MCG tablet TAKE 1 TABLET (88 MCG TOTAL) BY MOUTH DAILY.  90 tablet  3  . lidocaine (LIDODERM) 5 % Place 1 patch onto the skin daily. Remove & Discard patch within 12 hours or as directed by MD  30 patch  3  . Lutein 6 MG CAPS Take 1 capsule by mouth daily.       . Menthol (COUGH DROPS) 10 MG LOZG Use as directed in the mouth or throat as needed.      . metoprolol (LOPRESSOR) 50 MG tablet Take 0.5 tablets (25 mg total) by mouth 2 (two) times daily.  90 tablet  3  . midodrine (PROAMATINE) 5 MG  tablet TAKE 1 TABLET BY MOUTH 2 TIMES A DAY WITH A MEAL  90 tablet  6  . Multiple Vitamin (MULTIVITAMIN) tablet Take 1 tablet by mouth daily.       . nitroGLYCERIN (NITROSTAT) 0.4 MG SL tablet Place 1 tablet (0.4 mg total) under the tongue every 5 (five) minutes as needed for chest pain.  25 tablet  0  . omeprazole (PRILOSEC) 20 MG capsule TAKE 1 CAPSULE TWICE A DAY  180 capsule  3  . potassium chloride (K-DUR) 10 MEQ tablet Take 1 tablet (10 mEq total) by mouth daily.  90 tablet  3  . simvastatin (ZOCOR) 20 MG tablet Take 0.5 tablets (10 mg total) by mouth every evening.  90 tablet  3  . vitamin B-12 (CYANOCOBALAMIN) 1000 MCG tablet Take 1,000 mcg by mouth daily.      [name] warfarin (COUMADIN) 5 MG tablet Take 1 tablet (5 mg total) by mouth daily. 1 and 1/2 tablets on Wednesday  98 tablet  0   No current facility-administered medications for this visit.    History   Social History  . Marital Status: Widowed    Spouse  Name: N/A    Number of Children: N/A  . Years of Education: N/A   Occupational History  . Not on file.   Social History Main Topics  . Smoking status: Former Smoker    Quit date: 09/14/1947  . Smokeless tobacco: Never Used  . Alcohol Use: No  . Drug Use: No  . Sexual Activity: Not Currently   Other Topics Concern  . Not on file   Social History Narrative   Very independent    Lives in [place] by herself           Family History  Problem Relation Age of Onset  . Heart disease Mother 49  . Stroke Father 17  . Heart disease Sister   . Cancer Brother     colon  . Diabetes Brother   . Leukemia Sister     Past Medical History  Diagnosis Date  . A-fib     chronic  /  holter 06/2010, no brady, mild tachy  . Tachycardia     holter, 06/2010, no brady, mild  tachy  . CAD (coronary artery disease)     2 DES, [facility], 2004  . Pleural effusion associated with pulmonary infection 03-2007  . Hypothyroid   . Hyperlipidemia   . GERD (gastroesophageal reflux disease)    . Diverticulosis of colon   . Allergy   . Anemia   . Arthritis     osteo  . Hypertension   . COPD (chronic obstructive pulmonary disease)   . Mitral regurgitation   . Vitamin B deficiency   . Compression fracture of lumbar vertebra 05-2008  . Osteoporosis   . Thrombocytopenia   . SOB (shortness of breath)   . OA (osteoarthritis)   . LBBB (left bundle branch block)   . Hx of colonoscopy   . Warfarin anticoagulation   . Ejection fraction   . TR (tricuspid regurgitation)     moderate, echo 2008  . Dyslipidemia   . Lung abnormality     Dr [name] 122/2011 no further w/u needed  . Prolonged QT interval     when on sotolol in past  . Volume overload     intermittant  . Hypertension   . Weight loss     August, 2012  . Nausea     Some nausea with medications, May, 2013,  . Orthostatic hypotension     June, 2014  . Shingles     Past Surgical History  Procedure Laterality Date  . Thyroidectomy  1962  . Tonsillectomy      Patient Active Problem List   Diagnosis Date Noted  . Weight loss     Priority: High  . A-fib     Priority: High  . Tachycardia     Priority: High  . CAD (coronary artery disease)     Priority: High  . Mitral regurgitation     Priority: High  . SOB (shortness of breath)     Priority: High  . LBBB (left bundle branch block)     Priority: High  . Warfarin anticoagulation     Priority: High  . Ejection fraction     Priority: High  . TR (tricuspid regurgitation)     Priority: High  . Dyslipidemia     Priority: High  . Prolonged QT interval     Priority: High  . Volume overload     Priority: High  . Knee pain 08/27/2013  . Cough 07/03/2013  . Acute bronchitis 07/03/2013  .  Orthostatic hypotension   . Acute on chronic combined systolic and diastolic heart failure 56/25/6389  . Acute renal failure 02/28/2013  . Dehydration 02/28/2013  . Shingles 02/28/2013  . Protein-calorie malnutrition, severe 02/28/2013  . Nail fungus 01/10/2013  .  Productive cough 02/29/2012  . Nausea   . Left shoulder pain 10/06/2011  . Lumbar pain 10/06/2011  . Hyperlipidemia 07/09/2011  . Vision blurred 07/09/2011  . Hearing loss 07/09/2011  . Lung abnormality   . Coccydynia 12/03/2010  . Nonspecific (abnormal) findings on radiological and other examination of body structure 07/13/2010  . ABNORMAL CHEST XRAY 07/13/2010  . Shortness of breath 06/18/2010  . HYPERTENSION 06/24/2009  . GOUT, UNSPECIFIED 03/20/2009  . GERD 03/18/2009  . THROMBOCYTOPENIA 01/03/2009  . ALLERGIC RHINITIS 12/05/2008  . HOARSENESS 06/07/2008  . MEDIASTINAL LYMPHADENOPATHY 05/13/2008  . Other Diseases of Lung, not Elsewhere Classified 04/18/2008  . VENOUS INSUFFICIENCY 09/13/2007  . FREQUENCY, URINARY 07/19/2007  . VERTIGO, BENIGN PAROXYSMAL POSITION 05/11/2007  . TINNITUS NOS 05/11/2007  . OSTEOPOROSIS NOS 05/11/2007  . HYPOTHYROIDISM 04/11/2007  . ANEMIA, B12 DEFICIENCY 04/11/2007  . HYPOXEMIA 04/11/2007    ROS   Patient denies fever, chills, headache, sweats, rash, change in vision, change in hearing, chest pain, cough, nausea vomiting, urinary symptoms. She has a fungus on her finger nail. She is not allowed to use the oral medication for this because of Coumadin therapy. All other systems are reviewed and are negative.  PHYSICAL EXAM  Patient is oriented to person time and place. Affect is normal. She looks quite good. She is here with her daughter. There is no jugulovenous distention. Head is atraumatic. Conjunctiva are normal. Lungs reveal a few scattered rhonchi. There is no respiratory distress. Cardiac exam reveals S1 and S2. The rhythm is irregularly irregular. The rate is controlled. Abdomen is soft. There is no peripheral edema.  Filed Vitals:   01/04/14 1057  BP: 118/66  Pulse: 83  Height: 5\' 7"  (1.702 m)  Weight: 126 lb 12.8 oz (57.516 kg)     ASSESSMENT & PLAN

## 2014-01-04 NOTE — Assessment & Plan Note (Signed)
Coumadin is continued.

## 2014-01-04 NOTE — Assessment & Plan Note (Signed)
Atrial fibrillation is controlled. No change in therapy.

## 2014-01-04 NOTE — Patient Instructions (Addendum)
Your physician recommends that you continue on your current medications as directed. Please refer to the Current Medication list given to you today.  Your physician wants you to follow-up in: 6 months. You will receive a reminder letter in the mail two months in advance. If you don't receive a letter, please call our office to schedule the follow-up appointment.  Your new dry weight is 123 lbs. When you weight is is 123 lbs or greater take Lasix 20 mg

## 2014-01-09 NOTE — Telephone Encounter (Signed)
Lidocaine patches denied.  Faxed Letter of appeal.  Appeal also denied.

## 2014-01-21 ENCOUNTER — Ambulatory Visit (INDEPENDENT_AMBULATORY_CARE_PROVIDER_SITE_OTHER): Payer: Medicare HMO | Admitting: Family Medicine

## 2014-01-21 DIAGNOSIS — Z5181 Encounter for therapeutic drug level monitoring: Secondary | ICD-10-CM

## 2014-01-21 DIAGNOSIS — I2510 Atherosclerotic heart disease of native coronary artery without angina pectoris: Secondary | ICD-10-CM

## 2014-01-21 DIAGNOSIS — Z7901 Long term (current) use of anticoagulants: Secondary | ICD-10-CM

## 2014-01-21 LAB — POCT INR: INR: 2.1

## 2014-01-21 MED ORDER — CYANOCOBALAMIN 1000 MCG/ML IJ SOLN
1000.0000 ug | Freq: Once | INTRAMUSCULAR | Status: AC
  Administered 2014-01-21: 1000 ug via INTRAMUSCULAR

## 2014-02-20 ENCOUNTER — Other Ambulatory Visit: Payer: Self-pay | Admitting: Family Medicine

## 2014-02-20 MED ORDER — WARFARIN SODIUM 5 MG PO TABS: 5.0000 mg | ORAL_TABLET | Freq: Every day | ORAL | Status: DC

## 2014-03-04 ENCOUNTER — Ambulatory Visit: Payer: Medicare HMO

## 2014-03-04 ENCOUNTER — Ambulatory Visit (INDEPENDENT_AMBULATORY_CARE_PROVIDER_SITE_OTHER): Payer: Medicare HMO | Admitting: Family Medicine

## 2014-03-04 DIAGNOSIS — D519 Vitamin B12 deficiency anemia, unspecified: Secondary | ICD-10-CM

## 2014-03-04 DIAGNOSIS — Z7901 Long term (current) use of anticoagulants: Secondary | ICD-10-CM

## 2014-03-04 DIAGNOSIS — D518 Other vitamin B12 deficiency anemias: Secondary | ICD-10-CM

## 2014-03-04 LAB — POCT INR: INR: 2.1

## 2014-03-04 MED ORDER — CYANOCOBALAMIN 1000 MCG/ML IJ SOLN
1000.0000 ug | Freq: Once | INTRAMUSCULAR | Status: AC
  Administered 2014-03-04: 1000 ug via INTRAMUSCULAR

## 2014-03-07 ENCOUNTER — Ambulatory Visit (INDEPENDENT_AMBULATORY_CARE_PROVIDER_SITE_OTHER): Payer: Medicare HMO | Admitting: Family Medicine

## 2014-03-07 ENCOUNTER — Encounter: Payer: Self-pay | Admitting: Family Medicine

## 2014-03-07 VITALS — BP 122/72 | HR 89 | Temp 97.6°F | Wt 127.5 lb

## 2014-03-07 DIAGNOSIS — L03119 Cellulitis of unspecified part of limb: Secondary | ICD-10-CM

## 2014-03-07 DIAGNOSIS — L02419 Cutaneous abscess of limb, unspecified: Secondary | ICD-10-CM

## 2014-03-07 MED ORDER — DOXYCYCLINE HYCLATE 100 MG PO TABS: 100.0000 mg | ORAL_TABLET | Freq: Two times a day (BID) | ORAL | Status: DC

## 2014-03-07 NOTE — Progress Notes (Signed)
Pre visit review using our clinic review tool, if applicable. No additional management support is needed unless otherwise documented below in the visit note.  Recently, last few days,  With R ankle and lower leg erythema.  Burning, sore. Some occ drainage. No fevers.  On the anterior side of shin, dorsum of foot. No acute/recent injury. No L foot sx.   Meds, vitals, and allergies reviewed.   ROS: See HPI.  Otherwise, noncontributory.  nad ncat Mmm rrr ctab abd soft R leg with blanching erythema and crusted skin on the anterior side of shin, dorsum of foot. Some scant drainage noted on bandage. No ulceration.  DP pulse intact. No sign of compartment syndrome. Calf not ttp

## 2014-03-07 NOTE — Patient Instructions (Signed)
Cut back to 2.5mg  of coumadin a day in the meantime, while on the doxycycline.  Recheck with Benjie Karvonen on Monday for your coumadin.  Take care. Glad to see you.

## 2014-03-08 DIAGNOSIS — L0390 Cellulitis, unspecified: Secondary | ICD-10-CM | POA: Insufficient documentation

## 2014-03-08 NOTE — Assessment & Plan Note (Signed)
Start doxy, border marked, cut coumadin to 2.5mg  a day.  F/u with Benjie Karvonen for coumadin clinic Monday.  Routine cautions given.  Dressed with nonstick bandage for comfort. Okay for outpatient f/u.

## 2014-03-11 ENCOUNTER — Ambulatory Visit (INDEPENDENT_AMBULATORY_CARE_PROVIDER_SITE_OTHER): Payer: Medicare HMO | Admitting: Family Medicine

## 2014-03-11 DIAGNOSIS — Z7901 Long term (current) use of anticoagulants: Secondary | ICD-10-CM

## 2014-03-11 LAB — POCT INR: INR: 1.5

## 2014-03-18 ENCOUNTER — Ambulatory Visit (INDEPENDENT_AMBULATORY_CARE_PROVIDER_SITE_OTHER): Payer: Commercial Managed Care - HMO | Admitting: Family Medicine

## 2014-03-18 ENCOUNTER — Encounter: Payer: Self-pay | Admitting: Family Medicine

## 2014-03-18 VITALS — BP 106/72 | HR 78 | Temp 97.5°F | Ht 67.0 in | Wt 127.8 lb

## 2014-03-18 DIAGNOSIS — L03119 Cellulitis of unspecified part of limb: Secondary | ICD-10-CM

## 2014-03-18 DIAGNOSIS — Z7901 Long term (current) use of anticoagulants: Secondary | ICD-10-CM

## 2014-03-18 DIAGNOSIS — L02419 Cutaneous abscess of limb, unspecified: Secondary | ICD-10-CM

## 2014-03-18 DIAGNOSIS — L03116 Cellulitis of left lower limb: Secondary | ICD-10-CM

## 2014-03-18 LAB — POCT INR: INR: 1.3

## 2014-03-18 NOTE — Assessment & Plan Note (Addendum)
Improving with doxycycline  Will monitor closely-finish abx Disc red flags- (inc red/swell/pain)- to call for  F/u in about a week for re check  Venous insuff may prolong healing a bit  INR today in light of abx

## 2014-03-18 NOTE — Patient Instructions (Signed)
Continue your antibiotics until they are done  Elevate your leg when ever you can  If symptoms worsen- ( redness/swelling/pain)- please let me know Follow up with me in about a week

## 2014-03-18 NOTE — Progress Notes (Signed)
Subjective:    Patient ID: [name], female    DOB: [birth year], 80 y.o.   MRN: [id number]  HPI Here for f/u of cellulitis - px doxycycline on 6/25  Still on it for 6 more days   It is improved- no longer having discharge  Redness is improved  Also swelling is improved   It burns a little bit - but that is also improved   Tolerating her doxycycline ok   Not wrapping it  Elevating it when she can  Not using any topical products Has bad varicose veins but not ulcer or open skin   Needs INR today due to abx   Patient Active Problem List   Diagnosis Date Noted  . Cellulitis 03/08/2014  . Chronic diastolic CHF (congestive heart failure) 01/04/2014  . Knee pain 08/27/2013  . Cough 07/03/2013  . Acute bronchitis 07/03/2013  . Orthostatic hypotension   . Acute on chronic combined systolic and diastolic heart failure 10/62/6948  . Acute renal failure 02/28/2013  . Dehydration 02/28/2013  . Shingles 02/28/2013  . Protein-calorie malnutrition, severe 02/28/2013  . Nail fungus 01/10/2013  . Productive cough 02/29/2012  . Nausea   . Left shoulder pain 10/06/2011  . Lumbar pain 10/06/2011  . Hyperlipidemia 07/09/2011  . Vision blurred 07/09/2011  . Hearing loss 07/09/2011  . Weight loss   . A-fib   . Tachycardia   . CAD (coronary artery disease)   . Mitral regurgitation   . SOB (shortness of breath)   . LBBB (left bundle branch block)   . Warfarin anticoagulation   . Ejection fraction   . TR (tricuspid regurgitation)   . Dyslipidemia   . Lung abnormality   . Prolonged QT interval   . Volume overload   . Coccydynia 12/03/2010  . Nonspecific (abnormal) findings on radiological and other examination of body structure 07/13/2010  . ABNORMAL CHEST XRAY 07/13/2010  . Shortness of breath 06/18/2010  . HYPERTENSION 06/24/2009  . GOUT, UNSPECIFIED 03/20/2009  . GERD 03/18/2009  . THROMBOCYTOPENIA 01/03/2009  . ALLERGIC RHINITIS 12/05/2008  . HOARSENESS 06/07/2008    . MEDIASTINAL LYMPHADENOPATHY 05/13/2008  . Other Diseases of Lung, not Elsewhere Classified 04/18/2008  . VENOUS INSUFFICIENCY 09/13/2007  . FREQUENCY, URINARY 07/19/2007  . VERTIGO, BENIGN PAROXYSMAL POSITION 05/11/2007  . TINNITUS NOS 05/11/2007  . OSTEOPOROSIS NOS 05/11/2007  . HYPOTHYROIDISM 04/11/2007  . ANEMIA, B12 DEFICIENCY 04/11/2007  . HYPOXEMIA 04/11/2007   Past Medical History  Diagnosis Date  . A-fib     chronic  /  holter 06/2010, no brady, mild tachy  . Tachycardia     holter, 06/2010, no brady, mild  tachy  . CAD (coronary artery disease)     2 DES, [facility], 2004  . Pleural effusion associated with pulmonary infection 03-2007  . Hypothyroid   . Hyperlipidemia   . GERD (gastroesophageal reflux disease)   . Diverticulosis of colon   . Allergy   . Anemia   . Arthritis     osteo  . Hypertension   . COPD (chronic obstructive pulmonary disease)   . Mitral regurgitation   . Vitamin B deficiency   . Compression fracture of lumbar vertebra 05-2008  . Osteoporosis   . Thrombocytopenia   . SOB (shortness of breath)   . OA (osteoarthritis)   . LBBB (left bundle branch block)   . Hx of colonoscopy   . Warfarin anticoagulation   . Ejection fraction   . TR (tricuspid regurgitation)  moderate, echo 2008  . Dyslipidemia   . Lung abnormality     Dr [name] 122/2011 no further w/u needed  . Prolonged QT interval     when on sotolol in past  . Volume overload     intermittant  . Hypertension   . Weight loss     August, 2012  . Nausea     Some nausea with medications, May, 2013,  . Orthostatic hypotension     June, 2014  . Shingles    Past Surgical History  Procedure Laterality Date  . Thyroidectomy  1962  . Tonsillectomy     History  Substance Use Topics  . Smoking status: Former Smoker    Quit date: 09/14/1947  . Smokeless tobacco: Never Used  . Alcohol Use: No   Family History  Problem Relation Age of Onset  . Heart disease Mother 40  . Stroke  Father 31  . Heart disease Sister   . Cancer Brother     colon  . Diabetes Brother   . Leukemia Sister    Allergies  Allergen Reactions  . Alendronate Sodium     REACTION: GI  . Sulfonamide Derivatives     REACTION: sick   Current Outpatient Prescriptions on File Prior to Visit  Medication Sig Dispense Refill  . acetaminophen (TYLENOL ARTHRITIS PAIN) 650 MG CR tablet Take 650 mg by mouth every 8 (eight) hours as needed for pain.       . cyanocobalamin (,VITAMIN B-12,) 1000 MCG/ML injection Inject 1,000 mcg into the muscle every 30 (thirty) days.       [name] doxycycline (VIBRA-TABS) 100 MG tablet Take 1 tablet (100 mg total) by mouth 2 (two) times daily.  20 tablet  0  . feeding supplement (ENSURE) PUDG Take 1 Container by mouth 3 (three) times daily between meals.  150 g  10  . ferrous sulfate 325 (65 FE) MG tablet TAKE 1 TABLET EVERY DAY  90 tablet  3  . furosemide (LASIX) 20 MG tablet Take 1 tablet (20 mg total) by mouth as needed (For weight of 123 lbs. or greater).  90 tablet  0  . gabapentin (NEURONTIN) 300 MG capsule Take 1 capsule (300 mg total) by mouth 3 (three) times daily.  270 capsule  3  . glucosamine-chondroitin 500-400 MG tablet Take 1 tablet by mouth daily.        [name] levothyroxine (SYNTHROID, LEVOTHROID) 88 MCG tablet TAKE 1 TABLET (88 MCG TOTAL) BY MOUTH DAILY.  90 tablet  3  . Lutein 6 MG CAPS Take 1 capsule by mouth daily.       . Menthol (COUGH DROPS) 10 MG LOZG Use as directed in the mouth or throat as needed.      . metoprolol (LOPRESSOR) 50 MG tablet Take 0.5 tablets (25 mg total) by mouth 2 (two) times daily.  90 tablet  3  . midodrine (PROAMATINE) 5 MG tablet TAKE 1 TABLET BY MOUTH 2 TIMES A DAY WITH A MEAL  90 tablet  6  . Multiple Vitamin (MULTIVITAMIN) tablet Take 1 tablet by mouth daily.       . nitroGLYCERIN (NITROSTAT) 0.4 MG SL tablet Place 1 tablet (0.4 mg total) under the tongue every 5 (five) minutes as needed for chest pain.  25 tablet  0  . omeprazole  (PRILOSEC) 20 MG capsule TAKE 1 CAPSULE TWICE A DAY  180 capsule  3  . potassium chloride (K-DUR) 10 MEQ tablet Take 1 tablet (10 mEq total)  by mouth daily.  90 tablet  3  . simvastatin (ZOCOR) 20 MG tablet Take 0.5 tablets (10 mg total) by mouth every evening.  90 tablet  3  . warfarin (COUMADIN) 5 MG tablet Take 1 tablet (5 mg total) by mouth daily. 1 and 1/2 tablets on Wednesday  98 tablet  2   No current facility-administered medications on file prior to visit.    Review of Systems    Review of Systems  Constitutional: Negative for fever, appetite change, fatigue and unexpected weight change.  Eyes: Negative for pain and visual disturbance.  Respiratory: Negative for cough and shortness of breath.   Cardiovascular: Negative for cp or palpitations  Pos for varicose veins   Gastrointestinal: Negative for nausea, diarrhea and constipation.  Genitourinary: Negative for urgency and frequency.  Skin: Negative for pallor or rash  pos for redness on R lower leg Neurological: Negative for weakness, light-headedness, numbness and headaches.  Hematological: Negative for adenopathy. Does not bruise/bleed easily.  Psychiatric/Behavioral: Negative for dysphoric mood. The patient is not nervous/anxious.      Objective:   Physical Exam  Constitutional: She appears well-developed and well-nourished. No distress.  Frail appearing elderly female who is mentally sharp  Eyes: Conjunctivae and EOM are normal. Pupils are equal, round, and reactive to light.  Cardiovascular: Normal rate.  Exam reveals no gallop.   Varicosities bilat LE -compressible w/o ulceration   Pulmonary/Chest: Effort normal and breath sounds normal.  Musculoskeletal: She exhibits edema and tenderness.  Neurological: She is alert.  Skin: Skin is warm and dry. No rash noted. There is erythema. No pallor.  Erythema over lower 1/3 of R leg - with scant swelling and tenderness Mildly warm  No palpable cords  Some venous stasis  dermatitis-no drainage/ mild scale   Overall much improved per pt   Psychiatric: She has a normal mood and affect.          Assessment & Plan:   Problem List Items Addressed This Visit     Other   Cellulitis - Primary     Improving with doxycycline  Will monitor closely-finish abx Disc red flags- (inc red/swell/pain)- to call for  F/u in about a week for re check  Venous insuff may prolong healing a bit  INR today in light of abx

## 2014-03-18 NOTE — Progress Notes (Signed)
Pre visit review using our clinic review tool, if applicable. No additional management support is needed unless otherwise documented below in the visit note. 

## 2014-03-25 ENCOUNTER — Encounter: Payer: Self-pay | Admitting: Family Medicine

## 2014-03-25 ENCOUNTER — Ambulatory Visit (INDEPENDENT_AMBULATORY_CARE_PROVIDER_SITE_OTHER): Payer: Commercial Managed Care - HMO | Admitting: Family Medicine

## 2014-03-25 VITALS — BP 106/68 | HR 80 | Temp 97.4°F | Ht 67.0 in | Wt 126.5 lb

## 2014-03-25 DIAGNOSIS — Z7901 Long term (current) use of anticoagulants: Secondary | ICD-10-CM

## 2014-03-25 DIAGNOSIS — L03115 Cellulitis of right lower limb: Secondary | ICD-10-CM

## 2014-03-25 DIAGNOSIS — L02419 Cutaneous abscess of limb, unspecified: Secondary | ICD-10-CM

## 2014-03-25 DIAGNOSIS — I83893 Varicose veins of bilateral lower extremities with other complications: Secondary | ICD-10-CM

## 2014-03-25 DIAGNOSIS — I8310 Varicose veins of unspecified lower extremity with inflammation: Secondary | ICD-10-CM | POA: Insufficient documentation

## 2014-03-25 DIAGNOSIS — L03119 Cellulitis of unspecified part of limb: Secondary | ICD-10-CM

## 2014-03-25 LAB — POCT INR: INR: 1.2

## 2014-03-25 NOTE — Progress Notes (Signed)
Pre visit review using our clinic review tool, if applicable. No additional management support is needed unless otherwise documented below in the visit note. 

## 2014-03-25 NOTE — Patient Instructions (Signed)
I think your skin infection is much improved No more antibiotics for now - but watch for increasing redness or swelling or pain or fever  Elevate legs whenever you can  Wear support stockings during the day for your varicose veins -this should help also

## 2014-03-25 NOTE — Assessment & Plan Note (Signed)
Of R ankle/leg-much imp after finishing doxycycline Would benefit from compression of venous insuff also  Written for knee high supp hose Update if not starting to improve in a week or if worsening

## 2014-03-25 NOTE — Assessment & Plan Note (Signed)
This is worsening her risk of cellulitis (also has venous stasis dermatitis) Recommend supp stockings  She can try hers or fill px for ones to the knee (15-20 mm Hg) Elevate legs Update if no further imp

## 2014-03-25 NOTE — Progress Notes (Signed)
Subjective:    Patient ID: [name], female    DOB: [birth year], 80 y.o.   MRN: [id number]  HPI Just finished the doxycycline for cellulitis of R leg Burns a bit and itches Improved overall -not as irritated Not as swollen - much improved   No fever   Needs INR check today  Feeling good in general  She does have support stockings -panty hose type- they are hot and she does not like to wear them in the summer     Patient Active Problem List   Diagnosis Date Noted  . Cellulitis 03/08/2014  . Chronic diastolic CHF (congestive heart failure) 01/04/2014  . Knee pain 08/27/2013  . Cough 07/03/2013  . Acute bronchitis 07/03/2013  . Orthostatic hypotension   . Acute on chronic combined systolic and diastolic heart failure 26/83/4196  . Acute renal failure 02/28/2013  . Dehydration 02/28/2013  . Shingles 02/28/2013  . Protein-calorie malnutrition, severe 02/28/2013  . Nail fungus 01/10/2013  . Productive cough 02/29/2012  . Nausea   . Left shoulder pain 10/06/2011  . Lumbar pain 10/06/2011  . Hyperlipidemia 07/09/2011  . Vision blurred 07/09/2011  . Hearing loss 07/09/2011  . Weight loss   . A-fib   . Tachycardia   . CAD (coronary artery disease)   . Mitral regurgitation   . SOB (shortness of breath)   . LBBB (left bundle branch block)   . Warfarin anticoagulation   . Ejection fraction   . TR (tricuspid regurgitation)   . Dyslipidemia   . Lung abnormality   . Prolonged QT interval   . Volume overload   . Coccydynia 12/03/2010  . Nonspecific (abnormal) findings on radiological and other examination of body structure 07/13/2010  . ABNORMAL CHEST XRAY 07/13/2010  . Shortness of breath 06/18/2010  . HYPERTENSION 06/24/2009  . GOUT, UNSPECIFIED 03/20/2009  . GERD 03/18/2009  . THROMBOCYTOPENIA 01/03/2009  . ALLERGIC RHINITIS 12/05/2008  . HOARSENESS 06/07/2008  . MEDIASTINAL LYMPHADENOPATHY 05/13/2008  . Other Diseases of Lung, not Elsewhere Classified  04/18/2008  . VENOUS INSUFFICIENCY 09/13/2007  . FREQUENCY, URINARY 07/19/2007  . VERTIGO, BENIGN PAROXYSMAL POSITION 05/11/2007  . TINNITUS NOS 05/11/2007  . OSTEOPOROSIS NOS 05/11/2007  . HYPOTHYROIDISM 04/11/2007  . ANEMIA, B12 DEFICIENCY 04/11/2007  . HYPOXEMIA 04/11/2007   Past Medical History  Diagnosis Date  . A-fib     chronic  /  holter 06/2010, no brady, mild tachy  . Tachycardia     holter, 06/2010, no brady, mild  tachy  . CAD (coronary artery disease)     2 DES, [facility], 2004  . Pleural effusion associated with pulmonary infection 03-2007  . Hypothyroid   . Hyperlipidemia   . GERD (gastroesophageal reflux disease)   . Diverticulosis of colon   . Allergy   . Anemia   . Arthritis     osteo  . Hypertension   . COPD (chronic obstructive pulmonary disease)   . Mitral regurgitation   . Vitamin B deficiency   . Compression fracture of lumbar vertebra 05-2008  . Osteoporosis   . Thrombocytopenia   . SOB (shortness of breath)   . OA (osteoarthritis)   . LBBB (left bundle branch block)   . Hx of colonoscopy   . Warfarin anticoagulation   . Ejection fraction   . TR (tricuspid regurgitation)     moderate, echo 2008  . Dyslipidemia   . Lung abnormality     Dr [name] 122/2011 no further w/u needed  . Prolonged  QT interval     when on sotolol in past  . Volume overload     intermittant  . Hypertension   . Weight loss     August, 2012  . Nausea     Some nausea with medications, May, 2013,  . Orthostatic hypotension     June, 2014  . Shingles    Past Surgical History  Procedure Laterality Date  . Thyroidectomy  1962  . Tonsillectomy     History  Substance Use Topics  . Smoking status: Former Smoker    Quit date: 09/14/1947  . Smokeless tobacco: Never Used  . Alcohol Use: No   Family History  Problem Relation Age of Onset  . Heart disease Mother 76  . Stroke Father 45  . Heart disease Sister   . Cancer Brother     colon  . Diabetes Brother   .  Leukemia Sister    Allergies  Allergen Reactions  . Alendronate Sodium     REACTION: GI  . Sulfonamide Derivatives     REACTION: sick   Current Outpatient Prescriptions on File Prior to Visit  Medication Sig Dispense Refill  . acetaminophen (TYLENOL ARTHRITIS PAIN) 650 MG CR tablet Take 650 mg by mouth every 8 (eight) hours as needed for pain.       . cyanocobalamin (,VITAMIN B-12,) 1000 MCG/ML injection Inject 1,000 mcg into the muscle every 30 (thirty) days.       . feeding supplement (ENSURE) PUDG Take 1 Container by mouth 3 (three) times daily between meals.  150 g  10  . ferrous sulfate 325 (65 FE) MG tablet TAKE 1 TABLET EVERY DAY  90 tablet  3  . furosemide (LASIX) 20 MG tablet Take 1 tablet (20 mg total) by mouth as needed (For weight of 123 lbs. or greater).  90 tablet  0  . gabapentin (NEURONTIN) 300 MG capsule Take 1 capsule (300 mg total) by mouth 3 (three) times daily.  270 capsule  3  . glucosamine-chondroitin 500-400 MG tablet Take 1 tablet by mouth daily.        [name] levothyroxine (SYNTHROID, LEVOTHROID) 88 MCG tablet TAKE 1 TABLET (88 MCG TOTAL) BY MOUTH DAILY.  90 tablet  3  . Lutein 6 MG CAPS Take 1 capsule by mouth daily.       . Menthol (COUGH DROPS) 10 MG LOZG Use as directed in the mouth or throat as needed.      . metoprolol (LOPRESSOR) 50 MG tablet Take 0.5 tablets (25 mg total) by mouth 2 (two) times daily.  90 tablet  3  . midodrine (PROAMATINE) 5 MG tablet TAKE 1 TABLET BY MOUTH 2 TIMES A DAY WITH A MEAL  90 tablet  6  . Multiple Vitamin (MULTIVITAMIN) tablet Take 1 tablet by mouth daily.       . nitroGLYCERIN (NITROSTAT) 0.4 MG SL tablet Place 1 tablet (0.4 mg total) under the tongue every 5 (five) minutes as needed for chest pain.  25 tablet  0  . omeprazole (PRILOSEC) 20 MG capsule TAKE 1 CAPSULE TWICE A DAY  180 capsule  3  . potassium chloride (K-DUR) 10 MEQ tablet Take 1 tablet (10 mEq total) by mouth daily.  90 tablet  3  . simvastatin (ZOCOR) 20 MG tablet  Take 0.5 tablets (10 mg total) by mouth every evening.  90 tablet  3  . warfarin (COUMADIN) 5 MG tablet Take 1 tablet (5 mg total) by mouth daily. 1 and 1/2  tablets on Wednesday  98 tablet  2   No current facility-administered medications on file prior to visit.      Review of Systems Review of Systems  Constitutional: Negative for fever, appetite change, fatigue and unexpected weight change.  Eyes: Negative for pain and visual disturbance.  Respiratory: Negative for cough and shortness of breath.   Cardiovascular: Negative for cp or palpitations   pos for varicosities in lower legs  Gastrointestinal: Negative for nausea, diarrhea and constipation.  Genitourinary: Negative for urgency and frequency.  Skin: Negative for pallor or rash   Neurological: Negative for weakness, light-headedness, numbness and headaches.  Hematological: Negative for adenopathy. Does not bruise/bleed easily.  Psychiatric/Behavioral: Negative for dysphoric mood. The patient is not nervous/anxious.         Objective:   Physical Exam  Constitutional: She appears well-developed and well-nourished.  HENT:  Head: Normocephalic and atraumatic.  Eyes: Conjunctivae and EOM are normal. Pupils are equal, round, and reactive to light. No scleral icterus.  Neck: Normal range of motion. Neck supple.  Cardiovascular: Normal rate.   Pulmonary/Chest: Effort normal and breath sounds normal.  No crackles   Musculoskeletal: She exhibits edema. She exhibits no tenderness.  Lymphadenopathy:    She has no cervical adenopathy.  Neurological: She is alert.  Skin: Skin is warm and dry. No rash noted. There is erythema. No pallor.  Erythema on R ankle and shin is much improved and swelling is gone Non tender Some notable varicosities bilaterally (compressible) Also some venous stasis dermatitis with scale on R ankle   Psychiatric: She has a normal mood and affect.          Assessment & Plan:   Problem List Items  Addressed This Visit     Cardiovascular and Mediastinum   Varicose veins of lower extremities with other complications     This is worsening her risk of cellulitis (also has venous stasis dermatitis) Recommend supp stockings  She can try hers or fill px for ones to the knee (15-20 mm Hg) Elevate legs Update if no further imp      Other   Cellulitis - Primary     Of R ankle/leg-much imp after finishing doxycycline Would benefit from compression of venous insuff also  Written for knee high supp hose Update if not starting to improve in a week or if worsening

## 2014-04-03 ENCOUNTER — Other Ambulatory Visit: Payer: Self-pay | Admitting: Cardiology

## 2014-04-15 ENCOUNTER — Ambulatory Visit: Payer: Medicare HMO

## 2014-04-22 ENCOUNTER — Ambulatory Visit (INDEPENDENT_AMBULATORY_CARE_PROVIDER_SITE_OTHER): Payer: Commercial Managed Care - HMO | Admitting: Family Medicine

## 2014-04-22 DIAGNOSIS — Z7901 Long term (current) use of anticoagulants: Secondary | ICD-10-CM

## 2014-04-22 LAB — POCT INR: INR: 1.9

## 2014-05-08 ENCOUNTER — Other Ambulatory Visit: Payer: Self-pay | Admitting: Cardiology

## 2014-05-23 ENCOUNTER — Ambulatory Visit (INDEPENDENT_AMBULATORY_CARE_PROVIDER_SITE_OTHER): Payer: Commercial Managed Care - HMO | Admitting: Family Medicine

## 2014-05-23 DIAGNOSIS — D519 Vitamin B12 deficiency anemia, unspecified: Secondary | ICD-10-CM

## 2014-05-23 DIAGNOSIS — Z7901 Long term (current) use of anticoagulants: Secondary | ICD-10-CM

## 2014-05-23 DIAGNOSIS — D518 Other vitamin B12 deficiency anemias: Secondary | ICD-10-CM

## 2014-05-23 LAB — POCT INR: INR: 1.9

## 2014-05-23 MED ORDER — CYANOCOBALAMIN 1000 MCG/ML IJ SOLN
1000.0000 ug | Freq: Once | INTRAMUSCULAR | Status: AC
  Administered 2014-05-23: 1000 ug via INTRAMUSCULAR

## 2014-06-06 ENCOUNTER — Other Ambulatory Visit: Payer: Self-pay

## 2014-06-06 ENCOUNTER — Telehealth: Payer: Self-pay | Admitting: Cardiology

## 2014-06-06 MED ORDER — FUROSEMIDE 20 MG PO TABS: ORAL_TABLET | ORAL | Status: DC

## 2014-06-06 NOTE — Telephone Encounter (Signed)
I left a detailed message on the pts daughter's VM and I will call her tomorrow to be sure she got my message.

## 2014-06-06 NOTE — Telephone Encounter (Addendum)
At the pts last OV with Dr Ron Parker on 01/04/14 she was advised to only take Lasix 20 mg as needed for weight of 123 lbs or greater. The pt's daughter, Despina Pole, states that the pt may be gaining body weight as she is eating really well 3 times daily at the facility she is in and that the pt denies any SOB or edema. Please advise.

## 2014-06-06 NOTE — Telephone Encounter (Signed)
Lets  change the plan. Have her take her Lasix if her weight is over 126.

## 2014-06-06 NOTE — Telephone Encounter (Signed)
New message    Daughter calling has question regarding her mother gaining weight & lasix. Was asked if she was having any sob. Happen one night .    9/1 126 - took lasix  9/2 125.- took lasix  9/3 125.4 - no meds  9/4 125.8 - took lasix . 9/5 126. - took lasix.  9/6 126- no lasix  9/7 126.4 - no lasix 9/8 126.8- no lasix 9/9 127.4 - took meds 9/10 126.8 - no meds .   9/11 126.4 - took lasix  9/12 126.2 - no med 9/13 128.8 - took lasix  9/14 125.- took lasix  9/15 125.4 - took lasix 9/16 125- took lasix  9/17 125.4- took lasix  9/18 125.4- took lasix  9/19 125.4 - no med 9/20 125. - no lasix 9/21 126.4 - no meds 9/22 127.2- took lasix

## 2014-06-07 NOTE — Telephone Encounter (Signed)
**Note De-Identified  Obfuscation** [name] is advised and she verbalized understanding.

## 2014-06-19 ENCOUNTER — Encounter: Payer: Self-pay | Admitting: Family Medicine

## 2014-06-19 ENCOUNTER — Ambulatory Visit (INDEPENDENT_AMBULATORY_CARE_PROVIDER_SITE_OTHER): Payer: Commercial Managed Care - HMO | Admitting: Family Medicine

## 2014-06-19 ENCOUNTER — Ambulatory Visit (INDEPENDENT_AMBULATORY_CARE_PROVIDER_SITE_OTHER)
Admission: RE | Admit: 2014-06-19 | Discharge: 2014-06-19 | Disposition: A | Discharge status: Home / Self Care | Payer: Commercial Managed Care - HMO | Source: Ambulatory Visit | Attending: Family Medicine | Admitting: Family Medicine

## 2014-06-19 VITALS — BP 112/78 | HR 88 | Temp 97.4°F | Ht 67.0 in | Wt 130.0 lb

## 2014-06-19 DIAGNOSIS — I872 Venous insufficiency (chronic) (peripheral): Secondary | ICD-10-CM

## 2014-06-19 DIAGNOSIS — L03115 Cellulitis of right lower limb: Secondary | ICD-10-CM

## 2014-06-19 DIAGNOSIS — I8311 Varicose veins of right lower extremity with inflammation: Secondary | ICD-10-CM

## 2014-06-19 DIAGNOSIS — Z23 Encounter for immunization: Secondary | ICD-10-CM

## 2014-06-19 DIAGNOSIS — I8310 Varicose veins of unspecified lower extremity with inflammation: Secondary | ICD-10-CM

## 2014-06-19 DIAGNOSIS — I8312 Varicose veins of left lower extremity with inflammation: Secondary | ICD-10-CM

## 2014-06-19 MED ORDER — DOXYCYCLINE HYCLATE 100 MG PO TABS
100.0000 mg | ORAL_TABLET | Freq: Two times a day (BID) | ORAL | Status: DC
Start: 2014-06-19 — End: 2014-07-08

## 2014-06-19 NOTE — Assessment & Plan Note (Signed)
Ongoing problems  Using otc supp stockings-intol of px ones  Disc elevation Has recurrent cellulitis of R leg and venous stasis dermatitis  Ref to vasc/vein clinic

## 2014-06-19 NOTE — Patient Instructions (Addendum)
Use a fragrance and color free moisturizer on legs -like lubriderm  Elevate legs whenever possible  Wear your support stockings Take doxycycline as directed  Xray today  Stop at check out for vascular referral  Get protime on Monday as planned

## 2014-06-19 NOTE — Assessment & Plan Note (Signed)
tx with doxycycline-has worked well in the past  Aware it may change PT /INR - has appt to have that checked  Elevate/supp hose xr to r/o osteo This is recurrent and likely rel to her venous insuff

## 2014-06-19 NOTE — Progress Notes (Signed)
Pre visit review using our clinic review tool, if applicable. No additional management support is needed unless otherwise documented below in the visit note. 

## 2014-06-19 NOTE — Progress Notes (Signed)
Subjective:    Patient ID: [name], female    DOB: 1921-11-20, 80 y.o.   MRN: [id number]  HPI Here with problems with her R leg/ankle  Has had cellulitis in the past  It got better for a while and then came back  Could not get px support on -too difficult  So she wears otc stockings with support   Red and painful over medial need and ankle - going on a few weeks now  Pt mentions -that it never entirely got better at the heel (improved however)  No fever   Feeling ok   Has some dry skin on both ankles - but no cracking or blistering   Patient Active Problem List   Diagnosis Date Noted  . Venous stasis dermatitis 06/19/2014  . Varicose veins of lower extremities with inflammation 03/25/2014  . Cellulitis 03/08/2014  . Chronic diastolic CHF (congestive heart failure) 01/04/2014  . Knee pain 08/27/2013  . Cough 07/03/2013  . Acute bronchitis 07/03/2013  . Orthostatic hypotension   . Acute on chronic combined systolic and diastolic heart failure 38/06/1750  . Acute renal failure 02/28/2013  . Dehydration 02/28/2013  . Shingles 02/28/2013  . Protein-calorie malnutrition, severe 02/28/2013  . Nail fungus 01/10/2013  . Productive cough 02/29/2012  . Nausea   . Left shoulder pain 10/06/2011  . Lumbar pain 10/06/2011  . Hyperlipidemia 07/09/2011  . Vision blurred 07/09/2011  . Hearing loss 07/09/2011  . Weight loss   . A-fib   . Tachycardia   . CAD (coronary artery disease)   . Mitral regurgitation   . SOB (shortness of breath)   . LBBB (left bundle branch block)   . Warfarin anticoagulation   . Ejection fraction   . TR (tricuspid regurgitation)   . Dyslipidemia   . Lung abnormality   . Prolonged QT interval   . Volume overload   . Coccydynia 12/03/2010  . Nonspecific (abnormal) findings on radiological and other examination of body structure 07/13/2010  . ABNORMAL CHEST XRAY 07/13/2010  . Shortness of breath 06/18/2010  . HYPERTENSION 06/24/2009  .  GOUT, UNSPECIFIED 03/20/2009  . GERD 03/18/2009  . THROMBOCYTOPENIA 01/03/2009  . ALLERGIC RHINITIS 12/05/2008  . HOARSENESS 06/07/2008  . MEDIASTINAL LYMPHADENOPATHY 05/13/2008  . Other Diseases of Lung, not Elsewhere Classified 04/18/2008  . VENOUS INSUFFICIENCY 09/13/2007  . FREQUENCY, URINARY 07/19/2007  . VERTIGO, BENIGN PAROXYSMAL POSITION 05/11/2007  . TINNITUS NOS 05/11/2007  . OSTEOPOROSIS NOS 05/11/2007  . HYPOTHYROIDISM 04/11/2007  . ANEMIA, B12 DEFICIENCY 04/11/2007  . HYPOXEMIA 04/11/2007   Past Medical History  Diagnosis Date  . A-fib     chronic  /  holter 06/2010, no brady, mild tachy  . Tachycardia     holter, 06/2010, no brady, mild  tachy  . CAD (coronary artery disease)     2 DES, [facility], 2004  . Pleural effusion associated with pulmonary infection 03-2007  . Hypothyroid   . Hyperlipidemia   . GERD (gastroesophageal reflux disease)   . Diverticulosis of colon   . Allergy   . Anemia   . Arthritis     osteo  . Hypertension   . COPD (chronic obstructive pulmonary disease)   . Mitral regurgitation   . Vitamin B deficiency   . Compression fracture of lumbar vertebra 05-2008  . Osteoporosis   . Thrombocytopenia   . SOB (shortness of breath)   . OA (osteoarthritis)   . LBBB (left bundle branch block)   . Hx of  colonoscopy   . Warfarin anticoagulation   . Ejection fraction   . TR (tricuspid regurgitation)     moderate, echo 2008  . Dyslipidemia   . Lung abnormality     Dr [name] 122/2011 no further w/u needed  . Prolonged QT interval     when on sotolol in past  . Volume overload     intermittant  . Hypertension   . Weight loss     August, 2012  . Nausea     Some nausea with medications, May, 2013,  . Orthostatic hypotension     June, 2014  . Shingles    Past Surgical History  Procedure Laterality Date  . Thyroidectomy  1962  . Tonsillectomy     History  Substance Use Topics  . Smoking status: Former Smoker    Quit date: 09/14/1947  .  Smokeless tobacco: Never Used  . Alcohol Use: No   Family History  Problem Relation Age of Onset  . Heart disease Mother 2  . Stroke Father 17  . Heart disease Sister   . Cancer Brother     colon  . Diabetes Brother   . Leukemia Sister    Allergies  Allergen Reactions  . Alendronate Sodium     REACTION: GI  . Sulfonamide Derivatives     REACTION: sick   Current Outpatient Prescriptions on File Prior to Visit  Medication Sig Dispense Refill  . acetaminophen (TYLENOL ARTHRITIS PAIN) 650 MG CR tablet Take 650 mg by mouth every 8 (eight) hours as needed for pain.       . cyanocobalamin (,VITAMIN B-12,) 1000 MCG/ML injection Inject 1,000 mcg into the muscle every 30 (thirty) days.       . feeding supplement (ENSURE) PUDG Take 1 Container by mouth 3 (three) times daily between meals.  150 g  10  . ferrous sulfate 325 (65 FE) MG tablet TAKE 1 TABLET EVERY DAY  90 tablet  3  . furosemide (LASIX) 20 MG tablet Take 1 tablet as needed for weight of 123 lbs or greater  90 tablet  0  . gabapentin (NEURONTIN) 300 MG capsule Take 1 capsule (300 mg total) by mouth 3 (three) times daily.  270 capsule  3  . glucosamine-chondroitin 500-400 MG tablet Take 1 tablet by mouth daily.        [name] levothyroxine (SYNTHROID, LEVOTHROID) 88 MCG tablet TAKE 1 TABLET (88 MCG TOTAL) BY MOUTH DAILY.  90 tablet  3  . Lutein 6 MG CAPS Take 1 capsule by mouth daily.       . Menthol (COUGH DROPS) 10 MG LOZG Use as directed in the mouth or throat as needed.      . metoprolol (LOPRESSOR) 50 MG tablet Take 0.5 tablets (25 mg total) by mouth 2 (two) times daily.  90 tablet  3  . midodrine (PROAMATINE) 5 MG tablet TAKE 1 TABLET TWICE A DAY WITH A MEAL  180 tablet  1  . Multiple Vitamin (MULTIVITAMIN) tablet Take 1 tablet by mouth daily.       . nitroGLYCERIN (NITROSTAT) 0.4 MG SL tablet Place 1 tablet (0.4 mg total) under the tongue every 5 (five) minutes as needed for chest pain.  25 tablet  0  . omeprazole (PRILOSEC) 20  MG capsule TAKE 1 CAPSULE TWICE A DAY  180 capsule  3  . potassium chloride (K-DUR) 10 MEQ tablet Take 1 tablet (10 mEq total) by mouth daily.  90 tablet  3  . simvastatin (  ZOCOR) 20 MG tablet Take 0.5 tablets (10 mg total) by mouth every evening.  90 tablet  3  . warfarin (COUMADIN) 5 MG tablet Take 1 tablet (5 mg total) by mouth daily. 1 and 1/2 tablets on Wednesday  98 tablet  2   No current facility-administered medications on file prior to visit.    Review of Systems Review of Systems  Constitutional: Negative for fever, appetite change, fatigue and unexpected weight change.  Eyes: Negative for pain and visual disturbance.  Respiratory: Negative for cough and shortness of breath.   Cardiovascular: Negative for cp or palpitations   pos for LE varicosities  Gastrointestinal: Negative for nausea, diarrhea and constipation.  Genitourinary: Negative for urgency and frequency.  Skin: Negative for pallor and pos for dry skin on legs/ redness and swelling of R ankle  Neurological: Negative for weakness, light-headedness, numbness and headaches.  Hematological: Negative for adenopathy. Does not bruise/bleed easily.  Psychiatric/Behavioral: Negative for dysphoric mood. The patient is not nervous/anxious.         Objective:   Physical Exam  Constitutional: She appears well-developed and well-nourished. No distress.  Slim and frail appearing elderly female   HENT:  Head: Normocephalic and atraumatic.  Mouth/Throat: Oropharynx is clear and moist.  Eyes: Conjunctivae and EOM are normal. Pupils are equal, round, and reactive to light. No scleral icterus.  Neck: Normal range of motion. Neck supple. No JVD present. No thyromegaly present.  Cardiovascular: Normal rate and intact distal pulses.   LE varicosities of different sizes   Pulmonary/Chest: Effort normal and breath sounds normal. No respiratory distress. She has no wheezes. She has no rales.  No crackles   Musculoskeletal: She exhibits  edema and tenderness.  L dorsal/ medial foot and ankle are swollen with erythema  No drainage or skin interruption   Foot is covered in small varicosities that are compressible   Larger varicosities on both LEs   Lymphadenopathy:    She has no cervical adenopathy.  Neurological: She is alert.  Skin: Skin is warm and dry. Rash noted. There is erythema. No pallor.  Psychiatric: She has a normal mood and affect.        Assessment & Plan:   Problem List Items Addressed This Visit     Cardiovascular and Mediastinum   Varicose veins of lower extremities with inflammation     Ongoing problems  Using otc supp stockings-intol of px ones  Disc elevation Has recurrent cellulitis of R leg and venous stasis dermatitis  Ref to vasc/vein clinic     Relevant Orders      Ambulatory referral to Vascular Surgery     Musculoskeletal and Integument   Venous stasis dermatitis - Primary     Without skin interruption Does have cellulitis on R leg  Disc use of color and fragrance free moisturizers Could consider topical steroid in future if needed     Relevant Orders      Ambulatory referral to Vascular Surgery     Other   Cellulitis     tx with doxycycline-has worked well in the past  Aware it may change PT /INR - has appt to have that checked  Elevate/supp hose xr to r/o osteo This is recurrent and likely rel to her venous insuff     Relevant Orders      DG Ankle Complete Right (Completed)      DG Tibia/Fibula Right (Completed)    Other Visit Diagnoses   Need for prophylactic vaccination and inoculation  against influenza        Relevant Orders       Flu Vaccine QUAD 36+ mos PF IM (Fluarix Quad PF) (Completed)

## 2014-06-20 ENCOUNTER — Telehealth: Payer: Self-pay | Admitting: Family Medicine

## 2014-06-20 NOTE — Telephone Encounter (Signed)
Patient's daughter returned your call.  

## 2014-06-20 NOTE — Telephone Encounter (Signed)
See result note for imaging study.

## 2014-06-20 NOTE — Assessment & Plan Note (Signed)
Without skin interruption Does have cellulitis on R leg  Disc use of color and fragrance free moisturizers Could consider topical steroid in future if needed

## 2014-06-24 ENCOUNTER — Ambulatory Visit (INDEPENDENT_AMBULATORY_CARE_PROVIDER_SITE_OTHER): Payer: Commercial Managed Care - HMO | Admitting: *Deleted

## 2014-06-24 DIAGNOSIS — Z7901 Long term (current) use of anticoagulants: Secondary | ICD-10-CM

## 2014-06-24 DIAGNOSIS — E538 Deficiency of other specified B group vitamins: Secondary | ICD-10-CM

## 2014-06-24 LAB — POCT INR: INR: 2.2

## 2014-06-24 MED ORDER — CYANOCOBALAMIN 1000 MCG/ML IJ SOLN
1000.0000 ug | Freq: Once | INTRAMUSCULAR | Status: AC
  Administered 2014-06-24: 1000 ug via INTRAMUSCULAR

## 2014-06-24 MED ORDER — CYANOCOBALAMIN 1000 MCG/ML IJ SOLN: 1000.0000 ug | Freq: Once | INTRAMUSCULAR | Status: DC

## 2014-07-02 ENCOUNTER — Other Ambulatory Visit: Payer: Self-pay | Admitting: Cardiology

## 2014-07-05 ENCOUNTER — Encounter: Payer: Self-pay | Admitting: Vascular Surgery

## 2014-07-05 ENCOUNTER — Other Ambulatory Visit: Payer: Self-pay | Admitting: *Deleted

## 2014-07-05 DIAGNOSIS — I83893 Varicose veins of bilateral lower extremities with other complications: Secondary | ICD-10-CM

## 2014-07-05 DIAGNOSIS — M7989 Other specified soft tissue disorders: Secondary | ICD-10-CM

## 2014-07-08 ENCOUNTER — Encounter: Payer: Self-pay | Admitting: Cardiology

## 2014-07-08 ENCOUNTER — Ambulatory Visit (INDEPENDENT_AMBULATORY_CARE_PROVIDER_SITE_OTHER): Payer: Commercial Managed Care - HMO | Admitting: Cardiology

## 2014-07-08 VITALS — BP 116/60 | HR 90 | Ht 67.0 in | Wt 131.0 lb

## 2014-07-08 DIAGNOSIS — R9430 Abnormal result of cardiovascular function study, unspecified: Secondary | ICD-10-CM

## 2014-07-08 DIAGNOSIS — I2510 Atherosclerotic heart disease of native coronary artery without angina pectoris: Secondary | ICD-10-CM

## 2014-07-08 DIAGNOSIS — I5032 Chronic diastolic (congestive) heart failure: Secondary | ICD-10-CM

## 2014-07-08 DIAGNOSIS — E785 Hyperlipidemia, unspecified: Secondary | ICD-10-CM

## 2014-07-08 DIAGNOSIS — I8310 Varicose veins of unspecified lower extremity with inflammation: Secondary | ICD-10-CM

## 2014-07-08 DIAGNOSIS — R0989 Other specified symptoms and signs involving the circulatory and respiratory systems: Secondary | ICD-10-CM

## 2014-07-08 DIAGNOSIS — Z7901 Long term (current) use of anticoagulants: Secondary | ICD-10-CM

## 2014-07-08 DIAGNOSIS — I4891 Unspecified atrial fibrillation: Secondary | ICD-10-CM

## 2014-07-08 LAB — BASIC METABOLIC PANEL
BUN: 20 mg/dL (ref 6–23)
CO2: 23 mEq/L (ref 19–32)
Calcium: 9.3 mg/dL (ref 8.4–10.5)
Chloride: 106 mEq/L (ref 96–112)
Creatinine, Ser: 1.2 mg/dL (ref 0.4–1.2)
GFR: 47.03 mL/min — AB (ref >60.00)
Glucose, Bld: 82 mg/dL (ref 70–99)
POTASSIUM: 4.8 meq/L (ref 3.5–5.1)
Sodium: 142 mEq/L (ref 135–145)

## 2014-07-08 NOTE — Assessment & Plan Note (Signed)
She is gaining true bodyweight. I have been increasing her dry weight for diuretic purposes. As of today, we've changed her home dry weight to 127 pounds. I have instructed the patient and her daughter that she should take her diuretic if her weight is 127 pounds or higher. If her weight is below 127 she should not take it. On the days that she delays the timing of her dose because of being away from her center, she should take the diuretic when she gets home if it meets weight criteria.

## 2014-07-08 NOTE — Assessment & Plan Note (Signed)
Lipids are being treated. No change in therapy.

## 2014-07-08 NOTE — Patient Instructions (Signed)
**Note De-Identified  Obfuscation** Your physician recommends that you continue on your current medications as directed. Please refer to the Current Medication list given to you today.  Your physician recommends that you return for lab work in: today  Your physician recommends that you schedule a follow-up appointment in: 6 weeks  Your new dry weight is 127 lbs. Please call Jeani Hawking at [phone] in 1 week to discuss.

## 2014-07-08 NOTE — Assessment & Plan Note (Signed)
Rate is controlled. She is anticoagulated. No change in therapy.

## 2014-07-08 NOTE — Assessment & Plan Note (Signed)
She had some inflammation recently. This was helped with antibiotics. She is improved. She only has trace peripheral edema today. She and her daughter would like to not proceed with vascular evaluation if it is not necessary at this time. Since she is significantly improved, I have told them that it will be okay to cancel the appointment with vascular surgery. I will personally see the patient back in follow-up fairly soon.

## 2014-07-08 NOTE — Assessment & Plan Note (Signed)
We are continuing Coumadin.

## 2014-07-08 NOTE — Progress Notes (Signed)
Patient ID: [name], female   DOB: December 14, 1921, 80 y.o.   MRN: [id number]    HPI  Patient is seen today to follow-up atrial fibrillation and chronic diastolic CHF. She seems happy at the assisted-living area where she is now living. She is eating very well. She is actually gaining some weight. She is not having any chest pain. She has some exertional shortness of breath. There is no PND or orthopnea.  Recently she had some cellulitis and received an antibiotic. This area is much improved. At that time Dr. Glori Bickers made arrangements for her to see vascular surgery in the future for further evaluation. She and her daughter wanted to review this with me today because the patient is improved.  Allergies  Allergen Reactions  . Alendronate Sodium     REACTION: GI  . Sulfonamide Derivatives     REACTION: sick    Current Outpatient Prescriptions  Medication Sig Dispense Refill  . acetaminophen (TYLENOL ARTHRITIS PAIN) 650 MG CR tablet Take 650 mg by mouth every 8 (eight) hours as needed for pain.       . cyanocobalamin (,VITAMIN B-12,) 1000 MCG/ML injection Inject 1,000 mcg into the muscle every 30 (thirty) days.       . cyanocobalamin (,VITAMIN B-12,) 1000 MCG/ML injection Inject 1 mL (1,000 mcg total) into the muscle once.  1 mL  0  . feeding supplement (ENSURE) PUDG Take 1 Container by mouth 3 (three) times daily between meals.  150 g  10  . ferrous sulfate 325 (65 FE) MG tablet TAKE 1 TABLET EVERY DAY  90 tablet  3  . furosemide (LASIX) 20 MG tablet Take 1 tablet as needed for weight of 123 lbs or greater  90 tablet  0  . gabapentin (NEURONTIN) 300 MG capsule Take 1 capsule (300 mg total) by mouth 3 (three) times daily.  270 capsule  3  . glucosamine-chondroitin 500-400 MG tablet Take 1 tablet by mouth daily.        [name] levothyroxine (SYNTHROID, LEVOTHROID) 88 MCG tablet TAKE 1 TABLET (88 MCG TOTAL) BY MOUTH DAILY.  90 tablet  3  . Lutein 6 MG CAPS Take 1 capsule by mouth daily.       .  Menthol (COUGH DROPS) 10 MG LOZG Use as directed in the mouth or throat as needed.      . metoprolol (LOPRESSOR) 50 MG tablet Take 0.5 tablets (25 mg total) by mouth 2 (two) times daily.  90 tablet  3  . midodrine (PROAMATINE) 5 MG tablet TAKE ONE TABLET TWICE DAILY WITH A MEAL  180 tablet  0  . Multiple Vitamin (MULTIVITAMIN) tablet Take 1 tablet by mouth daily.       . nitroGLYCERIN (NITROSTAT) 0.4 MG SL tablet Place 1 tablet (0.4 mg total) under the tongue every 5 (five) minutes as needed for chest pain.  25 tablet  0  . omeprazole (PRILOSEC) 20 MG capsule TAKE 1 CAPSULE TWICE A DAY  180 capsule  3  . potassium chloride (K-DUR) 10 MEQ tablet Take 1 tablet (10 mEq total) by mouth daily.  90 tablet  3  . simvastatin (ZOCOR) 20 MG tablet Take 0.5 tablets (10 mg total) by mouth every evening.  90 tablet  3  . warfarin (COUMADIN) 5 MG tablet Take 5 mg by mouth daily. 1 and 1/2 tablets on Wednesday and Sundays       No current facility-administered medications for this visit.    History   Social  History  . Marital Status: Widowed    Spouse Name: N/A    Number of Children: N/A  . Years of Education: N/A   Occupational History  . Not on file.   Social History Main Topics  . Smoking status: Former Smoker    Quit date: 09/14/1947  . Smokeless tobacco: Never Used  . Alcohol Use: No  . Drug Use: No  . Sexual Activity: Not Currently   Other Topics Concern  . Not on file   Social History Narrative   Very independent    Lives in [place] by herself           Family History  Problem Relation Age of Onset  . Heart disease Mother 85  . Stroke Father 51  . Heart disease Sister   . Cancer Brother     colon  . Diabetes Brother   . Leukemia Sister     Past Medical History  Diagnosis Date  . A-fib     chronic  /  holter 06/2010, no brady, mild tachy  . Tachycardia     holter, 06/2010, no brady, mild  tachy  . CAD (coronary artery disease)     2 DES, [facility], 2004  . Pleural  effusion associated with pulmonary infection 03-2007  . Hypothyroid   . Hyperlipidemia   . GERD (gastroesophageal reflux disease)   . Diverticulosis of colon   . Allergy   . Anemia   . Arthritis     osteo  . Hypertension   . COPD (chronic obstructive pulmonary disease)   . Mitral regurgitation   . Vitamin B deficiency   . Compression fracture of lumbar vertebra 05-2008  . Osteoporosis   . Thrombocytopenia   . SOB (shortness of breath)   . OA (osteoarthritis)   . LBBB (left bundle branch block)   . Hx of colonoscopy   . Warfarin anticoagulation   . Ejection fraction   . TR (tricuspid regurgitation)     moderate, echo 2008  . Dyslipidemia   . Lung abnormality     Dr [name] 122/2011 no further w/u needed  . Prolonged QT interval     when on sotolol in past  . Volume overload     intermittant  . Hypertension   . Weight loss     August, 2012  . Nausea     Some nausea with medications, May, 2013,  . Orthostatic hypotension     June, 2014  . Shingles     Past Surgical History  Procedure Laterality Date  . Thyroidectomy  1962  . Tonsillectomy      Patient Active Problem List   Diagnosis Date Noted  . Weight loss     Priority: High  . A-fib     Priority: High  . Tachycardia     Priority: High  . CAD (coronary artery disease)     Priority: High  . Mitral regurgitation     Priority: High  . SOB (shortness of breath)     Priority: High  . LBBB (left bundle branch block)     Priority: High  . Warfarin anticoagulation     Priority: High  . Ejection fraction     Priority: High  . TR (tricuspid regurgitation)     Priority: High  . Dyslipidemia     Priority: High  . Prolonged QT interval     Priority: High  . Volume overload     Priority: High  . Venous stasis dermatitis 06/19/2014  .  Varicose veins of lower extremities with inflammation 03/25/2014  . Cellulitis 03/08/2014  . Chronic diastolic CHF (congestive heart failure) 01/04/2014  . Knee pain  08/27/2013  . Cough 07/03/2013  . Acute bronchitis 07/03/2013  . Orthostatic hypotension   . Acute on chronic combined systolic and diastolic heart failure 29/93/7169  . Acute renal failure 02/28/2013  . Dehydration 02/28/2013  . Shingles 02/28/2013  . Protein-calorie malnutrition, severe 02/28/2013  . Nail fungus 01/10/2013  . Productive cough 02/29/2012  . Nausea   . Left shoulder pain 10/06/2011  . Lumbar pain 10/06/2011  . Hyperlipidemia 07/09/2011  . Vision blurred 07/09/2011  . Hearing loss 07/09/2011  . Lung abnormality   . Coccydynia 12/03/2010  . Nonspecific (abnormal) findings on radiological and other examination of body structure 07/13/2010  . ABNORMAL CHEST XRAY 07/13/2010  . Shortness of breath 06/18/2010  . HYPERTENSION 06/24/2009  . GOUT, UNSPECIFIED 03/20/2009  . GERD 03/18/2009  . THROMBOCYTOPENIA 01/03/2009  . ALLERGIC RHINITIS 12/05/2008  . HOARSENESS 06/07/2008  . MEDIASTINAL LYMPHADENOPATHY 05/13/2008  . Other Diseases of Lung, not Elsewhere Classified 04/18/2008  . VENOUS INSUFFICIENCY 09/13/2007  . FREQUENCY, URINARY 07/19/2007  . VERTIGO, BENIGN PAROXYSMAL POSITION 05/11/2007  . TINNITUS NOS 05/11/2007  . OSTEOPOROSIS NOS 05/11/2007  . HYPOTHYROIDISM 04/11/2007  . ANEMIA, B12 DEFICIENCY 04/11/2007  . HYPOXEMIA 04/11/2007    ROS   Patient denies fever, chills, headache, sweats, rash, change in vision, change in hearing, chest pain, cough, nausea or vomiting, urinary symptoms. All other systems are reviewed and are negative.  PHYSICAL EXAM  Patient is oriented to person time and place. Affect is normal. She really looks good today. She looks stronger than I have seen her in the past. She is here with her daughter. Head is atraumatic. Sclera and conjunctiva are normal. There is no jugular venous distention. Lungs reveal a few scattered rhonchi. Cardiac exam reveals S1 and S2. The rhythm is irregularly irregular. The rate is controlled. Abdomen is  soft. There may be trace peripheral edema. She is wearing her stockings and I cannot see her skin color. However she says it is improved with only a very slight residual area of redness.  Filed Vitals:   07/08/14 1057  BP: 116/60  Pulse: 90  Height: 5\' 7"  (1.702 m)  Weight: 131 lb (59.421 kg)     ASSESSMENT & PLAN

## 2014-07-08 NOTE — Assessment & Plan Note (Signed)
She has known coronary disease. She is not having any significant symptoms. No further workup.

## 2014-07-15 ENCOUNTER — Telehealth: Payer: Self-pay | Admitting: Cardiology

## 2014-07-15 MED ORDER — FUROSEMIDE 20 MG PO TABS: ORAL_TABLET | ORAL | Status: DC

## 2014-07-15 NOTE — Telephone Encounter (Signed)
Dr. Ron Parker asked pt's dtr Windy Kalata to call with her weight, pls call [phone]

## 2014-07-15 NOTE — Telephone Encounter (Signed)
**Note De-Identified  Obfuscation** The pt was advised at her last OV with Dr Ron Parker on 10/26 to take Lasix 20 mg daily as needed for weights of 127 lbs. or greater. The pts daughter, [name], was asked to call us in 1 week with the pts weights. They are as follows:  07/09/14  Weight= 128.4 lbs. The pt took Lasix 20 mg today due to wt greater than 127 lbs. 07/10/14  Weight= 127.4 lbs.  The pt took Lasix 20 mg today due to wt greater than 127 lbs. 07/11/14  Weight= 127.4 lbs.  The pt took Lasix 20 mg today due to wt greater than 127 lbs. 07/12/14  Weight= 126.8 lbs.  The pt did not take a Lasix dose today. 07/13/14  Weight= 126.1 lbs.  The pt did not take a Lasix dose today. 07/14/14  Weight= 126.4 lbs.  The pt did not take a Lasix dose today. 07/15/14  Weight= 126.8 lbs.  The pt did not take a Lasix dose today.  Will forward to Dr Ron Parker for his review and recommendations, if any.

## 2014-07-16 ENCOUNTER — Encounter: Payer: Commercial Managed Care - HMO | Admitting: Vascular Surgery

## 2014-07-16 ENCOUNTER — Encounter (HOSPITAL_COMMUNITY): Payer: Commercial Managed Care - HMO

## 2014-07-19 NOTE — Telephone Encounter (Signed)
It looks like we are accomplishing what we want. Please touch base with the patient's daughter. Continue the same regimen.

## 2014-07-19 NOTE — Telephone Encounter (Signed)
The pts daughter, [name], is advised and she verbalized understanding. She expressed gratitude towards Dr Ron Parker.

## 2014-07-22 ENCOUNTER — Other Ambulatory Visit (INDEPENDENT_AMBULATORY_CARE_PROVIDER_SITE_OTHER): Payer: Commercial Managed Care - HMO

## 2014-07-22 DIAGNOSIS — E538 Deficiency of other specified B group vitamins: Secondary | ICD-10-CM

## 2014-07-22 DIAGNOSIS — I4891 Unspecified atrial fibrillation: Secondary | ICD-10-CM

## 2014-07-22 DIAGNOSIS — Z7901 Long term (current) use of anticoagulants: Secondary | ICD-10-CM

## 2014-07-22 LAB — PROTIME-INR
INR: 2.4 ratio — AB (ref 0.8–1.0)
Prothrombin Time: 26.2 s — ABNORMAL HIGH (ref 9.6–13.1)

## 2014-07-22 MED ORDER — CYANOCOBALAMIN 1000 MCG/ML IJ SOLN
1000.0000 ug | Freq: Once | INTRAMUSCULAR | Status: AC
  Administered 2014-07-22: 1000 ug via INTRAMUSCULAR

## 2014-07-24 ENCOUNTER — Telehealth: Payer: Self-pay | Admitting: Family Medicine

## 2014-07-24 NOTE — Telephone Encounter (Signed)
Pt daughter called request coumadin results. Informed her of Dr. Alba Cory comments and [name] agreed to keep dose the same and verbalized understanding. Made pt 1 mo f/u lab for coumadin due to [place] being out.

## 2014-07-30 ENCOUNTER — Encounter: Payer: Commercial Managed Care - HMO | Admitting: Vascular Surgery

## 2014-07-30 ENCOUNTER — Encounter (HOSPITAL_COMMUNITY): Payer: Commercial Managed Care - HMO

## 2014-08-12 ENCOUNTER — Other Ambulatory Visit: Payer: Self-pay | Admitting: Cardiology

## 2014-08-12 ENCOUNTER — Other Ambulatory Visit: Payer: Self-pay | Admitting: Family Medicine

## 2014-08-19 ENCOUNTER — Other Ambulatory Visit (INDEPENDENT_AMBULATORY_CARE_PROVIDER_SITE_OTHER): Payer: Commercial Managed Care - HMO

## 2014-08-19 DIAGNOSIS — I4891 Unspecified atrial fibrillation: Secondary | ICD-10-CM

## 2014-08-21 ENCOUNTER — Encounter: Payer: Self-pay | Admitting: Cardiology

## 2014-08-21 ENCOUNTER — Ambulatory Visit (INDEPENDENT_AMBULATORY_CARE_PROVIDER_SITE_OTHER): Payer: Commercial Managed Care - HMO | Admitting: Cardiology

## 2014-08-21 VITALS — BP 130/66 | HR 80 | Ht 67.0 in | Wt 122.4 lb

## 2014-08-21 DIAGNOSIS — I5032 Chronic diastolic (congestive) heart failure: Secondary | ICD-10-CM

## 2014-08-21 DIAGNOSIS — I482 Chronic atrial fibrillation, unspecified: Secondary | ICD-10-CM

## 2014-08-21 DIAGNOSIS — I951 Orthostatic hypotension: Secondary | ICD-10-CM

## 2014-08-21 NOTE — Assessment & Plan Note (Signed)
Fortunately she gets a good result from midodrine. I explained to her daughter that this medication is used to help with orthostasis. I explained that the metoprolol was not used for blood pressure. It is used for heart rate control with her atrial fibrillation.

## 2014-08-21 NOTE — Patient Instructions (Signed)
Your physician recommends that you continue on your current medications as directed. Please refer to the Current Medication list given to you today.  Your physician recommends that you schedule a follow-up appointment in: 3 months  

## 2014-08-21 NOTE — Assessment & Plan Note (Signed)
Her atrial fibrillation rate is controlled. She is anticoagulated. No change in therapy.

## 2014-08-21 NOTE — Assessment & Plan Note (Signed)
Her volume status is controlled. She takes extra Lasix if her weight goes over 127 pounds on her home scale. No change in therapy.

## 2014-08-21 NOTE — Progress Notes (Signed)
Patient ID: [name], female   DOB: 12/04/1921, 80 y.o.   MRN: [id number]    HPI Patient is seen today to follow-up atrial fibrillation and chronic diastolic CHF. She is actually doing very well at her assisted living. She is not having chest pain. Her walking is limited some by shortness of breath but she is actually quite stable.  Allergies  Allergen Reactions  . Alendronate Sodium     REACTION: GI  . Sulfonamide Derivatives     REACTION: sick    Current Outpatient Prescriptions  Medication Sig Dispense Refill  . acetaminophen (TYLENOL ARTHRITIS PAIN) 650 MG CR tablet Take 650 mg by mouth every 8 (eight) hours as needed for pain.     . feeding supplement (ENSURE) PUDG Take 1 Container by mouth 3 (three) times daily between meals. 150 g 10  . ferrous sulfate 325 (65 FE) MG tablet TAKE 1 TABLET EVERY DAY 90 tablet 3  . furosemide (LASIX) 20 MG tablet TAKE 1 TABLET AS NEEDED (FOR WEIGHT OF 121 LBS. OR GREATER). 90 tablet 0  . gabapentin (NEURONTIN) 300 MG capsule Take 1 capsule (300 mg total) by mouth 3 (three) times daily. 270 capsule 3  . glucosamine-chondroitin 500-400 MG tablet Take 1 tablet by mouth daily.      [name] levothyroxine (SYNTHROID, LEVOTHROID) 88 MCG tablet TAKE 1 TABLET EVERY DAY 90 tablet 0  . Lutein 6 MG CAPS Take 1 capsule by mouth daily.     . metoprolol (LOPRESSOR) 50 MG tablet TAKE 1/2 TABLET (25 MG TOTAL) TWICE DAILY 90 tablet 0  . midodrine (PROAMATINE) 5 MG tablet TAKE ONE TABLET TWICE DAILY WITH A MEAL 180 tablet 0  . Multiple Vitamin (MULTIVITAMIN) tablet Take 1 tablet by mouth daily.     . nitroGLYCERIN (NITROSTAT) 0.4 MG SL tablet Place 1 tablet (0.4 mg total) under the tongue every 5 (five) minutes as needed for chest pain. 25 tablet 0  . omeprazole (PRILOSEC) 20 MG capsule TAKE 1 CAPSULE TWICE DAILY 180 capsule 0  . potassium chloride (K-DUR,KLOR-CON) 10 MEQ tablet TAKE 1 TABLET EVERY DAY 90 tablet 0  . simvastatin (ZOCOR) 20 MG tablet Take 0.5 tablets  (10 mg total) by mouth every evening. 90 tablet 3  . warfarin (COUMADIN) 5 MG tablet Take 5 mg by mouth daily. 1 and 1/2 tablets on Wednesday and Sundays     No current facility-administered medications for this visit.    History   Social History  . Marital Status: Widowed    Spouse Name: N/A    Number of Children: N/A  . Years of Education: N/A   Occupational History  . Not on file.   Social History Main Topics  . Smoking status: Former Smoker    Quit date: 09/14/1947  . Smokeless tobacco: Never Used  . Alcohol Use: No  . Drug Use: No  . Sexual Activity: Not Currently   Other Topics Concern  . Not on file   Social History Narrative   Very independent    Lives in [place] by herself           Family History  Problem Relation Age of Onset  . Heart disease Mother 47  . Stroke Father 83  . Heart disease Sister   . Cancer Brother     colon  . Diabetes Brother   . Leukemia Sister     Past Medical History  Diagnosis Date  . A-fib     chronic  /  holter  06/2010, no brady, mild tachy  . Tachycardia     holter, 06/2010, no brady, mild  tachy  . CAD (coronary artery disease)     2 DES, [facility], 2004  . Pleural effusion associated with pulmonary infection 03-2007  . Hypothyroid   . Hyperlipidemia   . GERD (gastroesophageal reflux disease)   . Diverticulosis of colon   . Allergy   . Anemia   . Arthritis     osteo  . Hypertension   . COPD (chronic obstructive pulmonary disease)   . Mitral regurgitation   . Vitamin B deficiency   . Compression fracture of lumbar vertebra 05-2008  . Osteoporosis   . Thrombocytopenia   . SOB (shortness of breath)   . OA (osteoarthritis)   . LBBB (left bundle branch block)   . Hx of colonoscopy   . Warfarin anticoagulation   . Ejection fraction   . TR (tricuspid regurgitation)     moderate, echo 2008  . Dyslipidemia   . Lung abnormality     Dr [name] 122/2011 no further w/u needed  . Prolonged QT interval     when on sotolol  in past  . Volume overload     intermittant  . Hypertension   . Weight loss     August, 2012  . Nausea     Some nausea with medications, May, 2013,  . Orthostatic hypotension     June, 2014  . Shingles     Past Surgical History  Procedure Laterality Date  . Thyroidectomy  1962  . Tonsillectomy      Patient Active Problem List   Diagnosis Date Noted  . Weight loss     Priority: High  . A-fib     Priority: High  . Tachycardia     Priority: High  . CAD (coronary artery disease)     Priority: High  . Mitral regurgitation     Priority: High  . SOB (shortness of breath)     Priority: High  . LBBB (left bundle branch block)     Priority: High  . Warfarin anticoagulation     Priority: High  . Ejection fraction     Priority: High  . TR (tricuspid regurgitation)     Priority: High  . Dyslipidemia     Priority: High  . Prolonged QT interval     Priority: High  . Volume overload     Priority: High  . Venous stasis dermatitis 06/19/2014  . Varicose veins of lower extremities with inflammation 03/25/2014  . Cellulitis 03/08/2014  . Chronic diastolic CHF (congestive heart failure) 01/04/2014  . Knee pain 08/27/2013  . Cough 07/03/2013  . Acute bronchitis 07/03/2013  . Orthostatic hypotension   . Acute on chronic combined systolic and diastolic heart failure 69/48/5462  . Acute renal failure 02/28/2013  . Dehydration 02/28/2013  . Shingles 02/28/2013  . Protein-calorie malnutrition, severe 02/28/2013  . Nail fungus 01/10/2013  . Productive cough 02/29/2012  . Nausea   . Left shoulder pain 10/06/2011  . Lumbar pain 10/06/2011  . Hyperlipidemia 07/09/2011  . Vision blurred 07/09/2011  . Hearing loss 07/09/2011  . Lung abnormality   . Coccydynia 12/03/2010  . Nonspecific (abnormal) findings on radiological and other examination of body structure 07/13/2010  . ABNORMAL CHEST XRAY 07/13/2010  . Shortness of breath 06/18/2010  . HYPERTENSION 06/24/2009  . GOUT,  UNSPECIFIED 03/20/2009  . GERD 03/18/2009  . THROMBOCYTOPENIA 01/03/2009  . ALLERGIC RHINITIS 12/05/2008  . HOARSENESS 06/07/2008  .  MEDIASTINAL LYMPHADENOPATHY 05/13/2008  . Other Diseases of Lung, not Elsewhere Classified 04/18/2008  . VENOUS INSUFFICIENCY 09/13/2007  . FREQUENCY, URINARY 07/19/2007  . VERTIGO, BENIGN PAROXYSMAL POSITION 05/11/2007  . TINNITUS NOS 05/11/2007  . OSTEOPOROSIS NOS 05/11/2007  . HYPOTHYROIDISM 04/11/2007  . ANEMIA, B12 DEFICIENCY 04/11/2007  . HYPOXEMIA 04/11/2007    ROS  Patient denies fever, chills, headache, sweats, rash, change in vision, change in hearing, chest pain, cough, nausea or vomiting, urinary symptoms. All other systems are reviewed and are negative.  PHYSICAL EXAM Patient is oriented to person time and place. Affect is normal. She is here with her daughter as always. She really looks quite good. Head is atraumatic. Sclera and conjunctiva are normal. There is no jugulovenous distention. Lungs are clear. Respiratory effort is not labored. Cardiac exam Rales S1 and S2. Abdomen is soft. There is no peripheral edema.  Filed Vitals:   08/21/14 1536  BP: 130/66  Pulse: 80  Height: 5\' 7"  (1.702 m)  Weight: 122 lb 6.4 oz (55.52 kg)  SpO2: 96%     ASSESSMENT & PLAN

## 2014-09-13 DIAGNOSIS — I219 Acute myocardial infarction, unspecified: Secondary | ICD-10-CM

## 2014-09-13 HISTORY — DX: Acute myocardial infarction, unspecified: I21.9

## 2014-09-23 ENCOUNTER — Telehealth: Payer: Self-pay | Admitting: Family Medicine

## 2014-09-23 ENCOUNTER — Ambulatory Visit (INDEPENDENT_AMBULATORY_CARE_PROVIDER_SITE_OTHER): Payer: Commercial Managed Care - HMO | Admitting: Family Medicine

## 2014-09-23 DIAGNOSIS — Z7901 Long term (current) use of anticoagulants: Secondary | ICD-10-CM

## 2014-09-23 LAB — POCT INR: INR: 2

## 2014-09-23 MED ORDER — PANTOPRAZOLE SODIUM 40 MG PO TBEC: 40.0000 mg | DELAYED_RELEASE_TABLET | Freq: Every day | ORAL | Status: DC

## 2014-09-23 MED ORDER — PANTOPRAZOLE SODIUM 40 MG PO TBEC
40.0000 mg | DELAYED_RELEASE_TABLET | Freq: Every day | ORAL | Status: DC
Start: 2014-09-23 — End: 2014-09-23

## 2014-09-23 NOTE — Telephone Encounter (Signed)
Pt's daughter would like to know if pt can be switched from omeprazole to pantoprazole.  With her insurance, pantoprazole is a tier 1 drug and will cost pt less.  Pt's daughter requests cb and gives permission to leave message on her vm at [phone].

## 2014-09-23 NOTE — Telephone Encounter (Signed)
Rx sent to pharmacy and daughter notified of Dr. Marliss Coots comments

## 2014-09-23 NOTE — Telephone Encounter (Signed)
Px written for call in  Wrote for once daily since it is higher dose - but if that is not enough and symptoms return- let me know and I will write for bid  Thanks

## 2014-10-01 ENCOUNTER — Other Ambulatory Visit: Payer: Self-pay | Admitting: Cardiology

## 2014-10-24 ENCOUNTER — Other Ambulatory Visit (INDEPENDENT_AMBULATORY_CARE_PROVIDER_SITE_OTHER): Payer: Commercial Managed Care - HMO

## 2014-10-24 ENCOUNTER — Ambulatory Visit (INDEPENDENT_AMBULATORY_CARE_PROVIDER_SITE_OTHER): Payer: Commercial Managed Care - HMO | Admitting: Family Medicine

## 2014-10-24 ENCOUNTER — Encounter: Payer: Self-pay | Admitting: Family Medicine

## 2014-10-24 ENCOUNTER — Other Ambulatory Visit: Payer: Commercial Managed Care - HMO

## 2014-10-24 VITALS — BP 120/66 | HR 79 | Temp 97.7°F | Wt 130.5 lb

## 2014-10-24 DIAGNOSIS — M5431 Sciatica, right side: Secondary | ICD-10-CM

## 2014-10-24 DIAGNOSIS — Z7901 Long term (current) use of anticoagulants: Secondary | ICD-10-CM

## 2014-10-24 LAB — POCT INR: INR: 2.5

## 2014-10-24 MED ORDER — GABAPENTIN 300 MG PO CAPS: ORAL_CAPSULE | ORAL | Status: DC

## 2014-10-24 NOTE — Patient Instructions (Signed)
[name] will call about your referral. PT should help.  Take care.  Glad to see you.  Gradually increase the gabapentin as tolerated- 2-1-1, then 2-2-1 tabs a day.

## 2014-10-24 NOTE — Progress Notes (Signed)
Pre visit review using our clinic review tool, if applicable. No additional management support is needed unless otherwise documented below in the visit note.  R sided back, starts at the R "back pocket area" of her pants, goes down the R leg.  No L sided sx.  Going on intermittently for months.  Worse/triggered right after standing.  Taking tylenol.  Already on gabapentin.  H/o pelvic fx years ago, no recent falls.  No weakness.  No FCNAVD.  No bowel sx.    Prev L spine xrays 2015- Old fractures of T12, L3 and L4. T12 has been treated with vertebroplasty since the prior study.  Meds, vitals, and allergies reviewed.   ROS: See HPI.  Otherwise, noncontributory.  nad ncat Mmm Neck supple, no LA IRR, not tachy ctab abd soft not ttp Back w/o midline pain R SLR neg but that is sitting and as soon as she stands she has the pain return.  Still able to bear weight and distally sensation wnl

## 2014-10-25 DIAGNOSIS — M5431 Sciatica, right side: Secondary | ICD-10-CM | POA: Insufficient documentation

## 2014-10-25 NOTE — Assessment & Plan Note (Signed)
Continue tylenol, inc gapapentin to 4-5 tabs a day with routine cautions, as tolerated- 2-1-1, then 2-2-1 tabs a day.  Refer for PT.  D/w pt.  She and daughter agree.  Okay for outpatient f/u.  Routed to PCP as FYI.

## 2014-10-28 ENCOUNTER — Other Ambulatory Visit: Payer: Self-pay | Admitting: Family Medicine

## 2014-11-05 DIAGNOSIS — R262 Difficulty in walking, not elsewhere classified: Secondary | ICD-10-CM

## 2014-11-05 DIAGNOSIS — R2681 Unsteadiness on feet: Secondary | ICD-10-CM

## 2014-11-05 DIAGNOSIS — M5431 Sciatica, right side: Secondary | ICD-10-CM

## 2014-11-20 ENCOUNTER — Other Ambulatory Visit (INDEPENDENT_AMBULATORY_CARE_PROVIDER_SITE_OTHER): Payer: Commercial Managed Care - HMO

## 2014-11-20 ENCOUNTER — Telehealth: Payer: Self-pay

## 2014-11-20 DIAGNOSIS — Z7901 Long term (current) use of anticoagulants: Secondary | ICD-10-CM

## 2014-11-20 LAB — POCT INR: INR: 2.3

## 2014-11-20 NOTE — Telephone Encounter (Signed)
-----   Message from Abner Greenspan, MD sent at 11/20/2014  9:51 AM EST ----- No change in dose-please re check INR in 4 weeks

## 2014-11-20 NOTE — Telephone Encounter (Signed)
Left message for patient to call office regarding her INR results, no dose change and follow up in 4 weeks.

## 2014-11-21 ENCOUNTER — Other Ambulatory Visit: Payer: Commercial Managed Care - HMO

## 2014-11-29 ENCOUNTER — Ambulatory Visit: Payer: Commercial Managed Care - HMO | Admitting: Cardiology

## 2014-12-17 ENCOUNTER — Other Ambulatory Visit: Payer: Self-pay | Admitting: Cardiology

## 2014-12-19 ENCOUNTER — Other Ambulatory Visit: Payer: Commercial Managed Care - HMO

## 2014-12-20 ENCOUNTER — Ambulatory Visit (INDEPENDENT_AMBULATORY_CARE_PROVIDER_SITE_OTHER): Payer: Commercial Managed Care - HMO | Admitting: Cardiology

## 2014-12-20 ENCOUNTER — Other Ambulatory Visit (INDEPENDENT_AMBULATORY_CARE_PROVIDER_SITE_OTHER): Payer: Commercial Managed Care - HMO

## 2014-12-20 ENCOUNTER — Encounter: Payer: Self-pay | Admitting: Cardiology

## 2014-12-20 VITALS — BP 120/70 | HR 82 | Ht 67.0 in | Wt 128.0 lb

## 2014-12-20 DIAGNOSIS — I4891 Unspecified atrial fibrillation: Secondary | ICD-10-CM

## 2014-12-20 DIAGNOSIS — R0602 Shortness of breath: Secondary | ICD-10-CM

## 2014-12-20 DIAGNOSIS — Z7901 Long term (current) use of anticoagulants: Secondary | ICD-10-CM

## 2014-12-20 LAB — POCT INR: INR: 2

## 2014-12-20 NOTE — Progress Notes (Signed)
Cardiology Office Note   Date:  12/20/2014   ID:  [name], DOB Oct 16, 1921, MRN [id number]  PCP:  Loura Pardon, MD  Cardiologist:  Dola Argyle, MD   Chief Complaint  Patient presents with  . Appointment     follow-up atrial fibrillation      History of Present Illness: [name] is a 81 y.o. female who presents  Today to follow-up atrial fibrillation. She looks great. She's worried that she is gaining too much weight. She actually looks much more stable at her current weight. She's not having any significant chest pain or shortness of breath.    Past Medical History  Diagnosis Date  . A-fib     chronic  /  holter 06/2010, no brady, mild tachy  . Tachycardia     holter, 06/2010, no brady, mild  tachy  . CAD (coronary artery disease)     2 DES, [facility], 2004  . Pleural effusion associated with pulmonary infection 03-2007  . Hypothyroid   . Hyperlipidemia   . GERD (gastroesophageal reflux disease)   . Diverticulosis of colon   . Allergy   . Anemia   . Arthritis     osteo  . Hypertension   . COPD (chronic obstructive pulmonary disease)   . Mitral regurgitation   . Vitamin B deficiency   . Compression fracture of lumbar vertebra 05-2008  . Osteoporosis   . Thrombocytopenia   . SOB (shortness of breath)   . OA (osteoarthritis)   . LBBB (left bundle branch block)   . Hx of colonoscopy   . Warfarin anticoagulation   . Ejection fraction   . TR (tricuspid regurgitation)     moderate, echo 2008  . Dyslipidemia   . Lung abnormality     Dr [name] 122/2011 no further w/u needed  . Prolonged QT interval     when on sotolol in past  . Volume overload     intermittant  . Hypertension   . Weight loss     August, 2012  . Nausea     Some nausea with medications, May, 2013,  . Orthostatic hypotension     June, 2014  . Shingles     Past Surgical History  Procedure Laterality Date  . Thyroidectomy  1962  . Tonsillectomy      Patient Active Problem  List   Diagnosis Date Noted  . Weight loss     Priority: High  . A-fib     Priority: High  . Tachycardia     Priority: High  . CAD (coronary artery disease)     Priority: High  . Mitral regurgitation     Priority: High  . SOB (shortness of breath)     Priority: High  . LBBB (left bundle branch block)     Priority: High  . Warfarin anticoagulation     Priority: High  . Ejection fraction     Priority: High  . TR (tricuspid regurgitation)     Priority: High  . Dyslipidemia     Priority: High  . Prolonged QT interval     Priority: High  . Volume overload     Priority: High  . Right sided sciatica 10/25/2014  . Venous stasis dermatitis 06/19/2014  . Varicose veins of lower extremities with inflammation 03/25/2014  . Cellulitis 03/08/2014  . Chronic diastolic CHF (congestive heart failure) 01/04/2014  . Knee pain 08/27/2013  . Cough 07/03/2013  . Acute bronchitis 07/03/2013  . Orthostatic hypotension   .  Acute on chronic combined systolic and diastolic heart failure 85/10/7739  . Acute renal failure 02/28/2013  . Dehydration 02/28/2013  . Shingles 02/28/2013  . Protein-calorie malnutrition, severe 02/28/2013  . Nail fungus 01/10/2013  . Productive cough 02/29/2012  . Nausea   . Left shoulder pain 10/06/2011  . Lumbar pain 10/06/2011  . Hyperlipidemia 07/09/2011  . Vision blurred 07/09/2011  . Hearing loss 07/09/2011  . Lung abnormality   . Coccydynia 12/03/2010  . Nonspecific (abnormal) findings on radiological and other examination of body structure 07/13/2010  . ABNORMAL CHEST XRAY 07/13/2010  . Shortness of breath 06/18/2010  . HYPERTENSION 06/24/2009  . GOUT, UNSPECIFIED 03/20/2009  . GERD 03/18/2009  . THROMBOCYTOPENIA 01/03/2009  . ALLERGIC RHINITIS 12/05/2008  . HOARSENESS 06/07/2008  . MEDIASTINAL LYMPHADENOPATHY 05/13/2008  . Other Diseases of Lung, not Elsewhere Classified 04/18/2008  . VENOUS INSUFFICIENCY 09/13/2007  . FREQUENCY, URINARY  07/19/2007  . VERTIGO, BENIGN PAROXYSMAL POSITION 05/11/2007  . TINNITUS NOS 05/11/2007  . OSTEOPOROSIS NOS 05/11/2007  . HYPOTHYROIDISM 04/11/2007  . ANEMIA, B12 DEFICIENCY 04/11/2007  . HYPOXEMIA 04/11/2007      Current Outpatient Prescriptions  Medication Sig Dispense Refill  . acetaminophen (TYLENOL ARTHRITIS PAIN) 650 MG CR tablet Take 650 mg by mouth every 8 (eight) hours as needed for pain.     . calcium carbonate (OS-CAL) 600 MG TABS tablet Take 1,200 mg by mouth at bedtime.    . feeding supplement (ENSURE) PUDG Take 1 Container by mouth 3 (three) times daily between meals. 150 g 10  . furosemide (LASIX) 20 MG tablet Take 20 mg by mouth as needed for fluid (Take one tablet by mouth as needed ( for weight of 127 lbs. or greater)).    [name] gabapentin (NEURONTIN) 300 MG capsule Take 1-2 tabs in AM, 1-2 at lunch, and 1 in the eventing 450 capsule 3  . glucosamine-chondroitin 500-400 MG tablet Take 1 tablet by mouth daily.      [name] levothyroxine (SYNTHROID, LEVOTHROID) 88 MCG tablet TAKE 1 TABLET EVERY DAY 90 tablet 0  . Lutein 6 MG CAPS Take 1 capsule by mouth daily.     . metoprolol (LOPRESSOR) 50 MG tablet Take 25 mg by mouth 2 (two) times daily. Patient taking one half tablet by mouth twice daily    . midodrine (PROAMATINE) 5 MG tablet TAKE ONE TABLET BY MOUTH TWICE DAILY WITH A MEAL 180 tablet 0  . Multiple Vitamin (MULTIVITAMIN) tablet Take 1 tablet by mouth daily.     . nitroGLYCERIN (NITROSTAT) 0.4 MG SL tablet Place 1 tablet (0.4 mg total) under the tongue every 5 (five) minutes as needed for chest pain. 25 tablet 0  . pantoprazole (PROTONIX) 40 MG tablet Take 1 tablet (40 mg total) by mouth daily. 90 tablet 3  . potassium chloride (K-DUR) 10 MEQ tablet Take 10 mEq by mouth daily.    . potassium chloride (K-DUR,KLOR-CON) 10 MEQ tablet TAKE 1 TABLET EVERY DAY 90 tablet 0  . simvastatin (ZOCOR) 20 MG tablet Take 0.5 tablets (10 mg total) by mouth every evening. 90 tablet 3  .  warfarin (COUMADIN) 5 MG tablet TAKE 1 TABLET DAILY EXCEPT TAKE 1 AND 1/2 TABLETS ON WEDNESDAY 98 tablet 2   No current facility-administered medications for this visit.    Allergies:   Sulfonamide derivatives and Alendronate sodium    Social History:  The patient  reports that she quit smoking about 67 years ago. She has never used smokeless tobacco. She reports that she  does not drink alcohol or use illicit drugs.   Family History:  The patient's family history includes Cancer in her brother; Diabetes in her brother; Heart disease in her sister; Heart disease (age of onset: 68) in her mother; Leukemia in her sister; Stroke (age of onset: 46) in her father.    ROS:  Please see the history of present illness.     Patient denies fever, chills, headache, sweats, rash, change in vision, change in hearing, chest pain, cough, nausea or vomiting, urinary symptoms. All other systems are reviewed and are negative.   PHYSICAL EXAM: VS:  BP 120/70 mmHg  Pulse 82  Ht 5\' 7"  (1.702 m)  Wt 128 lb (58.06 kg)  BMI 20.04 kg/m2 ,  patient is stable today and here with her daughter. She is oriented to peron time and place. Affect is normal. Head is atraumatic. Sclera and conjunctiva are norml. There is no jugular venous distention. Lungs are clear. Respiraory effort is nonlabored. Cardiac exam reveals S1 and S2. The abdomen is soft. There is no peripheral edema. There are no musculoskeletal deformities. There are no skin rashes.  EKG:    EKG is done today and reviewed by me. There is old atrial fibrillation. The rate is controlled. There is an old interventricular conduction delay of the left bundle-branch block type. There is no significant change.   Recent Labs: 07/08/2014: BUN 20; Creatinine 1.2; Potassium 4.8; Sodium 142    Lipid Panel    Component Value Date/Time   CHOL 133 09/17/2013 0950   TRIG 71.0 09/17/2013 0950   HDL 51.50 09/17/2013 0950   CHOLHDL 3 09/17/2013 0950   VLDL 14.2  09/17/2013 0950   LDLCALC 67 09/17/2013 0950      Wt Readings from Last 3 Encounters:  12/20/14 128 lb (58.06 kg)  10/24/14 130 lb 8 oz (59.194 kg)  08/21/14 122 lb 6.4 oz (55.52 kg)      Current medicines are reviewed   The patient and her daughter understand her medications.     ASSESSMENT AND PLAN:

## 2014-12-20 NOTE — Assessment & Plan Note (Signed)
She is not having any significant shortness of breath at this time. We will continue the same regimen. Her daughter gives her diuretic any day her weight is over certain level. This works well.

## 2014-12-20 NOTE — Patient Instructions (Signed)
Your physician recommends that you continue on your current medications as directed. Please refer to the Current Medication list given to you today.  Your physician wants you to follow-up in: 5 months with Dr. Ron Parker.  You will receive a reminder letter in the mail two months in advance. If you don't receive a letter, please call our office to schedule the follow-up appointment.

## 2014-12-20 NOTE — Assessment & Plan Note (Signed)
Patient continues on her Coumadin for her atrial fib. No change in therapy.

## 2014-12-20 NOTE — Assessment & Plan Note (Signed)
Atrial fib rate is controlled. No change in therapy. 

## 2014-12-23 ENCOUNTER — Telehealth: Payer: Self-pay

## 2014-12-23 NOTE — Telephone Encounter (Signed)
Patient aware of lab results, will call back to schedule follow up.

## 2014-12-23 NOTE — Telephone Encounter (Signed)
-----   Message from Abner Greenspan, MD sent at 12/22/2014  5:30 PM EDT ----- No change in dose, re check in a month please

## 2015-01-17 ENCOUNTER — Other Ambulatory Visit (INDEPENDENT_AMBULATORY_CARE_PROVIDER_SITE_OTHER): Payer: Commercial Managed Care - HMO

## 2015-01-17 DIAGNOSIS — Z5181 Encounter for therapeutic drug level monitoring: Secondary | ICD-10-CM | POA: Diagnosis not present

## 2015-01-17 DIAGNOSIS — Z7901 Long term (current) use of anticoagulants: Secondary | ICD-10-CM

## 2015-01-17 LAB — POCT INR: INR: 1.5

## 2015-01-23 ENCOUNTER — Other Ambulatory Visit (INDEPENDENT_AMBULATORY_CARE_PROVIDER_SITE_OTHER): Payer: Commercial Managed Care - HMO

## 2015-01-23 ENCOUNTER — Other Ambulatory Visit: Payer: Self-pay | Admitting: Family Medicine

## 2015-01-23 DIAGNOSIS — Z7901 Long term (current) use of anticoagulants: Secondary | ICD-10-CM

## 2015-01-23 DIAGNOSIS — I482 Chronic atrial fibrillation, unspecified: Secondary | ICD-10-CM

## 2015-01-23 LAB — POCT INR: INR: 1.7

## 2015-01-24 ENCOUNTER — Telehealth: Payer: Self-pay | Admitting: Family Medicine

## 2015-01-24 NOTE — Telephone Encounter (Signed)
done

## 2015-01-24 NOTE — Telephone Encounter (Signed)
Please call patient's daughter,[name],back on her cell phone with patient's pt/inr results.

## 2015-01-30 ENCOUNTER — Other Ambulatory Visit (INDEPENDENT_AMBULATORY_CARE_PROVIDER_SITE_OTHER): Payer: Commercial Managed Care - HMO

## 2015-01-30 DIAGNOSIS — Z7901 Long term (current) use of anticoagulants: Secondary | ICD-10-CM | POA: Diagnosis not present

## 2015-01-30 DIAGNOSIS — I482 Chronic atrial fibrillation, unspecified: Secondary | ICD-10-CM

## 2015-01-30 LAB — POCT INR: INR: 1.9

## 2015-02-07 ENCOUNTER — Other Ambulatory Visit (INDEPENDENT_AMBULATORY_CARE_PROVIDER_SITE_OTHER): Payer: Commercial Managed Care - HMO

## 2015-02-07 DIAGNOSIS — Z7901 Long term (current) use of anticoagulants: Secondary | ICD-10-CM

## 2015-02-07 DIAGNOSIS — I482 Chronic atrial fibrillation, unspecified: Secondary | ICD-10-CM

## 2015-02-07 LAB — POCT INR: INR: 2.9

## 2015-02-11 ENCOUNTER — Other Ambulatory Visit: Payer: Self-pay | Admitting: *Deleted

## 2015-02-11 MED ORDER — WARFARIN SODIUM 5 MG PO TABS: ORAL_TABLET | ORAL | Status: DC

## 2015-02-11 NOTE — Telephone Encounter (Signed)
Printed to fax  

## 2015-02-11 NOTE — Telephone Encounter (Signed)
See prev note, is it okay to send Rx to Mail order

## 2015-02-11 NOTE — Telephone Encounter (Signed)
Daughter asking for 90day supply sent to [facility] for coumadin

## 2015-02-11 NOTE — Telephone Encounter (Signed)
Rx sent to pharmacy and daughter notified

## 2015-03-04 ENCOUNTER — Other Ambulatory Visit: Payer: Self-pay | Admitting: Cardiology

## 2015-03-13 ENCOUNTER — Other Ambulatory Visit: Payer: Commercial Managed Care - HMO

## 2015-03-13 ENCOUNTER — Encounter: Payer: Self-pay | Admitting: Family Medicine

## 2015-03-13 ENCOUNTER — Ambulatory Visit (INDEPENDENT_AMBULATORY_CARE_PROVIDER_SITE_OTHER): Payer: Commercial Managed Care - HMO | Admitting: *Deleted

## 2015-03-13 ENCOUNTER — Ambulatory Visit (INDEPENDENT_AMBULATORY_CARE_PROVIDER_SITE_OTHER): Payer: Commercial Managed Care - HMO | Admitting: Family Medicine

## 2015-03-13 VITALS — BP 110/64 | HR 76 | Temp 97.7°F | Wt 128.0 lb

## 2015-03-13 DIAGNOSIS — M5431 Sciatica, right side: Secondary | ICD-10-CM

## 2015-03-13 DIAGNOSIS — I482 Chronic atrial fibrillation, unspecified: Secondary | ICD-10-CM

## 2015-03-13 DIAGNOSIS — Z7901 Long term (current) use of anticoagulants: Secondary | ICD-10-CM

## 2015-03-13 LAB — POCT INR: INR: 3

## 2015-03-13 MED ORDER — SIMVASTATIN 5 MG PO TABS: 5.0000 mg | ORAL_TABLET | Freq: Every evening | ORAL | Status: DC

## 2015-03-13 NOTE — Patient Instructions (Addendum)
Take tylenol (650mg  3 times a day )with the gabapentin and then update [place].   I'll talk to Dr. Glori Bickers in the meantime.

## 2015-03-13 NOTE — Progress Notes (Signed)
Pre visit review using our clinic review tool, if applicable. No additional management support is needed unless otherwise documented below in the visit note. 

## 2015-03-13 NOTE — Progress Notes (Signed)
Pre visit review using our clinic review tool, if applicable. No additional management support is needed unless otherwise documented below in the visit note.  She can sit w/o pain.  Pain with standing, better as soon as she sits.  More pain the straighter she stands. PT didn't help, done at [facility].  Already on 1500mg  of gabapentin.  Still with R leg sciatica pain on standing.  Never on the L side.  Hasn't been on tylenol with gabapentin concurrently. On coumadin, so not on nsaids.   Meds, vitals, and allergies reviewed.   ROS: See HPI.  Otherwise, noncontributory.  nad ncat Not in pain if sitting Pain with standing.  S/S wnl for the BLE rrr ctab

## 2015-03-14 ENCOUNTER — Telehealth: Payer: Self-pay

## 2015-03-14 NOTE — Telephone Encounter (Signed)
[name] is at pharmacy to pick up tylenol and wants to know the mg; from 03/13/15 pts instructions advise Tylenol 650 mg tid. [name] did not think pt could take more than one 650 mg tylenol per day. Dr Damita Dunnings said if one Tylenol 650 mg took care of pain per day that was OK but pt can take Tylenol 650 mg three times a day (8 hours apart) if needed. [name] voiced understanding.

## 2015-03-14 NOTE — Telephone Encounter (Signed)
Thanks

## 2015-03-14 NOTE — Assessment & Plan Note (Signed)
She could have a component of spinal stenosis also.  D/w pt about options.  Would be reasonable to try tylenol with gabapentin concurrently.  If not better, she may need referral to spine clinic/PMR for consideration of extra imaging and injection.  That would involve consideration of coumadin.  D/w pt and daughter.  Routed to PCP as FYI.  Still okay for outpatient f/u.  No emergent sx.  >25 minutes spent in face to face time with patient, >50% spent in counselling or coordination of care.

## 2015-04-02 ENCOUNTER — Telehealth: Payer: Self-pay | Admitting: Cardiology

## 2015-04-02 ENCOUNTER — Other Ambulatory Visit: Payer: Self-pay | Admitting: Family Medicine

## 2015-04-02 ENCOUNTER — Other Ambulatory Visit: Payer: Self-pay | Admitting: Cardiology

## 2015-04-02 ENCOUNTER — Other Ambulatory Visit: Payer: Self-pay

## 2015-04-02 MED ORDER — FUROSEMIDE 20 MG PO TABS: ORAL_TABLET | ORAL | Status: DC

## 2015-04-02 NOTE — Telephone Encounter (Signed)
Please schedule a f/u in the fall and refill until then Thanks

## 2015-04-02 NOTE — Telephone Encounter (Addendum)
**Note De-Identified  Obfuscation** [name], the pts daughter, states that the pts weight has increased since her last OV with Dr Ron Parker on 12/20/14 when the pts office weight was 128 lbs. She states that she noticed on July 8 that the pts weight was at 134 lbs. and that she has been giving her a dose of Lasix 20 mg daily since then and on some days she has even given her two Lasix 20 mg tablets. She reports that the pt has no SOB and only minor edema in her legs per the pt but [name] states that she does not see any swelling at this time. [name] asked if the pt could be gaining true body weight since the extra doses of Lasix has no effect on her weight and that the pt weighs 133 lbs this am. I asked for the pts past weights but [name] states that her weight list is at the pts home and since she is calling from her own home today she does not have the pts  weight list.  [name] is advised to give the pt a dose of Lasix today at about 2:00 or 3:00 and to call me back either later today or tomorrow with the pts weight list so I can send information to Dr Ron Parker for his recommendation.  [name] verbalized understanding and agrees with plan.

## 2015-04-02 NOTE — Telephone Encounter (Signed)
New message    Pt daughter states that pt seems to be gaining weight and holding fluid.   Pt daughter states pt is taking 2 pills a day of lasix and can't see that weight is coming Pt is weighing around 134 since July 8th Please call to discuss

## 2015-04-02 NOTE — Telephone Encounter (Signed)
Follow up    Pt daughter has the weights for the patient that you need. Please call to discuss

## 2015-04-02 NOTE — Telephone Encounter (Signed)
**Note De-Identified  Obfuscation** [name] called with list of pts weights. 03/14/15= 131 lbs.She took  20 mg of Lasix 03/15/15= 132 lbs. She took 20 mg of Lasix 03/16/15= 132 lbs. She took 20 mg of Lasix 03/17/15= 133 lbs. She took 20 mg of Lasix 03/18/15= 133 lbs. She took 20 mg of Lasix 03/19/15= 133 lbs. She took 20 mg of Lasix 03/20/15= 133 lbs. She took 20 mg of Lasix 03/21/15= 134 lbs. She took 40 mg of Lasix 03/22/15= 133 lbs. She took 40 mg of Lasix 03/23/15= 133 lbs. She took 20 mg of Lasix 03/24/15= 132 lbs. She took 20 mg of Lasix 03/25/15= 133 lbs. She took 20 mg of Lasix 03/26/15= 132 lbs. She took 20 mg of Lasix 03/27/15= 132 lbs. She took 20 mg of Lasix 03/28/15= 133 lbs. She took 40 mg of Lasix 03/29/15= 133 lbs. She took 20 mg of Lasix 03/30/15= 133 lbs. She took 40 mg of Lasix 03/31/15= 134 lbs. She took 40 mg of Lasix 04/01/15= 133 lbs. She took 40 mg of Lasix 04/02/15= 133 lbs. She took 40 mg of Lasix Her weight at last OV with Dr Ron Parker on 12/20/14 was 128 lbs.  [name] states that when she spoke with her mother today she told her that she does become SOB while walking for exercise in the hallway and she c/o weakness. She is advised that I am sending this message to Dr Ron Parker for his recommendations and that we will get back in touch with her. She verbalized understanding.

## 2015-04-02 NOTE — Telephone Encounter (Signed)
Electronic refill request, pt has kept up with her coumadin appts but no recent/future f/u or CPE appts with you, please advise

## 2015-04-03 NOTE — Telephone Encounter (Signed)
appt scheduled and med refilled 

## 2015-04-04 ENCOUNTER — Other Ambulatory Visit: Payer: Self-pay

## 2015-04-04 MED ORDER — FUROSEMIDE 20 MG PO TABS: ORAL_TABLET | ORAL | Status: DC

## 2015-04-04 NOTE — Telephone Encounter (Signed)
The pts daughter, [name], called to report the pts weight for yesterday am and this am. Yesterday 04/03/15 weight= 132 lbs (SOB with exertion and mild to no edema in legs per [name]) This am weight=132 lbs. (SOB with exertion and mild to no edema in legs per [name])  Since there has been very little variation in the pts weights since 03/14/15 [name] is asked to advise the pt not to take any more doses of Lasix until she hears back from [place] as she may have gained body weight and not weight from fluid.  [name] is advised that I will forward this message to Dr Ron Parker and will contact her with his recommendations.

## 2015-04-04 NOTE — Telephone Encounter (Signed)
Follow up     Daughter calling regarding lasix.

## 2015-04-07 ENCOUNTER — Inpatient Hospital Stay
Admission: EM | Admit: 2015-04-07 | Discharge: 2015-04-12 | DRG: 302 | Disposition: A | Discharge status: Skilled Nursing Facility | Payer: Commercial Managed Care - HMO | Attending: Internal Medicine | Admitting: Internal Medicine

## 2015-04-07 ENCOUNTER — Emergency Department: Payer: Commercial Managed Care - HMO

## 2015-04-07 ENCOUNTER — Other Ambulatory Visit: Payer: Self-pay

## 2015-04-07 ENCOUNTER — Encounter: Payer: Self-pay | Admitting: Medical Oncology

## 2015-04-07 ENCOUNTER — Telehealth: Payer: Self-pay | Admitting: Cardiology

## 2015-04-07 ENCOUNTER — Telehealth: Payer: Self-pay | Admitting: Family Medicine

## 2015-04-07 ENCOUNTER — Inpatient Hospital Stay (HOSPITAL_COMMUNITY)
Admit: 2015-04-07 | Discharge: 2015-04-07 | Disposition: A | Discharge status: Home / Self Care | Payer: Commercial Managed Care - HMO | Attending: Internal Medicine | Admitting: Internal Medicine

## 2015-04-07 DIAGNOSIS — R627 Adult failure to thrive: Secondary | ICD-10-CM | POA: Diagnosis present

## 2015-04-07 DIAGNOSIS — I959 Hypotension, unspecified: Secondary | ICD-10-CM | POA: Diagnosis present

## 2015-04-07 DIAGNOSIS — Z87891 Personal history of nicotine dependence: Secondary | ICD-10-CM

## 2015-04-07 DIAGNOSIS — I5021 Acute systolic (congestive) heart failure: Secondary | ICD-10-CM | POA: Insufficient documentation

## 2015-04-07 DIAGNOSIS — I200 Unstable angina: Secondary | ICD-10-CM | POA: Diagnosis not present

## 2015-04-07 DIAGNOSIS — K920 Hematemesis: Secondary | ICD-10-CM | POA: Diagnosis present

## 2015-04-07 DIAGNOSIS — I130 Hypertensive heart and chronic kidney disease with heart failure and stage 1 through stage 4 chronic kidney disease, or unspecified chronic kidney disease: Secondary | ICD-10-CM | POA: Diagnosis present

## 2015-04-07 DIAGNOSIS — I25110 Atherosclerotic heart disease of native coronary artery with unstable angina pectoris: Secondary | ICD-10-CM | POA: Diagnosis present

## 2015-04-07 DIAGNOSIS — D696 Thrombocytopenia, unspecified: Secondary | ICD-10-CM | POA: Diagnosis present

## 2015-04-07 DIAGNOSIS — I447 Left bundle-branch block, unspecified: Secondary | ICD-10-CM | POA: Diagnosis present

## 2015-04-07 DIAGNOSIS — Z888 Allergy status to other drugs, medicaments and biological substances status: Secondary | ICD-10-CM

## 2015-04-07 DIAGNOSIS — J449 Chronic obstructive pulmonary disease, unspecified: Secondary | ICD-10-CM | POA: Diagnosis present

## 2015-04-07 DIAGNOSIS — I272 Other secondary pulmonary hypertension: Secondary | ICD-10-CM | POA: Diagnosis present

## 2015-04-07 DIAGNOSIS — I270 Primary pulmonary hypertension: Secondary | ICD-10-CM | POA: Diagnosis not present

## 2015-04-07 DIAGNOSIS — I340 Nonrheumatic mitral (valve) insufficiency: Secondary | ICD-10-CM

## 2015-04-07 DIAGNOSIS — R233 Spontaneous ecchymoses: Secondary | ICD-10-CM | POA: Diagnosis present

## 2015-04-07 DIAGNOSIS — S01311A Laceration without foreign body of right ear, initial encounter: Secondary | ICD-10-CM | POA: Diagnosis present

## 2015-04-07 DIAGNOSIS — I10 Essential (primary) hypertension: Secondary | ICD-10-CM | POA: Diagnosis present

## 2015-04-07 DIAGNOSIS — M810 Age-related osteoporosis without current pathological fracture: Secondary | ICD-10-CM | POA: Diagnosis present

## 2015-04-07 DIAGNOSIS — D649 Anemia, unspecified: Secondary | ICD-10-CM | POA: Diagnosis present

## 2015-04-07 DIAGNOSIS — E46 Unspecified protein-calorie malnutrition: Secondary | ICD-10-CM | POA: Diagnosis present

## 2015-04-07 DIAGNOSIS — Z66 Do not resuscitate: Secondary | ICD-10-CM | POA: Diagnosis present

## 2015-04-07 DIAGNOSIS — R079 Chest pain, unspecified: Secondary | ICD-10-CM | POA: Diagnosis present

## 2015-04-07 DIAGNOSIS — I482 Chronic atrial fibrillation, unspecified: Secondary | ICD-10-CM | POA: Insufficient documentation

## 2015-04-07 DIAGNOSIS — X58XXXA Exposure to other specified factors, initial encounter: Secondary | ICD-10-CM | POA: Diagnosis present

## 2015-04-07 DIAGNOSIS — Z79899 Other long term (current) drug therapy: Secondary | ICD-10-CM

## 2015-04-07 DIAGNOSIS — I952 Hypotension due to drugs: Secondary | ICD-10-CM | POA: Diagnosis not present

## 2015-04-07 DIAGNOSIS — E785 Hyperlipidemia, unspecified: Secondary | ICD-10-CM | POA: Diagnosis present

## 2015-04-07 DIAGNOSIS — I5043 Acute on chronic combined systolic (congestive) and diastolic (congestive) heart failure: Secondary | ICD-10-CM | POA: Diagnosis present

## 2015-04-07 DIAGNOSIS — K219 Gastro-esophageal reflux disease without esophagitis: Secondary | ICD-10-CM | POA: Diagnosis present

## 2015-04-07 DIAGNOSIS — E039 Hypothyroidism, unspecified: Secondary | ICD-10-CM | POA: Diagnosis present

## 2015-04-07 DIAGNOSIS — Z882 Allergy status to sulfonamides status: Secondary | ICD-10-CM | POA: Diagnosis not present

## 2015-04-07 DIAGNOSIS — I872 Venous insufficiency (chronic) (peripheral): Secondary | ICD-10-CM | POA: Diagnosis present

## 2015-04-07 DIAGNOSIS — I255 Ischemic cardiomyopathy: Secondary | ICD-10-CM | POA: Insufficient documentation

## 2015-04-07 DIAGNOSIS — Z7901 Long term (current) use of anticoagulants: Secondary | ICD-10-CM

## 2015-04-07 HISTORY — DX: Chronic atrial fibrillation, unspecified: I48.20

## 2015-04-07 LAB — BRAIN NATRIURETIC PEPTIDE: B Natriuretic Peptide: 779 pg/mL — ABNORMAL HIGH (ref 0.0–100.0)

## 2015-04-07 LAB — CBC WITH DIFFERENTIAL/PLATELET
Basophils Absolute: 0.1 10*3/uL (ref 0–0.1)
Basophils Relative: 1 %
Eosinophils Absolute: 0.1 10*3/uL (ref 0–0.7)
HCT: 39.5 % (ref 35.0–47.0)
Hemoglobin: 12.7 g/dL (ref 12.0–16.0)
LYMPHS ABS: 0.7 10*3/uL — AB (ref 1.0–3.6)
MCH: 31.3 pg (ref 26.0–34.0)
MCHC: 32.1 g/dL (ref 32.0–36.0)
MCV: 97.7 fL (ref 80.0–100.0)
Monocytes Absolute: 0.9 10*3/uL (ref 0.2–0.9)
NEUTROS ABS: 6.7 10*3/uL — AB (ref 1.4–6.5)
Neutrophils Relative %: 78 %
Platelets: 93 10*3/uL — ABNORMAL LOW (ref 150–440)
RBC: 4.04 MIL/uL (ref 3.80–5.20)
RDW: 16.7 % — ABNORMAL HIGH (ref 11.5–14.5)
WBC: 8.5 10*3/uL (ref 3.6–11.0)

## 2015-04-07 LAB — BASIC METABOLIC PANEL
Anion gap: 8 (ref 5–15)
BUN: 28 mg/dL — ABNORMAL HIGH (ref 6–20)
CHLORIDE: 105 mmol/L (ref 101–111)
CO2: 27 mmol/L (ref 22–32)
Calcium: 9.3 mg/dL (ref 8.9–10.3)
Creatinine, Ser: 1.5 mg/dL — ABNORMAL HIGH (ref 0.44–1.00)
GFR calc Af Amer: 34 mL/min — ABNORMAL LOW (ref >60)
GFR, EST NON AFRICAN AMERICAN: 29 mL/min — AB (ref >60)
Glucose, Bld: 114 mg/dL — ABNORMAL HIGH (ref 65–99)
Potassium: 4.3 mmol/L (ref 3.5–5.1)
Sodium: 140 mmol/L (ref 135–145)

## 2015-04-07 LAB — TROPONIN I
Troponin I: 0.03 ng/mL (ref <0.031)
Troponin I: 0.03 ng/mL (ref <0.031)
Troponin I: 0.03 ng/mL (ref <0.031)

## 2015-04-07 LAB — PROTIME-INR
INR: 2.89
PROTHROMBIN TIME: 30.3 s — AB (ref 11.4–15.0)

## 2015-04-07 MED ORDER — GABAPENTIN 300 MG PO CAPS: 300.0000 mg | ORAL_CAPSULE | Freq: Three times a day (TID) | ORAL | Status: DC

## 2015-04-07 MED ORDER — BISACODYL 5 MG PO TBEC: 5.0000 mg | DELAYED_RELEASE_TABLET | Freq: Every day | ORAL | Status: DC | PRN

## 2015-04-07 MED ORDER — SODIUM CHLORIDE 0.9 % IJ SOLN
3.0000 mL | Freq: Two times a day (BID) | INTRAMUSCULAR | Status: DC
  Administered 2015-04-07 – 2015-04-10 (×6): 3 mL via INTRAVENOUS

## 2015-04-07 MED ORDER — CALCIUM CARBONATE 600 MG PO TABS
1200.0000 mg | ORAL_TABLET | Freq: Every day | ORAL | Status: DC
  Filled 2015-04-07: qty 2

## 2015-04-07 MED ORDER — SENNOSIDES-DOCUSATE SODIUM 8.6-50 MG PO TABS
1.0000 | ORAL_TABLET | Freq: Every day | ORAL | Status: DC
  Administered 2015-04-07 – 2015-04-11 (×5): 1 via ORAL
  Filled 2015-04-07 (×5): qty 1

## 2015-04-07 MED ORDER — METOPROLOL TARTRATE 25 MG PO TABS
25.0000 mg | ORAL_TABLET | Freq: Two times a day (BID) | ORAL | Status: DC
  Administered 2015-04-08 – 2015-04-09 (×2): 25 mg via ORAL
  Filled 2015-04-07 (×6): qty 1

## 2015-04-07 MED ORDER — FUROSEMIDE 10 MG/ML IJ SOLN
20.0000 mg | Freq: Every day | INTRAMUSCULAR | Status: DC
  Administered 2015-04-07: 20 mg via INTRAVENOUS
  Filled 2015-04-07: qty 2

## 2015-04-07 MED ORDER — POTASSIUM CHLORIDE ER 10 MEQ PO TBCR
10.0000 meq | EXTENDED_RELEASE_TABLET | Freq: Every day | ORAL | Status: DC
  Administered 2015-04-07 – 2015-04-12 (×6): 10 meq via ORAL
  Filled 2015-04-07 (×12): qty 1

## 2015-04-07 MED ORDER — PANTOPRAZOLE SODIUM 40 MG IV SOLR
40.0000 mg | Freq: Two times a day (BID) | INTRAVENOUS | Status: DC
  Administered 2015-04-07 – 2015-04-09 (×5): 40 mg via INTRAVENOUS
  Filled 2015-04-07 (×5): qty 40

## 2015-04-07 MED ORDER — TRAZODONE HCL 50 MG PO TABS: 25.0000 mg | ORAL_TABLET | Freq: Every evening | ORAL | Status: DC | PRN

## 2015-04-07 MED ORDER — FERROUS SULFATE 325 (65 FE) MG PO TABS
325.0000 mg | ORAL_TABLET | Freq: Every day | ORAL | Status: DC
  Administered 2015-04-07 – 2015-04-12 (×6): 325 mg via ORAL
  Filled 2015-04-07 (×6): qty 1

## 2015-04-07 MED ORDER — GABAPENTIN 300 MG PO CAPS
600.0000 mg | ORAL_CAPSULE | Freq: Two times a day (BID) | ORAL | Status: DC
  Administered 2015-04-08 – 2015-04-10 (×5): 600 mg via ORAL
  Filled 2015-04-07 (×5): qty 2

## 2015-04-07 MED ORDER — CALCIUM CARBONATE ANTACID 500 MG PO CHEW
400.0000 mg | CHEWABLE_TABLET | Freq: Every day | ORAL | Status: DC
  Administered 2015-04-07 – 2015-04-11 (×5): 400 mg via ORAL
  Filled 2015-04-07 (×5): qty 2

## 2015-04-07 MED ORDER — ACETAMINOPHEN 650 MG RE SUPP: 650.0000 mg | Freq: Four times a day (QID) | RECTAL | Status: DC | PRN

## 2015-04-07 MED ORDER — ONDANSETRON HCL 4 MG/2ML IJ SOLN: 4.0000 mg | Freq: Four times a day (QID) | INTRAMUSCULAR | Status: DC | PRN

## 2015-04-07 MED ORDER — SIMVASTATIN 10 MG PO TABS
5.0000 mg | ORAL_TABLET | Freq: Every evening | ORAL | Status: DC
  Administered 2015-04-07 – 2015-04-11 (×5): 5 mg via ORAL
  Filled 2015-04-07 (×5): qty 1

## 2015-04-07 MED ORDER — LEVOTHYROXINE SODIUM 88 MCG PO TABS
88.0000 ug | ORAL_TABLET | Freq: Every day | ORAL | Status: DC
  Administered 2015-04-08 – 2015-04-12 (×5): 88 ug via ORAL
  Filled 2015-04-07 (×5): qty 1

## 2015-04-07 MED ORDER — ALUM & MAG HYDROXIDE-SIMETH 200-200-20 MG/5ML PO SUSP: 30.0000 mL | Freq: Four times a day (QID) | ORAL | Status: DC | PRN

## 2015-04-07 MED ORDER — FUROSEMIDE 10 MG/ML IJ SOLN
40.0000 mg | Freq: Once | INTRAMUSCULAR | Status: AC
  Administered 2015-04-07: 40 mg via INTRAVENOUS
  Filled 2015-04-07: qty 4

## 2015-04-07 MED ORDER — SODIUM CHLORIDE 0.9 % IJ SOLN: 3.0000 mL | INTRAMUSCULAR | Status: DC | PRN

## 2015-04-07 MED ORDER — SODIUM CHLORIDE 0.9 % IJ SOLN
3.0000 mL | Freq: Two times a day (BID) | INTRAMUSCULAR | Status: DC
  Administered 2015-04-07 – 2015-04-09 (×4): 3 mL via INTRAVENOUS

## 2015-04-07 MED ORDER — ACETAMINOPHEN 325 MG PO TABS
650.0000 mg | ORAL_TABLET | Freq: Four times a day (QID) | ORAL | Status: DC | PRN
  Administered 2015-04-08 – 2015-04-11 (×3): 650 mg via ORAL
  Filled 2015-04-07 (×2): qty 2

## 2015-04-07 MED ORDER — SODIUM CHLORIDE 0.9 % IV SOLN: 250.0000 mL | INTRAVENOUS | Status: DC | PRN

## 2015-04-07 MED ORDER — HYDROCODONE-ACETAMINOPHEN 5-325 MG PO TABS: 1.0000 | ORAL_TABLET | ORAL | Status: DC | PRN

## 2015-04-07 MED ORDER — GABAPENTIN 300 MG PO CAPS
300.0000 mg | ORAL_CAPSULE | Freq: Every day | ORAL | Status: DC
  Administered 2015-04-07 – 2015-04-09 (×3): 300 mg via ORAL
  Filled 2015-04-07 (×3): qty 1

## 2015-04-07 MED ORDER — PANTOPRAZOLE SODIUM 40 MG PO TBEC: 40.0000 mg | DELAYED_RELEASE_TABLET | Freq: Every day | ORAL | Status: DC

## 2015-04-07 MED ORDER — FUROSEMIDE 20 MG PO TABS: ORAL_TABLET | ORAL | Status: DC

## 2015-04-07 MED ORDER — ONDANSETRON HCL 4 MG PO TABS: 4.0000 mg | ORAL_TABLET | Freq: Four times a day (QID) | ORAL | Status: DC | PRN

## 2015-04-07 MED ORDER — NITROGLYCERIN 0.4 MG SL SUBL: 0.4000 mg | SUBLINGUAL_TABLET | SUBLINGUAL | Status: DC | PRN

## 2015-04-07 NOTE — Telephone Encounter (Signed)
Pt is at [facility] with fluid on her lungs.  She went by ambulance to the ed.  Please call cell phone if needed, daughrt wanted you  o be able to see the records.

## 2015-04-07 NOTE — ED Notes (Signed)
Pt began having chest pain last  Night, pain worsens with deep breathing. Pt also reports that she has vomited x 2 this am with some blood in vomit. Pt reports after vomiting she noted "rash" to her face.

## 2015-04-07 NOTE — Telephone Encounter (Signed)
**Note De-Identified  Obfuscation** [name], the pts daughter, states that the pt has been admitted into [facility] for CP, neck pain, vomiting and fluid on her lungs. They called 911 and the pt was taken to [facility]. She states that the MD's have mentioned that they are going to give the pt IV lasix and possibly send her home later today. [name] states that she is uncomfortable with the pt coming home tonight and wants to know if Dr Ron Parker thinks that the pt should be transferred to [facility] hospital. She states she would feel better if Dr Ron Parker could advise her concerning the care of her mother. Please advise.

## 2015-04-07 NOTE — Telephone Encounter (Signed)
New message     Pt daughter states pt has been admitted to [facility] for fluid in lungs. Pt daughter states pt is hurting in chest and neck Daughter wants to know if Dr. Ron Parker thinks pt should be moved to [facility]. Please call to discuss

## 2015-04-07 NOTE — ED Provider Notes (Signed)
[facility] Emergency Department Provider Note   ____________________________________________  Time seen: On EMS arrival  I have reviewed the triage vital signs and the nursing notes.   HISTORY  Chief Complaint Chest Pain; Shortness of Breath; and Emesis   History limited by: Not Limited   HPI [name] is a 81 y.o. female Who presents to the emergency department today because chest pain. She describes it as a burning pain. It is located in the central chest. It does go up into her throat.it is slightly worse with deep breaths. This morning the patient had 2 episodes of vomiting with this. She noticed a rash on her face after the vomiting. Patient is on warfarin. The patient was given aspirin and nitroglycerin by EMS.     Past Medical History  Diagnosis Date  . A-fib     chronic  /  holter 06/2010, no brady, mild tachy  . Tachycardia     holter, 06/2010, no brady, mild  tachy  . CAD (coronary artery disease)     2 DES, [facility], 2004  . Pleural effusion associated with pulmonary infection 03-2007  . Hypothyroid   . Hyperlipidemia   . GERD (gastroesophageal reflux disease)   . Diverticulosis of colon   . Allergy   . Anemia   . Arthritis     osteo  . Hypertension   . COPD (chronic obstructive pulmonary disease)   . Mitral regurgitation   . Vitamin B deficiency   . Compression fracture of lumbar vertebra 05-2008  . Osteoporosis   . Thrombocytopenia   . SOB (shortness of breath)   . OA (osteoarthritis)   . LBBB (left bundle branch block)   . Hx of colonoscopy   . Warfarin anticoagulation   . Ejection fraction   . TR (tricuspid regurgitation)     moderate, echo 2008  . Dyslipidemia   . Lung abnormality     Dr [name] 122/2011 no further w/u needed  . Prolonged QT interval     when on sotolol in past  . Volume overload     intermittant  . Hypertension   . Weight loss     August, 2012  . Nausea     Some nausea with medications, May,  2013,  . Orthostatic hypotension     June, 2014  . Shingles     Patient Active Problem List   Diagnosis Date Noted  . Right sided sciatica 10/25/2014  . Venous stasis dermatitis 06/19/2014  . Varicose veins of lower extremities with inflammation 03/25/2014  . Cellulitis 03/08/2014  . Chronic diastolic CHF (congestive heart failure) 01/04/2014  . Knee pain 08/27/2013  . Cough 07/03/2013  . Acute bronchitis 07/03/2013  . Orthostatic hypotension   . Acute on chronic combined systolic and diastolic heart failure 63/78/5885  . Acute renal failure 02/28/2013  . Dehydration 02/28/2013  . Shingles 02/28/2013  . Protein-calorie malnutrition, severe 02/28/2013  . Nail fungus 01/10/2013  . Productive cough 02/29/2012  . Nausea   . Left shoulder pain 10/06/2011  . Lumbar pain 10/06/2011  . Hyperlipidemia 07/09/2011  . Vision blurred 07/09/2011  . Hearing loss 07/09/2011  . Weight loss   . A-fib   . Tachycardia   . CAD (coronary artery disease)   . Mitral regurgitation   . SOB (shortness of breath)   . LBBB (left bundle branch block)   . Warfarin anticoagulation   . Ejection fraction   . TR (tricuspid regurgitation)   . Dyslipidemia   .  Lung abnormality   . Prolonged QT interval   . Volume overload   . Coccydynia 12/03/2010  . Nonspecific (abnormal) findings on radiological and other examination of body structure 07/13/2010  . ABNORMAL CHEST XRAY 07/13/2010  . Shortness of breath 06/18/2010  . HYPERTENSION 06/24/2009  . GOUT, UNSPECIFIED 03/20/2009  . GERD 03/18/2009  . THROMBOCYTOPENIA 01/03/2009  . ALLERGIC RHINITIS 12/05/2008  . HOARSENESS 06/07/2008  . MEDIASTINAL LYMPHADENOPATHY 05/13/2008  . Other diseases of lung, not elsewhere classified 04/18/2008  . VENOUS INSUFFICIENCY 09/13/2007  . FREQUENCY, URINARY 07/19/2007  . VERTIGO, BENIGN PAROXYSMAL POSITION 05/11/2007  . TINNITUS NOS 05/11/2007  . OSTEOPOROSIS NOS 05/11/2007  . HYPOTHYROIDISM 04/11/2007  . ANEMIA,  B12 DEFICIENCY 04/11/2007  . HYPOXEMIA 04/11/2007    Past Surgical History  Procedure Laterality Date  . Thyroidectomy  1962  . Tonsillectomy      Current Outpatient Rx  Name  Route  Sig  Dispense  Refill  . acetaminophen (TYLENOL ARTHRITIS PAIN) 650 MG CR tablet   Oral   Take 650 mg by mouth every 8 (eight) hours as needed for pain.          . calcium carbonate (OS-CAL) 600 MG TABS tablet   Oral   Take 1,200 mg by mouth at bedtime.         . feeding supplement (ENSURE) PUDG   Oral   Take 1 Container by mouth 3 (three) times daily between meals.   150 g   10   . furosemide (LASIX) 20 MG tablet      Take 1 tablet daily as needed for increased weight, edema and SOB   90 tablet   0   . gabapentin (NEURONTIN) 300 MG capsule      Take 1-2 tabs in AM, 1-2 at lunch, and 1 in the eventing   450 capsule   3   . glucosamine-chondroitin 500-400 MG tablet   Oral   Take 1 tablet by mouth daily.           [name] levothyroxine (SYNTHROID, LEVOTHROID) 88 MCG tablet      TAKE 1 TABLET EVERY DAY   90 tablet   0   . Lutein 6 MG CAPS   Oral   Take 1 capsule by mouth daily.          . metoprolol (LOPRESSOR) 50 MG tablet      TAKE 1/2 TABLET TWICE DAILY   90 tablet   0   . midodrine (PROAMATINE) 5 MG tablet      TAKE ONE TABLET BY MOUTH TWICE DAILY WITH A MEAL   180 tablet   0     FOR NEXT FILL. THANK YOU   . Multiple Vitamin (MULTIVITAMIN) tablet   Oral   Take 1 tablet by mouth daily.          . nitroGLYCERIN (NITROSTAT) 0.4 MG SL tablet   Sublingual   Place 1 tablet (0.4 mg total) under the tongue every 5 (five) minutes as needed for chest pain.   25 tablet   0   . pantoprazole (PROTONIX) 40 MG tablet   Oral   Take 1 tablet (40 mg total) by mouth daily.   90 tablet   3   . potassium chloride (K-DUR) 10 MEQ tablet   Oral   Take 10 mEq by mouth daily.         . potassium chloride (K-DUR,KLOR-CON) 10 MEQ tablet      TAKE 1 TABLET EVERY DAY  90  tablet   0   . simvastatin (ZOCOR) 5 MG tablet   Oral   Take 1 tablet (5 mg total) by mouth every evening.         . warfarin (COUMADIN) 5 MG tablet      Take 1 1/2 pills daily or as directed   135 tablet   2     Allergies Sulfonamide derivatives and Alendronate sodium  Family History  Problem Relation Age of Onset  . Heart disease Mother 33  . Stroke Father 65  . Heart disease Sister   . Cancer Brother     colon  . Diabetes Brother   . Leukemia Sister     Social History History  Substance Use Topics  . Smoking status: Former Smoker    Quit date: 09/14/1947  . Smokeless tobacco: Never Used  . Alcohol Use: No    Review of Systems  Constitutional: Negative for fever. Cardiovascular: positive for chest pain. Respiratory: Negative for shortness of breath. Gastrointestinal: Negative for abdominal pain, vomiting and diarrhea. Genitourinary: Negative for dysuria. Musculoskeletal: Negative for back pain. Skin: positive for facial rash Neurological: Negative for headaches, focal weakness or numbness.   10-point ROS otherwise negative.  ____________________________________________   PHYSICAL EXAM:  VITAL SIGNS: ED Triage Vitals  Enc Vitals Group     BP 04/07/15 0855 143/90 mmHg     Pulse Rate 04/07/15 0855 88     Resp 04/07/15 0855 22     Temp 04/07/15 0855 97.5 F (36.4 C)     Temp Source 04/07/15 0855 Oral     SpO2 04/07/15 0855 100 %     Weight 04/07/15 0855 141 lb (63.957 kg)     Height 04/07/15 0855 5\' 5"  (1.651 m)     Head Cir --      Peak Flow --      Pain Score 04/07/15 0859 8   Constitutional: Alert and oriented. Well appearing and in no distress. Eyes: Conjunctivae are normal. PERRL. Normal extraocular movements. ENT   Head: Normocephalic and atraumatic.patient does have a petechial rash over her face.   Nose: No congestion/rhinnorhea.   Mouth/Throat: Mucous membranes are moist.   Neck: No  stridor. Hematological/Lymphatic/Immunilogical: No cervical lymphadenopathy. Cardiovascular: irregularly irregular rhythm.   Respiratory: Normal respiratory effort without tachypnea nor retractions.  Genitourinary: Deferred Musculoskeletal: Normal range of motion in all extremities. No joint effusions.  No lower extremity tenderness nor edema. Neurologic:  Normal speech and language. No gross focal neurologic deficits are appreciated. Speech is normal.  Skin:  Skin is warm, dry and intact. No rash noted. Psychiatric: Mood and affect are normal. Speech and behavior are normal. Patient exhibits appropriate insight and judgment.  ____________________________________________    LABS (pertinent positives/negatives)  Labs Reviewed  PROTIME-INR - Abnormal; Notable for the following:    Prothrombin Time 30.3 (*)    All other components within normal limits  CBC WITH DIFFERENTIAL/PLATELET - Abnormal; Notable for the following:    RDW 16.7 (*)    Platelets 93 (*)    Neutro Abs 6.7 (*)    Lymphs Abs 0.7 (*)    All other components within normal limits  BASIC METABOLIC PANEL - Abnormal; Notable for the following:    Glucose, Bld 114 (*)    BUN 28 (*)    Creatinine, Ser 1.50 (*)    GFR calc non Af Amer 29 (*)    GFR calc Af Amer 34 (*)    All other components within  normal limits  BRAIN NATRIURETIC PEPTIDE - Abnormal; Notable for the following:    B Natriuretic Peptide 779.0 (*)    All other components within normal limits  TROPONIN I  TROPONIN I   ____________________________________________   EKG  I, Nance Pear, attending physician, personally viewed and interpreted this EKG  EKG Time: 0853 Rate: 80 Rhythm: atrial fibrillation Axis: left axis deviation Intervals: qtc 461 QRS: LBBB ST changes: no sgarbossa criteria    ____________________________________________    RADIOLOGY  IMPRESSION: Chronic changes without acute  abnormality. ____________________________________________   PROCEDURES  Procedure(s) performed: None  Critical Care performed: No  ____________________________________________   INITIAL IMPRESSION / ASSESSMENT AND PLAN / ED COURSE  Pertinent labs & imaging results that were available during my care of the patient were reviewed by me and considered in my medical decision making (see chart for details).  Patient presented to the emergency department after burning type pain and vomiting. On exam patient has some petechial type rash over her face. I think likely this is secondary to forceful vomiting and being on warfarin. The patient's chest x-ray without any concerning findings. 2 sets of troponin negative here. I did discuss with Dr. Fletcher Anon who thought the patient would benefit from inpatient admission and observation with cycling of enzymes and possible echocardiogram in the morning.  ____________________________________________   FINAL CLINICAL IMPRESSION(S) / ED DIAGNOSES  Final diagnoses:  Chest pain, unspecified chest pain type     Nance Pear, MD 04/07/15 1609

## 2015-04-07 NOTE — ED Notes (Signed)
Report received from Kim. G, RN. 

## 2015-04-07 NOTE — Telephone Encounter (Signed)
[name] is advised, Per Dr Ron Parker, to talk with the pts nurse and request input for her mothers cardiac care from either Dr Rockey Situ or Dr Fletcher Anon who are a part of the [facility] team. She wrote down the names of Dr Fletcher Anon and Dr Rockey Situ and states she will discuss with her moms nurse.

## 2015-04-07 NOTE — H&P (Addendum)
[facility] [place] NAME: [name]    MR#:  [id number]  DATE OF BIRTH:  1922/01/31  DATE OF ADMISSION:  04/07/2015  PRIMARY CARE PHYSICIAN: Loura Pardon, MD   REQUESTING/REFERRING PHYSICIAN: Dr Archie Balboa  CHIEF COMPLAINT:   Chief Complaint  Patient presents with  . Chest Pain  . Shortness of Breath  . Emesis   HISTORY OF PRESENT ILLNESS:  [name]  is a 81 y.o. female with a known history of coronary artery disease, atrial fibrillation on Coumadin is being admitted for unstable angina and possible heart failure.  Patient has been having fluctuating weight since last Wednesday and tried oral Lasix, which did not help much.  She lives at [facility].  He was not feeling well last night with complaint of chest pain radiating to the neck and shoulder blade.  This morning around 6 AM she was feeling sick and threw up twice with some blood in it.  Immediately after that.  EMS was called, who noted that she had new onset petechiae on her face all over and she was brought down to the emergency department.  While in the ED, she underwent chest x-ray which showed some effusion and edema for which she was given 1 time dose of Lasix intravenously and she is responding to that.  She reports her chest pain is more a burning feeling in the substernal region and also some in the throat. In the ED she was noted to have bleeding from her right external ear for which she underwent CT scan of the head which did not show any other pathology and this was thought to be likely just from some scratching in the ear being on Coumadin.  She denies any symptoms at this time. PAST MEDICAL HISTORY:   Past Medical History  Diagnosis Date  . A-fib     chronic  /  holter 06/2010, no brady, mild tachy  . Tachycardia     holter, 06/2010, no brady, mild  tachy  . CAD (coronary artery disease)     2 DES, [facility], 2004  . Pleural effusion  associated with pulmonary infection 03-2007  . Hypothyroid   . Hyperlipidemia   . GERD (gastroesophageal reflux disease)   . Diverticulosis of colon   . Allergy   . Anemia   . Arthritis     osteo  . Hypertension   . COPD (chronic obstructive pulmonary disease)   . Mitral regurgitation   . Vitamin B deficiency   . Compression fracture of lumbar vertebra 05-2008  . Osteoporosis   . Thrombocytopenia   . SOB (shortness of breath)   . OA (osteoarthritis)   . LBBB (left bundle branch block)   . Hx of colonoscopy   . Warfarin anticoagulation   . Ejection fraction   . TR (tricuspid regurgitation)     moderate, echo 2008  . Dyslipidemia   . Lung abnormality     Dr [name] 122/2011 no further w/u needed  . Prolonged QT interval     when on sotolol in past  . Volume overload     intermittant  . Hypertension   . Weight loss     August, 2012  . Nausea     Some nausea with medications, May, 2013,  . Orthostatic hypotension     June, 2014  . Shingles    PAST SURGICAL HISTORY:   Past Surgical History  Procedure Laterality Date  . Thyroidectomy  1962  . Tonsillectomy     SOCIAL HISTORY:   History  Substance Use Topics  . Smoking status: Former Smoker    Quit date: 09/14/1947  . Smokeless tobacco: Never Used  . Alcohol Use: No   lives at [facility] FAMILY HISTORY:   Family History  Problem Relation Age of Onset  . Heart disease Mother 68  . Stroke Father 24  . Heart disease Sister   . Cancer Brother     colon  . Diabetes Brother   . Leukemia Sister    DRUG ALLERGIES:   Allergies  Allergen Reactions  . Sulfonamide Derivatives Nausea And Vomiting    REACTION: sick  . Alendronate Sodium Other (See Comments)    REACTION: GI   REVIEW OF SYSTEMS:   Review of Systems  Constitutional: Negative for fever, weight loss, malaise/fatigue and diaphoresis.  HENT: Positive for ear discharge (bleeding from right ear). Negative for ear pain, hearing loss, nosebleeds, sore  throat and tinnitus.   Eyes: Negative for blurred vision and pain.  Respiratory: Positive for hemoptysis. Negative for cough, shortness of breath and wheezing.   Cardiovascular: Positive for chest pain. Negative for palpitations, orthopnea and leg swelling.  Gastrointestinal: Positive for heartburn and vomiting. Negative for nausea, abdominal pain, diarrhea, constipation and blood in stool.  Genitourinary: Negative for dysuria, urgency and frequency.  Musculoskeletal: Negative for myalgias and back pain.  Skin: Negative for itching and rash.  Neurological: Negative for dizziness, tingling, tremors, focal weakness, seizures, weakness and headaches.  Psychiatric/Behavioral: Negative for depression. The patient is not nervous/anxious.    MEDICATIONS AT HOME:   Prior to Admission medications   Medication Sig Start Date End Date Taking? Authorizing Provider  calcium carbonate (OS-CAL) 600 MG TABS tablet Take 1,200 mg by mouth at bedtime.   Yes Historical Provider, MD  ferrous sulfate 325 (65 FE) MG tablet Take 325 mg by mouth daily. With lunch   Yes Historical Provider, MD  gabapentin (NEURONTIN) 300 MG capsule Take 1-2 tabs in AM, 1-2 at lunch, and 1 in the eventing Patient taking differently: Take 300-600 mg by mouth 3 (three) times daily. 2 capsules every morning, 2 capsules at lunch, and 1 capsule every evening 10/24/14  Yes Tonia Ghent, MD  Glucosamine HCl 500 MG TABS Take 1 tablet by mouth daily.   Yes Historical Provider, MD  levothyroxine (SYNTHROID, LEVOTHROID) 88 MCG tablet TAKE 1 TABLET EVERY DAY 04/03/15  Yes Abner Greenspan, MD  Lutein 6 MG CAPS Take 1 capsule by mouth daily.    Yes Historical Provider, MD  metoprolol (LOPRESSOR) 50 MG tablet TAKE 1/2 TABLET TWICE DAILY 04/03/15  Yes Abner Greenspan, MD  midodrine (PROAMATINE) 5 MG tablet TAKE ONE TABLET BY MOUTH TWICE DAILY WITH A MEAL Patient taking differently: TAKE ONE TABLET BY MOUTH TWICE DAILY with breakfast and lunch 03/04/15  Yes  Carlena Bjornstad, MD  Multiple Vitamin (MULTIVITAMIN) tablet Take 1 tablet by mouth daily.    Yes Historical Provider, MD  pantoprazole (PROTONIX) 40 MG tablet Take 1 tablet (40 mg total) by mouth daily. 09/23/14  Yes Abner Greenspan, MD  potassium chloride (K-DUR) 10 MEQ tablet Take 10 mEq by mouth daily. With lunch   Yes Historical Provider, MD  potassium chloride (K-DUR,KLOR-CON) 10 MEQ tablet TAKE 1 TABLET EVERY DAY Patient taking differently: TAKE 1 TABLET EVERY DAY with lunch 04/03/15  Yes Abner Greenspan, MD  senna-docusate (SENOKOT-S) 8.6-50 MG per tablet Take 1 tablet by mouth  at bedtime.   Yes Historical Provider, MD  simvastatin (ZOCOR) 5 MG tablet Take 1 tablet (5 mg total) by mouth every evening. 03/13/15  Yes Tonia Ghent, MD  warfarin (COUMADIN) 5 MG tablet Take 1 1/2 pills daily or as directed Patient taking differently: Take 5-7.5 mg by mouth every evening. 5 mg on Monday, Tuesday, Thursday, Friday, Saturday 7.5 mg on Wednesday and Sunday 02/11/15  Yes Abner Greenspan, MD  furosemide (LASIX) 20 MG tablet Take 1 tablet daily as needed for increased weight, edema and SOB Patient not taking: Reported on 04/07/2015 04/07/15   Carlena Bjornstad, MD      VITAL SIGNS:  Blood pressure 143/82, pulse 83, temperature 97.5 F (36.4 C), temperature source Oral, resp. rate 26, height 5\' 5"  (1.651 m), weight 63.957 kg (141 lb), SpO2 96 %.  PHYSICAL EXAMINATION:  Physical Exam  Constitutional: She is oriented to person, place, and time. She appears malnourished. She appears unhealthy. She has a sickly appearance.  HENT:  Head: Normocephalic and atraumatic.    Right Ear: There is hemotympanum.  Ears:  Petechiae all over her face  Right external ear cannal covered with dry blood  Eyes: Conjunctivae and EOM are normal. Pupils are equal, round, and reactive to light.  Neck: Normal range of motion. Neck supple. No tracheal deviation present. No thyromegaly present.  Cardiovascular: Normal rate,  regular rhythm and normal heart sounds.   Pulmonary/Chest: Effort normal and breath sounds normal. No respiratory distress. She has no wheezes. She exhibits no tenderness.  Abdominal: Soft. Bowel sounds are normal. She exhibits no distension. There is no tenderness.  Musculoskeletal: Normal range of motion.  Neurological: She is alert and oriented to person, place, and time. No cranial nerve deficit.  Skin: Skin is warm and dry. No rash noted.  Psychiatric: Mood and affect normal.   LABORATORY PANEL:   CBC  Recent Labs Lab 04/07/15 0910  WBC 8.5  HGB 12.7  HCT 39.5  PLT 93*   ------------------------------------------------------------------------------------------------------------------  Chemistries   Recent Labs Lab 04/07/15 0910  NA 140  K 4.3  CL 105  CO2 27  GLUCOSE 114*  BUN 28*  CREATININE 1.50*  CALCIUM 9.3   RADIOLOGY:  Ct Head Wo Contrast  04/07/2015   CLINICAL DATA:  Fall. Blood in the RIGHT ear canal. Initial encounter. hemotympanum  EXAM: CT HEAD WITHOUT CONTRAST  TECHNIQUE: Contiguous axial images were obtained from the base of the skull through the vertex without intravenous contrast.  COMPARISON:  None.  FINDINGS: No mass lesion, mass effect, midline shift, hydrocephalus, hemorrhage. No acute territorial cortical ischemia/infarct. Atrophy and chronic ischemic white matter disease is present. Benign basal ganglia calcifications are present. Motion artifact was present on the initial scan. Scan was repeated with improvement.  Soft tissue density is present in the RIGHT external auditory canal. There is no basilar skull fracture. The mastoid air cells are clear. Bilateral lens extractions. Calvarium intact.  IMPRESSION: 1. Atrophy and chronic ischemic white matter disease without acute intracranial abnormality. 2. Soft tissue density in the RIGHT external auditory canal is nonspecific by CT. This appears external to the middle ear. Correlate with physical exam. No  basilar skull fracture in this patient with reported hemotympanum.   Electronically Signed   By: Dereck Ligas M.D.   On: 04/07/2015 15:51   Dg Chest Portable 1 View  04/07/2015   CLINICAL DATA:  Chest pain and shortness of breath.  EXAM: PORTABLE CHEST - 1 VIEW  COMPARISON:  None.  FINDINGS: There is cardiomegaly with small bilateral pleural effusions. Slight distention of the azygos vein. Interstitial markings are slightly accentuated with what appear to be Kerley B-lines at the bases.  No acute osseous abnormality. Old compression fracture in the lower thoracic spine treated with vertebroplasty.  IMPRESSION: Cardiomegaly with slight interstitial edema and small bilateral pleural effusions.   Electronically Signed   By: Lorriane Shire M.D.   On: 04/07/2015 09:51   IMPRESSION AND PLAN:  81 y.o. female with a known history of coronary artery disease, atrial fibrillation on Coumadin is being admitted for unstable angina and possible heart failure  * Unstable angina: Her 2 sats troponins are negative.  Monitor on telemetry.  Get cardiology consultation, serial troponins  * Possible acute on chronic heart failure: Chest x-ray showing interstitial edema and small bilateral pleural effusions.  Continue IV Lasix at this time, strict I's and O's, daily weights.  Cardiology consultation, 2-D echo, check BNP, Monitor on telemetry. Last echo in 2014 had normal EF, will recheck echo.  * Petechiae and External ear canal bleeding: Hold Coumadin, likely due to being on blood thinner.  apply pressure dressing on the ear, if the bleeding does not stop, may need ENT consultation.  Her head, CT is negative, She does have low platelets, monitor - consider onco c/s if continues to trend low.  * Vomiting and possible hemoptysis/hematemesis: Likely from projectile vomiting and retching, start IV Protonix twice a day.  Consider GI consultation if she continues to vomit more.  Also consider ultrasound abdomen or CT scan.   Now we will hold off giving any further workup.  Monitor conservatively.  All the records are reviewed and case discussed with ED provider. Management plans discussed with the patient, family and they are in agreement.  CODE STATUS: DO NOT RESUSCITATE   TOTAL TIME TAKING CARE OF THIS PATIENT: 33 Minutes   [facility], Zelena Bushong M.D on 04/07/2015 at 4:24 PM  Between 7am to 6pm - Pager - [phone]  After 6pm go to [email or web address] - password [email or web address] [facility]  [facility]  Office  [phone]  CC: Primary care physician; Loura Pardon, MD

## 2015-04-07 NOTE — ED Notes (Signed)
PT taken off NRB and placed on 3L [place].

## 2015-04-07 NOTE — ED Notes (Signed)
[facility] administered 324mg  ASA prior to arrival and also gave 1 SL NTG pta.

## 2015-04-07 NOTE — Progress Notes (Signed)
*  PRELIMINARY RESULTS* Echocardiogram 2D Echocardiogram has been performed.  Lavell Luster Stills 04/07/2015, 8:06 PM

## 2015-04-07 NOTE — ED Notes (Signed)
Attempted to call report to [place], Therapist, sports. Secretary stated RN will call back to receive report.

## 2015-04-08 DIAGNOSIS — I5021 Acute systolic (congestive) heart failure: Secondary | ICD-10-CM | POA: Insufficient documentation

## 2015-04-08 DIAGNOSIS — I272 Pulmonary hypertension, unspecified: Secondary | ICD-10-CM | POA: Insufficient documentation

## 2015-04-08 DIAGNOSIS — I482 Chronic atrial fibrillation, unspecified: Secondary | ICD-10-CM | POA: Insufficient documentation

## 2015-04-08 DIAGNOSIS — I270 Primary pulmonary hypertension: Secondary | ICD-10-CM

## 2015-04-08 DIAGNOSIS — R627 Adult failure to thrive: Secondary | ICD-10-CM

## 2015-04-08 DIAGNOSIS — I200 Unstable angina: Secondary | ICD-10-CM

## 2015-04-08 DIAGNOSIS — I255 Ischemic cardiomyopathy: Secondary | ICD-10-CM

## 2015-04-08 LAB — CBC
HEMATOCRIT: 35.5 % (ref 35.0–47.0)
Hemoglobin: 11.7 g/dL — ABNORMAL LOW (ref 12.0–16.0)
MCH: 31.8 pg (ref 26.0–34.0)
MCHC: 33 g/dL (ref 32.0–36.0)
MCV: 96.3 fL (ref 80.0–100.0)
Platelets: 87 10*3/uL — ABNORMAL LOW (ref 150–440)
RBC: 3.68 MIL/uL — ABNORMAL LOW (ref 3.80–5.20)
RDW: 16 % — ABNORMAL HIGH (ref 11.5–14.5)
WBC: 8.4 10*3/uL (ref 3.6–11.0)

## 2015-04-08 LAB — BASIC METABOLIC PANEL
ANION GAP: 9 (ref 5–15)
BUN: 28 mg/dL — ABNORMAL HIGH (ref 6–20)
CALCIUM: 8.9 mg/dL (ref 8.9–10.3)
CO2: 29 mmol/L (ref 22–32)
CREATININE: 1.45 mg/dL — AB (ref 0.44–1.00)
Chloride: 104 mmol/L (ref 101–111)
GFR calc non Af Amer: 30 mL/min — ABNORMAL LOW (ref >60)
GFR, EST AFRICAN AMERICAN: 35 mL/min — AB (ref >60)
Glucose, Bld: 112 mg/dL — ABNORMAL HIGH (ref 65–99)
Potassium: 3.8 mmol/L (ref 3.5–5.1)
Sodium: 142 mmol/L (ref 135–145)

## 2015-04-08 LAB — APTT: APTT: 45 s — AB (ref 24–36)

## 2015-04-08 LAB — GLUCOSE, CAPILLARY: Glucose-Capillary: 112 mg/dL — ABNORMAL HIGH (ref 65–99)

## 2015-04-08 LAB — PROTIME-INR
INR: 2.57
PROTHROMBIN TIME: 27.7 s — AB (ref 11.4–15.0)

## 2015-04-08 LAB — TROPONIN I: Troponin I: 0.04 ng/mL — ABNORMAL HIGH (ref <0.031)

## 2015-04-08 LAB — TSH: TSH: 3.756 u[IU]/mL (ref 0.350–4.500)

## 2015-04-08 LAB — HEMOGLOBIN A1C: Hgb A1c MFr Bld: 5.3 % (ref 4.0–6.0)

## 2015-04-08 MED ORDER — FUROSEMIDE 10 MG/ML IJ SOLN
20.0000 mg | Freq: Two times a day (BID) | INTRAMUSCULAR | Status: DC
  Administered 2015-04-08 – 2015-04-12 (×6): 20 mg via INTRAVENOUS
  Filled 2015-04-08 (×9): qty 2

## 2015-04-08 MED ORDER — CIPROFLOXACIN-DEXAMETHASONE 0.3-0.1 % OT SUSP
4.0000 [drp] | Freq: Two times a day (BID) | OTIC | Status: DC
  Administered 2015-04-08 – 2015-04-12 (×8): 4 [drp] via OTIC
  Filled 2015-04-08: qty 7.5

## 2015-04-08 NOTE — Consult Note (Signed)
[name], [name] [id number] [birth year] Bettey Costa, MD  Reason for Consult: Bleeding from right ear  HPI: 81 y.o. Female on anticoagulation admitted for vomiting and cardiac issues found to have a bleeding ear last night.  Patient denies any fall.  Patient denies any trauma to ear canal.  Daughter reports patient reported right sided neck burning and then developed bleeding from right ear canal.  CT scan performed and revealed soft tissue density in ear canal but normal middle ear space.  Patient reports decrease in hearing in right ear.  She also reports neck pain of which came about prior to the nausea and vomiting.  Denies any prior history of ear problems.  Denies use of q-tips.  Daughter reports she rubbed her right ear after the burning and then the bleeding was noticed.  Allergies:  Allergies  Allergen Reactions  . Sulfonamide Derivatives Nausea And Vomiting    REACTION: sick  . Alendronate Sodium Other (See Comments)    REACTION: GI    ROS: Review of systems normal other than 12 systems except per HPI.  PMH:  Past Medical History  Diagnosis Date  . A-fib     chronic  /  holter 06/2010, no brady, mild tachy  . Tachycardia     holter, 06/2010, no brady, mild  tachy  . CAD (coronary artery disease)     2 DES, [facility], 2004  . Pleural effusion associated with pulmonary infection 03-2007  . Hypothyroid   . Hyperlipidemia   . GERD (gastroesophageal reflux disease)   . Diverticulosis of colon   . Allergy   . Anemia   . Arthritis     osteo  . Hypertension   . COPD (chronic obstructive pulmonary disease)   . Mitral regurgitation   . Vitamin B deficiency   . Compression fracture of lumbar vertebra 05-2008  . Osteoporosis   . Thrombocytopenia   . SOB (shortness of breath)   . OA (osteoarthritis)   . LBBB (left bundle branch block)   . Hx of colonoscopy   . Warfarin anticoagulation   . Ejection fraction   . TR (tricuspid regurgitation)     moderate, echo 2008  .  Dyslipidemia   . Lung abnormality     Dr [name] 122/2011 no further w/u needed  . Prolonged QT interval     when on sotolol in past  . Volume overload     intermittant  . Hypertension   . Weight loss     August, 2012  . Nausea     Some nausea with medications, May, 2013,  . Orthostatic hypotension     June, 2014  . Shingles     FH:  Family History  Problem Relation Age of Onset  . Heart disease Mother 10  . Stroke Father 71  . Heart disease Sister   . Cancer Brother     colon  . Diabetes Brother   . Leukemia Sister     SH:  History   Social History  . Marital Status: Widowed    Spouse Name: N/A  . Number of Children: N/A  . Years of Education: N/A   Occupational History  . Not on file.   Social History Main Topics  . Smoking status: Former Smoker    Quit date: 09/14/1947  . Smokeless tobacco: Never Used  . Alcohol Use: No  . Drug Use: No  . Sexual Activity: Not Currently   Other Topics Concern  . Not on file   Social  History Narrative   Very independent    Lives in [place] by herself           PSH:  Past Surgical History  Procedure Laterality Date  . Thyroidectomy  1962  . Tonsillectomy      Physical  Exam: GEN- CN 2-12 grossly intact and symmetric. EARS- EAC/TMs normal on left, RIGHT EAC with blood clot in it as well as laceration of canal with canal skinl lifted off of canal wall  TM unable to be visualized due to blood clot and skin OC/OP-  Xerostomia and edentulous but otherwise Oral cavity, lips, gums, ororpharynx normal with no masses or lesions.  Skin- Skin warm and dry.  Petechiae throughout face Nose- Nasal cavity without polyps or purulence. External nose and ears without masses or lesions. Eye-  EOMI, PERRLA.  Neck-  supple with no masses or lesions. No lymphadenopathy palpated. S/p Thyroidectomy with no masses.  CT Head- mastoid and middle ear on right WNL, right EAC soft tissue density consistent with blood clot   A/P: Bleeding  from right ear with laceration to canal with unknown cause  Plan-  Ciprodex drops to right ear BID x 7 days.  Cotton ball to ear for drainage.  Follow up as outpatient in 7 days for microscopy.   Tracy Kinner 04/08/2015 1:01 PM

## 2015-04-08 NOTE — Care Management (Signed)
Even though the patient address is not [facility]- confirmed again that patient is definitely a resident of the community.  She is in [address]. Confirmed her contact information and PCP.  Patient is independent in all her adls.  She does have  Access to a walker.  She has been able to walker from her apartment to the dining room for meals but does admit that over the last month, she gets "worn out" doing so.  She does not have chronic 02.  Her daughter Tommi Emery lives near by and is very supportive.  Patient would be agreeable to home health nurse if she is able to return directly home.  Physical therapy consult is pending.  Discussed need to monitor for need of home 02 during progression

## 2015-04-08 NOTE — Progress Notes (Signed)
[facility] NAME: [name]    MR#:  [id number]  DATE OF BIRTH:  1922/08/31  SUBJECTIVE:  Patient is complaining of weakness and some soreness of breath. Patient denies any chest pain. Patient continues to have pain in the right ear.  REVIEW OF SYSTEMS:    Review of Systems  Constitutional: Positive for malaise/fatigue. Negative for fever and chills.  HENT: Positive for ear discharge and ear pain. Negative for congestion and sore throat.   Eyes: Negative for blurred vision.  Respiratory: Positive for shortness of breath. Negative for cough, hemoptysis, wheezing and stridor.   Cardiovascular: Negative for chest pain, palpitations and leg swelling.  Gastrointestinal: Negative for nausea, vomiting, abdominal pain, diarrhea and blood in stool.  Genitourinary: Negative for dysuria.  Musculoskeletal: Negative for back pain.  Neurological: Positive for weakness. Negative for dizziness, tremors, sensory change, speech change, focal weakness, seizures, loss of consciousness and headaches.  Endo/Heme/Allergies: Does not bruise/bleed easily.    Tolerating Diet: Yes      DRUG ALLERGIES:   Allergies  Allergen Reactions  . Sulfonamide Derivatives Nausea And Vomiting    REACTION: sick  . Alendronate Sodium Other (See Comments)    REACTION: GI    VITALS:  Blood pressure 90/44, pulse 87, temperature 98.4 F (36.9 C), temperature source Oral, resp. rate 18, height 5\' 5"  (1.651 m), weight 59.058 kg (130 lb 3.2 oz), SpO2 91 %.  PHYSICAL EXAMINATION:   Physical Exam  Constitutional: She is oriented to person, place, and time and well-developed, well-nourished, and in no distress. No distress.  HENT:  Head: Normocephalic.  Dry blood in the ear canal  Eyes: No scleral icterus.  Neck: Normal range of motion. Neck supple. No JVD present. No tracheal deviation present.  Cardiovascular: Normal rate and regular rhythm.  Exam  reveals no gallop and no friction rub.   Murmur heard. Atrial fibrillation  Pulmonary/Chest: Effort normal and breath sounds normal. No respiratory distress. She has no wheezes. She has no rales. She exhibits no tenderness.  Decreased breath sounds throughout  Abdominal: Soft. Bowel sounds are normal. She exhibits no distension and no mass. There is no tenderness. There is no rebound and no guarding.  Musculoskeletal: Normal range of motion. She exhibits no edema.  Neurological: She is alert and oriented to person, place, and time.  Skin: Skin is warm. No rash noted. No erythema.  Petechiae on face  Psychiatric: Affect and judgment normal.      LABORATORY PANEL:   CBC  Recent Labs Lab 04/08/15 0406  WBC 8.4  HGB 11.7*  HCT 35.5  PLT 87*   ------------------------------------------------------------------------------------------------------------------  Chemistries   Recent Labs Lab 04/08/15 0406  NA 142  K 3.8  CL 104  CO2 29  GLUCOSE 112*  BUN 28*  CREATININE 1.45*  CALCIUM 8.9   ------------------------------------------------------------------------------------------------------------------  Cardiac Enzymes  Recent Labs Lab 04/07/15 1216 04/07/15 1707 04/08/15 0016  TROPONINI 0.03 0.03 0.04*   ------------------------------------------------------------------------------------------------------------------  RADIOLOGY:  Ct Head Wo Contrast  04/07/2015   CLINICAL DATA:  Fall. Blood in the RIGHT ear canal. Initial encounter. hemotympanum  EXAM: CT HEAD WITHOUT CONTRAST  TECHNIQUE: Contiguous axial images were obtained from the base of the skull through the vertex without intravenous contrast.  COMPARISON:  None.  FINDINGS: No mass lesion, mass effect, midline shift, hydrocephalus, hemorrhage. No acute territorial cortical ischemia/infarct. Atrophy and chronic ischemic white matter disease is present. Benign basal ganglia calcifications are present. Motion  artifact was present on the initial scan. Scan was repeated with improvement.  Soft tissue density is present in the RIGHT external auditory canal. There is no basilar skull fracture. The mastoid air cells are clear. Bilateral lens extractions. Calvarium intact.  IMPRESSION: 1. Atrophy and chronic ischemic white matter disease without acute intracranial abnormality. 2. Soft tissue density in the RIGHT external auditory canal is nonspecific by CT. This appears external to the middle ear. Correlate with physical exam. No basilar skull fracture in this patient with reported hemotympanum.   Electronically Signed   By: Dereck Ligas M.D.   On: 04/07/2015 15:51   Dg Chest Portable 1 View  04/07/2015   CLINICAL DATA:  Chest pain and shortness of breath.  EXAM: PORTABLE CHEST - 1 VIEW  COMPARISON:  None.  FINDINGS: There is cardiomegaly with small bilateral pleural effusions. Slight distention of the azygos vein. Interstitial markings are slightly accentuated with what appear to be Kerley B-lines at the bases.  No acute osseous abnormality. Old compression fracture in the lower thoracic spine treated with vertebroplasty.  IMPRESSION: Cardiomegaly with slight interstitial edema and small bilateral pleural effusions.   Electronically Signed   By: Lorriane Shire M.D.   On: 04/07/2015 09:51     ASSESSMENT AND PLAN:   This is a very pleasant 81 year old female with history of CAD, atrial fibrillation on chronic Coumadin who was admitted for unstable angina and acute on chronic CHF.  1. Unstable angina: Troponins 3 are relatively negative. Telemetry shows no acute changes she does have atrophic relation. Cardiology consultation is pending. Further recommendations and workup as per cardiology. Continue current medications including metoprolol and Zocor. Coumadin is currently on hold due to blood in ear canal.  2. Acute on chronic congestive heart failure: Echocardiogram is pending to evaluate for systolic and  diastolic dysfunction. Continue Lasix. Continue to monitor I's and O's. BNP and chest x-ray were consistent with CHF exacerbation on admission. Cardiology is consulted. Further recommendations as per cardiology. Monitor creatinine and potassium while on Lasix. 3. Bleeding in the ear canal: Coumadin is on hold. ENT consultation has been placed for further evaluation and management.  4. CHRONIC atrial fibrillation: Patient's heart rate is controlled. Holding Coumadin due to problem #3. Continue metoprolol.  5. Vomiting: Patient's vomiting is improved. Continue PPI.  6. Petechia to the face after a vomiting episode. Stop Coumadin for now.  7. Weakness: PT consultation has been placed as well as social work consult. Patient does live at independent living at this time.      Management plans discussed with the patient and daughter and they are in agreement.[name]  CODE STATUS: DO NOT RESUSCITATE  TOTAL TIME TAKING CARE OF THIS PATIENT: 35 minutes.   Greater than 50% counseling and coordination of care  POSSIBLE D/C  2-3 days DEPENDING ON CLINICAL CONDITION.   Milee Qualls M.D on 04/08/2015 at 11:20 AM  Between 7am to 6pm - Pager - [phone] After 6pm go to [email or web address] - password [email or web address] [facility]  [facility]  Office  [phone]  CC: Primary care physician; Loura Pardon, MD

## 2015-04-08 NOTE — Plan of Care (Signed)
Problem: Phase I Progression Outcomes Goal: Anginal pain relieved Outcome: Progressing Pt no longer c/o chest pain, nausea, or vomiting. Pt remains in Afib 50-90s, BP 110-140s/50-70s, Pt on 2L [place] SpO2 > 90%. Pt Right ear continues to bleed, gauze in ear.

## 2015-04-08 NOTE — Care Management (Signed)
Patient presents from home.  She resides at [facility].  She has had some bleeding from the external ear canal most likely due to scratching and patient being on coumadin.  It is reported that  ENT consult is pending due to continued oozing. Patient presented with ED with shortness of breath and required non rebreather mask in the ED.  Discussed the need to assess need for home 02

## 2015-04-08 NOTE — Progress Notes (Signed)
Patient right ear still oozing blood , was seen by ENT and ear drop order, will continue to monitor

## 2015-04-08 NOTE — Progress Notes (Signed)
Patient bp 90/44  DR Benjie Karvonen was informed and order for lopressor and lasix  Dose to be held, will continue to monitor

## 2015-04-08 NOTE — Consult Note (Addendum)
Cardiology Consultation Note  Patient ID: [name], MRN: [id number], DOB/AGE: September 11, 1922 81 y.o. Admit date: 04/07/2015   Date of Consult: 04/08/2015 Primary Physician: [name] Pardon, MD Primary Cardiologist: [name], M.D.  Chief Complaint: Chest pain, shortness of breath, and weakness Reason for Consult: Chest pain, possible CHF  HPI: 81 y.o. female with h/o coronary artery disease, atrial fibrillation on Coumadin monitored regularly by [name]. Ron Branch, chronic combined systolic and diastolic CHF, mitral regurgitation, tricuspid regurgitation, prolonged QT, LBBB, carotid disease, venous insufficiency, HTN, thrombocytopenia, and calorie malnutrition presented to the ED on 04/07/15 with complaints of nausea, bloody emesis, burning chest pain that extended into the neck, shortness of breath, and worsening weakness over the last 3 weeks. She has also had a productive cough for several days.  She had a heart catheterization in 2004 with placement of 2 DES's, with resultant A. Fib that is resistant to cardioversion.  Previous inferolateral infarct with mild to moderate peri-infarct ischemia 2008.  Stress test on 05/24/12 with [name]. Ron Branch showed LV Ejection Fraction: 57%; LV Wall Motion: There is septal dyssynergy related to the patient's underlying left bundle branch block. Cardioversion 05/27/12, unsuccessful.  Echo 04/07/15 showed normal LV size, EF 30-35% (down from Echo on 02/28/13: EF 50-55%), hypokenesis of the anteroseptal myocardium, MV moderate regurg, mildly dilated left atrium and right ventricle, moderate to severe TR, and PA peak pressure 57 mmHg.  Patient has been having fluctuating weight since last Wednesday and tried oral Lasix at home, which did not help much. She has had to increase the number of pillows she sleeps on from 1 to 2 and she has had leg swelling and abdominal fullness/distension.  She lives at [facility]. She was not feeling well the night of 7/24 with  complaint of burning chest pain radiating to the neck and shoulder blade. On the morning of 7/25 she was feeling sick and threw up twice with some blood in it. Immediately after that. EMS was called, who noted that she had new onset petechiae on her face all over and she was brought down to the emergency department. While in the ED, she underwent chest x-ray which showed some effusion and edema for which she was given 1 time dose of Lasix intravenously. In the ED she was noted to have bleeding from her right external ear for which she underwent CT scan of the head which did not show any other pathology.  Her ear continues to bleed on 7/26.  Troponins are flat trending, negative x3 (0.03--> 0.03 --> 0.04). BNP from 04/07/15: 779.  On 7/26, patient's daughter was present at bedside.  Patient states she is feeling better since she arrived at the ED, but she still has some mild burning chest and neck pain.  Her daughter states her leg swelling is improved.  She also mentioned that her mother has had a productive cough over the last few days, but a family member had something similar and was sent to the ED for chest pain, productive cough, and blisters in the back of the throat.  This family member has spent time around [name] during her illness.  She denies fever, chills, dizziness, or diaphoresis.  Past Medical History  Diagnosis Date  . A-fib     chronic  /  holter 06/2010, no brady, mild tachy  . Tachycardia     holter, 06/2010, no brady, mild  tachy  . CAD (coronary artery disease)     2 DES, [facility], 2004  .  Pleural effusion associated with pulmonary infection 03-2007  . Hypothyroid   . Hyperlipidemia   . GERD (gastroesophageal reflux disease)   . Diverticulosis of colon   . Allergy   . Anemia   . Arthritis     osteo  . Hypertension   . COPD (chronic obstructive pulmonary disease)   . Mitral regurgitation   . Vitamin B deficiency   . Compression fracture of lumbar vertebra 05-2008  .  Osteoporosis   . Thrombocytopenia   . SOB (shortness of breath)   . OA (osteoarthritis)   . LBBB (left bundle branch block)   . Hx of colonoscopy   . Warfarin anticoagulation   . Ejection fraction   . TR (tricuspid regurgitation)     moderate, echo 2008  . Dyslipidemia   . Lung abnormality     [name] 122/2011 no further w/u needed  . Prolonged QT interval     when on sotolol in past  . Volume overload     intermittant  . Hypertension   . Weight loss     August, 2012  . Nausea     Some nausea with medications, May, 2013,  . Orthostatic hypotension     June, 2014  . Shingles       Most Recent Cardiac Studies: Stress test (05/24/12): LV Ejection Fraction: 57%; LV Wall Motion: There is septal dyssynergy related to the patient's underlying left bundle branch block.   2D Echo (04/07/15): - Left ventricle: The cavity size was normal. Systolic function was moderately to severely reduced. The estimated ejection fraction was in the range of 30% to 35%. Hypokinesis of the anterior myocardium. Hypokinesis of the anteroseptal myocardium. - Mitral valve: There was moderate regurgitation. - Left atrium: The atrium was mildly dilated. - Right ventricle: The cavity size was mildly dilated. Wall thickness was normal. Systolic function was mildly reduced. - Right atrium: The atrium was moderately dilated. - Tricuspid valve: There was moderate-severe regurgitation. - Pulmonary arteries: Systolic pressure was moderately increased. PA peak pressure: 57 mm Hg (S).   Surgical History:  Past Surgical History  Procedure Laterality Date  . Thyroidectomy  1962  . Tonsillectomy       Home Meds: Prior to Admission medications   Medication Sig Start Date End Date Taking? Authorizing Provider  calcium carbonate (OS-CAL) 600 MG TABS tablet Take 1,200 mg by mouth at bedtime.   Yes Historical Provider, MD  ferrous sulfate 325 (65 FE) MG tablet Take 325 mg by mouth daily. With lunch    Yes Historical Provider, MD  gabapentin (NEURONTIN) 300 MG capsule Take 1-2 tabs in AM, 1-2 at lunch, and 1 in the eventing Patient taking differently: Take 300-600 mg by mouth 3 (three) times daily. 2 capsules every morning, 2 capsules at lunch, and 1 capsule every evening 10/24/14  Yes Tonia Ghent, MD  Glucosamine HCl 500 MG TABS Take 1 tablet by mouth daily.   Yes Historical Provider, MD  levothyroxine (SYNTHROID, LEVOTHROID) 88 MCG tablet TAKE 1 TABLET EVERY DAY 04/03/15  Yes Abner Greenspan, MD  Lutein 6 MG CAPS Take 1 capsule by mouth daily.    Yes Historical Provider, MD  metoprolol (LOPRESSOR) 50 MG tablet TAKE 1/2 TABLET TWICE DAILY 04/03/15  Yes Abner Greenspan, MD  midodrine (PROAMATINE) 5 MG tablet TAKE ONE TABLET BY MOUTH TWICE DAILY WITH A MEAL Patient taking differently: TAKE ONE TABLET BY MOUTH TWICE DAILY with breakfast and lunch 03/04/15  Yes Carlena Bjornstad, MD  Multiple Vitamin (MULTIVITAMIN) tablet Take 1 tablet by mouth daily.    Yes Historical Provider, MD  pantoprazole (PROTONIX) 40 MG tablet Take 1 tablet (40 mg total) by mouth daily. 09/23/14  Yes Abner Greenspan, MD  potassium chloride (K-DUR) 10 MEQ tablet Take 10 mEq by mouth daily. With lunch   Yes Historical Provider, MD  potassium chloride (K-DUR,KLOR-CON) 10 MEQ tablet TAKE 1 TABLET EVERY DAY Patient taking differently: TAKE 1 TABLET EVERY DAY with lunch 04/03/15  Yes Abner Greenspan, MD  senna-docusate (SENOKOT-S) 8.6-50 MG per tablet Take 1 tablet by mouth at bedtime.   Yes Historical Provider, MD  simvastatin (ZOCOR) 5 MG tablet Take 1 tablet (5 mg total) by mouth every evening. 03/13/15  Yes Tonia Ghent, MD  warfarin (COUMADIN) 5 MG tablet Take 1 1/2 pills daily or as directed Patient taking differently: Take 5-7.5 mg by mouth every evening. 5 mg on Monday, Tuesday, Thursday, Friday, Saturday 7.5 mg on Wednesday and Sunday 02/11/15  Yes Abner Greenspan, MD  furosemide (LASIX) 20 MG tablet Take 1 tablet daily as needed for  increased weight, edema and SOB Patient not taking: Reported on 04/07/2015 04/07/15   Carlena Bjornstad, MD    Inpatient Medications:  . calcium carbonate  400 mg of elemental calcium Oral QHS  . ferrous sulfate  325 mg Oral Daily  . furosemide  20 mg Intravenous Daily  . gabapentin  300 mg Oral QHS  . gabapentin  600 mg Oral BID WC  . levothyroxine  88 mcg Oral QAC breakfast  . metoprolol  25 mg Oral BID  . pantoprazole (PROTONIX) IV  40 mg Intravenous Q12H  . potassium chloride  10 mEq Oral Daily  . senna-docusate  1 tablet Oral QHS  . simvastatin  5 mg Oral QPM  . sodium chloride  3 mL Intravenous Q12H  . sodium chloride  3 mL Intravenous Q12H      Allergies:  Allergies  Allergen Reactions  . Sulfonamide Derivatives Nausea And Vomiting    REACTION: sick  . Alendronate Sodium Other (See Comments)    REACTION: GI    History   Social History  . Marital Status: Widowed    Spouse Name: N/A  . Number of Children: N/A  . Years of Education: N/A   Occupational History  . Not on file.   Social History Main Topics  . Smoking status: Former Smoker    Quit date: 09/14/1947  . Smokeless tobacco: Never Used  . Alcohol Use: No  . Drug Use: No  . Sexual Activity: Not Currently   Other Topics Concern  . Not on file   Social History Narrative   Very independent    Lives in [place] by herself            Family History  Problem Relation Age of Onset  . Heart disease Mother 28  . Stroke Father 41  . Heart disease Sister   . Cancer Brother     colon  . Diabetes Brother   . Leukemia Sister      Review of Systems: Review of Systems  Constitutional: Positive for malaise/fatigue. Negative for fever, chills, weight loss and diaphoresis.  HENT: Positive for ear discharge. Negative for congestion and sore throat.        Bleeding from right ear  Eyes: Negative for double vision and discharge.  Respiratory: Positive for cough, sputum production and shortness of breath.  Negative for hemoptysis and wheezing.  Yellow sputum  Cardiovascular: Positive for chest pain, palpitations, orthopnea, leg swelling and PND.       Feels palpitations more strongly after exertion  Gastrointestinal: Positive for heartburn, nausea and vomiting. Negative for abdominal pain, diarrhea and blood in stool.       Hemoptysis  Musculoskeletal: Positive for neck pain. Negative for falls.  Skin: Positive for rash.       Petechia and redness on face  Neurological: Positive for weakness. Negative for dizziness, seizures and loss of consciousness.  Endo/Heme/Allergies: Bruises/bleeds easily.  Psychiatric/Behavioral: The patient is not nervous/anxious.   All other systems reviewed and are negative.   Labs:  Recent Labs  04/07/15 0910 04/07/15 1216 04/07/15 1707 04/08/15 0016  TROPONINI 0.03 0.03 0.03 0.04*   Lab Results  Component Value Date   WBC 8.4 04/08/2015   HGB 11.7* 04/08/2015   HCT 35.5 04/08/2015   MCV 96.3 04/08/2015   PLT 87* 04/08/2015    Recent Labs Lab 04/08/15 0406  NA 142  K 3.8  CL 104  CO2 29  BUN 28*  CREATININE 1.45*  CALCIUM 8.9  GLUCOSE 112*   Lab Results  Component Value Date   CHOL 133 09/17/2013   HDL 51.50 09/17/2013   LDLCALC 67 09/17/2013   TRIG 71.0 09/17/2013   Lab Results  Component Value Date   DDIMER * 03/10/2007    1.36        AT THE INHOUSE ESTABLISHED CUTOFF VALUE OF 0.48 ug/mL FEU, THIS ASSAY HAS BEEN DOCUMENTED IN THE LITERATURE TO HAVE    Radiology/Studies:  Ct Head Wo Contrast  04/07/2015   CLINICAL DATA:  Fall. Blood in the RIGHT ear canal. Initial encounter. hemotympanum  EXAM: CT HEAD WITHOUT CONTRAST  TECHNIQUE: Contiguous axial images were obtained from the base of the skull through the vertex without intravenous contrast.  COMPARISON:  None.  FINDINGS: No mass lesion, mass effect, midline shift, hydrocephalus, hemorrhage. No acute territorial cortical ischemia/infarct. Atrophy and chronic ischemic  white matter disease is present. Benign basal ganglia calcifications are present. Motion artifact was present on the initial scan. Scan was repeated with improvement.  Soft tissue density is present in the RIGHT external auditory canal. There is no basilar skull fracture. The mastoid air cells are clear. Bilateral lens extractions. Calvarium intact.  IMPRESSION: 1. Atrophy and chronic ischemic white matter disease without acute intracranial abnormality. 2. Soft tissue density in the RIGHT external auditory canal is nonspecific by CT. This appears external to the middle ear. Correlate with physical exam. No basilar skull fracture in this patient with reported hemotympanum.   Electronically Signed   By: Dereck Ligas M.D.   On: 04/07/2015 15:51   Dg Chest Portable 1 View  04/07/2015   CLINICAL DATA:  Chest pain and shortness of breath.  EXAM: PORTABLE CHEST - 1 VIEW  COMPARISON:  None.  FINDINGS: There is cardiomegaly with small bilateral pleural effusions. Slight distention of the azygos vein. Interstitial markings are slightly accentuated with what appear to be Kerley B-lines at the bases.  No acute osseous abnormality. Old compression fracture in the lower thoracic spine treated with vertebroplasty.  IMPRESSION: Cardiomegaly with slight interstitial edema and small bilateral pleural effusions.   Electronically Signed   By: Lorriane Shire M.D.   On: 04/07/2015 09:51    EKG: A fib, 80 BPM, left axis deviation, LBBB, no significant change from EKG on 02/28/13  Weights: Filed Weights   04/07/15 0855 04/08/15 0354  Weight: 141 lb (63.957  kg) 130 lb 3.2 oz (59.058 kg)     Physical Exam: Blood pressure 126/72, pulse 92, temperature 99.4 F (37.4 C), temperature source Oral, resp. rate 20, height 5\' 5"  (1.651 m), weight 130 lb 3.2 oz (59.058 kg), SpO2 93 %. Body mass index is 21.67 kg/(m^2). General:Very weak and frail female, in no acute distress, laying in bed with daughter at bedside Head:  Normocephalic, atraumatic, sclera non-icteric, no xanthomas, nares are without discharge.  Redness and petechia around mouth and cheeks.  Cotton in right ear d/t continued bleeding, fresh blood noted.  Neck: Negative for carotid bruits. JVD not elevated. Lungs: Decreased breath sounds at bases bilaterally.  No wheezes, rales, or rhonchi. Breathing is unlabored. Heart: Irregularly irregular rhythm with S1 S2. No murmurs, rubs, or gallops appreciated. Abdomen: Soft, non-tender, slightly distended with normoactive bowel sounds. No hepatomegaly. No rebound/guarding. No obvious abdominal masses. Msk:  Strength and tone appear diminished Extremities: No clubbing or cyanosis. No edema.  Some bruising on lower legs bilaterally.  Distal pedal pulses are 2+ and equal bilaterally. Neuro: Alert and oriented X 3. No facial asymmetry. No focal deficit. Moves all extremities spontaneously. Psych:  Responds to questions appropriately with a normal affect.    Assessment and Plan:  81 y.o. female with h/o coronary artery disease, atrial fibrillation on Coumadin monitored regularly by [name]. Ron Branch, chronic combined systolic and diastolic CHF, mitral regurgitation, tricuspid regurgitation, prolonged QT, LBBB, carotid disease, venous insufficiency, HTN, thrombocytopenia, and calorie malnutrition presented to the ED on 04/07/15 with complaints of nausea, bloody emesis, burning chest pain that extended into the neck, shortness of breath, and worsening weakness over the last 3 weeks. She has also had a productive cough for several days.  1. Chest pain:  - constant burning chest pain that radiates to the neck and shoulder blades for 24 hours - troponins are flat trending, negative x3 (0.03--> 0.03 --> 0.04) - tele shows Atrial fib, HR low 80's-low 100's on 7/26 A.M. -last stress test in 05/24/12 as above, echo from 04/07/15 as above  2. New Acute systolic heart failure:  -chest x-ray showing interstitial edema and small  bilateral pleural effusions -received on injection of 40mg  Lasix on 7/25 and then injections of 20 mg Lasix are ordered daily, restricted I's and O's ordered per IM along with daily weights.  -down 11 lbs. since admission (141 lb 7/25 --> 130 lb 7/26) and 560 mL -2D Echo from 04/07/15 as above (EF decreased from normal EF on last Echo 02/28/13), BNP from 04/07/15: 779  3. Atrial fib: -chronic -continue metoprolol  4. HTN -on metoprolol and Lasix -last BP reading 126/72, continue to monitor  5. Hyperlipidemia -continue statin  6. Vomiting and possible hemoptysis/hematemesis:  -likely from projectile vomiting and retching, started on IV Protonix BID per IM on 7/25  7. Petechiae and External ear canal bleeding:  -monitered by IM -coumadin held during this admission, her head CT is negative, ENT consult ordered by IM on 7/26 S  8. Thrombocytopenia and erythrocytopenia  - monitored by IM, onc consult being considered  Signed, Christell Faith, PA-C Pager: [phone] 04/08/2015, 8:06 AM   Attending Note Patient seen and examined, agree with detailed note above,  Patient presentation and plan discussed on rounds.   Patient presenting with severe nausea, vomiting, chest pain Cardiac enzymes negative 3 Echocardiogram showing moderately depressed LV function, concern for anterior wall hypokinesis/LAD disease EF certainly more depressed from 2014 when ejection fraction was normal High right-sided pressures, greater than  50 mmHg, possibly from depressed LV function. This is consistent with increasing shortness of breath over the past several weeks.  -She has chronic atrial fibrillation, has slowly been getting weaker. DNR/DNI. Options discussed at length with her daughter.  No indication for aggressive ischemia workup at this time given cardiac enzymes are negative.  Patient barely able to open her eyes as she is very fatigued from last several nights being awake. Recommended to the  family that we proceed with gentle diuresis given her elevated pressures, close monitoring of her renal function.  ---Would decrease dosing of Lasix if creatinine her BUN starts to climb When she returns to her baseline, could consider [place] if family would like workup. therwise would continue with supportive medical management --Per the daughter, patient is relatively independent, moderately well functioning until prior to this admission  Signed: Esmond Plants  M.D., Ph.D. [facility]

## 2015-04-08 NOTE — Progress Notes (Signed)
PT Cancellation Note  Patient Details Name: [name] MRN: [id number] DOB: 02/02/1922   Cancelled Treatment:    Reason Eval/Treat Not Completed: Other (comment) (Pt admitted with unstable angina and possible heart failure; cardiology consult still pending).  Will hold PT until after cardiology consult and will initiate PT eval at a later date/time as medically appropriate.   Leitha Bleak 04/08/2015, 1:37 PM Leitha Bleak, [place]

## 2015-04-09 ENCOUNTER — Encounter: Payer: Self-pay | Admitting: Physician Assistant

## 2015-04-09 DIAGNOSIS — I952 Hypotension due to drugs: Secondary | ICD-10-CM

## 2015-04-09 LAB — BASIC METABOLIC PANEL
Anion gap: 9 (ref 5–15)
BUN: 28 mg/dL — ABNORMAL HIGH (ref 6–20)
CO2: 30 mmol/L (ref 22–32)
Calcium: 8.5 mg/dL — ABNORMAL LOW (ref 8.9–10.3)
Chloride: 100 mmol/L — ABNORMAL LOW (ref 101–111)
Creatinine, Ser: 1.22 mg/dL — ABNORMAL HIGH (ref 0.44–1.00)
GFR calc non Af Amer: 38 mL/min — ABNORMAL LOW (ref >60)
GFR, EST AFRICAN AMERICAN: 44 mL/min — AB (ref >60)
Glucose, Bld: 99 mg/dL (ref 65–99)
Potassium: 3.8 mmol/L (ref 3.5–5.1)
Sodium: 139 mmol/L (ref 135–145)

## 2015-04-09 LAB — CBC
HCT: 34.9 % — ABNORMAL LOW (ref 35.0–47.0)
HEMOGLOBIN: 11.6 g/dL — AB (ref 12.0–16.0)
MCH: 31.9 pg (ref 26.0–34.0)
MCHC: 33.1 g/dL (ref 32.0–36.0)
MCV: 96.2 fL (ref 80.0–100.0)
Platelets: 88 10*3/uL — ABNORMAL LOW (ref 150–440)
RBC: 3.63 MIL/uL — AB (ref 3.80–5.20)
RDW: 16 % — ABNORMAL HIGH (ref 11.5–14.5)
WBC: 7.8 10*3/uL (ref 3.6–11.0)

## 2015-04-09 LAB — GLUCOSE, CAPILLARY: Glucose-Capillary: 93 mg/dL (ref 65–99)

## 2015-04-09 MED ORDER — LISINOPRIL 5 MG PO TABS
5.0000 mg | ORAL_TABLET | Freq: Every day | ORAL | Status: DC
  Filled 2015-04-09: qty 1

## 2015-04-09 MED ORDER — MIDODRINE HCL 5 MG PO TABS
5.0000 mg | ORAL_TABLET | Freq: Two times a day (BID) | ORAL | Status: DC
Start: 2015-04-09 — End: 2015-04-12
  Administered 2015-04-09 – 2015-04-12 (×6): 5 mg via ORAL
  Filled 2015-04-09 (×6): qty 1

## 2015-04-09 MED ORDER — PANTOPRAZOLE SODIUM 40 MG PO TBEC
40.0000 mg | DELAYED_RELEASE_TABLET | Freq: Two times a day (BID) | ORAL | Status: DC
  Administered 2015-04-09 – 2015-04-12 (×6): 40 mg via ORAL
  Filled 2015-04-09 (×6): qty 1

## 2015-04-09 MED ORDER — LISINOPRIL 5 MG PO TABS: 5.0000 mg | ORAL_TABLET | Freq: Every day | ORAL | Status: DC

## 2015-04-09 NOTE — Progress Notes (Signed)
Blood pressure has been low. Notified Dr. Benjie Karvonen that patient takes midodrine for BP at home, MD will order.

## 2015-04-09 NOTE — Progress Notes (Addendum)
Alert and oriented. Afib on tele. Weaned to 2L of oxygen today. Blood pressure has been low, midodrine has been ordered as per home medications. Patient has slept most of the day between care. No complaints of pain other than her ear for a brief period that resolved on it's own. Ear is still draining a small amount of blood. Drops were given this AM. PT worked with patient today and recommended home health. Daughter has some concern about the patient coming to stay with her and someone being with her full time, will put her in contact with care management tomorrow. Will continue to monitor.

## 2015-04-09 NOTE — Progress Notes (Signed)
   04/09/15 2100  Clinical Encounter Type  Visited With Patient and family together  Visit Type Spiritual support  Spiritual Encounters  Spiritual Needs Prayer  Stress Factors  Patient Stress Factors Health changes   Status: alert and oriented/unstable angina Age/Sex: female 40 Faith: [employer] Family: daughter [name] beside Visit Assessment: The patient had a tough day today but she did thank the Chaplain for the visit. Her daughter said that her platelets were low. Silent prayers were offered for her recovery and comfort.  Pastoral care can be reached via pager [phone] or by submitting an order online

## 2015-04-09 NOTE — Progress Notes (Signed)
[facility] [place] NAME: [name]    MR#:  [id number]  DATE OF BIRTH:  [birth year]  SUBJECTIVE:  Patient's blood pressure was low this morning. No acute events overnight. She was seen and evaluated by cardiology and ENT. He still feels weak. REVIEW OF SYSTEMS:    Review of Systems  Constitutional: Positive for malaise/fatigue. Negative for fever and chills.  HENT: Positive for ear discharge. Negative for congestion, ear pain and sore throat.   Eyes: Negative for blurred vision.  Respiratory: Negative for cough, hemoptysis, shortness of breath, wheezing and stridor.   Cardiovascular: Negative for chest pain, palpitations and leg swelling.  Gastrointestinal: Negative for nausea, vomiting, abdominal pain, diarrhea and blood in stool.  Genitourinary: Negative for dysuria.  Musculoskeletal: Negative for back pain.  Neurological: Positive for weakness. Negative for dizziness, tremors, sensory change, speech change, focal weakness, seizures, loss of consciousness and headaches.  Endo/Heme/Allergies: Does not bruise/bleed easily.    Tolerating Diet: Yes      DRUG ALLERGIES:   Allergies  Allergen Reactions  . Sulfonamide Derivatives Nausea And Vomiting    REACTION: sick  . Alendronate Sodium Other (See Comments)    REACTION: GI    VITALS:  Blood pressure 106/67, pulse 92, temperature 97.8 F (36.6 C), temperature source Oral, resp. rate 18, height 5\' 5"  (1.651 m), weight 58.877 kg (129 lb 12.8 oz), SpO2 96 %.  PHYSICAL EXAMINATION:   Physical Exam  Constitutional: She is oriented to person, place, and time and well-developed, well-nourished, and in no distress. No distress.  HENT:  Head: Normocephalic.  Cotton ball in right ear canal  Eyes: No scleral icterus.  Neck: Normal range of motion. Neck supple. No JVD present. No tracheal deviation present.  Cardiovascular: Normal rate and regular rhythm.  Exam reveals no  gallop and no friction rub.   Murmur heard. Atrial fibrillation  Pulmonary/Chest: Effort normal and breath sounds normal. No respiratory distress. She has no wheezes. She has no rales. She exhibits no tenderness.  Decreased breath sounds throughout  Abdominal: Soft. Bowel sounds are normal. She exhibits no distension and no mass. There is no tenderness. There is no rebound and no guarding.  Musculoskeletal: Normal range of motion. She exhibits no edema.  Neurological: She is alert and oriented to person, place, and time.  Skin: Skin is warm. No rash noted. No erythema.  Petechiae on face  Psychiatric: Affect and judgment normal.      LABORATORY PANEL:   CBC  Recent Labs Lab 04/09/15 0329  WBC 7.8  HGB 11.6*  HCT 34.9*  PLT 88*   ------------------------------------------------------------------------------------------------------------------  Chemistries   Recent Labs Lab 04/09/15 0329  NA 139  K 3.8  CL 100*  CO2 30  GLUCOSE 99  BUN 28*  CREATININE 1.22*  CALCIUM 8.5*   ------------------------------------------------------------------------------------------------------------------  Cardiac Enzymes  Recent Labs Lab 04/07/15 1216 04/07/15 1707 04/08/15 0016  TROPONINI 0.03 0.03 0.04*   ------------------------------------------------------------------------------------------------------------------  RADIOLOGY:  Ct Head Wo Contrast  04/07/2015   CLINICAL DATA:  Fall. Blood in the RIGHT ear canal. Initial encounter. hemotympanum  EXAM: CT HEAD WITHOUT CONTRAST  TECHNIQUE: Contiguous axial images were obtained from the base of the skull through the vertex without intravenous contrast.  COMPARISON:  None.  FINDINGS: No mass lesion, mass effect, midline shift, hydrocephalus, hemorrhage. No acute territorial cortical ischemia/infarct. Atrophy and chronic ischemic white matter disease is present. Benign basal ganglia calcifications are present. Motion artifact was  present  on the initial scan. Scan was repeated with improvement.  Soft tissue density is present in the RIGHT external auditory canal. There is no basilar skull fracture. The mastoid air cells are clear. Bilateral lens extractions. Calvarium intact.  IMPRESSION: 1. Atrophy and chronic ischemic white matter disease without acute intracranial abnormality. 2. Soft tissue density in the RIGHT external auditory canal is nonspecific by CT. This appears external to the middle ear. Correlate with physical exam. No basilar skull fracture in this patient with reported hemotympanum.   Electronically Signed   By: Dereck Ligas M.D.   On: 04/07/2015 15:51     ASSESSMENT AND PLAN:   This is a very pleasant 81 year old female with history of CAD, atrial fibrillation on chronic Coumadin who was admitted for unstable angina and acute on chronic CHF.  1. Chest pain: Troponins 3 are relatively negative. Patient has been seen and evaluated by cardiology. Her 2-D echocardiogram showed an ejection fraction of 30-35% with wall motion abnormalities in the anterior anterior septal area. There has been discussion of possible cardiac catheterization once the patient is more stable from her respiratory status. Family is to decide on this. I appreciate radiology consultation. Coumadin is on hold due to bleeding in her ear canal. She may benefit from aspirin if she will not take Coumadin at discharge.  2. Acute on chronic systolic congestive heart failure: Echocardiogram shows EF of 30-35% with wall motion abnormality. Cardiology is seeing the patient consultation. Patient is continued on Lasix 20 IV every 12 hours. Due to low blood pressure this morning Lasix was on hold. Continue to monitor I's and O's and daily weight. Further recommendations as per cardiology consultation.   3. Bleeding from right ear with laceration to canal with unknown cause: Patient's Coumadin is on hold. I appreciate ENT consultation. Continue Ciprodex  drops to right ear twice a day for 7 days. Cottonball to ear for drainage. Patient will need outpatient follow-up in 1 week with ENT.  4. CHRONIC atrial fibrillation: Patient's heart rate is controlled. Holding Coumadin due to problem #3. Continue metoprolol.  5. Vomiting: Patient's vomiting is improved. Continue PPI.  6. Petechia to the face after a vomiting episode. Stop Coumadin for now.  7. Weakness: PT consultation has been placed as well as social work consult. Patient does live at independent living at this time.      Management plans discussed with the patient and daughter and they are in agreement.[name]  CODE STATUS: DO NOT RESUSCITATE  TOTAL TIME TAKING CARE OF THIS PATIENT: 30 minutes.   Greater than 50% counseling and coordination of care  POSSIBLE D/C  2-3 days DEPENDING ON CLINICAL CONDITION.   Nelson Noone M.D on 04/09/2015 at 11:47 AM  Between 7am to 6pm - Pager - [phone] After 6pm go to [email or web address] - password [email or web address] [facility]  [facility]  Office  [phone]  CC: Primary care physician; Loura Pardon, MD

## 2015-04-09 NOTE — Evaluation (Signed)
Physical Therapy Evaluation Patient Details Name: [name] MRN: [id number] DOB: Feb 08, 1922 Today's Date: 04/09/2015   History of Present Illness  Pt is a 81 y.o. female presenting to hospital with chest pain, SOB, and emesis.  Pt admitted to hospital with unstable angina and acute on chronic CHF.  Pt noted to have blood in L ear canal (ENT consult).  CT of head negative for acute intracranial abnormality.  Chest x-ray shows interstital edema and small B pleural effusions.  Clinical Impression  Currently pt demonstrates impairments with balance, activity tolerance, and limitations with functional mobility.  Prior to admission, pt was independent with rollator and lives at [facility].  Currently pt is CGA with transfers and short distances of ambulation with RW (limited d/t fatigue).  Pt would benefit from skilled PT to address above noted impairments and functional limitations.  Pt's daughter reports plan to take pt to her home upon discharge and is available to assist 24/7 if needed initially.  Recommend pt discharge to pt's daughter's home with assist for all functional mobility for safety when medically appropriate.     Follow Up Recommendations Home health PT; assist for all functional mobility.    Equipment Recommendations       Recommendations for Other Services       Precautions / Restrictions Precautions Precautions: Fall Restrictions Weight Bearing Restrictions: No      Mobility  Bed Mobility Overal bed mobility: Needs Assistance Bed Mobility: Supine to Sit;Sit to Supine     Supine to sit: Supervision Sit to supine: Supervision   General bed mobility comments: assist for lines  Transfers Overall transfer level: Needs assistance Equipment used: Rolling walker (2 wheeled) Transfers: Sit to/from [facility] Sit to Stand: Min guard Stand pivot transfers: Min guard (transfer to commode over toilet in bathroom)       General  transfer comment: steady without loss of balance  Ambulation/Gait Ambulation/Gait assistance: Min guard Ambulation Distance (Feet):  (15 feet x2 to bathroom and back to bed; x30 feet) Assistive device: Rolling walker (2 wheeled) Gait Pattern/deviations: Step-through pattern     General Gait Details: decreased cadence but no loss of balance noted; limited distance d/t fatigue  Stairs            Wheelchair Mobility    Modified Rankin (Stroke Patients Only)       Balance Overall balance assessment: Needs assistance Sitting-balance support: Feet supported Sitting balance-Leahy Scale: Good     Standing balance support: During functional activity Standing balance-Leahy Scale: Good Standing balance comment: pt able to manage underwear in standing (without UE support) for toileting without loss of balance                             Pertinent Vitals/Pain Pain Assessment: No/denies pain  Pt's O2 decreased from 91% at rest to 89% with activity on 2 L/min via nasal cannula (increased back to 92% with rest on 2 L/min via nasal cannula).  Pt's HR 97-101 bpm during session.    Home Living Family/patient expects to be discharged to:: Private residence (Pt lives at [place]) [place]: [place] (Pt plans to discharge to her daughter's home) Available Help at Discharge: Family Type of Home: House (Pt's daughter's home) Home Access: Level entry (Pt's daughter's home)     Home Layout: One level (Pt's daughter's home) Home Equipment: [place] - 4 wheels;Toilet riser      Prior  Function Level of Independence: Independent with assistive device(s)         Comments: Uses rollator      Hand Dominance        Extremity/Trunk Assessment   Upper Extremity Assessment: Overall WFL for tasks assessed           Lower Extremity Assessment: Overall WFL for tasks assessed         Communication   Communication: HOH  Cognition  Arousal/Alertness: Awake/alert Behavior During Therapy: WFL for tasks assessed/performed Overall Cognitive Status: Within Functional Limits for tasks assessed                      General Comments   Nursing cleared pt for participation in physical therapy.  Pt agreeable to PT session.  Pt's daughter present entire session.    Exercises  Treatment:  Performed semi-supine B LE therapeutic exercise x 10 reps:  Ankle pumps (AROM B LE's); quad sets x3 second holds (AROM B LE's); glute squeezes x3 second holds (AROM B); SAQ's (AROM R; AROM L); heelslides (AROM R; AROM L), hip abd/adduction (AROM R; AROM L), and SLR (AROM R; AROM L).  Pt required vc's and tactile cues for correct technique with exercises.       Assessment/Plan    PT Assessment Patient needs continued PT services  PT Diagnosis Difficulty walking   PT Problem List Decreased strength;Decreased activity tolerance;Decreased balance;Decreased mobility  PT Treatment Interventions DME instruction;Gait training;Functional mobility training;Therapeutic activities;Therapeutic exercise;Balance training;Patient/family education   PT Goals (Current goals can be found in the Care Plan section) Acute Rehab PT Goals Patient Stated Goal: To ambulate more PT Goal Formulation: With patient Time For Goal Achievement: 04/23/15 Potential to Achieve Goals: Good    Frequency Min 2X/week   Barriers to discharge        Co-evaluation               End of Session Equipment Utilized During Treatment: Gait belt;Oxygen (2 L/min via nasal cannula) Activity Tolerance: Patient tolerated treatment well (Limited ambulation distance d/t fatigue) Patient left: in bed;with call bell/phone within reach;with bed alarm set;with family/visitor present           Time: 4734-0370 PT Time Calculation (min) (ACUTE ONLY): 42 min   Charges:   PT Evaluation $Initial PT Evaluation Tier I: 1 Procedure PT Treatments $Therapeutic Exercise: 8-22  mins $Therapeutic Activity: 8-22 mins   PT G CodesLeitha Bleak 08-May-2015, 5:29 PM Leitha Bleak, [place]

## 2015-04-09 NOTE — Progress Notes (Signed)
Notified Dr. Benjie Karvonen that lisinopril, metoprolol, and lasix were not given due to BP being 90/49. MD acknowledged.

## 2015-04-09 NOTE — Progress Notes (Addendum)
Patient: [name] / Admit Date: 04/07/2015 / Date of Encounter: 04/09/2015, 12:15 PM   Subjective: Patient slept well last night on 1 pillow with the head of the bed slightly elevated.  She is currently sleeping off and on during visit. Hypotensive this AM with SBP in the 90s causing HF meds to be held. PLT count remains low at 88 this AM (last known prior nl was 2014). Continued productive cough, some ear discharge, and now says her head feels "off" because of the bleeding and ear drops.  She continues to have a burning feeling in her chest that extends into her neck and shoulder blades. Continued petechiae around her mouth and on both cheeks, but she seems to believe it is somewhat improved since yesterday.  Her daughter also states [name]' eyes have been red, so she has been giving her eyedrops from home.  She denies dizziness or fevers. Patient's daughter is very interested in aggressive treatment.   Review of Systems: Review of Systems  Constitutional: Positive for malaise/fatigue. Negative for fever, chills and diaphoresis.       Sleeping  HENT: Positive for ear discharge and ear pain.        Burning chest pain extends into neck; still has some bleeding/oozing from ear  Eyes: Positive for redness. Negative for blurred vision, photophobia, pain and discharge.  Respiratory: Positive for cough, sputum production and shortness of breath. Negative for wheezing.        Yellow sputum continues  Cardiovascular: Positive for chest pain. Negative for palpitations, orthopnea, leg swelling and PND.       Continues to have burning chest pain that extends to neck and shoulder blades  Gastrointestinal: Positive for heartburn. Negative for nausea, vomiting and abdominal pain.  Musculoskeletal: Positive for back pain and joint pain. Negative for falls.       Chronic back pain and osteoporosis  Skin: Positive for rash.       Redness and petechiae on face - around mouth and on cheeks    Neurological: Positive for weakness. Negative for dizziness, sensory change, seizures and loss of consciousness.       Head feels "off" and "woozie" d/t bleed in ear and new ear drops prescribed by ENT  Endo/Heme/Allergies: Bruises/bleeds easily.  Psychiatric/Behavioral: The patient is not nervous/anxious.   All other systems reviewed and are negative.    Objective: Telemetry: Afib, HR low 80s to low 90s, chronic LBBB Physical Exam: Blood pressure 106/67, pulse 92, temperature 97.8 F (36.6 C), temperature source Oral, resp. rate 18, height 5\' 5"  (1.651 m), weight 129 lb 12.8 oz (58.877 kg), SpO2 96 %. Body mass index is 21.6 kg/(m^2). General: Frail older woman, in no acute distress, sleeping off and on during visit, laying in bed with daughter at bedside Head: Normocephalic, atraumatic, sclera slightly erythematous but non-icteric, no xanthomas, nares are without discharge.  Petechiae around mouth and on cheeks bilaterally. Neck: Negative for carotid bruits. JVP not elevated. Lungs: Decreased breath sounds at bases bilaterally. No wheezes, rales, or rhonchi. Breathing is unlabored. Heart: Irregular-irregular, S1 S2. II/VI murmur at apex, radiates to axilla. No rubs or gallops.  Abdomen: Soft, non-tender, slightly distended with normoactive bowel sounds. No rebound/guarding. Extremities: No clubbing or cyanosis. No edema. Some bruising on lower legs bilaterally.Petichae along face.    Neuro: Alert and oriented X 3. Moves all extremities spontaneously. Psych:  Responds to questions appropriately with a normal affect.   Intake/Output Summary (Last 24 hours) at 04/09/15  [place] filed at 04/09/15 1143  Gross per 24 hour  Intake    723 ml  Output   2300 ml  Net  -1577 ml    Inpatient Medications:  . calcium carbonate  400 mg of elemental calcium Oral QHS  . ciprofloxacin-dexamethasone  4 drop Right Ear BID  . ferrous sulfate  325 mg Oral Daily  . furosemide  20 mg Intravenous  Q12H  . gabapentin  300 mg Oral QHS  . gabapentin  600 mg Oral BID WC  . levothyroxine  88 mcg Oral QAC breakfast  . [START ON 04/10/2015] lisinopril  5 mg Oral QHS  . metoprolol  25 mg Oral BID  . midodrine  5 mg Oral BID WC  . pantoprazole  40 mg Oral BID AC  . potassium chloride  10 mEq Oral Daily  . senna-docusate  1 tablet Oral QHS  . simvastatin  5 mg Oral QPM  . sodium chloride  3 mL Intravenous Q12H  . sodium chloride  3 mL Intravenous Q12H   Infusions:    Labs:  Recent Labs  04/08/15 0406 04/09/15 0329  NA 142 139  K 3.8 3.8  CL 104 100*  CO2 29 30  GLUCOSE 112* 99  BUN 28* 28*  CREATININE 1.45* 1.22*  CALCIUM 8.9 8.5*   No results for input(s): AST, ALT, ALKPHOS, BILITOT, PROT, ALBUMIN in the last 72 hours.  Recent Labs  04/07/15 0910 04/08/15 0406 04/09/15 0329  WBC 8.5 8.4 7.8  NEUTROABS 6.7*  --   --   HGB 12.7 11.7* 11.6*  HCT 39.5 35.5 34.9*  MCV 97.7 96.3 96.2  PLT 93* 87* 88*    Recent Labs  04/07/15 0910 04/07/15 1216 04/07/15 1707 04/08/15 0016  TROPONINI 0.03 0.03 0.03 0.04*   Invalid input(s): POCBNP  Recent Labs  04/08/15 0016  HGBA1C 5.3     Weights: Filed Weights   04/07/15 0855 04/08/15 0354 04/09/15 0444  Weight: 141 lb (63.957 kg) 130 lb 3.2 oz (59.058 kg) 129 lb 12.8 oz (58.877 kg)     Radiology/Studies:  Ct Head Wo Contrast  04/07/2015   CLINICAL DATA:  Fall. Blood in the RIGHT ear canal. Initial encounter. hemotympanum  EXAM: CT HEAD WITHOUT CONTRAST  TECHNIQUE: Contiguous axial images were obtained from the base of the skull through the vertex without intravenous contrast.  COMPARISON:  None.  FINDINGS: No mass lesion, mass effect, midline shift, hydrocephalus, hemorrhage. No acute territorial cortical ischemia/infarct. Atrophy and chronic ischemic white matter disease is present. Benign basal ganglia calcifications are present. Motion artifact was present on the initial scan. Scan was repeated with improvement.   Soft tissue density is present in the RIGHT external auditory canal. There is no basilar skull fracture. The mastoid air cells are clear. Bilateral lens extractions. Calvarium intact.  IMPRESSION: 1. Atrophy and chronic ischemic white matter disease without acute intracranial abnormality. 2. Soft tissue density in the RIGHT external auditory canal is nonspecific by CT. This appears external to the middle ear. Correlate with physical exam. No basilar skull fracture in this patient with reported hemotympanum.   Electronically Signed   By: Dereck Ligas M.D.   On: 04/07/2015 15:51   Dg Chest Portable 1 View  04/07/2015   CLINICAL DATA:  Chest pain and shortness of breath.  EXAM: PORTABLE CHEST - 1 VIEW  COMPARISON:  None.  FINDINGS: There is cardiomegaly with small bilateral pleural effusions. Slight distention of the azygos vein. Interstitial markings are  slightly accentuated with what appear to be Kerley B-lines at the bases.  No acute osseous abnormality. Old compression fracture in the lower thoracic spine treated with vertebroplasty.  IMPRESSION: Cardiomegaly with slight interstitial edema and small bilateral pleural effusions.   Electronically Signed   By: Lorriane Shire M.D.   On: 04/07/2015 09:51     Assessment and Plan  81 y.o. female with h/o coronary artery disease, atrial fibrillation on Coumadin monitored regularly by Dr. Ron Parker, chronic combined systolic and diastolic CHF, mitral regurgitation, tricuspid regurgitation, prolonged QT, LBBB, carotid disease, venous insufficiency, HTN, thrombocytopenia, and calorie malnutrition presented to the ED on 04/07/15 with complaints of nausea, bloody emesis, burning chest pain that extended into the neck, shortness of breath, and worsening weakness over the last 3 weeks. She has also had a productive cough for several days.     1. Chest pain/SOB with echo showing signs of ischemic event, timing unknown given negative blood work:  - constant burning chest  pain that radiates to the neck and shoulder blades for 48 hours - troponins are flat trending, negative x3 (0.03--> 0.03 --> 0.04) -last stress test on 05/24/12 showed LV EF 57%; LV Wall Motion: septal dyssynergy related to the patient's underlying LBBB -echo from 6/37/85 showed systolic function moderately to severely reduced, EF 30% to 35%, HK of the anterior and anteroseptal myocardium, concern for anterior wall hypokinesis/LAD disease. Moderate MR. -high right-sided pressures, greater than 50 mmHg, possibly from depressed LV function (prior echo 02/28/2013 with EF 50-55%).    -no indication for aggressive ischemia workup at this time given cardiac enzymes are negative, new onset thrombocytopenia, and hypotension with possible need for ionotrope -less likely Lexiscan Myoview as outpatient at this time given current objective data unless her clinical picture improves (for example thrombocytopenia and hypotension with possible need for ionotrope). Should these improve and she return back to baseline then could pursue ischemic eval, otherwise would management medically. This was explained to family, they understand, and agree with plan.   2. Acute combined systolic and diastolic heart failure/ICM:  -chest x-ray showing interstitial edema and small bilateral pleural effusions -received injection of 40mg  Lasix on 7/25 and then injections of 20 mg Lasix are ordered daily, restricted I's and O's ordered per IM along with daily weights.  -down 12 lbs. since admission (141 lb 7/25 --> 129 lb 7/27) and 1577 mL -2D Echo from 04/07/15 as above (EF decreased from normal EF on last Echo 02/28/13), BNP from 04/07/15: 779 -gentle diuresis with Lasix held d/t soft BP on 7/27 (90/49) -Consider addition of dopamine, though would require transfer to unit. This could allow for the slow return of some of her heart failure medications and further diuresis  -She does show signs of acute renal injury on 7/25 and 7/26, now  returning back to baseline. This was likely 2/2 diuresis and less likely due to cardiorenal syndrome given the improvement   3. Chronic atrial fib: -metoprolol held d/t soft BP on 7/27 -Coumadin has been held since admission given low plt count, restart when able -CHADSVASc at least 6, (CHF, HTN, age x 2, vascular disease, female) giving the patient an estimated annual yearly risk of stroke at 9.8%  4. HTN -hypotensive this morning into the 88F systolic, requiring lisinopril, metoprolol, and lasix to be held -As above  5. Hyperlipidemia -continue statin  6. Vomiting, possible hemoptysis/hematemesis:  -likely from projectile vomiting and retching, started on IV Protonix BID per IM on 7/25  7. Petechiae and  External ear canal bleeding: -Uncertain etiology -Explained to family pharmacology of Coumadin  -Consider hem/onc consult   -monitered by IM -ENT on board -coumadin held during this admission, head CT is negative  8. Thrombocytopenia and erythrocytopenia  - monitored by IM, onc consult being considered per IM on 7/26   SignedChristell Faith, PA-C Pager: [phone] 04/09/2015, 12:15 PM  I have seen, examined and evaluated the patient this PM along with Mr. Purcell Mouton.  After reviewing all the available data and chart,  I agree with his findings, examination as well as impression recommendations.  For a pleasant elderly woman who appears to be progressively weakening. She does have new anterior wall motion modalities on echo with reduced function. I also agree that at this current time and aggressive ischemic evaluation is not necessarily warranted in the absence of positive enzymes. She does have heart failure symptoms which are being treated. Unfortunately we are limited by hypotension today. I think the best option will be to hold off on her antihypertensives medications including diuretic for today to allow her blood pressure normalized. We then can debate whether not she is  euvolemic to the point of being up to switch from IV to oral Lasix. We can then gradually titrated back on her CHF medications.  If the plan for the family is to proceed with aggressive management plan, we can consider outpatient stress testing when she is more stable.  I'm in agreement with Dr. Rockey Situ reference medical management.   Leonie Man, M.D., M.S. Interventional Cardiologist   Pager # [phone]

## 2015-04-09 NOTE — Progress Notes (Signed)
Patient has no acute event overnight. She was A&O X4 remained on acute 3L of supplemental oxygen. She denined pain on being in any discomfort. Ear drop was administered per order, patient ear does not have new blood drainage at this time. Patient still have some red patches on her face, but patient and family stated that her face looks better compared to admission. Patient VS remained WDL for patient and will continue to monitor

## 2015-04-09 NOTE — Care Management Important Message (Signed)
Important Message  Patient Details  Name: [name] MRN: [id number] Date of Birth: [birth year]   Medicare Important Message Given:  Yes-second notification given    Juliann Pulse A Allmond 04/09/2015, 11:07 AM

## 2015-04-10 ENCOUNTER — Ambulatory Visit: Payer: Commercial Managed Care - HMO

## 2015-04-10 ENCOUNTER — Telehealth: Payer: Self-pay | Admitting: Family Medicine

## 2015-04-10 DIAGNOSIS — R079 Chest pain, unspecified: Secondary | ICD-10-CM | POA: Insufficient documentation

## 2015-04-10 LAB — BASIC METABOLIC PANEL
Anion gap: 7 (ref 5–15)
BUN: 26 mg/dL — ABNORMAL HIGH (ref 6–20)
CHLORIDE: 100 mmol/L — AB (ref 101–111)
CO2: 29 mmol/L (ref 22–32)
Calcium: 8.3 mg/dL — ABNORMAL LOW (ref 8.9–10.3)
Creatinine, Ser: 1.1 mg/dL — ABNORMAL HIGH (ref 0.44–1.00)
GFR calc non Af Amer: 43 mL/min — ABNORMAL LOW (ref >60)
GFR, EST AFRICAN AMERICAN: 49 mL/min — AB (ref >60)
GLUCOSE: 101 mg/dL — AB (ref 65–99)
Potassium: 3.9 mmol/L (ref 3.5–5.1)
Sodium: 136 mmol/L (ref 135–145)

## 2015-04-10 LAB — CBC
HCT: 35.5 % (ref 35.0–47.0)
HEMOGLOBIN: 11.7 g/dL — AB (ref 12.0–16.0)
MCH: 31.7 pg (ref 26.0–34.0)
MCHC: 33 g/dL (ref 32.0–36.0)
MCV: 96.1 fL (ref 80.0–100.0)
PLATELETS: 109 10*3/uL — AB (ref 150–440)
RBC: 3.69 MIL/uL — AB (ref 3.80–5.20)
RDW: 15.8 % — AB (ref 11.5–14.5)
WBC: 8.6 10*3/uL (ref 3.6–11.0)

## 2015-04-10 LAB — PROTIME-INR
INR: 1.47
PROTHROMBIN TIME: 18 s — AB (ref 11.4–15.0)

## 2015-04-10 MED ORDER — SODIUM CHLORIDE 0.9 % IJ SOLN: 3.0000 mL | INTRAMUSCULAR | Status: DC | PRN

## 2015-04-10 MED ORDER — WARFARIN SODIUM 5 MG PO TABS
5.0000 mg | ORAL_TABLET | Freq: Every day | ORAL | Status: DC
  Administered 2015-04-11 – 2015-04-12 (×2): 5 mg via ORAL
  Filled 2015-04-10 (×2): qty 1

## 2015-04-10 MED ORDER — WARFARIN SODIUM 5 MG PO TABS
5.0000 mg | ORAL_TABLET | Freq: Once | ORAL | Status: AC
  Administered 2015-04-10: 5 mg via ORAL
  Filled 2015-04-10: qty 1

## 2015-04-10 MED ORDER — METOPROLOL TARTRATE 25 MG PO TABS
12.5000 mg | ORAL_TABLET | Freq: Two times a day (BID) | ORAL | Status: DC
  Administered 2015-04-10 – 2015-04-12 (×4): 12.5 mg via ORAL
  Filled 2015-04-10 (×4): qty 1

## 2015-04-10 MED ORDER — WARFARIN - PHARMACIST DOSING INPATIENT
Freq: Every day | Status: DC
  Administered 2015-04-11: 18:00:00

## 2015-04-10 NOTE — Telephone Encounter (Signed)
Pt is in hospital, with a heart blockage, low platelets and fluid around the heart. They are holding her coumadin, do you want her to keep the appt for coumadin check on Aug 1?  Callback number for daughter [phone].  Dr Dorann Ou is aware of what is going on as well

## 2015-04-10 NOTE — Progress Notes (Signed)
Patient: [name] / Admit Date: 04/07/2015 / Date of Encounter: 04/10/2015, 10:41 AM   Subjective: Patient slept well last night on 1 pillow with the head of the bed slightly elevated. She is currently sleeping off and on during visit. She states that going to the restroom with assistance wore her out.  Per her daughter who is present at bedside, patient is relatively independent, moderately well functioning until prior to this admission. She received a dose of Lasix last night, but her BP was too low this morning to receive another dose.  She still feels like there is distention in her abdomen and put on compression socks to help with swelling in her legs. She has continued productive cough with yellow phlegm. She no longer has ear discharge, but still says her head feels "off" as a result of previous bleeding and she is able to hear her heart beat when she chews. She continues to have a burning feeling in her chest that extends into her neck and shoulder blades. Continued petechiae around her mouth and on both cheeks, but she seems to believe it is somewhat improved since admission. She denies dizziness, but says that the trip to the bathroom this morning caused her SOB.   Review of Systems: Review of Systems  Constitutional: Positive for malaise/fatigue. Negative for fever, chills, weight loss and diaphoresis.  HENT: Positive for ear discharge and ear pain.        Only has clear discharge now from ears after applying ear drops  Eyes: Negative for pain, discharge and redness.       Eye redness has improved with eyedrops from home  Respiratory: Positive for cough, sputum production and shortness of breath. Negative for wheezing.        Yellow phlegm; SOB when she walked to the bathroom with assistance  Cardiovascular: Positive for chest pain and leg swelling. Negative for palpitations, orthopnea and PND.       Continues to have a burning feeling in her chest that extends into her  neck and shoulder blades; wearing compression socks to help with leg swelling  Gastrointestinal: Negative for nausea, vomiting, abdominal pain, diarrhea and constipation.       Abdominal distention  Musculoskeletal: Positive for back pain and neck pain.       Continues to have a burning feeling in her chest that extends into her neck and shoulder blades  Skin: Positive for rash.       Continued petechiae and redness around mouth and on cheeks (left worse than right)  Neurological: Positive for weakness. Negative for dizziness, loss of consciousness and headaches.  Psychiatric/Behavioral: The patient is not nervous/anxious.   All other systems reviewed and are negative.  All other systems reviewed and negative.   Objective: Telemetry: A fib, HR mid 80's to mid 90's this AM, HR overnight up to 170's Physical Exam: Blood pressure 91/47, pulse 92, temperature 98.9 F (37.2 C), temperature source Oral, resp. rate 17, height 5\' 5"  (1.651 m), weight 130 lb (58.968 kg), SpO2 95 %. Body mass index is 21.63 kg/(m^2). General: Frail older woman, in no acute distress, sleeping off and on during visit, laying in bed with daughter at bedside, appears to be progressively weakening Head: Normocephalic, atraumatic, sclera non-erythematous and non-icteric, no xanthomas, nares are without discharge. Petechiae around mouth and on cheeks bilaterally, R>L. Neck: Negative for carotid bruits. JVP not elevated. Lungs: Decreased breath sounds at bases bilaterally. No wheezes, rales, or rhonchi. Breathing is unlabored.  Heart: Irregular-irregular, S1 S2. II/VI murmur at apex, radiates to axilla. No rubs or gallops.  Abdomen: Soft, non-tender, slightly distended with normoactive bowel sounds. No rebound/guarding. Extremities: No clubbing or cyanosis. No edema, wearing compression socks. Some bruising on lower legs bilaterally present since admission.  Neuro: Alert and oriented X 3. Moves all extremities  spontaneously. Psych: Responds to questions appropriately with a normal affect.  Intake/Output Summary (Last 24 hours) at 04/10/15 1041 Last data filed at 04/10/15 0948  Gross per 24 hour  Intake    480 ml  Output    850 ml  Net   -370 ml    Inpatient Medications:  . calcium carbonate  400 mg of elemental calcium Oral QHS  . ciprofloxacin-dexamethasone  4 drop Right Ear BID  . ferrous sulfate  325 mg Oral Daily  . furosemide  20 mg Intravenous Q12H  . gabapentin  300 mg Oral QHS  . gabapentin  600 mg Oral BID WC  . levothyroxine  88 mcg Oral QAC breakfast  . lisinopril  5 mg Oral QHS  . metoprolol  25 mg Oral BID  . midodrine  5 mg Oral BID WC  . pantoprazole  40 mg Oral BID AC  . potassium chloride  10 mEq Oral Daily  . senna-docusate  1 tablet Oral QHS  . simvastatin  5 mg Oral QPM  . sodium chloride  3 mL Intravenous Q12H  . sodium chloride  3 mL Intravenous Q12H   Infusions:    Labs:  Recent Labs  04/09/15 0329 04/10/15 0333  NA 139 136  K 3.8 3.9  CL 100* 100*  CO2 30 29  GLUCOSE 99 101*  BUN 28* 26*  CREATININE 1.22* 1.10*  CALCIUM 8.5* 8.3*   No results for input(s): AST, ALT, ALKPHOS, BILITOT, PROT, ALBUMIN in the last 72 hours.  Recent Labs  04/09/15 0329 04/10/15 0333  WBC 7.8 8.6  HGB 11.6* 11.7*  HCT 34.9* 35.5  MCV 96.2 96.1  PLT 88* 109*    Recent Labs  04/07/15 1216 04/07/15 1707 04/08/15 0016  TROPONINI 0.03 0.03 0.04*   Invalid input(s): POCBNP  Recent Labs  04/08/15 0016  HGBA1C 5.3     Weights: Filed Weights   04/08/15 0354 04/09/15 0444 04/10/15 0351  Weight: 130 lb 3.2 oz (59.058 kg) 129 lb 12.8 oz (58.877 kg) 130 lb (58.968 kg)     Radiology/Studies:  Ct Head Wo Contrast  04/07/2015   CLINICAL DATA:  Fall. Blood in the RIGHT ear canal. Initial encounter. hemotympanum  EXAM: CT HEAD WITHOUT CONTRAST  TECHNIQUE: Contiguous axial images were obtained from the base of the skull through the vertex without intravenous  contrast.  COMPARISON:  None.  FINDINGS: No mass lesion, mass effect, midline shift, hydrocephalus, hemorrhage. No acute territorial cortical ischemia/infarct. Atrophy and chronic ischemic white matter disease is present. Benign basal ganglia calcifications are present. Motion artifact was present on the initial scan. Scan was repeated with improvement.  Soft tissue density is present in the RIGHT external auditory canal. There is no basilar skull fracture. The mastoid air cells are clear. Bilateral lens extractions. Calvarium intact.  IMPRESSION: 1. Atrophy and chronic ischemic white matter disease without acute intracranial abnormality. 2. Soft tissue density in the RIGHT external auditory canal is nonspecific by CT. This appears external to the middle ear. Correlate with physical exam. No basilar skull fracture in this patient with reported hemotympanum.   Electronically Signed   By: Arvilla Market.D.  On: 04/07/2015 15:51   Dg Chest Portable 1 View  04/07/2015   CLINICAL DATA:  Chest pain and shortness of breath.  EXAM: PORTABLE CHEST - 1 VIEW  COMPARISON:  None.  FINDINGS: There is cardiomegaly with small bilateral pleural effusions. Slight distention of the azygos vein. Interstitial markings are slightly accentuated with what appear to be Kerley B-lines at the bases.  No acute osseous abnormality. Old compression fracture in the lower thoracic spine treated with vertebroplasty.  IMPRESSION: Cardiomegaly with slight interstitial edema and small bilateral pleural effusions.   Electronically Signed   By: Lorriane Shire M.D.   On: 04/07/2015 09:51     Assessment and Plan  81 y.o. female with h/o coronary artery disease, atrial fibrillation on Coumadin monitored regularly by Dr. Ron Parker, chronic combined systolic and diastolic CHF, mitral regurgitation, tricuspid regurgitation, prolonged QT, LBBB, carotid disease, venous insufficiency, HTN, thrombocytopenia, and calorie malnutrition presented to the ED on  04/07/15 with complaints of nausea, bloody emesis, burning chest pain that extended into the neck, shortness of breath, and worsening weakness over the last 3 weeks. She has also had a productive cough for several days.  1. Chest pain/SOB with echo showing signs of ischemic event, timing unknown given negative blood work:  - constant burning chest pain that radiates to the neck and shoulder blades for 48 hours - troponins are flat trending, negative x3 (0.03--> 0.03 --> 0.04) -last stress test on 05/24/12 showed LV EF 57%; LV Wall Motion: septal dyssynergy related to the patient's underlying LBBB -echo from 7/56/43 showed systolic function moderately to severely reduced, EF 30% to 35%, HK of the anterior and anteroseptal myocardium, concern for anterior wall hypokinesis/LAD disease. Moderate MR. -high right-sided pressures, greater than 50 mmHg, possibly from depressed LV function (prior echo 02/28/2013 with EF 50-55%).  ------no indication for aggressive ischemia workup at this time given cardiac enzymes are negative, new onset thrombocytopenia -----continue with supportive medical management -------when she returns to her baseline, could consider Lexiscan Myoview in the outpatient setting  2. Acute combined systolic and diastolic heart failure/ICM:  -chest x-ray showing interstitial edema and small bilateral pleural effusions -received injection of 40mg  Lasix on 7/25 and then injections of 20 mg Lasix are ordered daily, restricted I's and O's ordered per IM along with daily weights.  -down 2567 mL since admission -2D Echo from 04/07/15 as above (EF decreased from normal EF on last Echo 02/28/13), BNP from 04/07/15: 779 -gentle diuresis with Lasix held d/t soft BP on 7/27 (90/49)  3. Chronic atrial fib: -Coumadin has been held since admission given low plt count, restart when able -CHADSVASc at least 6, (CHF, HTN, age x 2, vascular disease, female) giving the patient an estimated annual  yearly risk of stroke at 9.8%  4. HTN -hypotensive this morning into the 32R systolic, requiring lisinopril, metoprolol, and lasix to be held Hypotension the past 2 days, possibly after she receives gabapentin 600 mg in the a.m. --- She also has doses of gabapentin at noon, and before sleep We'll hold gabapentin for now as she is sleeping all day and all night  We'll discuss with hospitalist  5. Hyperlipidemia -continue statin  6. Vomiting, possible hemoptysis/hematemesis:  -likely from projectile vomiting and retching, started on IV Protonix BID per IM on 7/25 -no longer experiencing N/V, hematemesis; normal bowel movement last evening, able to eat a good breakfast this morning  7. Petechiae and External ear canal bleeding: -Uncertain etiology -Explained to family the pharmacology of Coumadin  -  Consider hem/onc consult  -monitered by IM -ENT on board -coumadin held during this admission, head CT is negative  8. Thrombocytopenia and erythrocytopenia  - monitored by IM, onc consult being considered per IM on 7/26  Caroline E. Howell-Methvin, PA-S   [place]

## 2015-04-10 NOTE — Progress Notes (Addendum)
Patient has not been on her coumadin since admission. Spoke to Dr. Rockey Situ who states she is appropriate to go back on her coumadin. Notified Dr. Benjie Karvonen who is going to restart med.

## 2015-04-10 NOTE — Progress Notes (Signed)
Oriented, patient has been a little more alert today. Arousable to voice, but sleeps between care. Afib on tele in the 90's to 100's. Patient started back on coumadin today. No complaints of pain. Blood pressure was low again this morning, held lasix and metoprolol. Cardiology is following. Will continue to monitor.

## 2015-04-10 NOTE — Clinical Social Work Note (Signed)
PT is recommending HHPT.  No CSW needs at this time.  CSW signing off.  Please reconsult should further needs arise.  [place], Lyndon

## 2015-04-10 NOTE — Telephone Encounter (Signed)
Daughter notified we can reschedule appt after pt is d/c from hospital, and advise of Dr. Marliss Coots comments.   Appts. Cancelled

## 2015-04-10 NOTE — Telephone Encounter (Signed)
It depends when she gets out of the hospital /when she re starts coumadin and we do not know that yet - we will have to see what her discharge summary says  If they want to cancel for now that is fine

## 2015-04-10 NOTE — Progress Notes (Signed)
   04/10/15 1045  Clinical Encounter Type  Visited With Patient and family together  Visit Type Initial  Referral From Chaplain  Consult/Referral To Chaplain  Spiritual Encounters  Spiritual Needs Prayer;Emotional;Grief support  Stress Factors  Patient Stress Factors Exhausted;Health changes;Major life changes  Family Stress Factors Family relationships;Health changes;Major life changes  Met w/patient & family. Family requested company & prayer for wisdom on decisions. Provided spiritual guidance & prayer. Chap. Latajah Thuman G. [place]

## 2015-04-10 NOTE — Progress Notes (Signed)
ANTICOAGULATION CONSULT NOTE - Initial Consult  Pharmacy Consult for Warfarin Indication: atrial fibrillation  Allergies  Allergen Reactions  . Sulfonamide Derivatives Nausea And Vomiting    REACTION: sick  . Alendronate Sodium Other (See Comments)    REACTION: GI    Patient Measurements: Height: 5\' 5"  (165.1 cm) Weight: 130 lb (58.968 kg) IBW/kg (Calculated) : 57   Vital Signs: Temp: 97.9 F (36.6 C) (07/28 1122) Temp Source: Oral (07/28 0351) BP: 119/68 mmHg (07/28 1122) Pulse Rate: 87 (07/28 1122)  Labs:  Recent Labs  04/07/15 1707 04/08/15 0016  04/08/15 0406 04/09/15 0329 04/10/15 0333 04/10/15 1311  HGB  --   --   < > 11.7* 11.6* 11.7*  --   HCT  --   --   --  35.5 34.9* 35.5  --   PLT  --   --   --  87* 88* 109*  --   APTT  --   --   --  45*  --   --   --   LABPROT  --   --   --  27.7*  --   --  18.0*  INR  --   --   --  2.57  --   --  1.47  CREATININE  --   --   --  1.45* 1.22* 1.10*  --   TROPONINI 0.03 0.04*  --   --   --   --   --   < > = values in this interval not displayed.  Estimated Creatinine Clearance: 30 mL/min (by C-Branch formula based on Cr of 1.1).   Medical History: Past Medical History  Diagnosis Date  . Chronic atrial fibrillation     a. on Coumadin; b. Holter 06/2010, no brady, mild tachy  . CAD (coronary artery disease)     a. 2 DES, [facility], 2004; b. nuc 2013 septal dyssenergy 2/2 LBBB, nl LV fxn  . Pleural effusion associated with pulmonary infection 03-2007  . Hypothyroid   . Hyperlipidemia   . GERD (gastroesophageal reflux disease)   . Diverticulosis of colon   . Allergy   . Anemia   . Arthritis     osteo  . Hypertension   . COPD (chronic obstructive pulmonary disease)   . Mitral regurgitation   . Vitamin B deficiency   . Compression fracture of lumbar vertebra 05-2008  . Osteoporosis   . Thrombocytopenia   . SOB (shortness of breath)   . OA (osteoarthritis)   . LBBB (left bundle branch block)   . Hx of colonoscopy   .  Warfarin anticoagulation   . TR (tricuspid regurgitation)     moderate, echo 2008  . Dyslipidemia   . Lung abnormality     Dr [name] 122/2011 no further w/u needed  . Prolonged QT interval     when on sotolol in past  . Volume overload     intermittant  . Hypertension   . Weight loss     August, 2012  . Nausea     Some nausea with medications, May, 2013,  . Orthostatic hypotension     June, 2014  . Shingles     Medications:  Scheduled:  . calcium carbonate  400 mg of elemental calcium Oral QHS  . ciprofloxacin-dexamethasone  4 drop Right Ear BID  . ferrous sulfate  325 mg Oral Daily  . furosemide  20 mg Intravenous Q12H  . levothyroxine  88 mcg Oral QAC breakfast  . metoprolol  12.5 mg Oral BID  . midodrine  5 mg Oral BID WC  . pantoprazole  40 mg Oral BID AC  . potassium chloride  10 mEq Oral Daily  . senna-docusate  1 tablet Oral QHS  . simvastatin  5 mg Oral QPM  . sodium chloride  3 mL Intravenous Q12H  . sodium chloride  3 mL Intravenous Q12H  . warfarin  5 mg Oral Once  . [START ON 04/11/2015] warfarin  5 mg Oral Daily  . Warfarin - Pharmacist Dosing Inpatient   Does not apply q1800    Assessment: Patient is a 81 yo female with atrial fibrillation.  Patient takes warfarin as an outpatient for chronic anticoagulation.  Per notes, patient takes warfarin 5 mg po daily except for 7.5 mg po on Wed and Sun.    No warfarin given during this admission.  INR today of 1.47, Hgb: 11.7   Goal of Therapy:  INR 2-3 Monitor hemoglobin per policy   Plan:  Will restart patient on warfarin 5 mg po daily.  Will not order the 7.5 mg on Wed/Sun at this time.  Will assess INR daily to help guide dosing. Will recheck INR in AM.   [name] 04/10/2015,1:57 PM

## 2015-04-10 NOTE — Progress Notes (Signed)
[facility] [place] NAME: [name]    MR#:  [id number]  DATE OF BIRTH:  07/21/1922  SUBJECTIVE:  Patient's blood pressure was low again this morning. She ambulated with physical therapy yesterday. She no longer has blood from her ear. REVIEW OF SYSTEMS:    Review of Systems  Constitutional: Positive for malaise/fatigue. Negative for fever and chills.  HENT: Negative for congestion, ear discharge, ear pain and sore throat.   Eyes: Negative for blurred vision.  Respiratory: Negative for cough, hemoptysis, shortness of breath, wheezing and stridor.   Cardiovascular: Negative for chest pain, palpitations and leg swelling.  Gastrointestinal: Negative for nausea, vomiting, abdominal pain, diarrhea and blood in stool.  Genitourinary: Negative for dysuria.  Musculoskeletal: Negative for back pain.  Neurological: Positive for weakness. Negative for dizziness, tremors, sensory change, speech change, focal weakness, seizures, loss of consciousness and headaches.  Endo/Heme/Allergies: Does not bruise/bleed easily.    Tolerating Diet: Yes      DRUG ALLERGIES:   Allergies  Allergen Reactions  . Sulfonamide Derivatives Nausea And Vomiting    REACTION: sick  . Alendronate Sodium Other (See Comments)    REACTION: GI    VITALS:  Blood pressure 119/68, pulse 87, temperature 97.9 F (36.6 C), temperature source Oral, resp. rate 18, height 5\' 5"  (1.651 m), weight 58.968 kg (130 lb), SpO2 98 %.  PHYSICAL EXAMINATION:   Physical Exam  Constitutional: She is oriented to person, place, and time and well-developed, well-nourished, and in no distress. No distress.  HENT:  Head: Normocephalic.  No blood noted in ear canal  Eyes: No scleral icterus.  Neck: Normal range of motion. Neck supple. No JVD present. No tracheal deviation present.  Cardiovascular: Normal rate and regular rhythm.  Exam reveals no gallop and no friction rub.    Murmur heard. Atrial fibrillation  Pulmonary/Chest: Effort normal and breath sounds normal. No respiratory distress. She has no wheezes. She has no rales. She exhibits no tenderness.  Decreased breath sounds throughout  Abdominal: Soft. Bowel sounds are normal. She exhibits no distension and no mass. There is no tenderness. There is no rebound and no guarding.  Musculoskeletal: Normal range of motion. She exhibits no edema.  Neurological: She is alert and oriented to person, place, and time.  Skin: Skin is warm. No rash noted. No erythema.  Petechiae on face  Psychiatric: Affect and judgment normal.      LABORATORY PANEL:   CBC  Recent Labs Lab 04/10/15 0333  WBC 8.6  HGB 11.7*  HCT 35.5  PLT 109*   ------------------------------------------------------------------------------------------------------------------  Chemistries   Recent Labs Lab 04/10/15 0333  NA 136  K 3.9  CL 100*  CO2 29  GLUCOSE 101*  BUN 26*  CREATININE 1.10*  CALCIUM 8.3*   ------------------------------------------------------------------------------------------------------------------  Cardiac Enzymes  Recent Labs Lab 04/07/15 1216 04/07/15 1707 04/08/15 0016  TROPONINI 0.03 0.03 0.04*   ------------------------------------------------------------------------------------------------------------------  RADIOLOGY:  No results found.   ASSESSMENT AND PLAN:   This is a very pleasant 81 year old female with history of CAD, atrial fibrillation on chronic Coumadin who was admitted for unstable angina and acute on chronic CHF.  1. Chest pain: Troponins 3 are relatively negative. Patient has been seen and evaluated by cardiology. Her 2-D echocardiogram showed an ejection fraction of 30-35% with wall motion abnormalities in the anterior anterior septal area.  I appreciate radiology consultation. Coumadin is on hold due to bleeding in her ear canal. She  may benefit from aspirin if she will  not take Coumadin at discharge. Cardiology is considering outpatient stress test for further evaluation.   2. Acute on chronic systolic congestive heart failure: Echocardiogram shows EF of 30-35% with wall motion abnormality. Patient is continued on Lasix 20 IV every 12 hours. Due to low blood pressure this morning Lasix was on hold. Continue to monitor I's and O's and daily weight. Further recommendations as per cardiology consultation. I would like to try lisinopril however due to low blood pressure she is unable to tolerate this. I have decreased metoprolol from 25-12.5 mg by mouth twice a day. This may allow [place] to use Lasix and metoprolol in the a.m.  3. Bleeding from right ear with laceration to canal with unknown cause: Patient's Coumadin is on hold. I appreciate ENT consultation. Continue Ciprodex drops to right ear twice a day for 7 days. Cottonball to ear for drainage. Patient will need outpatient follow-up in 1 week with ENT.  4. CHRONIC atrial fibrillation: Patient's heart rate is controlled. Holding Coumadin due to problem #3. Continue metoprolol. We may consider stopping Coumadin in deciding aspirin at this point. 5. Vomiting: Patient's vomiting is improved. Continue PPI.  6. Petechia to the face after a vomiting episode. Stop Coumadin for now.  7. Weakness: PT consultation has been placed they're recommending home health care.    Management plans discussed with the patient and daughter and they are in agreement.. Plan discussed with Dr. Rockey Situ  CODE STATUS: DO NOT RESUSCITATE  TOTAL TIME TAKING CARE OF THIS PATIENT: 25 minutes.   Greater than 50% counseling and coordination of care  POSSIBLE D/C  2-3 days DEPENDING ON CLINICAL CONDITION.   [name] M.D on 04/10/2015 at 11:34 AM  Between 7am to 6pm - Pager - [phone] After 6pm go to [email or web address] - password [email or web address] [facility]  [facility]  Office  [phone]  CC: Primary care physician; Loura Pardon,  MD

## 2015-04-10 NOTE — Care Management (Signed)
Spoke with patient 's daughter [name].  Patient is a lot weaker today than 2 days ago.  There are no notes from physical therapy for 7/28.  On 7/27, patient ambulated 15 feet x 2.  Today she is having difficulty getting to the bathroom without heavy contact guard assist.  She must be able to ambulate to the dining room upon return to [facility] and in present state, she is not able to do so.  Daughter had originally thought that she would take patient to her  Home for continuous supervision/assist  at discharge and have home health, but at present, in order for patient to regain her strength more quickly to enable her to return to independent living, she would benefit from a more intense rehab treatment plan offered in a skilled nursing facility.  Will report this to CSW.  Will also have physical therapy reassess.  Patient continues to require supplemental 02 which is acute.

## 2015-04-10 NOTE — Telephone Encounter (Signed)
See prev note. Pt is scheduled a coumadin and a f/u appt on that day message routed to [facility] and Dr. Glori Bickers

## 2015-04-11 ENCOUNTER — Encounter
Admission: RE | Admit: 2015-04-11 | Discharge: 2015-04-11 | Disposition: A | Discharge status: Home / Self Care | Payer: Commercial Managed Care - HMO | Source: Ambulatory Visit | Attending: Internal Medicine | Admitting: Internal Medicine

## 2015-04-11 DIAGNOSIS — I482 Chronic atrial fibrillation: Secondary | ICD-10-CM | POA: Insufficient documentation

## 2015-04-11 LAB — BASIC METABOLIC PANEL
ANION GAP: 8 (ref 5–15)
BUN: 24 mg/dL — ABNORMAL HIGH (ref 6–20)
CHLORIDE: 100 mmol/L — AB (ref 101–111)
CO2: 31 mmol/L (ref 22–32)
Calcium: 8.5 mg/dL — ABNORMAL LOW (ref 8.9–10.3)
Creatinine, Ser: 1.16 mg/dL — ABNORMAL HIGH (ref 0.44–1.00)
GFR calc Af Amer: 46 mL/min — ABNORMAL LOW (ref >60)
GFR calc non Af Amer: 40 mL/min — ABNORMAL LOW (ref >60)
Glucose, Bld: 95 mg/dL (ref 65–99)
POTASSIUM: 4.1 mmol/L (ref 3.5–5.1)
SODIUM: 139 mmol/L (ref 135–145)

## 2015-04-11 LAB — PROTIME-INR
INR: 1.45
Prothrombin Time: 17.8 seconds — ABNORMAL HIGH (ref 11.4–15.0)

## 2015-04-11 MED ORDER — NEOMYCIN-POLYMYXIN-HC 1 % OT SOLN: 3.0000 [drp] | Freq: Three times a day (TID) | OTIC | Status: AC

## 2015-04-11 MED ORDER — LISINOPRIL 2.5 MG PO TABS
2.5000 mg | ORAL_TABLET | Freq: Every day | ORAL | Status: DC
Start: 2015-04-11 — End: 2015-05-14

## 2015-04-11 MED ORDER — METOPROLOL TARTRATE 25 MG PO TABS: 12.5000 mg | ORAL_TABLET | Freq: Two times a day (BID) | ORAL | Status: DC

## 2015-04-11 MED ORDER — FUROSEMIDE 40 MG PO TABS: ORAL_TABLET | ORAL | Status: DC

## 2015-04-11 MED ORDER — WARFARIN SODIUM 5 MG PO TABS: 5.0000 mg | ORAL_TABLET | Freq: Every day | ORAL | Status: DC

## 2015-04-11 NOTE — Progress Notes (Signed)
Patient: [name] / Admit Date: 04/07/2015 / Date of Encounter: 04/11/2015, 8:58 AM   Subjective:  She is not feeling well today. She complains of left-sided sharp pain radiating to her back which seems to be musculoskeletal and different from the burning sensation in her chest that she presented with. She also feels foggy in her head and very weak.   Review of Systems: Review of Systems  Constitutional: Positive for malaise/fatigue. Negative for fever, chills, weight loss and diaphoresis.  HENT: Positive for ear discharge and ear pain.        Only has clear discharge now from ears after applying ear drops  Eyes: Negative for pain, discharge and redness.       Eye redness has improved with eyedrops from home  Respiratory: Positive for cough, sputum production and shortness of breath. Negative for wheezing.        Yellow phlegm; SOB when she walked to the bathroom with assistance  Cardiovascular: Positive for chest pain and leg swelling. Negative for palpitations, orthopnea and PND.       Continues to have a burning feeling in her chest that extends into her neck and shoulder blades; wearing compression socks to help with leg swelling  Gastrointestinal: Negative for nausea, vomiting, abdominal pain, diarrhea and constipation.       Abdominal distention  Musculoskeletal: Positive for back pain and neck pain.       Continues to have a burning feeling in her chest that extends into her neck and shoulder blades  Skin: Positive for rash.       Continued petechiae and redness around mouth and on cheeks (left worse than right)  Neurological: Positive for weakness. Negative for dizziness, loss of consciousness and headaches.  Psychiatric/Behavioral: The patient is not nervous/anxious.   All other systems reviewed and are negative.  All other systems reviewed and negative.   Objective: Telemetry: A fib, HR mid 80's to mid 90's this AM, HR overnight up to 170's Physical Exam: Blood  pressure 128/77, pulse 92, temperature 97.6 F (36.4 C), temperature source Oral, resp. rate 24, height 5\' 5"  (1.651 m), weight 129 lb 14.4 oz (58.922 kg), SpO2 98 %. Body mass index is 21.62 kg/(m^2). General: Frail older woman, in no acute distress, sleeping off and on during visit, laying in bed with daughter at bedside, appears to be progressively weakening Head: Normocephalic, atraumatic, sclera non-erythematous and non-icteric, no xanthomas, nares are without discharge. Petechiae around mouth and on cheeks bilaterally, R>L. Neck: Negative for carotid bruits. JVP not elevated. Lungs: Decreased breath sounds at bases bilaterally. No wheezes, rales, or rhonchi. Breathing is unlabored. Heart: Irregular-irregular, S1 S2. II/VI murmur at apex, radiates to axilla. No rubs or gallops.  Abdomen: Soft, non-tender, slightly distended with normoactive bowel sounds. No rebound/guarding. Extremities: No clubbing or cyanosis. No edema, wearing compression socks. Some bruising on lower legs bilaterally present since admission.  Neuro: Alert and oriented X 3. Moves all extremities spontaneously. Psych: Responds to questions appropriately with a normal affect.  Intake/Output Summary (Last 24 hours) at 04/11/15 0858 Last data filed at 04/11/15 0700  Gross per 24 hour  Intake    240 ml  Output    800 ml  Net   -560 ml    Inpatient Medications:  . calcium carbonate  400 mg of elemental calcium Oral QHS  . ciprofloxacin-dexamethasone  4 drop Right Ear BID  . ferrous sulfate  325 mg Oral Daily  . furosemide  20 mg  Intravenous Q12H  . levothyroxine  88 mcg Oral QAC breakfast  . metoprolol  12.5 mg Oral BID  . midodrine  5 mg Oral BID WC  . pantoprazole  40 mg Oral BID AC  . potassium chloride  10 mEq Oral Daily  . senna-docusate  1 tablet Oral QHS  . simvastatin  5 mg Oral QPM  . warfarin  5 mg Oral Daily  . Warfarin - Pharmacist Dosing Inpatient   Does not apply q1800   Infusions:     Labs:  Recent Labs  04/10/15 0333 04/11/15 0408  NA 136 139  K 3.9 4.1  CL 100* 100*  CO2 29 31  GLUCOSE 101* 95  BUN 26* 24*  CREATININE 1.10* 1.16*  CALCIUM 8.3* 8.5*   No results for input(s): AST, ALT, ALKPHOS, BILITOT, PROT, ALBUMIN in the last 72 hours.  Recent Labs  04/09/15 0329 04/10/15 0333  WBC 7.8 8.6  HGB 11.6* 11.7*  HCT 34.9* 35.5  MCV 96.2 96.1  PLT 88* 109*   No results for input(s): CKTOTAL, CKMB, TROPONINI in the last 72 hours. Invalid input(s): POCBNP No results for input(s): HGBA1C in the last 72 hours.   Weights: Filed Weights   04/09/15 0444 04/10/15 0351 04/11/15 0526  Weight: 129 lb 12.8 oz (58.877 kg) 130 lb (58.968 kg) 129 lb 14.4 oz (58.922 kg)     Radiology/Studies:  Ct Head Wo Contrast  04/07/2015   CLINICAL DATA:  Fall. Blood in the RIGHT ear canal. Initial encounter. hemotympanum  EXAM: CT HEAD WITHOUT CONTRAST  TECHNIQUE: Contiguous axial images were obtained from the base of the skull through the vertex without intravenous contrast.  COMPARISON:  None.  FINDINGS: No mass lesion, mass effect, midline shift, hydrocephalus, hemorrhage. No acute territorial cortical ischemia/infarct. Atrophy and chronic ischemic white matter disease is present. Benign basal ganglia calcifications are present. Motion artifact was present on the initial scan. Scan was repeated with improvement.  Soft tissue density is present in the RIGHT external auditory canal. There is no basilar skull fracture. The mastoid air cells are clear. Bilateral lens extractions. Calvarium intact.  IMPRESSION: 1. Atrophy and chronic ischemic white matter disease without acute intracranial abnormality. 2. Soft tissue density in the RIGHT external auditory canal is nonspecific by CT. This appears external to the middle ear. Correlate with physical exam. No basilar skull fracture in this patient with reported hemotympanum.   Electronically Signed   By: Dereck Ligas M.D.   On:  04/07/2015 15:51   Dg Chest Portable 1 View  04/07/2015   CLINICAL DATA:  Chest pain and shortness of breath.  EXAM: PORTABLE CHEST - 1 VIEW  COMPARISON:  None.  FINDINGS: There is cardiomegaly with small bilateral pleural effusions. Slight distention of the azygos vein. Interstitial markings are slightly accentuated with what appear to be Kerley B-lines at the bases.  No acute osseous abnormality. Old compression fracture in the lower thoracic spine treated with vertebroplasty.  IMPRESSION: Cardiomegaly with slight interstitial edema and small bilateral pleural effusions.   Electronically Signed   By: Lorriane Shire M.D.   On: 04/07/2015 09:51     Assessment and Plan  81 y.o. female with h/o coronary artery disease, atrial fibrillation on Coumadin monitored regularly by Dr. Ron Parker, chronic combined systolic and diastolic CHF, mitral regurgitation, tricuspid regurgitation, prolonged QT, LBBB, carotid disease, venous insufficiency, HTN, thrombocytopenia, and calorie malnutrition presented to the ED on 04/07/15 with complaints of nausea, bloody emesis, burning chest pain that extended into  the neck, shortness of breath, and worsening weakness over the last 3 weeks. She has also had a productive cough for several days.  1. Chest pain/SOB with echo showing signs of ischemic event, timing unknown given negative blood work:  - constant burning chest pain that radiates to the neck and shoulder blades for 48 hours - troponins are flat trending, negative x3 (0.03--> 0.03 --> 0.04) -last stress test on 05/24/12 showed LV EF 57%; LV Wall Motion: septal dyssynergy related to the patient's underlying LBBB -echo from 6/73/41 showed systolic function moderately to severely reduced, EF 30% to 35%, HK of the anterior and anteroseptal myocardium, concern for anterior wall hypokinesis/LAD disease. Moderate MR. -high right-sided pressures, greater than 50 mmHg, possibly from depressed LV function (prior echo 02/28/2013  with EF 50-55%).   - I suspect that the patient had an ischemic event in the last few months and now we are dealing with the effects of ischemic cardiomyopathy. The patient seems to be extremely frail and I don't think she is  in good shape to undergo cardiac catheterization.  Continue optimizing medical therapy. Can consider ischemic cardiac evaluation in the future. I had a prolonged discussion about this with the patient's daughter.  2. Acute combined systolic and diastolic heart failure/ICM:  -chest x-ray showing interstitial edema and small bilateral pleural effusions -received injection of 40mg  Lasix on 7/25 and then injections of 20 mg Lasix are ordered daily, restricted I's and O's ordered per IM along with daily weights.  -down 2567 mL since admission -2D Echo from 04/07/15 as above (EF decreased from normal EF on last Echo 02/28/13), BNP from 04/07/15: 779 -Continue Lasix 20 mg IV twice daily. The patient appears to be still mildly fluid overloaded.  3. Chronic atrial fib: -Continue warfarin and small dose metoprolol.  4. HTN -Blood pressure seems to be stable today.  5. Hyperlipidemia -continue statin  6. Vomiting, possible hemoptysis/hematemesis:  -likely from projectile vomiting and retching, started on IV Protonix BID per IM on 7/25 -no longer experiencing N/V, hematemesis; normal bowel movement last evening, able to eat a good breakfast this morning  7. Petechiae and External ear canal bleeding: -Uncertain etiology -Explained to family the pharmacology of Coumadin    I agree that the patient appears to be very deconditioned and will require a skilled nursing facility.   Kathlyn Sacramento, MD [facility]

## 2015-04-11 NOTE — Discharge Summary (Addendum)
[facility] [place] NAME: [name]    MR#:  [id number]  DATE OF BIRTH:  [birth year]  DATE OF ADMISSION:  04/07/2015 ADMITTING PHYSICIAN: Max Sane, MD  DATE OF DISCHARGE:04/12/2015 PRIMARY CARE PHYSICIAN: Loura Pardon, MD    ADMISSION DIAGNOSIS:  Chest pain, unspecified chest pain type [R07.9]  DISCHARGE DIAGNOSIS:  Active Problems:   Unstable angina   Cardiomyopathy, ischemic   Acute systolic CHF (congestive heart failure)   Pulmonary hypertension   Adult failure to thrive   Chronic atrial fibrillation   Pain in the chest   SECONDARY DIAGNOSIS:   Past Medical History  Diagnosis Date  . Chronic atrial fibrillation     a. on Coumadin; b. Holter 06/2010, no brady, mild tachy  . CAD (coronary artery disease)     a. 2 DES, [facility], 2004; b. nuc 2013 septal dyssenergy 2/2 LBBB, nl LV fxn  . Pleural effusion associated with pulmonary infection 03-2007  . Hypothyroid   . Hyperlipidemia   . GERD (gastroesophageal reflux disease)   . Diverticulosis of colon   . Allergy   . Anemia   . Arthritis     osteo  . Hypertension   . COPD (chronic obstructive pulmonary disease)   . Mitral regurgitation   . Vitamin B deficiency   . Compression fracture of lumbar vertebra 05-2008  . Osteoporosis   . Thrombocytopenia   . SOB (shortness of breath)   . OA (osteoarthritis)   . LBBB (left bundle branch block)   . Hx of colonoscopy   . Warfarin anticoagulation   . TR (tricuspid regurgitation)     moderate, echo 2008  . Dyslipidemia   . Lung abnormality     Dr [name] 122/2011 no further w/u needed  . Prolonged QT interval     when on sotolol in past  . Volume overload     intermittant  . Hypertension   . Weight loss     August, 2012  . Nausea     Some nausea with medications, May, 2013,  . Orthostatic hypotension     June, 2014  . Shingles     HOSPITAL COURSE:  *This is a very pleasant 81 year old female with history of  CAD, atrial fibrillation on chronic Coumadin who was admitted for unstable angina and acute on chronic CHF.  1. Chest pain: Troponins 3 are relatively negative. Patient has been seen and evaluated by cardiology. Her 2-D echocardiogram showed an ejection fraction of 30-35% with wall motion abnormalities in the anterior anterior septal area. I appreciate radiology consultation. Coumadin is on hold due to bleeding in her ear canal. Due to her weakness and comorbidities patient would benefit from a stress test and not a cardiac catheterization which could be performed as an outpatient.   2. Acute on chronic systolic congestive heart failure: Echocardiogram shows EF of 30-35% with wall motion abnormality. Patient was on Lasix 20 IV every 12 hours. .  I would like to try lisinopril to her night time regimen. I have decreased metoprolol from 25-12.5 mg by mouth twice a day Which allows [place] to use Lasix and metoprolol in the a.m.  3. Bleeding from right ear with laceration to canal with unknown cause: . I appreciate ENT consultation. Continue Ciprodex drops to right ear twice a day for 4 more days days. Cottonball to ear for drainage. Patient will need outpatient follow-up in 1 week with ENT.  4. CHRONIC atrial fibrillation: Patient's  heart rate is controlled. Coumadin has been restarted. Continue metoprolol. INR needs to be monitored daily 5. Vomiting: Patient's vomiting is improved. Continue PPI.  6. Pet[place]ia to the face after a vomiting episode. Improved   7. Weakness: Patient will be discharged skilled nursing facility   [place] DIET:  Patient will be discharged in stable condition Low sodium heart healthy diet  CONSULTS OBTAINED:  Treatment Team:  Wellington Hampshire, MD Thompson Grayer, MD  DRUG ALLERGIES:   Allergies  Allergen Reactions  . Sulfonamide Derivatives Nausea And Vomiting    REACTION: sick  . Alendronate Sodium Other (See Comments)    REACTION: GI     DISCHARGE MEDICATIONS:   Current Discharge Medication List    START taking these medications   Details  lisinopril (PRINIVIL,ZESTRIL) 2.5 MG tablet Take 1 tablet (2.5 mg total) by mouth at bedtime. Qty: 30 tablet, Refills: 0    NEOMYCIN-POLYMYXIN-HYDROCORTISONE (CORTISPORIN) 1 % SOLN otic solution Place 3 drops into the right ear every 8 (eight) hours. Qty: 10 mL, Refills: 0    !! warfarin (COUMADIN) 5 MG tablet Take 1 tablet (5 mg total) by mouth daily. Qty: 30 tablet, Refills: 0     !! - Potential duplicate medications found. Please discuss with provider.    CONTINUE these medications which have CHANGED   Details  furosemide (LASIX) 40 MG tablet Take 1 tablet daily as needed for increased weight, edema and SOB Qty: 30 tablet, Refills: 0    metoprolol tartrate (LOPRESSOR) 25 MG tablet Take 0.5 tablets (12.5 mg total) by mouth 2 (two) times daily. Qty: 30 tablet, Refills: 0      CONTINUE these medications which have NOT CHANGED   Details  calcium carbonate (OS-CAL) 600 MG TABS tablet Take 1,200 mg by mouth at bedtime.    ferrous sulfate 325 (65 FE) MG tablet Take 325 mg by mouth daily. With lunch    Glucosamine HCl 500 MG TABS Take 1 tablet by mouth daily.    levothyroxine (SYNTHROID, LEVOTHROID) 88 MCG tablet TAKE 1 TABLET EVERY DAY Qty: 90 tablet, Refills: 0    Lutein 6 MG CAPS Take 1 capsule by mouth daily.     midodrine (PROAMATINE) 5 MG tablet TAKE ONE TABLET BY MOUTH TWICE DAILY WITH A MEAL Qty: 180 tablet, Refills: 0    Multiple Vitamin (MULTIVITAMIN) tablet Take 1 tablet by mouth daily.     pantoprazole (PROTONIX) 40 MG tablet Take 1 tablet (40 mg total) by mouth daily. Qty: 90 tablet, Refills: 3    potassium chloride (K-DUR) 10 MEQ tablet Take 10 mEq by mouth daily. With lunch    potassium chloride (K-DUR,KLOR-CON) 10 MEQ tablet TAKE 1 TABLET EVERY DAY Qty: 90 tablet, Refills: 0    senna-docusate (SENOKOT-S) 8.6-50 MG per tablet Take 1 tablet by  mouth at bedtime.    simvastatin (ZOCOR) 5 MG tablet Take 1 tablet (5 mg total) by mouth every evening.    !! warfarin (COUMADIN) 5 MG tablet Take 1 1/2 pills daily or as directed Qty: 135 tablet, Refills: 2     !! - Potential duplicate medications found. Please discuss with provider.    STOP taking these medications     gabapentin (NEURONTIN) 300 MG capsule      nitroGLYCERIN (NITROSTAT) 0.4 MG SL tablet               Today   CHIEF COMPLAINT:  Patient is doing well this morning. Patient is ready for discharge to skilled  nursing facility. Patient has no shortness of breath or chest pain. Patient does have weakness.   VITAL SIGNS:  Blood pressure 106/57, pulse 100, temperature 98.6 F (37 C), temperature source Oral, resp. rate 18, height 5\' 5"  (1.651 m), weight 58.922 kg (129 lb 14.4 oz), SpO2 98 %.   REVIEW OF SYSTEMS:  Review of Systems  Constitutional: Negative for fever, chills and malaise/fatigue.  HENT: Negative for sore throat.   Eyes: Negative for blurred vision.  Respiratory: Negative for cough, hemoptysis, shortness of breath and wheezing.   Cardiovascular: Negative for chest pain, palpitations and leg swelling.  Gastrointestinal: Negative for nausea, vomiting, abdominal pain, diarrhea and blood in stool.  Genitourinary: Negative for dysuria.  Musculoskeletal: Negative for back pain.  Neurological: Positive for weakness. Negative for dizziness, tremors and headaches.  Endo/Heme/Allergies: Does not bruise/bleed easily.     PHYSICAL EXAMINATION:  GENERAL:  81 y.o.-year-old patient lying in the bed with no acute distress.  NECK:  Supple, no jugular venous distention. No thyroid enlargement, no tenderness.  LUNGS: Normal breath sounds bilaterally, no wheezing, rales,rhonchi  No use of accessory muscles of respiration.  CARDIOVASCULAR: Irregular irregular 2/6 systolic ejection murmur   ABDOMEN: Soft, non-tender, non-distended. Bowel sounds present. No  organomegaly or mass.  EXTREMITIES: No pedal edema, cyanosis, or clubbing.  PSYCHIATRIC: The patient is alert and oriented x 3.  SKIN: No obvious rash, lesion, or ulcer.  ENT: Cotton ball placed right ear no bleeding and canal DATA REVIEW:   CBC  Recent Labs Lab 04/10/15 0333  WBC 8.6  HGB 11.7*  HCT 35.5  PLT 109*    Chemistries   Recent Labs Lab 04/11/15 0408  NA 139  K 4.1  CL 100*  CO2 31  GLUCOSE 95  BUN 24*  CREATININE 1.16*  CALCIUM 8.5*    Cardiac Enzymes  Recent Labs Lab 04/07/15 1216 04/07/15 1707 04/08/15 0016  TROPONINI 0.03 0.03 0.04*    Microbiology Results  @[email or web address] @  RADIOLOGY:  No results found.    Management plans discussed with the patient and daughterand they are is in agreement. Stable for discharge SNF  Patient should follow up with Cardiology and ENT 1 week  CODE STATUS:     Code Status Orders        Start     Ordered   04/07/15 1942  Do not attempt resuscitation (DNR)   Continuous    Question Answer Comment  In the event of cardiac or respiratory ARREST Do not call a "code blue"   In the event of cardiac or respiratory ARREST Do not perform Intubation, CPR, defibrillation or ACLS   In the event of cardiac or respiratory ARREST Use medication by any route, position, wound care, and other measures to relive pain and suffering. May use oxygen, suction and manual treatment of airway obstruction as needed for comfort.      04/07/15 1941    Advance Directive Documentation        Most Recent Value   Type of Advance Directive  Healthcare Power of Attorney, Living will   Pre-existing out of facility DNR order (yellow form or pink MOST form)     "MOST" Form in Place?        TOTAL TIME TAKING CARE OF THIS PATIENT: 35 minutes.    Brekyn Huntoon M.D on 04/11/2015 at 11:53 AM  Between 7am to 6pm - Pager - [phone] After 6pm go to [email or web address] - password Child psychotherapist Hospitalists  Office  [phone]  CC: Primary care physician; Loura Pardon, MD

## 2015-04-11 NOTE — Progress Notes (Signed)
   04/11/15 0950  Clinical Encounter Type  Visited With Patient and family together  Visit Type Follow-up  Referral From Chaplain  Consult/Referral To Chaplain  Spiritual Encounters  Spiritual Needs Prayer;Emotional  Stress Factors  Patient Stress Factors Exhausted;Health changes  Family Stress Factors Exhausted;Family relationships;Major life changes  Met w/family as patient rested. Provided emotional support, follow up, & prayer. Chap. Dae Antonucci G. [place]

## 2015-04-11 NOTE — Care Management Important Message (Signed)
Important Message  Patient Details  Name: MORGIN HALLS MRN: [id number] Date of Birth: 05/02/1922   Medicare Important Message Given:  Yes-second notification given    Katrina Stack, RN 04/11/2015, 9:36 AM

## 2015-04-11 NOTE — Care Management (Signed)
D[facility] caremanager Amy White.  Physical therapy is recommending skilled nursing placement for rehab.   Attending anticipates patient may be medically stable for discharge within the next 24 hours.  UPdated CSW

## 2015-04-11 NOTE — Progress Notes (Signed)
Physical Therapy Treatment Patient Details Name: [name] MRN: [id number] DOB: [birth year] Today's Date: 04/11/2015    History of Present Illness Pt is a 81 y.o. female presenting to hospital with chest pain, SOB, and emesis.  Pt admitted to hospital with unstable angina and acute on chronic CHF.  Pt noted to have blood in L ear canal (ENT consult).  CT of head negative for acute intracranial abnormality.  Chest x-ray shows interstital edema and small B pleural effusions.    PT Comments    Pt able to progress ambulation distance to 60 feet today with RW but requiring increased assist to stand with RW today and appeared more fatigued in general than on initial eval.  Pt's daughter does not feel she can provide for pt at her home d/t pt appearing weaker and requiring increased assist.  Pt would benefit from STR to improve strength, balance, activity tolerance, and increase independence with functional mobility (pt agreeable to STR as well as pt's daughter; care management notified).  Follow Up Recommendations  SNF     Equipment Recommendations       Recommendations for Other Services       Precautions / Restrictions Precautions Precautions: Fall Restrictions Weight Bearing Restrictions: No    Mobility  Bed Mobility Overal bed mobility: Needs Assistance Bed Mobility: Supine to Sit     Supine to sit: Supervision        Transfers Overall transfer level: Needs assistance Equipment used: Rolling walker (2 wheeled) Transfers: Sit to/from Stand Sit to Stand: Min assist         General transfer comment: assist required to initiate stand  Ambulation/Gait Ambulation/Gait assistance: Min guard;+2 safety/equipment (2nd assist for supplemental O2 tank) Ambulation Distance (Feet): 60 Feet Assistive device: Rolling walker (2 wheeled) Gait Pattern/deviations: Step-through pattern     General Gait Details: decreased cadence; decreased B step length/foot  clearance/heelstrike; increased trunk flexion with distance; limited distance d/t fatigue   Stairs            Wheelchair Mobility    Modified Rankin (Stroke Patients Only)       Balance Overall balance assessment: Needs assistance Sitting-balance support: No upper extremity supported;Feet supported Sitting balance-Leahy Scale: Good     Standing balance support: Bilateral upper extremity supported (on RW) Standing balance-Leahy Scale: Fair                      Cognition Arousal/Alertness: Awake/alert Behavior During Therapy: WFL for tasks assessed/performed Overall Cognitive Status: Within Functional Limits for tasks assessed                      Exercises   Performed semi-supine B LE therapeutic exercise x 10 reps:  Ankle pumps (AROM B LE's); quad sets x3 second holds (AROM B LE's); glute squeezes x3 second holds (AROM B); SAQ's (AROM R; AROM L); heelslides (AROM R; AROM L), hip abd/adduction (AROM R; AROM L), and SLR (AROM R; AROM L).  Pt required vc's and tactile cues for correct technique with exercises.     General Comments   Nursing cleared pt for participation in physical therapy.  Pt and pt's daughter agreeable to PT session.       Pertinent Vitals/Pain Pain Assessment: No/denies pain  HR 114-137 bpm with activity (appeared tachy on telemetry). O2 >92% on 2 L/min via nasal cannula.    Home Living  Prior Function            PT Goals (current goals can now be found in the care plan section) Acute Rehab PT Goals Patient Stated Goal: To ambulate more PT Goal Formulation: With patient Time For Goal Achievement: 04/23/15 Potential to Achieve Goals: Good Progress towards PT goals: Progressing toward goals    Frequency  Min 2X/week    PT Plan Discharge plan needs to be updated    Co-evaluation             End of Session Equipment Utilized During Treatment: Gait belt;Oxygen (2 L/min via nasal  cannula) Activity Tolerance: Patient limited by fatigue Patient left: in chair;with call bell/phone within reach;with chair alarm set;with family/visitor present     Time: 4782-9562 PT Time Calculation (min) (ACUTE ONLY): 40 min  Charges:  $Gait Training: 8-22 mins $Therapeutic Exercise: 8-22 mins $Therapeutic Activity: 8-22 mins                    G CodesLeitha Bleak April 14, 2015, 12:41 PM Leitha Bleak, [place]

## 2015-04-11 NOTE — Telephone Encounter (Signed)
Noted  

## 2015-04-11 NOTE — Clinical Social Work Note (Signed)
Clinical Social Work Assessment  Patient Details  Name: [name] MRN: [id number] Date of Birth: [birth year]  Date of referral:  04/11/15               Reason for consult:  Facility Placement                Permission sought to share information with:  Family Supports, Chartered certified accountant granted to share information::  Yes, Verbal Permission Granted  Name::        Agency::     Relationship::     Contact Information:     Housing/Transportation Living arrangements for the past 2 months:  [place] of Information:  Patient, Adult Children Patient Interpreter Needed:  None Criminal Activity/Legal Involvement Pertinent to Current Situation/Hospitalization:  No - Comment as needed Significant Relationships:  Adult Children Lives with:  Adult Children Do you feel safe going back to the place where you live?  No Need for family participation in patient care:  Yes (Comment)  Care giving concerns:  Pt and pt's daughter [name] [phone] did not think that pt was strong enough to go home yet.    Social Worker assessment / plan:  CSW spoke to pt.  She was sitting up in her recliner.  She was alert and Ox3.  CSW was given verbal permission to speak to pt's daughter.  Both pt and daughter were in favor of DC to SNF.  Hey would prefer [facility] or [facility] if possible.  CSW will f/u once offers have been received.   Employment status:  Retired Forensic scientist:    PT Recommendations:  [place] / Referral to community resources:  [place]  Patient/Family's Response to care:  Pt was in agreement with DC to SNF  Patient/Family's Understanding of and Emotional Response to Diagnosis, Current Treatment, and Prognosis: Pt was in agreement with DC to SNF, both verbalized their understanding of DC plan.    Emotional Assessment Appearance:  Appears stated age Attitude/Demeanor/Rapport:   (DIfficult  for pt to speak but she was pleasent.) Affect (typically observed):   (tired) Orientation:  Oriented to Self, Oriented to Place, Oriented to  Time Alcohol / Substance use:  Never Used Psych involvement (Current and /or in the community):  No (Comment)  Discharge Needs  Concerns to be addressed:    Readmission within the last 30 days:  No Current discharge risk:  Physical Impairment Barriers to Discharge:  No Barriers Identified   Mathews Argyle, LCSW 04/11/2015, 12:07 PM

## 2015-04-11 NOTE — Clinical Social Work Placement (Signed)
   CLINICAL SOCIAL WORK PLACEMENT  NOTE  Date:  04/11/2015  Patient Details  Name: [name] MRN: [id number] Date of Birth: 1921/12/22  Clinical Social Work is seeking post-discharge placement for this patient at the [place] level of care (*CSW will initial, date and re-position this form in  chart as items are completed):  Yes   Patient/family provided with [place] Work Department's list of facilities offering this level of care within the geographic area requested by the patient (or if unable, by the patient's family).  Yes   Patient/family informed of their freedom to choose among providers that offer the needed level of care, that participate in Medicare, Medicaid or managed care program needed by the patient, have an available bed and are willing to accept the patient.  Yes   Patient/family informed of [facility]'s ownership interest in [facility] and [facility], as well as of the fact that they are under no obligation to receive care at these facilities.  PASRR submitted to EDS on       PASRR number received on       Existing PASRR number confirmed on 04/11/15     FL2 transmitted to all facilities in geographic area requested by pt/family on 04/11/15     FL2 transmitted to all facilities within larger geographic area on       Patient informed that his/her managed care company has contracts with or will negotiate with certain facilities, including the following:   (SNF packet given with this information on it)     Yes   Patient/family informed of bed offers received.  Patient chooses bed at  [facility] )     Physician recommends and patient chooses bed at      Patient to be transferred to   on  .  Patient to be transferred to facility by       Patient family notified on   of transfer.  Name of family member notified:        PHYSICIAN       Additional Comment:     _______________________________________________ Mathews Argyle, LCSW 04/11/2015, 12:28 PM

## 2015-04-11 NOTE — Progress Notes (Signed)
ANTICOAGULATION CONSULT NOTE - Initial Consult  Pharmacy Consult for Warfarin Indication: atrial fibrillation  Allergies  Allergen Reactions  . Sulfonamide Derivatives Nausea And Vomiting    REACTION: sick  . Alendronate Sodium Other (See Comments)    REACTION: GI    Patient Measurements: Height: 5\' 5"  (165.1 cm) Weight: 129 lb 14.4 oz (58.922 kg) IBW/kg (Calculated) : 57   Vital Signs: Temp: 98.6 F (37 C) (07/29 0958) Temp Source: Oral (07/29 0958) BP: 106/57 mmHg (07/29 0958) Pulse Rate: 100 (07/29 0958)  Labs:  Recent Labs  04/09/15 0329 04/10/15 0333 04/10/15 1311 04/11/15 0408  HGB 11.6* 11.7*  --   --   HCT 34.9* 35.5  --   --   PLT 88* 109*  --   --   LABPROT  --   --  18.0* 17.8*  INR  --   --  1.47 1.45  CREATININE 1.22* 1.10*  --  1.16*    Estimated Creatinine Clearance: 28.4 mL/min (by C-G formula based on Cr of 1.16).   Medical History: Past Medical History  Diagnosis Date  . Chronic atrial fibrillation     [name]. on Coumadin; b. Holter 06/2010, no brady, mild tachy  . CAD (coronary artery disease)     [name]. 2 DES, [facility], 2004; b. nuc 2013 septal dyssenergy 2/2 LBBB, nl LV fxn  . Pleural effusion associated with pulmonary infection 03-2007  . Hypothyroid   . Hyperlipidemia   . GERD (gastroesophageal reflux disease)   . Diverticulosis of colon   . Allergy   . Anemia   . Arthritis     osteo  . Hypertension   . COPD (chronic obstructive pulmonary disease)   . Mitral regurgitation   . Vitamin B deficiency   . Compression fracture of lumbar vertebra 05-2008  . Osteoporosis   . Thrombocytopenia   . SOB (shortness of breath)   . OA (osteoarthritis)   . LBBB (left bundle branch block)   . Hx of colonoscopy   . Warfarin anticoagulation   . TR (tricuspid regurgitation)     moderate, echo 2008  . Dyslipidemia   . Lung abnormality     Dr [name] 122/2011 no further w/u needed  . Prolonged QT interval     when on sotolol in past  . Volume overload      intermittant  . Hypertension   . Weight loss     August, 2012  . Nausea     Some nausea with medications, May, 2013,  . Orthostatic hypotension     June, 2014  . Shingles     Medications:  Scheduled:  . calcium carbonate  400 mg of elemental calcium Oral QHS  . ciprofloxacin-dexamethasone  4 drop Right Ear BID  . ferrous sulfate  325 mg Oral Daily  . furosemide  20 mg Intravenous Q12H  . levothyroxine  88 mcg Oral QAC breakfast  . metoprolol  12.5 mg Oral BID  . midodrine  5 mg Oral BID WC  . pantoprazole  40 mg Oral BID AC  . potassium chloride  10 mEq Oral Daily  . senna-docusate  1 tablet Oral QHS  . simvastatin  5 mg Oral QPM  . warfarin  5 mg Oral Daily  . Warfarin - Pharmacist Dosing Inpatient   Does not apply q1800    Assessment: Patient is [name] 81 yo female with atrial fibrillation.  Patient takes warfarin as an outpatient for chronic anticoagulation.  Per notes, patient takes warfarin 5 mg  po daily except for 7.5 mg po on Wed and Sun.    No warfarin given during this admission.  INR today 1.45   Goal of Therapy:  INR 2-3 Monitor hemoglobin per policy   Plan:  Will continue patient on warfarin 5 mg po daily.  Has missed several doses for bleeding, restarted yesterday. Will continue to follow INR.   [name] [name] 04/11/2015,10:26 AM

## 2015-04-11 NOTE — Care Management (Signed)
Informed attending that patient's daughter would like to speak directly to discharging physician and be assured cardiology is also in agreement with discharge when it occurs

## 2015-04-11 NOTE — Progress Notes (Signed)
[place] [facility] [place] NAME: [name]    MR#:  [id number]  DATE OF BIRTH:  08/22/1922  SUBJECTIVE:  No acute issues overnight. Patient will be discharged to skilled nursing facility once medically stable. Patient's blood pressure improved this morning and did take Lasix this morning. REVIEW OF SYSTEMS:    Review of Systems  Constitutional: Positive for malaise/fatigue. Negative for fever and chills.  HENT: Negative for congestion, ear discharge, ear pain and sore throat.   Eyes: Negative for blurred vision.  Respiratory: Negative for cough, hemoptysis, shortness of breath, wheezing and stridor.   Cardiovascular: Negative for chest pain, palpitations and leg swelling.  Gastrointestinal: Negative for nausea, vomiting, abdominal pain, diarrhea and blood in stool.  Genitourinary: Negative for dysuria.  Musculoskeletal: Negative for back pain.  Neurological: Positive for weakness. Negative for dizziness, tremors, sensory change, speech change, focal weakness, seizures, loss of consciousness and headaches.  Endo/Heme/Allergies: Does not bruise/bleed easily.    Tolerating Diet: Yes      DRUG ALLERGIES:   Allergies  Allergen Reactions  . Sulfonamide Derivatives Nausea And Vomiting    REACTION: sick  . Alendronate Sodium Other (See Comments)    REACTION: GI    VITALS:  Blood pressure 106/57, pulse 100, temperature 98.6 F (37 C), temperature source Oral, resp. rate 18, height 5\' 5"  (1.651 m), weight 58.922 kg (129 lb 14.4 oz), SpO2 98 %.  PHYSICAL EXAMINATION:   Physical Exam  Constitutional: She is oriented to person, place, and time and well-developed, well-nourished, and in no distress. No distress.  HENT:  Head: Normocephalic.  No blood noted in ear canal  Eyes: No scleral icterus.  Neck: Normal range of motion. Neck supple. No JVD present. No tracheal deviation present.  Cardiovascular: Normal rate and regular  rhythm.  Exam reveals no gallop and no friction rub.   Murmur heard. Atrial fibrillation  Pulmonary/Chest: Effort normal and breath sounds normal. No respiratory distress. She has no wheezes. She has no rales. She exhibits no tenderness.  Decreased breath sounds throughout  Abdominal: Soft. Bowel sounds are normal. She exhibits no distension and no mass. There is no tenderness. There is no rebound and no guarding.  Musculoskeletal: Normal range of motion. She exhibits no edema.  Neurological: She is alert and oriented to person, place, and time.  Skin: Skin is warm. No rash noted. No erythema.  Petechiae on face  Psychiatric: Affect and judgment normal.      LABORATORY PANEL:   CBC  Recent Labs Lab 04/10/15 0333  WBC 8.6  HGB 11.7*  HCT 35.5  PLT 109*   ------------------------------------------------------------------------------------------------------------------  Chemistries   Recent Labs Lab 04/11/15 0408  NA 139  K 4.1  CL 100*  CO2 31  GLUCOSE 95  BUN 24*  CREATININE 1.16*  CALCIUM 8.5*   ------------------------------------------------------------------------------------------------------------------  Cardiac Enzymes  Recent Labs Lab 04/07/15 1216 04/07/15 1707 04/08/15 0016  TROPONINI 0.03 0.03 0.04*   ------------------------------------------------------------------------------------------------------------------  RADIOLOGY:  No results found.   ASSESSMENT AND PLAN:   This is a very pleasant 81 year old female with history of CAD, atrial fibrillation on chronic Coumadin who was admitted for unstable angina and acute on chronic CHF.  1. Chest pain: Troponins 3 are relatively negative. Patient has been seen and evaluated by cardiology. Her 2-D echocardiogram showed an ejection fraction of 30-35% with wall motion abnormalities in the anterior anterior septal area.  I appreciate radiology consultation. Coumadin is on hold due  to bleeding in her  ear canal.  Due to her weakness and comorbidities patient would benefit from a stress test and not a cardiac catheterization which could be performed as an outpatient.   2. Acute on chronic systolic congestive heart failure: Echocardiogram shows EF of 30-35% with wall motion abnormality. Patient is continued on Lasix 20 IV every 12 hours. . Continue to monitor I's and O's and daily weight. Further recommendations as per cardiology consultation. I would like to try lisinopril however due to low blood pressure she is unable to tolerate this and will try to add this to her night time regimen. I have decreased metoprolol from 25-12.5 mg by mouth twice a day Which allows  [place] to use Lasix and metoprolol in the a.m.  3. Bleeding from right ear with laceration to canal with unknown cause: . I appreciate ENT consultation. Continue Ciprodex drops to right ear twice a day for 4 more days days. Cottonball to ear for drainage. Patient will need outpatient follow-up in 1 week with ENT.  4. CHRONIC atrial fibrillation: Patient's heart rate is controlled. Coumadin has been restarted. Continue metoprolol.  5. Vomiting: Patient's vomiting is improved. Continue PPI.  6. Petechia to the face after a vomiting episode. Improved   7. Weakness: Patient will be discharged skilled nursing facility  Management plans discussed with the patient and daughter and they are in agreement.[name]  CODE STATUS: DO NOT RESUSCITATE  TOTAL TIME TAKING CARE OF THIS PATIENT: 25 minutes.   Greater than 50% counseling and coordination of care  POSSIBLE D/C tomorrow DEPENDING ON CLINICAL CONDITION.   Betul Brisky M.D on 04/11/2015 at 11:46 AM  Between 7am to 6pm - Pager - [phone] After 6pm go to [email or web address] - password [email or web address]  [facility]  Office  [phone]  CC: Primary care physician; Loura Pardon, MD

## 2015-04-12 LAB — BASIC METABOLIC PANEL
ANION GAP: 9 (ref 5–15)
BUN: 27 mg/dL — ABNORMAL HIGH (ref 6–20)
CALCIUM: 8.6 mg/dL — AB (ref 8.9–10.3)
CHLORIDE: 100 mmol/L — AB (ref 101–111)
CO2: 30 mmol/L (ref 22–32)
Creatinine, Ser: 1.32 mg/dL — ABNORMAL HIGH (ref 0.44–1.00)
GFR calc non Af Amer: 34 mL/min — ABNORMAL LOW (ref >60)
GFR, EST AFRICAN AMERICAN: 40 mL/min — AB (ref >60)
Glucose, Bld: 88 mg/dL (ref 65–99)
Potassium: 3.8 mmol/L (ref 3.5–5.1)
SODIUM: 139 mmol/L (ref 135–145)

## 2015-04-12 LAB — PROTIME-INR
INR: 1.59
Prothrombin Time: 19.1 seconds — ABNORMAL HIGH (ref 11.4–15.0)

## 2015-04-12 MED ORDER — FUROSEMIDE 20 MG PO TABS: 20.0000 mg | ORAL_TABLET | Freq: Every day | ORAL | Status: DC

## 2015-04-12 NOTE — Progress Notes (Signed)
Patient: [name] / Admit Date: 04/07/2015 / Date of Encounter: 04/12/2015, 12:32 PM   Subjective: Feels well today, little SOB. Sitting in recliner Much more alert without neurontin.  Creatinine and BUN starting to climb Legs are weak. Long discussion with patient and family concerning long term care.     Review of Systems: Review of Systems  Constitutional: Positive for malaise/fatigue. Negative for fever, chills, weight loss and diaphoresis.  HENT: no pain Eyes: Negative for pain, discharge and redness.  Respiratory: Positive for cough,  and mild shortness of breath. Negative for wheezing.  Cardiovascular:No chest paina. Negative for palpitations, orthopnea and PND.  Gastrointestinal: Negative for nausea, vomiting, abdominal pain, diarrhea and constipation.  Musculoskeletal: Positive for back pain  Skin: Positive for rash.   Continued petechiae and redness around mouth and on cheeks (left worse than right)  Neurological: Positive for weakness. Negative for dizziness, loss of consciousness and headaches.  Psychiatric/Behavioral: The patient is not nervous/anxious.  All other systems reviewed and are negative.   Objective: Telemetry: A fib, HR mid 80's to mid 90's this AM, HR overnight up to 170's Physical Exam: Blood pressure 115/71, pulse 77, temperature 98.2 F (36.8 C), temperature source Oral, resp. rate 19, height 5\' 5"  (1.651 m), weight 128 lb 11.2 oz (58.378 kg), SpO2 97 %. Body mass index is 21.42 kg/(m^2). General: Frail older woman, in no acute distress, sleeping off and on during visit, laying in bed with daughter at bedside, appears to be progressively weakening Head: Normocephalic, atraumatic, sclera non-erythematous and non-icteric, no xanthomas, nares are without discharge. Petechiae around mouth and on cheeks bilaterally, R>L. Neck: Negative for carotid bruits. JVP not elevated. Lungs: Decreased breath sounds at bases bilaterally. No  wheezes, rales, or rhonchi. Breathing is unlabored. Heart: Irregular-irregular, S1 S2. II/VI murmur at apex, radiates to axilla. No rubs or gallops.  Abdomen: Soft, non-tender, slightly distended with normoactive bowel sounds. No rebound/guarding. Extremities: No clubbing or cyanosis. No edema, wearing compression socks. Some bruising on lower legs bilaterally present since admission.  Neuro: Alert and oriented X 3. Moves all extremities spontaneously. Psych: Responds to questions appropriately with a normal affect.  Intake/Output Summary (Last 24 hours) at 04/12/15 1232 Last data filed at 04/12/15 1040  Gross per 24 hour  Intake    480 ml  Output    825 ml  Net   -345 ml    Inpatient Medications:  . calcium carbonate  400 mg of elemental calcium Oral QHS  . ciprofloxacin-dexamethasone  4 drop Right Ear BID  . ferrous sulfate  325 mg Oral Daily  . furosemide  20 mg Intravenous Q12H  . levothyroxine  88 mcg Oral QAC breakfast  . metoprolol  12.5 mg Oral BID  . midodrine  5 mg Oral BID WC  . pantoprazole  40 mg Oral BID AC  . potassium chloride  10 mEq Oral Daily  . senna-docusate  1 tablet Oral QHS  . simvastatin  5 mg Oral QPM  . warfarin  5 mg Oral Daily  . Warfarin - Pharmacist Dosing Inpatient   Does not apply q1800   Infusions:    Labs:  Recent Labs  04/11/15 0408 04/12/15 0424  NA 139 139  K 4.1 3.8  CL 100* 100*  CO2 31 30  GLUCOSE 95 88  BUN 24* 27*  CREATININE 1.16* 1.32*  CALCIUM 8.5* 8.6*   No results for input(s): AST, ALT, ALKPHOS, BILITOT, PROT, ALBUMIN in the last 72 hours.  Recent Labs  04/10/15 0333  WBC 8.6  HGB 11.7*  HCT 35.5  MCV 96.1  PLT 109*   No results for input(s): CKTOTAL, CKMB, TROPONINI in the last 72 hours. Invalid input(s): POCBNP No results for input(s): HGBA1C in the last 72 hours.   Weights: Filed Weights   04/10/15 0351 04/11/15 0526 04/12/15 0431  Weight: 130 lb (58.968 kg) 129 lb 14.4 oz (58.922 kg) 128 lb 11.2  oz (58.378 kg)     Radiology/Studies:  Ct Head Wo Contrast  04/07/2015   CLINICAL DATA:  Fall. Blood in the RIGHT ear canal. Initial encounter. hemotympanum  EXAM: CT HEAD WITHOUT CONTRAST  TECHNIQUE: Contiguous axial images were obtained from the base of the skull through the vertex without intravenous contrast.  COMPARISON:  None.  FINDINGS: No mass lesion, mass effect, midline shift, hydrocephalus, hemorrhage. No acute territorial cortical ischemia/infarct. Atrophy and chronic ischemic white matter disease is present. Benign basal ganglia calcifications are present. Motion artifact was present on the initial scan. Scan was repeated with improvement.  Soft tissue density is present in the RIGHT external auditory canal. There is no basilar skull fracture. The mastoid air cells are clear. Bilateral lens extractions. Calvarium intact.  IMPRESSION: 1. Atrophy and chronic ischemic white matter disease without acute intracranial abnormality. 2. Soft tissue density in the RIGHT external auditory canal is nonspecific by CT. This appears external to the middle ear. Correlate with physical exam. No basilar skull fracture in this patient with reported hemotympanum.   Electronically Signed   By: Dereck Ligas M.D.   On: 04/07/2015 15:51   Dg Chest Portable 1 View  04/07/2015   CLINICAL DATA:  Chest pain and shortness of breath.  EXAM: PORTABLE CHEST - 1 VIEW  COMPARISON:  None.  FINDINGS: There is cardiomegaly with small bilateral pleural effusions. Slight distention of the azygos vein. Interstitial markings are slightly accentuated with what appear to be Kerley B-lines at the bases.  No acute osseous abnormality. Old compression fracture in the lower thoracic spine treated with vertebroplasty.  IMPRESSION: Cardiomegaly with slight interstitial edema and small bilateral pleural effusions.   Electronically Signed   By: Lorriane Shire M.D.   On: 04/07/2015 09:51     Assessment and Plan  81 y.o. female with h/o  coronary artery disease, atrial fibrillation on Coumadin monitored regularly by Dr. Ron Parker, chronic combined systolic and diastolic CHF, mitral regurgitation, tricuspid regurgitation, prolonged QT, LBBB, carotid disease, venous insufficiency, HTN, thrombocytopenia, and calorie malnutrition presented to the ED on 04/07/15 with complaints of nausea, bloody emesis, burning chest pain that extended into the neck, shortness of breath, and worsening weakness over the last 3 weeks. She has also had a productive cough for several days.  1. Chest pain/SOB with echo showing signs of possible ischemic event Anterior wall hypokinesis on echo Chest pain resolved (possibly gerd) - troponins are flat trending, negative x3 (0.03--> 0.03 --> 0.04) EF 30% to 35% -high right-sided pressures, greater than 50 mmHg, likely improved after diuresis --We'll decrease Lasix down to 20 mg daily with KCl 10 mEq -Outpatient follow-up for repeat echocardiogram If still with all motion abnormality, could consider Myoview We'll hold off on cardiac catheterization at this time given how frail a patient is an currently with no symptoms of angina  2. Acute combined systolic and diastolic heart failure/ICM:  She's done well on Lasix 20 mg IV twice a day Creatinine is starting to climb. We'll decrease Lasix down to 20 mg by mouth daily  3. Chronic atrial fib: Coumadin has been restarted  4. HTN Blood pressure stable on current medications  5. Hyperlipidemia -continue statin   [place]

## 2015-04-12 NOTE — Clinical Social Work Note (Signed)
CSW notified RN that pt's DC packet was ready and pt would DC to [facility] today via EMS unless family requests otherwise.  CSW signing off unless further needs arise

## 2015-04-12 NOTE — Progress Notes (Signed)
ANTICOAGULATION CONSULT NOTE - Initial Consult  Pharmacy Consult for Warfarin Indication: atrial fibrillation  Allergies  Allergen Reactions  . Sulfonamide Derivatives Nausea And Vomiting    REACTION: sick  . Alendronate Sodium Other (See Comments)    REACTION: GI    Patient Measurements: Height: 5\' 5"  (165.1 cm) Weight: 128 lb 11.2 oz (58.378 kg) IBW/kg (Calculated) : 57   Vital Signs: Temp: 97.8 F (36.6 C) (07/30 0431) Temp Source: Oral (07/30 0431) BP: 121/71 mmHg (07/30 0431) Pulse Rate: 87 (07/30 0431)  Labs:  Recent Labs  04/10/15 0333 04/10/15 1311 04/11/15 0408 04/12/15 0424  HGB 11.7*  --   --   --   HCT 35.5  --   --   --   PLT 109*  --   --   --   LABPROT  --  18.0* 17.8* 19.1*  INR  --  1.47 1.45 1.59  CREATININE 1.10*  --  1.16*  --     Estimated Creatinine Clearance: 28.4 mL/min (by C-G formula based on Cr of 1.16).   Medical History: Past Medical History  Diagnosis Date  . Chronic atrial fibrillation     a. on Coumadin; b. Holter 06/2010, no brady, mild tachy  . CAD (coronary artery disease)     a. 2 DES, [facility], 2004; b. nuc 2013 septal dyssenergy 2/2 LBBB, nl LV fxn  . Pleural effusion associated with pulmonary infection 03-2007  . Hypothyroid   . Hyperlipidemia   . GERD (gastroesophageal reflux disease)   . Diverticulosis of colon   . Allergy   . Anemia   . Arthritis     osteo  . Hypertension   . COPD (chronic obstructive pulmonary disease)   . Mitral regurgitation   . Vitamin B deficiency   . Compression fracture of lumbar vertebra 05-2008  . Osteoporosis   . Thrombocytopenia   . SOB (shortness of breath)   . OA (osteoarthritis)   . LBBB (left bundle branch block)   . Hx of colonoscopy   . Warfarin anticoagulation   . TR (tricuspid regurgitation)     moderate, echo 2008  . Dyslipidemia   . Lung abnormality     Dr [name] 122/2011 no further w/u needed  . Prolonged QT interval     when on sotolol in past  . Volume overload      intermittant  . Hypertension   . Weight loss     August, 2012  . Nausea     Some nausea with medications, May, 2013,  . Orthostatic hypotension     June, 2014  . Shingles     Medications:  Scheduled:  . calcium carbonate  400 mg of elemental calcium Oral QHS  . ciprofloxacin-dexamethasone  4 drop Right Ear BID  . ferrous sulfate  325 mg Oral Daily  . furosemide  20 mg Intravenous Q12H  . levothyroxine  88 mcg Oral QAC breakfast  . metoprolol  12.5 mg Oral BID  . midodrine  5 mg Oral BID WC  . pantoprazole  40 mg Oral BID AC  . potassium chloride  10 mEq Oral Daily  . senna-docusate  1 tablet Oral QHS  . simvastatin  5 mg Oral QPM  . warfarin  5 mg Oral Daily  . Warfarin - Pharmacist Dosing Inpatient   Does not apply q1800    Assessment: Patient is a 81 yo female with atrial fibrillation.  Patient takes warfarin as an outpatient for chronic anticoagulation.  Per notes, patient  takes warfarin 5 mg po daily except for 7.5 mg po on Wed and Sun.    7/29: INR = 1.45. Coumadin 5mg  7/30: INR = 1.59.   Goal of Therapy:  INR 2-3   Plan:  Will continue patient on warfarin 5 mg po daily.   Will follow INR daily.     Charene Mccallister K 04/12/2015,8:19 AM

## 2015-04-12 NOTE — Progress Notes (Signed)
Patient is discharge to rehab in a stable condition, no distress noted at time of puck up, denies pain, report given to floor nurse Myer Haff, left by EMS accompany by daughter

## 2015-04-13 DIAGNOSIS — I482 Chronic atrial fibrillation: Secondary | ICD-10-CM | POA: Diagnosis present

## 2015-04-13 LAB — PROTIME-INR
INR: 1.74
Prothrombin Time: 20.5 seconds — ABNORMAL HIGH (ref 11.4–15.0)

## 2015-04-14 ENCOUNTER — Ambulatory Visit: Payer: Commercial Managed Care - HMO

## 2015-04-14 ENCOUNTER — Ambulatory Visit: Payer: Commercial Managed Care - HMO | Admitting: Family Medicine

## 2015-04-14 ENCOUNTER — Telehealth: Payer: Self-pay | Admitting: *Deleted

## 2015-04-14 ENCOUNTER — Encounter
Admission: RE | Admit: 2015-04-14 | Discharge: 2015-04-14 | Disposition: A | Discharge status: Home / Self Care | Payer: Commercial Managed Care - HMO | Source: Ambulatory Visit | Attending: Internal Medicine | Admitting: Internal Medicine

## 2015-04-14 DIAGNOSIS — I4891 Unspecified atrial fibrillation: Secondary | ICD-10-CM | POA: Insufficient documentation

## 2015-04-14 LAB — PROTIME-INR
INR: 1.74
Prothrombin Time: 20.5 seconds — ABNORMAL HIGH (ref 11.4–15.0)

## 2015-04-14 NOTE — Telephone Encounter (Signed)
Patient's daughter called and Dr. Ouida Sills put her on Mirtavipine 1.5 to increase her appetite. Is this ok?

## 2015-04-14 NOTE — Telephone Encounter (Signed)
I don't see that pt has ever been seen in our office.

## 2015-04-14 NOTE — Telephone Encounter (Signed)
Transitional care call attempted.  Spoke to daughter, Fuller Song, per [place].  Patient was discharged from [facility] on 7/30.  She is currently at inpatient rehab at [facility].  She is weak, but otherwise doing well.  They are managing her Coumadin and INR there.  [name] will call back to follow up after patient is discharged from rehab.

## 2015-04-15 DIAGNOSIS — I4891 Unspecified atrial fibrillation: Secondary | ICD-10-CM | POA: Diagnosis not present

## 2015-04-15 LAB — PROTIME-INR
INR: 1.75
Prothrombin Time: 20.6 seconds — ABNORMAL HIGH (ref 11.4–15.0)

## 2015-04-16 ENCOUNTER — Other Ambulatory Visit
Admission: RE | Admit: 2015-04-16 | Discharge: 2015-04-16 | Disposition: A | Discharge status: Home / Self Care | Payer: Commercial Managed Care - HMO | Source: Other Acute Inpatient Hospital | Attending: Internal Medicine | Admitting: Internal Medicine

## 2015-04-16 DIAGNOSIS — I4891 Unspecified atrial fibrillation: Secondary | ICD-10-CM | POA: Insufficient documentation

## 2015-04-16 LAB — PROTIME-INR
INR: 1.77
Prothrombin Time: 20.8 seconds — ABNORMAL HIGH (ref 11.4–15.0)

## 2015-04-17 DIAGNOSIS — I4891 Unspecified atrial fibrillation: Secondary | ICD-10-CM | POA: Diagnosis not present

## 2015-04-17 LAB — PROTIME-INR
INR: 1.61
Prothrombin Time: 19.3 seconds — ABNORMAL HIGH (ref 11.4–15.0)

## 2015-04-19 ENCOUNTER — Other Ambulatory Visit
Admission: RE | Admit: 2015-04-19 | Discharge: 2015-04-19 | Disposition: A | Discharge status: Home / Self Care | Payer: Commercial Managed Care - HMO | Source: Ambulatory Visit | Attending: Gerontology | Admitting: Gerontology

## 2015-04-19 DIAGNOSIS — I4891 Unspecified atrial fibrillation: Secondary | ICD-10-CM | POA: Insufficient documentation

## 2015-04-19 LAB — PROTIME-INR
INR: 1.54
Prothrombin Time: 18.7 seconds — ABNORMAL HIGH (ref 11.4–15.0)

## 2015-04-20 ENCOUNTER — Other Ambulatory Visit
Admission: RE | Admit: 2015-04-20 | Discharge: 2015-04-20 | Disposition: A | Discharge status: Home / Self Care | Payer: Commercial Managed Care - HMO | Source: Ambulatory Visit | Attending: Internal Medicine | Admitting: Internal Medicine

## 2015-04-20 DIAGNOSIS — N390 Urinary tract infection, site not specified: Secondary | ICD-10-CM | POA: Diagnosis present

## 2015-04-20 LAB — URINALYSIS COMPLETE WITH MICROSCOPIC (ARMC ONLY)
BILIRUBIN URINE: NEGATIVE
Bacteria, UA: NONE SEEN
GLUCOSE, UA: NEGATIVE mg/dL
Hgb urine dipstick: NEGATIVE
Ketones, ur: NEGATIVE mg/dL
Leukocytes, UA: NEGATIVE
NITRITE: NEGATIVE
PH: 6 (ref 5.0–8.0)
Protein, ur: NEGATIVE mg/dL
Specific Gravity, Urine: 1.016 (ref 1.005–1.030)

## 2015-04-21 ENCOUNTER — Telehealth: Payer: Self-pay | Admitting: Cardiology

## 2015-04-21 NOTE — Telephone Encounter (Signed)
Pt's daughter called and very concerned that her mother was having more heart problems, HR form 119 to 1 but now with N & V - pt is in rehab- I explained I could only recommend to go to ER if she was concerned.  The facility MD is aware and ordered antiemetic and is doing EKG.  The daughter seemed satisfied with this but would like Dr. Rockey Situ to call her in the AM.  I have sent message.

## 2015-04-23 DIAGNOSIS — I4891 Unspecified atrial fibrillation: Secondary | ICD-10-CM | POA: Diagnosis not present

## 2015-04-23 LAB — PROTIME-INR
INR: 2.08
Prothrombin Time: 23.5 seconds — ABNORMAL HIGH (ref 11.4–15.0)

## 2015-04-23 NOTE — Telephone Encounter (Signed)
Can we see if we can get a copy of the EKG from the nursing home Perhaps we can talk to the nurse taking care of the patient to find out what is going on We've probably need to make sure she is on her clinic scheduled to be seen ASAP

## 2015-04-24 ENCOUNTER — Telehealth: Payer: Self-pay | Admitting: Cardiology

## 2015-04-24 ENCOUNTER — Telehealth: Payer: Self-pay

## 2015-04-24 NOTE — Telephone Encounter (Signed)
S/w Anderson Malta, nurse at [facility] [place], who states EKG performed on 8/8 at 6:06PM. Reports the following readings: 8/8 AM  HR 116  8/8 PM  HR 113, 118   BP 159/94  8/9 AM  HR 80   BP 134/79  8/9 PM  HR 79  8/10 AM BP106/67    HR 81  8/11 AM BP 122/48   HR 84  Nurse states pt is doing fine, no complaints of N/V on her shift. States she has been taking care of patient for past two days.  Faxed EKG to [facility] and note to Dr. Rockey Situ.  Pt has 8/16 at 3:15pm with Dr. Acie Fredrickson

## 2015-04-24 NOTE — Telephone Encounter (Signed)
EKG unchanged. Needs close follow-up in clinic with one of [place], Dr. Ron Parker or Dr. Cathie Olden

## 2015-04-24 NOTE — Telephone Encounter (Signed)
Georgiana Shore, RN at 04/24/2015 11:30 AM     Status: Signed       Expand All Collapse All   S/w Anderson Malta, nurse at [facility] [place], who states EKG performed on 8/8 at 6:06PM. Reports the following readings: 8/8 AMHR 116  8/8 PMHR 113, 118 BP 159/94  8/9 AMHR 80 BP 134/79  8/9 PMHR 79  8/10 AMBP106/67  HR 81  8/11 AMBP 122/48 HR 84  Nurse states pt is doing fine, no complaints of N/V on her shift. States she has been taking care of patient for past two days.  Faxed EKG to [facility] and note to Dr. Rockey Situ.  Pt has 8/16 at 3:15pm with Dr. Acie Fredrickson

## 2015-04-24 NOTE — Telephone Encounter (Signed)
Spoke with Patient daughter about upcoming appt with Dr. Acie Fredrickson.  Just wanted to be sure Dr. Ron Parker is aware and ok with her switching providers to a closer location. Daughter stated that she would like to also see Dr. Ron Parker one more time for fu in September as already scheduled.  Is it ok if they keep this appt.  ?

## 2015-04-24 NOTE — Telephone Encounter (Signed)
Called patient's daughter back about her appointment with Dr. Acie Fredrickson. Patient's daughter wants to see Dr. Arta Bruce or Dr. Fletcher Anon, because her mother saw them in the hospital. Informed patient's daughter that according to notes on 04/21/15 her mother needs to be seen soon. Since Dr. Arta Bruce or Dr. Fletcher Anon did not have any open appointments, patient was scheduled with Dr. Acie Fredrickson. Informed patient's daughter that Dr. Acie Fredrickson is a wonderful doctor and the patient should keep this appointment due to the events on 04/21/15. Informed patient that this message would be sent to Dr. Ron Parker and his nurse Jeani Hawking. Informed patient's daughter that if the cardiologist doesn't recommend for her to be seen next month, then it would be unnecessary to keep her September appointment with Dr. Ron Parker.

## 2015-04-25 NOTE — Telephone Encounter (Signed)
Per Dr Ron Parker the pts daughter is advised that they may keep the scheduled appt with Dr Ron Parker on 05/26/15 since it would be the pts last OV with Dr Ron Parker before he retires at the end of September. The pts daughter, [name], states that if they decide that they are not going to keep that appointment (after the pt sees Dr Acie Fredrickson on 8/16) they will call and cancel.

## 2015-04-25 NOTE — Telephone Encounter (Signed)
I notified the patient's daughter- [name] (on [place]) that Dr. Rockey Situ had reviewed the patient's EKG from [facility] [place] and no changes have been noted.  The patient's daughter is aware of follow up scheduled with Dr. Acie Fredrickson on 8/16 at 3:15 pm.

## 2015-04-25 NOTE — Telephone Encounter (Signed)
[name],   Please call me about this situation.  Call me as soon as you can.

## 2015-04-29 ENCOUNTER — Ambulatory Visit (INDEPENDENT_AMBULATORY_CARE_PROVIDER_SITE_OTHER): Payer: Commercial Managed Care - HMO | Admitting: Cardiovascular Disease

## 2015-04-29 ENCOUNTER — Encounter: Payer: Self-pay | Admitting: Cardiovascular Disease

## 2015-04-29 ENCOUNTER — Encounter (INDEPENDENT_AMBULATORY_CARE_PROVIDER_SITE_OTHER): Payer: Self-pay

## 2015-04-29 VITALS — BP 120/72 | HR 84 | Ht 67.0 in | Wt 126.0 lb

## 2015-04-29 DIAGNOSIS — I482 Chronic atrial fibrillation, unspecified: Secondary | ICD-10-CM

## 2015-04-29 DIAGNOSIS — I4891 Unspecified atrial fibrillation: Secondary | ICD-10-CM | POA: Diagnosis not present

## 2015-04-29 DIAGNOSIS — I5022 Chronic systolic (congestive) heart failure: Secondary | ICD-10-CM

## 2015-04-29 LAB — PROTIME-INR
INR: 2.61
Prothrombin Time: 28 seconds — ABNORMAL HIGH (ref 11.4–15.0)

## 2015-04-29 NOTE — Patient Instructions (Signed)
Medication Instructions:  Your physician recommends that you continue on your current medications as directed. Please refer to the Current Medication list given to you today.   Labwork: none  Testing/Procedures: none  Follow-Up: Your physician recommends that you schedule a follow-up appointment in: three months with Dr. Rockey Situ   Any Other Special Instructions Will Be Listed Below (If Applicable).

## 2015-04-29 NOTE — Progress Notes (Signed)
Cardiology Office Note   Date:  04/29/2015   ID:  [name], DOB 11-12-1921, MRN [id number]  PCP:  Loura Pardon, MD  Cardiologist:  Ewing Schlein    Chief Complaint  Patient presents with  . other    Former patient of Dr. Ron Parker; pt. was at [facility] in July with chest pain & nausea. Meds reviewed by the patient's med list.      Problem list: 1. Chronic atrial fibrillation 2. History of coronary artery disease-status post stenting in 2004 3.  hemetemesis ( July , 2016)  4. Chronic systolic CHF - EF 74% 5. Moderate pulmonary hypertension     History of Present Illness: [name] is a 81 y.o. female who presents for follow up for her chronic systolic CHF and atrial fib. She had presented with hemeatemesis.   Echo shows EF 30-35%  Walks with a walker.  No  PND or orthopnea Sometimes raises the head of the bed .   Has chronic afib.  Has failed cardioversion several times.    Past Medical History  Diagnosis Date  . Chronic atrial fibrillation     a. on Coumadin; b. Holter 06/2010, no brady, mild tachy  . CAD (coronary artery disease)     a. 2 DES, [facility], 2004; b. nuc 2013 septal dyssenergy 2/2 LBBB, nl LV fxn  . Pleural effusion associated with pulmonary infection 03-2007  . Hypothyroid   . Hyperlipidemia   . GERD (gastroesophageal reflux disease)   . Diverticulosis of colon   . Allergy   . Anemia   . Arthritis     osteo  . Hypertension   . COPD (chronic obstructive pulmonary disease)   . Mitral regurgitation   . Vitamin B deficiency   . Compression fracture of lumbar vertebra 05-2008  . Osteoporosis   . Thrombocytopenia   . SOB (shortness of breath)   . OA (osteoarthritis)   . LBBB (left bundle branch block)   . Hx of colonoscopy   . Warfarin anticoagulation   . TR (tricuspid regurgitation)     moderate, echo 2008  . Dyslipidemia   . Lung abnormality     Dr [name] 122/2011 no further w/u needed  . Prolonged QT interval     when on sotolol in past   . Volume overload     intermittant  . Hypertension   . Weight loss     August, 2012  . Nausea     Some nausea with medications, May, 2013,  . Orthostatic hypotension     June, 2014  . Shingles     Past Surgical History  Procedure Laterality Date  . Thyroidectomy  1962  . Tonsillectomy       Current Outpatient Prescriptions  Medication Sig Dispense Refill  . acetaminophen (TYLENOL) 325 MG tablet Take 650 mg by mouth every 6 (six) hours as needed.    . calcium carbonate (OS-CAL) 600 MG TABS tablet Take 1,200 mg by mouth at bedtime.    . ferrous sulfate 325 (65 FE) MG tablet Take 325 mg by mouth daily. With lunch    . fluticasone (VERAMYST) 27.5 MCG/SPRAY nasal spray Place 2 sprays into the nose daily.    . furosemide (LASIX) 40 MG tablet Take 1 tablet daily as needed for increased weight, edema and SOB 30 tablet 0  . Glucosamine HCl 500 MG TABS Take 1 tablet by mouth daily.    [name] lisinopril (PRINIVIL,ZESTRIL) 2.5 MG tablet Take 1 tablet (2.5 mg total)  by mouth at bedtime. 30 tablet 0  . Lutein 6 MG CAPS Take 1 capsule by mouth daily.     . metoprolol tartrate (LOPRESSOR) 25 MG tablet Take 25 mg by mouth 2 (two) times daily.    . midodrine (PROAMATINE) 5 MG tablet TAKE ONE TABLET BY MOUTH TWICE DAILY WITH A MEAL (Patient taking differently: TAKE ONE TABLET BY MOUTH DAILY with breakfast and lunch) 180 tablet 0  . Multiple Vitamin (MULTIVITAMIN) tablet Take 1 tablet by mouth daily.     . ondansetron (ZOFRAN-ODT) 4 MG disintegrating tablet Take 4 mg by mouth once.    . pantoprazole (PROTONIX) 40 MG tablet Take 1 tablet (40 mg total) by mouth daily. 90 tablet 3  . potassium chloride (K-DUR,KLOR-CON) 10 MEQ tablet TAKE 1 TABLET EVERY DAY (Patient taking differently: TAKE 1 TABLET EVERY DAY with lunch) 90 tablet 0  . senna-docusate (SENOKOT-S) 8.6-50 MG per tablet Take 1 tablet by mouth at bedtime.    . simvastatin (ZOCOR) 5 MG tablet Take 1 tablet (5 mg total) by mouth every evening.      . tobramycin-dexamethasone (TOBRADEX) ophthalmic ointment Place 1 application into the right ear 2 (two) times daily.     [name] warfarin (COUMADIN) 7.5 MG tablet Take 7.5 mg by mouth daily.     No current facility-administered medications for this visit.    Allergies:   Sulfonamide derivatives and Alendronate sodium    Social History:  The patient  reports that she quit smoking about 67 years ago. She has never used smokeless tobacco. She reports that she does not drink alcohol or use illicit drugs.   Family History:  The patient's family history includes Cancer in her brother; Diabetes in her brother; Heart disease in her sister; Heart disease (age of onset: 19) in her mother; Leukemia in her sister; Stroke (age of onset: 76) in her father.    ROS:  Please see the history of present illness.    Review of Systems: Constitutional:  denies fever, chills, diaphoresis, appetite change and fatigue.  HEENT: denies photophobia, eye pain, redness, hearing loss, ear pain, congestion, sore throat, rhinorrhea, sneezing, neck pain, neck stiffness and tinnitus.  Respiratory: denies SOB, DOE, cough, chest tightness, and wheezing.  Cardiovascular: denies chest pain, palpitations and leg swelling.  Gastrointestinal: denies nausea, vomiting, abdominal pain, diarrhea, constipation, blood in stool.  Genitourinary: denies dysuria, urgency, frequency, hematuria, flank pain and difficulty urinating.  Musculoskeletal: denies  myalgias, back pain, joint swelling, arthralgias and gait problem.   Skin: denies pallor, rash and wound.  Neurological: denies dizziness, seizures, syncope, weakness, light-headedness, numbness and headaches.   Hematological: denies adenopathy, easy bruising, personal or family bleeding history.  Psychiatric/ Behavioral: denies suicidal ideation, mood changes, confusion, nervousness, sleep disturbance and agitation.       All other systems are reviewed and negative.    PHYSICAL  EXAM: VS:  BP 120/72 mmHg  Pulse 84  Ht 5\' 7"  (1.702 m)  Wt 57.153 kg (126 lb)  BMI 19.73 kg/m2 , BMI Body mass index is 19.73 kg/(m^2). GEN: Well nourished, well developed, in no acute distress HEENT: normal Neck: no JVD, carotid bruits, or masses Cardiac: Irreg. Irreg ; soft systolic murmur, no rubs, or gallops,no edema  Respiratory:  clear to auscultation bilaterally, normal work of breathing GI: soft, nontender, nondistended, + BS MS: no deformity or atrophy Skin: warm and dry, no rash Neuro:  Strength and sensation are intact Psych: normal   EKG:  EKG is ordered today.  The ekg ordered today demonstrates atrial fib with controlled v response   Recent Labs: 04/07/2015: B Natriuretic Peptide 779.0* 04/08/2015: TSH 3.756 04/10/2015: Hemoglobin 11.7*; Platelets 109* 04/12/2015: BUN 27*; Creatinine, Ser 1.32*; Potassium 3.8; Sodium 139    Lipid Panel    Component Value Date/Time   CHOL 133 09/17/2013 0950   TRIG 71.0 09/17/2013 0950   HDL 51.50 09/17/2013 0950   CHOLHDL 3 09/17/2013 0950   VLDL 14.2 09/17/2013 0950   LDLCALC 67 09/17/2013 0950      Wt Readings from Last 3 Encounters:  04/29/15 57.153 kg (126 lb)  04/12/15 58.378 kg (128 lb 11.2 oz)  03/13/15 58.06 kg (128 lb)      Other studies Reviewed: Additional studies/ records that were reviewed today include: . Review of the above records demonstrates:    ASSESSMENT AND PLAN:  1.  Chronic systolic congestive heart failure';  still has a history of chronic systolic congestive heart failure. She has moderate hypokinesis of the anterior wall that is possible that she may have had a previous antral wall myocardial infarction. The abdomen and he seen on echo may also be due to her left bundle branch block.  She's currently 81 years old and is quite frail. I do not think that she needs any additional workup for this wall motion abnormalities or  the congestive heart failure. She appears to be very comfortable. I  would continue with her current medications  2.  Left bundle-branch block: Stable  3. Chronic atrial fibrillation:  Her rate is well-controlled. She continues on Coumadin. She had one episode of hematemesis several weeks ago which resulted in a hospitalization.  She's not had any further evidence of bleeding. Continue current medications.  4. Moderate pulmonary hypertension:  She is current on Lasix which should help reduce her pulmonary hypertension. She's 81 years old and is not very active. I do not think that there is any reversible cause for her pulmonary hypertension and we will continue with conservative treatment.   Current medicines are reviewed at length with the patient today.  The patient does not have concerns regarding medicines.  The following changes have been made:  no change  Labs/ tests ordered today include:  Orders Placed This Encounter  Procedures  . EKG 12-Lead     Disposition:   FU with Dr. Rockey Situ in 3 months .      [name], Wonda Cheng, MD  04/29/2015 4:08 PM    [facility] [place], [place], [place]  [address] Phone: [phone]; Fax: [phone]   [facility]  [address] [place], [place]  [address] [phone]   Fax [phone]

## 2015-05-06 DIAGNOSIS — I4891 Unspecified atrial fibrillation: Secondary | ICD-10-CM | POA: Diagnosis not present

## 2015-05-06 LAB — PROTIME-INR
INR: 2.74
PROTHROMBIN TIME: 29.1 s — AB (ref 11.4–15.0)

## 2015-05-06 NOTE — Telephone Encounter (Signed)
Transition Care Management Follow-up Telephone Call   Date discharged? 05/06/15   How have you been since you were released from the hospital? Improving   Do you understand why you were in the hospital? yes   Do you understand the discharge instructions? yes   Where were you discharged to? Home from rehab   Items Reviewed:  Medications reviewed: yes  Allergies reviewed: yes  Dietary changes reviewed: no  Referrals reviewed: no   Functional Questionnaire:   Activities of Daily Living (ADLs):   She states they are independent in the following: bathing and hygiene, feeding, continence, grooming, toileting and dressing States they require assistance with the following: ambulation   Any transportation issues/concerns?: no   Any patient concerns? no   Confirmed importance and date/time of follow-up visits scheduled yes, 05/12/15 @ 1530  Provider Appointment booked with Loura Pardon, MD (and anti-coag clinic)  Confirmed with patient if condition begins to worsen call PCP or go to the ER.  Patient was given the office number and encouraged to call back with question or concerns.  : yes

## 2015-05-12 ENCOUNTER — Encounter: Payer: Self-pay | Admitting: Family Medicine

## 2015-05-12 ENCOUNTER — Ambulatory Visit (INDEPENDENT_AMBULATORY_CARE_PROVIDER_SITE_OTHER): Payer: Medicare HMO | Admitting: *Deleted

## 2015-05-12 ENCOUNTER — Ambulatory Visit (INDEPENDENT_AMBULATORY_CARE_PROVIDER_SITE_OTHER): Payer: Medicare HMO | Admitting: Family Medicine

## 2015-05-12 VITALS — BP 102/60 | HR 78 | Temp 97.8°F | Ht 67.0 in | Wt 125.1 lb

## 2015-05-12 DIAGNOSIS — Z23 Encounter for immunization: Secondary | ICD-10-CM | POA: Diagnosis not present

## 2015-05-12 DIAGNOSIS — I10 Essential (primary) hypertension: Secondary | ICD-10-CM

## 2015-05-12 DIAGNOSIS — I482 Chronic atrial fibrillation, unspecified: Secondary | ICD-10-CM

## 2015-05-12 DIAGNOSIS — R531 Weakness: Secondary | ICD-10-CM

## 2015-05-12 DIAGNOSIS — Z7901 Long term (current) use of anticoagulants: Secondary | ICD-10-CM

## 2015-05-12 DIAGNOSIS — E785 Hyperlipidemia, unspecified: Secondary | ICD-10-CM | POA: Diagnosis not present

## 2015-05-12 DIAGNOSIS — R627 Adult failure to thrive: Secondary | ICD-10-CM

## 2015-05-12 DIAGNOSIS — I5022 Chronic systolic (congestive) heart failure: Secondary | ICD-10-CM

## 2015-05-12 DIAGNOSIS — I872 Venous insufficiency (chronic) (peripheral): Secondary | ICD-10-CM

## 2015-05-12 LAB — URINE CULTURE: Culture: NO GROWTH

## 2015-05-12 LAB — POCT INR: INR: 3.3

## 2015-05-12 NOTE — Assessment & Plan Note (Signed)
Enc her to continue wearing support stockings during the day  Has some venous stasis hyperpigmentation and dermatitis Will continue to follow

## 2015-05-12 NOTE — Assessment & Plan Note (Signed)
In pt with hx of isch cardiomyopathy Lab today  On statin  Good diet /eats what she tolerates

## 2015-05-12 NOTE — Assessment & Plan Note (Signed)
Pt was started on mirtazapine 7.5 mg at nursing home for appetite She is quite sleepy however - she may decide to stop it - will disc with family and decide

## 2015-05-12 NOTE — Progress Notes (Signed)
Subjective:    Patient ID: [name], female    DOB: [birth year], 81 y.o.   MRN: [id number]  HPI Here for f/u of hospitalization  7/25-7/30 Had hematemesis (on coumadin) and also bleeding in R ear canal (does not know how cut in ear happened)   CP- r/o unstable angina CHF in setting of cardiomyopathy (ischemic) as well as pulmonary hypertension  (EF of 30-35% with wall motion abn ant/septal, and chronic a fib )  Was d/c to skilled nursing for complex med necessity and weakness  Had f/u with Dr Cathie Olden on 8/16 Ace low dose was added at her hosp and the metoprolol cut  Also lasix  (stopped in the nursing home)  Given midodrine to keep bp from getting  Too low   Will see Dr Rockey Situ   Is back home from skilled nursing  Did PT in nursing home  Still pretty weak   Is on mirtazapine 7.5 mg daily (for sleep and appetite)  Appetite is not "too good" but drinking ensure 2 times per day in addition to meals   Wt is down is down 1lb with bmi of 19  Has continued daily weights at home  Sitter every day from 8-12:30 very helpful , and then another one for lunch shift - then she takes a nap and switches back to first sitter   (but then alone at night) Doing a lot of walking at home also  [facility] for [facility] and OT , and perhaps speech tx to get voice louder    Has f/u with Dr Pryor Ochoa  Put her on ear wax softening drops and also did hearing test (f/u 6 mo)   Wants her flu shot today  Is hard to get back out   May qualify to get aid in attendance from the [place]- gave me a form to fill out   Patient Active Problem List   Diagnosis Date Noted  . Generalized weakness 05/12/2015  . Chronic systolic CHF (congestive heart failure) 04/29/2015  . Pain in the chest   . Cardiomyopathy, ischemic   . Acute systolic CHF (congestive heart failure)   . Pulmonary hypertension   . Adult failure to thrive   . Chronic atrial fibrillation   . Unstable angina  04/07/2015  . Right sided sciatica 10/25/2014  . Venous stasis dermatitis 06/19/2014  . Varicose veins of lower extremities with inflammation 03/25/2014  . Cellulitis 03/08/2014  . Chronic diastolic CHF (congestive heart failure) 01/04/2014  . Knee pain 08/27/2013  . Cough 07/03/2013  . Acute bronchitis 07/03/2013  . Orthostatic hypotension   . Acute on chronic combined systolic and diastolic heart failure 12/87/8676  . Acute renal failure 02/28/2013  . Dehydration 02/28/2013  . Shingles 02/28/2013  . Protein-calorie malnutrition, severe 02/28/2013  . Nail fungus 01/10/2013  . Productive cough 02/29/2012  . Nausea   . Left shoulder pain 10/06/2011  . Lumbar pain 10/06/2011  . Hyperlipidemia 07/09/2011  . Vision blurred 07/09/2011  . Hearing loss 07/09/2011  . Weight loss   . A-fib   . Tachycardia   . CAD (coronary artery disease)   . Mitral regurgitation   . SOB (shortness of breath)   . LBBB (left bundle branch block)   . Warfarin anticoagulation   . Ejection fraction   . TR (tricuspid regurgitation)   . Dyslipidemia   . Lung abnormality   . Prolonged QT interval   . Volume overload   .  Coccydynia 12/03/2010  . Nonspecific (abnormal) findings on radiological and other examination of body structure 07/13/2010  . ABNORMAL CHEST XRAY 07/13/2010  . Shortness of breath 06/18/2010  . Essential hypertension 06/24/2009  . GOUT, UNSPECIFIED 03/20/2009  . GERD 03/18/2009  . THROMBOCYTOPENIA 01/03/2009  . ALLERGIC RHINITIS 12/05/2008  . HOARSENESS 06/07/2008  . MEDIASTINAL LYMPHADENOPATHY 05/13/2008  . Other diseases of lung, not elsewhere classified 04/18/2008  . Venous (peripheral) insufficiency 09/13/2007  . FREQUENCY, URINARY 07/19/2007  . VERTIGO, BENIGN PAROXYSMAL POSITION 05/11/2007  . TINNITUS NOS 05/11/2007  . OSTEOPOROSIS NOS 05/11/2007  . HYPOTHYROIDISM 04/11/2007  . ANEMIA, B12 DEFICIENCY 04/11/2007  . HYPOXEMIA 04/11/2007   Past Medical History  Diagnosis  Date  . Chronic atrial fibrillation     a. on Coumadin; b. Holter 06/2010, no brady, mild tachy  . CAD (coronary artery disease)     a. 2 DES, [facility], 2004; b. nuc 2013 septal dyssenergy 2/2 LBBB, nl LV fxn  . Pleural effusion associated with pulmonary infection 03-2007  . Hypothyroid   . Hyperlipidemia   . GERD (gastroesophageal reflux disease)   . Diverticulosis of colon   . Allergy   . Anemia   . Arthritis     osteo  . Hypertension   . COPD (chronic obstructive pulmonary disease)   . Mitral regurgitation   . Vitamin B deficiency   . Compression fracture of lumbar vertebra 05-2008  . Osteoporosis   . Thrombocytopenia   . SOB (shortness of breath)   . OA (osteoarthritis)   . LBBB (left bundle branch block)   . Hx of colonoscopy   . Warfarin anticoagulation   . TR (tricuspid regurgitation)     moderate, echo 2008  . Dyslipidemia   . Lung abnormality     Dr [name] 122/2011 no further w/u needed  . Prolonged QT interval     when on sotolol in past  . Volume overload     intermittant  . Hypertension   . Weight loss     August, 2012  . Nausea     Some nausea with medications, May, 2013,  . Orthostatic hypotension     June, 2014  . Shingles    Past Surgical History  Procedure Laterality Date  . Thyroidectomy  1962  . Tonsillectomy     Social History  Substance Use Topics  . Smoking status: Former Smoker    Quit date: 09/14/1947  . Smokeless tobacco: Never Used  . Alcohol Use: No   Family History  Problem Relation Age of Onset  . Heart disease Mother 4  . Stroke Father 66  . Heart disease Sister   . Cancer Brother     colon  . Diabetes Brother   . Leukemia Sister    Allergies  Allergen Reactions  . Sulfonamide Derivatives Nausea And Vomiting    REACTION: sick  . Alendronate Sodium Other (See Comments)    REACTION: GI   Current Outpatient Prescriptions on File Prior to Visit  Medication Sig Dispense Refill  . acetaminophen (TYLENOL) 325 MG tablet Take  650 mg by mouth every 6 (six) hours as needed.    . calcium carbonate (OS-CAL) 600 MG TABS tablet Take 1,200 mg by mouth at bedtime.    . ferrous sulfate 325 (65 FE) MG tablet Take 325 mg by mouth daily. With lunch    . fluticasone (VERAMYST) 27.5 MCG/SPRAY nasal spray Place 2 sprays into the nose daily.    . Glucosamine HCl 500 MG TABS Take  1 tablet by mouth daily.    [name] lisinopril (PRINIVIL,ZESTRIL) 2.5 MG tablet Take 1 tablet (2.5 mg total) by mouth at bedtime. 30 tablet 0  . Lutein 6 MG CAPS Take 1 capsule by mouth daily.     . metoprolol tartrate (LOPRESSOR) 25 MG tablet Take 25 mg by mouth 2 (two) times daily.    . midodrine (PROAMATINE) 5 MG tablet TAKE ONE TABLET BY MOUTH TWICE DAILY WITH A MEAL (Patient taking differently: TAKE ONE TABLET BY MOUTH DAILY with breakfast and lunch) 180 tablet 0  . Multiple Vitamin (MULTIVITAMIN) tablet Take 1 tablet by mouth daily.     . ondansetron (ZOFRAN-ODT) 4 MG disintegrating tablet Take 4 mg by mouth once.    . pantoprazole (PROTONIX) 40 MG tablet Take 1 tablet (40 mg total) by mouth daily. 90 tablet 3  . potassium chloride (K-DUR,KLOR-CON) 10 MEQ tablet TAKE 1 TABLET EVERY DAY (Patient taking differently: TAKE 1 TABLET EVERY DAY with lunch) 90 tablet 0  . senna-docusate (SENOKOT-S) 8.6-50 MG per tablet Take 1 tablet by mouth at bedtime.    . simvastatin (ZOCOR) 5 MG tablet Take 1 tablet (5 mg total) by mouth every evening.    . warfarin (COUMADIN) 7.5 MG tablet Take 7.5 mg by mouth daily.    . furosemide (LASIX) 40 MG tablet Take 1 tablet daily as needed for increased weight, edema and SOB 30 tablet 0   No current facility-administered medications on file prior to visit.     Review of Systems    Review of Systems  Constitutional: Negative for fever, appetite change, and unexpected weight change. pos for sleepiness and generalized weakness from deconditioning  Eyes: Negative for pain and visual disturbance.  ENT pos for hearing impaired    Respiratory: Negative for cough and shortness of breath.   Cardiovascular: Negative for cp or palpitations   neg for PND or orthopnea  Gastrointestinal: Negative for nausea, diarrhea and constipation.  Genitourinary: Negative for urgency and frequency. (has baseline small volume urination with neg ua in hospital) Skin: Negative for pallor or rash   Neurological: Negative for weakness, light-headedness, numbness and headaches.  Hematological: Negative for adenopathy. Does not bruise/bleed easily.  Psychiatric/Behavioral: Negative for dysphoric mood. The patient is not nervous/anxious.      Objective:   Physical Exam  Constitutional: She appears well-developed and well-nourished. No distress.  Frail appearing underwt elderly female in wheelchair -sleepy but mentally sharp  HENT:  Head: Normocephalic and atraumatic.  Mouth/Throat: Oropharynx is clear and moist.  Eyes: Conjunctivae and EOM are normal. Pupils are equal, round, and reactive to light. Right eye exhibits no discharge. Left eye exhibits no discharge. No scleral icterus.  Neck: Normal range of motion. Neck supple. No JVD present. Carotid bruit is not present. No thyromegaly present.  Cardiovascular: Normal rate, normal heart sounds and intact distal pulses.  Exam reveals no gallop.   irreg irreg rhythm  Pulmonary/Chest: Effort normal and breath sounds normal. No respiratory distress. She has no wheezes. She has no rales.  No crackles  Abdominal: Soft. Bowel sounds are normal. She exhibits no distension, no abdominal bruit and no mass. There is no tenderness.  Musculoskeletal: She exhibits no edema or tenderness.  Lymphadenopathy:    She has no cervical adenopathy.  Neurological: She is alert. She has normal reflexes.  Skin: Skin is warm and dry. No rash noted.  Hyperpigmentation on lower legs with some venous stasis changes   Psychiatric: She has a normal mood and affect.  Sleepy but answers questions appropriately           Assessment & Plan:   Problem List Items Addressed This Visit    A-fib - Primary    Continues coumadin  Rate controlled       Adult failure to thrive    Pt was started on mirtazapine 7.5 mg at nursing home for appetite She is quite sleepy however - she may decide to stop it - will disc with family and decide       Chronic systolic CHF (congestive heart failure)    Rev records and studies from recent hosp  EF 30-35% Doing well so far/has had one cardiol visit , next f/u in Nov  Unsure if lasix is daily or prn - she is doing daily wt (they will call cardiology to clarify) Continue to follow Lab today for chemistries       Essential hypertension    Watching for both hyper and hypotension On ace for CHF as well as beta blocker for rate control (dosage was dec in hospital)  minodrine to support bp BP Readings from Last 3 Encounters:  05/12/15 102/60  04/29/15 120/72  04/12/15 115/71    So far stable without orthostatic symptoms  Will continue to follow along with cardiology      Relevant Orders   CBC with Differential/Platelet   Comprehensive metabolic panel   Lipid panel   Generalized weakness    From deconditioning - to begin PT and OT at home after hosp and rehab stay Has short box spring/ lift chair and transfer chair- px written today  Getting stronger       Hyperlipidemia    In pt with hx of isch cardiomyopathy Lab today  On statin  Good diet /eats what she tolerates       Relevant Orders   Lipid panel   Venous (peripheral) insufficiency (Chronic)    Enc her to continue wearing support stockings during the day  Has some venous stasis hyperpigmentation and dermatitis Will continue to follow        Other Visit Diagnoses    Need for influenza vaccination        Relevant Orders    Flu Vaccine QUAD 36+ mos PF IM (Fluarix & Fluzone Quad PF) (Completed)

## 2015-05-12 NOTE — Assessment & Plan Note (Signed)
Watching for both hyper and hypotension On ace for CHF as well as beta blocker for rate control (dosage was dec in hospital)  minodrine to support bp BP Readings from Last 3 Encounters:  05/12/15 102/60  04/29/15 120/72  04/12/15 115/71    So far stable without orthostatic symptoms  Will continue to follow along with cardiology

## 2015-05-12 NOTE — Assessment & Plan Note (Signed)
Rev records and studies from recent hosp  EF 30-35% Doing well so far/has had one cardiol visit , next f/u in Nov  Unsure if lasix is daily or prn - she is doing daily wt (they will call cardiology to clarify) Continue to follow Lab today for chemistries

## 2015-05-12 NOTE — Patient Instructions (Addendum)
It sounds like you will probably only need lasix if you gain weight rapidly or if you become short of breath or develop worse swelling - please call the cardiology office to see how your cardiologist wants you to take it (as needed or daily)  Do continue the support stockings to slow down the skin changes (from poor venous return) in legs  If the mirtazapine makes you too sleepy (and does not help your appetite)- you can try stopping it  Proceed with PT/ OT and speech therapy if needed  Please let us know if nausea persists  BP is stable  Follow up with Dr Rockey Situ when it is time  Flu shot today   Labs and protime today

## 2015-05-12 NOTE — Assessment & Plan Note (Signed)
Continues coumadin  Rate controlled

## 2015-05-12 NOTE — Assessment & Plan Note (Signed)
From deconditioning - to begin PT and OT at home after hosp and rehab stay Has short box spring/ lift chair and transfer chair- px written today  Getting stronger

## 2015-05-12 NOTE — Progress Notes (Signed)
Pre visit review using our clinic review tool, if applicable. No additional management support is needed unless otherwise documented below in the visit note. 

## 2015-05-13 ENCOUNTER — Telehealth: Payer: Self-pay

## 2015-05-13 LAB — CBC WITH DIFFERENTIAL/PLATELET
BASOS PCT: 0.3 % (ref 0.0–3.0)
Basophils Absolute: 0 10*3/uL (ref 0.0–0.1)
EOS ABS: 0.2 10*3/uL (ref 0.0–0.7)
Eosinophils Relative: 2.6 % (ref 0.0–5.0)
HCT: 37.9 % (ref 36.0–46.0)
Hemoglobin: 12.6 g/dL (ref 12.0–15.0)
Lymphocytes Relative: 26.5 % (ref 12.0–46.0)
Lymphs Abs: 1.5 10*3/uL (ref 0.7–4.0)
MCHC: 33.1 g/dL (ref 30.0–36.0)
MCV: 96.4 fl (ref 78.0–100.0)
MONO ABS: 0.7 10*3/uL (ref 0.1–1.0)
Monocytes Relative: 11.7 % (ref 3.0–12.0)
NEUTROS ABS: 3.4 10*3/uL (ref 1.4–7.7)
Neutrophils Relative %: 58.9 % (ref 43.0–77.0)
PLATELETS: 104 10*3/uL — AB (ref 150.0–400.0)
RBC: 3.93 Mil/uL (ref 3.87–5.11)
RDW: 15.7 % — AB (ref 11.5–15.5)
WBC: 5.8 10*3/uL (ref 4.0–10.5)

## 2015-05-13 LAB — COMPREHENSIVE METABOLIC PANEL
ALT: 12 U/L (ref 0–35)
AST: 22 U/L (ref 0–37)
Albumin: 4 g/dL (ref 3.5–5.2)
Alkaline Phosphatase: 57 U/L (ref 39–117)
BUN: 31 mg/dL — AB (ref 6–23)
CHLORIDE: 106 meq/L (ref 96–112)
CO2: 30 meq/L (ref 19–32)
CREATININE: 1.02 mg/dL (ref 0.40–1.20)
Calcium: 9.3 mg/dL (ref 8.4–10.5)
GFR: 53.91 mL/min — ABNORMAL LOW (ref >60.00)
GLUCOSE: 82 mg/dL (ref 70–99)
Potassium: 5 mEq/L (ref 3.5–5.1)
SODIUM: 141 meq/L (ref 135–145)
Total Bilirubin: 0.7 mg/dL (ref 0.2–1.2)
Total Protein: 6.3 g/dL (ref 6.0–8.3)

## 2015-05-13 LAB — LIPID PANEL
CHOL/HDL RATIO: 3
Cholesterol: 147 mg/dL (ref 0–200)
HDL: 46 mg/dL (ref >39.00)
LDL CALC: 83 mg/dL (ref 0–99)
NonHDL: 101.2
Triglycerides: 91 mg/dL (ref 0.0–149.0)
VLDL: 18.2 mg/dL (ref 0.0–40.0)

## 2015-05-13 NOTE — Telephone Encounter (Signed)
Pt of Dr. Acie Fredrickson.

## 2015-05-13 NOTE — Telephone Encounter (Signed)
Pt daughter called, states Lasix was not listed on her meds when she left rehab. States Dr. Glori Bickers advised her to call Dr. Rockey Situ to see when she should take it. States she left rehab on 8/23. States she has not taken this since she left there. Please call

## 2015-05-13 NOTE — Telephone Encounter (Signed)
Left message on machine for patient daughter to contact the office.

## 2015-05-14 ENCOUNTER — Telehealth: Payer: Self-pay | Admitting: *Deleted

## 2015-05-14 MED ORDER — MIRTAZAPINE 7.5 MG PO TABS: 7.5000 mg | ORAL_TABLET | Freq: Every day | ORAL | Status: DC

## 2015-05-14 MED ORDER — LISINOPRIL 2.5 MG PO TABS: 2.5000 mg | ORAL_TABLET | Freq: Every day | ORAL | Status: DC

## 2015-05-14 NOTE — Telephone Encounter (Signed)
Pt daughter [name] called to inquire whether pt should be taking lasix. States when she was discharged from [facility] 8/23, pt was given list of medications taken at facility; lasix was not on list. Daughter indicates she only had to have lasix once while in rehab. Reviewed lasix dosage and frequency to be used as needed for weight gain, edema, SOB. Informed daughter that at last 8/16 [name], he noted to continue current meds. Lasix was included in the list. Reviewed when to give lasix. Daughter states she will only give as needed, verbalized understanding, and expressed appreciation for the information.

## 2015-05-14 NOTE — Telephone Encounter (Signed)
Daughter notified Rx sent to pharmacy  

## 2015-05-14 NOTE — Telephone Encounter (Signed)
I sent those in  At last visit not sure they wanted to continue mirtazapine-sounds like the want to continue so I did send it

## 2015-05-14 NOTE — Telephone Encounter (Signed)
Daughter said while pt was at [facility] Dr. Ouida Sills prescribed pt lisinopril 2.5mg , and Mirtazapine 7.5mg (not on med list). Daughter [name] wanted to ask if you would start prescribing these meds and if possible send in a 90 day supply to her [facility] Mail order pharmcy

## 2015-05-21 ENCOUNTER — Other Ambulatory Visit: Payer: Self-pay

## 2015-05-21 MED ORDER — MIDODRINE HCL 5 MG PO TABS: 5.0000 mg | ORAL_TABLET | Freq: Two times a day (BID) | ORAL | Status: DC

## 2015-05-21 NOTE — Telephone Encounter (Signed)
Pt sees Dr. Ron Parker, but called Dr. Rockey Situ for refill

## 2015-05-22 ENCOUNTER — Other Ambulatory Visit: Payer: Self-pay | Admitting: Family Medicine

## 2015-05-26 ENCOUNTER — Ambulatory Visit: Payer: Commercial Managed Care - HMO | Admitting: Cardiology

## 2015-05-26 ENCOUNTER — Other Ambulatory Visit: Payer: Self-pay | Admitting: Cardiology

## 2015-05-27 ENCOUNTER — Other Ambulatory Visit: Payer: Self-pay | Admitting: *Deleted

## 2015-05-27 ENCOUNTER — Telehealth: Payer: Self-pay

## 2015-05-27 MED ORDER — FUROSEMIDE 20 MG PO TABS: 20.0000 mg | ORAL_TABLET | Freq: Every day | ORAL | Status: DC | PRN

## 2015-05-27 MED ORDER — FLUTICASONE PROPIONATE 50 MCG/ACT NA SUSP: 2.0000 | Freq: Every day | NASAL | Status: DC

## 2015-05-27 NOTE — Telephone Encounter (Signed)
Fax refill request, not on med list, please advise

## 2015-05-27 NOTE — Telephone Encounter (Signed)
Called daughter to advise her not to have pt take flonase and veramyst but no answer so left voicemail requesting pt's daughter to call office back

## 2015-05-27 NOTE — Telephone Encounter (Signed)
S/w pt daughter [name] who states she received call from [facility] that lasix refill ready for pick up. Asking who requested refill. Informed daughter that it was computer generated from [facility] but we will send refill where pt would like. Daughter requests 90 day refill sent to [facility] as there is no copay. Pt has not needed PRN lasix recently and reports weight has remained stable, 120-125lbs, without taking lasix. Reviewed when pt should take lasix. Daughter verbalized understanding with no further questions. Refill sent to [facility]

## 2015-05-27 NOTE — Telephone Encounter (Signed)
Please refill for a year -as long as she is not using the veramyst (on her med list)-they are in the same category

## 2015-05-27 NOTE — Telephone Encounter (Signed)
Daughter notified not go give pt veramyst and flonase, daughter will trash any veramyst she has and Rx for flonase sent to pharmacy

## 2015-05-27 NOTE — Telephone Encounter (Signed)
Pt daughter called, states that she received a call from [facility] to get pt Lasix, she is unaware that pt needed this refilled. STates she usually uses [facility]. Please call.

## 2015-05-28 NOTE — Telephone Encounter (Signed)
Erroneous entry

## 2015-05-29 ENCOUNTER — Ambulatory Visit (INDEPENDENT_AMBULATORY_CARE_PROVIDER_SITE_OTHER): Payer: Medicare HMO | Admitting: *Deleted

## 2015-05-29 DIAGNOSIS — Z7901 Long term (current) use of anticoagulants: Secondary | ICD-10-CM | POA: Diagnosis not present

## 2015-05-29 DIAGNOSIS — I482 Chronic atrial fibrillation, unspecified: Secondary | ICD-10-CM

## 2015-05-29 LAB — POCT INR: INR: 3.2

## 2015-05-29 NOTE — Progress Notes (Signed)
Pre visit review using our clinic review tool, if applicable. No additional management support is needed unless otherwise documented below in the visit note. 

## 2015-06-09 ENCOUNTER — Ambulatory Visit (INDEPENDENT_AMBULATORY_CARE_PROVIDER_SITE_OTHER): Payer: Commercial Managed Care - HMO | Admitting: *Deleted

## 2015-06-09 ENCOUNTER — Ambulatory Visit (INDEPENDENT_AMBULATORY_CARE_PROVIDER_SITE_OTHER): Payer: Commercial Managed Care - HMO | Admitting: Family Medicine

## 2015-06-09 ENCOUNTER — Encounter: Payer: Self-pay | Admitting: Family Medicine

## 2015-06-09 VITALS — BP 102/60 | HR 74 | Temp 97.5°F | Ht 67.0 in | Wt 130.5 lb

## 2015-06-09 DIAGNOSIS — Z7901 Long term (current) use of anticoagulants: Secondary | ICD-10-CM

## 2015-06-09 DIAGNOSIS — R5382 Chronic fatigue, unspecified: Secondary | ICD-10-CM

## 2015-06-09 DIAGNOSIS — I482 Chronic atrial fibrillation, unspecified: Secondary | ICD-10-CM

## 2015-06-09 DIAGNOSIS — R5383 Other fatigue: Secondary | ICD-10-CM | POA: Insufficient documentation

## 2015-06-09 DIAGNOSIS — I5022 Chronic systolic (congestive) heart failure: Secondary | ICD-10-CM | POA: Diagnosis not present

## 2015-06-09 DIAGNOSIS — F4323 Adjustment disorder with mixed anxiety and depressed mood: Secondary | ICD-10-CM | POA: Diagnosis not present

## 2015-06-09 LAB — CBC WITH DIFFERENTIAL/PLATELET
BASOS ABS: 0 10*3/uL (ref 0.0–0.1)
Basophils Relative: 0.2 % (ref 0.0–3.0)
Eosinophils Absolute: 0.2 10*3/uL (ref 0.0–0.7)
Eosinophils Relative: 2.3 % (ref 0.0–5.0)
HEMATOCRIT: 37.8 % (ref 36.0–46.0)
HEMOGLOBIN: 12.4 g/dL (ref 12.0–15.0)
LYMPHS PCT: 23 % (ref 12.0–46.0)
Lymphs Abs: 1.5 10*3/uL (ref 0.7–4.0)
MCHC: 32.8 g/dL (ref 30.0–36.0)
MCV: 95.8 fl (ref 78.0–100.0)
MONOS PCT: 11.6 % (ref 3.0–12.0)
Monocytes Absolute: 0.8 10*3/uL (ref 0.1–1.0)
NEUTROS PCT: 62.9 % (ref 43.0–77.0)
Neutro Abs: 4.2 10*3/uL (ref 1.4–7.7)
PLATELETS: 137 10*3/uL — AB (ref 150.0–400.0)
RBC: 3.95 Mil/uL (ref 3.87–5.11)
RDW: 15.4 % (ref 11.5–15.5)
WBC: 6.7 10*3/uL (ref 4.0–10.5)

## 2015-06-09 LAB — POCT INR: INR: 2.4

## 2015-06-09 LAB — COMPREHENSIVE METABOLIC PANEL
ALBUMIN: 4 g/dL (ref 3.5–5.2)
ALT: 15 U/L (ref 0–35)
AST: 21 U/L (ref 0–37)
Alkaline Phosphatase: 68 U/L (ref 39–117)
BUN: 29 mg/dL — AB (ref 6–23)
CHLORIDE: 105 meq/L (ref 96–112)
CO2: 26 meq/L (ref 19–32)
CREATININE: 1.05 mg/dL (ref 0.40–1.20)
Calcium: 9.3 mg/dL (ref 8.4–10.5)
GFR: 52.13 mL/min — ABNORMAL LOW (ref >60.00)
GLUCOSE: 82 mg/dL (ref 70–99)
POTASSIUM: 5.4 meq/L — AB (ref 3.5–5.1)
SODIUM: 139 meq/L (ref 135–145)
Total Bilirubin: 0.7 mg/dL (ref 0.2–1.2)
Total Protein: 6.6 g/dL (ref 6.0–8.3)

## 2015-06-09 LAB — VITAMIN B12: VITAMIN B 12: 184 pg/mL — AB (ref 211–911)

## 2015-06-09 MED ORDER — MIRTAZAPINE 15 MG PO TABS: 15.0000 mg | ORAL_TABLET | Freq: Every day | ORAL | Status: DC

## 2015-06-09 NOTE — Assessment & Plan Note (Signed)
Likely multifactorial with deconditioning/ adv age /CHF/ medications and dep/anxiety  Lab today -interested in checking B12 as well  Will f/u with cardiology re: the CHF  Addressed the dep/anx -will inc her mirtazapine dose which tends to be less sedating at higher doses -in hopes of helping anx and appetite as well

## 2015-06-09 NOTE — Patient Instructions (Signed)
Increase mirtazapine to 15 mg once daily at bedtime for anxiety and appetite Stop at check out for referral to cardiology Labs today   Please update me if worse or no improvement

## 2015-06-09 NOTE — Assessment & Plan Note (Signed)
This has worsened lately with worsening health  On mirtazapine which is sedating at current dose  Unsure if helping with appetite  Reviewed stressors/ coping techniques/symptoms/ support sources/ tx options and side effects in detail today  She declines counseling Will inc mirtazapine to 15 mg - to see if it is less sedating (at higher dose ) and if it helps mood and appetite more Discussed expectations of this medication including time to effectiveness and mechanism of action, also poss of side effects (early and late)- including mental fuzziness, weight or appetite change, nausea and poss of worse dep or anxiety (even suicidal thoughts)  Pt voiced understanding and will stop med and update if this occurs   Will follow closely and try another strategy if no improvement

## 2015-06-09 NOTE — Progress Notes (Signed)
Subjective:    Patient ID: [name], female    DOB: 07/29/1922, 81 y.o.   MRN: [id number]  HPI Here for several issues including anxiety       Lab Results  Component Value Date   VITAMINB12 1500* 07/09/2011   Lab Results  Component Value Date   WBC 5.8 05/12/2015   HGB 12.6 05/12/2015   HCT 37.9 05/12/2015   MCV 96.4 05/12/2015   PLT 104.0* 05/12/2015      Chemistry      Component Value Date/Time   NA 141 05/12/2015 1639   NA 144 10/28/2011 1315   NA 144 04/15/2009 1032   K 5.0 05/12/2015 1639   K 4.5 10/28/2011 1315   K 5.7* 04/15/2009 1032   CL 106 05/12/2015 1639   CL 106 10/28/2011 1315   CL 107 04/15/2009 1032   CO2 30 05/12/2015 1639   CO2 29 10/28/2011 1315   CO2 33 04/15/2009 1032   BUN 31* 05/12/2015 1639   BUN 26* 10/28/2011 1315   BUN 15 04/15/2009 1032   CREATININE 1.02 05/12/2015 1639   CREATININE 1.12 10/28/2011 1315   CREATININE 0.9 04/15/2009 1032      Component Value Date/Time   CALCIUM 9.3 05/12/2015 1639   CALCIUM 8.8 10/28/2011 1315   CALCIUM 9.0 04/15/2009 1032   ALKPHOS 57 05/12/2015 1639   ALKPHOS 51 01/28/2009 1252   AST 22 05/12/2015 1639   AST 32 01/28/2009 1252   ALT 12 05/12/2015 1639   ALT 24 01/28/2009 1252   BILITOT 0.7 05/12/2015 1639   BILITOT 0.90 01/28/2009 1252        Review of Systems    Review of Systems  Constitutional: Negative for fever, appetite change,  and unexpected weight change.  Eyes: Negative for pain and visual disturbance.  Respiratory: Negative for cough and pos for shortness of breath.  pos for orthopnea , neg for PND or pedal edema  Cardiovascular: Negative for cp or palpitations   pos for chest pressure  Gastrointestinal: Negative for nausea, diarrhea and constipation.  Genitourinary: Negative for urgency and frequency.  Skin: Negative for pallor or rash   Neurological: Negative for weakness, light-headedness, numbness and headaches.  Hematological: Negative for adenopathy. Does  not bruise/bleed easily.  Psychiatric/Behavioral: pos for symptoms of depression and anxiety as well as fatigue and lack of motivation, neg for SI      Objective:   Physical Exam  Constitutional: She appears well-developed and well-nourished. No distress.  underwt frail appearing female who talks very softly   HENT:  Head: Normocephalic and atraumatic.  Mouth/Throat: Oropharynx is clear and moist.  Eyes: Conjunctivae and EOM are normal. Pupils are equal, round, and reactive to light.  Neck: Normal range of motion. Neck supple. No JVD present. Carotid bruit is not present. No thyromegaly present.  Cardiovascular: Normal rate, normal heart sounds and intact distal pulses.  Exam reveals no gallop.   Pulmonary/Chest: Effort normal and breath sounds normal. No respiratory distress. She has no wheezes. She has no rales.  No crackles  Abdominal: Soft. Bowel sounds are normal. She exhibits no distension, no abdominal bruit and no mass. There is no tenderness.  Musculoskeletal: She exhibits no edema or tenderness.  Lymphadenopathy:    She has no cervical adenopathy.  Neurological: She is alert. She has normal reflexes.  Skin: Skin is warm and dry. No rash noted.  Psychiatric: Her speech is normal. Her mood appears anxious. Her affect is not blunt, not labile  and not inappropriate. She is slowed. She is not agitated. Thought content is not paranoid. She exhibits a depressed mood. She expresses no homicidal and no suicidal ideation.  Pt speaks very softly - and answers questions appropriately          Assessment & Plan:   Problem List Items Addressed This Visit      Cardiovascular and Mediastinum   Chronic systolic CHF (congestive heart failure)    Despite a stable wt (which pt controls with lasix), she feels her CHF symptoms of sob/exercise intolerance/ chest pressure are worsening and she desires sooner f/u  With cardiology  Disc continuing daily wts and pursuing after hours care if  symptoms sharply worsen  Unsure if this is worsening her anxiety       Relevant Orders   Ambulatory referral to Cardiology     Other   Adjustment disorder with mixed anxiety and depressed mood - Primary    This has worsened lately with worsening health  On mirtazapine which is sedating at current dose  Unsure if helping with appetite  Reviewed stressors/ coping techniques/symptoms/ support sources/ tx options and side effects in detail today  She declines counseling Will inc mirtazapine to 15 mg - to see if it is less sedating (at higher dose ) and if it helps mood and appetite more Discussed expectations of this medication including time to effectiveness and mechanism of action, also poss of side effects (early and late)- including mental fuzziness, weight or appetite change, nausea and poss of worse dep or anxiety (even suicidal thoughts)  Pt voiced understanding and will stop med and update if this occurs   Will follow closely and try another strategy if no improvement       Fatigue    Likely multifactorial with deconditioning/ adv age /CHF/ medications and dep/anxiety  Lab today -interested in checking B12 as well  Will f/u with cardiology re: the CHF  Addressed the dep/anx -will inc her mirtazapine dose which tends to be less sedating at higher doses -in hopes of helping anx and appetite as well       Relevant Orders   CBC with Differential/Platelet (Completed)   Comprehensive metabolic panel (Completed)   Vitamin B12 (Completed)   Ambulatory referral to Cardiology

## 2015-06-09 NOTE — Assessment & Plan Note (Signed)
Despite a stable wt (which pt controls with lasix), she feels her CHF symptoms of sob/exercise intolerance/ chest pressure are worsening and she desires sooner f/u  With cardiology  Disc continuing daily wts and pursuing after hours care if symptoms sharply worsen  Unsure if this is worsening her anxiety

## 2015-06-09 NOTE — Progress Notes (Signed)
Subjective:    Patient ID: [name], female    DOB: 09/16/1921, 81 y.o.   MRN: [id number]  HPI When here before or and she had been on mirtazapine 7.5 mg  Still quite anxious   Wt is up 5 lb with bmi of 20   Lab Results  Component Value Date   WBC 5.8 05/12/2015   HGB 12.6 05/12/2015   HCT 37.9 05/12/2015   MCV 96.4 05/12/2015   PLT 104.0* 05/12/2015   wants to re check this  No excessive bleeding or bruising   Some cough  - foamy mucous   Sees Dr Rockey Situ  Has CHF  Interested in a f/u- she continues to be very weak  Some tightness in her chest at times  Gets short of breath when she lies flat   Takes lasix if she gains 2 lb   Very anxious  Very worried - about finances and also about her health     (waiting for word from the [place]) Never been this anxious before   Is sleepy with the mirtazapine   Lab Results  Component Value Date   VITAMINB12 1500* 07/09/2011   she was wondering if her B12 is low   Lab Results  Component Value Date   TSH 3.756 04/08/2015    Patient Active Problem List   Diagnosis Date Noted  . Generalized weakness 05/12/2015  . Chronic systolic CHF (congestive heart failure) 04/29/2015  . Pain in the chest   . Cardiomyopathy, ischemic   . Acute systolic CHF (congestive heart failure)   . Pulmonary hypertension   . Adult failure to thrive   . Chronic atrial fibrillation   . Unstable angina 04/07/2015  . Right sided sciatica 10/25/2014  . Venous stasis dermatitis 06/19/2014  . Varicose veins of lower extremities with inflammation 03/25/2014  . Cellulitis 03/08/2014  . Chronic diastolic CHF (congestive heart failure) 01/04/2014  . Knee pain 08/27/2013  . Cough 07/03/2013  . Acute bronchitis 07/03/2013  . Orthostatic hypotension   . Acute on chronic combined systolic and diastolic heart failure 77/41/2878  . Acute renal failure 02/28/2013  . Dehydration 02/28/2013  . Shingles 02/28/2013  . Protein-calorie malnutrition, severe  02/28/2013  . Nail fungus 01/10/2013  . Productive cough 02/29/2012  . Nausea   . Left shoulder pain 10/06/2011  . Lumbar pain 10/06/2011  . Hyperlipidemia 07/09/2011  . Vision blurred 07/09/2011  . Hearing loss 07/09/2011  . Weight loss   . A-fib   . Tachycardia   . CAD (coronary artery disease)   . Mitral regurgitation   . SOB (shortness of breath)   . LBBB (left bundle branch block)   . Warfarin anticoagulation   . Ejection fraction   . TR (tricuspid regurgitation)   . Dyslipidemia   . Lung abnormality   . Prolonged QT interval   . Volume overload   . Coccydynia 12/03/2010  . Nonspecific (abnormal) findings on radiological and other examination of body structure 07/13/2010  . ABNORMAL CHEST XRAY 07/13/2010  . Shortness of breath 06/18/2010  . Essential hypertension 06/24/2009  . GOUT, UNSPECIFIED 03/20/2009  . GERD 03/18/2009  . THROMBOCYTOPENIA 01/03/2009  . ALLERGIC RHINITIS 12/05/2008  . HOARSENESS 06/07/2008  . MEDIASTINAL LYMPHADENOPATHY 05/13/2008  . Other diseases of lung, not elsewhere classified 04/18/2008  . Venous (peripheral) insufficiency 09/13/2007  . FREQUENCY, URINARY 07/19/2007  . VERTIGO, BENIGN PAROXYSMAL POSITION 05/11/2007  . TINNITUS NOS 05/11/2007  . OSTEOPOROSIS NOS 05/11/2007  . HYPOTHYROIDISM 04/11/2007  .  ANEMIA, B12 DEFICIENCY 04/11/2007  . HYPOXEMIA 04/11/2007   Past Medical History  Diagnosis Date  . Chronic atrial fibrillation     a. on Coumadin; b. Holter 06/2010, no brady, mild tachy  . CAD (coronary artery disease)     a. 2 DES, [facility], 2004; b. nuc 2013 septal dyssenergy 2/2 LBBB, nl LV fxn  . Pleural effusion associated with pulmonary infection 03-2007  . Hypothyroid   . Hyperlipidemia   . GERD (gastroesophageal reflux disease)   . Diverticulosis of colon   . Allergy   . Anemia   . Arthritis     osteo  . Hypertension   . COPD (chronic obstructive pulmonary disease)   . Mitral regurgitation   . Vitamin B deficiency   .  Compression fracture of lumbar vertebra 05-2008  . Osteoporosis   . Thrombocytopenia   . SOB (shortness of breath)   . OA (osteoarthritis)   . LBBB (left bundle branch block)   . Hx of colonoscopy   . Warfarin anticoagulation   . TR (tricuspid regurgitation)     moderate, echo 2008  . Dyslipidemia   . Lung abnormality     Dr [name] 122/2011 no further w/u needed  . Prolonged QT interval     when on sotolol in past  . Volume overload     intermittant  . Hypertension   . Weight loss     August, 2012  . Nausea     Some nausea with medications, May, 2013,  . Orthostatic hypotension     June, 2014  . Shingles    Past Surgical History  Procedure Laterality Date  . Thyroidectomy  1962  . Tonsillectomy     Social History  Substance Use Topics  . Smoking status: Former Smoker    Quit date: 09/14/1947  . Smokeless tobacco: Never Used  . Alcohol Use: No   Family History  Problem Relation Age of Onset  . Heart disease Mother 83  . Stroke Father 4  . Heart disease Sister   . Cancer Brother     colon  . Diabetes Brother   . Leukemia Sister    Allergies  Allergen Reactions  . Sulfonamide Derivatives Nausea And Vomiting    REACTION: sick  . Alendronate Sodium Other (See Comments)    REACTION: GI   Current Outpatient Prescriptions on File Prior to Visit  Medication Sig Dispense Refill  . acetaminophen (TYLENOL) 325 MG tablet Take 650 mg by mouth every 6 (six) hours as needed.    . calcium carbonate (OS-CAL) 600 MG TABS tablet Take 1,200 mg by mouth at bedtime.    . ferrous sulfate 325 (65 FE) MG tablet Take 325 mg by mouth daily. With lunch    . fluticasone (FLONASE) 50 MCG/ACT nasal spray Place 2 sprays into both nostrils daily. 16 g 11  . furosemide (LASIX) 20 MG tablet Take 1 tablet (20 mg total) by mouth daily as needed. 90 tablet 3  . furosemide (LASIX) 40 MG tablet Take 1 tablet daily as needed for increased weight, edema and SOB 30 tablet 0  . Glucosamine HCl 500  MG TABS Take 1 tablet by mouth daily.    [name] levothyroxine (SYNTHROID, LEVOTHROID) 88 MCG tablet TAKE 1 TABLET EVERY DAY 90 tablet 1  . lisinopril (PRINIVIL,ZESTRIL) 2.5 MG tablet Take 1 tablet (2.5 mg total) by mouth at bedtime. 90 tablet 3  . Lutein 6 MG CAPS Take 1 capsule by mouth daily.     [name]  metoprolol tartrate (LOPRESSOR) 25 MG tablet Take 25 mg by mouth 2 (two) times daily.    . midodrine (PROAMATINE) 5 MG tablet Take 1 tablet (5 mg total) by mouth 2 (two) times daily with a meal. 180 tablet 1  . mirtazapine (REMERON) 7.5 MG tablet Take 1 tablet (7.5 mg total) by mouth at bedtime. 90 tablet 3  . Multiple Vitamin (MULTIVITAMIN) tablet Take 1 tablet by mouth daily.     . ondansetron (ZOFRAN-ODT) 4 MG disintegrating tablet Take 4 mg by mouth once.    . pantoprazole (PROTONIX) 40 MG tablet Take 1 tablet (40 mg total) by mouth daily. 90 tablet 3  . potassium chloride (K-DUR,KLOR-CON) 10 MEQ tablet TAKE 1 TABLET EVERY DAY (Patient taking differently: TAKE 1 TABLET EVERY DAY with lunch) 90 tablet 0  . senna-docusate (SENOKOT-S) 8.6-50 MG per tablet Take 1 tablet by mouth at bedtime.    . simvastatin (ZOCOR) 5 MG tablet Take 1 tablet (5 mg total) by mouth every evening.    . warfarin (COUMADIN) 7.5 MG tablet Take 7.5 mg by mouth daily.     No current facility-administered medications on file prior to visit.     Review of Systems     Objective:   Physical Exam        Assessment & Plan:

## 2015-06-09 NOTE — Progress Notes (Signed)
Pre visit review using our clinic review tool, if applicable. No additional management support is needed unless otherwise documented below in the visit note. 

## 2015-06-10 ENCOUNTER — Telehealth: Payer: Self-pay | Admitting: *Deleted

## 2015-06-10 NOTE — Telephone Encounter (Signed)
-----   Message from Abner Greenspan, MD sent at 06/09/2015  5:41 PM EDT ----- B12 is low  Please schedule 4 B12 shots in 4 weeks and have her take 500 mcg B12 orally daily  Re check level in about 5 weeks  Then we will decide on further shots  Platelets are 137- ok  K is a little high so take K every other day instead of daily and change on her med list please

## 2015-06-10 NOTE — Telephone Encounter (Signed)
Pt notified of lab results and Dr. Marliss Coots instructions. b12 inj scheduled and med list updated with pt taking K tab qod

## 2015-06-11 ENCOUNTER — Ambulatory Visit (INDEPENDENT_AMBULATORY_CARE_PROVIDER_SITE_OTHER): Payer: Commercial Managed Care - HMO | Admitting: *Deleted

## 2015-06-11 DIAGNOSIS — E538 Deficiency of other specified B group vitamins: Secondary | ICD-10-CM | POA: Diagnosis not present

## 2015-06-11 MED ORDER — CYANOCOBALAMIN 1000 MCG/ML IJ SOLN
1000.0000 ug | Freq: Once | INTRAMUSCULAR | Status: AC
  Administered 2015-06-11: 1000 ug via INTRAMUSCULAR

## 2015-06-13 ENCOUNTER — Other Ambulatory Visit: Payer: Self-pay | Admitting: *Deleted

## 2015-06-13 MED ORDER — VITAMIN B-12 1000 MCG PO TABS: 1000.0000 ug | ORAL_TABLET | Freq: Every day | ORAL | Status: DC

## 2015-06-17 ENCOUNTER — Other Ambulatory Visit: Payer: Self-pay | Admitting: Otolaryngology

## 2015-06-17 DIAGNOSIS — R1310 Dysphagia, unspecified: Secondary | ICD-10-CM

## 2015-06-18 ENCOUNTER — Other Ambulatory Visit: Payer: Self-pay | Admitting: Otolaryngology

## 2015-06-18 ENCOUNTER — Ambulatory Visit
Admission: RE | Admit: 2015-06-18 | Discharge: 2015-06-18 | Disposition: A | Discharge status: Home / Self Care | Payer: Commercial Managed Care - HMO | Source: Ambulatory Visit | Attending: Otolaryngology | Admitting: Otolaryngology

## 2015-06-18 DIAGNOSIS — R1310 Dysphagia, unspecified: Secondary | ICD-10-CM | POA: Insufficient documentation

## 2015-06-18 DIAGNOSIS — Z87891 Personal history of nicotine dependence: Secondary | ICD-10-CM | POA: Insufficient documentation

## 2015-06-18 DIAGNOSIS — I517 Cardiomegaly: Secondary | ICD-10-CM | POA: Insufficient documentation

## 2015-06-18 DIAGNOSIS — M8580 Other specified disorders of bone density and structure, unspecified site: Secondary | ICD-10-CM | POA: Insufficient documentation

## 2015-06-18 DIAGNOSIS — R1312 Dysphagia, oropharyngeal phase: Secondary | ICD-10-CM

## 2015-06-18 DIAGNOSIS — J439 Emphysema, unspecified: Secondary | ICD-10-CM | POA: Diagnosis not present

## 2015-06-18 NOTE — Therapy (Signed)
[place], [place], [address] Phone: [phone]   Fax:     Modified Barium Swallow  Patient Details  Name: [name] MRN: [id number] Date of Birth: 12/29/1921 Referring Provider:  Carloyn Manner, MD  Encounter Date: 06/18/2015      End of Session - 06/18/15 1131    Visit Number 1   Number of Visits 1   Date for SLP Re-Evaluation 06/18/15   SLP Start Time 1000   SLP Stop Time  1051   SLP Time Calculation (min) 51 min   Activity Tolerance Patient tolerated treatment well      Past Medical History  Diagnosis Date  . Chronic atrial fibrillation     a. on Coumadin; b. Holter 06/2010, no brady, mild tachy  . CAD (coronary artery disease)     a. 2 DES, [facility], 2004; b. nuc 2013 septal dyssenergy 2/2 LBBB, nl LV fxn  . Pleural effusion associated with pulmonary infection 03-2007  . Hypothyroid   . Hyperlipidemia   . GERD (gastroesophageal reflux disease)   . Diverticulosis of colon   . Allergy   . Anemia   . Arthritis     osteo  . Hypertension   . COPD (chronic obstructive pulmonary disease)   . Mitral regurgitation   . Vitamin B deficiency   . Compression fracture of lumbar vertebra 05-2008  . Osteoporosis   . Thrombocytopenia   . SOB (shortness of breath)   . OA (osteoarthritis)   . LBBB (left bundle branch block)   . Hx of colonoscopy   . Warfarin anticoagulation   . TR (tricuspid regurgitation)     moderate, echo 2008  . Dyslipidemia   . Lung abnormality     Dr [name] 122/2011 no further w/u needed  . Prolonged QT interval     when on sotolol in past  . Volume overload     intermittant  . Hypertension   . Weight loss     August, 2012  . Nausea     Some nausea with medications, May, 2013,  . Orthostatic hypotension     June, 2014  . Shingles     Past Surgical History  Procedure Laterality Date  . Thyroidectomy  1962  . Tonsillectomy      There were no  vitals filed for this visit.  Visit Diagnosis: Oropharyngeal dysphagia  Dysphagia - Plan: DG OP Swallowing Func-Medicare/Speech Path, DG OP Swallowing Func-Medicare/Speech Path   Subjective: Patient behavior: Patient is alert and able to follow directions.  Chief complaint: Coughing with thin liquid, refusing thickened liquids.  History of weight loss.  Multiple co-morbidities.  Evaluated by Dr. Pryor Ochoa with report of bowed vocal cords.  Chest X-ray this morning showing clear lungs with emphysema, cardiomegaly, and osteopenia.    Objective:  Radiological Procedure: A videoflouroscopic evaluation of oral-preparatory, reflex initiation, and pharyngeal phases of the swallow was performed; as well as a screening of the upper esophageal phase.  I. POSTURE: Upright in MBS chair  II. VIEW: Lateral   III. COMPENSATORY STRATEGIES: Dry swallows aids pharyngeal clearance  IV. BOLUSES ADMINISTERED:   Thin Liquid: 2 small sips (required 2-3 piecemeal swallows); multi-sips (5 rapid swallows)   Nectar-thick Liquid: 1 sip (required 2 piecemeal swallows)    Puree: 2 teaspoon   Mechanical Soft: 1/4 graham cracker in applesauce  V. RESULTS OF EVALUATION: A. ORAL PREPARATORY PHASE: (The lips, tongue, and velum are observed for strength and coordination) disorganized  posterior transfer with piecemeal swallowing       **Overall Severity Rating: Mild  B. SWALLOW INITIATION/REFLEX: (The reflex is normal if "triggered" by the time the bolus reached the base of the tongue) Triggers while falling from the valleculae to the pyriform sinuses  **Overall Severity Rating: Moderate  C. PHARYNGEAL PHASE: (Pharyngeal function is normal if the bolus shows rapid, smooth, and continuous transit through the pharynx and there is no pharyngeal residue after the swallow) Decreased tongue base retraction and hyolaryngeal movement (anterior movement, elevation is WNL).  **Overall Severity Rating: Mild  D. LARYNGEAL  PENETRATION: (Material entering into the laryngeal inlet/vestibule but not aspirated) There were 2 episodes of laryngeal penetration (trace) during multiple swallows of thin liquid with associated throat clearing.   E. ASPIRATION: None  F. ESOPHAGEAL PHASE: (Screening of the upper esophagus): Trace retention of barium in cervical esophagus without retrograde movement through the UES.  ASSESSMENT: This 81 year old woman; with multiple co-morbidities, observed coughing with thin liquids, and bowed vocal cords; is presenting with mild oropharyngeal dysphagia characterized by impaired oral control for efficient/effective posterior bolus transfer with oral residue and piecemeal swallowing, delayed pharyngeal swallow initiation (triggers while falling from the valleculae to the pyriform sinuses), and minimally decreased pharyngeal pressure generation.   There is mild pharyngeal residue (valleculae more than pyriform sinuses) that clears with subsequent swallows.  There were 2 episodes of laryngeal penetration (trace) during multiple swallows of thin liquid and no observed tracheal aspiration.  The patient demonstrated throat clearing in response to observed laryngeal penetration but frank coughing was not elicited.  The patient appears to be at reduced risk for prandial aspiration at this time.  The patient had a chest X-ray this morning that demonstrated clear lungs with emphysema, cardiomegaly, and osteopenia.  Screening of the cervical esophagus showed consistent trace retention of barium in the upper esophagus without retrograde movement through the UES.  Recommend continuing the patient's usual diet.  She will benefit from ongoing speech therapy to develop swallowing guidelines to minimize prandial coughing and maximize nutritional intake.  In addition, the patient may benefit from voice therapy, including high effort/high intensity vocal exercises, to improve vocal quality.  PLAN/RECOMMENDATIONS:   A.  Diet: Regular as tolerated   B. Swallowing Precautions: Per treating SLP   C. Recommended consultation to N/A   D. Therapy recommendations: develop swallowing guidelines to minimize prandial coughing and                            maximize nutritional intake.  In addition, the patient may benefit from voice therapy, including                       high effort/high intensity vocal exercises, to improve vocal quality.   E. Results and recommendations were discussed with the patient and her daughter immediately                            following the study.  The final report will be routed to the referring MD and PCP and faxed to                        treating SLP.          G-Codes - 07/07/2015 1132    Functional Assessment Tool Used MBS   Functional Limitations Swallowing   Swallow Current  Status [phone]) At least 20 percent but less than 40 percent impaired, limited or restricted   Swallow Goal Status [phone]) At least 20 percent but less than 40 percent impaired, limited or restricted   Swallow Discharge Status [phone]) At least 20 percent but less than 40 percent impaired, limited or restricted          Problem List Patient Active Problem List   Diagnosis Date Noted  . Fatigue 06/09/2015  . Adjustment disorder with mixed anxiety and depressed mood 06/09/2015  . Generalized weakness 05/12/2015  . Chronic systolic CHF (congestive heart failure) ([place]) 04/29/2015  . Pain in the chest   . Cardiomyopathy, ischemic   . Acute systolic CHF (congestive heart failure) ([place])   . Pulmonary hypertension ([place])   . Adult failure to thrive   . Chronic atrial fibrillation ([place])   . Unstable angina ([place]) 04/07/2015  . Right sided sciatica 10/25/2014  . Venous stasis dermatitis 06/19/2014  . Varicose veins of lower extremities with inflammation 03/25/2014  . Cellulitis 03/08/2014  . Chronic diastolic CHF (congestive heart failure) ([place]) 01/04/2014  . Knee pain 08/27/2013  . Cough  07/03/2013  . Acute bronchitis 07/03/2013  . Orthostatic hypotension   . Acute on chronic combined systolic and diastolic heart failure ([place]) 03/02/2013  . Acute renal failure ([place]) 02/28/2013  . Dehydration 02/28/2013  . Shingles 02/28/2013  . Protein-calorie malnutrition, severe ([place]) 02/28/2013  . Nail fungus 01/10/2013  . Productive cough 02/29/2012  . Nausea   . Left shoulder pain 10/06/2011  . Lumbar pain 10/06/2011  . Hyperlipidemia 07/09/2011  . Vision blurred 07/09/2011  . Hearing loss 07/09/2011  . Weight loss   . A-fib ([place])   . Tachycardia   . CAD (coronary artery disease)   . Mitral regurgitation   . SOB (shortness of breath)   . LBBB (left bundle branch block)   . Warfarin anticoagulation   . Ejection fraction   . TR (tricuspid regurgitation)   . Dyslipidemia   . Lung abnormality   . Prolonged QT interval   . Volume overload   . Coccydynia 12/03/2010  . Nonspecific (abnormal) findings on radiological and other examination of body structure 07/13/2010  . ABNORMAL CHEST XRAY 07/13/2010  . Shortness of breath 06/18/2010  . Essential hypertension 06/24/2009  . GOUT, UNSPECIFIED 03/20/2009  . GERD 03/18/2009  . THROMBOCYTOPENIA 01/03/2009  . ALLERGIC RHINITIS 12/05/2008  . HOARSENESS 06/07/2008  . MEDIASTINAL LYMPHADENOPATHY 05/13/2008  . Other diseases of lung, not elsewhere classified 04/18/2008  . Venous (peripheral) insufficiency 09/13/2007  . FREQUENCY, URINARY 07/19/2007  . VERTIGO, BENIGN PAROXYSMAL POSITION 05/11/2007  . TINNITUS NOS 05/11/2007  . OSTEOPOROSIS NOS 05/11/2007  . HYPOTHYROIDISM 04/11/2007  . ANEMIA, B12 DEFICIENCY 04/11/2007  . HYPOXEMIA 04/11/2007   Leroy Sea, MS/CCC- SLP  Lou Miner 06/18/2015, 11:32 AM  [facility] [place], [place], [address] Phone: [phone]   Fax:

## 2015-06-19 ENCOUNTER — Telehealth: Payer: Self-pay

## 2015-06-19 ENCOUNTER — Ambulatory Visit (INDEPENDENT_AMBULATORY_CARE_PROVIDER_SITE_OTHER): Payer: Commercial Managed Care - HMO | Admitting: *Deleted

## 2015-06-19 ENCOUNTER — Ambulatory Visit: Payer: Commercial Managed Care - HMO

## 2015-06-19 DIAGNOSIS — E538 Deficiency of other specified B group vitamins: Secondary | ICD-10-CM | POA: Diagnosis not present

## 2015-06-19 MED ORDER — CYANOCOBALAMIN 1000 MCG/ML IJ SOLN
1000.0000 ug | Freq: Once | INTRAMUSCULAR | Status: AC
  Administered 2015-06-19: 1000 ug via INTRAMUSCULAR

## 2015-06-19 NOTE — Telephone Encounter (Signed)
Amy speech therapist with [facility] left v/m requesting requesting verbal order to reschedule [facility] speech therapy next week; Amy cannot go out this week.

## 2015-06-19 NOTE — Telephone Encounter (Signed)
Verbal order given  

## 2015-06-19 NOTE — Telephone Encounter (Signed)
Please ok that verbal order  

## 2015-06-24 ENCOUNTER — Encounter: Payer: Self-pay | Admitting: Nurse Practitioner

## 2015-06-24 ENCOUNTER — Telehealth: Payer: Self-pay

## 2015-06-24 ENCOUNTER — Ambulatory Visit (INDEPENDENT_AMBULATORY_CARE_PROVIDER_SITE_OTHER): Payer: Commercial Managed Care - HMO | Admitting: Nurse Practitioner

## 2015-06-24 VITALS — BP 120/62 | HR 81 | Ht 67.0 in | Wt 125.0 lb

## 2015-06-24 DIAGNOSIS — I200 Unstable angina: Secondary | ICD-10-CM | POA: Diagnosis not present

## 2015-06-24 DIAGNOSIS — R0602 Shortness of breath: Secondary | ICD-10-CM | POA: Diagnosis not present

## 2015-06-24 DIAGNOSIS — I255 Ischemic cardiomyopathy: Secondary | ICD-10-CM

## 2015-06-24 DIAGNOSIS — I10 Essential (primary) hypertension: Secondary | ICD-10-CM

## 2015-06-24 DIAGNOSIS — I25110 Atherosclerotic heart disease of native coronary artery with unstable angina pectoris: Secondary | ICD-10-CM | POA: Diagnosis not present

## 2015-06-24 NOTE — Telephone Encounter (Signed)
Amy speech therapist with [facility] left v/m requesting verbal orders to continue home health speech therapy for 2 more visits; one visit this week and one visit next week.

## 2015-06-24 NOTE — Patient Instructions (Addendum)
Medication Instructions:  Your physician recommends that you continue on your current medications as directed. Please refer to the Current Medication list given to you today.   Labwork: BNP  Testing/Procedures: Your physician has requested that you have a lexiscan myoview. For further information please visit [email or web address]. Please follow instruction sheet, as given.  [place]  Your caregiver has ordered a Stress Test with nuclear imaging. The purpose of this test is to evaluate the blood supply to your heart muscle. This procedure is referred to as a "Non-Invasive Stress Test." This is because other than having an IV started in your vein, nothing is inserted or "invades" your body. Cardiac stress tests are done to find areas of poor blood flow to the heart by determining the extent of coronary artery disease (CAD). Some patients exercise on a treadmill, which naturally increases the blood flow to your heart, while others who are  unable to walk on a treadmill due to physical limitations have a pharmacologic/chemical stress agent called Lexiscan . This medicine will mimic walking on a treadmill by temporarily increasing your coronary blood flow.   Please note: these test may take anywhere between 2-4 hours to complete  PLEASE REPORT TO [place] TO GO  Date of Procedure: Wed, Oct 19, 8:00am  Arrival Time for Procedure: 7:45am  Instructions regarding medication:     __xx__:  Hold metoprolol night before procedure and morning of procedure  __xx__:  Hold other medications as follows: lasix and potassium the morning of your test. You may take them after your test PLEASE NOTIFY THE OFFICE AT LEAST 24 HOURS IN ADVANCE IF YOU ARE UNABLE TO [place].  [phone] AND  PLEASE NOTIFY NUCLEAR MEDICINE AT [facility] AT LEAST 24 HOURS IN ADVANCE IF YOU ARE UNABLE TO KEEP YOUR APPOINTMENT. [phone]  How  to prepare for your Myoview test:   Do not eat or drink after midnight  No caffeine for 24 hours prior to test  No smoking 24 hours prior to test.  Your medication may be taken with water.  If your doctor stopped a medication because of this test, do not take that medication.  Ladies, please do not wear dresses.  Skirts or pants are appropriate. Please wear a short sleeve shirt.  No perfume, cologne or lotion.  Wear comfortable walking shoes. No heels!            Follow-Up: Your physician recommends that you schedule a follow-up appointment in: Oct 25 w/Chris Sharolyn Douglas, NP   Any Other Special Instructions Will Be Listed Below (If Applicable).  Cardiac Nuclear Scanning A cardiac nuclear scan is used to check your heart for problems, such as the following:  A portion of the heart is not getting enough blood.  Part of the heart muscle has died, which happens with a heart attack.  The heart wall is not working normally.  In this test, a radioactive dye (tracer) is injected into your bloodstream. After the tracer has traveled to your heart, a scanning device is used to measure how much of the tracer is absorbed by or distributed to various areas of your heart. LET [facility] CARE PROVIDER KNOW ABOUT:  Any allergies you have.  All medicines you are taking, including vitamins, herbs, eye drops, creams, and over-the-counter medicines.  Previous problems you or members of your family have had with the use of anesthetics.  Any blood disorders you have.  Previous surgeries you have had.  Medical conditions you have.  RISKS AND COMPLICATIONS Generally, this is a safe procedure. However, as with any procedure, problems can occur. Possible problems include:   Serious chest pain.  Rapid heartbeat.  Sensation of warmth in your chest. This usually passes quickly. BEFORE THE PROCEDURE Ask your health care provider about changing or stopping your regular  medicines. PROCEDURE This procedure is usually done at a hospital and takes 2-4 hours.  An IV tube is inserted into one of your veins.  Your health care provider will inject a small amount of radioactive tracer through the tube.  You will then wait for 20-40 minutes while the tracer travels through your bloodstream.  You will lie down on an exam table so images of your heart can be taken. Images will be taken for about 15-20 minutes.  You will exercise on a treadmill or stationary bike. While you exercise, your heart activity will be monitored with an electrocardiogram (ECG), and your blood pressure will be checked.  If you are unable to exercise, you may be given a medicine to make your heart beat faster.  When blood flow to your heart has peaked, tracer will again be injected through the IV tube.  After 20-40 minutes, you will get back on the exam table and have more images taken of your heart.  When the procedure is over, your IV tube will be removed. AFTER THE PROCEDURE  You will likely be able to leave shortly after the test. Unless your health care provider tells you otherwise, you may return to your normal schedule, including diet, activities, and medicines.  Make sure you find out how and when you will get your test results.   This information is not intended to replace advice given to you by your health care provider. Make sure you discuss any questions you have with your health care provider.   Document Released: 09/24/2004 Document Revised: 09/04/2013 Document Reviewed: 08/08/2013 Elsevier Interactive Patient Education [employer].

## 2015-06-24 NOTE — Progress Notes (Signed)
Patient Name: [name] Date of Encounter: 06/24/2015  Primary Care Provider:  Loura Pardon, MD Primary Cardiologist:  Johnny Bridge, MD (formerly Veatrice Bourbon, MD)  Chief Complaint  81 y/o female with a h/o CAD and persistent AF, who presents today due to progressive fatigue, dyspnea, and intermittent c/p.  Past Medical History   Past Medical History  Diagnosis Date  . Chronic atrial fibrillation (HCC)     a. on Coumadin; b. Holter 06/2010, no brady, mild tachy  . CAD (coronary artery disease)     a. 2 DES, [facility], 2004; b. nuc 2013 septal dyssenergy 2/2 LBBB, nl LV fxn  . Pleural effusion associated with pulmonary infection 03-2007  . Hypothyroid   . Hyperlipidemia   . GERD (gastroesophageal reflux disease)   . Diverticulosis of colon   . Allergy   . Anemia   . Arthritis     osteo  . Hypertension   . COPD (chronic obstructive pulmonary disease) ([place])   . Mitral regurgitation   . Vitamin B deficiency   . Compression fracture of lumbar vertebra ([place]) 05-2008  . Osteoporosis   . Thrombocytopenia ([place])   . OA (osteoarthritis)   . LBBB (left bundle branch block)   . Hx of colonoscopy   . Warfarin anticoagulation   . TR (tricuspid regurgitation)     moderate, echo 2008  . Dyslipidemia   . Lung abnormality     Dr [name] 122/2011 no further w/u needed  . Prolonged QT interval     when on sotolol in past  . Essential hypertension   . Weight loss     August, 2012  . Nausea     Some nausea with medications, May, 2013,  . Orthostatic hypotension     June, 2014  . Shingles   . Ischemic cardiomyopathy     a. 03/2015 Echo: EF 30-35%, ant, antsept HK, mod MR, mildly dil LA/RV, mod dil RA, mod-sev TR, PASP 41mmHg.  [name] Hematemesis     a. 03/2015.  . Pulmonary hypertension ([place])     a. 03/2015 PASP 75mmHg by echo.   Past Surgical History  Procedure Laterality Date  . Thyroidectomy  1962  . Tonsillectomy      Allergies  Allergies  Allergen Reactions  . Sulfonamide  Derivatives Nausea And Vomiting    REACTION: sick  . Alendronate Sodium Other (See Comments)    REACTION: GI    HPI  81 y/o female with a h /o CAD s/p stenting @ [facility] in 2004, AF on coumadin, HTN, HL, and PAH.  She was hospitalized @ [facility] in July with n, v and mild volume overload.  She was diuresed.  Trops were neg.  An Echo showed an EF of 30-35% with wma's as outlined above.  She was seen by cardiology and given frailty, it was felt most appropriate to manage her conservatively and reassess as an outpatient to consider stress testing.  She initially did reasonably well following d/c, but more recently, she has noted progressive fatigue, shakiness, worsening DOE, and intermittent exertional sscp - lasting a few mins and resolving spontaneously.  She has also been coughing at night with thick mucous in the absence of dyspnea/orthopnea. She had a barium swallow, which was nl, however her dtr says that she has witnessed her choking on thin liquids.  She already sleeps on 2 pillow @ night.  She denies chest pain, palpitations, pnd, n, v, dizziness, syncope, edema, or wt gain.  Her appetite has been  very poor and she feels like she has had to force herself to eat.  Home Medications  Prior to Admission medications   Medication Sig Start Date End Date Taking? Authorizing Provider  acetaminophen (TYLENOL) 325 MG tablet Take 650 mg by mouth every 6 (six) hours as needed.   Yes Historical Provider, MD  calcium carbonate (OS-CAL) 600 MG TABS tablet Take 1,200 mg by mouth at bedtime.   Yes Historical Provider, MD  ferrous sulfate 325 (65 FE) MG tablet Take 325 mg by mouth daily. With lunch   Yes Historical Provider, MD  fluticasone (FLONASE) 50 MCG/ACT nasal spray Place 2 sprays into both nostrils daily. Patient taking differently: Place 2 sprays into both nostrils as needed.  05/27/15  Yes Abner Greenspan, MD  furosemide (LASIX) 20 MG tablet Take 1 tablet (20 mg total) by mouth daily as needed. 05/27/15  Yes  Thayer Headings, MD  Glucosamine HCl 500 MG TABS Take 1 tablet by mouth daily.   Yes Historical Provider, MD  levothyroxine (SYNTHROID, LEVOTHROID) 88 MCG tablet TAKE 1 TABLET EVERY DAY 05/23/15  Yes Abner Greenspan, MD  lisinopril (PRINIVIL,ZESTRIL) 2.5 MG tablet Take 1 tablet (2.5 mg total) by mouth at bedtime. 05/14/15  Yes Abner Greenspan, MD  Lutein 6 MG CAPS Take 1 capsule by mouth daily.    Yes Historical Provider, MD  metoprolol tartrate (LOPRESSOR) 25 MG tablet Take 25 mg by mouth 2 (two) times daily.   Yes Historical Provider, MD  midodrine (PROAMATINE) 5 MG tablet Take 1 tablet (5 mg total) by mouth 2 (two) times daily with a meal. 05/21/15  Yes Carlena Bjornstad, MD  mirtazapine (REMERON) 15 MG tablet Take 1 tablet (15 mg total) by mouth at bedtime. 06/09/15  Yes Abner Greenspan, MD  Multiple Vitamin (MULTIVITAMIN) tablet Take 1 tablet by mouth daily.    Yes Historical Provider, MD  pantoprazole (PROTONIX) 40 MG tablet Take 1 tablet (40 mg total) by mouth daily. 09/23/14  Yes Abner Greenspan, MD  potassium chloride (K-DUR,KLOR-CON) 10 MEQ tablet Take 10 mEq by mouth every other day.   Yes Historical Provider, MD  senna-docusate (SENOKOT-S) 8.6-50 MG per tablet Take 1 tablet by mouth at bedtime.   Yes Historical Provider, MD  simvastatin (ZOCOR) 5 MG tablet Take 1 tablet (5 mg total) by mouth every evening. 03/13/15  Yes Tonia Ghent, MD  vitamin B-12 (CYANOCOBALAMIN) 1000 MCG tablet Take 1 tablet (1,000 mcg total) by mouth daily. 06/13/15  Yes Abner Greenspan, MD  warfarin (COUMADIN) 7.5 MG tablet Take 7.5 mg by mouth daily. Except Mon. And Friday.   Yes Historical Provider, MD    Review of Systems  Fatigue, shakiness, doe, exertional chest pressure as outlined above.  She has also been coughing with thick, white secretions - exclusively when lying in bed @ night.  All other systems reviewed and are otherwise negative except as noted above.  Physical Exam  VS:  BP 120/62 mmHg  Pulse 81  Ht 5\' 7"   (1.702 m)  Wt 125 lb (56.7 kg)  BMI 19.57 kg/m2 , BMI Body mass index is 19.57 kg/(m^2). GEN: Well nourished, well developed, in no acute distress. HEENT: normal. Neck: Supple, no JVD, carotid bruits, or masses. Cardiac: IR, IR, 2/6 syst murmur along left sternal border, no rubs, or gallops. No clubbing, cyanosis, edema.  Radials/DP/PT 2+ and equal bilaterally.  Respiratory:  Respirations regular and unlabored, bibasilar crackles. GI: Soft, nontender, nondistended, BS + x  4. MS: no deformity or atrophy. Skin: warm and dry, no rash. Neuro:  Strength and sensation are intact. Psych: Normal affect.  Accessory Clinical Findings  ECG - AF, 81, LAD, LBBB - no acute st/t changes.  Assessment & Plan  1.  Unstable Angina/CAD:  Pt reports progressive DOE and intermittent exertional angina over the past few wks.  She was dx with ICM in July and at that time was not felt to be a good candidate for an ischemic evaluation.  She is concerned about her Ss and she and her dtr are now interested in pursuing additional evaluation.  I will arrange for an outpt lexiscan myoview to assess ischemia. If high risk, we would have to strongly consider cath.  Cont bb and statin.  No asa as she is on warfarin.  2.  ICM/Chronic systolic CHF:  Appears to be euvolemic on exam and wt has been stable @ home.  No evidence for low output on exam.  She has had worsening fatigue and DOE as well as nocturnal coughing w/o dyspnea.  I will check a pBNP today.  Cont bb/acei.  She takes lasix 20 as needed.  Recent creat was nl.  If pBNP elevated, she may need to take lasix 20 daily, though I suspect, given her poor PO intake, that she would be very prone to dehydration.  Though she had a nl barium swallow, her dtr imparts that she does choke on thin liquids @ home.  I would still question what role possible aspiration at night may be playing in her nocturnal coughing.  3.  Essential HTN:  Stable.  4.  Dispo:  lexiscan MV as outlined  above.  F/U in the next 7-10 days to re-eval.    Murray Hodgkins, NP 06/24/2015, 4:40 PM

## 2015-06-24 NOTE — Telephone Encounter (Signed)
Please verbally ok that order  

## 2015-06-25 LAB — BRAIN NATRIURETIC PEPTIDE: BNP: 227.7 pg/mL — AB (ref 0.0–100.0)

## 2015-06-25 NOTE — Telephone Encounter (Signed)
Verbal order given to Amy 

## 2015-06-26 ENCOUNTER — Encounter: Payer: Self-pay | Admitting: Nurse Practitioner

## 2015-06-26 ENCOUNTER — Ambulatory Visit (INDEPENDENT_AMBULATORY_CARE_PROVIDER_SITE_OTHER): Payer: Commercial Managed Care - HMO | Admitting: *Deleted

## 2015-06-26 ENCOUNTER — Telehealth: Payer: Self-pay | Admitting: *Deleted

## 2015-06-26 ENCOUNTER — Ambulatory Visit: Payer: Commercial Managed Care - HMO

## 2015-06-26 DIAGNOSIS — E538 Deficiency of other specified B group vitamins: Secondary | ICD-10-CM | POA: Diagnosis not present

## 2015-06-26 MED ORDER — CYANOCOBALAMIN 1000 MCG/ML IJ SOLN
1000.0000 ug | Freq: Once | INTRAMUSCULAR | Status: AC
  Administered 2015-06-26: 1000 ug via INTRAMUSCULAR

## 2015-06-26 NOTE — Telephone Encounter (Signed)
Pt daughter calling stating she had some more questions on the upcoming apt stress test.  She said if you could just call her.  Please call pt

## 2015-06-26 NOTE — Telephone Encounter (Signed)
S/w pt daughter [name] who is concerned of upcoming lexi scan that was ordered by Ignacia Bayley, NP. States she is worried because pt has afib. Assured her afib is noted in pt chart and Gerald Stabs is aware. Daughter indicates that if lexi is abnormal, pt states she would not want anything invasive done. Informed her that doctors would advise and final decision is up to the patient.  Daughter verbalized understanding and is appreciative of the call.

## 2015-07-01 ENCOUNTER — Telehealth: Payer: Self-pay

## 2015-07-01 NOTE — Telephone Encounter (Signed)
Reviewed 10/19 myoview information w/pt daughter [name] Daughter verbalized understanding of locations, arrival time, meds to hold, NPO

## 2015-07-02 ENCOUNTER — Encounter
Admission: RE | Admit: 2015-07-02 | Discharge: 2015-07-02 | Disposition: A | Discharge status: Home / Self Care | Payer: Commercial Managed Care - HMO | Source: Ambulatory Visit | Attending: Nurse Practitioner | Admitting: Nurse Practitioner

## 2015-07-02 DIAGNOSIS — I255 Ischemic cardiomyopathy: Secondary | ICD-10-CM

## 2015-07-02 LAB — NM MYOCAR MULTI W/SPECT W/WALL MOTION / EF
CHL CUP NUCLEAR SDS: 0
CHL CUP NUCLEAR SRS: 2
CHL CUP RESTING HR STRESS: 80 {beats}/min
CSEPHR: 83 %
LV sys vol: 47 mL
LVDIAVOL: 101 mL
Peak HR: 108 {beats}/min
SSS: 1
TID: 1.16

## 2015-07-02 MED ORDER — TECHNETIUM TC 99M SESTAMIBI - CARDIOLITE
32.7300 | Freq: Once | INTRAVENOUS | Status: AC | PRN
  Administered 2015-07-02: 09:00:00 32.73 via INTRAVENOUS

## 2015-07-02 MED ORDER — REGADENOSON 0.4 MG/5ML IV SOLN
0.4000 mg | Freq: Once | INTRAVENOUS | Status: AC
  Administered 2015-07-02: 0.4 mg via INTRAVENOUS

## 2015-07-02 MED ORDER — TECHNETIUM TC 99M SESTAMIBI - CARDIOLITE
13.0000 | Freq: Once | INTRAVENOUS | Status: AC | PRN
  Administered 2015-07-02: 13.48 via INTRAVENOUS

## 2015-07-03 ENCOUNTER — Ambulatory Visit: Payer: Commercial Managed Care - HMO

## 2015-07-03 ENCOUNTER — Ambulatory Visit (INDEPENDENT_AMBULATORY_CARE_PROVIDER_SITE_OTHER): Payer: Commercial Managed Care - HMO | Admitting: *Deleted

## 2015-07-03 DIAGNOSIS — E538 Deficiency of other specified B group vitamins: Secondary | ICD-10-CM | POA: Diagnosis not present

## 2015-07-03 MED ORDER — CYANOCOBALAMIN 1000 MCG/ML IJ SOLN
1000.0000 ug | Freq: Once | INTRAMUSCULAR | Status: AC
Start: 2015-07-03 — End: 2015-07-03
  Administered 2015-07-03: 1000 ug via INTRAMUSCULAR

## 2015-07-07 ENCOUNTER — Ambulatory Visit: Payer: Commercial Managed Care - HMO

## 2015-07-08 ENCOUNTER — Ambulatory Visit (INDEPENDENT_AMBULATORY_CARE_PROVIDER_SITE_OTHER): Payer: Commercial Managed Care - HMO | Admitting: Nurse Practitioner

## 2015-07-08 ENCOUNTER — Encounter: Payer: Self-pay | Admitting: Nurse Practitioner

## 2015-07-08 VITALS — BP 120/62 | HR 80 | Ht 67.5 in | Wt 125.0 lb

## 2015-07-08 DIAGNOSIS — I4819 Other persistent atrial fibrillation: Secondary | ICD-10-CM

## 2015-07-08 DIAGNOSIS — I481 Persistent atrial fibrillation: Secondary | ICD-10-CM | POA: Diagnosis not present

## 2015-07-08 DIAGNOSIS — I5022 Chronic systolic (congestive) heart failure: Secondary | ICD-10-CM | POA: Diagnosis not present

## 2015-07-08 DIAGNOSIS — I2510 Atherosclerotic heart disease of native coronary artery without angina pectoris: Secondary | ICD-10-CM

## 2015-07-08 DIAGNOSIS — I255 Ischemic cardiomyopathy: Secondary | ICD-10-CM

## 2015-07-08 DIAGNOSIS — I482 Chronic atrial fibrillation: Secondary | ICD-10-CM

## 2015-07-08 NOTE — Patient Instructions (Signed)
Medication Instructions:  Your physician recommends that you continue on your current medications as directed. Please refer to the Current Medication list given to you today.   Labwork: none  Testing/Procedures: none  Follow-Up: Keep your follow up appointment with Dr. Rockey Situ  Any Other Special Instructions Will Be Listed Below (If Applicable).     If you need a refill on your cardiac medications before your next appointment, please call your pharmacy.

## 2015-07-08 NOTE — Progress Notes (Signed)
Patient Name: [name] Date of Encounter: 07/08/2015  Primary Care Provider:  Loura Pardon, MD Primary Cardiologist:  Johnny Bridge, MD (formerly Veatrice Bourbon, MD)  Chief Complaint  81 y/o female with a h/o CAD and persistent AF, who presents today due to ongoing fatigue.  Past Medical History   Past Medical History  Diagnosis Date  . Chronic atrial fibrillation (HCC)     a. on Coumadin (CHA2DS2VASc = 6); b. Holter 06/2010, no brady, mild tachy.  . CAD (coronary artery disease)     a. 2 DES, [facility], 2004; b. nuc 2013 septal dyssenergy 2/2 LBBB, nl LV fxn;  c. 06/2015 MV: EF 41%, septal HK (LBBB), no ischemia->Low risk.  . Pleural effusion associated with pulmonary infection 03-2007  . Hypothyroid   . Hyperlipidemia   . GERD (gastroesophageal reflux disease)   . Diverticulosis of colon   . Allergy   . Anemia   . Arthritis     osteo  . Hypertension   . COPD (chronic obstructive pulmonary disease) ([place])   . Mitral regurgitation   . Vitamin B deficiency   . Compression fracture of lumbar vertebra ([place]) 05-2008  . Osteoporosis   . Thrombocytopenia ([place])   . OA (osteoarthritis)   . LBBB (left bundle branch block)   . Hx of colonoscopy   . Warfarin anticoagulation   . TR (tricuspid regurgitation)     moderate, echo 2008  . Dyslipidemia   . Lung abnormality     Dr [name] 122/2011 no further w/u needed  . Prolonged QT interval     when on sotolol in past  . Essential hypertension   . Weight loss     August, 2012  . Nausea     Some nausea with medications, May, 2013,  . Orthostatic hypotension     June, 2014  . Shingles   . Ischemic cardiomyopathy     a. 03/2015 Echo: EF 30-35%, ant, antsept HK, mod MR, mildly dil LA/RV, mod dil RA, mod-sev TR, PASP 63mmHg.  [name] Hematemesis     a. 03/2015.  . Pulmonary hypertension ([place])     a. 03/2015 PASP 60mmHg by echo.   Past Surgical History  Procedure Laterality Date  . Thyroidectomy  1962  . Tonsillectomy       Allergies  Allergies  Allergen Reactions  . Sulfonamide Derivatives Nausea And Vomiting    REACTION: sick  . Alendronate Sodium Other (See Comments)    REACTION: GI    HPI  81 y/o female with a h /o CAD s/p stenting @ [facility] in 2004, AF on coumadin, HTN, HL, and PAH. She was hospitalized @ [facility] in July with n, v and mild volume overload. She was diuresed. Trops were neg. An Echo showed an EF of 30-35% with wma's as outlined above. She was seen by cardiology and given frailty, it was felt most appropriate to manage her conservatively and reassess as an outpatient to consider stress testing.  I last saw her in clinic on 10/11, @ which time she c/o exertional dyspnea, chest discomfort, and fatigue.  BNP was only in the 200 range that day and her volume was stable on exam.  A lexiscan cardiolite was subsequently performed and did not reveal any active ischemia.  She presents today for f/u.  She is now in assisted living.  Her dtr has arranged for sitters to spend most of the day with her.  She is no longer receiving physical therapy though apparently she was  felt to be a candidate for ongoing therapy.  Her activity is quite limited and with that, she continues to feel fatigued with DOE.  She denies chest pain, palpitations, pnd, orthopnea, n, v, dizziness, syncope, edema, weight gain, or early satiety.   Home Medications  Prior to Admission medications   Medication Sig Start Date End Date Taking? Authorizing Provider  acetaminophen (TYLENOL) 325 MG tablet Take 650 mg by mouth every 6 (six) hours as needed.   Yes Historical Provider, MD  calcium carbonate (OS-CAL) 600 MG TABS tablet Take 1,200 mg by mouth at bedtime.   Yes Historical Provider, MD  ferrous sulfate 325 (65 FE) MG tablet Take 325 mg by mouth daily. With lunch   Yes Historical Provider, MD  fluticasone (FLONASE) 50 MCG/ACT nasal spray Place 2 sprays into both nostrils daily. Patient taking differently: Place 2 sprays into both  nostrils as needed.  05/27/15  Yes Abner Greenspan, MD  furosemide (LASIX) 20 MG tablet Take 1 tablet (20 mg total) by mouth daily as needed. 05/27/15  Yes Thayer Headings, MD  Glucosamine HCl 500 MG TABS Take 1 tablet by mouth daily.   Yes Historical Provider, MD  levothyroxine (SYNTHROID, LEVOTHROID) 88 MCG tablet TAKE 1 TABLET EVERY DAY 05/23/15  Yes Abner Greenspan, MD  lisinopril (PRINIVIL,ZESTRIL) 2.5 MG tablet Take 1 tablet (2.5 mg total) by mouth at bedtime. 05/14/15  Yes Abner Greenspan, MD  Lutein 6 MG CAPS Take 1 capsule by mouth daily.    Yes Historical Provider, MD  metoprolol tartrate (LOPRESSOR) 25 MG tablet Take 25 mg by mouth 2 (two) times daily.   Yes Historical Provider, MD  midodrine (PROAMATINE) 5 MG tablet Take 1 tablet (5 mg total) by mouth 2 (two) times daily with a meal. 05/21/15  Yes Carlena Bjornstad, MD  mirtazapine (REMERON) 15 MG tablet Take 1 tablet (15 mg total) by mouth at bedtime. 06/09/15  Yes Abner Greenspan, MD  Multiple Vitamin (MULTIVITAMIN) tablet Take 1 tablet by mouth daily.    Yes Historical Provider, MD  pantoprazole (PROTONIX) 40 MG tablet Take 1 tablet (40 mg total) by mouth daily. 09/23/14  Yes Abner Greenspan, MD  potassium chloride (K-DUR,KLOR-CON) 10 MEQ tablet Take 10 mEq by mouth every other day.   Yes Historical Provider, MD  senna-docusate (SENOKOT-S) 8.6-50 MG per tablet Take 1 tablet by mouth at bedtime.   Yes Historical Provider, MD  simvastatin (ZOCOR) 5 MG tablet Take 1 tablet (5 mg total) by mouth every evening. 03/13/15  Yes Tonia Ghent, MD  vitamin B-12 (CYANOCOBALAMIN) 1000 MCG tablet Take 1 tablet (1,000 mcg total) by mouth daily. 06/13/15  Yes Abner Greenspan, MD  warfarin (COUMADIN) 7.5 MG tablet Take 7.5 mg by mouth daily. Except Mon. And Friday.   Yes Historical Provider, MD    Review of Systems  Chronic, ongoing fatigue and DOE.  She denies chest pain, palpitations, pnd, orthopnea, wheezing, n, v, dizziness, syncope, edema, weight gain, or early  satiety.  All other systems reviewed and are otherwise negative except as noted above.  Physical Exam  VS:  BP 120/62 mmHg  Pulse 80  Ht 5' 7.5" (1.715 m)  Wt 125 lb (56.7 kg)  BMI 19.28 kg/m2 , BMI Body mass index is 19.28 kg/(m^2). GEN: Frail, in no acute distress. HEENT: normal. Neck: Supple, no JVD, carotid bruits, or masses. Cardiac: IR, IR, rate-controlled, 2/6 syst murmur along left sternal border, no rubs, or gallops. No clubbing,  cyanosis, edema.  Radials/DP/PT 2+ and equal bilaterally.  Respiratory:  Respirations regular and unlabored, bibasilar crackles. GI: Soft, nontender, nondistended, BS + x 4. MS: no deformity or atrophy. Skin: warm and dry, no rash. Neuro:  Strength and sensation are intact. Psych: Normal affect.  Accessory Clinical Findings  ECG - AF, 80, LAD, LBBB - no acute st/t changes.  Assessment & Plan  1.  ICM/Chronic systolic CHF:  Patient continues to have fatigue and DOE in the setting of euvolemia and recent non-ischemic myoview.  She has not been particularly active @ her assisted living and I wonder to what extent inactivity is begetting more inactivity and fatigue.  Her dtr has indicated that she was told that additional PT/OT could be made available and I think that that is a good idea.  She does have crackles on exam, however her volume status otherwise looks good, thus I think crackles are more likely related to atelectasis in the setting of inactivity.  I've encouraged her to use an incentive spirometer during commercial breaks while watching TV (her dtr has one and is going to give it to her).  She remains on and is tolerating BB/acei therapy.  She would only use lasix for wt gain, which hasn't been an issue.  2.  CAD:  No recent angina.  Myoveiw was non-ischemic, thus I would not pursue further ischemic w/u or invasive eval.  Cont bb and statin.  She is not on ASA as she is on coumadin.  3.  Essential HTN: stable.  4.  Deconditioning:  I suspect  this may be playing a role in her fatigue, though at 89 and with an EF of 61-47%, it's certainly difficult to tell.  Her dtr is going to look into therapy options @ Assisted living.  5. Persistent Atrial Fibrillation:  Rate-controlled on bb.  Cont coumadin.  6.  Dispo:  F/u Dr. Rockey Situ as scheduled in 1 month.  Murray Hodgkins, NP 07/08/2015, 1:08 PM

## 2015-07-09 ENCOUNTER — Other Ambulatory Visit: Payer: Self-pay | Admitting: Family Medicine

## 2015-07-11 ENCOUNTER — Other Ambulatory Visit: Payer: Commercial Managed Care - HMO

## 2015-07-13 ENCOUNTER — Telehealth: Payer: Self-pay | Admitting: Family Medicine

## 2015-07-13 DIAGNOSIS — E538 Deficiency of other specified B group vitamins: Secondary | ICD-10-CM | POA: Insufficient documentation

## 2015-07-13 DIAGNOSIS — I10 Essential (primary) hypertension: Secondary | ICD-10-CM

## 2015-07-13 NOTE — Telephone Encounter (Signed)
-----   Message from Ellamae Sia sent at 07/08/2015  2:29 PM EDT ----- Regarding: Lab orders for Monday, 10.31.16 Lab orders , no f/u up appt

## 2015-07-14 ENCOUNTER — Ambulatory Visit (INDEPENDENT_AMBULATORY_CARE_PROVIDER_SITE_OTHER): Payer: Commercial Managed Care - HMO | Admitting: *Deleted

## 2015-07-14 ENCOUNTER — Other Ambulatory Visit (INDEPENDENT_AMBULATORY_CARE_PROVIDER_SITE_OTHER): Payer: Commercial Managed Care - HMO

## 2015-07-14 DIAGNOSIS — I482 Chronic atrial fibrillation, unspecified: Secondary | ICD-10-CM

## 2015-07-14 DIAGNOSIS — I10 Essential (primary) hypertension: Secondary | ICD-10-CM | POA: Diagnosis not present

## 2015-07-14 DIAGNOSIS — E538 Deficiency of other specified B group vitamins: Secondary | ICD-10-CM | POA: Diagnosis not present

## 2015-07-14 DIAGNOSIS — Z7901 Long term (current) use of anticoagulants: Secondary | ICD-10-CM | POA: Diagnosis not present

## 2015-07-14 LAB — VITAMIN B12: Vitamin B-12: 754 pg/mL (ref 211–911)

## 2015-07-14 LAB — BASIC METABOLIC PANEL
BUN: 26 mg/dL — AB (ref 6–23)
CHLORIDE: 105 meq/L (ref 96–112)
CO2: 26 mEq/L (ref 19–32)
Calcium: 9.7 mg/dL (ref 8.4–10.5)
Creatinine, Ser: 1.17 mg/dL (ref 0.40–1.20)
GFR: 46 mL/min — AB (ref >60.00)
Glucose, Bld: 112 mg/dL — ABNORMAL HIGH (ref 70–99)
POTASSIUM: 4.6 meq/L (ref 3.5–5.1)
SODIUM: 142 meq/L (ref 135–145)

## 2015-07-14 LAB — POCT INR: INR: 1.8

## 2015-07-14 NOTE — Progress Notes (Signed)
Pre visit review using our clinic review tool, if applicable. No additional management support is needed unless otherwise documented below in the visit note. 

## 2015-07-15 ENCOUNTER — Other Ambulatory Visit: Payer: Self-pay | Admitting: *Deleted

## 2015-07-15 MED ORDER — CYANOCOBALAMIN 1000 MCG/ML IJ KIT: 1.0000 mL | PACK | INTRAMUSCULAR | Status: DC

## 2015-07-15 NOTE — Addendum Note (Signed)
Addended by: Tammi Sou on: 07/15/2015 03:39 PM   Modules accepted: Orders

## 2015-07-31 ENCOUNTER — Ambulatory Visit (INDEPENDENT_AMBULATORY_CARE_PROVIDER_SITE_OTHER): Payer: Commercial Managed Care - HMO | Admitting: Cardiovascular Disease

## 2015-07-31 ENCOUNTER — Encounter: Payer: Self-pay | Admitting: Cardiovascular Disease

## 2015-07-31 VITALS — BP 117/76 | HR 75 | Ht 68.0 in | Wt 125.2 lb

## 2015-07-31 DIAGNOSIS — I255 Ischemic cardiomyopathy: Secondary | ICD-10-CM

## 2015-07-31 DIAGNOSIS — I482 Chronic atrial fibrillation, unspecified: Secondary | ICD-10-CM

## 2015-07-31 DIAGNOSIS — I200 Unstable angina: Secondary | ICD-10-CM

## 2015-07-31 DIAGNOSIS — R5382 Chronic fatigue, unspecified: Secondary | ICD-10-CM

## 2015-07-31 DIAGNOSIS — R531 Weakness: Secondary | ICD-10-CM

## 2015-07-31 DIAGNOSIS — I272 Other secondary pulmonary hypertension: Secondary | ICD-10-CM

## 2015-07-31 DIAGNOSIS — I2510 Atherosclerotic heart disease of native coronary artery without angina pectoris: Secondary | ICD-10-CM

## 2015-07-31 DIAGNOSIS — I5022 Chronic systolic (congestive) heart failure: Secondary | ICD-10-CM

## 2015-07-31 DIAGNOSIS — I951 Orthostatic hypotension: Secondary | ICD-10-CM

## 2015-07-31 NOTE — Assessment & Plan Note (Signed)
No medication changes made at this time, Likely has elevated right heart pressures but is not symptomatic

## 2015-07-31 NOTE — Assessment & Plan Note (Signed)
Denies leg edema, shortness of breath Only takes Lasix as needed for weight gain

## 2015-07-31 NOTE — Assessment & Plan Note (Signed)
Prior history of orthostatic hypotension Takes midodrine 5 mg twice a day No symptoms of lightheadedness or dizziness

## 2015-07-31 NOTE — Assessment & Plan Note (Signed)
weakness and fatigue improving. No longer working with physical therapy but tries to walk daily Walks with a walker

## 2015-07-31 NOTE — Assessment & Plan Note (Signed)
Currently with no symptoms of angina. No further workup at this time. Continue current medication regimen. 

## 2015-07-31 NOTE — Patient Instructions (Signed)
You are doing well. No medication changes were made.  Please call us if you have new issues that need to be addressed before your next appt.  Your physician wants you to follow-up in: 6 months.  You will receive a reminder letter in the mail two months in advance. If you don't receive a letter, please call our office to schedule the follow-up appointment.   

## 2015-07-31 NOTE — Progress Notes (Signed)
Patient ID: [name], female    DOB: 1922/06/01, 81 y.o.   MRN: [id number]  HPI Comments: 81 y.o. female with h/o coronary artery disease, atrial fibrillation on Coumadin monitored regularly by Dr. Ron Parker, chronic combined systolic and diastolic CHF, mitral regurgitation, tricuspid regurgitation, prolonged QT, LBBB, carotid disease, venous insufficiency, HTN, thrombocytopenia, and calorie malnutrition presented to the ED on 04/07/15 with complaints of nausea, bloody emesis, burning chest pain that extended into the neck, shortness of breath, and worsening weakness and  productive cough.  Echocardiogram at the time showed moderate pulmonary hypertension, significant TR, ejection fraction was reduced 30-35% Outpatient stress test showed no ischemia, anteroseptal perfusion defect consistent with bundle branch block She presents today for follow-up of her acute on chronic systolic and diastolic CHF and pulmonary hypertension  She is currently living at [facility] [place] Only takes Lasix as needed for weight gain She does take midodrine in the morning and evening for orthostatic hypotension Overall is in good spirits, walking with a walker She has a sitter in the morning and evening Daughter presents with her today and reports that overall she is doing well, goes down to the cafeteria at lunch time. Sleeps well, no shortness of breath on exertion, slowly getting stronger, no recent falls.  EKG on today's visit shows atrial fibrillation with rate 75 bpm, intraventricular conduction delay  Other past medical history  Echo 04/07/15 showed normal LV size, EF 30-35% (down from Echo on 02/28/13: EF 50-55%), hypokenesis of the anteroseptal myocardium, MV moderate regurg, mildly dilated left atrium and right ventricle, moderate to severe TR, and PA peak pressure 57 mmHg.     Allergies  Allergen Reactions  . Sulfonamide Derivatives Nausea And Vomiting    REACTION: sick  . Alendronate Sodium Other (See  Comments)    REACTION: GI    Current Outpatient Prescriptions on File Prior to Visit  Medication Sig Dispense Refill  . acetaminophen (TYLENOL) 325 MG tablet Take 650 mg by mouth every 6 (six) hours as needed.    . calcium carbonate (OS-CAL) 600 MG TABS tablet Take 1,200 mg by mouth at bedtime.    . Cyanocobalamin 1000 MCG/ML KIT Inject 1 mL as directed every 8 (eight) weeks. 1 kit 0  . ferrous sulfate 325 (65 FE) MG tablet Take 325 mg by mouth daily. With lunch    . fluticasone (FLONASE) 50 MCG/ACT nasal spray Place 2 sprays into both nostrils daily. (Patient taking differently: Place 2 sprays into both nostrils as needed. ) 16 g 11  . furosemide (LASIX) 20 MG tablet Take 1 tablet (20 mg total) by mouth daily as needed. 90 tablet 3  . Glucosamine HCl 500 MG TABS Take 1 tablet by mouth daily.    [name] levothyroxine (SYNTHROID, LEVOTHROID) 88 MCG tablet TAKE 1 TABLET EVERY DAY 90 tablet 1  . lisinopril (PRINIVIL,ZESTRIL) 2.5 MG tablet Take 1 tablet (2.5 mg total) by mouth at bedtime. 90 tablet 3  . Lutein 6 MG CAPS Take 1 capsule by mouth daily.     . metoprolol tartrate (LOPRESSOR) 25 MG tablet Take 25 mg by mouth 2 (two) times daily.    . midodrine (PROAMATINE) 5 MG tablet Take 1 tablet (5 mg total) by mouth 2 (two) times daily with a meal. 180 tablet 1  . mirtazapine (REMERON) 15 MG tablet Take 1 tablet (15 mg total) by mouth at bedtime. 90 tablet 3  . Multiple Vitamin (MULTIVITAMIN) tablet Take 1 tablet by mouth daily.     [name]  pantoprazole (PROTONIX) 40 MG tablet Take 1 tablet (40 mg total) by mouth daily. 90 tablet 3  . potassium chloride (K-DUR,KLOR-CON) 10 MEQ tablet Take 10 mEq by mouth every other day.    . senna-docusate (SENOKOT-S) 8.6-50 MG per tablet Take 1 tablet by mouth at bedtime.    . simvastatin (ZOCOR) 20 MG tablet TAKE 1/2 TABLET  (10MG  TOTAL) EVERY EVENING 45 tablet 1  . vitamin B-12 (CYANOCOBALAMIN) 1000 MCG tablet Take 1 tablet (1,000 mcg total) by mouth daily. 30 tablet 0   . warfarin (COUMADIN) 7.5 MG tablet Take 7.5 mg by mouth daily. Except Mon. And Friday.     No current facility-administered medications on file prior to visit.    Past Medical History  Diagnosis Date  . Chronic atrial fibrillation (HCC)     a. on Coumadin (CHA2DS2VASc = 6); b. Holter 06/2010, no brady, mild tachy.  . CAD (coronary artery disease)     a. 2 DES, [facility], 2004; b. nuc 2013 septal dyssenergy 2/2 LBBB, nl LV fxn;  c. 06/2015 MV: EF 41%, septal HK (LBBB), no ischemia->Low risk.  . Pleural effusion associated with pulmonary infection 03-2007  . Hypothyroid   . Hyperlipidemia   . GERD (gastroesophageal reflux disease)   . Diverticulosis of colon   . Allergy   . Anemia   . Arthritis     osteo  . Hypertension   . COPD (chronic obstructive pulmonary disease) ([place])   . Mitral regurgitation   . Vitamin B deficiency   . Compression fracture of lumbar vertebra ([place]) 05-2008  . Osteoporosis   . Thrombocytopenia ([place])   . OA (osteoarthritis)   . LBBB (left bundle branch block)   . Hx of colonoscopy   . Warfarin anticoagulation   . TR (tricuspid regurgitation)     moderate, echo 2008  . Dyslipidemia   . Lung abnormality     Dr [name] 122/2011 no further w/u needed  . Prolonged QT interval     when on sotolol in past  . Essential hypertension   . Weight loss     August, 2012  . Nausea     Some nausea with medications, May, 2013,  . Orthostatic hypotension     June, 2014  . Shingles   . Ischemic cardiomyopathy     a. 03/2015 Echo: EF 30-35%, ant, antsept HK, mod MR, mildly dil LA/RV, mod dil RA, mod-sev TR, PASP 47mHg.  .Marland KitchenHematemesis     a. 03/2015.  . [place]     a. 03/2015 PASP 570mg by echo.    Past Surgical History  Procedure Laterality Date  . Thyroidectomy  1962  . Tonsillectomy      Social History  reports that she quit smoking about 67 years ago. She has never used smokeless tobacco. She reports that she does not drink alcohol or use  illicit drugs.  Family History family history includes Cancer in her brother; Diabetes in her brother; Heart disease in her sister; Heart disease (age of onset: 7639in her mother; Leukemia in her sister; Stroke (age of onset: 8252in her father.    Review of Systems  Eyes: Negative.   Respiratory: Negative.   Cardiovascular: Negative.   Gastrointestinal: Negative.   Endocrine: Negative.   Musculoskeletal: Positive for gait problem.  Skin: Negative.   Allergic/Immunologic: Negative.   Neurological: Positive for weakness.  Hematological: Negative.   Psychiatric/Behavioral: Negative.   All other systems reviewed and are negative.   BP  117/76 mmHg  Pulse 75  Ht 5' 8"  (1.727 m)  Wt 125 lb 4 oz (56.813 kg)  BMI 19.05 kg/m2   Physical Exam  Constitutional: She is oriented to person, place, and time. She appears well-developed and well-nourished.  HENT:  Head: Normocephalic.  Nose: Nose normal.  Mouth/Throat: Oropharynx is clear and moist.  Eyes: Conjunctivae are normal. Pupils are equal, round, and reactive to light.  Neck: Normal range of motion. Neck supple. No JVD present.  Cardiovascular: Normal rate, regular rhythm, normal heart sounds and intact distal pulses.  Exam reveals no gallop and no friction rub.   No murmur heard. Pulmonary/Chest: Effort normal and breath sounds normal. No respiratory distress. She has no wheezes. She has no rales. She exhibits no tenderness.  Abdominal: Soft. Bowel sounds are normal. She exhibits no distension. There is no tenderness.  Musculoskeletal: Normal range of motion. She exhibits no edema or tenderness.  Lymphadenopathy:    She has no cervical adenopathy.  Neurological: She is alert and oriented to person, place, and time. Coordination normal.  Skin: Skin is warm and dry. No rash noted. No erythema.  Psychiatric: She has a normal mood and affect. Her behavior is normal. Judgment and thought content normal.

## 2015-07-31 NOTE — Assessment & Plan Note (Signed)
Currently with no symptoms of angina Recent pharmacologic stress test with no significant ischemia. No further workup at this time

## 2015-08-01 ENCOUNTER — Telehealth: Payer: Self-pay | Admitting: *Deleted

## 2015-08-01 NOTE — Telephone Encounter (Signed)
Pt daughter calling stating Yesterday when they came Dr Rockey Situ had mentioned to keep an eye on pt BP May need to give her one more pill of Midodrine  They usually give pt a AM pill and a PM When the home sitter came and checked her BP at 930am BP it was 131/77 Then after they gave her a pill and then it went to 90/53 at 10:10am Not sure if they need to give pt a Afternoon pill or not Or if they need to give the medication at different times in the day. Please advise

## 2015-08-01 NOTE — Telephone Encounter (Signed)
Patient's daughter [name] called again with bp readings for today 08/01/15  9:30 am  131/77 p 80 10:10 am  90/53  Sitting       11:40 am  101/57 p 78 sitting 11:45 am  123/94 p 90 standing

## 2015-08-01 NOTE — Telephone Encounter (Signed)
Pt's daughter would like to know if midodrine can be adjusted. Pt currently takes 5 mg BID, but her BP is dropping in the afternoon.  Would you prefer to move the pm dose to earlier in the afternoon or increase to TID?

## 2015-08-03 NOTE — Telephone Encounter (Signed)
Would take pill first think when she gets up, well before breakfast, Then try second pill before lunch

## 2015-08-04 NOTE — Telephone Encounter (Signed)
Spoke w/ pt's daughter, [name]. Advised her of Dr. Donivan Scull recommendation.  She reports that pt's anxiety has increased since last call to our office. Reports pt's SBP 147, sitter gave her midodrine and it shot up to the 170s. She will reiterate to sitter not to give unless BP is low.  She states that pt's BP has been improving after taking her Remeron. Advised her to continue to monitor and call back if sx recur.

## 2015-08-11 ENCOUNTER — Ambulatory Visit: Payer: Commercial Managed Care - HMO

## 2015-08-14 ENCOUNTER — Ambulatory Visit (INDEPENDENT_AMBULATORY_CARE_PROVIDER_SITE_OTHER): Payer: Commercial Managed Care - HMO | Admitting: *Deleted

## 2015-08-14 DIAGNOSIS — Z7901 Long term (current) use of anticoagulants: Secondary | ICD-10-CM

## 2015-08-14 DIAGNOSIS — I482 Chronic atrial fibrillation, unspecified: Secondary | ICD-10-CM

## 2015-08-14 LAB — POCT INR: INR: 2.6

## 2015-08-14 NOTE — Progress Notes (Signed)
Pre visit review using our clinic review tool, if applicable. No additional management support is needed unless otherwise documented below in the visit note. 

## 2015-08-19 ENCOUNTER — Telehealth: Payer: Self-pay

## 2015-08-19 NOTE — Telephone Encounter (Signed)
Pt caretaker states pt BP is dropping.  08/19/2015 10:50, sitting 112/69, standing 117/54; 10 am 119/74-sitting; 8:30 am 138/76-sitting, 141/76-standing

## 2015-08-19 NOTE — Telephone Encounter (Signed)
Numbers are looking much better.  Will continue to monitor.

## 2015-08-21 ENCOUNTER — Telehealth: Payer: Self-pay | Admitting: Family Medicine

## 2015-08-21 NOTE — Telephone Encounter (Signed)
Pt's daughter, Carolynne Edouard called in ref to a lift chair, transport chair, and something to lower bed for the pt.  [name] has paid for these items and is trying to get reimbursed from [facility] but [facility] says they need an authorization from from Dr. Glori Bickers.  She says she talked to you 2-3 weeks ago about it.  She requests c/b [phone]. Thank you.

## 2015-08-22 ENCOUNTER — Telehealth: Payer: Self-pay

## 2015-08-22 NOTE — Telephone Encounter (Signed)
Spoke w/ Melissa. She reports that pt's BP usually drops after taking AM meds. Reports pt's BP typically on waking 130-140/75-80. Pt has midodrine to take in the event that #s drop.  She states that she called last week w/ readings, advised her these #s looked good. She states that pt's BP has gotten as low as 98/70. She seemed alarmed at any readings below [id number] systolic, advised her that numbers above 90/60 are normal, as long as pt is asymptomatic. Also discussed pt's meds, as she was unclear as to the purpose of pt's metoprolol. She reports that pt's BP goes up w/ orthostatic changes, may need to observe her technique.  Advised her to continue to monitor and call w/ readings.  She is appreciative and will call back w/ any questions or concerns.

## 2015-08-22 NOTE — Telephone Encounter (Signed)
Pt's daughter called again and is requesting a cb please call [phone]

## 2015-08-22 NOTE — Telephone Encounter (Signed)
Pt daughter called, states this morning pt BP is low, per pt caretaker( in the 100s).  States when pt stands up, her BP goes up. She would like to discuss medications. Please call caretaker, Lenna Sciara, is with pt now [phone]. Pt daughter gives ok for [place] to talk with Melissa.

## 2015-08-27 NOTE — Telephone Encounter (Signed)
Somehow this message wasn't in my inbasket routed back to myself and will call pt's daughter asap

## 2015-08-27 NOTE — Telephone Encounter (Signed)
Pt's daughter advise I just got form and I will fill out form and send it to her insurance, pt will call me back tomorrow and let me know the date of purchase and the date of the order Dr. Glori Bickers did so I can make sure I put the correct information on form

## 2015-08-28 ENCOUNTER — Other Ambulatory Visit: Payer: Self-pay | Admitting: Family Medicine

## 2015-08-28 NOTE — Telephone Encounter (Signed)
Date of purchase  05/01/15  Profile box springs to lower bed 05/03/15 lift chair 05/05/15 transfer chair   rx for this wrote 05/12/15  rx code  2 use qdipm   m4-s  r5 or (s) 3.1  Any question please call [phone]

## 2015-09-01 ENCOUNTER — Telehealth: Payer: Self-pay

## 2015-09-01 ENCOUNTER — Telehealth: Payer: Self-pay | Admitting: *Deleted

## 2015-09-01 ENCOUNTER — Other Ambulatory Visit: Payer: Self-pay | Admitting: *Deleted

## 2015-09-01 MED ORDER — METOPROLOL TARTRATE 25 MG PO TABS: 25.0000 mg | ORAL_TABLET | Freq: Two times a day (BID) | ORAL | Status: DC

## 2015-09-01 NOTE — Telephone Encounter (Signed)
[name], pts daughter said pts metoprolol was changed by Dr Rockey Situ when pt was in hospital in July 2016 to metoprolol 50 mg taking 1/2 tab bid. [name] said she will ck with Dr Donivan Scull office about refill.if needed [name] will cb.

## 2015-09-01 NOTE — Telephone Encounter (Signed)
Rx sent to [facility]

## 2015-09-01 NOTE — Telephone Encounter (Signed)
*  STAT* If patient is at the pharmacy, call can be transferred to refill team.   1. Which medications need to be refilled? (please list name of each medication and dose if known)   metoprolol tartrate (LOPRESSOR) 25 MG tablet  25 mg, 2 times daily   2. Which pharmacy/location (including street and city if local pharmacy) is medication to be sent to? [facility]  3. Do they need a 30 day or 90 day supply?  90 day supply

## 2015-09-03 NOTE — Telephone Encounter (Signed)
Insurance advise me that we can't fill out form that the durable medical store pt received supplies has to fill out form, I advise pt's daughter and placed a blank form at the front for her to pick-up and give to the store per her insurance

## 2015-09-10 ENCOUNTER — Telehealth: Payer: Self-pay | Admitting: *Deleted

## 2015-09-10 NOTE — Telephone Encounter (Signed)
*  STAT* If patient is at the pharmacy, call can be transferred to refill team.   1. Which medications need to be refilled? (please list name of each medication and dose if known) metoprolol tartrate (LOPRESSOR) 25 MG tablet   2. Which pharmacy/location (including street and city if local pharmacy) is medication to be sent to? [facility]  3. Do they need a 30 day or 90 day supply? 90 day

## 2015-09-11 ENCOUNTER — Other Ambulatory Visit: Payer: Self-pay | Admitting: *Deleted

## 2015-09-11 MED ORDER — METOPROLOL TARTRATE 25 MG PO TABS: 25.0000 mg | ORAL_TABLET | Freq: Two times a day (BID) | ORAL | Status: DC

## 2015-09-11 NOTE — Telephone Encounter (Signed)
Metoprolol 90 day supply sent to [facility] for refill.

## 2015-09-16 ENCOUNTER — Other Ambulatory Visit: Payer: Commercial Managed Care - HMO

## 2015-09-18 ENCOUNTER — Ambulatory Visit (INDEPENDENT_AMBULATORY_CARE_PROVIDER_SITE_OTHER): Payer: PPO | Admitting: *Deleted

## 2015-09-18 DIAGNOSIS — Z7901 Long term (current) use of anticoagulants: Secondary | ICD-10-CM | POA: Diagnosis not present

## 2015-09-18 DIAGNOSIS — I482 Chronic atrial fibrillation, unspecified: Secondary | ICD-10-CM

## 2015-09-18 LAB — POCT INR: INR: 2.4

## 2015-09-18 NOTE — Progress Notes (Signed)
Pre visit review using our clinic review tool, if applicable. No additional management support is needed unless otherwise documented below in the visit note. 

## 2015-09-19 ENCOUNTER — Telehealth: Payer: Self-pay | Admitting: Family Medicine

## 2015-09-19 MED ORDER — AZITHROMYCIN 250 MG PO TABS: ORAL_TABLET | ORAL | Status: DC

## 2015-09-19 NOTE — Telephone Encounter (Signed)
[name] called stating that pt sitter called her Pt has temp of 100.3 Pt is coughing and has congestion They are giving her musenix dm she is coughing up [place] wanted to know what else you would advise her to do  [name] wanted to know if you would call her in something [facility]

## 2015-09-19 NOTE — Telephone Encounter (Signed)
Daughter notified of Dr. Marliss Coots comments/recommendations and verbalized understanding, Rx sent to pharmacy and coumadin appt scheduled, daughter advise if sxs worsen to take pt to ER

## 2015-09-19 NOTE — Telephone Encounter (Signed)
She is difficult to get out  Please call in zpack Continue the mucinex DM as tolerated If worse - ED / will need a CXR  Keep me posted   This may inc INR- will need re check next wk

## 2015-09-23 ENCOUNTER — Telehealth: Payer: Self-pay | Admitting: Family Medicine

## 2015-09-23 MED ORDER — BENZONATATE 200 MG PO CAPS: 200.0000 mg | ORAL_CAPSULE | Freq: Three times a day (TID) | ORAL | Status: DC | PRN

## 2015-09-23 NOTE — Telephone Encounter (Signed)
I will send tessalon to pharmacy on file Swallow pill  whole-do not bite it (will numb mouth if you bite it) F/u if no improvement

## 2015-09-23 NOTE — Telephone Encounter (Signed)
Spoke to patient's daughter and notified prescription has been sent to pharmacy.

## 2015-09-23 NOTE — Telephone Encounter (Signed)
[name] called stated ms Stroot is doing better with the zpak.  But she still has a very bad cough and coughing up white phlem.  This is aggrevating her throat.  Pt is taking musenix dm [name] wanted to know if you could call something in for cough  [facility] Please advise

## 2015-09-25 ENCOUNTER — Ambulatory Visit: Payer: PPO

## 2015-09-25 ENCOUNTER — Telehealth: Payer: Self-pay | Admitting: Family Medicine

## 2015-09-25 NOTE — Telephone Encounter (Signed)
Larkin Ina called needing to get cpt codes for the following items Low profile box springs Lift chair Transfer chair

## 2015-09-26 NOTE — Telephone Encounter (Signed)
Thanks so much. 

## 2015-09-26 NOTE — Telephone Encounter (Signed)
Dawn was able to get a hold of Larkin Ina at [employer] to find what they need.  She gave him the code for wheel chair and lift chair, but couldn't find code for the low profile box spring.  She says pt didn't get equipment from a medical supplier, but instead got it from a furniture store so she thinks they're just trying to get reimbursement. So it's all handled.  Thank you.

## 2015-09-26 NOTE — Telephone Encounter (Signed)
Rose, can you look these up for me? I do not have a book Thanks!

## 2015-09-26 NOTE — Telephone Encounter (Signed)
Dawn at [employer] is going to call Larkin Ina or [name] to find out exactly what he's wanting since no one knows another name for transfer chair or low profile box spring.  Thank you.

## 2015-09-29 ENCOUNTER — Ambulatory Visit (INDEPENDENT_AMBULATORY_CARE_PROVIDER_SITE_OTHER): Payer: PPO | Admitting: Family Medicine

## 2015-09-29 ENCOUNTER — Ambulatory Visit (INDEPENDENT_AMBULATORY_CARE_PROVIDER_SITE_OTHER)
Admission: RE | Admit: 2015-09-29 | Discharge: 2015-09-29 | Disposition: A | Discharge status: Home / Self Care | Payer: PPO | Source: Ambulatory Visit | Attending: Family Medicine | Admitting: Family Medicine

## 2015-09-29 ENCOUNTER — Encounter: Payer: Self-pay | Admitting: Family Medicine

## 2015-09-29 ENCOUNTER — Ambulatory Visit (INDEPENDENT_AMBULATORY_CARE_PROVIDER_SITE_OTHER): Payer: PPO | Admitting: *Deleted

## 2015-09-29 VITALS — BP 128/74 | HR 80 | Temp 97.4°F | Ht 67.0 in | Wt 124.5 lb

## 2015-09-29 DIAGNOSIS — M25512 Pain in left shoulder: Secondary | ICD-10-CM | POA: Diagnosis not present

## 2015-09-29 DIAGNOSIS — J069 Acute upper respiratory infection, unspecified: Secondary | ICD-10-CM

## 2015-09-29 DIAGNOSIS — R059 Cough, unspecified: Secondary | ICD-10-CM

## 2015-09-29 DIAGNOSIS — B9789 Other viral agents as the cause of diseases classified elsewhere: Principal | ICD-10-CM

## 2015-09-29 DIAGNOSIS — Z7901 Long term (current) use of anticoagulants: Secondary | ICD-10-CM | POA: Diagnosis not present

## 2015-09-29 DIAGNOSIS — R05 Cough: Secondary | ICD-10-CM | POA: Diagnosis not present

## 2015-09-29 LAB — POCT INR: INR: 5.2

## 2015-09-29 NOTE — Progress Notes (Signed)
Pre visit review using our clinic review tool, if applicable. No additional management support is needed unless otherwise documented below in the visit note. 

## 2015-09-29 NOTE — Assessment & Plan Note (Signed)
Ongoing with limited rom  Pt req shoulder xray  Pending radiology review

## 2015-09-29 NOTE — Progress Notes (Signed)
Subjective:    Patient ID: [name], female    DOB: [birth year], 82 y.o.   MRN: [id number]  HPI Here for uri   Was tx for zpak - last Thursday -got worse friday Also tessalon   Cough is bad -phlegm yellow to white  Tessalon pearles -helping  SOB - only when she exerts herself   Has had a temp- up to 101   Taking tylenol over the counter  Also bought mucinex DM -stopped that a few days ago   Had her flu shot   Shoulder has hurt for a long time   Patient Active Problem List   Diagnosis Date Noted  . Viral URI with cough 09/29/2015  . Vitamin B12 deficiency 07/13/2015  . Fatigue 06/09/2015  . Adjustment disorder with mixed anxiety and depressed mood 06/09/2015  . Generalized weakness 05/12/2015  . Chronic systolic CHF (congestive heart failure) ([place]) 04/29/2015  . Pain in the chest   . Cardiomyopathy, ischemic   . Acute systolic CHF (congestive heart failure) ([place])   . Pulmonary hypertension ([place])   . Adult failure to thrive   . Chronic atrial fibrillation ([place])   . Unstable angina ([place]) 04/07/2015  . Right sided sciatica 10/25/2014  . Venous stasis dermatitis 06/19/2014  . Varicose veins of lower extremities with inflammation 03/25/2014  . Cellulitis 03/08/2014  . Chronic diastolic CHF (congestive heart failure) ([place]) 01/04/2014  . Knee pain 08/27/2013  . Cough 07/03/2013  . Acute bronchitis 07/03/2013  . Orthostatic hypotension   . Acute on chronic combined systolic and diastolic heart failure ([place]) 03/02/2013  . Acute renal failure ([place]) 02/28/2013  . Dehydration 02/28/2013  . Shingles 02/28/2013  . Protein-calorie malnutrition, severe ([place]) 02/28/2013  . Nail fungus 01/10/2013  . Productive cough 02/29/2012  . Nausea   . Left shoulder pain 10/06/2011  . Lumbar pain 10/06/2011  . Hyperlipidemia 07/09/2011  . Vision blurred 07/09/2011  . Hearing loss 07/09/2011  . Weight loss   . A-fib ([place])   . Tachycardia   . CAD (coronary artery disease)   .  Mitral regurgitation   . SOB (shortness of breath)   . LBBB (left bundle branch block)   . Warfarin anticoagulation   . Ejection fraction   . TR (tricuspid regurgitation)   . Dyslipidemia   . Lung abnormality   . Prolonged QT interval   . Volume overload   . Coccydynia 12/03/2010  . Nonspecific (abnormal) findings on radiological and other examination of body structure 07/13/2010  . ABNORMAL CHEST XRAY 07/13/2010  . Shortness of breath 06/18/2010  . Essential hypertension 06/24/2009  . GOUT, UNSPECIFIED 03/20/2009  . GERD 03/18/2009  . THROMBOCYTOPENIA 01/03/2009  . ALLERGIC RHINITIS 12/05/2008  . HOARSENESS 06/07/2008  . MEDIASTINAL LYMPHADENOPATHY 05/13/2008  . Other diseases of lung, not elsewhere classified 04/18/2008  . Venous (peripheral) insufficiency 09/13/2007  . FREQUENCY, URINARY 07/19/2007  . VERTIGO, BENIGN PAROXYSMAL POSITION 05/11/2007  . TINNITUS NOS 05/11/2007  . OSTEOPOROSIS NOS 05/11/2007  . HYPOTHYROIDISM 04/11/2007  . ANEMIA, B12 DEFICIENCY 04/11/2007  . HYPOXEMIA 04/11/2007   Past Medical History  Diagnosis Date  . Chronic atrial fibrillation (HCC)     a. on Coumadin (CHA2DS2VASc = 6); b. Holter 06/2010, no brady, mild tachy.  . CAD (coronary artery disease)     a. 2 DES, [facility], 2004; b. nuc 2013 septal dyssenergy 2/2 LBBB, nl LV fxn;  c. 06/2015 MV: EF 41%, septal HK (LBBB), no ischemia->Low risk.  . Pleural effusion associated  with pulmonary infection 03-2007  . Hypothyroid   . Hyperlipidemia   . GERD (gastroesophageal reflux disease)   . Diverticulosis of colon   . Allergy   . Anemia   . Arthritis     osteo  . Hypertension   . COPD (chronic obstructive pulmonary disease) ([place])   . Mitral regurgitation   . Vitamin B deficiency   . Compression fracture of lumbar vertebra ([place]) 05-2008  . Osteoporosis   . Thrombocytopenia ([place])   . OA (osteoarthritis)   . LBBB (left bundle branch block)   . Hx of colonoscopy   . Warfarin anticoagulation   .  TR (tricuspid regurgitation)     moderate, echo 2008  . Dyslipidemia   . Lung abnormality     Dr [name] 122/2011 no further w/u needed  . Prolonged QT interval     when on sotolol in past  . Essential hypertension   . Weight loss     August, 2012  . Nausea     Some nausea with medications, May, 2013,  . Orthostatic hypotension     June, 2014  . Shingles   . Ischemic cardiomyopathy     a. 03/2015 Echo: EF 30-35%, ant, antsept HK, mod MR, mildly dil LA/RV, mod dil RA, mod-sev TR, PASP 75mHg.  .Marland KitchenHematemesis     a. 03/2015.  . Pulmonary hypertension ([place]     a. 03/2015 PASP 552mg by echo.   Past Surgical History  Procedure Laterality Date  . Thyroidectomy  1962  . Tonsillectomy     Social History  Substance Use Topics  . Smoking status: Former Smoker    Quit date: 09/14/1947  . Smokeless tobacco: Never Used  . Alcohol Use: No   Family History  Problem Relation Age of Onset  . Heart disease Mother 7654. Stroke Father 8258. Heart disease Sister   . Cancer Brother     colon  . Diabetes Brother   . Leukemia Sister    Allergies  Allergen Reactions  . Sulfonamide Derivatives Nausea And Vomiting    REACTION: sick  . Alendronate Sodium Other (See Comments)    REACTION: GI   Current Outpatient Prescriptions on File Prior to Visit  Medication Sig Dispense Refill  . acetaminophen (TYLENOL) 325 MG tablet Take 650 mg by mouth every 6 (six) hours as needed.    . benzonatate (TESSALON) 200 MG capsule Take 1 capsule (200 mg total) by mouth 3 (three) times daily as needed for cough (swallow whole, do not bite pill). 30 capsule 1  . calcium carbonate (OS-CAL) 600 MG TABS tablet Take 1,200 mg by mouth at bedtime.    . Cyanocobalamin 1000 MCG/ML KIT Inject 1 mL as directed every 8 (eight) weeks. 1 kit 0  . ferrous sulfate 325 (65 FE) MG tablet Take 325 mg by mouth daily. With lunch    . fluticasone (FLONASE) 50 MCG/ACT nasal spray Place 2 sprays into both nostrils daily. (Patient  taking differently: Place 2 sprays into both nostrils as needed. ) 16 g 11  . furosemide (LASIX) 20 MG tablet Take 1 tablet (20 mg total) by mouth daily as needed. 90 tablet 3  . Glucosamine HCl 500 MG TABS Take 1 tablet by mouth daily.    . Marland Kitchenevothyroxine (SYNTHROID, LEVOTHROID) 88 MCG tablet TAKE 1 TABLET EVERY DAY 90 tablet 1  . lisinopril (PRINIVIL,ZESTRIL) 2.5 MG tablet Take 1 tablet (2.5 mg total) by mouth at bedtime. 90 tablet 3  .  Lutein 6 MG CAPS Take 1 capsule by mouth daily.     . metoprolol tartrate (LOPRESSOR) 25 MG tablet Take 1 tablet (25 mg total) by mouth 2 (two) times daily. 180 tablet 3  . midodrine (PROAMATINE) 5 MG tablet Take 1 tablet (5 mg total) by mouth 2 (two) times daily with a meal. 180 tablet 1  . mirtazapine (REMERON) 15 MG tablet Take 1 tablet (15 mg total) by mouth at bedtime. 90 tablet 3  . Multiple Vitamin (MULTIVITAMIN) tablet Take 1 tablet by mouth daily.     . pantoprazole (PROTONIX) 40 MG tablet Take 1 tablet (40 mg total) by mouth daily. 90 tablet 3  . potassium chloride (K-DUR,KLOR-CON) 10 MEQ tablet TAKE 1 TABLET EVERY DAY 90 tablet 0  . senna-docusate (SENOKOT-S) 8.6-50 MG per tablet Take 1 tablet by mouth at bedtime.    . simvastatin (ZOCOR) 20 MG tablet TAKE 1/2 TABLET  (10MG  TOTAL) EVERY EVENING 45 tablet 1  . vitamin B-12 (CYANOCOBALAMIN) 1000 MCG tablet Take 1 tablet (1,000 mcg total) by mouth daily. 30 tablet 0  . warfarin (COUMADIN) 7.5 MG tablet Take 7.5 mg by mouth daily. Except Mon. And Friday.     No current facility-administered medications on file prior to visit.     Review of Systems Review of Systems  Constitutional: Negative for fever, appetite change, and unexpected weight change. pos for malaise ENT pos for mildly runny nose/neg for sinus pain  Eyes: Negative for pain and visual disturbance.  Respiratory: Negative for wheeze and shortness of breath.   Cardiovascular: Negative for cp or palpitations    Gastrointestinal: Negative  for nausea, diarrhea and constipation.  Genitourinary: Negative for urgency and frequency.  Skin: Negative for pallor or rash   Neurological: Negative for weakness, light-headedness, numbness and headaches.  Hematological: Negative for adenopathy. Does not bruise/bleed easily.  Psychiatric/Behavioral: Negative for dysphoric mood. The patient is not nervous/anxious.         Objective:   Physical Exam  Constitutional: She appears well-developed and well-nourished. No distress.  Frail appearing elderly female in good spirits  HENT:  Head: Normocephalic and atraumatic.  Right Ear: External ear normal.  Left Ear: External ear normal.  Mouth/Throat: Oropharynx is clear and moist.  Nares are injected and congested -mild clear rhinorrhea  No sinus tenderness   Eyes: Conjunctivae and EOM are normal. Pupils are equal, round, and reactive to light. Right eye exhibits no discharge. Left eye exhibits no discharge.  Neck: Normal range of motion. Neck supple.  Cardiovascular: Normal rate and normal heart sounds.   Pulmonary/Chest: Effort normal and breath sounds normal. No respiratory distress. She has no wheezes. She has no rales. She exhibits no tenderness.  Harsh bs Good air exch No rales or rhonchi  Musculoskeletal: She exhibits no edema.  Lymphadenopathy:    She has no cervical adenopathy.  Neurological: She is alert.  Skin: Skin is warm and dry. No rash noted.  Psychiatric: She has a normal mood and affect.          Assessment & Plan:   Problem List Items Addressed This Visit      Respiratory   Viral URI with cough    Cough persists s/p tx with zpack  Tessalon is helping  So is mucinex DM Reassuring exam today with nl pulse ox as well  CXR appears baseline -pending rad review   Will continue to follow closely as she is at high risk of pneumonia  Other   Cough - Primary   Relevant Orders   DG Chest 2 View (Completed)   Left shoulder pain    Ongoing with  limited rom  Pt req shoulder xray  Pending radiology review      Relevant Orders   DG Shoulder Left (Completed)

## 2015-09-29 NOTE — Assessment & Plan Note (Signed)
Cough persists s/p tx with zpack  Tessalon is helping  So is mucinex DM Reassuring exam today with nl pulse ox as well  CXR appears baseline -pending rad review   Will continue to follow closely as she is at high risk of pneumonia

## 2015-09-29 NOTE — Patient Instructions (Signed)
Lung sound ok  We will contact you with radiology reading of xrays  Vital signs are stable I think you are likely in the second half of this respiratory virus Continue tessalon Also get back on mucinex DM Update if not starting to improve in a week or if worsening

## 2015-10-09 ENCOUNTER — Ambulatory Visit (INDEPENDENT_AMBULATORY_CARE_PROVIDER_SITE_OTHER): Payer: PPO | Admitting: *Deleted

## 2015-10-09 DIAGNOSIS — Z7901 Long term (current) use of anticoagulants: Secondary | ICD-10-CM

## 2015-10-09 DIAGNOSIS — I482 Chronic atrial fibrillation, unspecified: Secondary | ICD-10-CM

## 2015-10-09 LAB — POCT INR: INR: 3.8

## 2015-10-09 NOTE — Progress Notes (Signed)
INR remains elevated despite holding 2 doses after completing abx last week.  No extra doses or changes in other medications.  Patient states she has not been consistent with her intake of "greens".  We discussed the importance of consistency to avoid fluctuations in her INR.  Both patient and caregiver verbalized understanding.  Coumadin dose decreased to 5 mg daily except 7.5 mg on Mondays, Wednesdays, and Fridays with a recheck scheduled in 2 weeks.

## 2015-10-09 NOTE — Progress Notes (Signed)
Pre visit review using our clinic review tool, if applicable. No additional management support is needed unless otherwise documented below in the visit note. 

## 2015-10-16 ENCOUNTER — Ambulatory Visit: Payer: PPO

## 2015-10-23 ENCOUNTER — Ambulatory Visit: Payer: PPO

## 2015-10-23 ENCOUNTER — Other Ambulatory Visit: Payer: Self-pay | Admitting: *Deleted

## 2015-10-23 MED ORDER — METOPROLOL TARTRATE 25 MG PO TABS: 25.0000 mg | ORAL_TABLET | Freq: Two times a day (BID) | ORAL | Status: DC

## 2015-10-23 NOTE — Telephone Encounter (Signed)
Requested Prescriptions   Signed Prescriptions Disp Refills   metoprolol tartrate (LOPRESSOR) 25 MG tablet 180 tablet 3    Sig: Take 1 tablet (25 mg total) by mouth 2 (two) times daily.    Authorizing Provider: GOLLAN, TIMOTHY J    Ordering User: LOPEZ, MARINA C    

## 2015-10-24 ENCOUNTER — Other Ambulatory Visit: Payer: Self-pay | Admitting: *Deleted

## 2015-10-24 ENCOUNTER — Other Ambulatory Visit: Payer: Self-pay | Admitting: Cardiovascular Disease

## 2015-10-24 MED ORDER — WARFARIN SODIUM 7.5 MG PO TABS: 7.5000 mg | ORAL_TABLET | Freq: Every day | ORAL | Status: DC

## 2015-10-24 MED ORDER — SIMVASTATIN 20 MG PO TABS: ORAL_TABLET | ORAL | Status: DC

## 2015-10-24 MED ORDER — POTASSIUM CHLORIDE CRYS ER 10 MEQ PO TBCR: 10.0000 meq | EXTENDED_RELEASE_TABLET | Freq: Every day | ORAL | Status: DC

## 2015-10-24 MED ORDER — MIRTAZAPINE 15 MG PO TABS: 15.0000 mg | ORAL_TABLET | Freq: Every day | ORAL | Status: DC

## 2015-10-24 MED ORDER — LEVOTHYROXINE SODIUM 88 MCG PO TABS: 88.0000 ug | ORAL_TABLET | Freq: Every day | ORAL | Status: DC

## 2015-10-24 MED ORDER — PANTOPRAZOLE SODIUM 40 MG PO TBEC: 40.0000 mg | DELAYED_RELEASE_TABLET | Freq: Every day | ORAL | Status: DC

## 2015-10-24 NOTE — Telephone Encounter (Signed)
Received fax saying pt xfered Rxs from mail order to [facility], I refilled the Rxs I could, please advise (Coumadin sent to you because Barnett Applebaum is out of the office today)

## 2015-10-27 ENCOUNTER — Ambulatory Visit (INDEPENDENT_AMBULATORY_CARE_PROVIDER_SITE_OTHER): Payer: PPO | Admitting: Family Medicine

## 2015-10-27 ENCOUNTER — Encounter: Payer: Self-pay | Admitting: Family Medicine

## 2015-10-27 ENCOUNTER — Ambulatory Visit (INDEPENDENT_AMBULATORY_CARE_PROVIDER_SITE_OTHER): Payer: PPO | Admitting: *Deleted

## 2015-10-27 VITALS — BP 136/67 | HR 82 | Temp 97.5°F | Ht 67.0 in | Wt 125.5 lb

## 2015-10-27 DIAGNOSIS — M19012 Primary osteoarthritis, left shoulder: Secondary | ICD-10-CM

## 2015-10-27 DIAGNOSIS — M5441 Lumbago with sciatica, right side: Secondary | ICD-10-CM | POA: Diagnosis not present

## 2015-10-27 DIAGNOSIS — M25512 Pain in left shoulder: Secondary | ICD-10-CM

## 2015-10-27 DIAGNOSIS — M5137 Other intervertebral disc degeneration, lumbosacral region: Secondary | ICD-10-CM | POA: Diagnosis not present

## 2015-10-27 DIAGNOSIS — Z7901 Long term (current) use of anticoagulants: Secondary | ICD-10-CM | POA: Diagnosis not present

## 2015-10-27 DIAGNOSIS — G8929 Other chronic pain: Secondary | ICD-10-CM

## 2015-10-27 DIAGNOSIS — I482 Chronic atrial fibrillation, unspecified: Secondary | ICD-10-CM

## 2015-10-27 LAB — POCT INR: INR: 2.8

## 2015-10-27 MED ORDER — METHYLPREDNISOLONE ACETATE 40 MG/ML IJ SUSP
80.0000 mg | Freq: Once | INTRAMUSCULAR | Status: AC
  Administered 2015-10-27: 80 mg via INTRA_ARTICULAR

## 2015-10-27 NOTE — Patient Instructions (Signed)
Aspercreme 4% - cream   Or Lidocaine 4% patch: apply for 12 hours a day, then 12 hours off.

## 2015-10-27 NOTE — Progress Notes (Addendum)
Dr. Frederico Hamman T. Lathan Gieselman, MD, [facility] [place], [address] Phone: [phone] Fax: [phone]  10/27/2015  Patient: [name], MRN: [id number], DOB: 07/31/1922, 82 y.o.  Primary Physician:  Loura Pardon, MD   Chief Complaint  Patient presents with  . Shoulder Pain    Left  . Back Pain   Subjective:   [name] is a 82 y.o. very pleasant female patient who presents with the following:  Pleasant elderly patient who I've known for a few years, and his family I know well who presents with left-sided shoulder pain that is been intermittent for at least a year or 2.  She hasn't had any trauma or accident.  Plain films are reviewed which show a mild to moderate amount glenohumeral arthritis and a.c. joint arthritis.  No significant trauma or surgical history on the affected side.  She also has chronic back pain, and I seen her for this previously several years ago, and at that time we tried to get her a lidocaine patch.  Attempts were unsuccessful through her insurance.  She is also on Coumadin and is a heart patient with heart failure and pulmonary hypertension.  She is able to tolerate taking some Tylenol.  Her back pain is quite significant and she has significant radiculopathy down the right side.  L shoulder pain, ? Back pain.  inj double l  Past Medical History, Surgical History, Social History, Family History, Problem List, Medications, and Allergies have been reviewed and updated if relevant.  Patient Active Problem List   Diagnosis Date Noted  . Viral URI with cough 09/29/2015  . Vitamin B12 deficiency 07/13/2015  . Fatigue 06/09/2015  . Adjustment disorder with mixed anxiety and depressed mood 06/09/2015  . Generalized weakness 05/12/2015  . Chronic systolic CHF (congestive heart failure) ([facility]) 04/29/2015  . Pain in the chest   . Cardiomyopathy, ischemic   . Acute systolic CHF (congestive  heart failure) ([place])   . Pulmonary hypertension ([place])   . Adult failure to thrive   . Chronic atrial fibrillation ([place])   . Unstable angina ([place]) 04/07/2015  . Right sided sciatica 10/25/2014  . Venous stasis dermatitis 06/19/2014  . Varicose veins of lower extremities with inflammation 03/25/2014  . Cellulitis 03/08/2014  . Chronic diastolic CHF (congestive heart failure) ([place]) 01/04/2014  . Knee pain 08/27/2013  . Cough 07/03/2013  . Acute bronchitis 07/03/2013  . Orthostatic hypotension   . Acute on chronic combined systolic and diastolic heart failure ([place]) 03/02/2013  . Acute renal failure ([place]) 02/28/2013  . Dehydration 02/28/2013  . Shingles 02/28/2013  . Protein-calorie malnutrition, severe ([place]) 02/28/2013  . Nail fungus 01/10/2013  . Productive cough 02/29/2012  . Nausea   . Left shoulder pain 10/06/2011  . Lumbar pain 10/06/2011  . Hyperlipidemia 07/09/2011  . Vision blurred 07/09/2011  . Hearing loss 07/09/2011  . Weight loss   . A-fib ([place])   . Tachycardia   . CAD (coronary artery disease)   . Mitral regurgitation   . SOB (shortness of breath)   . LBBB (left bundle branch block)   . Warfarin anticoagulation   . Ejection fraction   . TR (tricuspid regurgitation)   . Dyslipidemia   . Lung abnormality   . Prolonged QT interval   . Volume overload   . Coccydynia 12/03/2010  . Nonspecific (abnormal) findings on radiological and other examination of body structure 07/13/2010  . ABNORMAL CHEST XRAY  07/13/2010  . Shortness of breath 06/18/2010  . Essential hypertension 06/24/2009  . GOUT, UNSPECIFIED 03/20/2009  . GERD 03/18/2009  . THROMBOCYTOPENIA 01/03/2009  . ALLERGIC RHINITIS 12/05/2008  . HOARSENESS 06/07/2008  . MEDIASTINAL LYMPHADENOPATHY 05/13/2008  . Other diseases of lung, not elsewhere classified 04/18/2008  . Venous (peripheral) insufficiency 09/13/2007  . FREQUENCY, URINARY 07/19/2007  . VERTIGO, BENIGN PAROXYSMAL POSITION 05/11/2007  .  TINNITUS NOS 05/11/2007  . OSTEOPOROSIS NOS 05/11/2007  . HYPOTHYROIDISM 04/11/2007  . ANEMIA, B12 DEFICIENCY 04/11/2007  . HYPOXEMIA 04/11/2007    Past Medical History  Diagnosis Date  . Chronic atrial fibrillation (HCC)     a. on Coumadin (CHA2DS2VASc = 6); b. Holter 06/2010, no brady, mild tachy.  . CAD (coronary artery disease)     a. 2 DES, [facility], 2004; b. nuc 2013 septal dyssenergy 2/2 LBBB, nl LV fxn;  c. 06/2015 MV: EF 41%, septal HK (LBBB), no ischemia->Low risk.  . Pleural effusion associated with pulmonary infection 03-2007  . Hypothyroid   . Hyperlipidemia   . GERD (gastroesophageal reflux disease)   . Diverticulosis of colon   . Allergy   . Anemia   . Arthritis     osteo  . Hypertension   . COPD (chronic obstructive pulmonary disease) ([place])   . Mitral regurgitation   . Vitamin B deficiency   . Compression fracture of lumbar vertebra ([place]) 05-2008  . Osteoporosis   . Thrombocytopenia ([place])   . OA (osteoarthritis)   . LBBB (left bundle branch block)   . Hx of colonoscopy   . Warfarin anticoagulation   . TR (tricuspid regurgitation)     moderate, echo 2008  . Dyslipidemia   . Lung abnormality     Dr [name] 122/2011 no further w/u needed  . Prolonged QT interval     when on sotolol in past  . Essential hypertension   . Weight loss     August, 2012  . Nausea     Some nausea with medications, May, 2013,  . Orthostatic hypotension     June, 2014  . Shingles   . Ischemic cardiomyopathy     a. 03/2015 Echo: EF 30-35%, ant, antsept HK, mod MR, mildly dil LA/RV, mod dil RA, mod-sev TR, PASP 16mHg.  .[place] KitchenHematemesis     a. 03/2015.  . Pulmonary hypertension ([facility]     a. 03/2015 PASP 556mg by echo.    Past Surgical History  Procedure Laterality Date  . Thyroidectomy  1962  . Tonsillectomy      Social History   Social History  . Marital Status: Widowed    Spouse Name: N/A  . Number of Children: N/A  . Years of Education: N/A   Occupational History  . Not  on file.   Social History Main Topics  . Smoking status: Former Smoker    Quit date: 09/14/1947  . Smokeless tobacco: Never Used  . Alcohol Use: No  . Drug Use: No  . Sexual Activity: Not Currently   Other Topics Concern  . Not on file   Social History Narrative   Very independent    Lives in [place] herself           Family History  Problem Relation Age of Onset  . Heart disease Mother 7674. Stroke Father 8287. Heart disease Sister   . Cancer Brother     colon  . Diabetes Brother   . Leukemia Sister     Allergies  Allergen Reactions  . Sulfonamide Derivatives Nausea And Vomiting    REACTION: sick  . Alendronate Sodium Other (See Comments)    REACTION: GI    Medication list reviewed and updated in full in [facility].  GEN: No fevers, chills. Nontoxic. Primarily MSK c/o today. MSK: Detailed in the HPI GI: tolerating PO intake without difficulty Neuro: No numbness, parasthesias, or tingling associated. Otherwise the pertinent positives of the ROS are noted above.   Objective:   BP 136/67 mmHg  Pulse 82  Temp(Src) 97.5 F (36.4 C) (Oral)  Ht 5' 7"  (1.702 m)  Wt 125 lb 8 oz (56.926 kg)  BMI 19.65 kg/m2  SpO2 97%   GEN: WDWN, NAD, Non-toxic, Alert & Oriented x 3 HEENT: Atraumatic, Normocephalic.  Ears and Nose: No external deformity. EXTR: No clubbing/cyanosis/edema NEURO: Normal gait.  PSYCH: Normally interactive. Conversant. Not depressed or anxious appearing.  Calm demeanor.   Shoulder: L Inspection: No muscle wasting or winging Ecchymosis/edema: neg  AC joint, scapula, clavicle: NT CREPITUS WITH MOTION Abduction: full, 5/5 Flexion: full, 5/5 IR, full, lift-off: 5/5 ER at neutral: full, 5/5 AC crossover and compression: neg Neer: neg Hawkins: neg Drop Test: neg Empty Can: neg Supraspinatus insertion: NT Bicipital groove: NT Speed's: neg Yergason's: neg Sulcus sign: neg Scapular dyskinesis: none C5-T1 intact Sensation  intact Grip 5/5   Tender L3-s1 more on the R and i n the posterior bony pelvis SLR + sitting  Radiology: Dg Chest 2 View  09/29/2015  CLINICAL DATA:  Productive cough EXAM: CHEST  2 VIEW COMPARISON:  07/03/2013 FINDINGS: There is hyperinflation of the lungs compatible with COPD. Scarring in the apices. Cardiomegaly. No confluent airspace opacities or effusions. Prior vertebroplasty and surgical changes in the mid thoracic spine. IMPRESSION: COPD, cardiomegaly.  No active disease. Electronically Signed   By: Rolm Baptise M.D.   On: 09/29/2015 15:09   Dg Shoulder Left  09/29/2015  CLINICAL DATA:  Left shoulder pain EXAM: LEFT SHOULDER - 2+ VIEW COMPARISON:  10/06/2011 FINDINGS: Four views of the left shoulder submitted. There is diffuse osteopenia. No acute fracture or subluxation. Mild narrowing of glenohumeral joint space. Mild spurring of humeral head. IMPRESSION: No acute fracture or subluxation. Mild degenerative changes as described above. Diffuse osteopenia. Electronically Signed   By: Lahoma Crocker M.D.   On: 09/29/2015 15:15    CLINICAL DATA: Low back pain.  EXAM: LUMBAR SPINE - COMPLETE 4+ VIEW  COMPARISON: 10/06/2011  FINDINGS: Mild curvature, convex to the left with the apex at L2, stable.  The compression fracture of T12 has been treated with vertebroplasty since the prior study.  There are mild depressions of the upper endplates of L3 and L4, stable.  No new fractures. Bones are diffusely demineralized.  There is moderate to marked loss disc height at L5-S1. There is mild loss of disc height at L2-L3 through L3-L4. Facet joint narrowing is noted on the left at L3-L4 and L4-L5.  Bones are diffusely demineralized. There are dense calcifications noted along the abdominal aorta.  IMPRESSION: 1. No acute findings. No new fractures. 2. Old fractures of T12, L3 and L4. T12 has been treated with vertebroplasty since the prior study. 3. Degenerative changes and  diffuse bony demineralization, stable.   Assessment and Plan:   Glenohumeral arthritis, left  Left shoulder pain - Plan: methylPREDNISolone acetate (DEPO-MEDROL) injection 80 mg  Chronic right-sided low back pain with right-sided sciatica  DDD (degenerative disc disease), lumbosacral  Shoulder OA seems primary driver here.  DDD and chronic lumbar radiculopathy and ? Spinal stenosis is more challenging in a 82 year old. Lidocaine 4% is OTC now, which may help quite a bit.  Addendum: 12/10/2015 I just spoke to the patient's daughter, and she is doing very poorly from a back pain standpoint. Recalcitrant pain, radiculopathy failure of essentially all conservative measures including PT, topical NSAIDS, topical lidocaine, tylenol, and she is on coumadin. Obtain an MRI of the lumbar spine without contrast to evaluate for disc herniation, spinal stenosis, cord edema - with the primary goal of identifying area for potential ESI or other pain intervention. Electronically Signed  By: Owens Loffler, MD On: 12/10/2015 5:56 PM   Intrarticular Shoulder Injection, L Verbal consent was obtained from the patient. Risks including infection explained and contrasted with benefits and alternatives. Patient prepped with Chloraprep and Ethyl Chloride used for anesthesia. An intraarticular shoulder injection was performed using the posterior approach. The patient tolerated the procedure well and had decreased pain post injection. No complications. Injection: 4 cc of Lidocaine 1% and 1 mL Depo-Medrol 40 mg. Needle: 22 gauge   SubAC Injection, L Verbal consent was obtained from the patient. Risks (including rare infection), benefits, and alternatives were explained. Patient prepped with Chloraprep and Ethyl Chloride used for anesthesia. The subacromial space was injected using the posterior approach. The patient tolerated the procedure well and had decreased pain post injection. No complications. Injection: 4 cc  of Lidocaine 1% and 1 mL of Depo-Medrol 40 mg. Needle: 22 gauge    Follow-up: prn  Patient Instructions  Aspercreme 4% - cream   Or Lidocaine 4% patch: apply for 12 hours a day, then 12 hours off.       Signed,  Maud Deed. Yanisa Goodgame, MD   Patient's Medications  New Prescriptions   No medications on file  Previous Medications   ACETAMINOPHEN (TYLENOL) 325 MG TABLET    Take 650 mg by mouth every 6 (six) hours as needed.   BENZONATATE (TESSALON) 200 MG CAPSULE    Take 1 capsule (200 mg total) by mouth 3 (three) times daily as needed for cough (swallow whole, do not bite pill).   CALCIUM CARBONATE (OS-CAL) 600 MG TABS TABLET    Take 1,200 mg by mouth at bedtime.   CYANOCOBALAMIN 1000 MCG/ML KIT    Inject 1 mL as directed every 8 (eight) weeks.   FERROUS SULFATE 325 (65 FE) MG TABLET    Take 325 mg by mouth daily. With lunch   FLUTICASONE (FLONASE) 50 MCG/ACT NASAL SPRAY    Place 2 sprays into both nostrils daily.   FUROSEMIDE (LASIX) 20 MG TABLET    Take 1 tablet (20 mg total) by mouth daily as needed.   GLUCOSAMINE HCL 500 MG TABS    Take 1 tablet by mouth daily.   LEVOTHYROXINE (SYNTHROID, LEVOTHROID) 88 MCG TABLET    Take 1 tablet (88 mcg total) by mouth daily.   LISINOPRIL (PRINIVIL,ZESTRIL) 2.5 MG TABLET    TAKE ONE TABLET AT BEDTIME (8PM)   LUTEIN 6 MG CAPS    Take 1 capsule by mouth daily.    METOPROLOL TARTRATE (LOPRESSOR) 25 MG TABLET    Take 1 tablet (25 mg total) by mouth 2 (two) times daily.   MIDODRINE (PROAMATINE) 5 MG TABLET    Take 1 tablet (5 mg total) by mouth 2 (two) times daily with a meal.   MIRTAZAPINE (REMERON) 15 MG TABLET    Take 1 tablet (15 mg total) by mouth at bedtime.  MULTIPLE VITAMIN (MULTIVITAMIN) TABLET    Take 1 tablet by mouth daily.    PANTOPRAZOLE (PROTONIX) 40 MG TABLET    Take 1 tablet (40 mg total) by mouth daily.   POTASSIUM CHLORIDE (K-DUR,KLOR-CON) 10 MEQ TABLET    Take 1 tablet (10 mEq total) by mouth daily.   SENNA-DOCUSATE (SENOKOT-S)  8.6-50 MG PER TABLET    Take 1 tablet by mouth at bedtime.   SIMVASTATIN (ZOCOR) 20 MG TABLET    TAKE 1/2 TABLET  (10MG  TOTAL) EVERY EVENING   VITAMIN B-12 (CYANOCOBALAMIN) 1000 MCG TABLET    Take 1 tablet (1,000 mcg total) by mouth daily.   WARFARIN (COUMADIN) 7.5 MG TABLET    Take 1 tablet (7.5 mg total) by mouth daily. Except Mon. And Friday.  Modified Medications   No medications on file  Discontinued Medications   No medications on file

## 2015-10-27 NOTE — Progress Notes (Signed)
INR now therapeutic with dose increase 2 weeks ago.  Will continue with new dose and recheck in 4 weeks.

## 2015-10-27 NOTE — Progress Notes (Signed)
Pre visit review using our clinic review tool, if applicable. No additional management support is needed unless otherwise documented below in the visit note. 

## 2015-10-28 ENCOUNTER — Telehealth: Payer: Self-pay | Admitting: *Deleted

## 2015-10-28 NOTE — Telephone Encounter (Signed)
Pt's daughter notified of Dr. Marliss Coots comments and verbalized understanding

## 2015-10-28 NOTE — Telephone Encounter (Signed)
Don't take the gabapentin for now

## 2015-10-28 NOTE — Telephone Encounter (Signed)
Daughter said it's gabapentin 300mg , she wanted to see if pt should take this instead of the tylenol or is it okay to take with tylenol and lidocaine patches, please advise

## 2015-10-28 NOTE — Telephone Encounter (Signed)
A px for what? Sorry- I am confused

## 2015-10-28 NOTE — Telephone Encounter (Signed)
Patient saw Dr. Lorelei Pont yesterday for back and left shoulder pain.  She was given an injection in the shoulder and advised to pick up Lidocaine patches at the pharmacy.  Patient's daughter called stating she found a prescription of the patient's from July 2016.  Okay to take this in place of or in combination with the Tylenol for pain?  Please advise.

## 2015-10-30 ENCOUNTER — Other Ambulatory Visit: Payer: Self-pay | Admitting: *Deleted

## 2015-10-30 ENCOUNTER — Telehealth: Payer: Self-pay | Admitting: *Deleted

## 2015-10-30 MED ORDER — LISINOPRIL 2.5 MG PO TABS: ORAL_TABLET | ORAL | Status: DC

## 2015-10-30 NOTE — Telephone Encounter (Signed)
°*  STAT* If patient is at the pharmacy, call can be transferred to refill team.   1. Which medications need to be refilled? (please list name of each medication and dose if known) Lisinprolol   2. Which pharmacy/location (including street and city if local pharmacy) is medication to be sent to? [facility]   3. Do they need a 30 day or 90 day supply? 90 day   Pt daughter calling stating she went to pick up RX and they only gave her 30 days needs 90 day please to be sent

## 2015-11-11 DIAGNOSIS — H02052 Trichiasis without entropian right lower eyelid: Secondary | ICD-10-CM | POA: Diagnosis not present

## 2015-11-12 DIAGNOSIS — L821 Other seborrheic keratosis: Secondary | ICD-10-CM | POA: Diagnosis not present

## 2015-11-12 DIAGNOSIS — X32XXXA Exposure to sunlight, initial encounter: Secondary | ICD-10-CM | POA: Diagnosis not present

## 2015-11-12 DIAGNOSIS — D485 Neoplasm of uncertain behavior of skin: Secondary | ICD-10-CM | POA: Diagnosis not present

## 2015-11-12 DIAGNOSIS — L570 Actinic keratosis: Secondary | ICD-10-CM | POA: Diagnosis not present

## 2015-11-20 ENCOUNTER — Other Ambulatory Visit: Payer: Self-pay | Admitting: *Deleted

## 2015-11-20 DIAGNOSIS — H353131 Nonexudative age-related macular degeneration, bilateral, early dry stage: Secondary | ICD-10-CM | POA: Diagnosis not present

## 2015-11-20 MED ORDER — WARFARIN SODIUM 5 MG PO TABS: ORAL_TABLET | ORAL | Status: DC

## 2015-11-20 NOTE — Telephone Encounter (Signed)
error 

## 2015-11-24 ENCOUNTER — Ambulatory Visit (INDEPENDENT_AMBULATORY_CARE_PROVIDER_SITE_OTHER): Payer: PPO | Admitting: *Deleted

## 2015-11-24 DIAGNOSIS — Z7901 Long term (current) use of anticoagulants: Secondary | ICD-10-CM

## 2015-11-24 DIAGNOSIS — I482 Chronic atrial fibrillation, unspecified: Secondary | ICD-10-CM

## 2015-11-24 LAB — POCT INR: INR: 3.3

## 2015-11-24 NOTE — Progress Notes (Signed)
Pre visit review using our clinic review tool, if applicable. No additional management support is needed unless otherwise documented below in the visit note. 

## 2015-11-24 NOTE — Progress Notes (Signed)
INR is sub therapeutic today.  Patient reports a decreased intake of greens - she has gotten tired of eating salads every day.  No medication changes.  Will decrease dose to 5 mg today, then have her begin taking 5 mg daily except 7.5 mg on Mondays and Fridays.  Discussed with patient the need to remain consistent with her diet to avoid fluctuations in INR.  Will recheck in 3 weeks and make additional adjustments at that time if needed.

## 2015-12-10 ENCOUNTER — Telehealth: Payer: Self-pay

## 2015-12-10 NOTE — Telephone Encounter (Signed)
[name] pts daughter (DPR signed) left v/m; pt last seen 10/27/15; pt has had PT in the past and has tried the aspercream 4% with out relief of pain; pt told [name] this morning that pt does not want to live in pain the rest of her life;[name] wants to know if MRI would tell more about pts pain than an xray and wants to know if insurance would pay for MRI. [name] request cb.

## 2015-12-10 NOTE — Telephone Encounter (Signed)
D/w daughter - i am going to get am MRI of l spine at [facility]

## 2015-12-10 NOTE — Addendum Note (Signed)
Addended by: Owens Loffler on: 12/10/2015 05:58 PM   Modules accepted: Orders

## 2015-12-15 ENCOUNTER — Ambulatory Visit (INDEPENDENT_AMBULATORY_CARE_PROVIDER_SITE_OTHER): Payer: PPO | Admitting: *Deleted

## 2015-12-15 DIAGNOSIS — I482 Chronic atrial fibrillation, unspecified: Secondary | ICD-10-CM

## 2015-12-15 DIAGNOSIS — Z7901 Long term (current) use of anticoagulants: Secondary | ICD-10-CM | POA: Diagnosis not present

## 2015-12-15 LAB — POCT INR: INR: 2.7

## 2015-12-15 NOTE — Progress Notes (Signed)
Pre visit review using our clinic review tool, if applicable. No additional management support is needed unless otherwise documented below in the visit note. 

## 2016-01-02 ENCOUNTER — Ambulatory Visit
Admission: RE | Admit: 2016-01-02 | Discharge: 2016-01-02 | Disposition: A | Discharge status: Home / Self Care | Payer: PPO | Source: Ambulatory Visit | Attending: Family Medicine | Admitting: Family Medicine

## 2016-01-02 DIAGNOSIS — M4316 Spondylolisthesis, lumbar region: Secondary | ICD-10-CM | POA: Diagnosis not present

## 2016-01-02 DIAGNOSIS — M5126 Other intervertebral disc displacement, lumbar region: Secondary | ICD-10-CM | POA: Diagnosis not present

## 2016-01-02 DIAGNOSIS — M5137 Other intervertebral disc degeneration, lumbosacral region: Secondary | ICD-10-CM

## 2016-01-02 DIAGNOSIS — G8929 Other chronic pain: Secondary | ICD-10-CM | POA: Diagnosis not present

## 2016-01-02 DIAGNOSIS — M4806 Spinal stenosis, lumbar region: Secondary | ICD-10-CM | POA: Diagnosis not present

## 2016-01-02 DIAGNOSIS — M5441 Lumbago with sciatica, right side: Secondary | ICD-10-CM | POA: Insufficient documentation

## 2016-01-05 ENCOUNTER — Telehealth: Payer: Self-pay | Admitting: Family Medicine

## 2016-01-05 ENCOUNTER — Other Ambulatory Visit: Payer: Self-pay | Admitting: Family Medicine

## 2016-01-05 DIAGNOSIS — M48061 Spinal stenosis, lumbar region without neurogenic claudication: Secondary | ICD-10-CM

## 2016-01-05 DIAGNOSIS — M5416 Radiculopathy, lumbar region: Secondary | ICD-10-CM

## 2016-01-05 DIAGNOSIS — R531 Weakness: Secondary | ICD-10-CM

## 2016-01-05 DIAGNOSIS — M5126 Other intervertebral disc displacement, lumbar region: Secondary | ICD-10-CM

## 2016-01-05 NOTE — Telephone Encounter (Signed)
Left message for [name] to return my call.

## 2016-01-05 NOTE — Telephone Encounter (Signed)
Spoke with [employer].  She was wanting to know if we could refer her mom to [place].  Advised they are neurologist and we are referring her mom to a Chief of Staff.  Explained they are totally different.  I did go ahead and give her the address and phone number to [place].  She also wanted to know if this surgery would be a major surgery because she didn't feel like her mom could go through a big surgery at age 82.  Advised when they meet with the neurosurgeon, they will have already reviewed her mom's records and MRI.  They will take her age into consideration and come up with the best option for her mom.  She will await call for appointment.

## 2016-01-05 NOTE — Telephone Encounter (Signed)
Patient's daughter called back and wanted to talk to Butch Penny about MRI results.

## 2016-01-09 DIAGNOSIS — M4806 Spinal stenosis, lumbar region: Secondary | ICD-10-CM | POA: Diagnosis not present

## 2016-01-12 ENCOUNTER — Telehealth: Payer: Self-pay | Admitting: Family Medicine

## 2016-01-12 NOTE — Telephone Encounter (Signed)
[name] called back. 1st message she left with [name] she got confused. [name] wants to know if [name] has received [name] OV notes from last Friday 4/28? It had [name] recommendations on what needs to be done about [name]'s spine. [name] said that [name] would not move forward with any surgery without consulting [name], [name] and [name]. Please advise.

## 2016-01-12 NOTE — Telephone Encounter (Signed)
Neither Dr. Glori Bickers nor I have records from Dr. Joya Salm yet. It is Monday - if seen Friday, his notes will need to be typed and FAXED. We will be watching out for them - likely on their way.

## 2016-01-12 NOTE — Telephone Encounter (Signed)
LVM for [name]. Stated she spoke to Butch Penny last Wednesdays about MRI results and that [facility] will review her moms records and call her to schedule. Asked pt to call back if any further questions.

## 2016-01-12 NOTE — Telephone Encounter (Signed)
I think she was ref by Dr Lorelei Pont to neuro surg I will forward to Rosaria Ferries so she can update them thanks

## 2016-01-12 NOTE — Telephone Encounter (Signed)
[name] called checking the mri results and visit with [name]

## 2016-01-13 NOTE — Telephone Encounter (Signed)
Left message for [name] that neither Dr. Glori Bickers or Dr. Lorelei Pont has received records from Dr. Joya Salm yet.  Advised it can take a few days but we will be watching for them.

## 2016-01-14 NOTE — Telephone Encounter (Signed)
We are still awaiting notes from [facility], which are awaiting their dictation.

## 2016-01-15 ENCOUNTER — Ambulatory Visit (INDEPENDENT_AMBULATORY_CARE_PROVIDER_SITE_OTHER): Payer: PPO | Admitting: *Deleted

## 2016-01-15 DIAGNOSIS — I482 Chronic atrial fibrillation, unspecified: Secondary | ICD-10-CM

## 2016-01-15 DIAGNOSIS — Z7901 Long term (current) use of anticoagulants: Secondary | ICD-10-CM | POA: Diagnosis not present

## 2016-01-15 LAB — POCT INR: INR: 2.1

## 2016-01-15 NOTE — Progress Notes (Signed)
Pre visit review using our clinic review tool, if applicable. No additional management support is needed unless otherwise documented below in the visit note. 

## 2016-01-16 ENCOUNTER — Other Ambulatory Visit: Payer: Self-pay | Admitting: Cardiology

## 2016-01-19 NOTE — Telephone Encounter (Signed)
Notes received, reviewed, and will discuss with PCP today.

## 2016-01-21 ENCOUNTER — Telehealth: Payer: Self-pay | Admitting: Family Medicine

## 2016-01-21 NOTE — Telephone Encounter (Signed)
[name] called checking if you receive any paperwork from [name] neur surgery dr Kristeen Mans

## 2016-01-21 NOTE — Telephone Encounter (Signed)
[place] placed in Dr. Lillie Fragmin in box last week for review.

## 2016-01-21 NOTE — Telephone Encounter (Signed)
Attempted call again. Mailbox full.  Called yesterday as well. Notes reviewed and previously discussed case with Dr. Glori Bickers.

## 2016-01-22 NOTE — Telephone Encounter (Signed)
[name] returned Dr.Copland's call.  [name] can be reached at [phone].  [name] said she's sorry she didn't answer when you called.

## 2016-01-22 NOTE — Telephone Encounter (Signed)
Again attempted to try all listed numbers - no answer. No voicemail available. Obtained a home number from relative and left a voicemail.

## 2016-01-22 NOTE — Telephone Encounter (Signed)
Daughter Windy Kalata called back, would prefer Dr. Lorelei Pont call back on [phone] / lt

## 2016-01-22 NOTE — Telephone Encounter (Signed)
I am not sure what to do here - I have been trying to call her for the last several days, including again this AM. Never an answer and her mailbox remains full.   Let me know if you have suggestions on how to contact the patient / daughter.

## 2016-01-23 NOTE — Telephone Encounter (Signed)
Yesterday discussed at length with [name]. She asked my thoughts regarding her mother. Previously I discussed with my partner her PCP.  At 11 with significant CHF, very significant mortality and morbidity risk with a 2 level spine surgery. Chronically on coumadin. My thought is that only the patient and her daughter can ultimately decide her plan of care, but my advice was to be cautious. I did give her the opinion that if she were in my family, my recommendation would be to not have surgery. Certainly ESI and other interventional procedure even if only palliative for a time has much less risk.  She has f/u with her cardiologist next week, and Dr. Harley Hallmark notes indicate that any procedure at all would need cardiac approval. We have faxed his notes to cardiology.

## 2016-01-27 ENCOUNTER — Encounter: Payer: Self-pay | Admitting: Cardiovascular Disease

## 2016-01-27 ENCOUNTER — Ambulatory Visit (INDEPENDENT_AMBULATORY_CARE_PROVIDER_SITE_OTHER): Payer: PPO | Admitting: Cardiovascular Disease

## 2016-01-27 VITALS — BP 114/60 | HR 77 | Ht 67.0 in | Wt 122.0 lb

## 2016-01-27 DIAGNOSIS — I5022 Chronic systolic (congestive) heart failure: Secondary | ICD-10-CM | POA: Diagnosis not present

## 2016-01-27 DIAGNOSIS — M5440 Lumbago with sciatica, unspecified side: Secondary | ICD-10-CM | POA: Diagnosis not present

## 2016-01-27 DIAGNOSIS — I422 Other hypertrophic cardiomyopathy: Secondary | ICD-10-CM | POA: Diagnosis not present

## 2016-01-27 DIAGNOSIS — I482 Chronic atrial fibrillation, unspecified: Secondary | ICD-10-CM

## 2016-01-27 DIAGNOSIS — Z01810 Encounter for preprocedural cardiovascular examination: Secondary | ICD-10-CM | POA: Insufficient documentation

## 2016-01-27 NOTE — Assessment & Plan Note (Signed)
She appears euvolemic on today's visit We have recommended repeat echocardiogram. Suspect she had stress cardiomyopathy in 2016 that may have improved over the past year. No changes to her medications

## 2016-01-27 NOTE — Assessment & Plan Note (Addendum)
Acceptable risk for upcoming lumbar surgery Repeat echocardiogram pending. Suspect there will be improvement in ejection fraction compared to study one year ago in the setting of upper respiratory infection. Suspect stress car to myopathy at that time. Currently asymptomatic, and patient would like to have surgery given her severe symptoms. Recent stress test showing no ischemia 6 months ago. No further cardiac testing needed prior to surgery. We would stop warfarin in preparation for her back surgery when this has been scheduled    Total encounter time more than 25 minutes  Greater than 50% was spent in counseling and coordination of care with the patient

## 2016-01-27 NOTE — Progress Notes (Signed)
Patient ID: [name], female    DOB: [birth year], 82 y.o.   MRN: [id number]  HPI Comments: 82 y.o. female with h/o coronary artery disease, atrial fibrillation on Coumadin, chronic combined systolic and diastolic CHF, mitral regurgitation, tricuspid regurgitation, prolonged QT, LBBB, carotid disease, HTN, thrombocytopenia, presented to the ED on 04/07/15 with complaints of nausea, bloody emesis, burning chest pain that extended into the neck, shortness of breath, and worsening weakness and  productive cough.  Echocardiogram at the time showed moderate pulmonary hypertension, significant TR, ejection fraction was reduced 30-35% Outpatient stress test showed no ischemia, anteroseptal perfusion defect consistent with bundle branch block She presents today for follow-up of her acute on chronic systolic and diastolic CHF and pulmonary hypertension  In follow-up today, she reports that she feels well Denies any significant shortness of breath on exertion Reports having severe low back pain Recent MRI performed, seen by neurosurgery She is interested in having back surgery given her chronic pain, inability to ambulate well  MRI images reviewed with her in detail showing spinal stenosis, L3-L4 level Denies any symptoms concerning for angina She is taking Lasix only as needed for ankle edema or shortness of breath Trying to eat more to gain weight  EKG on today's visit shows atrial fibrillation with left bundle branch block, ventricular rate 77 bpm  Other past medical history She is currently living at [facility] She does take midodrine in the morning and evening for orthostatic hypotension   Echo 04/07/15 showed normal LV size, EF 30-35% (down from Echo on 02/28/13: EF 50-55%), hypokenesis of the anteroseptal myocardium, MV moderate regurg, mildly dilated left atrium and right ventricle, moderate to severe TR, and PA peak pressure 57 mmHg.     Allergies  Allergen Reactions  .  Sulfonamide Derivatives Nausea And Vomiting    REACTION: sick  . Alendronate Sodium Other (See Comments)    REACTION: GI    Current Outpatient Prescriptions on File Prior to Visit  Medication Sig Dispense Refill  . acetaminophen (TYLENOL) 325 MG tablet Take 650 mg by mouth every 6 (six) hours as needed.    . calcium carbonate (OS-CAL) 600 MG TABS tablet Take 1,200 mg by mouth at bedtime.    . Cyanocobalamin 1000 MCG/ML KIT Inject 1 mL as directed every 8 (eight) weeks. 1 kit 0  . ferrous sulfate 325 (65 FE) MG tablet Take 325 mg by mouth daily. With lunch    . fluticasone (FLONASE) 50 MCG/ACT nasal spray Place 2 sprays into both nostrils daily. (Patient taking differently: Place 2 sprays into both nostrils as needed. ) 16 g 11  . furosemide (LASIX) 20 MG tablet Take 1 tablet (20 mg total) by mouth daily as needed. 90 tablet 3  . Glucosamine HCl 500 MG TABS Take 1 tablet by mouth daily.    [name] levothyroxine (SYNTHROID, LEVOTHROID) 88 MCG tablet Take 1 tablet (88 mcg total) by mouth daily. 90 tablet 1  . lisinopril (PRINIVIL,ZESTRIL) 2.5 MG tablet TAKE ONE TABLET AT BEDTIME (8PM) 90 tablet 3  . Lutein 6 MG CAPS Take 1 capsule by mouth daily.     . metoprolol tartrate (LOPRESSOR) 25 MG tablet Take 1 tablet (25 mg total) by mouth 2 (two) times daily. 180 tablet 3  . midodrine (PROAMATINE) 5 MG tablet TAKE ONE TABLET BY MOUTH TWICE DAILY WITH A MEAL 180 tablet 3  . mirtazapine (REMERON) 15 MG tablet Take 1 tablet (15 mg total) by mouth at bedtime. 90 tablet 3  .  Multiple Vitamin (MULTIVITAMIN) tablet Take 1 tablet by mouth daily.     . pantoprazole (PROTONIX) 40 MG tablet Take 1 tablet (40 mg total) by mouth daily. 90 tablet 1  . potassium chloride (K-DUR,KLOR-CON) 10 MEQ tablet Take 1 tablet (10 mEq total) by mouth daily. 90 tablet 1  . senna-docusate (SENOKOT-S) 8.6-50 MG per tablet Take 1 tablet by mouth at bedtime.    . simvastatin (ZOCOR) 20 MG tablet TAKE 1/2 TABLET  (10MG  TOTAL) EVERY  EVENING 45 tablet 1  . vitamin B-12 (CYANOCOBALAMIN) 1000 MCG tablet Take 1 tablet (1,000 mcg total) by mouth daily. 30 tablet 0  . warfarin (COUMADIN) 5 MG tablet Take as directed by anti-coagulation clinic. 120 tablet 0   No current facility-administered medications on file prior to visit.    Past Medical History  Diagnosis Date  . Chronic atrial fibrillation (HCC)     a. on Coumadin (CHA2DS2VASc = 6); b. Holter 06/2010, no brady, mild tachy.  . CAD (coronary artery disease)     a. 2 DES, [facility], 2004; b. nuc 2013 septal dyssenergy 2/2 LBBB, nl LV fxn;  c. 06/2015 MV: EF 41%, septal HK (LBBB), no ischemia->Low risk.  . Pleural effusion associated with pulmonary infection 03-2007  . Hypothyroid   . Hyperlipidemia   . GERD (gastroesophageal reflux disease)   . Diverticulosis of colon   . Allergy   . Anemia   . Arthritis     osteo  . Hypertension   . COPD (chronic obstructive pulmonary disease) ([place])   . Mitral regurgitation   . Vitamin B deficiency   . Compression fracture of lumbar vertebra ([place]) 05-2008  . Osteoporosis   . Thrombocytopenia ([place])   . OA (osteoarthritis)   . LBBB (left bundle branch block)   . Hx of colonoscopy   . Warfarin anticoagulation   . TR (tricuspid regurgitation)     moderate, echo 2008  . Dyslipidemia   . Lung abnormality     Dr [name] 122/2011 no further w/u needed  . Prolonged QT interval     when on sotolol in past  . Essential hypertension   . Weight loss     August, 2012  . Nausea     Some nausea with medications, May, 2013,  . Orthostatic hypotension     June, 2014  . Shingles   . Ischemic cardiomyopathy     a. 03/2015 Echo: EF 30-35%, ant, antsept HK, mod MR, mildly dil LA/RV, mod dil RA, mod-sev TR, PASP 32mHg.  .Marland KitchenHematemesis     a. 03/2015.  . Pulmonary hypertension ([place]     a. 03/2015 PASP 566mg by echo.    Past Surgical History  Procedure Laterality Date  . Thyroidectomy  1962  . Tonsillectomy      Social History   reports that she quit smoking about 68 years ago. She has never used smokeless tobacco. She reports that she does not drink alcohol or use illicit drugs.  Family History family history includes Cancer in her brother; Diabetes in her brother; Heart disease in her sister; Heart disease (age of onset: 7670in her mother; Leukemia in her sister; Stroke (age of onset: 8252in her father.   Review of Systems  Eyes: Negative.   Respiratory: Negative.   Cardiovascular: Negative.   Gastrointestinal: Negative.   Endocrine: Negative.   Musculoskeletal: Positive for back pain and gait problem.  Skin: Negative.   Allergic/Immunologic: Negative.   Neurological: Positive for weakness.  Hematological: Negative.  Psychiatric/Behavioral: Negative.   All other systems reviewed and are negative.   BP 114/60 mmHg  Pulse 77  Ht _0  (1.702 m)  Wt 122 lb (55.339 kg)  BMI 19.10 kg/m2   Physical Exam  Constitutional: She is oriented to person, place, and time. She appears well-developed and well-nourished.  Thin frail woman in a wheelchair  HENT:  Head: Normocephalic.  Nose: Nose normal.  Mouth/Throat: Oropharynx is clear and moist.  Eyes: Conjunctivae are normal. Pupils are equal, round, and reactive to light.  Neck: Normal range of motion. Neck supple. No JVD present.  Cardiovascular: Normal rate and intact distal pulses.  An irregularly irregular rhythm present. Exam reveals no gallop and no friction rub.   Murmur heard.  Systolic murmur is present with a grade of 2/6  Pulmonary/Chest: Effort normal and breath sounds normal. No respiratory distress. She has no wheezes. She has no rales. She exhibits no tenderness.  Abdominal: Soft. Bowel sounds are normal. She exhibits no distension. There is no tenderness.  Musculoskeletal: Normal range of motion. She exhibits no edema or tenderness.  Lymphadenopathy:    She has no cervical adenopathy.  Neurological: She is alert and oriented to person,  place, and time. Coordination normal.  Skin: Skin is warm and dry. No rash noted. No erythema.  Psychiatric: She has a normal mood and affect. Her behavior is normal. Judgment and thought content normal.

## 2016-01-27 NOTE — Assessment & Plan Note (Signed)
Heart rate well controlled, tolerating anticoagulation Recommended she continue on her current medications

## 2016-01-27 NOTE — Patient Instructions (Addendum)
You are doing well. No medication changes were made.  We will schedule an echo ASAP for cardiomyopathy, preop Echocardiography is a painless test that uses sound waves to create images of your heart. It provides your doctor with information about the size and shape of your heart and how well your heart's chambers and valves are working. This procedure takes approximately one hour. There are no restrictions for this procedure.  Date & time: ________________________  Please call us if you have new issues that need to be addressed before your next appt.  Your physician wants you to follow-up in: 3 months.  You will receive a reminder letter in the mail two months in advance. If you don't receive a letter, please call our office to schedule the follow-up appointment.  Date & time: ____________________________  Echocardiogram An echocardiogram, or echocardiography, uses sound waves (ultrasound) to produce an image of your heart. The echocardiogram is simple, painless, obtained within a short period of time, and offers valuable information to your health care provider. The images from an echocardiogram can provide information such as:  Evidence of coronary artery disease (CAD).  Heart size.  Heart muscle function.  Heart valve function.  Aneurysm detection.  Evidence of a past heart attack.  Fluid buildup around the heart.  Heart muscle thickening.  Assess heart valve function. LET [facility] CARE PROVIDER KNOW ABOUT:  Any allergies you have.  All medicines you are taking, including vitamins, herbs, eye drops, creams, and over-the-counter medicines.  Previous problems you or members of your family have had with the use of anesthetics.  Any blood disorders you have.  Previous surgeries you have had.  Medical conditions you have.  Possibility of pregnancy, if this applies. BEFORE THE PROCEDURE  No special preparation is needed. Eat and drink normally.  PROCEDURE   In  order to produce an image of your heart, gel will be applied to your chest and a wand-like tool (transducer) will be moved over your chest. The gel will help transmit the sound waves from the transducer. The sound waves will harmlessly bounce off your heart to allow the heart images to be captured in real-time motion. These images will then be recorded.  You may need an IV to receive a medicine that improves the quality of the pictures. AFTER THE PROCEDURE You may return to your normal schedule including diet, activities, and medicines, unless your health care provider tells you otherwise.   This information is not intended to replace advice given to you by your health care provider. Make sure you discuss any questions you have with your health care provider.   Document Released: 08/27/2000 Document Revised: 09/20/2014 Document Reviewed: 05/07/2013 Elsevier Interactive Patient Education [employer].

## 2016-01-27 NOTE — Assessment & Plan Note (Signed)
Spinal stenosis seen on recent MRI, seen by Dr. Joya Salm. She is interested in back surgery given her severe pain Repeat echocardiogram pending. If ejection fraction improves, she will be acceptable risk for surgery.  Long discussion with patient's daughter concerning risk and benefit. We would need to come off warfarin in preparation for surgery

## 2016-02-02 ENCOUNTER — Ambulatory Visit (INDEPENDENT_AMBULATORY_CARE_PROVIDER_SITE_OTHER): Payer: PPO

## 2016-02-02 ENCOUNTER — Other Ambulatory Visit: Payer: Self-pay

## 2016-02-02 ENCOUNTER — Encounter: Payer: Self-pay | Admitting: *Deleted

## 2016-02-02 DIAGNOSIS — I482 Chronic atrial fibrillation, unspecified: Secondary | ICD-10-CM

## 2016-02-02 DIAGNOSIS — I422 Other hypertrophic cardiomyopathy: Secondary | ICD-10-CM | POA: Diagnosis not present

## 2016-02-06 ENCOUNTER — Telehealth: Payer: Self-pay | Admitting: Cardiovascular Disease

## 2016-02-06 NOTE — Telephone Encounter (Signed)
Dr. Donivan Scull last office note (w/ clearance) routed to Dr. Joya Salm.

## 2016-02-06 NOTE — Telephone Encounter (Signed)
Patient daughter is waiting on clearance from [place] to schedule Procedure with Dr. Joya Salm .  Please call daughter to let her know if and when this is done and sent to Dr. Joya Salm .

## 2016-02-11 DIAGNOSIS — M4806 Spinal stenosis, lumbar region: Secondary | ICD-10-CM | POA: Diagnosis not present

## 2016-02-12 ENCOUNTER — Telehealth: Payer: Self-pay | Admitting: Family Medicine

## 2016-02-12 ENCOUNTER — Ambulatory Visit (INDEPENDENT_AMBULATORY_CARE_PROVIDER_SITE_OTHER): Payer: PPO | Admitting: *Deleted

## 2016-02-12 ENCOUNTER — Telehealth: Payer: Self-pay | Admitting: Cardiovascular Disease

## 2016-02-12 ENCOUNTER — Other Ambulatory Visit: Payer: Self-pay | Admitting: Neurosurgery

## 2016-02-12 DIAGNOSIS — Z7901 Long term (current) use of anticoagulants: Secondary | ICD-10-CM | POA: Diagnosis not present

## 2016-02-12 DIAGNOSIS — I482 Chronic atrial fibrillation, unspecified: Secondary | ICD-10-CM

## 2016-02-12 LAB — POCT INR: INR: 1.9

## 2016-02-12 NOTE — Telephone Encounter (Signed)
[facility] manages pt's coumadin. Are you agreeable to lovenox bridge?

## 2016-02-12 NOTE — Telephone Encounter (Signed)
Yes, needs Lovenox bridge Would check with [facility] and see if they would like to do bridge, otherwise we can take care of it

## 2016-02-12 NOTE — Telephone Encounter (Signed)
Pt daughter calling stating they were at Dr Harley Hallmark office to call us and see if patient can do Lovenox Bridge.  Pt is having procedure Laminectomy on 02/22/16  Was told to stop coumadin 5 days before.  Please advise.

## 2016-02-12 NOTE — Telephone Encounter (Signed)
Thanks for the update

## 2016-02-12 NOTE — Telephone Encounter (Signed)
FYI: Pt daughter [name] called and left vm stating that pt saw Dr. Joya Salm [facility]) yesterday is schedule to have surgery March 23, 2016. [name] just wanted to keep all informed who helped with her mom. Dr. Rockey Situ did her surgical clearance.  [name] does not need call back unless you have further questions.

## 2016-02-12 NOTE — Progress Notes (Signed)
Pre visit review using our clinic review tool, if applicable. No additional management support is needed unless otherwise documented below in the visit note. 

## 2016-02-12 NOTE — Telephone Encounter (Signed)
I appreciate the update as well.

## 2016-02-16 NOTE — Telephone Encounter (Signed)
Per current guidelines she needs a Lovenox bridge. CHADS2VASc of 6 (CHF, HTN, age x 2, vascular disease, sex category). Her options are to come to the office and be instructed on Lovenox injeciton, then maybe she will feel better about it, come to the office for each injection, or give at home as previously instructed.

## 2016-02-16 NOTE — Telephone Encounter (Signed)
Dr. Rockey Situ is out of the office this week. Ryan, do you have any recommendations?

## 2016-02-16 NOTE — Telephone Encounter (Signed)
Spoke to patient' daughter.  She is not comfortable giving Lovenox injections in the home.  She does not feel like traveling to the office daily for injections after surgery will be feasible.  Are there any other options?  Home health?  Please advise?

## 2016-02-17 NOTE — Telephone Encounter (Signed)
Spoke to daughter and advised her as instructed.  She agrees to come in for teaching so that she can give injections at home.  Surgery date is 7/11 - patient will take last dose of Coumadin on 7/6 and begin injections on 7/8.  Daughter will come to our clinic on 7/7 for teaching.  Injections should be held the day of surgery, then continued on 7/12 with daily Coumadin until INR is at goal - Lovenox can be discontinued at that time.  INR should be checked on 7/17.    Daughter stated she is requesting patient be discharged from the hospital to [facility] for rehab following her surgery.  If this is the case, they will administer medications there and call us for dosing instructions pending INR check.  Daughter verbalizes understanding.  To PCP and cardiology as FYI.

## 2016-02-25 ENCOUNTER — Telehealth: Payer: Self-pay | Admitting: Family Medicine

## 2016-02-25 NOTE — Telephone Encounter (Signed)
Pt's mother notified of Dr. Marliss Coots comments. Copy of STOP form placed at front for daughter to pick up

## 2016-02-25 NOTE — Telephone Encounter (Signed)
[name] account planner) checking on a DNR for [name] Pt is having surgery 7/11 do they get the dnr here or can they get this at hospital

## 2016-02-25 NOTE — Telephone Encounter (Signed)
Make sure to take a copy of her advance directive to the hospital (living will and POA) - they will need to make sure it is on file and she has DNR order for that hospitalization  I also filled out a STOP form for them to keep at home in an open area in case of emergencies-it is in the IN box

## 2016-02-26 NOTE — Telephone Encounter (Signed)
[name] picked up DNR  She wanted to know if she could get another dnr one for ms [place] house and one for [employer] house Please mail to  [place], [place] [address]

## 2016-02-26 NOTE — Telephone Encounter (Signed)
I won't get to it until Monday Send this message back to me on Monday am please

## 2016-03-02 NOTE — Telephone Encounter (Signed)
Done and in IN box 

## 2016-03-02 NOTE — Telephone Encounter (Signed)
Form mailed to pt to address [name] requested we mailed it to in prev message

## 2016-03-11 ENCOUNTER — Other Ambulatory Visit (INDEPENDENT_AMBULATORY_CARE_PROVIDER_SITE_OTHER): Payer: PPO

## 2016-03-11 DIAGNOSIS — I482 Chronic atrial fibrillation, unspecified: Secondary | ICD-10-CM

## 2016-03-11 DIAGNOSIS — Z7901 Long term (current) use of anticoagulants: Secondary | ICD-10-CM | POA: Diagnosis not present

## 2016-03-11 LAB — POCT INR: INR: 2.1

## 2016-03-15 ENCOUNTER — Encounter (HOSPITAL_COMMUNITY)
Admission: RE | Admit: 2016-03-15 | Discharge: 2016-03-15 | Disposition: A | Discharge status: Home / Self Care | Payer: PPO | Source: Ambulatory Visit | Attending: Neurosurgery | Admitting: Neurosurgery

## 2016-03-15 ENCOUNTER — Encounter (HOSPITAL_COMMUNITY): Payer: Self-pay

## 2016-03-15 ENCOUNTER — Telehealth: Payer: Self-pay | Admitting: Family Medicine

## 2016-03-15 DIAGNOSIS — I10 Essential (primary) hypertension: Secondary | ICD-10-CM | POA: Insufficient documentation

## 2016-03-15 DIAGNOSIS — Z01818 Encounter for other preprocedural examination: Secondary | ICD-10-CM | POA: Insufficient documentation

## 2016-03-15 DIAGNOSIS — Z79899 Other long term (current) drug therapy: Secondary | ICD-10-CM | POA: Diagnosis not present

## 2016-03-15 DIAGNOSIS — Z87891 Personal history of nicotine dependence: Secondary | ICD-10-CM | POA: Insufficient documentation

## 2016-03-15 DIAGNOSIS — Z7901 Long term (current) use of anticoagulants: Secondary | ICD-10-CM | POA: Insufficient documentation

## 2016-03-15 DIAGNOSIS — Z955 Presence of coronary angioplasty implant and graft: Secondary | ICD-10-CM | POA: Diagnosis not present

## 2016-03-15 DIAGNOSIS — J449 Chronic obstructive pulmonary disease, unspecified: Secondary | ICD-10-CM | POA: Diagnosis not present

## 2016-03-15 DIAGNOSIS — K219 Gastro-esophageal reflux disease without esophagitis: Secondary | ICD-10-CM | POA: Diagnosis not present

## 2016-03-15 DIAGNOSIS — I482 Chronic atrial fibrillation: Secondary | ICD-10-CM | POA: Diagnosis not present

## 2016-03-15 DIAGNOSIS — I255 Ischemic cardiomyopathy: Secondary | ICD-10-CM | POA: Diagnosis not present

## 2016-03-15 DIAGNOSIS — I272 Other secondary pulmonary hypertension: Secondary | ICD-10-CM | POA: Diagnosis not present

## 2016-03-15 DIAGNOSIS — M4806 Spinal stenosis, lumbar region: Secondary | ICD-10-CM | POA: Diagnosis not present

## 2016-03-15 DIAGNOSIS — I252 Old myocardial infarction: Secondary | ICD-10-CM | POA: Insufficient documentation

## 2016-03-15 DIAGNOSIS — E785 Hyperlipidemia, unspecified: Secondary | ICD-10-CM | POA: Insufficient documentation

## 2016-03-15 DIAGNOSIS — I2510 Atherosclerotic heart disease of native coronary artery without angina pectoris: Secondary | ICD-10-CM | POA: Diagnosis not present

## 2016-03-15 DIAGNOSIS — E039 Hypothyroidism, unspecified: Secondary | ICD-10-CM | POA: Diagnosis not present

## 2016-03-15 DIAGNOSIS — Z01812 Encounter for preprocedural laboratory examination: Secondary | ICD-10-CM | POA: Diagnosis not present

## 2016-03-15 HISTORY — DX: Frequency of micturition: R35.0

## 2016-03-15 HISTORY — DX: Acute myocardial infarction, unspecified: I21.9

## 2016-03-15 HISTORY — DX: Nocturia: R35.1

## 2016-03-15 HISTORY — DX: Unspecified hearing loss, unspecified ear: H91.90

## 2016-03-15 HISTORY — DX: Presence of spectacles and contact lenses: Z97.3

## 2016-03-15 HISTORY — DX: Family history of other specified conditions: Z84.89

## 2016-03-15 HISTORY — DX: Presence of dental prosthetic device (complete) (partial): Z97.2

## 2016-03-15 LAB — BASIC METABOLIC PANEL
ANION GAP: 8 (ref 5–15)
BUN: 19 mg/dL (ref 6–20)
CHLORIDE: 108 mmol/L (ref 101–111)
CO2: 24 mmol/L (ref 22–32)
Calcium: 9.5 mg/dL (ref 8.9–10.3)
Creatinine, Ser: 1.22 mg/dL — ABNORMAL HIGH (ref 0.44–1.00)
GFR, EST AFRICAN AMERICAN: 43 mL/min — AB (ref >60)
GFR, EST NON AFRICAN AMERICAN: 37 mL/min — AB (ref >60)
Glucose, Bld: 92 mg/dL (ref 65–99)
POTASSIUM: 5.5 mmol/L — AB (ref 3.5–5.1)
SODIUM: 140 mmol/L (ref 135–145)

## 2016-03-15 LAB — CBC
HCT: 38.3 % (ref 36.0–46.0)
HEMOGLOBIN: 11.9 g/dL — AB (ref 12.0–15.0)
MCH: 31.2 pg (ref 26.0–34.0)
MCHC: 31.1 g/dL (ref 30.0–36.0)
MCV: 100.3 fL — ABNORMAL HIGH (ref 78.0–100.0)
PLATELETS: 122 10*3/uL — AB (ref 150–400)
RBC: 3.82 MIL/uL — AB (ref 3.87–5.11)
RDW: 12.7 % (ref 11.5–15.5)
WBC: 6.2 10*3/uL (ref 4.0–10.5)

## 2016-03-15 LAB — SURGICAL PCR SCREEN
MRSA, PCR: NEGATIVE
STAPHYLOCOCCUS AUREUS: NEGATIVE

## 2016-03-15 MED ORDER — ENOXAPARIN SODIUM 60 MG/0.6ML ~~LOC~~ SOLN: 1.0000 mg/kg | Freq: Two times a day (BID) | SUBCUTANEOUS | Status: DC

## 2016-03-15 NOTE — Pre-Procedure Instructions (Signed)
YICEL SHERLIN  03/15/2016      [facility] - [place], [place] - [address] [place] [address] Phone: [phone] Fax: [phone]  [facility] [place], [place] - [address] [place] [address] Phone: [phone] Fax: [phone]  [id number] [address], [place] [address] Phone: [phone] Fax: [phone]    Your procedure is scheduled on Tuesday, July 11th, 2017.  Report to [facility] Admitting at 10:30 A.M.   Call this number if you have problems the morning of surgery:  [phone]   Remember:  Do not eat food or drink liquids after midnight.   Take these medicines the morning of surgery with A SIP OF WATER: Flonase (if needed), Levothyroxine (Synthroid), Metoprolol Tartrate (Lopressor), Midodrine (Proamatine), Pantoprazole (Protonix).  Follow your MD's instructions for your Warfarin (Coumadin) - Last dose of Warfarin on Thursday, July 6th, 2017.    Begin Lovenox as scheduled, 03/20/16.  Stop taking: Aspirin, NSAIDS, Aleve, Naproxen, Ibuprofen, Advil, Motrin, BC's, Goody's, Fish oil, all herbal medications, and all vitamins.    Do not wear jewelry, make-up or nail polish.  Do not wear lotions, powders, or perfumes.  You may NOT wear deoderant.  Do not shave 48 hours prior to surgery.  Do not bring valuables to the hospital.   [facility] is not responsible for any belongings or valuables.  Contacts, dentures or bridgework may not be worn into surgery.  Leave your suitcase in the car.  After surgery it may be brought to your room.  For patients admitted to the hospital, discharge time will be determined by your treatment team.  Patients discharged the day of surgery will not be allowed to drive home.   Special instructions:  Preparing for Surgery.   Please read over the following fact sheets that you were given. MRSA  Information   [facility]- Preparing For Surgery  Before surgery, you can play an important role. Because skin is not sterile, your skin needs to be as free of germs as possible. You can reduce the number of germs on your skin by washing with CHG (chlorahexidine gluconate) Soap before surgery.  CHG is an antiseptic cleaner which kills germs and bonds with the skin to continue killing germs even after washing.  Please do not use if you have an allergy to CHG or antibacterial soaps. If your skin becomes reddened/irritated stop using the CHG.  Do not shave (including legs and underarms) for at least 48 hours prior to first CHG shower. It is OK to shave your face.  Please follow these instructions carefully.   1. Shower the NIGHT BEFORE SURGERY and the MORNING OF SURGERY with CHG.   2. If you chose to wash your hair, wash your hair first as usual with your normal shampoo.  3. After you shampoo, rinse your hair and body thoroughly to remove the shampoo.  4. Use CHG as you would any other liquid soap. You can apply CHG directly to the skin and wash gently with a scrungie or a clean washcloth.   5. Apply the CHG Soap to your body ONLY FROM THE NECK DOWN.  Do not use on open wounds or open sores. Avoid contact with your eyes, ears, mouth and genitals (private parts). Wash genitals (private parts) with your normal soap.  6. Wash thoroughly, paying special attention to the area where  your surgery will be performed.  7. Thoroughly rinse your body with warm water from the neck down.  8. DO NOT shower/wash with your normal soap after using and rinsing off the CHG Soap.  9. Pat yourself dry with a CLEAN TOWEL.   10. Wear CLEAN PAJAMAS   11. Place CLEAN SHEETS on your bed the night of your first shower and DO NOT SLEEP WITH PETS.  Day of Surgery: Do not apply any deodorants/lotions. Please wear clean clothes to the hospital/surgery center.

## 2016-03-15 NOTE — Telephone Encounter (Signed)
Advised lovenox sent to pharmacy. Pt's daughter would like to come in for teaching at our office or cardiology office (if we don't have someone to do lovenox teaching here). I told patient we would be in touch on Wednesday with place and time for lovenox teaching (pt prefers Friday 7/7 after 2pm, first shot would be Saturday)  Per Gina's note: Theo Dills, RN at 02/17/2016 9:58 AM     Status: Signed       Expand All Collapse All   Spoke to daughter and advised her as instructed. She agrees to come in for teaching so that she can give injections at home. Surgery date is 7/11 - patient will take last dose of Coumadin on 7/6 and begin injections on 7/8. Daughter will come to our clinic on 7/7 for teaching. Injections should be held the day of surgery, then continued on 7/12 with daily Coumadin until INR is at goal - Lovenox can be discontinued at that time. INR should be checked on 7/17.   Daughter stated she is requesting patient be discharged from the hospital to [facility] for rehab following her surgery. If this is the case, they will administer medications there and call us for dosing instructions pending INR check. Daughter verbalizes understanding. To PCP and cardiology as FYI.

## 2016-03-15 NOTE — Telephone Encounter (Signed)
I sent the px for 60 mg bid to her pharmacy- [facility] can let me know if any problems  Her daughter was supposed to come in 7/7 to be taught how to give the shot- since Barnett Applebaum is not here- is any one else comfortable doing this? Please let me know

## 2016-03-15 NOTE — Progress Notes (Signed)
PCP - Dr. Loura Pardon Cardiologist - Dr. Rockey Situ  EKG - 01/28/16 CXR - 09/29/15  Echo - 01/2016 Stress test - 2016 Cardiac Cath - 2004  Patient denies chest pain and shortness of breath at PAT appointment.    Patient currently resides at [facility] but daughter hopes that patient can be discharged to a skilled nursing facility.    Last dose of Coumadin scheduled for 03/18/16.  Lovenox bridge to be started on 03/20/16.  Patient's daughter still has questions about where to go for instructions on how to give Lovenox injections.  Patient encouraged to call clinic or Dr. Marliss Coots office.

## 2016-03-15 NOTE — Telephone Encounter (Signed)
[name] went with pt to pre op today. She wanted to know who was going to teach her to give pt shot (for someone who is on coumadin) gina was originally going to teach her She also wants to know who will  write the rx The [facility] 1st shot due 7/8-7/9-7/10 Please advise

## 2016-03-17 ENCOUNTER — Telehealth: Payer: Self-pay

## 2016-03-17 ENCOUNTER — Telehealth: Payer: Self-pay | Admitting: Cardiovascular Disease

## 2016-03-17 NOTE — Progress Notes (Signed)
Anesthesia Chart Review:  Pt is a 82 year old female scheduled for L3-4 laminectomy/ foraminotomy on 03/23/2016 with Leeroy Cha, MD.   Cardiologist is Ida Rogue, MD who has cleared pt for surgery.   PMH includes:  CAD (DES to LAD x2 in 2004), MI, ischemic cardiomyopathy, chronic atrial fibrillation, HTN, hyperlipidemia, mitral regurgitation, thrombocytopenia, LBBB, tricuspid regurgitation, anemia, pulmonary HTN, COPD, hypothyroidism, anemia, GERD. Former smoker. BMI 19.5  Medications include: lovenox, iron, lasix, levothyroxine, lisinopril, metoprolol, midodrine, protonix, potassium, simvastatin, coumadin. Last dose coumadin 03/18/16, lovenox bridge starts 03/20/16.   Preoperative labs reviewed.  PT will be obtained DOS.   Chest x-ray 09/29/15 reviewed. COPD, cardiomegaly. No active disease.  EKG 01/27/16: atrial fibrillation (77 bpm), LBBB.   Echo 02/02/16:  - Left ventricle: The cavity size was normal. Systolic function was moderately to severely reduced. The estimated ejection fraction was in the range of 30% to 35%. Hypokinesis of the anterior myocardium. Hypokinesis of the anteroseptal myocardium. Left ventricular diastolic function parameters were normal. - Aortic root: The aortic root was mildly dilated, 3.4 cm, ascending aorta mildly dilated 3.5 cm - Mitral valve: There was mild regurgitation. - Left atrium: The atrium was mildly dilated. - Right atrium: The atrium was mildly dilated. - Tricuspid valve: There was mild-moderate regurgitation. - Pulmonary arteries: PA peak pressure: 33 mm Hg (S).  Nuclear stress test 07/02/15: Pharmacological myocardial perfusion imaging study with no significant ischemia. EF estimated at 41%, septal wall hypokinesis noted, most likely secondary to conduction abnormality/left bundle branch block. Depressed ejection fraction likely from conduction abnormality. No EKG changes concerning for ischemia. Low risk scan  Cardiac cath 08/29/03 (at [facility], see  care everywhere): - Left Main: normal - LAD system: significant. Lesion #1: Dist LAD 75% (pre) to Normal (post). Lesion #2: Mid LAD 75% (pre) to Normal (post) - LCX system: not evaluated - RCA system: not visualized  If no changes, I anticipate pt can proceed with surgery as scheduled.   Willeen Cass, FNP-BC [facility] Stay [facility]/Anesthesiology Phone: [phone] 03/17/2016 2:22 PM

## 2016-03-17 NOTE — Telephone Encounter (Signed)
Terry with [facility] called to verify lovenox was to be given bid. Melodie Bouillon per Dr Marliss Coots comment Lovenox is twice daily for lovenox bridging. Coralyn Mark voiced understanding.

## 2016-03-17 NOTE — Telephone Encounter (Signed)
Anyone here can instruct how to give an injection. Lovenox is sub-q in the belly just like an insulin injection. We just can't dose her. So, [place] should be able to do this. If she can't, let me know.

## 2016-03-17 NOTE — Telephone Encounter (Signed)
Patient is having surgery on 03-23-16.  Patient daughter wants to know Lovenox directions prior to surgery.  Please call to clarify .

## 2016-03-17 NOTE — Telephone Encounter (Signed)
It is twice daily for lovenox bridging

## 2016-03-17 NOTE — Telephone Encounter (Signed)
[name] is at [facility] to pick up lovenox; After talking with Hinton Dyer was under the impression pt would get one shot a day but now at pharmacy pt instructions are to get lovenox twice a day. [name] request cb. I reviewed 03/15/16 phone note instructions that lovenox was sent with instructions bid but [name] wanted  to be verified bid instead of once daily. [name] also request cb if can get teaching to give lovenox shot on 03/19/16 after 2 pm.

## 2016-03-17 NOTE — Telephone Encounter (Signed)
Pt was notified of Dr. Marliss Coots instructions.

## 2016-03-17 NOTE — Telephone Encounter (Signed)
[facility], if that is ok with you-please let them know

## 2016-03-17 NOTE — Telephone Encounter (Signed)
Looks like Dr. Marliss Coots office is handling this.  Pt has coumadin checks in their office.

## 2016-03-18 NOTE — Telephone Encounter (Signed)
Pt's daughter will stop by the office tomorrow so I can show her how to give the lovenox inj

## 2016-03-22 MED ORDER — CEFAZOLIN SODIUM-DEXTROSE 2-4 GM/100ML-% IV SOLN
2.0000 g | INTRAVENOUS | Status: AC
  Administered 2016-03-23: 2 g via INTRAVENOUS
  Filled 2016-03-22: qty 100

## 2016-03-22 NOTE — H&P (Signed)
[name] is an 82 y.o. female.   Chief Complaint: back and leg pain with walking HPI: 82 y/o female ,independent, mentally alert who came with her daughter complaining of lumbar pain with radiation to both legs associated with walking and better with flxing her spine. No better with conservative treatment including pt. Mri shows lumbar stenosis   At l34,l45 Past Medical History  Diagnosis Date  . Chronic atrial fibrillation (HCC)     a. on Coumadin (CHA2DS2VASc = 6); b. Holter 06/2010, no brady, mild tachy.  . CAD (coronary artery disease)     a. 2 DES, [facility], 2004; b. nuc 2013 septal dyssenergy 2/2 LBBB, nl LV fxn;  c. 06/2015 MV: EF 41%, septal HK (LBBB), no ischemia->Low risk.  . Pleural effusion associated with pulmonary infection 03-2007  . Hypothyroid   . Hyperlipidemia   . GERD (gastroesophageal reflux disease)   . Diverticulosis of colon   . Allergy   . Anemia   . Arthritis     osteo  . Hypertension   . Mitral regurgitation   . Vitamin B deficiency   . Compression fracture of lumbar vertebra ([place]) 05-2008  . Osteoporosis   . Thrombocytopenia ([place])   . OA (osteoarthritis)   . LBBB (left bundle branch block)   . Hx of colonoscopy   . Warfarin anticoagulation   . TR (tricuspid regurgitation)     moderate, echo 2008  . Dyslipidemia   . Lung abnormality     Dr [name] 122/2011 no further w/u needed  . Prolonged QT interval     when on sotolol in past  . Essential hypertension   . Weight loss     August, 2012  . Nausea     Some nausea with medications, May, 2013,  . Orthostatic hypotension     June, 2014  . Shingles 2014  . Ischemic cardiomyopathy     a. 03/2015 Echo: EF 30-35%, ant, antsept HK, mod MR, mildly dil LA/RV, mod dil RA, mod-sev TR, PASP 44mmHg.  [name] Hematemesis     a. 03/2015.  . Pulmonary hypertension ([place])     a. 03/2015 PASP 43mmHg by echo.  . Family history of adverse reaction to anesthesia     daughter PONV  . COPD (chronic obstructive pulmonary  disease) ([place])   . Myocardial infarction [facility]) 2016    "mild heart attack"  . Wears dentures   . Urinary frequency   . Nocturia   . Wears glasses   . Hard of hearing     Past Surgical History  Procedure Laterality Date  . Thyroidectomy  1962  . Tonsillectomy    . Coronary angioplasty  2004  . Colonoscopy      Family History  Problem Relation Age of Onset  . Heart disease Mother 72  . Stroke Father 80  . Heart disease Sister   . Cancer Brother     colon  . Diabetes Brother   . Leukemia Sister    Social History:  reports that she quit smoking about 68 years ago. She has never used smokeless tobacco. She reports that she does not drink alcohol or use illicit drugs.  Allergies:  Allergies  Allergen Reactions  . Sulfonamide Derivatives Nausea And Vomiting    REACTION: sick  . Alendronate Sodium Other (See Comments)    REACTION: GI    No prescriptions prior to admission    No results found for this or any previous visit (from the past 48 hour(s)). No  results found.  Review of Systems  Constitutional: Negative.   Eyes: Negative.   Respiratory: Negative.   Cardiovascular: Negative.   Gastrointestinal: Positive for nausea.  Genitourinary: Positive for urgency.  Musculoskeletal: Positive for back pain.  Skin: Negative.   Neurological: Positive for sensory change and focal weakness.  Endo/Heme/Allergies: Negative.   Psychiatric/Behavioral: Negative.     There were no vitals taken for this visit. Physical Exam hent,nl. Neck, nl. Cv, nl. Lungs, clear. Abdomen, nl. Extremities nl. NEURO slr POSITIVE AT 60 DEGREES. FEMORAL STRECTH MANEUVER POSITIVE BILATERALLY. dte and sensory, wnl.    Assessment/Plan Patient is 82 but with the exception of her lumabr stenosis she is in good health. She has failed with conservative treatment including PT. SHE WANTS TO GO AHEAD WITH SURGERY WHICH WILL BE DECOMPRESSION AT L3-4,4-5 WITH POSSIBLE pla. SHE AND HER daughter are aware of  risks and benefis specially on her age range  Floyce Stakes, MD 03/22/2016, 5:12 PM

## 2016-03-23 ENCOUNTER — Ambulatory Visit (HOSPITAL_COMMUNITY): Payer: PPO

## 2016-03-23 ENCOUNTER — Encounter (HOSPITAL_COMMUNITY)
Admission: AD | Disposition: A | Discharge status: Skilled Nursing Facility | Payer: Self-pay | Source: Ambulatory Visit | Attending: Neurosurgery

## 2016-03-23 ENCOUNTER — Encounter (HOSPITAL_COMMUNITY): Payer: Self-pay | Admitting: Surgery

## 2016-03-23 ENCOUNTER — Ambulatory Visit (HOSPITAL_COMMUNITY): Payer: PPO | Admitting: Certified Registered Nurse Anesthetist

## 2016-03-23 ENCOUNTER — Inpatient Hospital Stay (HOSPITAL_COMMUNITY)
Admission: AD | Admit: 2016-03-23 | Discharge: 2016-04-02 | DRG: 460 | Disposition: A | Discharge status: Skilled Nursing Facility | Payer: PPO | Source: Ambulatory Visit | Attending: Neurosurgery | Admitting: Neurosurgery

## 2016-03-23 ENCOUNTER — Ambulatory Visit (HOSPITAL_COMMUNITY): Payer: PPO | Admitting: Vascular Surgery

## 2016-03-23 DIAGNOSIS — M5416 Radiculopathy, lumbar region: Secondary | ICD-10-CM | POA: Diagnosis not present

## 2016-03-23 DIAGNOSIS — G9761 Postprocedural hematoma of a nervous system organ or structure following a nervous system procedure: Secondary | ICD-10-CM | POA: Diagnosis not present

## 2016-03-23 DIAGNOSIS — I482 Chronic atrial fibrillation, unspecified: Secondary | ICD-10-CM | POA: Diagnosis present

## 2016-03-23 DIAGNOSIS — Z882 Allergy status to sulfonamides status: Secondary | ICD-10-CM

## 2016-03-23 DIAGNOSIS — I5022 Chronic systolic (congestive) heart failure: Secondary | ICD-10-CM | POA: Diagnosis present

## 2016-03-23 DIAGNOSIS — N390 Urinary tract infection, site not specified: Secondary | ICD-10-CM | POA: Diagnosis not present

## 2016-03-23 DIAGNOSIS — M4186 Other forms of scoliosis, lumbar region: Secondary | ICD-10-CM | POA: Diagnosis not present

## 2016-03-23 DIAGNOSIS — M4806 Spinal stenosis, lumbar region: Principal | ICD-10-CM | POA: Diagnosis present

## 2016-03-23 DIAGNOSIS — Z87891 Personal history of nicotine dependence: Secondary | ICD-10-CM | POA: Diagnosis not present

## 2016-03-23 DIAGNOSIS — D62 Acute posthemorrhagic anemia: Secondary | ICD-10-CM | POA: Diagnosis not present

## 2016-03-23 DIAGNOSIS — R0682 Tachypnea, not elsewhere classified: Secondary | ICD-10-CM

## 2016-03-23 DIAGNOSIS — J9811 Atelectasis: Secondary | ICD-10-CM | POA: Diagnosis not present

## 2016-03-23 DIAGNOSIS — R339 Retention of urine, unspecified: Secondary | ICD-10-CM | POA: Diagnosis present

## 2016-03-23 DIAGNOSIS — Z419 Encounter for procedure for purposes other than remedying health state, unspecified: Secondary | ICD-10-CM

## 2016-03-23 DIAGNOSIS — Z66 Do not resuscitate: Secondary | ICD-10-CM | POA: Diagnosis not present

## 2016-03-23 DIAGNOSIS — M48062 Spinal stenosis, lumbar region with neurogenic claudication: Secondary | ICD-10-CM | POA: Diagnosis present

## 2016-03-23 DIAGNOSIS — Z7901 Long term (current) use of anticoagulants: Secondary | ICD-10-CM

## 2016-03-23 DIAGNOSIS — I959 Hypotension, unspecified: Secondary | ICD-10-CM | POA: Diagnosis not present

## 2016-03-23 DIAGNOSIS — E875 Hyperkalemia: Secondary | ICD-10-CM | POA: Diagnosis not present

## 2016-03-23 DIAGNOSIS — I5023 Acute on chronic systolic (congestive) heart failure: Secondary | ICD-10-CM | POA: Insufficient documentation

## 2016-03-23 DIAGNOSIS — I5042 Chronic combined systolic (congestive) and diastolic (congestive) heart failure: Secondary | ICD-10-CM | POA: Diagnosis present

## 2016-03-23 DIAGNOSIS — J449 Chronic obstructive pulmonary disease, unspecified: Secondary | ICD-10-CM | POA: Diagnosis not present

## 2016-03-23 DIAGNOSIS — D696 Thrombocytopenia, unspecified: Secondary | ICD-10-CM | POA: Diagnosis not present

## 2016-03-23 DIAGNOSIS — I951 Orthostatic hypotension: Secondary | ICD-10-CM | POA: Diagnosis not present

## 2016-03-23 DIAGNOSIS — I509 Heart failure, unspecified: Secondary | ICD-10-CM

## 2016-03-23 DIAGNOSIS — K219 Gastro-esophageal reflux disease without esophagitis: Secondary | ICD-10-CM | POA: Diagnosis present

## 2016-03-23 DIAGNOSIS — N183 Chronic kidney disease, stage 3 (moderate): Secondary | ICD-10-CM | POA: Diagnosis present

## 2016-03-23 DIAGNOSIS — Z981 Arthrodesis status: Secondary | ICD-10-CM | POA: Diagnosis not present

## 2016-03-23 DIAGNOSIS — I130 Hypertensive heart and chronic kidney disease with heart failure and stage 1 through stage 4 chronic kidney disease, or unspecified chronic kidney disease: Secondary | ICD-10-CM | POA: Diagnosis not present

## 2016-03-23 DIAGNOSIS — I2510 Atherosclerotic heart disease of native coronary artery without angina pectoris: Secondary | ICD-10-CM | POA: Diagnosis present

## 2016-03-23 DIAGNOSIS — R0602 Shortness of breath: Secondary | ICD-10-CM

## 2016-03-23 DIAGNOSIS — I10 Essential (primary) hypertension: Secondary | ICD-10-CM | POA: Diagnosis not present

## 2016-03-23 DIAGNOSIS — E785 Hyperlipidemia, unspecified: Secondary | ICD-10-CM | POA: Diagnosis present

## 2016-03-23 DIAGNOSIS — I255 Ischemic cardiomyopathy: Secondary | ICD-10-CM | POA: Diagnosis present

## 2016-03-23 DIAGNOSIS — Y838 Other surgical procedures as the cause of abnormal reaction of the patient, or of later complication, without mention of misadventure at the time of the procedure: Secondary | ICD-10-CM | POA: Diagnosis not present

## 2016-03-23 DIAGNOSIS — I447 Left bundle-branch block, unspecified: Secondary | ICD-10-CM | POA: Diagnosis present

## 2016-03-23 DIAGNOSIS — E039 Hypothyroidism, unspecified: Secondary | ICD-10-CM | POA: Diagnosis not present

## 2016-03-23 DIAGNOSIS — Z888 Allergy status to other drugs, medicaments and biological substances status: Secondary | ICD-10-CM

## 2016-03-23 DIAGNOSIS — Z8249 Family history of ischemic heart disease and other diseases of the circulatory system: Secondary | ICD-10-CM

## 2016-03-23 DIAGNOSIS — H9190 Unspecified hearing loss, unspecified ear: Secondary | ICD-10-CM | POA: Diagnosis present

## 2016-03-23 DIAGNOSIS — Z79899 Other long term (current) drug therapy: Secondary | ICD-10-CM

## 2016-03-23 HISTORY — PX: LUMBAR LAMINECTOMY/DECOMPRESSION MICRODISCECTOMY: SHX5026

## 2016-03-23 LAB — PROTIME-INR
INR: 1.15 (ref 0.00–1.49)
PROTHROMBIN TIME: 14.9 s (ref 11.6–15.2)

## 2016-03-23 LAB — ABO/RH: ABO/RH(D): A NEG

## 2016-03-23 SURGERY — LUMBAR LAMINECTOMY/DECOMPRESSION MICRODISCECTOMY 1 LEVEL
Anesthesia: General

## 2016-03-23 MED ORDER — ACETAMINOPHEN 325 MG PO TABS
650.0000 mg | ORAL_TABLET | ORAL | Status: DC | PRN
  Administered 2016-03-26 – 2016-04-02 (×14): 650 mg via ORAL
  Filled 2016-03-23 (×14): qty 2

## 2016-03-23 MED ORDER — SUGAMMADEX SODIUM 200 MG/2ML IV SOLN
INTRAVENOUS | Status: DC | PRN
  Administered 2016-03-23: 120 mg via INTRAVENOUS

## 2016-03-23 MED ORDER — FENTANYL CITRATE (PF) 250 MCG/5ML IJ SOLN
INTRAMUSCULAR | Status: DC | PRN
  Administered 2016-03-23 (×7): 25 ug via INTRAVENOUS

## 2016-03-23 MED ORDER — GELATIN ABSORBABLE MT POWD
OROMUCOSAL | Status: DC | PRN
  Administered 2016-03-23: 15:00:00 via TOPICAL

## 2016-03-23 MED ORDER — PHENOL 1.4 % MT LIQD: 1.0000 | OROMUCOSAL | Status: DC | PRN

## 2016-03-23 MED ORDER — ACETAMINOPHEN 650 MG RE SUPP
650.0000 mg | RECTAL | Status: DC | PRN
  Administered 2016-03-30: 650 mg via RECTAL
  Filled 2016-03-23: qty 1

## 2016-03-23 MED ORDER — OXYCODONE-ACETAMINOPHEN 5-325 MG PO TABS
1.0000 | ORAL_TABLET | ORAL | Status: DC | PRN
  Administered 2016-03-23: 1 via ORAL
  Administered 2016-03-23: 2 via ORAL
  Administered 2016-03-24: 1 via ORAL
  Administered 2016-03-25: 2 via ORAL
  Administered 2016-03-25 – 2016-03-27 (×6): 1 via ORAL
  Filled 2016-03-23 (×2): qty 1
  Filled 2016-03-23 (×2): qty 2
  Filled 2016-03-23 (×5): qty 1

## 2016-03-23 MED ORDER — SODIUM CHLORIDE 0.9% FLUSH
3.0000 mL | Freq: Two times a day (BID) | INTRAVENOUS | Status: DC
  Administered 2016-03-23 – 2016-04-02 (×16): 3 mL via INTRAVENOUS

## 2016-03-23 MED ORDER — CEFAZOLIN SODIUM-DEXTROSE 2-4 GM/100ML-% IV SOLN
INTRAVENOUS | Status: AC
  Filled 2016-03-23: qty 100

## 2016-03-23 MED ORDER — ONDANSETRON HCL 4 MG/2ML IJ SOLN
INTRAMUSCULAR | Status: DC | PRN
  Administered 2016-03-23: 4 mg via INTRAVENOUS

## 2016-03-23 MED ORDER — ARTIFICIAL TEARS OP OINT
TOPICAL_OINTMENT | OPHTHALMIC | Status: AC
  Filled 2016-03-23: qty 3.5

## 2016-03-23 MED ORDER — SUGAMMADEX SODIUM 200 MG/2ML IV SOLN
INTRAVENOUS | Status: AC
  Filled 2016-03-23: qty 2

## 2016-03-23 MED ORDER — SODIUM CHLORIDE 0.9 % IV SOLN
INTRAVENOUS | Status: DC
  Administered 2016-03-23: 20:00:00 via INTRAVENOUS

## 2016-03-23 MED ORDER — WHITE PETROLATUM GEL
Status: DC | PRN
  Administered 2016-03-23: 0.2 via TOPICAL
  Filled 2016-03-23: qty 1

## 2016-03-23 MED ORDER — PHENYLEPHRINE HCL 10 MG/ML IJ SOLN
10.0000 mg | INTRAVENOUS | Status: DC | PRN
  Administered 2016-03-23: 20 ug/min via INTRAVENOUS

## 2016-03-23 MED ORDER — ROCURONIUM BROMIDE 100 MG/10ML IV SOLN
INTRAVENOUS | Status: DC | PRN
  Administered 2016-03-23: 10 mg via INTRAVENOUS
  Administered 2016-03-23: 20 mg via INTRAVENOUS

## 2016-03-23 MED ORDER — LACTATED RINGERS IV SOLN
INTRAVENOUS | Status: DC
  Administered 2016-03-23 (×3): via INTRAVENOUS

## 2016-03-23 MED ORDER — DIAZEPAM 5 MG PO TABS
5.0000 mg | ORAL_TABLET | Freq: Four times a day (QID) | ORAL | Status: DC | PRN
  Administered 2016-03-23 – 2016-03-24 (×2): 5 mg via ORAL
  Filled 2016-03-23 (×2): qty 1

## 2016-03-23 MED ORDER — CEFAZOLIN IN D5W 1 GM/50ML IV SOLN
1.0000 g | Freq: Two times a day (BID) | INTRAVENOUS | Status: AC
  Administered 2016-03-24 (×2): 1 g via INTRAVENOUS
  Filled 2016-03-23 (×2): qty 50

## 2016-03-23 MED ORDER — PROPOFOL 10 MG/ML IV BOLUS
INTRAVENOUS | Status: AC
  Filled 2016-03-23: qty 20

## 2016-03-23 MED ORDER — 0.9 % SODIUM CHLORIDE (POUR BTL) OPTIME
TOPICAL | Status: DC | PRN
  Administered 2016-03-23: 1000 mL

## 2016-03-23 MED ORDER — KETOROLAC TROMETHAMINE 30 MG/ML IJ SOLN
INTRAMUSCULAR | Status: AC
  Filled 2016-03-23: qty 1

## 2016-03-23 MED ORDER — HEMOSTATIC AGENTS (NO CHARGE) OPTIME
TOPICAL | Status: DC | PRN
  Administered 2016-03-23: 1 via TOPICAL

## 2016-03-23 MED ORDER — FENTANYL CITRATE (PF) 100 MCG/2ML IJ SOLN
INTRAMUSCULAR | Status: AC
  Filled 2016-03-23: qty 2

## 2016-03-23 MED ORDER — OXYCODONE-ACETAMINOPHEN 5-325 MG PO TABS
ORAL_TABLET | ORAL | Status: AC
  Filled 2016-03-23: qty 1

## 2016-03-23 MED ORDER — THROMBIN 5000 UNITS EX SOLR
CUTANEOUS | Status: DC | PRN
  Administered 2016-03-23 (×2): 5000 [IU] via TOPICAL

## 2016-03-23 MED ORDER — PROPOFOL 10 MG/ML IV BOLUS
INTRAVENOUS | Status: DC | PRN
  Administered 2016-03-23: 50 mg via INTRAVENOUS

## 2016-03-23 MED ORDER — SENNA 8.6 MG PO TABS
1.0000 | ORAL_TABLET | Freq: Two times a day (BID) | ORAL | Status: DC
  Administered 2016-03-23 – 2016-04-02 (×18): 8.6 mg via ORAL
  Filled 2016-03-23 (×19): qty 1

## 2016-03-23 MED ORDER — SODIUM CHLORIDE 0.9% FLUSH: 3.0000 mL | INTRAVENOUS | Status: DC | PRN

## 2016-03-23 MED ORDER — SODIUM CHLORIDE 0.9 % IV SOLN: 250.0000 mL | INTRAVENOUS | Status: DC

## 2016-03-23 MED ORDER — LIDOCAINE 2% (20 MG/ML) 5 ML SYRINGE
INTRAMUSCULAR | Status: DC | PRN
  Administered 2016-03-23: 60 mg via INTRAVENOUS

## 2016-03-23 MED ORDER — ESMOLOL HCL 100 MG/10ML IV SOLN
INTRAVENOUS | Status: DC | PRN
  Administered 2016-03-23: 5 mg via INTRAVENOUS

## 2016-03-23 MED ORDER — FENTANYL CITRATE (PF) 250 MCG/5ML IJ SOLN
INTRAMUSCULAR | Status: AC
  Filled 2016-03-23: qty 5

## 2016-03-23 MED ORDER — MENTHOL 3 MG MT LOZG: 1.0000 | LOZENGE | OROMUCOSAL | Status: DC | PRN

## 2016-03-23 MED ORDER — LIDOCAINE 2% (20 MG/ML) 5 ML SYRINGE
INTRAMUSCULAR | Status: AC
  Filled 2016-03-23: qty 5

## 2016-03-23 MED ORDER — FENTANYL CITRATE (PF) 100 MCG/2ML IJ SOLN
25.0000 ug | INTRAMUSCULAR | Status: DC | PRN
  Administered 2016-03-23 (×2): 50 ug via INTRAVENOUS
  Administered 2016-03-23 (×2): 25 ug via INTRAVENOUS

## 2016-03-23 MED ORDER — BACITRACIN ZINC 500 UNIT/GM EX OINT
TOPICAL_OINTMENT | CUTANEOUS | Status: DC | PRN
  Administered 2016-03-23: 1 via TOPICAL

## 2016-03-23 SURGICAL SUPPLY — 53 items
BENZOIN TINCTURE PRP APPL 2/3 (GAUZE/BANDAGES/DRESSINGS) ×2 IMPLANT
BLADE CLIPPER SURG (BLADE) IMPLANT
BUR ACORN 6.0 (BURR) ×2 IMPLANT
BUR MATCHSTICK NEURO 3.0 LAGG (BURR) ×2 IMPLANT
CANISTER SUCT 3000ML PPV (MISCELLANEOUS) ×2 IMPLANT
DRAPE LAPAROTOMY 100X72X124 (DRAPES) ×2 IMPLANT
DRAPE MICROSCOPE LEICA (MISCELLANEOUS) ×2 IMPLANT
DRAPE POUCH INSTRU U-SHP 10X18 (DRAPES) ×2 IMPLANT
DRAPE PROXIMA HALF (DRAPES) ×2 IMPLANT
DRSG OPSITE 4X5.5 SM (GAUZE/BANDAGES/DRESSINGS) ×2 IMPLANT
DRSG OPSITE POSTOP 4X6 (GAUZE/BANDAGES/DRESSINGS) ×2 IMPLANT
DRSG PAD ABDOMINAL 8X10 ST (GAUZE/BANDAGES/DRESSINGS) IMPLANT
DURAPREP 26ML APPLICATOR (WOUND CARE) ×2 IMPLANT
ELECT REM PT RETURN 9FT ADLT (ELECTROSURGICAL) ×2
ELECTRODE REM PT RTRN 9FT ADLT (ELECTROSURGICAL) ×1 IMPLANT
EVACUATOR 1/8 PVC DRAIN (DRAIN) ×2 IMPLANT
GAUZE SPONGE 4X4 12PLY STRL (GAUZE/BANDAGES/DRESSINGS) ×2 IMPLANT
GAUZE SPONGE 4X4 16PLY XRAY LF (GAUZE/BANDAGES/DRESSINGS) ×2 IMPLANT
GLOVE BIOGEL M 8.0 STRL (GLOVE) ×2 IMPLANT
GLOVE EXAM NITRILE LRG STRL (GLOVE) IMPLANT
GLOVE EXAM NITRILE MD LF STRL (GLOVE) IMPLANT
GLOVE EXAM NITRILE XL STR (GLOVE) IMPLANT
GLOVE EXAM NITRILE XS STR PU (GLOVE) IMPLANT
GOWN STRL REUS W/ TWL LRG LVL3 (GOWN DISPOSABLE) ×1 IMPLANT
GOWN STRL REUS W/ TWL XL LVL3 (GOWN DISPOSABLE) IMPLANT
GOWN STRL REUS W/TWL 2XL LVL3 (GOWN DISPOSABLE) IMPLANT
GOWN STRL REUS W/TWL LRG LVL3 (GOWN DISPOSABLE) ×1
GOWN STRL REUS W/TWL XL LVL3 (GOWN DISPOSABLE)
HEMOSTAT POWDER KIT SURGIFOAM (HEMOSTASIS) ×2 IMPLANT
KIT BASIN OR (CUSTOM PROCEDURE TRAY) ×2 IMPLANT
KIT ROOM TURNOVER OR (KITS) ×2 IMPLANT
NEEDLE HYPO 18GX1.5 BLUNT FILL (NEEDLE) IMPLANT
NEEDLE HYPO 21X1.5 SAFETY (NEEDLE) IMPLANT
NEEDLE HYPO 25X1 1.5 SAFETY (NEEDLE) IMPLANT
NEEDLE SPNL 20GX3.5 QUINCKE YW (NEEDLE) IMPLANT
NS IRRIG 1000ML POUR BTL (IV SOLUTION) ×2 IMPLANT
PACK FOAM VITOSS 10CC (Orthopedic Implant) ×2 IMPLANT
PACK LAMINECTOMY NEURO (CUSTOM PROCEDURE TRAY) ×2 IMPLANT
PAD ARMBOARD 7.5X6 YLW CONV (MISCELLANEOUS) ×6 IMPLANT
PATTIES SURGICAL .5 X1 (DISPOSABLE) IMPLANT
RUBBERBAND STERILE (MISCELLANEOUS) ×4 IMPLANT
SPONGE LAP 4X18 X RAY DECT (DISPOSABLE) IMPLANT
SPONGE SURGIFOAM ABS GEL SZ50 (HEMOSTASIS) ×2 IMPLANT
STAPLER VISISTAT 35W (STAPLE) ×2 IMPLANT
STRIP CLOSURE SKIN 1/2X4 (GAUZE/BANDAGES/DRESSINGS) ×2 IMPLANT
SUT VIC AB 0 CT1 18XCR BRD8 (SUTURE) ×1 IMPLANT
SUT VIC AB 0 CT1 8-18 (SUTURE) ×1
SUT VIC AB 2-0 CP2 18 (SUTURE) ×2 IMPLANT
SUT VIC AB 3-0 SH 8-18 (SUTURE) ×2 IMPLANT
SYR 5ML LL (SYRINGE) IMPLANT
TOWEL OR 17X24 6PK STRL BLUE (TOWEL DISPOSABLE) ×2 IMPLANT
TOWEL OR 17X26 10 PK STRL BLUE (TOWEL DISPOSABLE) ×2 IMPLANT
WATER STERILE IRR 1000ML POUR (IV SOLUTION) ×2 IMPLANT

## 2016-03-23 NOTE — Progress Notes (Signed)
Patient ID: [name], female   DOB: Jul 04, 1922, 82 y.o.   MRN: [id number] Post op hematoma secondary to levonox. hemovac re=positioned. Neuro intact

## 2016-03-23 NOTE — Transfer of Care (Signed)
Immediate Anesthesia Transfer of Care Note  Patient: [name]  Procedure(s) Performed: Procedure(s) with comments: Lumbar three-four Laminectomy/Foraminotomy, posterior laterial arthrodesis (N/A) - L3-4 Laminectomy/Foraminotomy  Patient Location: PACU  Anesthesia Type:General  Level of Consciousness: awake, alert , oriented and patient cooperative  Airway & Oxygen Therapy: Patient Spontanous Breathing and Patient connected to nasal cannula oxygen  Post-op Assessment: Report given to RN, Post -op Vital signs reviewed and stable and Patient moving all extremities  Post vital signs: Reviewed and stable  Last Vitals:  Filed Vitals:   03/23/16 1040 03/23/16 1550  BP: 147/91 132/89  Pulse: 74 89  Temp: 36.6 C 36.3 C  Resp: 16 16    Last Pain: There were no vitals filed for this visit.    Patients Stated Pain Goal: 2 ([id number])  Complications: No apparent anesthesia complications swelling around incision site noted, clot in drain tubing- Dr. Joya Salm at the bedside.

## 2016-03-23 NOTE — Anesthesia Procedure Notes (Signed)
Procedure Name: Intubation Date/Time: 03/23/2016 2:15 PM Performed by: Trixie Deis A Pre-anesthesia Checklist: Patient identified, Emergency Drugs available, Suction available, Patient being monitored and Timeout performed Patient Re-evaluated:Patient Re-evaluated prior to inductionOxygen Delivery Method: Circle system utilized Preoxygenation: Pre-oxygenation with 100% oxygen Intubation Type: IV induction Ventilation: Mask ventilation without difficulty Laryngoscope Size: Mac and 3 Grade View: Grade I Tube type: Oral Tube size: 7.0 mm Number of attempts: 1 Airway Equipment and Method: Stylet Placement Confirmation: ETT inserted through vocal cords under direct vision,  positive ETCO2 and breath sounds checked- equal and bilateral Secured at: 21 cm Tube secured with: Tape Dental Injury: Teeth and Oropharynx as per pre-operative assessment

## 2016-03-23 NOTE — Anesthesia Preprocedure Evaluation (Addendum)
Anesthesia Evaluation  Patient identified by MRN, date of birth, ID band Patient awake    Reviewed: Allergy & Precautions, NPO status , Patient's Chart, lab work & pertinent test results, reviewed documented beta blocker date and time   Airway Mallampati: II  TM Distance: >3 FB Neck ROM: Full    Dental  (+) Dental Advisory Given   Pulmonary COPD, former smoker,    breath sounds clear to auscultation       Cardiovascular hypertension, Pt. on medications and Pt. on home beta blockers + angina + Past MI, + Peripheral Vascular Disease and +CHF  + dysrhythmias  Rhythm:Regular Rate:Normal  Echo 02/02/16:  - Left ventricle: The cavity size was normal. Systolic function was moderately to severely reduced. The estimated ejection fraction was in the range of 30% to 35%. Hypokinesis of the anterior myocardium. Hypokinesis of the anteroseptal myocardium. Left ventricular diastolic function parameters were normal. - Aortic root: The aortic root was mildly dilated, 3.4 cm, ascending aorta mildly dilated 3.5 cm - Mitral valve: There was mild regurgitation. - Left atrium: The atrium was mildly dilated. - Right atrium: The atrium was mildly dilated. - Tricuspid valve: There was mild-moderate regurgitation. - Pulmonary arteries: PA peak pressure: 33 mm Hg (S).  Nuclear stress test 07/02/15: Pharmacological myocardial perfusion imaging study with no significant ischemia. EF estimated at 41%,   Neuro/Psych Spinal stenosis    GI/Hepatic Neg liver ROS, GERD  ,  Endo/Other  Hypothyroidism   Renal/GU Renal InsufficiencyRenal disease     Musculoskeletal  (+) Arthritis ,   Abdominal   Peds  Hematology  (+) anemia ,   Anesthesia Other Findings   Reproductive/Obstetrics                            Lab Results  Component Value Date   WBC 6.2 03/15/2016   HGB 11.9* 03/15/2016   HCT 38.3 03/15/2016   MCV 100.3*  03/15/2016   PLT 122* 03/15/2016   Lab Results  Component Value Date   CREATININE 1.22* 03/15/2016   BUN 19 03/15/2016   NA 140 03/15/2016   K 5.5* 03/15/2016   CL 108 03/15/2016   CO2 24 03/15/2016   Lab Results  Component Value Date   INR 1.15 03/23/2016   INR 2.1 03/11/2016   INR 1.9 02/12/2016    Anesthesia Physical Anesthesia Plan  ASA: III  Anesthesia Plan: General   Post-op Pain Management:    Induction: Intravenous  Airway Management Planned: Oral ETT  Additional Equipment: Arterial line  Intra-op Plan:   Post-operative Plan: Extubation in OR and Possible Post-op intubation/ventilation  Informed Consent: I have reviewed the patients History and Physical, chart, labs and discussed the procedure including the risks, benefits and alternatives for the proposed anesthesia with the patient or authorized representative who has indicated his/her understanding and acceptance.   Dental advisory given  Plan Discussed with: CRNA  Anesthesia Plan Comments:         Anesthesia Quick Evaluation

## 2016-03-24 ENCOUNTER — Inpatient Hospital Stay (HOSPITAL_COMMUNITY): Payer: PPO

## 2016-03-24 ENCOUNTER — Encounter (HOSPITAL_COMMUNITY): Payer: Self-pay | Admitting: Neurosurgery

## 2016-03-24 DIAGNOSIS — D62 Acute posthemorrhagic anemia: Secondary | ICD-10-CM | POA: Diagnosis not present

## 2016-03-24 DIAGNOSIS — I255 Ischemic cardiomyopathy: Secondary | ICD-10-CM

## 2016-03-24 DIAGNOSIS — I959 Hypotension, unspecified: Secondary | ICD-10-CM | POA: Insufficient documentation

## 2016-03-24 DIAGNOSIS — Z419 Encounter for procedure for purposes other than remedying health state, unspecified: Secondary | ICD-10-CM

## 2016-03-24 DIAGNOSIS — I5022 Chronic systolic (congestive) heart failure: Secondary | ICD-10-CM | POA: Diagnosis not present

## 2016-03-24 DIAGNOSIS — J181 Lobar pneumonia, unspecified organism: Secondary | ICD-10-CM | POA: Diagnosis not present

## 2016-03-24 DIAGNOSIS — M4806 Spinal stenosis, lumbar region: Secondary | ICD-10-CM | POA: Diagnosis not present

## 2016-03-24 DIAGNOSIS — I482 Chronic atrial fibrillation: Secondary | ICD-10-CM | POA: Diagnosis not present

## 2016-03-24 DIAGNOSIS — I10 Essential (primary) hypertension: Secondary | ICD-10-CM

## 2016-03-24 LAB — CBC WITH DIFFERENTIAL/PLATELET
BASOS PCT: 0 %
Basophils Absolute: 0 10*3/uL (ref 0.0–0.1)
EOS ABS: 0 10*3/uL (ref 0.0–0.7)
EOS PCT: 0 %
HEMATOCRIT: 25.1 % — AB (ref 36.0–46.0)
Hemoglobin: 8 g/dL — ABNORMAL LOW (ref 12.0–15.0)
Lymphocytes Relative: 11 %
Lymphs Abs: 1.1 10*3/uL (ref 0.7–4.0)
MCH: 31.7 pg (ref 26.0–34.0)
MCHC: 31.9 g/dL (ref 30.0–36.0)
MCV: 99.6 fL (ref 78.0–100.0)
MONO ABS: 1.6 10*3/uL — AB (ref 0.1–1.0)
MONOS PCT: 15 %
Neutro Abs: 7.8 10*3/uL — ABNORMAL HIGH (ref 1.7–7.7)
Neutrophils Relative %: 74 %
Platelets: 84 10*3/uL — ABNORMAL LOW (ref 150–400)
RBC: 2.52 MIL/uL — ABNORMAL LOW (ref 3.87–5.11)
RDW: 13.2 % (ref 11.5–15.5)
WBC: 10.5 10*3/uL (ref 4.0–10.5)

## 2016-03-24 LAB — CBC
HCT: 24.6 % — ABNORMAL LOW (ref 36.0–46.0)
Hemoglobin: 7.8 g/dL — ABNORMAL LOW (ref 12.0–15.0)
MCH: 31.6 pg (ref 26.0–34.0)
MCHC: 31.7 g/dL (ref 30.0–36.0)
MCV: 99.6 fL (ref 78.0–100.0)
PLATELETS: 77 10*3/uL — AB (ref 150–400)
RBC: 2.47 MIL/uL — AB (ref 3.87–5.11)
RDW: 13.2 % (ref 11.5–15.5)
WBC: 9.4 10*3/uL (ref 4.0–10.5)

## 2016-03-24 MED ORDER — SIMVASTATIN 5 MG PO TABS
10.0000 mg | ORAL_TABLET | Freq: Every day | ORAL | Status: DC
  Administered 2016-03-24 – 2016-04-01 (×9): 10 mg via ORAL
  Filled 2016-03-24 (×9): qty 2

## 2016-03-24 MED ORDER — METOPROLOL TARTRATE 25 MG PO TABS
25.0000 mg | ORAL_TABLET | Freq: Two times a day (BID) | ORAL | Status: DC
  Administered 2016-03-26 – 2016-04-02 (×14): 25 mg via ORAL
  Filled 2016-03-24 (×16): qty 1

## 2016-03-24 MED ORDER — ONE-DAILY MULTI VITAMINS PO TABS: 1.0000 | ORAL_TABLET | Freq: Every day | ORAL | Status: DC

## 2016-03-24 MED ORDER — LEVOTHYROXINE SODIUM 88 MCG PO TABS
88.0000 ug | ORAL_TABLET | Freq: Every day | ORAL | Status: DC
  Administered 2016-03-25 – 2016-04-02 (×9): 88 ug via ORAL
  Filled 2016-03-24 (×9): qty 1

## 2016-03-24 MED ORDER — CETYLPYRIDINIUM CHLORIDE 0.05 % MT LIQD
7.0000 mL | Freq: Two times a day (BID) | OROMUCOSAL | Status: DC
  Administered 2016-03-24 – 2016-04-02 (×19): 7 mL via OROMUCOSAL

## 2016-03-24 MED ORDER — FERROUS SULFATE 325 (65 FE) MG PO TABS
325.0000 mg | ORAL_TABLET | Freq: Every day | ORAL | Status: DC
  Administered 2016-03-25 – 2016-04-02 (×8): 325 mg via ORAL
  Filled 2016-03-24 (×8): qty 1

## 2016-03-24 MED ORDER — CYANOCOBALAMIN 1000 MCG/ML IJ KIT: 1.0000 mL | PACK | INTRAMUSCULAR | Status: DC

## 2016-03-24 MED ORDER — LISINOPRIL 2.5 MG PO TABS
2.5000 mg | ORAL_TABLET | Freq: Every day | ORAL | Status: DC
  Administered 2016-03-27 – 2016-04-01 (×6): 2.5 mg via ORAL
  Filled 2016-03-24 (×7): qty 1

## 2016-03-24 MED ORDER — MIDODRINE HCL 5 MG PO TABS
5.0000 mg | ORAL_TABLET | Freq: Three times a day (TID) | ORAL | Status: DC
  Administered 2016-03-24 – 2016-04-02 (×26): 5 mg via ORAL
  Filled 2016-03-24 (×26): qty 1

## 2016-03-24 MED ORDER — MIDODRINE HCL 5 MG PO TABS: 10.0000 mg | ORAL_TABLET | Freq: Three times a day (TID) | ORAL | Status: DC

## 2016-03-24 MED ORDER — ADULT MULTIVITAMIN W/MINERALS CH
1.0000 | ORAL_TABLET | Freq: Every day | ORAL | Status: DC
  Administered 2016-03-25 – 2016-04-02 (×9): 1 via ORAL
  Filled 2016-03-24 (×9): qty 1

## 2016-03-24 NOTE — Consult Note (Signed)
[facility] Medical Consultation  [name] CYE:[id number] DOB: [birth year] DOA: 03/23/2016 PCP: [name] Pardon, MD   Requesting physician: botero Date of consultation: 03/24/16 Reason for consultation: acute blood loss anemia/hypotension  Impression/Recommendations Principal Problem:   Acute blood loss anemia Active Problems:   Essential hypertension   Cardiomyopathy, ischemic   Chronic atrial fibrillation (HCC)   Chronic systolic CHF (congestive heart failure) (HCC)   Lumbar stenosis with neurogenic claudication  #1. Acute blood loss anemia secondary to surgery 2 days ago in the setting of anticoagulation. Hemoglobin today 8.0. Chart review indicates hemoglobin 11.9 preoperatively. She is not tachycardic does have increased oxygen demand.  -Serial CBCs -Evaluated by cardiology who recommended transfusing if hemoglobin 7 or less -Monitor and transfuse according to card recommendation  #2. Hypertension. Meds include Lasix as needed, Lopressor, lisinopril. Hx of orthostatic hypotension. Home meds include midodrine. Chart review indicates patient with a history of long QT -Blood pressure on the low end of normal but stable today -We'll resume mididrone -obtain EKG for completeness  #3. Chronic A. fib. On Coumadin. [place] Vascor 7. Seen by cardiology preoperatively and again today. Currently rate controlled. Cardiology recommended that once she is cleared by neurosurgery restart Coumadin per pharmacy considering Lovenox bridge -Continue beta blocker -Continue to hold Coumadin  #4. Chronic systolic and diastolic heart failure. Currently compensated. EF 30-35% in May 2017 -Daily weights -Monitor intake and output -Continue home meds as noted above  #5. Most stenosis with neurogenic claudication. Status post surgical decompression at L3-4 and 4-5.  -Per neurosurgery    TRH followup again tomorrow. Please contact can be of assistance in the meanwhile. Thank you for this  consultation.  Chief Complaint: Acute blood loss anemia hypertension  HPI:  [name] is a 82 year old female with history of coronary artery disease, chronic atrial fib on Coumadin, chronic combined systolic and diastolic heart failure with an EF of 30-35% by echo in May 2017, mitral regurgitation, tricuspid regurgitation, prolonged QT, left bundle branch block, carotid disease, hypertension, thrombocytopenia and lumbar stenosis admitted for elective lumbar decompression on 03/22/2016. [facility] consulted for acute blood loss anemia related to postop hematoma in the setting of anticoagulation.  Patient states pain management "fair". She denies chest pain palpitation shortness of breath. She denies abdominal pain nausea vomiting diarrhea constipation. She denies any dysuria hematuria frequency or urgency. She denies any numbness or tingling of her lower extremities. He denies any difficulty chewing or swallowing  Review of Systems:  10 point review of systems complete and all systems are negative except as indicated in the history of present illness  Past Medical History  Diagnosis Date  . Chronic atrial fibrillation (HCC)     a. on Coumadin (CHA2DS2VASc = 6); b. Holter 06/2010, no brady, mild tachy.  . CAD (coronary artery disease)     a. 2 DES, [facility], 2004; b. nuc 2013 septal dyssenergy 2/2 LBBB, nl LV fxn;  c. 06/2015 MV: EF 41%, septal HK (LBBB), no ischemia->Low risk.  . Pleural effusion associated with pulmonary infection 03-2007  . Hypothyroid   . Hyperlipidemia   . GERD (gastroesophageal reflux disease)   . Diverticulosis of colon   . Allergy   . Anemia   . Arthritis     osteo  . Hypertension   . Mitral regurgitation   . Vitamin B deficiency   . Compression fracture of lumbar vertebra ([place]) 05-2008  . Osteoporosis   . Thrombocytopenia ([place])   . OA (osteoarthritis)   . LBBB (left  bundle branch block)   . Hx of colonoscopy   . Warfarin anticoagulation   . TR  (tricuspid regurgitation)     moderate, echo 2008  . Dyslipidemia   . Lung abnormality     Dr Gwenette Branch 122/2011 no further w/u needed  . Prolonged QT interval     when on sotolol in past  . Essential hypertension   . Weight loss     August, 2012  . Nausea     Some nausea with medications, May, 2013,  . Orthostatic hypotension     June, 2014  . Shingles 2014  . Ischemic cardiomyopathy     a. 03/2015 Echo: EF 30-35%, ant, antsept HK, mod MR, mildly dil LA/RV, mod dil RA, mod-sev TR, PASP 16mHg.  .Marland KitchenHematemesis     a. 03/2015.  . Pulmonary hypertension ([place]     a. 03/2015 PASP 536mg by echo.  . Family history of adverse reaction to anesthesia     daughter PONV  . COPD (chronic obstructive pulmonary disease) ([place]  . [facility]    "mild heart [facility]"  . Wears dentures   . Urinary frequency   . Nocturia   . Wears glasses   . Hard of hearing    Past Surgical History  Procedure Laterality Date  . Thyroidectomy  1962  . Tonsillectomy    . Coronary angioplasty  2004  . Colonoscopy    . Lumbar laminectomy/decompression microdiscectomy N/A 03/23/2016    Procedure: Lumbar three-four Laminectomy/Foraminotomy, posterior laterial arthrodesis;  Surgeon: [name];  [facility];  Service: Neurosurgery;  Laterality: N/A;  L3-4 Laminectomy/Foraminotomy   Social History:  reports that she quit smoking about 68 years ago. She has never used smokeless tobacco. She reports that she does not drink alcohol or use illicit drugs.  Allergies  Allergen Reactions  . Sulfonamide Derivatives Nausea And Vomiting    REACTION: sick  . Alendronate Sodium Other (See Comments)    REACTION: GI   Family History  Problem Relation Age of Onset  . Heart disease Mother 7653. Stroke Father 8231. Heart disease Sister   . Cancer Brother     colon  . Diabetes Brother   . Leukemia Sister     Prior to Admission medications   Medication Sig Start Date End Date  Taking? Authorizing Provider  calcium carbonate (OS-CAL) 600 MG TABS tablet Take 1,200 mg by mouth every evening.    Yes Historical Provider, MD  enoxaparin (LOVENOX) 60 MG/0.6ML injection Inject 0.6 mLs (60 mg total) into the skin every 12 (twelve) hours. 03/15/16  Yes MaAbner GreenspanMD  ferrous sulfate 325 (65 FE) MG tablet Take 325 mg by mouth daily. With lunch   Yes Historical Provider, MD  furosemide (LASIX) 20 MG tablet Take 1 tablet (20 mg total) by mouth daily as needed. Patient taking differently: Take 20 mg by mouth daily as needed for fluid. Daily weight check 05/27/15  Yes PhThayer HeadingsMD  Glucosamine HCl 500 MG TABS Take 500 mg by mouth daily.    Yes Historical Provider, MD  levothyroxine (SYNTHROID, LEVOTHROID) 88 MCG tablet Take 1 tablet (88 mcg total) by mouth daily. 10/24/15  Yes MaMurtaughMD  lisinopril (PRINIVIL,ZESTRIL) 2.5 MG tablet TAKE ONE TABLET AT BEDTIME (8PM) Patient taking differently: Take 2.5 mg by mouth at bedtime. BEDTIME (8PM) 10/30/15  Yes TiMinna MerrittsMD  Lutein 6 MG CAPS Take 1 capsule by  mouth daily.    Yes Historical Provider, MD  metoprolol tartrate (LOPRESSOR) 25 MG tablet Take 1 tablet (25 mg total) by mouth 2 (two) times daily. 10/23/15  Yes Minna Merritts, MD  midodrine (PROAMATINE) 5 MG tablet TAKE ONE TABLET BY MOUTH TWICE DAILY WITH A MEAL 01/16/16  Yes Minna Merritts, MD  mirtazapine (REMERON) 15 MG tablet Take 1 tablet (15 mg total) by mouth at bedtime. 10/24/15  Yes Abner Greenspan, MD  Multiple Vitamin (MULTIVITAMIN) tablet Take 1 tablet by mouth daily.    Yes Historical Provider, MD  pantoprazole (PROTONIX) 40 MG tablet Take 1 tablet (40 mg total) by mouth daily. 10/24/15  Yes Abner Greenspan, MD  potassium chloride (K-DUR,KLOR-CON) 10 MEQ tablet Take 1 tablet (10 mEq total) by mouth daily. Patient taking differently: Take 10 mEq by mouth every other day.  10/24/15  Yes Abner Greenspan, MD  senna-docusate (SENOKOT-S) 8.6-50 MG per tablet Take 1  tablet by mouth at bedtime.   Yes Historical Provider, MD  simvastatin (ZOCOR) 20 MG tablet TAKE 1/2 TABLET  (10MG  TOTAL) EVERY EVENING Patient taking differently: Take 5 mg by mouth every evening.  10/24/15  Yes Abner Greenspan, MD  vitamin B-12 (CYANOCOBALAMIN) 1000 MCG tablet Take 1 tablet (1,000 mcg total) by mouth daily. 06/13/15  Yes Abner Greenspan, MD  warfarin (COUMADIN) 5 MG tablet Take as directed by anti-coagulation clinic. Patient taking differently: Take 5 mg by mouth daily at 6 PM. Take 1 tablet 67m all days except on Monday and Friday take 7.545m3/9/17  Yes MaAbner GreenspanMD  Cyanocobalamin 1000 MCG/ML KIT Inject 1 mL as directed every 8 (eight) weeks. Patient not taking: Reported on 03/11/2016 07/15/15   MaAbner GreenspanMD  fluticasone (FS[facility]50 MCG/ACT nasal spray Place 2 sprays into both nostrils daily. Patient taking differently: Place 2 sprays into both nostrils as needed.  05/27/15   MaAbner GreenspanMD   Physical Exam: Blood pressure 96/39, pulse 88, temperature 98 F (36.7 C), temperature source Oral, resp. rate 18, height 5' 7"  (1.702 m), weight 56.7 kg (125 lb), SpO2 98 %. Filed Vitals:   03/24/16 0957 03/24/16 1357  BP: 100/39 96/39  Pulse: 89 88  Temp: 98.7 F (37.1 C) 98 F (36.7 C)  Resp: 18 18     General:  Thin and frail lethargic but arouses to verbal stimuli and responds appropriately to questions and commands  Eyes: Equal round reactive to light EOMI no scleral icterus  ENT: Ears clear nose without drainage oropharynx without erythema or exudate  Neck: Supple no JVD full range of motion  Cardiovascular: Irregularly irregular +murmur no lower extremity edema  Respiratory: No increased work of breathing breath sounds somewhat distant clear to auscultation, respirations slightly shallow  Abdomen: Non-Distended nontender, positive bowel sounds but sluggish no guarding or rebounding  Skin: Warm and dry no rash  Musculoskeletal: Joints without  swelling/erythema nontender to palpation  Psychiatric: Cooperative pleasant  Neurologic: Alert and oriented to person and place follows commands lethargic but arouses to verbal stimuli  Labs on Admission:  Basic Metabolic Panel: No results for input(s): NA, K, CL, CO2, GLUCOSE, BUN, CREATININE, CALCIUM, MG, PHOS in the last 168 hours. Liver Function Tests: No results for input(s): AST, ALT, ALKPHOS, BILITOT, PROT, ALBUMIN in the last 168 hours. No results for input(s): LIPASE, AMYLASE in the last 168 hours. No results for input(s): AMMONIA in the last 168 hours. CBC:  Recent Labs  Lab 03/24/16 0956  WBC 10.5  NEUTROABS 7.8*  HGB 8.0*  HCT 25.1*  MCV 99.6  PLT 84*   Cardiac Enzymes: No results for input(s): CKTOTAL, CKMB, CKMBINDEX, TROPONINI in the last 168 hours. BNP: Invalid input(s): POCBNP CBG: No results for input(s): GLUCAP in the last 168 hours.  Radiological Exams on Admission: Dg Lumbar Spine 2-3 Views  03/23/2016  CLINICAL DATA:  L3-4 laminectomy. EXAM: LUMBAR SPINE - 2-3 VIEW COMPARISON:  MRI of January 02, 2016. FINDINGS: Two intraoperative cross-table lateral projections were obtained. The first demonstrates surgical probes in the soft tissues posterior to L4. The second demonstrates surgical probes directed toward the posterior margin of the L3-4 disc space. IMPRESSION: Surgical localization as described above. Electronically Signed   By: Marijo Conception, M.D.   On: 03/23/2016 15:48   Dg Spine Portable 1 View  03/23/2016  ADDENDUM REPORT: 03/23/2016 15:22 ADDENDUM: These results were called by telephone at the time of interpretation on 03/23/2016 at the time of interpretation to Dr. Leeroy Cha , who verbally acknowledged these results. Electronically Signed   By: Inge Rise M.D.   On: 03/23/2016 15:22  03/23/2016  CLINICAL DATA:  L3-4 laminectomy and foraminotomy. EXAM: PORTABLE SPINE - 1 VIEW COMPARISON:  MRI lumbar spine 01/02/2016. FINDINGS: Two metallic  probes are in place. The more superior is directed toward the level of the L2-3 disc interspace. The more inferior probe projects over the L4 pedicles. Remote L4 compression fracture is identified. IMPRESSION: Localization as above. Electronically Signed: By: Inge Rise M.D. On: 03/23/2016 15:13    EKG: pending  Time spent: 65 minutes  [facility]  If 7PM-7AM, please contact night-coverage [email or web address] Password [facility] 03/24/2016, 4:40 PM

## 2016-03-24 NOTE — Consult Note (Signed)
Patient ID: URIYAH MASSIMO MRN: [id number], DOB/AGE: 1922-05-21   Admit date: 03/23/2016   Reason for Consult: anticoagulation recommendations Requesting MD: Dr. Joya Salm    Primary Physician: Loura Pardon, MD Primary Cardiologist: Dr. Rockey Situ   Pt. Profile:  82 y.o. female with h/o coronary artery disease, chronic atrial fibrillation on Coumadin, chronic combined systolic and diastolic HF (EF 19-75% by echo in 01/2016), mitral regurgitation, tricuspid regurgitation, prolonged QT, LBBB, carotid disease, HTN, thrombocytopenia and lumbar stenosis, admitted for elective lumbar decompression of L3-L4, L4-L5. Post op course complicated by post op hematoma with acute blood loss anemia. Cardiology consulted for recommendations regarding reinstitution of anticoagulation given increased stroke risk with CHA2DS2 VASc score of 7.   Problem List  Past Medical History  Diagnosis Date  . Chronic atrial fibrillation (HCC)     a. on Coumadin (CHA2DS2VASc = 6); b. Holter 06/2010, no brady, mild tachy.  . CAD (coronary artery disease)     a. 2 DES, [facility], 2004; b. nuc 2013 septal dyssenergy 2/2 LBBB, nl LV fxn;  c. 06/2015 MV: EF 41%, septal HK (LBBB), no ischemia->Low risk.  . Pleural effusion associated with pulmonary infection 03-2007  . Hypothyroid   . Hyperlipidemia   . GERD (gastroesophageal reflux disease)   . Diverticulosis of colon   . Allergy   . Anemia   . Arthritis     osteo  . Hypertension   . Mitral regurgitation   . Vitamin B deficiency   . Compression fracture of lumbar vertebra ([place]) 05-2008  . Osteoporosis   . Thrombocytopenia ([place])   . OA (osteoarthritis)   . LBBB (left bundle branch block)   . Hx of colonoscopy   . Warfarin anticoagulation   . TR (tricuspid regurgitation)     moderate, echo 2008  . Dyslipidemia   . Lung abnormality     Dr [name] 122/2011 no further w/u needed  . Prolonged QT interval     when on sotolol in past  . Essential hypertension   . Weight  loss     August, 2012  . Nausea     Some nausea with medications, May, 2013,  . Orthostatic hypotension     June, 2014  . Shingles 2014  . Ischemic cardiomyopathy     a. 03/2015 Echo: EF 30-35%, ant, antsept HK, mod MR, mildly dil LA/RV, mod dil RA, mod-sev TR, PASP 49mHg.  .Marland KitchenHematemesis     a. 03/2015.  . Pulmonary hypertension ([facility]     a. 03/2015 PASP 559mg by echo.  . Family history of adverse reaction to anesthesia     daughter PONV  . COPD (chronic obstructive pulmonary disease) ([facility]  . [facility]    "mild heart attack"  . Wears dentures   . Urinary frequency   . Nocturia   . Wears glasses   . Hard of hearing     Past Surgical History  Procedure Laterality Date  . Thyroidectomy  1962  . Tonsillectomy    . Coronary angioplasty  2004  . Colonoscopy    . Lumbar laminectomy/decompression microdiscectomy N/A 03/23/2016    Procedure: Lumbar three-four Laminectomy/Foraminotomy, posterior laterial arthrodesis;  Surgeon: ErLeeroy ChaMD;  [facility];  Service: Neurosurgery;  Laterality: N/A;  L3-4 Laminectomy/Foraminotomy     Allergies  Allergies  Allergen Reactions  . Sulfonamide Derivatives Nausea And Vomiting    REACTION: sick  . Alendronate Sodium Other (See Comments)    REACTION: GI  HPI  82 y.o. female with h/o coronary artery disease, chronic atrial fibrillation on Coumadin, chronic combined systolic and diastolic HF (EF 56-21% by echo in 01/2016), mitral regurgitation, tricuspid regurgitation, prolonged QT, LBBB, carotid disease, HTN and thrombocytopenia. She is followed by Dr. Rockey Situ. Her CHA2DS2 VASc score is 7 (CHF, HTN, Age >75 (2), TIA (2) and female sex).  She was admitted for elective neurosurgery, decompression at L3-4, 4-5 for lumbar stenosis. Prior to undergoing surgery, she was cleared by Dr. Rockey Situ.  Her Coumadin was held and she was bridged with Lovenox.  Her surgery was performed 03/22/16 by Dr. Joya Salm. Post op  course was complicated by post op hematoma secondary to Lovenox. Drop in Hgb from 11.9 to 8.0. Cardiology consulted for recommendations regarding anticoagulation. The family also requested  for [place] to get involved.      Home Medications  Prior to Admission medications   Medication Sig Start Date End Date Taking? Authorizing Provider  calcium carbonate (OS-CAL) 600 MG TABS tablet Take 1,200 mg by mouth every evening.    Yes Historical Provider, MD  enoxaparin (LOVENOX) 60 MG/0.6ML injection Inject 0.6 mLs (60 mg total) into the skin every 12 (twelve) hours. 03/15/16  Yes Abner Greenspan, MD  ferrous sulfate 325 (65 FE) MG tablet Take 325 mg by mouth daily. With lunch   Yes Historical Provider, MD  furosemide (LASIX) 20 MG tablet Take 1 tablet (20 mg total) by mouth daily as needed. Patient taking differently: Take 20 mg by mouth daily as needed for fluid. Daily weight check 05/27/15  Yes Thayer Headings, MD  Glucosamine HCl 500 MG TABS Take 500 mg by mouth daily.    Yes Historical Provider, MD  levothyroxine (SYNTHROID, LEVOTHROID) 88 MCG tablet Take 1 tablet (88 mcg total) by mouth daily. 10/24/15  Yes [place], MD  lisinopril (PRINIVIL,ZESTRIL) 2.5 MG tablet TAKE ONE TABLET AT BEDTIME (8PM) Patient taking differently: Take 2.5 mg by mouth at bedtime. BEDTIME (8PM) 10/30/15  Yes Minna Merritts, MD  Lutein 6 MG CAPS Take 1 capsule by mouth daily.    Yes Historical Provider, MD  metoprolol tartrate (LOPRESSOR) 25 MG tablet Take 1 tablet (25 mg total) by mouth 2 (two) times daily. 10/23/15  Yes Minna Merritts, MD  midodrine (PROAMATINE) 5 MG tablet TAKE ONE TABLET BY MOUTH TWICE DAILY WITH A MEAL 01/16/16  Yes Minna Merritts, MD  mirtazapine (REMERON) 15 MG tablet Take 1 tablet (15 mg total) by mouth at bedtime. 10/24/15  Yes Abner Greenspan, MD  Multiple Vitamin (MULTIVITAMIN) tablet Take 1 tablet by mouth daily.    Yes Historical Provider, MD  pantoprazole (PROTONIX) 40 MG tablet Take 1 tablet (40  mg total) by mouth daily. 10/24/15  Yes Abner Greenspan, MD  potassium chloride (K-DUR,KLOR-CON) 10 MEQ tablet Take 1 tablet (10 mEq total) by mouth daily. Patient taking differently: Take 10 mEq by mouth every other day.  10/24/15  Yes Abner Greenspan, MD  senna-docusate (SENOKOT-S) 8.6-50 MG per tablet Take 1 tablet by mouth at bedtime.   Yes Historical Provider, MD  simvastatin (ZOCOR) 20 MG tablet TAKE 1/2 TABLET  ('10MG'$   TOTAL) EVERY EVENING Patient taking differently: Take 5 mg by mouth every evening.  10/24/15  Yes Abner Greenspan, MD  vitamin B-12 (CYANOCOBALAMIN) 1000 MCG tablet Take 1 tablet (1,000 mcg total) by mouth daily. 06/13/15  Yes Abner Greenspan, MD  warfarin (COUMADIN) 5 MG tablet Take as directed by anti-coagulation  clinic. Patient taking differently: Take 5 mg by mouth daily at 6 PM. Take 1 tablet 105m all days except on Monday and Friday take 7.573m3/9/17  Yes MaAbner GreenspanMD  Cyanocobalamin 1000 MCG/ML KIT Inject 1 mL as directed every 8 (eight) weeks. Patient not taking: Reported on 03/11/2016 07/15/15   MaAbner GreenspanMD  fluticasone ([facility] Lc50 MCG/ACT nasal spray Place 2 sprays into both nostrils daily. Patient taking differently: Place 2 sprays into both nostrils as needed.  05/27/15   MaAbner GreenspanMD    Family History  Family History  Problem Relation Age of Onset  . Heart disease Mother 7667. Stroke Father 8215. Heart disease Sister   . Cancer Brother     colon  . Diabetes Brother   . Leukemia Sister     Social History  Social History   Social History  . Marital Status: Widowed    Spouse Name: N/A  . Number of Children: N/A  . Years of Education: N/A   Occupational History  . Not on file.   Social History Main Topics  . Smoking status: Former Smoker    Quit date: 09/14/1947  . Smokeless tobacco: Never Used  . Alcohol Use: No  . Drug Use: No  . Sexual Activity: Not Currently   Other Topics Concern  . Not on file   Social History Narrative   Very  independent    [place]            Review of Systems General:  No chills, fever, night sweats or weight changes.  Cardiovascular:  No chest pain, dyspnea on exertion, edema, orthopnea, palpitations, paroxysmal nocturnal dyspnea. Dermatological: No rash, lesions/masses Respiratory: No cough, dyspnea Urologic: No hematuria, dysuria Abdominal:   No nausea, vomiting, diarrhea, bright red blood per rectum, melena, or hematemesis Neurologic:  No visual changes, wkns, changes in mental status. All other systems reviewed and are otherwise negative except as noted above.  Physical Exam  Blood pressure 96/39, pulse 88, temperature 98 F (36.7 C), temperature source Oral, resp. rate 18, height _0  (1.702 m), weight 125 lb (56.7 kg), SpO2 98 %.  General: Pleasant, NAD, elderly and frail Psych: Normal affect. Neuro: Alert and oriented X 3. Moves all extremities spontaneously. HEENT: Normal  Neck: Supple without bruits or JVD. Lungs:  Resp regular and unlabored, CTA. Heart: irregularly irregular, slightly tachy rate no s3, s4, or murmurs. Abdomen: Soft, non-tender, non-distended, BS + x 4.  Extremities: No clubbing, cyanosis or edema. DP/PT/Radials 2+ and equal bilaterally.  Labs  Troponin (Point of Care Test) No results for input(s): TROPIPOC in the last 72 hours. No results for input(s): CKTOTAL, CKMB, TROPONINI in the last 72 hours. Lab Results  Component Value Date   WBC 10.5 03/24/2016   HGB 8.0* 03/24/2016   HCT 25.1* 03/24/2016   MCV 99.6 03/24/2016   PLT 84* 03/24/2016   No results for input(s): NA, K, CL, CO2, BUN, CREATININE, CALCIUM, PROT, BILITOT, ALKPHOS, ALT, AST, GLUCOSE in the last 168 hours.  Invalid input(s): LABALBU Lab Results  Component Value Date   CHOL 147 05/12/2015   HDL 46.00 05/12/2015   LDLCALC 83 05/12/2015   TRIG 91.0 05/12/2015   Lab Results  Component Value Date   DDIMER * 03/10/2007    1.36        AT THE INHOUSE  ESTABLISHED CUTOFF VALUE OF 0.48 ug/mL FEU, THIS ASSAY HAS BEEN DOCUMENTED IN THE  LITERATURE TO HAVE     Radiology/Studies  Dg Lumbar Spine 2-3 Views  03/23/2016  CLINICAL DATA:  L3-4 laminectomy. EXAM: LUMBAR SPINE - 2-3 VIEW COMPARISON:  MRI of January 02, 2016. FINDINGS: Two intraoperative cross-table lateral projections were obtained. The first demonstrates surgical probes in the soft tissues posterior to L4. The second demonstrates surgical probes directed toward the posterior margin of the L3-4 disc space. IMPRESSION: Surgical localization as described above. Electronically Signed   By: Marijo Conception, M.D.   On: 03/23/2016 15:48   Dg Spine Portable 1 View  03/23/2016  ADDENDUM REPORT: 03/23/2016 15:22 ADDENDUM: These results were called by telephone at the time of interpretation on 03/23/2016 at the time of interpretation to Dr. Leeroy Cha , who verbally acknowledged these results. Electronically Signed   By: Inge Rise M.D.   On: 03/23/2016 15:22  03/23/2016  CLINICAL DATA:  L3-4 laminectomy and foraminotomy. EXAM: PORTABLE SPINE - 1 VIEW COMPARISON:  MRI lumbar spine 01/02/2016. FINDINGS: Two metallic probes are in place. The more superior is directed toward the level of the L2-3 disc interspace. The more inferior probe projects over the L4 pedicles. Remote L4 compression fracture is identified. IMPRESSION: Localization as above. Electronically Signed: By: Inge Rise M.D. On: 03/23/2016 15:13    ECG  Chronic atrial fibrillation   ASSESSMENT AND PLAN  Active Problems:   Lumbar stenosis with neurogenic claudication   1. Lumbar Stenosis w/ Neurogenic Claudication: s/p surgical decompression at L3-4, 4-5.   2. Post Op Hematoma: in the setting of Lovenox, which is now on hold. Drop in Hgb from 11.9 pre-op to 8.0 post op.  T&S ordered. Transfuse if further drop in hgb <7.0. Management per primary team.   3. Chronic Atrial Fibrillation: not on telemetry but borderline  tachy rate by exam. Continue metoprolol for rate control. Coumadin/ Lovenox on hold given recent surgery with bleeding complication.   4. Chronic Anticoagulation: patient was on Coumadin prior to surgery given chronic atrial fibrillation with CHA2DS2 VASc score of 7. Her daughter reports prior h/o TIA. She was being bridged with Lovenox, however she now has a post-op hematoma with acute blood loss anemia. Restart of anticoagulation will depend on neurosurgery's recomendations. If increased bleed risk, continue to hold for now. Once cleared by neurosurgery, would recommend restarting Coumadin with assistance per pharmacy. May need to restart w/o Lovenox bridge. MD to assess and will further advise.   5. Chronic Systolic HF: EF of 92-42%. Volume appears stable. Monitor closely.   6. CAD: stable w/o anginal symptoms. Continue BB, stain + ACE-I.    Signed, Lyda Jester, PA-C 03/24/2016, 2:36 PM

## 2016-03-24 NOTE — Evaluation (Signed)
Physical Therapy Evaluation Patient Details Name: [name] MRN: [id number] DOB: 1921-12-06 Today's Date: 03/24/2016   History of Present Illness  82 yo female s/p L3-4 laminectomy. PMH: CAD, GERD, diverticulosis, arthritis, anemnia, HTn, Osteoporosis, OA, LBBB, COPD   Clinical Impression  Patient is s/p above surgery resulting in the deficits listed below (see PT Problem List). Pt with increased pain and easily fatigue but did tolerate ambulation to the bathroom with assist of 2 people. Patient will benefit from skilled PT to increase their independence and safety with mobility (while adhering to their precautions) to allow discharge to the venue listed below.     Follow Up Recommendations SNF;Supervision/Assistance - 24 hour    Equipment Recommendations  Rolling walker with 5" wheels (TBD by next venue)    Recommendations for Other Services       Precautions / Restrictions Precautions Precautions: Back Precaution Booklet Issued: Yes (comment) Precaution Comments: provided back handout and reviewed in detail.  Required Braces or Orthoses: Spinal Brace Spinal Brace: Lumbar corset;Applied in sitting position Restrictions Weight Bearing Restrictions: No      Mobility  Bed Mobility               General bed mobility comments: pt sitting EOB with OT upon PT arrival  Transfers Overall transfer level: Needs assistance Equipment used: Rolling walker (2 wheeled) Transfers: Sit to/from Stand Sit to Stand: +2 physical assistance;Min assist         General transfer comment: assist for anterior transition, despite flexed posture pt with posterior lean and increased fear of falling  Ambulation/Gait Ambulation/Gait assistance: Min assist;Mod assist;+2 physical assistance;+2 safety/equipment Ambulation Distance (Feet): 15 Feet (x2 (to/from bathroom)) Assistive device: Rolling walker (2 wheeled) Gait Pattern/deviations: WFL(Within Functional Limits);Decreased stride  length;Shuffle (decreased step heigth) Gait velocity: slow Gait velocity interpretation: Below normal speed for age/gender General Gait Details: pt with increased fear of falling, noted bilat knee instability with onset of fatigue progressing to pt requiring mod/maXAx2 to make it to the chair  Stairs            Wheelchair Mobility    Modified Rankin (Stroke Patients Only)       Balance Overall balance assessment: Needs assistance Sitting-balance support: Bilateral upper extremity supported Sitting balance-Leahy Scale: Poor Sitting balance - Comments: posterior lean, min/modA to maintain EOB balance   Standing balance support: Bilateral upper extremity supported Standing balance-Leahy Scale: Zero Standing balance comment: posterior lean                             Pertinent Vitals/Pain Pain Assessment: Faces Pain Score: 8  Pain Location: posterior R hip Pain Descriptors / Indicators: Discomfort Pain Intervention(s): Repositioned    Home Living Family/patient expects to be discharged to:: Skilled nursing facility                 Additional Comments: pt either going to [facility] or [facility]    Prior Function Level of Independence: Independent with assistive device(s)         Comments: Uses rollator      Hand Dominance   Dominant Hand: Right    Extremity/Trunk Assessment   Upper Extremity Assessment: Generalized weakness           Lower Extremity Assessment: Generalized weakness      Cervical / Trunk Assessment: Kyphotic (s/p surg)  Communication   Communication: HOH  Cognition Arousal/Alertness: Awake/alert Behavior During Therapy: WFL for tasks assessed/performed Overall  Cognitive Status: Impaired/Different from baseline Area of Impairment: Attention;Safety/judgement   Current Attention Level: Sustained     Safety/Judgement: Decreased awareness of safety;Decreased awareness of deficits     General Comments:  daughter states "she seems very confused." Pt closing eyes several times during session but when asked states "i do that" pt slow processing and needs incr time. Hard to determine incr time to hear vs cognitive delays    General Comments General comments (skin integrity, edema, etc.): pt with blood drainage from IV    Exercises        Assessment/Plan    PT Assessment Patient needs continued PT services  PT Diagnosis Difficulty walking;Generalized weakness;Acute pain   PT Problem List Decreased strength;Decreased range of motion;Decreased activity tolerance;Decreased balance;Decreased coordination;Decreased mobility;Decreased cognition  PT Treatment Interventions DME instruction;Gait training;Functional mobility training;Therapeutic activities;Therapeutic exercise;Balance training   PT Goals (Current goals can be found in the Care Plan section) Acute Rehab PT Goals Patient Stated Goal: none stated by patient. Daughter wants patient to go to [facility] because she heard the rooms are bigger.  PT Goal Formulation: With patient/family Time For Goal Achievement: 04/07/16 Potential to Achieve Goals: Good    Frequency Min 5X/week   Barriers to discharge        Co-evaluation PT/OT/SLP Co-Evaluation/Treatment: Yes Reason for Co-Treatment: Complexity of the patient's impairments (multi-system involvement) PT goals addressed during session: Mobility/safety with mobility         End of Session Equipment Utilized During Treatment: Gait belt;Back brace Activity Tolerance: Patient limited by fatigue;Patient limited by pain Patient left: in chair;with call bell/phone within reach;with chair alarm set;with family/visitor present Nurse Communication: Mobility status         Time: [id number] PT Time Calculation (min) (ACUTE ONLY): 28 min   Charges:   PT Evaluation $PT Eval Moderate Complexity: 1 Procedure     PT G CodesKingsley Callander 03/24/2016, 3:23 PM    Kittie Plater, PT, DPT Pager #: [phone] Office #: [phone]

## 2016-03-24 NOTE — Evaluation (Signed)
Occupational Therapy Evaluation Patient Details Name: [name] MRN: [id number] DOB: 1922-03-01 Today's Date: 03/24/2016    History of Present Illness 82 yo female s/p L3-4 laminectomy. PMH: CAD, GERD, diverticulosis, arthritis, anemnia, HTn, Osteoporosis, OA, LBBB, COPD    Clinical Impression   Patient is s/p L3-4 laminectomy surgery resulting in functional limitations due to the deficits listed below (see OT problem list). PTA was living at independent living in [facility]).  Patient will benefit from skilled OT acutely to increase independence and safety with ADLS to allow discharge SNF. Family requesting [facility] or [facility] SNF. Pts daughter requesting [facility] from previous admission but considering [facility] because she heard the rooms are bigger.      Follow Up Recommendations  SNF    Equipment Recommendations  3 in 1 bedside comode;Wheelchair (measurements OT);Wheelchair cushion (measurements OT);Hospital bed    Recommendations for Other Services       Precautions / Restrictions Precautions Precautions: Back Precaution Comments: provided back handout and reviewed in detail.  Required Braces or Orthoses: Spinal Brace Spinal Brace: Lumbar corset;Applied in sitting position Restrictions Weight Bearing Restrictions: No      Mobility Bed Mobility Overal bed mobility: Needs Assistance Bed Mobility: Supine to Sit;Rolling Rolling: Mod assist   Supine to sit: Mod assist     General bed mobility comments: cues for sequence and to elevate trunk off bed surface. Pt unable to power up   Transfers Overall transfer level: Needs assistance Equipment used: Rolling walker (2 wheeled) Transfers: Sit to/from Stand Sit to Stand: +2 physical assistance;Min assist         General transfer comment: (A) to power up and for stability. Pt with flexed posture. Pt provided mod cues to elongate trunk.     Balance Overall balance  assessment: Needs assistance Sitting-balance support: Bilateral upper extremity supported;Feet supported Sitting balance-Leahy Scale: Poor     Standing balance support: Bilateral upper extremity supported;During functional activity Standing balance-Leahy Scale: Zero                              ADL Overall ADL's : Needs assistance/impaired     Grooming: Wash/dry face;Set up;Sitting               Lower Body Dressing: Total assistance   Toilet Transfer: +2 for physical assistance;Minimal assistance;Ambulation;BSC;RW Toilet Transfer Details (indicate cue type and reason): cues to reach for 3n1. Pt holding RW and descending to toilet uncontrolled and still holding RW Toileting- Clothing Manipulation and Hygiene: Minimal assistance;Sitting/lateral lean Toileting - Clothing Manipulation Details (indicate cue type and reason): able to complete peri care in sitting     Functional mobility during ADLs: +2 for physical assistance;Minimal assistance;Rolling walker General ADL Comments: pt required total (A) to don brace. pt unable to recall any precautions. pt with eyes closed at times during session. pt with fatigue and incr in pain. pt reports pain at 5 out 10 sitting and 8 out 10 after sitting on toilet/ transfer to recliner. Pt reports pain R LE radiationg to R knee. Pt with L Ue iv bleeding. RN Shaun in room to address x2 during session.      Vision     Perception     Praxis      Pertinent Vitals/Pain Pain Assessment: Faces Pain Score: 8  Pain Location: back R buttock to R knee Pain Descriptors / Indicators: Discomfort;Constant;Radiating;Operative site guarding Pain Intervention(s): Repositioned;Monitored during session;Premedicated  before session     Hand Dominance Right   Extremity/Trunk Assessment Upper Extremity Assessment Upper Extremity Assessment: Generalized weakness   Lower Extremity Assessment Lower Extremity Assessment: Generalized  weakness;Defer to PT evaluation   Cervical / Trunk Assessment Cervical / Trunk Assessment: Kyphotic (s/p surg)   Communication Communication Communication: HOH   Cognition Arousal/Alertness: Awake/alert Behavior During Therapy: WFL for tasks assessed/performed Overall Cognitive Status: Impaired/Different from baseline Area of Impairment: Attention;Safety/judgement   Current Attention Level: Sustained     Safety/Judgement: Decreased awareness of safety;Decreased awareness of deficits     General Comments: daughter states "she seems very confused." Pt closing eyes several times during session but when asked states "i do that" pt slow processing and needs incr time. Hard to determine incr time to hear vs cognitive delays   General Comments       Exercises       Shoulder Instructions      Home Living Family/patient expects to be discharged to:: Skilled nursing facility                                 Additional Comments: pt either going to [facility] or [facility]      Prior Functioning/Environment Level of Independence: Independent with assistive device(s)        Comments: Uses rollator     OT Diagnosis: Generalized weakness;Acute pain;Cognitive deficits   OT Problem List: Decreased strength;Decreased activity tolerance;Impaired balance (sitting and/or standing);Decreased cognition;Decreased safety awareness;Decreased knowledge of use of DME or AE;Decreased knowledge of precautions;Cardiopulmonary status limiting activity;Pain   OT Treatment/Interventions: Self-care/ADL training;Therapeutic exercise;Neuromuscular education;DME and/or AE instruction;Therapeutic activities;Cognitive remediation/compensation;Patient/family education;Balance training    OT Goals(Current goals can be found in the care plan section) Acute Rehab OT Goals Patient Stated Goal: none stated by patient. Daughter wants patient to go to [facility] because she heard the rooms  are bigger.  OT Goal Formulation: With patient/family Time For Goal Achievement: 04/07/16 Potential to Achieve Goals: Good  OT Frequency: Min 2X/week   Barriers to D/C:            Co-evaluation PT/OT/SLP Co-Evaluation/Treatment: Yes Reason for Co-Treatment: Complexity of the patient's impairments (multi-system involvement);For patient/therapist safety   OT goals addressed during session: ADL's and self-care;Strengthening/ROM      End of Session Equipment Utilized During Treatment: Gait belt;Rolling walker;Back brace Nurse Communication: Mobility status;Precautions  Activity Tolerance: Patient tolerated treatment well Patient left: in chair;with call bell/phone within reach;with chair alarm set;with family/visitor present;with nursing/sitter in room   Time: 1029-1107 OT Time Calculation (min): 38 min Charges:  OT General Charges $OT Visit: 1 Procedure OT Treatments $Self Care/Home Management : 8-22 mins G-Codes:    Parke Poisson B 21-Apr-2016, 11:26 AM   Jeri Modena   OTR/L PagerIP:[phone] Office: [phone] .

## 2016-03-24 NOTE — Progress Notes (Signed)
Patient ID: [name], female   DOB: October 25, 1921, 82 y.o.   MRN: [id number] Stable  Seen by internal medicine

## 2016-03-24 NOTE — Care Management Important Message (Signed)
Important Message  Patient Details  Name: [name] MRN: [id number] Date of Birth: 04/03/1922   Medicare Important Message Given:  Yes    Loann Quill 03/24/2016, 11:06 AM

## 2016-03-24 NOTE — Op Note (Signed)
[name], [name]             ACCOUNT NO.:  [id number]  MEDICAL RECORD NO.:  [id number]  LOCATION:  [id number]                        FACILITY:  [facility]  PHYSICIAN:  Leeroy Cha, M.D.   DATE OF BIRTH:  [birth year]  DATE OF PROCEDURE:  03/23/2016 DATE OF DISCHARGE:                              OPERATIVE REPORT   PREOPERATIVE DIAGNOSES:  Lumbar stenosis, L3-L4.  Kyphoscoliosis.  POSTOPERATIVE DIAGNOSES:  Lumbar stenosis, L3-L4.  Kyphoscoliosis.  PROCEDURES:  Bilateral L3-L4 laminectomy, foraminotomy, decompression of the thecal sac as well as the L3 and L4 nerve root.  Partial laminotomy of L5.  Posterolateral arthrodesis with autograft and Vitoss.  CLINICAL HISTORY:  [name] is a 82 year old female in good condition, lived by herself, who had been complaining of back pain radiation to both upper extremity associated with walking.  She ended up in the wheelchair because of the amount of pain that she is having.  MRI showed step-off at the level of 3-4 with severe stenosis.  Nevertheless, although she is 3, she is in good condition mentally and physically and she and her daughter agreed with surgery.  DESCRIPTION OF PROCEDURE:  The patient was taken to the OR, and after intubation, she was positioned on prone manner.  The back was cleaned with DuraPrep.  Drapes were applied.  Midline incision was made. Immediately, x-rays were taken, which showed that we were right at the level of 3-4.  We begin the procedure, removing the spinous process of L3 and L4 and with the drill, we started drilling the lamina of both levels.  The patient had quite a bit of thick calcified ligament bruising the severe stenosis.  Using the 1, 2 and 3-mm Kerrison punch, we went laterally and we did a decompression of the thecal sac. Decompression of the foramen at the level of 3-4 was done.  Partial laminotomy was done just to remove the yellow ligament at the junction of 4-5.  Then, we went laterally  and we removed the periosteum of the lateral aspect of the facet of 3-4 and a mix of autograft and Vitoss were used for arthrodesis.  The area was irrigated.  Small drain was left in the operative site.  The wound was closed with Vicryl and staples.  The patient did well.          ______________________________ Leeroy Cha, M.D.     EB/MEDQ  D:  03/23/2016  T:  03/24/2016  Job:  [id number]

## 2016-03-24 NOTE — Progress Notes (Signed)
Orthopedic Tech Progress Note Patient Details:  [name] [birth year] [id number]  Patient ID: [name], female   DOB: 1922-08-23, 82 y.o.   MRN: [id number] Called in bio-tech brace order; spoke with Ramonita Lab, Tradarius Reinwald 03/24/2016, 9:13 AM

## 2016-03-24 NOTE — Progress Notes (Signed)
RN received DNR paper from patient's daughter. DNR paper put in patient's chart at nurse's station. RN called MD Botero in OR and left message.

## 2016-03-24 NOTE — Progress Notes (Signed)
Patient ID: [name], female   DOB: [birth year], 82 y.o.   MRN: [id number] Stable,c/o incisional pain, no weakness. Family wants [facility] and Cardiology to see her. Pt/ot to help

## 2016-03-25 DIAGNOSIS — M4806 Spinal stenosis, lumbar region: Principal | ICD-10-CM

## 2016-03-25 DIAGNOSIS — I5022 Chronic systolic (congestive) heart failure: Secondary | ICD-10-CM | POA: Diagnosis not present

## 2016-03-25 DIAGNOSIS — I255 Ischemic cardiomyopathy: Secondary | ICD-10-CM | POA: Diagnosis not present

## 2016-03-25 DIAGNOSIS — I10 Essential (primary) hypertension: Secondary | ICD-10-CM | POA: Diagnosis not present

## 2016-03-25 DIAGNOSIS — N390 Urinary tract infection, site not specified: Secondary | ICD-10-CM | POA: Diagnosis not present

## 2016-03-25 DIAGNOSIS — D62 Acute posthemorrhagic anemia: Secondary | ICD-10-CM | POA: Diagnosis not present

## 2016-03-25 DIAGNOSIS — I5023 Acute on chronic systolic (congestive) heart failure: Secondary | ICD-10-CM | POA: Diagnosis not present

## 2016-03-25 DIAGNOSIS — I959 Hypotension, unspecified: Secondary | ICD-10-CM

## 2016-03-25 DIAGNOSIS — I482 Chronic atrial fibrillation: Secondary | ICD-10-CM

## 2016-03-25 LAB — PREPARE RBC (CROSSMATCH)

## 2016-03-25 LAB — BASIC METABOLIC PANEL
ANION GAP: 6 (ref 5–15)
BUN: 21 mg/dL — ABNORMAL HIGH (ref 6–20)
CALCIUM: 8.3 mg/dL — AB (ref 8.9–10.3)
CHLORIDE: 105 mmol/L (ref 101–111)
CO2: 22 mmol/L (ref 22–32)
Creatinine, Ser: 1.19 mg/dL — ABNORMAL HIGH (ref 0.44–1.00)
GFR calc non Af Amer: 38 mL/min — ABNORMAL LOW (ref >60)
GFR, EST AFRICAN AMERICAN: 45 mL/min — AB (ref >60)
Glucose, Bld: 146 mg/dL — ABNORMAL HIGH (ref 65–99)
POTASSIUM: 4.1 mmol/L (ref 3.5–5.1)
Sodium: 133 mmol/L — ABNORMAL LOW (ref 135–145)

## 2016-03-25 LAB — CBC
HEMATOCRIT: 23 % — AB (ref 36.0–46.0)
HEMOGLOBIN: 7.2 g/dL — AB (ref 12.0–15.0)
MCH: 31.3 pg (ref 26.0–34.0)
MCHC: 31.3 g/dL (ref 30.0–36.0)
MCV: 100 fL (ref 78.0–100.0)
Platelets: 71 10*3/uL — ABNORMAL LOW (ref 150–400)
RBC: 2.3 MIL/uL — AB (ref 3.87–5.11)
RDW: 13.3 % (ref 11.5–15.5)
WBC: 8.3 10*3/uL (ref 4.0–10.5)

## 2016-03-25 MED ORDER — SODIUM CHLORIDE 0.9 % IV SOLN
Freq: Once | INTRAVENOUS | Status: AC
  Administered 2016-03-25: 11:00:00 via INTRAVENOUS

## 2016-03-25 NOTE — Progress Notes (Signed)
Patient's 2 Unit PRBC infused and completed as ordered without any adverse effect noted.

## 2016-03-25 NOTE — NC FL2 (Signed)
[place] LEVEL OF CARE SCREENING TOOL     IDENTIFICATION  Patient Name: [name] Birthdate: 23-Oct-1921 Sex: female Admission Date (Current Location): 03/23/2016  [facility] and [place] Number:  Engineering geologist and Address:  [facility] [place]. [facility], [place] [address] [place], [place] [address]      Provider Number: [id number]  Attending Physician Name and Address:  Leeroy Cha, MD  Relative Name and Phone Number:       Current Level of Care: Hospital Recommended Level of Care: [place] Prior Approval Number:    Date Approved/Denied:   PASRR Number: [id number]  Discharge Plan: SNF    Current Diagnoses: Patient Active Problem List   Diagnosis Date Noted  . Acute blood loss anemia 03/24/2016  . Surgery, elective   . Arterial hypotension   . Lumbar stenosis with neurogenic claudication 03/23/2016  . Preop cardiovascular exam 01/27/2016  . Viral URI with cough 09/29/2015  . Vitamin B12 deficiency 07/13/2015  . Fatigue 06/09/2015  . Adjustment disorder with mixed anxiety and depressed mood 06/09/2015  . Generalized weakness 05/12/2015  . Chronic systolic CHF (congestive heart failure) ([place]) 04/29/2015  . Pain in the chest   . Cardiomyopathy, ischemic   . Acute systolic CHF (congestive heart failure) ([place])   . Pulmonary hypertension ([place])   . Adult failure to thrive   . Chronic atrial fibrillation ([place])   . Unstable angina ([place]) 04/07/2015  . Right sided sciatica 10/25/2014  . Venous stasis dermatitis 06/19/2014  . Varicose veins of lower extremities with inflammation 03/25/2014  . Cellulitis 03/08/2014  . Chronic diastolic CHF (congestive heart failure) ([place]) 01/04/2014  . Knee pain 08/27/2013  . Cough 07/03/2013  . Acute bronchitis 07/03/2013  . Orthostatic hypotension   . Acute on chronic combined systolic and diastolic heart failure ([place]) 03/02/2013  . Acute renal failure ([place]) 02/28/2013  . Dehydration  02/28/2013  . Shingles 02/28/2013  . Protein-calorie malnutrition, severe ([place]) 02/28/2013  . Nail fungus 01/10/2013  . Productive cough 02/29/2012  . Nausea   . Left shoulder pain 10/06/2011  . Lumbar pain 10/06/2011  . Hyperlipidemia 07/09/2011  . Vision blurred 07/09/2011  . Hearing loss 07/09/2011  . Weight loss   . A-fib ([place])   . Tachycardia   . CAD (coronary artery disease)   . Mitral regurgitation   . SOB (shortness of breath)   . LBBB (left bundle branch block)   . Warfarin anticoagulation   . Ejection fraction   . TR (tricuspid regurgitation)   . Dyslipidemia   . Lung abnormality   . Prolonged QT interval   . Volume overload   . Coccydynia 12/03/2010  . Nonspecific (abnormal) findings on radiological and other examination of body structure 07/13/2010  . ABNORMAL CHEST XRAY 07/13/2010  . Shortness of breath 06/18/2010  . Essential hypertension 06/24/2009  . GOUT, UNSPECIFIED 03/20/2009  . GERD 03/18/2009  . THROMBOCYTOPENIA 01/03/2009  . ALLERGIC RHINITIS 12/05/2008  . HOARSENESS 06/07/2008  . MEDIASTINAL LYMPHADENOPATHY 05/13/2008  . Other diseases of lung, not elsewhere classified 04/18/2008  . Venous (peripheral) insufficiency 09/13/2007  . FREQUENCY, URINARY 07/19/2007  . VERTIGO, BENIGN PAROXYSMAL POSITION 05/11/2007  . TINNITUS NOS 05/11/2007  . OSTEOPOROSIS NOS 05/11/2007  . HYPOTHYROIDISM 04/11/2007  . ANEMIA, B12 DEFICIENCY 04/11/2007  . HYPOXEMIA 04/11/2007    Orientation RESPIRATION BLADDER Height & Weight     Self, Time, Situation, Place  Normal Continent Weight: 125 lb (56.7 kg) Height:  5\' 7"  (  170.2 cm)  BEHAVIORAL SYMPTOMS/MOOD NEUROLOGICAL BOWEL NUTRITION STATUS   (NONE )  (NONE ) Continent Diet  AMBULATORY STATUS COMMUNICATION OF NEEDS Skin   Extensive Assist Verbally Surgical wounds                       Personal Care Assistance Level of Assistance  Bathing, Feeding, Dressing Bathing Assistance: Maximum assistance Feeding  assistance: Independent Dressing Assistance: Maximum assistance     Functional Limitations Info  Speech, Hearing, Sight Sight Info: Adequate Hearing Info: Adequate Speech Info: Adequate    SPECIAL CARE FACTORS FREQUENCY  PT (By licensed PT), OT (By licensed OT)     PT Frequency: 5 OT Frequency: 2            Contractures      Additional Factors Info  Code Status, Allergies Code Status Info: DNR CODE  Allergies Info: Sulfonamide Derivatives, Alendronate Sodium           Current Medications (03/25/2016):  This is the current hospital active medication list Current Facility-Administered Medications  Medication Dose Route Frequency Provider Last Rate Last Dose  . 0.9 %  sodium chloride infusion  250 mL Intravenous Continuous Leeroy Cha, MD      . acetaminophen (TYLENOL) tablet 650 mg  650 mg Oral Q4H PRN Leeroy Cha, MD       Or  . acetaminophen (TYLENOL) suppository 650 mg  650 mg Rectal Q4H PRN Leeroy Cha, MD      . antiseptic oral rinse (CPC / CETYLPYRIDINIUM CHLORIDE 0.05%) solution 7 mL  7 mL Mouth Rinse BID Leeroy Cha, MD   7 mL at 03/25/16 1008  . diazepam (VALIUM) tablet 5 mg  5 mg Oral Q6H PRN Leeroy Cha, MD   5 mg at 03/24/16 1228  . ferrous sulfate tablet 325 mg  325 mg Oral Q lunch Leeroy Cha, MD   325 mg at 03/25/16 1148  . lactated ringers infusion   Intravenous Continuous Suzette Battiest, MD 10 mL/hr at 03/23/16 1100    . levothyroxine (SYNTHROID, LEVOTHROID) tablet 88 mcg  88 mcg Oral QAC breakfast Leeroy Cha, MD   88 mcg at 03/25/16 0824  . lisinopril (PRINIVIL,ZESTRIL) tablet 2.5 mg  2.5 mg Oral QHS Leeroy Cha, MD   2.5 mg at 03/24/16 2200  . menthol-cetylpyridinium (CEPACOL) lozenge 3 mg  1 lozenge Oral PRN Leeroy Cha, MD       Or  . phenol (CHLORASEPTIC) mouth spray 1 spray  1 spray Mouth/Throat PRN Leeroy Cha, MD      . metoprolol tartrate (LOPRESSOR) tablet 25 mg  25 mg Oral BID Leeroy Cha, MD   25 mg at  03/24/16 2200  . midodrine (PROAMATINE) tablet 5 mg  5 mg Oral TID WC Radene Gunning, NP   5 mg at 03/25/16 1148  . multivitamin with minerals tablet 1 tablet  1 tablet Oral Daily Leeroy Cha, MD   1 tablet at 03/25/16 1005  . oxyCODONE-acetaminophen (PERCOCET/ROXICET) 5-325 MG per tablet 1-2 tablet  1-2 tablet Oral Q4H PRN Leeroy Cha, MD   1 tablet at 03/25/16 1148  . senna (SENOKOT) tablet 8.6 mg  1 tablet Oral BID Leeroy Cha, MD   8.6 mg at 03/25/16 1008  . simvastatin (ZOCOR) tablet 10 mg  10 mg Oral q1800 Leeroy Cha, MD   10 mg at 03/24/16 1800  . sodium chloride flush (NS) 0.9 % injection 3 mL  3 mL Intravenous Q12H Leeroy Cha, MD  3 mL at 03/24/16 2200  . sodium chloride flush (NS) 0.9 % injection 3 mL  3 mL Intravenous PRN Leeroy Cha, MD      . white petrolatum (VASELINE) gel   Topical PRN Leeroy Cha, MD   0.2 application at [id number] 2016     Discharge Medications: Please see discharge summary for a list of discharge medications.  Relevant Imaging Results:  Relevant Lab Results:   Additional Information SSN [id number]  [place]on Axe, MSW, LCSWA [phone] 03/25/2016 3:51 PM

## 2016-03-25 NOTE — Clinical Social Work Placement (Addendum)
   CLINICAL SOCIAL WORK PLACEMENT  NOTE  Date:  03/25/2016  Patient Details  Name: CARLIN MADERO MRN: [id number] Date of Birth: 1922/08/14  Clinical Social Work is seeking post-discharge placement for this patient at the [place] level of care (*CSW will initial, date and re-position this form in  chart as items are completed):  Yes   Patient/family provided with [facility] Work Department's list of facilities offering this level of care within the geographic area requested by the patient (or if unable, by the patient's family).  Yes   Patient/family informed of their freedom to choose among providers that offer the needed level of care, that participate in Medicare, Medicaid or managed care program needed by the patient, have an available bed and are willing to accept the patient.  Yes   Patient/family informed of [facility]'s ownership interest in [facility] and [facility], as well as of the fact that they are under no obligation to receive care at these facilities.  PASRR submitted to EDS on 03/25/16     PASRR number received on 03/25/16     Existing PASRR number confirmed on       FL2 transmitted to all facilities in geographic area requested by pt/family on 03/25/16     FL2 transmitted to all facilities within larger geographic area on       Patient informed that his/her managed care company has contracts with or will negotiate with certain facilities, including the following:         03/29/16  Patient/family informed of bed offers received.  (updated 03-29-16 Evette Cristal, MSW, [place])  Patient chooses bed at  [facility] (updated 03-29-16 Evette Cristal, MSW, [facility])     Physician recommends and patient chooses bed at      Patient to be transferred to   on  .  Patient to be transferred to facility by       Patient family notified on   of transfer.  Name of family member notified:        PHYSICIAN Please sign DNR,  Please sign FL2     Additional Comment:    _______________________________________________ Rozell Searing, LCSW 03/25/2016, 12:16 PM

## 2016-03-25 NOTE — Progress Notes (Signed)
Rehab admissions - Please see rehab consult done by Dr. Letta Pate today recommending SNF.  I will follow along for progress.  Call me for questions.  [phone]

## 2016-03-25 NOTE — Progress Notes (Signed)
DAILY PROGRESS NOTE  Subjective:  Drop in H/h overnight, thought to be related to anticoagulation. Sleepy today, but has received pain medicine. Still able to lay fairly flat, but need to be cautious about volume overload with transfusion.  Objective:  Temp:  [98 F (36.7 C)-98.9 F (37.2 C)] 98.4 F (36.9 C) (07/13 0500) Pulse Rate:  [85-104] 91 (07/13 0500) Resp:  [16-26] 20 (07/13 0500) BP: (96-129)/(39-76) 129/76 mmHg (07/13 0500) SpO2:  [94 %-100 %] 100 % (07/13 0500) Weight change:   Intake/Output from previous day: 07/12 0701 - 07/13 0700 In: -  Out: 50 [Drains:50]  Intake/Output from this shift:    Medications: Current Facility-Administered Medications  Medication Dose Route Frequency Provider Last Rate Last Dose  . 0.9 %  sodium chloride infusion  250 mL Intravenous Continuous Leeroy Cha, MD      . 0.9 %  sodium chloride infusion   Intravenous Continuous Leeroy Cha, MD 50 mL/hr at 03/23/16 2026    . 0.9 %  sodium chloride infusion   Intravenous Once Leeroy Cha, MD      . acetaminophen (TYLENOL) tablet 650 mg  650 mg Oral Q4H PRN Leeroy Cha, MD       Or  . acetaminophen (TYLENOL) suppository 650 mg  650 mg Rectal Q4H PRN Leeroy Cha, MD      . antiseptic oral rinse (CPC / CETYLPYRIDINIUM CHLORIDE 0.05%) solution 7 mL  7 mL Mouth Rinse BID Leeroy Cha, MD   7 mL at 03/24/16 2200  . diazepam (VALIUM) tablet 5 mg  5 mg Oral Q6H PRN Leeroy Cha, MD   5 mg at 03/24/16 1228  . ferrous sulfate tablet 325 mg  325 mg Oral Q lunch Leeroy Cha, MD      . lactated ringers infusion   Intravenous Continuous Suzette Battiest, MD 10 mL/hr at 03/23/16 1100    . levothyroxine (SYNTHROID, LEVOTHROID) tablet 88 mcg  88 mcg Oral QAC breakfast Leeroy Cha, MD   88 mcg at 03/25/16 0824  . lisinopril (PRINIVIL,ZESTRIL) tablet 2.5 mg  2.5 mg Oral QHS Leeroy Cha, MD   2.5 mg at 03/24/16 2200  . menthol-cetylpyridinium (CEPACOL) lozenge 3 mg  1 lozenge  Oral PRN Leeroy Cha, MD       Or  . phenol (CHLORASEPTIC) mouth spray 1 spray  1 spray Mouth/Throat PRN Leeroy Cha, MD      . metoprolol tartrate (LOPRESSOR) tablet 25 mg  25 mg Oral BID Leeroy Cha, MD   25 mg at 03/24/16 2200  . midodrine (PROAMATINE) tablet 5 mg  5 mg Oral TID WC Radene Gunning, NP   5 mg at 03/25/16 0825  . multivitamin with minerals tablet 1 tablet  1 tablet Oral Daily Leeroy Cha, MD      . oxyCODONE-acetaminophen (PERCOCET/ROXICET) 5-325 MG per tablet 1-2 tablet  1-2 tablet Oral Q4H PRN Leeroy Cha, MD   1 tablet at 03/25/16 0734  . senna (SENOKOT) tablet 8.6 mg  1 tablet Oral BID Leeroy Cha, MD   8.6 mg at 03/24/16 2319  . simvastatin (ZOCOR) tablet 10 mg  10 mg Oral q1800 Leeroy Cha, MD   10 mg at 03/24/16 1800  . sodium chloride flush (NS) 0.9 % injection 3 mL  3 mL Intravenous Q12H Leeroy Cha, MD   3 mL at 03/24/16 2200  . sodium chloride flush (NS) 0.9 % injection 3 mL  3 mL Intravenous PRN Leeroy Cha, MD      .  white petrolatum (VASELINE) gel   Topical PRN Leeroy Cha, MD   0.2 application at [id number] 2016    Physical Exam: General appearance: fatigued and no distress Lungs: diminished breath sounds RLL Heart: regular rate and rhythm Extremities: extremities normal, atraumatic, no cyanosis or edema Neurologic: Mental status: Somnolent, but awakens easily and is conversive  Lab Results: Results for orders placed or performed during the hospital encounter of 03/23/16 (from the past 48 hour(s))  PT- INR Day of Surgery     Status: None   Collection Time: 03/23/16 10:43 AM  Result Value Ref Range   Prothrombin Time 14.9 11.6 - 15.2 seconds   INR 1.15 0.00 - 1.49  ABO/Rh     Status: None   Collection Time: 03/23/16  2:37 PM  Result Value Ref Range   ABO/RH(D) A NEG   Type and screen Neuro OR 3     Status: None   Collection Time: 03/23/16  2:42 PM  Result Value Ref Range   ABO/RH(D) A NEG    Antibody Screen NEG    Sample  Expiration 03/26/2016   CBC with Differential/Platelet     Status: Abnormal   Collection Time: 03/24/16  9:56 AM  Result Value Ref Range   WBC 10.5 4.0 - 10.5 K/uL   RBC 2.52 (L) 3.87 - 5.11 MIL/uL   Hemoglobin 8.0 (L) 12.0 - 15.0 g/dL   HCT 25.1 (L) 36.0 - 46.0 %   MCV 99.6 78.0 - 100.0 fL   MCH 31.7 26.0 - 34.0 pg   MCHC 31.9 30.0 - 36.0 g/dL   RDW 13.2 11.5 - 15.5 %   Platelets 84 (L) 150 - 400 K/uL    Comment: SPECIMEN CHECKED FOR CLOTS REPEATED TO VERIFY PLATELET COUNT CONFIRMED BY SMEAR    Neutrophils Relative % 74 %   Neutro Abs 7.8 (H) 1.7 - 7.7 K/uL   Lymphocytes Relative 11 %   Lymphs Abs 1.1 0.7 - 4.0 K/uL   Monocytes Relative 15 %   Monocytes Absolute 1.6 (H) 0.1 - 1.0 K/uL   Eosinophils Relative 0 %   Eosinophils Absolute 0.0 0.0 - 0.7 K/uL   Basophils Relative 0 %   Basophils Absolute 0.0 0.0 - 0.1 K/uL  CBC     Status: Abnormal   Collection Time: 03/24/16  6:40 PM  Result Value Ref Range   WBC 9.4 4.0 - 10.5 K/uL   RBC 2.47 (L) 3.87 - 5.11 MIL/uL   Hemoglobin 7.8 (L) 12.0 - 15.0 g/dL   HCT 24.6 (L) 36.0 - 46.0 %   MCV 99.6 78.0 - 100.0 fL   MCH 31.6 26.0 - 34.0 pg   MCHC 31.7 30.0 - 36.0 g/dL   RDW 13.2 11.5 - 15.5 %   Platelets 77 (L) 150 - 400 K/uL    Comment: CONSISTENT WITH PREVIOUS RESULT  CBC     Status: Abnormal   Collection Time: 03/25/16  4:22 AM  Result Value Ref Range   WBC 8.3 4.0 - 10.5 K/uL   RBC 2.30 (L) 3.87 - 5.11 MIL/uL   Hemoglobin 7.2 (L) 12.0 - 15.0 g/dL   HCT 23.0 (L) 36.0 - 46.0 %   MCV 100.0 78.0 - 100.0 fL   MCH 31.3 26.0 - 34.0 pg   MCHC 31.3 30.0 - 36.0 g/dL   RDW 13.3 11.5 - 15.5 %   Platelets 71 (L) 150 - 400 K/uL    Comment: CONSISTENT WITH PREVIOUS RESULT    Imaging: Dg Lumbar Spine  2-3 Views  03/23/2016  CLINICAL DATA:  L3-4 laminectomy. EXAM: LUMBAR SPINE - 2-3 VIEW COMPARISON:  MRI of January 02, 2016. FINDINGS: Two intraoperative cross-table lateral projections were obtained. The first demonstrates surgical  probes in the soft tissues posterior to L4. The second demonstrates surgical probes directed toward the posterior margin of the L3-4 disc space. IMPRESSION: Surgical localization as described above. Electronically Signed   By: Marijo Conception, M.D.   On: 03/23/2016 15:48   Dg Chest Port 1 View  03/24/2016  CLINICAL DATA:  Tachypnea. Lumbar spine surgery performed the previous day. EXAM: PORTABLE CHEST 1 VIEW COMPARISON:  Chest radiograph 09/29/2015 FINDINGS: Cardiomediastinal silhouette remains enlarged. There is calcification of the aortic arch. There is consolidative opacity at the left lung base. The right lung is clear without pleural effusion. No pneumothorax is visualized on this semi erect chest radiograph. There is mild leftward shift of the mediastinal structures secondary to left hemithoracic volume loss. No significant pulmonary edema. Tubing overlies the left lower chest, possibly outside the patient. Lower thoracic vertebral augmentation cement is again seen. IMPRESSION: 1. Left lung base consolidation with associated left hemithorax volume loss, likely indicating postoperative atelectasis. Pneumonia is considered less likely. 2. Unchanged cardiomegaly. 3. Aortic atherosclerosis. Electronically Signed   By: Ulyses Jarred M.D.   On: 03/24/2016 20:16   Dg Spine Portable 1 View  03/23/2016  ADDENDUM REPORT: 03/23/2016 15:22 ADDENDUM: These results were called by telephone at the time of interpretation on 03/23/2016 at the time of interpretation to Dr. Leeroy Cha , who verbally acknowledged these results. Electronically Signed   By: Inge Rise M.D.   On: 03/23/2016 15:22  03/23/2016  CLINICAL DATA:  L3-4 laminectomy and foraminotomy. EXAM: PORTABLE SPINE - 1 VIEW COMPARISON:  MRI lumbar spine 01/02/2016. FINDINGS: Two metallic probes are in place. The more superior is directed toward the level of the L2-3 disc interspace. The more inferior probe projects over the L4 pedicles. Remote L4  compression fracture is identified. IMPRESSION: Localization as above. Electronically Signed: By: Inge Rise M.D. On: 03/23/2016 15:13    Assessment:  1. Principal Problem: 2.   Acute blood loss anemia 3. Active Problems: 4.   Essential hypertension 5.   Cardiomyopathy, ischemic 6.   Chronic atrial fibrillation (HCC) 7.   Chronic systolic CHF (congestive heart failure) ([place]) 8.   Lumbar stenosis with neurogenic claudication 9.   Surgery, elective 10.   Arterial hypotension 11.   Plan:  1. Agree with transfusion. Would give lasix 40 mg IV x 1 between units. Encourage IS use for atelactasis and minimize pain medication and sedation as much as possible to avoid developing pneumonia. Follow strict I's/O's if possible and check daily weights.  Time Spent Directly with Patient:  15 minutes  Length of Stay:  LOS: 2 days   Pixie Casino, MD, [facility] Attending Cardiologist [place] 03/25/2016, 9:07 AM

## 2016-03-25 NOTE — Progress Notes (Addendum)
PROGRESS NOTE  [name]  [name] DOB: 16-Nov-1921  DOA: 03/23/2016 PCP: Loura Pardon, MD  Primary Cardiologist: Dr. Ida Rogue  Brief Narrative:  82 year old female patient with a PMH of chronic atrial fibrillation on Coumadin, cardiomyopathy with LVEF 30-35 percent, chronic combined systolic and diastolic CHF, CAD, MR, TR, prolonged QT, LBBB, carotid disease, HTN, chronic thrombocytopenia, hypothyroid, HLD, GERD and lumbar stenosis, underwent elective lumbar decompression of L3-4, L4-5 on 7/12 and postop course complicated by postop surgical site hematoma, acute blood loss anemia and hypotension. Hospitalists were consulted on 7/12 for assistance. Cardiology consulting as well.   Assessment & Plan:   Principal Problem:   Acute blood loss anemia Active Problems:   Essential hypertension   Cardiomyopathy, ischemic   Chronic atrial fibrillation (HCC)   Chronic systolic CHF (congestive heart failure) (HCC)   Lumbar stenosis with neurogenic claudication   Surgery, elective   Arterial hypotension   Lumbar stenosis with neurogenic claudication - Status post surgical decompression of L3-4, 4-5 on 7/12. - Still has her surgical site drain with some blood draining. Management per primary service. - Recommend minimizing opioid analgesics as much as possible to avoid sedation.  Postop hematoma/acute blood loss anemia - In the setting of Lovenox (now held). - Hemoglobin dropped from 11.9 g preop to 8 g postop and down to 7.2 g on 7/13 - Recommend one unit of PRBC slowly over 3-4 hours (discussed with patient's RN). Discussed with Dr. Joya Salm  Chronic thrombocytopenia - Preop platelets 122 on 7/3. In the 70s over the last 24 hours-likely complicated by acute blood loss anemia. Follow CBCs daily.  Essential hypertension - Controlled. Continue lisinopril and metoprolol.  Chronic atrial fibrillation - Patient was on Coumadin prior to surgery. - CHA2DS2 VASc score of 7. -  Postop, was being bridged with Lovenox and then developed postop hematoma with acute blood loss anemia. Lovenox held. - Restart anticoagulation when okay with neurosurgery and as per cardiology, may restart Coumadin without Lovenox/heparin bridging.  Chronic systolic CHF/ischemic cardiomyopathy - LVEF 30-35 percent. Seems compensated and euvolemic.  CAD - Asymptomatic of chest pain. Cardiology following.  Atelectasis  - Likely secondary to poor inspiratory effort related to pain and sedation. Minimize sedative medications and encourage out of bed and incentive spirometry.  Hypotension/orthostatic hypotension - Continue midodrine. Blood pressures have stabilized.  Hypothyroid. - Continue Synthroid.  Stage III chronic kidney disease/hyperkalemia - Creatinine on 03/15/16:1.22. No BMP since then. Potassium was also mildly elevated at 5.5. Check BMP stat. Of note, patient on low-dose lisinopril and had hypotension yesterday.    DVT prophylaxis: SCDs Code Status: DO NOT RESUSCITATE Family Communication: Discussed in detail with patient's daughter at bedside. Updated care and answered questions. Disposition Plan: To be determined. Not medically stable for discharge.   Consultants:   [facility] - are consultants  Cardiology   Procedures:   L3-4, L4-5 lumbar decompression surgery on 03/24/16.  Antimicrobials:   None.    Subjective: Somnolent, briefly opens eyes to call oriented to self only. As per daughter at bedside, had been up to bedside commode with assistance for urinating. Received pain meds short while prior to visit.  Objective:  Filed Vitals:   03/25/16 0113 03/25/16 0500 03/25/16 0908 03/25/16 1045  BP: 100/42 129/76 101/44 107/53  Pulse: 85 91 90 86  Temp: 98.1 F (36.7 C) 98.4 F (36.9 C) 97.9 F (36.6 C) 97.8 F (36.6 C)  TempSrc: Oral Oral Oral Oral  Resp: 18 20 20 18   Height:  Weight:      SpO2: 99% 100% 100% 100%    Intake/Output  Summary (Last 24 hours) at 03/25/16 1113 Last data filed at 03/25/16 1045  Gross per 24 hour  Intake     50 ml  Output     50 ml  Net      0 ml   Filed Weights   03/23/16 1040  Weight: 56.7 kg (125 lb)    Examination:  General exam: Elderly female, small built and frail, lying comfortably propped up in bed without distress. Respiratory system: Reduced breath sounds in the bases. Rest of lung fields clear to auscultation. Poor inspiratory effort.  Cardiovascular system: S1 & S2 heard, irregularly irregular. No JVD, rubs, gallops or clicks. No pedal edema. Systolic ejection murmur grade 3 x 6 best heard at apex. Gastrointestinal system: Abdomen is nondistended, soft and nontender. No organomegaly or masses felt. Normal bowel sounds heard. Central nervous system: Somnolent, arousable to call and oriented only to self. No focal neurological deficits. Extremities: Moves all limbs symmetrically Skin: No rashes, lesions or ulcers Psychiatry: Cannot assess due to sedation.    Data Reviewed: I have personally reviewed following labs and imaging studies  CBC:  Recent Labs Lab 03/24/16 0956 03/24/16 1840 03/25/16 0422  WBC 10.5 9.4 8.3  NEUTROABS 7.8*  --   --   HGB 8.0* 7.8* 7.2*  HCT 25.1* 24.6* 23.0*  MCV 99.6 99.6 100.0  PLT 84* 77* 71*   Basic Metabolic Panel: No results for input(s): NA, K, CL, CO2, GLUCOSE, BUN, CREATININE, CALCIUM, MG, PHOS in the last 168 hours. GFR: Estimated Creatinine Clearance: 26.3 mL/min (by C-G formula based on Cr of 1.22). Liver Function Tests: No results for input(s): AST, ALT, ALKPHOS, BILITOT, PROT, ALBUMIN in the last 168 hours. No results for input(s): LIPASE, AMYLASE in the last 168 hours. No results for input(s): AMMONIA in the last 168 hours. Coagulation Profile:  Recent Labs Lab 03/23/16 1043  INR 1.15   Cardiac Enzymes: No results for input(s): CKTOTAL, CKMB, CKMBINDEX, TROPONINI in the last 168 hours. BNP (last 3 results) No  results for input(s): PROBNP in the last 8760 hours. HbA1C: No results for input(s): HGBA1C in the last 72 hours. CBG: No results for input(s): GLUCAP in the last 168 hours. Lipid Profile: No results for input(s): CHOL, HDL, LDLCALC, TRIG, CHOLHDL, LDLDIRECT in the last 72 hours. Thyroid Function Tests: No results for input(s): TSH, T4TOTAL, FREET4, T3FREE, THYROIDAB in the last 72 hours. Anemia Panel: No results for input(s): VITAMINB12, FOLATE, FERRITIN, TIBC, IRON, RETICCTPCT in the last 72 hours.  Sepsis Labs: No results for input(s): PROCALCITON, LATICACIDVEN in the last 168 hours.  Recent Results (from the past 240 hour(s))  Surgical pcr screen     Status: None   Collection Time: 03/15/16  3:01 PM  Result Value Ref Range Status   MRSA, PCR NEGATIVE NEGATIVE Final   Staphylococcus aureus NEGATIVE NEGATIVE Final    Comment:        The Xpert SA Assay (FDA approved for NASAL specimens in patients over 6 years of age), is one component of a comprehensive surveillance program.  Test performance has been validated by [facility] for patients greater than or equal to 40 year old. It is not intended to diagnose infection nor to guide or monitor treatment.          Radiology Studies: Dg Lumbar Spine 2-3 Views  03/23/2016  CLINICAL DATA:  L3-4 laminectomy. EXAM: LUMBAR SPINE - 2-3  VIEW COMPARISON:  MRI of January 02, 2016. FINDINGS: Two intraoperative cross-table lateral projections were obtained. The first demonstrates surgical probes in the soft tissues posterior to L4. The second demonstrates surgical probes directed toward the posterior margin of the L3-4 disc space. IMPRESSION: Surgical localization as described above. Electronically Signed   By: Marijo Conception, M.D.   On: 03/23/2016 15:48   Dg Chest Port 1 View  03/24/2016  CLINICAL DATA:  Tachypnea. Lumbar spine surgery performed the previous day. EXAM: PORTABLE CHEST 1 VIEW COMPARISON:  Chest radiograph 09/29/2015  FINDINGS: Cardiomediastinal silhouette remains enlarged. There is calcification of the aortic arch. There is consolidative opacity at the left lung base. The right lung is clear without pleural effusion. No pneumothorax is visualized on this semi erect chest radiograph. There is mild leftward shift of the mediastinal structures secondary to left hemithoracic volume loss. No significant pulmonary edema. Tubing overlies the left lower chest, possibly outside the patient. Lower thoracic vertebral augmentation cement is again seen. IMPRESSION: 1. Left lung base consolidation with associated left hemithorax volume loss, likely indicating postoperative atelectasis. Pneumonia is considered less likely. 2. Unchanged cardiomegaly. 3. Aortic atherosclerosis. Electronically Signed   By: Ulyses Jarred M.D.   On: 03/24/2016 20:16   Dg Spine Portable 1 View  03/23/2016  ADDENDUM REPORT: 03/23/2016 15:22 ADDENDUM: These results were called by telephone at the time of interpretation on 03/23/2016 at the time of interpretation to Dr. Leeroy Cha , who verbally acknowledged these results. Electronically Signed   By: Inge Rise M.D.   On: 03/23/2016 15:22  03/23/2016  CLINICAL DATA:  L3-4 laminectomy and foraminotomy. EXAM: PORTABLE SPINE - 1 VIEW COMPARISON:  MRI lumbar spine 01/02/2016. FINDINGS: Two metallic probes are in place. The more superior is directed toward the level of the L2-3 disc interspace. The more inferior probe projects over the L4 pedicles. Remote L4 compression fracture is identified. IMPRESSION: Localization as above. Electronically Signed: By: Inge Rise M.D. On: 03/23/2016 15:13        Scheduled Meds: . sodium chloride   Intravenous Once  . antiseptic oral rinse  7 mL Mouth Rinse BID  . ferrous sulfate  325 mg Oral Q lunch  . levothyroxine  88 mcg Oral QAC breakfast  . lisinopril  2.5 mg Oral QHS  . metoprolol tartrate  25 mg Oral BID  . midodrine  5 mg Oral TID WC  .  multivitamin with minerals  1 tablet Oral Daily  . senna  1 tablet Oral BID  . simvastatin  10 mg Oral q1800  . sodium chloride flush  3 mL Intravenous Q12H   Continuous Infusions: . sodium chloride    . sodium chloride 50 mL/hr at 03/23/16 2026  . lactated ringers 10 mL/hr at 03/23/16 1100     LOS: 2 days    Time spent: 45 minutes.    [facility], MD [facility] Pager [phone]  If 7PM-7AM, please contact night-coverage [email or web address] Password [email or web address] 03/25/2016, 11:13 AM

## 2016-03-25 NOTE — Progress Notes (Signed)
Patient ID: [name], female   DOB: Dec 13, 1921, 82 y.o.   MRN: [id number] Hb 7.2  hemovac stii draining. No weakness. Plan to get 2 units of rbc. hospitalist service helping. Rehab to see

## 2016-03-25 NOTE — Consult Note (Addendum)
Physical Medicine and Rehabilitation Consult Reason for Consult: Lumbar stenosis with radiculopathy Referring Physician: Dr. Joya Salm   HPI: [name] is a 82 y.o. right handed female with history of chronic atrial fibrillation on Coumadin therapy. Presented 03/22/2016 with low back pain radiating to the lower extremities. Patient independent with rolling walker prior to admission living at [facility] in [facility]. X-rays and imaging revealed lumbar stenosis L3-4 with kyphoscoliosis as well as radiculopathy. Underwent bilateral L3-4 laminectomy foraminotomy decompression of the thecal sac as well as nerve root 03/24/2016 per Dr. Joya Salm. Back brace applied to sitting position. Acute blood loss anemia 7.2 and transfuse today. Chronic Coumadin currently on hold due to risk of bleeding. Physical occupational therapy evaluations completed. M.D. has requested physical medicine rehabilitation consult.  Daughter states  the patient becomes somnolent after taking her Percocet. Patient still complaining of pain after her pain medication  Review of Systems  Constitutional: Negative for fever and chills.  HENT: Positive for hearing loss.   Eyes: Negative for blurred vision and double vision.  Respiratory: Negative for cough and shortness of breath.   Cardiovascular: Positive for palpitations. Negative for chest pain and leg swelling.  Gastrointestinal: Positive for nausea and constipation. Negative for vomiting.       GERD  Genitourinary: Negative for dysuria and hematuria.  Musculoskeletal: Positive for myalgias and back pain.  Skin: Negative for rash.  Neurological: Positive for weakness. Negative for seizures and headaches.  All other systems reviewed and are negative.  Past Medical History  Diagnosis Date  . Chronic atrial fibrillation (HCC)     a. on Coumadin (CHA2DS2VASc = 6); b. Holter 06/2010, no brady, mild tachy.  . CAD (coronary  artery disease)     a. 2 DES, [facility], 2004; b. nuc 2013 septal dyssenergy 2/2 LBBB, nl LV fxn;  c. 06/2015 MV: EF 41%, septal HK (LBBB), no ischemia->Low risk.  . Pleural effusion associated with pulmonary infection 03-2007  . Hypothyroid   . Hyperlipidemia   . GERD (gastroesophageal reflux disease)   . Diverticulosis of colon   . Allergy   . Anemia   . Arthritis     osteo  . Hypertension   . Mitral regurgitation   . Vitamin B deficiency   . Compression fracture of lumbar vertebra ([place]) 05-2008  . Osteoporosis   . Thrombocytopenia ([place])   . OA (osteoarthritis)   . LBBB (left bundle branch block)   . Hx of colonoscopy   . Warfarin anticoagulation   . TR (tricuspid regurgitation)     moderate, echo 2008  . Dyslipidemia   . Lung abnormality     Dr [name] 122/2011 no further w/u needed  . Prolonged QT interval     when on sotolol in past  . Essential hypertension   . Weight loss     August, 2012  . Nausea     Some nausea with medications, May, 2013,  . Orthostatic hypotension     June, 2014  . Shingles 2014  . Ischemic cardiomyopathy     a. 03/2015 Echo: EF 30-35%, ant, antsept HK, mod MR, mildly dil LA/RV, mod dil RA, mod-sev TR, PASP 67mHg.  .Marland KitchenHematemesis     a. 03/2015.  . Pulmonary hypertension ([place]     a. 03/2015 PASP 569mg by echo.  . Family history of adverse reaction to anesthesia     daughter PONV  . COPD [place] obstructive [place] disease) [place]  .  Myocardial infarction [facility]) 2016    "mild heart attack"  . Wears dentures   . Urinary frequency   . Nocturia   . Wears glasses   . Hard of hearing    Past Surgical History  Procedure Laterality Date  . Thyroidectomy  1962  . Tonsillectomy    . Coronary angioplasty  2004  . Colonoscopy    . Lumbar laminectomy/decompression microdiscectomy N/A 03/23/2016    Procedure: Lumbar three-four Laminectomy/Foraminotomy, posterior laterial arthrodesis;  Surgeon: Leeroy Cha, MD;  Location: [facility] NEURO ORS;  Service:  Neurosurgery;  Laterality: N/A;  L3-4 Laminectomy/Foraminotomy   Family History  Problem Relation Age of Onset  . Heart disease Mother 55  . Stroke Father 26  . Heart disease Sister   . Cancer Brother     colon  . Diabetes Brother   . Leukemia Sister    Social History:  reports that she quit smoking about 68 years ago. She has never used smokeless tobacco. She reports that she does not drink alcohol or use illicit drugs. Allergies:  Allergies  Allergen Reactions  . Sulfonamide Derivatives Nausea And Vomiting    REACTION: sick  . Alendronate Sodium Other (See Comments)    REACTION: GI   Medications Prior to Admission  Medication Sig Dispense Refill  . calcium carbonate (OS-CAL) 600 MG TABS tablet Take 1,200 mg by mouth every evening.     . enoxaparin (LOVENOX) 60 MG/0.6ML injection Inject 0.6 mLs (60 mg total) into the skin every 12 (twelve) hours. 3.6 mL 1  . ferrous sulfate 325 (65 FE) MG tablet Take 325 mg by mouth daily. With lunch    . furosemide (LASIX) 20 MG tablet Take 1 tablet (20 mg total) by mouth daily as needed. (Patient taking differently: Take 20 mg by mouth daily as needed for fluid. Daily weight check) 90 tablet 3  . Glucosamine HCl 500 MG TABS Take 500 mg by mouth daily.     [name] levothyroxine (SYNTHROID, LEVOTHROID) 88 MCG tablet Take 1 tablet (88 mcg total) by mouth daily. 90 tablet 1  . lisinopril (PRINIVIL,ZESTRIL) 2.5 MG tablet TAKE ONE TABLET AT BEDTIME (8PM) (Patient taking differently: Take 2.5 mg by mouth at bedtime. BEDTIME (8PM)) 90 tablet 3  . Lutein 6 MG CAPS Take 1 capsule by mouth daily.     . metoprolol tartrate (LOPRESSOR) 25 MG tablet Take 1 tablet (25 mg total) by mouth 2 (two) times daily. 180 tablet 3  . midodrine (PROAMATINE) 5 MG tablet TAKE ONE TABLET BY MOUTH TWICE DAILY WITH A MEAL 180 tablet 3  . mirtazapine (REMERON) 15 MG tablet Take 1 tablet (15 mg total) by mouth at bedtime. 90 tablet 3  . Multiple Vitamin (MULTIVITAMIN) tablet Take 1  tablet by mouth daily.     . pantoprazole (PROTONIX) 40 MG tablet Take 1 tablet (40 mg total) by mouth daily. 90 tablet 1  . potassium chloride (K-DUR,KLOR-CON) 10 MEQ tablet Take 1 tablet (10 mEq total) by mouth daily. (Patient taking differently: Take 10 mEq by mouth every other day. ) 90 tablet 1  . senna-docusate (SENOKOT-S) 8.6-50 MG per tablet Take 1 tablet by mouth at bedtime.    . simvastatin (ZOCOR) 20 MG tablet TAKE 1/2 TABLET  (10MG  TOTAL) EVERY EVENING (Patient taking differently: Take 5 mg by mouth every evening. ) 45 tablet 1  . vitamin B-12 (CYANOCOBALAMIN) 1000 MCG tablet Take 1 tablet (1,000 mcg total) by mouth daily. 30 tablet 0  . warfarin (COUMADIN) 5  MG tablet Take as directed by anti-coagulation clinic. (Patient taking differently: Take 5 mg by mouth daily at 6 PM. Take 1 tablet 10m all days except on Monday and Friday take 7.527m 120 tablet 0  . Cyanocobalamin 1000 MCG/ML KIT Inject 1 mL as directed every 8 (eight) weeks. (Patient not taking: Reported on 03/11/2016) 1 kit 0  . fluticasone (FLONASE) 50 MCG/ACT nasal spray Place 2 sprays into both nostrils daily. (Patient taking differently: Place 2 sprays into both nostrils as needed. ) 16 g 11    Home: Home Living Family/patient expects to be discharged to:: Unsure Additional Comments: pt either going to [facility]: Prior Function Level of Independence: Independent with assistive device(s) Comments: Uses rollator  Functional Status:  Mobility: Bed Mobility Overal bed mobility: Needs Assistance Bed Mobility: Supine to Sit, Rolling Rolling: Mod assist Supine to sit: Mod assist General bed mobility comments: pt sitting EOB with OT upon PT arrival Transfers Overall transfer level: Needs assistance Equipment used: Rolling walker (2 wheeled) Transfers: Sit to/from Stand Sit to Stand: +2 physical assistance, Min assist General transfer comment: assist for anterior transition,  despite flexed posture pt with posterior lean and increased fear of falling Ambulation/Gait Ambulation/Gait assistance: Min assist, Mod assist, +2 physical assistance, +2 safety/equipment Ambulation Distance (Feet): 15 Feet (x2 (to/from bathroom)) Assistive device: Rolling walker (2 wheeled) Gait Pattern/deviations: WFL(Within Functional Limits), Decreased stride length, Shuffle (decreased step heigth) General Gait Details: pt with increased fear of falling, noted bilat knee instability with onset of fatigue progressing to pt requiring mod/maXAx2 to make it to the chair Gait velocity: slow Gait velocity interpretation: Below normal speed for age/gender    ADL: ADL Overall ADL's : Needs assistance/impaired Grooming: Wash/dry face, Set up, Sitting Lower Body Dressing: Total assistance Toilet Transfer: +2 for physical assistance, Minimal assistance, Ambulation, BSC, RW Toilet Transfer Details (indicate cue type and reason): cues to reach for 3n1. Pt holding RW and descending to toilet uncontrolled and still holding RW Toileting- Clothing Manipulation and Hygiene: Minimal assistance, Sitting/lateral lean Toileting - Clothing Manipulation Details (indicate cue type and reason): able to complete peri care in sitting Functional mobility during ADLs: +2 for physical assistance, Minimal assistance, Rolling walker General ADL Comments: pt required total (A) to don brace. pt unable to recall any precautions. pt with eyes closed at times during session. pt with fatigue and incr in pain. pt reports pain at 5 out 10 sitting and 8 out 10 after sitting on toilet/ transfer to recliner. Pt reports pain R LE radiationg to R knee. Pt with L Ue iv bleeding. RN Shaun in room to address x2 during session.   Cognition: Cognition Overall Cognitive Status: Impaired/Different from baseline Orientation Level: Oriented X4 Cognition Arousal/Alertness: Awake/alert Behavior During Therapy: WFL for tasks  assessed/performed Overall Cognitive Status: Impaired/Different from baseline Area of Impairment: Attention, Safety/judgement Current Attention Level: Sustained Safety/Judgement: Decreased awareness of safety, Decreased awareness of deficits General Comments: daughter states "she seems very confused." Pt closing eyes several times during session but when asked states "i do that" pt slow processing and needs incr time. Hard to determine incr time to hear vs cognitive delays  Blood pressure 101/44, pulse 90, temperature 97.9 F (36.6 C), temperature source Oral, resp. rate 20, height 5' 7"  (1.702 m), weight 56.7 kg (125 lb), SpO2 100 %. Physical Exam  HENT:  Head: Normocephalic.  Eyes: EOM are normal.  Neck: Normal range of motion. Neck supple. No thyromegaly present.  Cardiovascular:  Cardiac rate controlled  Respiratory: Effort normal and breath sounds normal. No respiratory distress.  GI: Soft. Bowel sounds are normal. She exhibits no distension.  Neurological: She is alert.  Patient is a bit lethargic but arousable. She is oriented to person place and time.  Skin:  Back incision clean and dry with dressing in place  lumbar drain Motor strength is 4 minus  Bilateral upper limbs, 3 minus bilateral hip flexion as well as right knee extension and right ankle dorsiflexor 4/5 in the left knee extensor  And left  Ankle dorsiflexor Sensation intact to light touch in bilateral upper and lower limbs   Results for orders placed or performed during the hospital encounter of 03/23/16 (from the past 24 hour(s))  CBC with Differential/Platelet     Status: Abnormal   Collection Time: 03/24/16  9:56 AM  Result Value Ref Range   WBC 10.5 4.0 - 10.5 K/uL   RBC 2.52 (L) 3.87 - 5.11 MIL/uL   Hemoglobin 8.0 (L) 12.0 - 15.0 g/dL   HCT 25.1 (L) 36.0 - 46.0 %   MCV 99.6 78.0 - 100.0 fL   MCH 31.7 26.0 - 34.0 pg   MCHC 31.9 30.0 - 36.0 g/dL   RDW 13.2 11.5 - 15.5 %   Platelets 84 (L) 150 - 400 K/uL     Neutrophils Relative % 74 %   Neutro Abs 7.8 (H) 1.7 - 7.7 K/uL   Lymphocytes Relative 11 %   Lymphs Abs 1.1 0.7 - 4.0 K/uL   Monocytes Relative 15 %   Monocytes Absolute 1.6 (H) 0.1 - 1.0 K/uL   Eosinophils Relative 0 %   Eosinophils Absolute 0.0 0.0 - 0.7 K/uL   Basophils Relative 0 %   Basophils Absolute 0.0 0.0 - 0.1 K/uL  CBC     Status: Abnormal   Collection Time: 03/24/16  6:40 PM  Result Value Ref Range   WBC 9.4 4.0 - 10.5 K/uL   RBC 2.47 (L) 3.87 - 5.11 MIL/uL   Hemoglobin 7.8 (L) 12.0 - 15.0 g/dL   HCT 24.6 (L) 36.0 - 46.0 %   MCV 99.6 78.0 - 100.0 fL   MCH 31.6 26.0 - 34.0 pg   MCHC 31.7 30.0 - 36.0 g/dL   RDW 13.2 11.5 - 15.5 %   Platelets 77 (L) 150 - 400 K/uL  CBC     Status: Abnormal   Collection Time: 03/25/16  4:22 AM  Result Value Ref Range   WBC 8.3 4.0 - 10.5 K/uL   RBC 2.30 (L) 3.87 - 5.11 MIL/uL   Hemoglobin 7.2 (L) 12.0 - 15.0 g/dL   HCT 23.0 (L) 36.0 - 46.0 %   MCV 100.0 78.0 - 100.0 fL   MCH 31.3 26.0 - 34.0 pg   MCHC 31.3 30.0 - 36.0 g/dL   RDW 13.3 11.5 - 15.5 %   Platelets 71 (L) 150 - 400 K/uL  Prepare RBC     Status: None   Collection Time: 03/25/16  9:03 AM  Result Value Ref Range   Order Confirmation ORDER PROCESSED BY BLOOD BANK    Dg Lumbar Spine 2-3 Views  03/23/2016  CLINICAL DATA:  L3-4 laminectomy. EXAM: LUMBAR SPINE - 2-3 VIEW COMPARISON:  MRI of January 02, 2016. FINDINGS: Two intraoperative cross-table lateral projections were obtained. The first demonstrates surgical probes in the soft tissues posterior to L4. The second demonstrates surgical probes directed toward the posterior margin of the L3-4 disc space. IMPRESSION: Surgical localization as described  above. Electronically Signed   By: Marijo Conception, M.D.   On: 03/23/2016 15:48   Dg Chest Port 1 View  03/24/2016  CLINICAL DATA:  Tachypnea. Lumbar spine surgery performed the previous day. EXAM: PORTABLE CHEST 1 VIEW COMPARISON:  Chest radiograph 09/29/2015 FINDINGS:  Cardiomediastinal silhouette remains enlarged. There is calcification of the aortic arch. There is consolidative opacity at the left lung base. The right lung is clear without pleural effusion. No pneumothorax is visualized on this semi erect chest radiograph. There is mild leftward shift of the mediastinal structures secondary to left hemithoracic volume loss. No significant pulmonary edema. Tubing overlies the left lower chest, possibly outside the patient. Lower thoracic vertebral augmentation cement is again seen. IMPRESSION: 1. Left lung base consolidation with associated left hemithorax volume loss, likely indicating postoperative atelectasis. Pneumonia is considered less likely. 2. Unchanged cardiomegaly. 3. Aortic atherosclerosis. Electronically Signed   By: Ulyses Jarred M.D.   On: 03/24/2016 20:16   Dg Spine Portable 1 View  03/23/2016  ADDENDUM REPORT: 03/23/2016 15:22 ADDENDUM: These results were called by telephone at the time of interpretation on 03/23/2016 at the time of interpretation to Dr. Leeroy Cha , who verbally acknowledged these results. Electronically Signed   By: Inge Rise M.D.   On: 03/23/2016 15:22  03/23/2016  CLINICAL DATA:  L3-4 laminectomy and foraminotomy. EXAM: PORTABLE SPINE - 1 VIEW COMPARISON:  MRI lumbar spine 01/02/2016. FINDINGS: Two metallic probes are in place. The more superior is directed toward the level of the L2-3 disc interspace. The more inferior probe projects over the L4 pedicles. Remote L4 compression fracture is identified. IMPRESSION: Localization as above. Electronically Signed: By: Inge Rise M.D. On: 03/23/2016 15:13    Assessment/Plan: Diagnosis: Right lumbar radiculopathy status post L3-L4 laminectomy and foraminotomies postoperative day #2 1. Does the need for close, 24 hr/day medical supervision in concert with the patient's rehab needs make it unreasonable for this patient to be served in a less intensive setting?  Potentially 2. Co-Morbidities requiring supervision/potential complications: Acute blood loss anemia, chronic atrial fibrillation normally on warfarin, Congestive heart failure 3. Due to bladder management, bowel management, safety, skin/wound care, disease management, medication administration, pain management and patient education, does the patient require 24 hr/day rehab nursing? Yes 4. Does the patient require coordinated care of a physician, rehab nurse, PT, OT to address physical and functional deficits in the context of the above medical diagnosis(es)? Potentially Addressing deficits in the following areas: balance, endurance, locomotion, strength, transferring, bowel/bladder control, bathing, dressing, feeding, grooming and toileting 5. Can the patient actively participate in an intensive therapy program of at least 3 hrs of therapy per day at least 5 days per week? No 6. The potential for patient to make measurable gains while on inpatient rehab is poor 7. Anticipated functional outcomes upon discharge from inpatient rehab are n/a  with PT, n/a with OT, n/a with SLP. 8. Estimated rehab length of stay to reach the above functional goals is: Not applicable 9. Does the patient have adequate social supports and living environment to accommodate these discharge functional goals? N/A 10. Anticipated D/C setting: Other 11. Anticipated post D/C treatments: N/A 12. Overall Rehab/Functional Prognosis: fair  RECOMMENDATIONS: This patient's condition is appropriate for continued rehabilitative care in the following setting: SNF Patient has agreed to participate in recommended program. N/A Note that insurance prior authorization may be required for reimbursement for recommended care.  Comment: Patient currently unable to tolerate comprehensive intensive inpatient rib rotation program. If  her lethargy improves after blood transfusion, may need to reassess this. Discussed with the patient's  daughter    03/25/2016

## 2016-03-25 NOTE — Progress Notes (Signed)
Physical Therapy Treatment Patient Details Name: [name] MRN: [id number] DOB: 11-28-1921 Today's Date: 03/25/2016    History of Present Illness 82 yo female s/p L3-4 laminectomy. PMH: CAD, GERD, diverticulosis, arthritis, anemnia, HTn, Osteoporosis, OA, LBBB, COPD .  Post op ABL anemia s/p 2 units of PRBCs.      PT Comments    Pt was able to get OOB to [facility] and walk a short distance to the recliner chair with RW and two person assist.  She continues to be appropriate for SNF level rehab at discharge.    Follow Up Recommendations  SNF     Equipment Recommendations  Rolling walker with 5" wheels    Recommendations for Other Services   NA     Precautions / Restrictions Precautions Precautions: Back;Fall Precaution Booklet Issued: Yes (comment) Precaution Comments: provided back handout (again) and educated on back precautions to pt and daughter and grandaughter.  Required Braces or Orthoses: Spinal Brace Spinal Brace: Lumbar corset;Applied in sitting position    Mobility  Bed Mobility Overal bed mobility: Needs Assistance Bed Mobility: Rolling;Sidelying to Sit Rolling: Mod assist Sidelying to sit: Mod assist;+2 for physical assistance       General bed mobility comments: Mod assist to roll to her side, verbal and tactile cues for log roll technique.  Assist needed at trunk and legs to get to side lying, pt pulling on bed rail.  Two person assist needed to power up to sitting.    Transfers Overall transfer level: Needs assistance Equipment used: Rolling walker (2 wheeled) Transfers: Sit to/from [facility] Sit to Stand: +2 physical assistance;Mod assist Stand pivot transfers: +2 physical assistance;Max assist (third person used )       General transfer comment: Assist needed to power up over weak and flexed knees, verbal cues for safe hand placement.  Two person support at trunk, one more to boost up hips and ensure while we turned that pt made it  to [facility] target before collapsing onto the [facility]. Second stand from [facility] much improved, but continued need for cues for hand pplacement and to push through legs to get to fully upright standing.   Ambulation/Gait Ambulation/Gait assistance: +2 physical assistance;Mod assist Ambulation Distance (Feet): 5 Feet Assistive device: Rolling walker (2 wheeled) Gait Pattern/deviations: Step-through pattern;Shuffle;Trunk flexed     General Gait Details: Two person mod assist to support trunk over weak, buckling, and flexed legs.  Verbal cues for extended hips and knees, upright posture.  Manual assist needed to steer RW and max verbal cues for safe transition to recliner chair (to back all the way up until she feels the chair and reach back before sitting down).                       Balance Overall balance assessment: Needs assistance Sitting-balance support: Feet supported;Bilateral upper extremity supported Sitting balance-Leahy Scale: Poor   Postural control: Posterior lean Standing balance support: Bilateral upper extremity supported Standing balance-Leahy Scale: Poor                      Cognition Arousal/Alertness: Awake/alert Behavior During Therapy: WFL for tasks assessed/performed Overall Cognitive Status: Impaired/Different from baseline Area of Impairment: Attention;Safety/judgement;Awareness;Problem solving   Current Attention Level: Sustained Memory: Decreased recall of precautions;Decreased short-term memory   Safety/Judgement: Decreased awareness of safety;Decreased awareness of deficits Awareness: Intellectual Problem Solving: Difficulty sequencing;Requires verbal cues;Requires tactile cues General Comments: Pt more clear today, however, still needs  step by step verbal and tactile cues, could not remember walking to bathroom with therapy before.            Pertinent Vitals/Pain Pain Assessment: Faces Faces Pain Scale: Hurts even more Pain Location:  back Pain Descriptors / Indicators: Grimacing;Guarding Pain Intervention(s): Limited activity within patient's tolerance;Monitored during session;Repositioned           PT Goals (current goals can now be found in the care plan section) Acute Rehab PT Goals Patient Stated Goal: Daughter wants her to rehab before going home.  Progress towards PT goals: Progressing toward goals    Frequency  Min 5X/week    PT Plan Current plan remains appropriate       End of Session Equipment Utilized During Treatment: Gait belt;Back brace Activity Tolerance: Patient limited by fatigue;Patient limited by pain Patient left: in chair;with call bell/phone within reach;with chair alarm set;with family/visitor present     Time: 1638-1730 (minus one unit due to excessive time on the [facility]) PT Time Calculation (min) (ACUTE ONLY): 52 min  Charges:  $Therapeutic Activity: 23-37 mins                      Kurt Azimi B. [place], [place], DPT [phone]   03/25/2016, 9:49 PM

## 2016-03-25 NOTE — Progress Notes (Signed)
Patient ID: [name], female   DOB: 11-25-1921, 82 y.o.   MRN: [id number] Much better. oob

## 2016-03-25 NOTE — Care Management Note (Signed)
Case Management Note  Patient Details  Name: [name] MRN: [id number] Date of Birth: Feb 17, 1922  Subjective/Objective:     Pt underwent:Bilateral L3-L4 laminectomy, foraminotomy, decompression of the thecal sac as well as the L3 and L4 nerve root. Partial laminotomy of L5. Posterolateral arthrodesis with autograft and Vitoss.   She is from [place].          Action/Plan: PT/OT recommendations are for SNF. Pt to receive 2 units PRC's today. CM following for discharge needs.  Expected Discharge Date:                  Expected Discharge Plan:  [place]  In-House Referral:     Discharge planning Services     Post Acute Care Choice:    Choice offered to:     DME Arranged:    DME Agency:     HH Arranged:    [place] Agency:     Status of Service:  In process, will continue to follow  If discussed at Long Length of Stay Meetings, dates discussed:    Additional Comments:  Pollie Friar, RN 03/25/2016, 11:09 AM

## 2016-03-25 NOTE — Clinical Social Work Note (Signed)
Clinical Social Work Assessment  Patient Details  Name: [name] MRN: [id number] Date of Birth: September 26, 1921  Date of referral:  03/25/16               Reason for consult:  Facility Placement, Discharge Planning                Permission sought to share information with:  Family Supports, Customer service manager, Case Optician, dispensing granted to share information::  Yes, Verbal Permission Granted  Name::      Pharmacist, hospital)  Agency::   (SNF's in [place] area)  Relationship::   (Daughter )  Contact Information:   [phone])  Housing/Transportation Living arrangements for the past 2 months:  [place] of Information:  Patient Patient Interpreter Needed:  None Criminal Activity/Legal Involvement Pertinent to Current Situation/Hospitalization:  No - Comment as needed Significant Relationships:  Adult Children Lives with:  Adult Children Do you feel safe going back to the place where you live?  No Need for family participation in patient care:  Yes (Comment)  Care giving concerns:  PT currently recommending SNF placement for ST rehab.    Social Worker assessment / plan:  Holiday representative spoke with patient's dtr, [name] in reference to post-acute placement for SNF. CSW introduced CSW role and SNF process. CSW also reviewed SNF list. Pt's dtr reported that MD wishes for CIR/IP REHAB once medically stable. Rehab consult placed on 7/13. CSW explained PT's rec for SNF. Pt's dtr agreeable to SNF placement in the event patient cannot be admitted into IP REHAB. Pt's dtr first choice is [facility] and second, [facility]. CSW will continue to follow pt and pt's family for continued support and to facilitate d/c needs once stable.   Employment status:  Retired Nurse, adult PT Recommendations:  [place] / Referral to community resources:  [place]  Patient/Family's  Response to care:  Pt a/o. Pt and dtr agreeable to to CIR vs SNF. Dtr pleasant and appreciated social work intervention.   Patient/Family's Understanding of and Emotional Response to Diagnosis, Current Treatment, and Prognosis: Pt's dtr knowledgeable of patient receiving 2 units of blood and continued medical work up.   Emotional Assessment Appearance:  Appears stated age Attitude/Demeanor/Rapport:  Unable to Assess Affect (typically observed):  Unable to Assess Orientation:  Oriented to Self, Oriented to Place, Oriented to  Time Alcohol / Substance use:  Not Applicable Psych involvement (Current and /or in the community):  No (Comment)  Discharge Needs  Concerns to be addressed:  Care Coordination Readmission within the last 30 days:  No Current discharge risk:  Dependent with Mobility Barriers to Discharge:  Continued Medical Work up   [employer], MSW, LCSWA [phone] 03/25/2016 12:12 PM

## 2016-03-26 DIAGNOSIS — I10 Essential (primary) hypertension: Secondary | ICD-10-CM | POA: Diagnosis not present

## 2016-03-26 DIAGNOSIS — I959 Hypotension, unspecified: Secondary | ICD-10-CM | POA: Diagnosis not present

## 2016-03-26 DIAGNOSIS — I255 Ischemic cardiomyopathy: Secondary | ICD-10-CM | POA: Diagnosis not present

## 2016-03-26 DIAGNOSIS — I5023 Acute on chronic systolic (congestive) heart failure: Secondary | ICD-10-CM | POA: Diagnosis not present

## 2016-03-26 DIAGNOSIS — N390 Urinary tract infection, site not specified: Secondary | ICD-10-CM | POA: Diagnosis not present

## 2016-03-26 DIAGNOSIS — D62 Acute posthemorrhagic anemia: Secondary | ICD-10-CM | POA: Diagnosis not present

## 2016-03-26 DIAGNOSIS — I482 Chronic atrial fibrillation: Secondary | ICD-10-CM | POA: Diagnosis not present

## 2016-03-26 DIAGNOSIS — I5022 Chronic systolic (congestive) heart failure: Secondary | ICD-10-CM | POA: Diagnosis not present

## 2016-03-26 LAB — TYPE AND SCREEN
ABO/RH(D): A NEG
ANTIBODY SCREEN: NEGATIVE
UNIT DIVISION: 0
Unit division: 0

## 2016-03-26 LAB — BASIC METABOLIC PANEL
Anion gap: 6 (ref 5–15)
BUN: 24 mg/dL — ABNORMAL HIGH (ref 6–20)
CALCIUM: 8.4 mg/dL — AB (ref 8.9–10.3)
CHLORIDE: 105 mmol/L (ref 101–111)
CO2: 24 mmol/L (ref 22–32)
CREATININE: 1.1 mg/dL — AB (ref 0.44–1.00)
GFR calc non Af Amer: 42 mL/min — ABNORMAL LOW (ref >60)
GFR, EST AFRICAN AMERICAN: 49 mL/min — AB (ref >60)
Glucose, Bld: 105 mg/dL — ABNORMAL HIGH (ref 65–99)
Potassium: 4.2 mmol/L (ref 3.5–5.1)
SODIUM: 135 mmol/L (ref 135–145)

## 2016-03-26 LAB — CBC
HCT: 28.4 % — ABNORMAL LOW (ref 36.0–46.0)
HEMOGLOBIN: 9.2 g/dL — AB (ref 12.0–15.0)
MCH: 31.3 pg (ref 26.0–34.0)
MCHC: 32.4 g/dL (ref 30.0–36.0)
MCV: 96.6 fL (ref 78.0–100.0)
Platelets: 70 10*3/uL — ABNORMAL LOW (ref 150–400)
RBC: 2.94 MIL/uL — ABNORMAL LOW (ref 3.87–5.11)
RDW: 15.4 % (ref 11.5–15.5)
WBC: 8.2 10*3/uL (ref 4.0–10.5)

## 2016-03-26 NOTE — Progress Notes (Signed)
Physical Therapy Treatment Patient Details Name: [name] MRN: [id number] DOB: 04/08/1922 Today's Date: 03/26/2016    History of Present Illness 82 yo female s/p L3-4 laminectomy. PMH: CAD, GERD, diverticulosis, arthritis, anemnia, HTn, Osteoporosis, OA, LBBB, COPD .  Post op ABL anemia s/p 2 units of PRBCs.      PT Comments    Patient currently mod/max A +2 for OOB mobility. Pt with difficulty recalling precautions. Continue to progress as tolerated with anticipated d/c to SNF for further skilled PT services.    Follow Up Recommendations  SNF     Equipment Recommendations  Rolling walker with 5" wheels    Recommendations for Other Services       Precautions / Restrictions Precautions Precautions: Back;Fall Precaution Booklet Issued: Yes (comment) Precaution Comments: reviewed precautions; pt able to state 1/3 without cues Required Braces or Orthoses: Spinal Brace Spinal Brace: Lumbar corset;Applied in sitting position Restrictions Weight Bearing Restrictions: No    Mobility  Bed Mobility Overal bed mobility: Needs Assistance Bed Mobility: Rolling;Sidelying to Sit Rolling: Mod assist Sidelying to sit: Mod assist;+2 for physical assistance       General bed mobility comments: assist at [place] and trunk for log roll technique with verbal and tactile cues for hand placement, sequencing and technique; use of bedrail and HOB slightly elevated  Transfers Overall transfer level: Needs assistance Equipment used: Rolling walker (2 wheeled) Transfers: Sit to/from Stand Sit to Stand: Min assist;Mod assist;+2 physical assistance Stand pivot transfers: Max assist;+2 physical assistance (third person used )       General transfer comment: assist to power up into standing with mod A +2 first trial and min A +2 second trial; max A +2 for stand pivot with assist to pivot bilat feet, weight shift and manage RW; cues for technique and  posture  Ambulation/Gait Ambulation/Gait assistance: Mod assist;+2 physical assistance Ambulation Distance (Feet): 12 Feet Assistive device: Rolling walker (2 wheeled) Gait Pattern/deviations: Step-through pattern;Shuffle;Trunk flexed;Narrow base of support Gait velocity: slow   General Gait Details: assist to maintain balance, management of RW, and advancing bilat feet; pt with shuffling gait and difficulty weight shifting with flexed trunk; multimodal cues for posture and forward gaze   Stairs            Wheelchair Mobility    Modified Rankin (Stroke Patients Only)       Balance     Sitting balance-Leahy Scale: Poor       Standing balance-Leahy Scale: Poor                      Cognition Arousal/Alertness: Awake/alert Behavior During Therapy: WFL for tasks assessed/performed Overall Cognitive Status: Impaired/Different from baseline       Memory: Decreased recall of precautions       Problem Solving: Requires verbal cues;Requires tactile cues General Comments: Pt more clear today, however, still needs step by step verbal and tactile cues, could not remember walking to bathroom with therapy before.     Exercises      General Comments General comments (skin integrity, edema, etc.): pt with diffiucly urinating and with urge to urinate frequently      Pertinent Vitals/Pain Pain Assessment: Faces Faces Pain Scale: Hurts whole lot Pain Location: back  Pain Descriptors / Indicators: Grimacing;Guarding;Sore Pain Intervention(s): Limited activity within patient's tolerance;Monitored during session;Repositioned    Home Living  Prior Function            PT Goals (current goals can now be found in the care plan section) Acute Rehab PT Goals Patient Stated Goal: Daughter wants her to rehab before going home.  Progress towards PT goals: Progressing toward goals    Frequency  Min 5X/week    PT Plan Current plan  remains appropriate    Co-evaluation             End of Session Equipment Utilized During Treatment: Gait belt;Back brace Activity Tolerance: Patient limited by fatigue;Patient limited by pain Patient left: in chair;with call bell/phone within reach;with chair alarm set;with family/visitor present     Time: [id number] PT Time Calculation (min) (ACUTE ONLY): 38 min  Charges:  $Gait Training: 8-22 mins $Therapeutic Activity: 23-37 mins                    G Codes:      [place] April, PTA Pager: [phone]   03/26/2016, 2:32 PM

## 2016-03-26 NOTE — Progress Notes (Signed)
DAILY PROGRESS NOTE  Subjective:  Still with considerable pain. Seen for rehab admission, but felt to be too weak. No chest pain. Transfused yesterday.  Objective:  Temp:  [97.6 F (36.4 C)-98.9 F (37.2 C)] 97.6 F (36.4 C) (07/14 0551) Pulse Rate:  [84-104] 94 (07/14 0551) Resp:  [16-20] 18 (07/14 0551) BP: (101-125)/(44-68) 111/63 mmHg (07/14 0551) SpO2:  [95 %-100 %] 95 % (07/14 0551) Weight change:   Intake/Output from previous day: 07/13 0701 - 07/14 0700 In: 50 [I.V.:50] Out: -   Intake/Output from this shift: Total I/O In: -  Out: 125 [Urine:125]  Medications: Current Facility-Administered Medications  Medication Dose Route Frequency Provider Last Rate Last Dose  . 0.9 %  sodium chloride infusion  250 mL Intravenous Continuous Leeroy Cha, MD      . acetaminophen (TYLENOL) tablet 650 mg  650 mg Oral Q4H PRN Leeroy Cha, MD   650 mg at 03/26/16 4944   Or  . acetaminophen (TYLENOL) suppository 650 mg  650 mg Rectal Q4H PRN Leeroy Cha, MD      . antiseptic oral rinse (CPC / CETYLPYRIDINIUM CHLORIDE 0.05%) solution 7 mL  7 mL Mouth Rinse BID Leeroy Cha, MD   7 mL at 03/25/16 2206  . diazepam (VALIUM) tablet 5 mg  5 mg Oral Q6H PRN Leeroy Cha, MD   5 mg at 03/24/16 1228  . ferrous sulfate tablet 325 mg  325 mg Oral Q lunch Leeroy Cha, MD   325 mg at 03/25/16 1148  . lactated ringers infusion   Intravenous Continuous Suzette Battiest, MD 10 mL/hr at 03/23/16 1100    . levothyroxine (SYNTHROID, LEVOTHROID) tablet 88 mcg  88 mcg Oral QAC breakfast Leeroy Cha, MD   88 mcg at 03/25/16 0824  . lisinopril (PRINIVIL,ZESTRIL) tablet 2.5 mg  2.5 mg Oral QHS Leeroy Cha, MD   2.5 mg at 03/24/16 2200  . menthol-cetylpyridinium (CEPACOL) lozenge 3 mg  1 lozenge Oral PRN Leeroy Cha, MD       Or  . phenol (CHLORASEPTIC) mouth spray 1 spray  1 spray Mouth/Throat PRN Leeroy Cha, MD      . metoprolol tartrate (LOPRESSOR) tablet 25 mg  25 mg Oral  BID Leeroy Cha, MD   25 mg at 03/24/16 2200  . midodrine (PROAMATINE) tablet 5 mg  5 mg Oral TID WC Radene Gunning, NP   5 mg at 03/25/16 1757  . multivitamin with minerals tablet 1 tablet  1 tablet Oral Daily Leeroy Cha, MD   1 tablet at 03/25/16 1005  . oxyCODONE-acetaminophen (PERCOCET/ROXICET) 5-325 MG per tablet 1-2 tablet  1-2 tablet Oral Q4H PRN Leeroy Cha, MD   2 tablet at 03/25/16 1831  . senna (SENOKOT) tablet 8.6 mg  1 tablet Oral BID Leeroy Cha, MD   8.6 mg at 03/25/16 2159  . simvastatin (ZOCOR) tablet 10 mg  10 mg Oral q1800 Leeroy Cha, MD   10 mg at 03/25/16 1756  . sodium chloride flush (NS) 0.9 % injection 3 mL  3 mL Intravenous Q12H Leeroy Cha, MD   3 mL at 03/25/16 2205  . sodium chloride flush (NS) 0.9 % injection 3 mL  3 mL Intravenous PRN Leeroy Cha, MD      . white petrolatum (VASELINE) gel   Topical PRN Leeroy Cha, MD   0.2 application at 96/75/91 2016    Physical Exam: General appearance: fatigued and no distress Lungs: diminished breath sounds RLL Heart: regular rate and rhythm Extremities:  extremities normal, atraumatic, no cyanosis or edema Neurologic: Mental status: Somnolent, but awakens easily and is conversive  Lab Results: Results for orders placed or performed during the hospital encounter of 03/23/16 (from the past 48 hour(s))  CBC with Differential/Platelet     Status: Abnormal   Collection Time: 03/24/16  9:56 AM  Result Value Ref Range   WBC 10.5 4.0 - 10.5 K/uL   RBC 2.52 (L) 3.87 - 5.11 MIL/uL   Hemoglobin 8.0 (L) 12.0 - 15.0 g/dL   HCT 25.1 (L) 36.0 - 46.0 %   MCV 99.6 78.0 - 100.0 fL   MCH 31.7 26.0 - 34.0 pg   MCHC 31.9 30.0 - 36.0 g/dL   RDW 13.2 11.5 - 15.5 %   Platelets 84 (L) 150 - 400 K/uL    Comment: SPECIMEN CHECKED FOR CLOTS REPEATED TO VERIFY PLATELET COUNT CONFIRMED BY SMEAR    Neutrophils Relative % 74 %   Neutro Abs 7.8 (H) 1.7 - 7.7 K/uL   Lymphocytes Relative 11 %   Lymphs Abs 1.1 0.7 - 4.0  K/uL   Monocytes Relative 15 %   Monocytes Absolute 1.6 (H) 0.1 - 1.0 K/uL   Eosinophils Relative 0 %   Eosinophils Absolute 0.0 0.0 - 0.7 K/uL   Basophils Relative 0 %   Basophils Absolute 0.0 0.0 - 0.1 K/uL  CBC     Status: Abnormal   Collection Time: 03/24/16  6:40 PM  Result Value Ref Range   WBC 9.4 4.0 - 10.5 K/uL   RBC 2.47 (L) 3.87 - 5.11 MIL/uL   Hemoglobin 7.8 (L) 12.0 - 15.0 g/dL   HCT 24.6 (L) 36.0 - 46.0 %   MCV 99.6 78.0 - 100.0 fL   MCH 31.6 26.0 - 34.0 pg   MCHC 31.7 30.0 - 36.0 g/dL   RDW 13.2 11.5 - 15.5 %   Platelets 77 (L) 150 - 400 K/uL    Comment: CONSISTENT WITH PREVIOUS RESULT  CBC     Status: Abnormal   Collection Time: 03/25/16  4:22 AM  Result Value Ref Range   WBC 8.3 4.0 - 10.5 K/uL   RBC 2.30 (L) 3.87 - 5.11 MIL/uL   Hemoglobin 7.2 (L) 12.0 - 15.0 g/dL   HCT 23.0 (L) 36.0 - 46.0 %   MCV 100.0 78.0 - 100.0 fL   MCH 31.3 26.0 - 34.0 pg   MCHC 31.3 30.0 - 36.0 g/dL   RDW 13.3 11.5 - 15.5 %   Platelets 71 (L) 150 - 400 K/uL    Comment: CONSISTENT WITH PREVIOUS RESULT  Prepare RBC     Status: None   Collection Time: 03/25/16  9:03 AM  Result Value Ref Range   Order Confirmation ORDER PROCESSED BY BLOOD BANK   Hemoglobin and hematocrit, blood     Status: Abnormal   Collection Time: 03/25/16  6:25 PM  Result Value Ref Range   Hemoglobin 10.1 (L) 12.0 - 15.0 g/dL    Comment: POST TRANSFUSION SPECIMEN   HCT 32.1 (L) 36.0 - 74.9 %  Basic metabolic panel     Status: Abnormal   Collection Time: 03/25/16  6:25 PM  Result Value Ref Range   Sodium 133 (L) 135 - 145 mmol/L   Potassium 4.1 3.5 - 5.1 mmol/L   Chloride 105 101 - 111 mmol/L   CO2 22 22 - 32 mmol/L   Glucose, Bld 146 (H) 65 - 99 mg/dL   BUN 21 (H) 6 - 20 mg/dL  Creatinine, Ser 1.19 (H) 0.44 - 1.00 mg/dL   Calcium 8.3 (L) 8.9 - 10.3 mg/dL   GFR calc non Af Amer 38 (L) >60 mL/min   GFR calc Af Amer 45 (L) >60 mL/min    Comment: (NOTE) The eGFR has been calculated using the CKD EPI  equation. This calculation has not been validated in all clinical situations. eGFR's persistently <60 mL/min signify possible Chronic Kidney Disease.    Anion gap 6 5 - 15  CBC     Status: Abnormal   Collection Time: 03/26/16  3:47 AM  Result Value Ref Range   WBC 8.2 4.0 - 10.5 K/uL   RBC 2.94 (L) 3.87 - 5.11 MIL/uL   Hemoglobin 9.2 (L) 12.0 - 15.0 g/dL   HCT 28.4 (L) 36.0 - 46.0 %   MCV 96.6 78.0 - 100.0 fL   MCH 31.3 26.0 - 34.0 pg   MCHC 32.4 30.0 - 36.0 g/dL   RDW 15.4 11.5 - 15.5 %   Platelets 70 (L) 150 - 400 K/uL    Comment: CONSISTENT WITH PREVIOUS RESULT  Basic metabolic panel     Status: Abnormal   Collection Time: 03/26/16  3:47 AM  Result Value Ref Range   Sodium 135 135 - 145 mmol/L   Potassium 4.2 3.5 - 5.1 mmol/L   Chloride 105 101 - 111 mmol/L   CO2 24 22 - 32 mmol/L   Glucose, Bld 105 (H) 65 - 99 mg/dL   BUN 24 (H) 6 - 20 mg/dL   Creatinine, Ser 1.10 (H) 0.44 - 1.00 mg/dL   Calcium 8.4 (L) 8.9 - 10.3 mg/dL   GFR calc non Af Amer 42 (L) >60 mL/min   GFR calc Af Amer 49 (L) >60 mL/min    Comment: (NOTE) The eGFR has been calculated using the CKD EPI equation. This calculation has not been validated in all clinical situations. eGFR's persistently <60 mL/min signify possible Chronic Kidney Disease.    Anion gap 6 5 - 15    Imaging: Dg Chest Port 1 View  03/24/2016  CLINICAL DATA:  Tachypnea. Lumbar spine surgery performed the previous day. EXAM: PORTABLE CHEST 1 VIEW COMPARISON:  Chest radiograph 09/29/2015 FINDINGS: Cardiomediastinal silhouette remains enlarged. There is calcification of the aortic arch. There is consolidative opacity at the left lung base. The right lung is clear without pleural effusion. No pneumothorax is visualized on this semi erect chest radiograph. There is mild leftward shift of the mediastinal structures secondary to left hemithoracic volume loss. No significant pulmonary edema. Tubing overlies the left lower chest, possibly outside  the patient. Lower thoracic vertebral augmentation cement is again seen. IMPRESSION: 1. Left lung base consolidation with associated left hemithorax volume loss, likely indicating postoperative atelectasis. Pneumonia is considered less likely. 2. Unchanged cardiomegaly. 3. Aortic atherosclerosis. Electronically Signed   By: Ulyses Jarred M.D.   On: 03/24/2016 20:16    Assessment:  Principal Problem:   Acute blood loss anemia Active Problems:   Essential hypertension   Cardiomyopathy, ischemic   Chronic atrial fibrillation (HCC)   Chronic systolic CHF (congestive heart failure) (HCC)   Lumbar stenosis with neurogenic claudication   Surgery, elective   Arterial hypotension   Plan:  1. Transfused yesterday, did not get lasix. Able to lay flat today, however, weight appears to be up (may not be quite 136). Continue to follow and would definitely give lasix if additional blood transfusions are necessary. Continue to hold anticoagulation - HR reasonably well controlled with a-fib.  When there is no further evidence of bleeding, would restart warfarin without bridging. Likely will need SNF given her debilitation.  Time Spent Directly with Patient:  15 minutes  Length of Stay:  LOS: 3 days   [name] Casino, MD, [facility] Attending Cardiologist [place] 03/26/2016, 8:26 AM

## 2016-03-26 NOTE — Progress Notes (Signed)
PROGRESS NOTE  [name]  [name] DOB: 07-07-1922  DOA: 03/23/2016 PCP: Loura Pardon, MD  Primary Cardiologist: Dr. Ida Rogue  Brief Narrative:  82 year old female patient with a PMH of chronic atrial fibrillation on Coumadin, cardiomyopathy with LVEF 30-35 percent, chronic combined systolic and diastolic CHF, CAD, MR, TR, prolonged QT, LBBB, carotid disease, HTN, chronic thrombocytopenia, hypothyroid, HLD, GERD and lumbar stenosis, underwent elective lumbar decompression of L3-4, L4-5 on 7/12 and postop course complicated by postop surgical site hematoma, acute blood loss anemia and hypotension. Hospitalists were consulted on 7/12 for assistance. Cardiology consulting as well.   Assessment & Plan:   Principal Problem:   Acute blood loss anemia Active Problems:   Essential hypertension   Cardiomyopathy, ischemic   Chronic atrial fibrillation (HCC)   Chronic systolic CHF (congestive heart failure) (HCC)   Lumbar stenosis with neurogenic claudication   Surgery, elective   Arterial hypotension   Lumbar stenosis with neurogenic claudication - Status post surgical decompression of L3-4, 4-5 on 7/12. - Still has her surgical site drain with some blood draining- probably decreased and plans for removing drain 7/14. Management per primary service. - Recommend minimizing opioid analgesics as much as possible to avoid sedation.  Postop hematoma/acute blood loss anemia - In the setting of Lovenox (now held). - Hemoglobin dropped from 11.9 g preop to 8 g postop and down to 7.2 g on 7/13 - Status post 2 units PRBC on 7/13. Hemoglobin up to 9.2. Follow CBC daily.  Chronic thrombocytopenia - Preop platelets 122 on 7/3. Platelet count stable in the 70 range for the last 3 days.  Essential hypertension - Controlled. Continue lisinopril and metoprolol.  Chronic atrial fibrillation - Patient was on Coumadin prior to surgery. - CHA2DS2 VASc score of 7. - Postop, was being  bridged with Lovenox and then developed postop hematoma with acute blood loss anemia. Lovenox held. - Restart anticoagulation when okay with neurosurgery and as per cardiology, may restart Coumadin without Lovenox/heparin bridging.  Chronic systolic CHF/ischemic cardiomyopathy - LVEF 30-35 percent. Seems compensated and euvolemic. Discussed with Cardiology, hold off on Lasix for now.  CAD - Asymptomatic of chest pain. Cardiology following.  Atelectasis  - Likely secondary to poor inspiratory effort related to pain and sedation. Minimize sedative medications and encourage out of bed and incentive spirometry - discussed extensively with patient's daughter at bedside and with RN.  Hypotension/orthostatic hypotension - Continue midodrine. Blood pressures have stabilized.  Hypothyroid. - Continue Synthroid.  Stage III chronic kidney disease/hyperkalemia - Creatinine normal at 1.10.   DVT prophylaxis: SCDs Code Status: DO NOT RESUSCITATE Family Communication: Discussed in detail with patient's daughter at bedside. Updated care and answered questions. Disposition Plan: To be determined. Not medically stable for discharge. As per primary service, DC to SNF early next week.   Consultants:   [facility] - are consultants  Cardiology   Procedures:   L3-4, L4-5 lumbar decompression surgery on 03/24/16.  Antimicrobials:   None.    Subjective: Alert and oriented this morning. Mild low back pain but seems better controlled. Seen eating breakfast this morning.  Objective:  Filed Vitals:   03/26/16 0155 03/26/16 0551 03/26/16 0800 03/26/16 1052  BP: 118/63 111/63  114/54  Pulse: 89 94  88  Temp: 98.8 F (37.1 C) 97.6 F (36.4 C)  98.3 F (36.8 C)  TempSrc: Axillary Oral  Oral  Resp: 18 18  17   Height:      Weight:   61.961 kg (136 lb 9.6  oz)   SpO2: 97% 95%  96%    Intake/Output Summary (Last 24 hours) at 03/26/16 1137 Last data filed at 03/26/16 0708  Gross per  24 hour  Intake      0 ml  Output    125 ml  Net   -125 ml   Filed Weights   03/23/16 1040 03/26/16 0800  Weight: 56.7 kg (125 lb) 61.961 kg (136 lb 9.6 oz)    Examination:  General exam: Elderly female, small built and frail, sitting up in bed eating breakfast by herself. Respiratory system: Reduced breath sounds in the bases. Rest of lung fields clear to auscultation. Poor inspiratory effort.  Cardiovascular system: S1 & S2 heard, irregularly irregular. No JVD, rubs, gallops or clicks. No pedal edema. Systolic ejection murmur grade 3 x 6 best heard at apex. Gastrointestinal system: Abdomen is nondistended, soft and nontender. No organomegaly or masses felt. Normal bowel sounds heard. Central nervous system: Alert and oriented. No focal neurological deficits. Extremities: Moves all limbs symmetrically Skin: No rashes, lesions or ulcers Psychiatry: Pleasant and appropriate.    Data Reviewed: I have personally reviewed following labs and imaging studies  CBC:  Recent Labs Lab 03/24/16 0956 03/24/16 1840 03/25/16 0422 03/25/16 1825 03/26/16 0347  WBC 10.5 9.4 8.3  --  8.2  NEUTROABS 7.8*  --   --   --   --   HGB 8.0* 7.8* 7.2* 10.1* 9.2*  HCT 25.1* 24.6* 23.0* 32.1* 28.4*  MCV 99.6 99.6 100.0  --  96.6  PLT 84* 77* 71*  --  70*   Basic Metabolic Panel:  Recent Labs Lab 03/25/16 1825 03/26/16 0347  NA 133* 135  K 4.1 4.2  CL 105 105  CO2 22 24  GLUCOSE 146* 105*  BUN 21* 24*  CREATININE 1.19* 1.10*  CALCIUM 8.3* 8.4*   GFR: Estimated Creatinine Clearance: 31.7 mL/min (by C-G formula based on Cr of 1.1). Liver Function Tests: No results for input(s): AST, ALT, ALKPHOS, BILITOT, PROT, ALBUMIN in the last 168 hours. No results for input(s): LIPASE, AMYLASE in the last 168 hours. No results for input(s): AMMONIA in the last 168 hours. Coagulation Profile:  Recent Labs Lab 03/23/16 1043  INR 1.15   Cardiac Enzymes: No results for input(s): CKTOTAL, CKMB,  CKMBINDEX, TROPONINI in the last 168 hours. BNP (last 3 results) No results for input(s): PROBNP in the last 8760 hours. HbA1C: No results for input(s): HGBA1C in the last 72 hours. CBG: No results for input(s): GLUCAP in the last 168 hours. Lipid Profile: No results for input(s): CHOL, HDL, LDLCALC, TRIG, CHOLHDL, LDLDIRECT in the last 72 hours. Thyroid Function Tests: No results for input(s): TSH, T4TOTAL, FREET4, T3FREE, THYROIDAB in the last 72 hours. Anemia Panel: No results for input(s): VITAMINB12, FOLATE, FERRITIN, TIBC, IRON, RETICCTPCT in the last 72 hours.  Sepsis Labs: No results for input(s): PROCALCITON, LATICACIDVEN in the last 168 hours.  No results found for this or any previous visit (from the past 240 hour(s)).       Radiology Studies: Dg Chest Port 1 View  03/24/2016  CLINICAL DATA:  Tachypnea. Lumbar spine surgery performed the previous day. EXAM: PORTABLE CHEST 1 VIEW COMPARISON:  Chest radiograph 09/29/2015 FINDINGS: Cardiomediastinal silhouette remains enlarged. There is calcification of the aortic arch. There is consolidative opacity at the left lung base. The right lung is clear without pleural effusion. No pneumothorax is visualized on this semi erect chest radiograph. There is mild leftward shift of the mediastinal  structures secondary to left hemithoracic volume loss. No significant pulmonary edema. Tubing overlies the left lower chest, possibly outside the patient. Lower thoracic vertebral augmentation cement is again seen. IMPRESSION: 1. Left lung base consolidation with associated left hemithorax volume loss, likely indicating postoperative atelectasis. Pneumonia is considered less likely. 2. Unchanged cardiomegaly. 3. Aortic atherosclerosis. Electronically Signed   By: Ulyses Jarred M.D.   On: 03/24/2016 20:16        Scheduled Meds: . antiseptic oral rinse  7 mL Mouth Rinse BID  . ferrous sulfate  325 mg Oral Q lunch  . levothyroxine  88 mcg Oral  QAC breakfast  . lisinopril  2.5 mg Oral QHS  . metoprolol tartrate  25 mg Oral BID  . midodrine  5 mg Oral TID WC  . multivitamin with minerals  1 tablet Oral Daily  . senna  1 tablet Oral BID  . simvastatin  10 mg Oral q1800  . sodium chloride flush  3 mL Intravenous Q12H   Continuous Infusions: . sodium chloride    . lactated ringers 10 mL/hr at 03/23/16 1100     LOS: 3 days    Time spent: 25 minutes.    [facility], MD [facility] Pager [phone]  If 7PM-7AM, please contact night-coverage [email or web address] Password [facility] 03/26/2016, 11:37 AM

## 2016-03-26 NOTE — Care Management Important Message (Signed)
Important Message  Patient Details  Name: [name] MRN: [id number] Date of Birth: August 02, 1922   Medicare Important Message Given:  Yes    [name] Abena 03/26/2016, 11:40 AM

## 2016-03-26 NOTE — Progress Notes (Signed)
Patient ID: [name], female   DOB: May 18, 1922, 82 y.o.   MRN: [id number] AWAKE,. NO WEAKNESS. GOT 2 UNITS OF RBC. LOOKING FOR A snf. Dc early next week

## 2016-03-27 DIAGNOSIS — I5022 Chronic systolic (congestive) heart failure: Secondary | ICD-10-CM | POA: Diagnosis not present

## 2016-03-27 DIAGNOSIS — I959 Hypotension, unspecified: Secondary | ICD-10-CM | POA: Diagnosis not present

## 2016-03-27 DIAGNOSIS — I255 Ischemic cardiomyopathy: Secondary | ICD-10-CM | POA: Diagnosis not present

## 2016-03-27 DIAGNOSIS — D62 Acute posthemorrhagic anemia: Secondary | ICD-10-CM | POA: Diagnosis not present

## 2016-03-27 DIAGNOSIS — I5023 Acute on chronic systolic (congestive) heart failure: Secondary | ICD-10-CM | POA: Diagnosis not present

## 2016-03-27 DIAGNOSIS — I10 Essential (primary) hypertension: Secondary | ICD-10-CM | POA: Diagnosis not present

## 2016-03-27 DIAGNOSIS — I482 Chronic atrial fibrillation: Secondary | ICD-10-CM | POA: Diagnosis not present

## 2016-03-27 DIAGNOSIS — N390 Urinary tract infection, site not specified: Secondary | ICD-10-CM | POA: Diagnosis not present

## 2016-03-27 LAB — CBC
HEMATOCRIT: 28.1 % — AB (ref 36.0–46.0)
HEMOGLOBIN: 9.3 g/dL — AB (ref 12.0–15.0)
MCH: 31.6 pg (ref 26.0–34.0)
MCHC: 33.1 g/dL (ref 30.0–36.0)
MCV: 95.6 fL (ref 78.0–100.0)
Platelets: 95 10*3/uL — ABNORMAL LOW (ref 150–400)
RBC: 2.94 MIL/uL — AB (ref 3.87–5.11)
RDW: 14.4 % (ref 11.5–15.5)
WBC: 8.4 10*3/uL (ref 4.0–10.5)

## 2016-03-27 MED ORDER — TRAMADOL HCL 50 MG PO TABS
50.0000 mg | ORAL_TABLET | Freq: Four times a day (QID) | ORAL | Status: DC | PRN
Start: 2016-03-27 — End: 2016-03-29
  Administered 2016-03-27 – 2016-03-29 (×5): 50 mg via ORAL
  Filled 2016-03-27 (×6): qty 1

## 2016-03-27 MED ORDER — POLYETHYLENE GLYCOL 3350 17 G PO PACK
17.0000 g | PACK | Freq: Every day | ORAL | Status: DC
  Administered 2016-03-27 – 2016-04-01 (×6): 17 g via ORAL
  Filled 2016-03-27 (×7): qty 1

## 2016-03-27 MED ORDER — DOCUSATE SODIUM 100 MG PO CAPS
100.0000 mg | ORAL_CAPSULE | Freq: Two times a day (BID) | ORAL | Status: DC
  Administered 2016-03-27 – 2016-04-02 (×12): 100 mg via ORAL
  Filled 2016-03-27 (×12): qty 1

## 2016-03-27 NOTE — Progress Notes (Signed)
PROGRESS NOTE  [name]  [name] DOB: 1922/01/12  DOA: 03/23/2016 PCP: Loura Pardon, MD  Primary Cardiologist: Dr. Ida Rogue  Brief Narrative:  82 year old female patient with a PMH of chronic atrial fibrillation on Coumadin, cardiomyopathy with LVEF 30-35 percent, chronic combined systolic and diastolic CHF, CAD, MR, TR, prolonged QT, LBBB, carotid disease, HTN, chronic thrombocytopenia, hypothyroid, HLD, GERD and lumbar stenosis, underwent elective lumbar decompression of L3-4, L4-5 on 7/12 and postop course complicated by postop surgical site hematoma, acute blood loss anemia and hypotension. Hospitalists were consulted on 7/12 for assistance. Cardiology consulting as well.   Assessment & Plan:   Principal Problem:   Acute blood loss anemia Active Problems:   Essential hypertension   Cardiomyopathy, ischemic   Chronic atrial fibrillation (HCC)   Chronic systolic CHF (congestive heart failure) (HCC)   Lumbar stenosis with neurogenic claudication   Surgery, elective   Arterial hypotension   Lumbar stenosis with neurogenic claudication - Status post surgical decompression of L3-4, 4-5 on 7/12. - Still has her surgical site drain with some blood draining- probably decreased and plans for removing drain 7/14. Management per primary service. - Recommend minimizing opioid analgesics as much as possible to avoid sedation.   Postop hematoma/acute blood loss anemia - In the setting of Lovenox (now held). - Hemoglobin dropped from 11.9 g preop to 8 g postop and down to 7.2 g on 7/13 - Status post 2 units PRBC on 7/13. Hemoglobin up to 9.2. Follow CBC daily.  Chronic thrombocytopenia - Preop platelets 122 on 7/3. Platelet count up to 90's  Essential hypertension - Controlled. Continue lisinopril and metoprolol.  Chronic atrial fibrillation - Patient was on Coumadin prior to surgery. - CHA2DS2 VASc score of 7. - Postop, was being bridged with Lovenox and then  developed postop hematoma with acute blood loss anemia. Lovenox held. - Restart anticoagulation when okay with neurosurgery and as per cardiology, may restart Coumadin without Lovenox/heparin bridging.  Chronic systolic CHF/ischemic cardiomyopathy - LVEF 30-35 percent. Seems compensated and euvolemic. Discussed with Cardiology, hold off on Lasix for now.  CAD - Asymptomatic of chest pain. Cardiology following.  Atelectasis  - Likely secondary to poor inspiratory effort related to pain and sedation. Minimize sedative medications and encourage out of bed and incentive spirometry - discussed extensively with patient's daughter at bedside and with RN.  Hypotension/orthostatic hypotension - Continue midodrine. Blood pressures have stabilized.  Hypothyroid. - Continue Synthroid.  Stage III chronic kidney disease/hyperkalemia - Creatinine normal at 1.10.   DVT prophylaxis: SCDs Code Status: DO NOT RESUSCITATE Family Communication: Discussed in detail with patient's daughter at bedside. Updated care and answered questions. Discussed about palliative care consultation in the future in case patient does not make sustained improvement or if she declines. Daughter was receptive. She is aware of hospice-husband was on it prior to demise in 2014 from metastatic cancer. Disposition Plan: To be determined. Not medically stable for discharge. As per primary service, DC to SNF early next week.   Consultants:   [facility] - are consultants  Cardiology   Procedures:   L3-4, L4-5 lumbar decompression surgery on 03/24/16.  Antimicrobials:   None.    Subjective: Somnolent this morning. Unable to provide much history. As per daughter at bedside, received pain pill this morning for back pain.  Objective:  Filed Vitals:   03/27/16 0500 03/27/16 0511 03/27/16 0950 03/27/16 1354  BP:  110/53 96/48 112/61  Pulse:  91 93 81  Temp:  98.8 F (37.1  C) 98.8 F (37.1 C) 99.7 F (37.6 C)    TempSrc:  Oral Oral Oral  Resp:  18 20 20   Height:      Weight: 63.504 kg (140 lb)     SpO2:  92% 95% 94%    Intake/Output Summary (Last 24 hours) at 03/27/16 1439 Last data filed at 03/27/16 0200  Gross per 24 hour  Intake      0 ml  Output    400 ml  Net   -400 ml   Filed Weights   03/23/16 1040 03/26/16 0800 03/27/16 0500  Weight: 56.7 kg (125 lb) 61.961 kg (136 lb 9.6 oz) 63.504 kg (140 lb)    Examination:  General exam: Elderly female, small built and frail. Somnolent this morning. Does not appear in distress. Respiratory system: Reduced breath sounds in the bases. Rest of lung fields clear to auscultation. Poor inspiratory effort.  Cardiovascular system: S1 & S2 heard, irregularly irregular. No JVD, rubs, gallops or clicks. No pedal edema. Systolic ejection murmur grade 3 x 6 best heard at apex. Gastrointestinal system: Abdomen is nondistended, soft and nontender. No organomegaly or masses felt. Normal bowel sounds heard. Central nervous system: Somnolent. No focal neurological deficits. Extremities: Moves all limbs symmetrically Skin: No rashes, lesions or ulcers Psychiatry: Pleasant and appropriate.    Data Reviewed: I have personally reviewed following labs and imaging studies  CBC:  Recent Labs Lab 03/24/16 0956 03/24/16 1840 03/25/16 0422 03/25/16 1825 03/26/16 0347 03/27/16 0448  WBC 10.5 9.4 8.3  --  8.2 8.4  NEUTROABS 7.8*  --   --   --   --   --   HGB 8.0* 7.8* 7.2* 10.1* 9.2* 9.3*  HCT 25.1* 24.6* 23.0* 32.1* 28.4* 28.1*  MCV 99.6 99.6 100.0  --  96.6 95.6  PLT 84* 77* 71*  --  70* 95*   Basic Metabolic Panel:  Recent Labs Lab 03/25/16 1825 03/26/16 0347  NA 133* 135  K 4.1 4.2  CL 105 105  CO2 22 24  GLUCOSE 146* 105*  BUN 21* 24*  CREATININE 1.19* 1.10*  CALCIUM 8.3* 8.4*   GFR: Estimated Creatinine Clearance: 31.7 mL/min (by C-G formula based on Cr of 1.1). Liver Function Tests: No results for input(s): AST, ALT, ALKPHOS,  BILITOT, PROT, ALBUMIN in the last 168 hours. No results for input(s): LIPASE, AMYLASE in the last 168 hours. No results for input(s): AMMONIA in the last 168 hours. Coagulation Profile:  Recent Labs Lab 03/23/16 1043  INR 1.15   Cardiac Enzymes: No results for input(s): CKTOTAL, CKMB, CKMBINDEX, TROPONINI in the last 168 hours. BNP (last 3 results) No results for input(s): PROBNP in the last 8760 hours. HbA1C: No results for input(s): HGBA1C in the last 72 hours. CBG: No results for input(s): GLUCAP in the last 168 hours. Lipid Profile: No results for input(s): CHOL, HDL, LDLCALC, TRIG, CHOLHDL, LDLDIRECT in the last 72 hours. Thyroid Function Tests: No results for input(s): TSH, T4TOTAL, FREET4, T3FREE, THYROIDAB in the last 72 hours. Anemia Panel: No results for input(s): VITAMINB12, FOLATE, FERRITIN, TIBC, IRON, RETICCTPCT in the last 72 hours.  Sepsis Labs: No results for input(s): PROCALCITON, LATICACIDVEN in the last 168 hours.  No results found for this or any previous visit (from the past 240 hour(s)).       Radiology Studies: No results found.      Scheduled Meds: . antiseptic oral rinse  7 mL Mouth Rinse BID  . docusate sodium  100 mg  Oral BID  . ferrous sulfate  325 mg Oral Q lunch  . levothyroxine  88 mcg Oral QAC breakfast  . lisinopril  2.5 mg Oral QHS  . metoprolol tartrate  25 mg Oral BID  . midodrine  5 mg Oral TID WC  . multivitamin with minerals  1 tablet Oral Daily  . polyethylene glycol  17 g Oral Daily  . senna  1 tablet Oral BID  . simvastatin  10 mg Oral q1800  . sodium chloride flush  3 mL Intravenous Q12H   Continuous Infusions: . sodium chloride    . lactated ringers 10 mL/hr at 03/23/16 1100     LOS: 4 days    Time spent: 25 minutes.    [facility], MD [facility] Pager [phone]  If 7PM-7AM, please contact night-coverage [email or web address] Password [facility] 03/27/2016, 2:39 PM

## 2016-03-27 NOTE — Progress Notes (Signed)
Physical Therapy Treatment Patient Details Name: [name] MRN: [id number] DOB: 14-Nov-1921 Today's Date: 03/27/2016    History of Present Illness 82 yo female s/p L3-4 laminectomy. PMH: CAD, GERD, diverticulosis, arthritis, anemnia, HTn, Osteoporosis, OA, LBBB, COPD .  Post op ABL anemia s/p 2 units of PRBCs.      PT Comments    Patient limited this session due to lethargy after narcotic pain medication.  She was able to participate with max encouragement and assist.  Agree with SNF level rehab at d/c.  Acute level PT to follow until d/c.   Follow Up Recommendations  SNF     Equipment Recommendations  Rolling walker with 5" wheels    Recommendations for Other Services       Precautions / Restrictions Precautions Precautions: Back;Fall Precaution Comments: cues for precautions Required Braces or Orthoses: Spinal Brace Spinal Brace: Lumbar corset;Applied in sitting position    Mobility  Bed Mobility Overal bed mobility: Needs Assistance Bed Mobility: Rolling;Sidelying to Sit;Sit to Sidelying Rolling: Mod assist Sidelying to sit: Mod assist;+2 for physical assistance     Sit to sidelying: +2 for safety/equipment;Mod assist General bed mobility comments: Patient participating with increased time, max cues and encouragement, but sleepy and needing assist for all aspects of mobility  Transfers Overall transfer level: Needs assistance Equipment used: Rolling walker (2 wheeled) Transfers: Sit to/from Stand Sit to Stand: Mod assist;+2 physical assistance         General transfer comment: lifting assist from EOB and cues for participation, repeated x 3 for strengthening, and due to unable to walk far today  Ambulation/Gait   Ambulation Distance (Feet): 6 Feet (3'forward, 3'backward) Assistive device: Rolling walker (2 wheeled) Gait Pattern/deviations: Step-to pattern;Decreased step length - left;Trunk flexed;Narrow base of support;Shuffle     General Gait  Details: stopped due to pt flexing over more and attempting to sit without surface behind her so cues for posture and to back up to bed, cues to keep eyes open   Stairs            Wheelchair Mobility    Modified Rankin (Stroke Patients Only)       Balance Overall balance assessment: Needs assistance Sitting-balance support: Feet supported Sitting balance-Leahy Scale: Fair Sitting balance - Comments: EOB with supervision   Standing balance support: Bilateral upper extremity supported Standing balance-Leahy Scale: Poor Standing balance comment: flexed posture, assist for balance, safety                    Cognition Arousal/Alertness: Lethargic Behavior During Therapy: Anxious   Area of Impairment: Attention;Following commands;Memory   Current Attention Level: Sustained Memory: Decreased recall of precautions;Decreased short-term memory   Safety/Judgement: Decreased awareness of deficits   Problem Solving: Slow processing;Decreased initiation;Requires verbal cues;Requires tactile cues;Difficulty sequencing General Comments: sleepy due to had Percocet early this am    Exercises      General Comments General comments (skin integrity, edema, etc.): daughter in room, reports she has been sleeping since had medication early this morning after getting up to [facility]; encouraged deep breathing with activity and performed 4 reps on incentive      Pertinent Vitals/Pain Faces Pain Scale: Hurts whole lot Pain Location: back with movement Pain Descriptors / Indicators: Guarding;Grimacing Pain Intervention(s): Limited activity within patient's tolerance;Monitored during session;Repositioned    Home Living                      Prior Function  PT Goals (current goals can now be found in the care plan section) Progress towards PT goals: Not progressing toward goals - comment (due to lethargy)    Frequency  Min 5X/week    PT Plan Current plan  remains appropriate    Co-evaluation             End of Session Equipment Utilized During Treatment: Gait belt;Back brace   Patient left: in bed;with call bell/phone within reach;with family/visitor present;with bed alarm set     Time: [id number] PT Time Calculation (min) (ACUTE ONLY): 21 min  Charges:  $Therapeutic Activity: 8-22 mins                    G Codes:      Reginia Naas April 08, 2016, 10:31 AM  Magda Kiel, [place] 2016-04-08

## 2016-03-27 NOTE — Progress Notes (Signed)
Overall stable. No new issues or problems. Still with back pain upon mobilization. Continue supportive efforts.

## 2016-03-27 NOTE — Progress Notes (Signed)
Pt having difficulty voiding. Patient assisted to the bedside commode x 2 but was unable to void after trying for several minutes.  Pt reports feeling the urge to void but not being able to. Bladder scan showed 500 cc of retained urine.  Straight cath performed and 400 cc of urine drained. Pt due to void by 0800.

## 2016-03-28 DIAGNOSIS — D62 Acute posthemorrhagic anemia: Secondary | ICD-10-CM | POA: Diagnosis not present

## 2016-03-28 DIAGNOSIS — I10 Essential (primary) hypertension: Secondary | ICD-10-CM | POA: Diagnosis not present

## 2016-03-28 DIAGNOSIS — I5022 Chronic systolic (congestive) heart failure: Secondary | ICD-10-CM | POA: Diagnosis not present

## 2016-03-28 DIAGNOSIS — I959 Hypotension, unspecified: Secondary | ICD-10-CM | POA: Diagnosis not present

## 2016-03-28 DIAGNOSIS — I5023 Acute on chronic systolic (congestive) heart failure: Secondary | ICD-10-CM | POA: Diagnosis not present

## 2016-03-28 DIAGNOSIS — N390 Urinary tract infection, site not specified: Secondary | ICD-10-CM | POA: Diagnosis not present

## 2016-03-28 DIAGNOSIS — I255 Ischemic cardiomyopathy: Secondary | ICD-10-CM | POA: Diagnosis not present

## 2016-03-28 DIAGNOSIS — I482 Chronic atrial fibrillation: Secondary | ICD-10-CM | POA: Diagnosis not present

## 2016-03-28 NOTE — Progress Notes (Signed)
PROGRESS NOTE  [name] JAGIELLO  [name] DOB: 1922/06/22  DOA: 03/23/2016 PCP: Loura Pardon, MD  Primary Cardiologist: Dr. Ida Rogue  Brief Narrative:  82 year old female patient with a PMH of chronic atrial fibrillation on Coumadin, cardiomyopathy with LVEF 30-35 percent, chronic combined systolic and diastolic CHF, CAD, MR, TR, prolonged QT, LBBB, carotid disease, HTN, chronic thrombocytopenia, hypothyroid, HLD, GERD and lumbar stenosis, underwent elective lumbar decompression of L3-4, L4-5 on 7/12 and postop course complicated by postop surgical site hematoma, acute blood loss anemia and hypotension. Hospitalists were consulted on 7/12 for assistance. Cardiology consulting as well.   Assessment & Plan:   Principal Problem:   Acute blood loss anemia Active Problems:   Essential hypertension   Cardiomyopathy, ischemic   Chronic atrial fibrillation (HCC)   Chronic systolic CHF (congestive heart failure) (HCC)   Lumbar stenosis with neurogenic claudication   Surgery, elective   Arterial hypotension   Lumbar stenosis with neurogenic claudication - Status post surgical decompression of L3-4, 4-5 on 7/12. - Still has her surgical site drain with some blood draining- probably decreased and plans for removing drain 7/14. Management per primary service. - Recommend minimizing opioid analgesics as much as possible to avoid sedation.   Postop hematoma/acute blood loss anemia - In the setting of Lovenox (now held). - Hemoglobin dropped from 11.9 g preop to 8 g postop and down to 7.2 g on 7/13 - Status post 2 units PRBC on 7/13. Hemoglobin up to 9.2. Follow CBC in a.m.  Chronic thrombocytopenia - Preop platelets 122 on 7/3. Platelet count up to 90's  Essential hypertension - Controlled. Continue lisinopril and metoprolol.  Chronic atrial fibrillation - Patient was on Coumadin prior to surgery. - CHA2DS2 VASc score of 7. - Postop, was being bridged with Lovenox and then  developed postop hematoma with acute blood loss anemia. Lovenox held. - Restart anticoagulation when okay with neurosurgery and as per cardiology, may restart Coumadin without Lovenox/heparin bridging.  Chronic systolic CHF/ischemic cardiomyopathy - LVEF 30-35 percent. Seems compensated and euvolemic. Discussed with Cardiology, hold off on Lasix for now. - Although her weight has steadily increased from 125 (03/23/16) >136 > 140 > 144 pounds. Clinically appears euvolemic or even on the dry side. Continue to encourage oral fluid intake and hold Lasix in the absence of dyspnea or overt heart failure.  CAD - Asymptomatic of chest pain. Cardiology following.  Atelectasis  - Likely secondary to poor inspiratory effort related to pain and sedation. Minimize sedative medications and encourage out of bed and incentive spirometry - discussed extensively with patient's daughter at bedside and with RN.  Hypotension/orthostatic hypotension - Continue midodrine. Blood pressures have stabilized.  Hypothyroid. - Continue Synthroid.  Stage III chronic kidney disease/hyperkalemia - Creatinine normal at 1.10.  Difficulty voiding/urinary retention - Patient has had difficulty voiding and had to have in and out catheterization done 3 times in the last 24 hours. Discussed with RN extensively. Mobilize out of bed to chair and may continue when necessary in and out cath. Trying to avoid indwelling Foley catheter. Minimize pain medications.   DVT prophylaxis: SCDs Code Status: DO NOT RESUSCITATE Family Communication: Discussed in detail with patient's daughter at bedside.  Disposition Plan: To be determined. Not medically stable for discharge. As per primary service, DC to SNF early next week.   Consultants:   [facility] - are consultants  Cardiology   Procedures:   L3-4, L4-5 lumbar decompression surgery on 03/24/16.  In and out urinary catheterization  Antimicrobials:  None.     Subjective: Alert this morning. States that she is doing okay. As per patient and daughter at bedside, poor oral intake. No difficulty breathing or cough reported.  Objective:  Filed Vitals:   03/28/16 0116 03/28/16 0456 03/28/16 0538 03/28/16 0940  BP: 121/68  135/82 119/69  Pulse: 74  94 110  Temp: 97.9 F (36.6 C)  98.2 F (36.8 C) 99.4 F (37.4 C)  TempSrc: Axillary  Oral Oral  Resp: 20  20 18   Height:      Weight:  65.318 kg (144 lb)    SpO2: 95%  96% 94%    Intake/Output Summary (Last 24 hours) at 03/28/16 1320 Last data filed at 03/28/16 0400  Gross per 24 hour  Intake    440 ml  Output    625 ml  Net   -185 ml   Filed Weights   03/26/16 0800 03/27/16 0500 03/28/16 0456  Weight: 61.961 kg (136 lb 9.6 oz) 63.504 kg (140 lb) 65.318 kg (144 lb)    Examination:  General exam: Elderly female, small built and frail. Looking much better than yesterday. Respiratory system: Reduced breath sounds in the bases. Rest of lung fields clear to auscultation. Poor inspiratory effort.  Cardiovascular system: S1 & S2 heard, irregularly irregular. No JVD, rubs, gallops or clicks. No pedal edema. Systolic ejection murmur grade 3 x 6 best heard at apex. Gastrointestinal system: Abdomen is nondistended, soft and nontender. No organomegaly or masses felt. Normal bowel sounds heard. Central nervous system: Alert and oriented 2. No focal neurological deficits. Extremities: Moves all limbs symmetrically Skin: No rashes, lesions or ulcers Psychiatry: Pleasant and appropriate.    Data Reviewed: I have personally reviewed following labs and imaging studies  CBC:  Recent Labs Lab 03/24/16 0956 03/24/16 1840 03/25/16 0422 03/25/16 1825 03/26/16 0347 03/27/16 0448  WBC 10.5 9.4 8.3  --  8.2 8.4  NEUTROABS 7.8*  --   --   --   --   --   HGB 8.0* 7.8* 7.2* 10.1* 9.2* 9.3*  HCT 25.1* 24.6* 23.0* 32.1* 28.4* 28.1*  MCV 99.6 99.6 100.0  --  96.6 95.6  PLT 84* 77* 71*  --  70*  95*   Basic Metabolic Panel:  Recent Labs Lab 03/25/16 1825 03/26/16 0347  NA 133* 135  K 4.1 4.2  CL 105 105  CO2 22 24  GLUCOSE 146* 105*  BUN 21* 24*  CREATININE 1.19* 1.10*  CALCIUM 8.3* 8.4*   GFR: Estimated Creatinine Clearance: 31.7 mL/min (by C-G formula based on Cr of 1.1). Liver Function Tests: No results for input(s): AST, ALT, ALKPHOS, BILITOT, PROT, ALBUMIN in the last 168 hours. No results for input(s): LIPASE, AMYLASE in the last 168 hours. No results for input(s): AMMONIA in the last 168 hours. Coagulation Profile:  Recent Labs Lab 03/23/16 1043  INR 1.15   Cardiac Enzymes: No results for input(s): CKTOTAL, CKMB, CKMBINDEX, TROPONINI in the last 168 hours. BNP (last 3 results) No results for input(s): PROBNP in the last 8760 hours. HbA1C: No results for input(s): HGBA1C in the last 72 hours. CBG: No results for input(s): GLUCAP in the last 168 hours. Lipid Profile: No results for input(s): CHOL, HDL, LDLCALC, TRIG, CHOLHDL, LDLDIRECT in the last 72 hours. Thyroid Function Tests: No results for input(s): TSH, T4TOTAL, FREET4, T3FREE, THYROIDAB in the last 72 hours. Anemia Panel: No results for input(s): VITAMINB12, FOLATE, FERRITIN, TIBC, IRON, RETICCTPCT in the last 72 hours.  Sepsis Labs: No  results for input(s): PROCALCITON, LATICACIDVEN in the last 168 hours.  No results found for this or any previous visit (from the past 240 hour(s)).       Radiology Studies: No results found.      Scheduled Meds: . antiseptic oral rinse  7 mL Mouth Rinse BID  . docusate sodium  100 mg Oral BID  . ferrous sulfate  325 mg Oral Q lunch  . levothyroxine  88 mcg Oral QAC breakfast  . lisinopril  2.5 mg Oral QHS  . metoprolol tartrate  25 mg Oral BID  . midodrine  5 mg Oral TID WC  . multivitamin with minerals  1 tablet Oral Daily  . polyethylene glycol  17 g Oral Daily  . senna  1 tablet Oral BID  . simvastatin  10 mg Oral q1800  . sodium  chloride flush  3 mL Intravenous Q12H   Continuous Infusions: . sodium chloride    . lactated ringers 10 mL/hr at 03/23/16 1100     LOS: 5 days    Time spent: 25 minutes.    [facility], MD [facility] Pager [phone]  If 7PM-7AM, please contact night-coverage [email or web address] Password [email or web address] 03/28/2016, 1:20 PM

## 2016-03-28 NOTE — Progress Notes (Signed)
No acute events Moving legs well Incision looks good Dispo planning

## 2016-03-28 NOTE — Progress Notes (Signed)
Physical Therapy Treatment Patient Details Name: [name] MRN: [id number] DOB: 01/22/1922 Today's Date: 03/28/2016    History of Present Illness 83 yo female s/p L3-4 laminectomy. PMH: CAD, GERD, diverticulosis, arthritis, anemnia, HTn, Osteoporosis, OA, LBBB, COPD .  Post op ABL anemia s/p 2 units of PRBCs.      PT Comments    Patient progressing with independence with bed mobility and transfers and able to walk further today with encouragement.  Feel she is a good candidate for SNF level rehab at d/c.   Follow Up Recommendations  SNF     Equipment Recommendations  Rolling walker with 5" wheels    Recommendations for Other Services       Precautions / Restrictions Precautions Precautions: Back;Fall Required Braces or Orthoses: Spinal Brace Spinal Brace: Lumbar corset;Applied in sitting position    Mobility  Bed Mobility Overal bed mobility: Needs Assistance     Sidelying to sit: Mod assist       General bed mobility comments: patient active with moving legs off bed, assist to lift trunk and scoot to EOB  Transfers Overall transfer level: Needs assistance Equipment used: Rolling walker (2 wheeled) Transfers: Sit to/from Stand Sit to Stand: Min assist;+2 physical assistance         General transfer comment: patient lifting up from EOB, assist for balance, posture, safety  Ambulation/Gait Ambulation/Gait assistance: Mod assist;Max assist;+2 safety/equipment Ambulation Distance (Feet): 25 Feet Assistive device: Rolling walker (2 wheeled) Gait Pattern/deviations: Step-through pattern;Decreased stride length;Trunk flexed;Narrow base of support;Shuffle     General Gait Details: cues/encouragement to continue walking past doorway.  Patient anxious and flexing over while walking, cues for eyes open, upright posture and step length   Stairs            Wheelchair Mobility    Modified Rankin (Stroke Patients Only)       Balance Overall balance  assessment: Needs assistance   Sitting balance-Leahy Scale: Fair Sitting balance - Comments: EOB with supervision   Standing balance support: Bilateral upper extremity supported Standing balance-Leahy Scale: Poor Standing balance comment: Heavy UE support and assist for balance                    Cognition Arousal/Alertness: Awake/alert Behavior During Therapy: Anxious Overall Cognitive Status: Impaired/Different from baseline Area of Impairment: Attention;Following commands;Memory   Current Attention Level: Sustained Memory: Decreased recall of precautions;Decreased short-term memory   Safety/Judgement: Decreased awareness of deficits   Problem Solving: Slow processing;Decreased initiation;Requires verbal cues;Requires tactile cues      Exercises      General Comments        Pertinent Vitals/Pain Faces Pain Scale: Hurts even more Pain Location: back with ambulation Pain Descriptors / Indicators: Grimacing;Guarding;Moaning Pain Intervention(s): Monitored during session;Repositioned    Home Living                      Prior Function            PT Goals (current goals can now be found in the care plan section) Progress towards PT goals: Progressing toward goals    Frequency  Min 5X/week    PT Plan Current plan remains appropriate    Co-evaluation             End of Session Equipment Utilized During Treatment: Gait belt;Back brace Activity Tolerance: Patient limited by fatigue Patient left: in chair;with call bell/phone within reach;with family/visitor present;with chair alarm set  Time: [id number] PT Time Calculation (min) (ACUTE ONLY): 15 min  Charges:  $Gait Training: 8-22 mins                    G Codes:      Reginia Naas 30-Mar-2016, 11:53 AM Magda Kiel, PT [phone] 03/30/2016

## 2016-03-29 ENCOUNTER — Other Ambulatory Visit: Payer: PPO

## 2016-03-29 DIAGNOSIS — I959 Hypotension, unspecified: Secondary | ICD-10-CM | POA: Diagnosis not present

## 2016-03-29 DIAGNOSIS — I10 Essential (primary) hypertension: Secondary | ICD-10-CM | POA: Diagnosis not present

## 2016-03-29 DIAGNOSIS — N390 Urinary tract infection, site not specified: Secondary | ICD-10-CM | POA: Diagnosis not present

## 2016-03-29 DIAGNOSIS — I482 Chronic atrial fibrillation: Secondary | ICD-10-CM | POA: Diagnosis not present

## 2016-03-29 DIAGNOSIS — D62 Acute posthemorrhagic anemia: Secondary | ICD-10-CM | POA: Diagnosis not present

## 2016-03-29 DIAGNOSIS — I255 Ischemic cardiomyopathy: Secondary | ICD-10-CM | POA: Diagnosis not present

## 2016-03-29 DIAGNOSIS — I5023 Acute on chronic systolic (congestive) heart failure: Secondary | ICD-10-CM | POA: Diagnosis not present

## 2016-03-29 DIAGNOSIS — I5022 Chronic systolic (congestive) heart failure: Secondary | ICD-10-CM | POA: Diagnosis not present

## 2016-03-29 LAB — CBC
HCT: 30.9 % — ABNORMAL LOW (ref 36.0–46.0)
Hemoglobin: 9.9 g/dL — ABNORMAL LOW (ref 12.0–15.0)
MCH: 30.9 pg (ref 26.0–34.0)
MCHC: 32 g/dL (ref 30.0–36.0)
MCV: 96.6 fL (ref 78.0–100.0)
PLATELETS: 138 10*3/uL — AB (ref 150–400)
RBC: 3.2 MIL/uL — ABNORMAL LOW (ref 3.87–5.11)
RDW: 13.6 % (ref 11.5–15.5)
WBC: 7.6 10*3/uL (ref 4.0–10.5)

## 2016-03-29 LAB — URINE MICROSCOPIC-ADD ON

## 2016-03-29 LAB — URINALYSIS, ROUTINE W REFLEX MICROSCOPIC
Bilirubin Urine: NEGATIVE
GLUCOSE, UA: NEGATIVE mg/dL
Ketones, ur: 15 mg/dL — AB
Nitrite: NEGATIVE
PH: 6 (ref 5.0–8.0)
Protein, ur: 30 mg/dL — AB
SPECIFIC GRAVITY, URINE: 1.019 (ref 1.005–1.030)

## 2016-03-29 LAB — HEMOGLOBIN AND HEMATOCRIT, BLOOD
HCT: 32.1 % — ABNORMAL LOW (ref 36.0–46.0)
Hemoglobin: 10.1 g/dL — ABNORMAL LOW (ref 12.0–15.0)

## 2016-03-29 MED ORDER — TRAMADOL HCL 50 MG PO TABS
50.0000 mg | ORAL_TABLET | Freq: Four times a day (QID) | ORAL | Status: DC | PRN
  Administered 2016-03-29 (×2): 50 mg via ORAL
  Filled 2016-03-29: qty 1

## 2016-03-29 MED ORDER — CEFTRIAXONE SODIUM 1 G IJ SOLR
1.0000 g | INTRAMUSCULAR | Status: DC
  Administered 2016-03-29 – 2016-03-31 (×3): 1 g via INTRAVENOUS
  Filled 2016-03-29 (×3): qty 10

## 2016-03-29 NOTE — Progress Notes (Signed)
PROGRESS NOTE  [name]  [name] DOB: 1922/09/12  DOA: 03/23/2016 PCP: [name] Pardon, MD  Primary Cardiologist: Dr. Ida Rogue  Brief Narrative:  82 year old female patient with a PMH of chronic atrial fibrillation on Coumadin, cardiomyopathy with LVEF 30-35 percent, chronic combined systolic and diastolic CHF, CAD, MR, TR, prolonged QT, LBBB, carotid disease, HTN, chronic thrombocytopenia, hypothyroid, HLD, GERD and lumbar stenosis, underwent elective lumbar decompression of L3-4, L4-5 on 7/12 and postop course complicated by postop surgical site hematoma, acute blood loss anemia and hypotension. Hospitalists were consulted on 7/12 for assistance. Cardiology consulting as well.   Assessment & Plan:   Principal Problem:   Acute blood loss anemia Active Problems:   Essential hypertension   Cardiomyopathy, ischemic   Chronic atrial fibrillation (HCC)   Chronic systolic CHF (congestive heart failure) (HCC)   Lumbar stenosis with neurogenic claudication   Surgery, elective   Arterial hypotension   Lumbar stenosis with neurogenic claudication - Status post surgical decompression of L3-4, 4-5 on 7/12. - Still has her surgical site drain with some blood draining- probably decreased and plans for removing drain 7/14. Management per primary service. - Recommend minimizing opioid analgesics as much as possible to avoid sedation & ongoing intermittent confusion.   Postop hematoma/acute blood loss anemia - In the setting of Lovenox (now held). - Hemoglobin dropped from 11.9 g preop to 8 g postop and down to 7.2 g on 7/13 - Status post 2 units PRBC on 7/13. Hemoglobin stable in the 9 g range since 7/14.  Chronic thrombocytopenia - Preop platelets 122 on 7/3. Platelet count up to 138  Essential hypertension - Controlled. Continue lisinopril and metoprolol.  Chronic atrial fibrillation - Patient was on Coumadin prior to surgery. - CHA2DS2 VASc score of 7. - Postop, was  being bridged with Lovenox and then developed postop hematoma with acute blood loss anemia. Lovenox held. - Restart anticoagulation when okay with neurosurgery and as per cardiology, may restart Coumadin without Lovenox/heparin bridging.  Chronic systolic CHF/ischemic cardiomyopathy - LVEF 30-35 percent. Seems compensated and euvolemic. Discussed with Cardiology, hold off on Lasix for now. - Although her weight has steadily increased from 125 (03/23/16) >136 > 140 > 144 pounds. Clinically appears euvolemic or even on the dry side. Continue to encourage oral fluid intake and hold Lasix in the absence of dyspnea or overt heart failure.  CAD - Asymptomatic of chest pain. Cardiology following.  Atelectasis  - Likely secondary to poor inspiratory effort related to pain and sedation. Minimize sedative medications and encourage out of bed and incentive spirometry - discussed extensively with patient's daughter at bedside and with RN.  Hypotension/orthostatic hypotension - Continue midodrine. Blood pressures have stabilized.  Hypothyroid. - Continue Synthroid.  Stage III chronic kidney disease/hyperkalemia - Creatinine normal at 1.10.  Difficulty voiding/urinary retention - Patient has had difficulty voiding and had to have in and out catheterization done 3 times in the last 24 hours. Discussed with RN extensively. Mobilize out of bed to chair and may continue when necessary in and out cath. Trying to avoid indwelling Foley catheter. Minimize pain medications. - As per family, voiding spontaneously up to this morning.  Possible UTI - Patient complained of dysuria this morning. Urine microscopy: Many bacteria, negative nitrites, too numerous to count RBCs and WBCs (some of the findings may be due to in and out Foley catheter trauma) - Due to dysuria, confusion which may be related to UTI, initiated IV Rocephin pending urine culture results.   DVT  prophylaxis: SCDs Code Status: DO NOT  RESUSCITATE Family Communication: Discussed in detail with patient's daughter at bedside.  Disposition Plan: To be determined. Not medically stable for discharge.    Consultants:   [facility] - are consultants  Cardiology   Procedures:   L3-4, L4-5 lumbar decompression surgery on 03/24/16.  In and out urinary catheterization  Antimicrobials:   Rocephin 7/17>   Subjective: Alert but confused and visual hallucinations this morning. As per daughter, seeing crickets crawling on wall. Voided spontaneously this morning.  Objective:  Filed Vitals:   03/29/16 0123 03/29/16 0408 03/29/16 0523 03/29/16 1347  BP: 136/78  142/91 151/91  Pulse: 80  91 75  Temp: 97.8 F (36.6 C)  98.8 F (37.1 C) 98.5 F (36.9 C)  TempSrc: Oral  Oral Oral  Resp: 20  20 20   Height:      Weight:  65.318 kg (144 lb)    SpO2: 96%  93% 97%    Intake/Output Summary (Last 24 hours) at 03/29/16 1550 Last data filed at 03/29/16 1347  Gross per 24 hour  Intake    600 ml  Output    150 ml  Net    450 ml   Filed Weights   03/27/16 0500 03/28/16 0456 03/29/16 0408  Weight: 63.504 kg (140 lb) 65.318 kg (144 lb) 65.318 kg (144 lb)    Examination:  General exam: Elderly female, small built and frail. Sitting up comfortably in chair this morning with brace. Respiratory system: Reduced breath sounds in the bases. Rest of lung fields clear to auscultation. Poor inspiratory effort.  Cardiovascular system: S1 & S2 heard, irregularly irregular. No JVD, rubs, gallops or clicks. No pedal edema. Systolic ejection murmur grade 3 x 6 best heard at apex. Gastrointestinal system: Abdomen is nondistended, soft and nontender. No organomegaly or masses felt. Normal bowel sounds heard. Central nervous system: Alert and oriented  to self only . No focal neurological deficits. Extremities: Moves all limbs symmetrically Skin: No rashes, lesions or ulcers Psychiatry: Pleasant and appropriate.    Data  Reviewed: I have personally reviewed following labs and imaging studies  CBC:  Recent Labs Lab 03/24/16 0956 03/24/16 1840 03/25/16 0422 03/25/16 1825 03/26/16 0347 03/27/16 0448 03/29/16 0557  WBC 10.5 9.4 8.3  --  8.2 8.4 7.6  NEUTROABS 7.8*  --   --   --   --   --   --   HGB 8.0* 7.8* 7.2* 10.1* 9.2* 9.3* 9.9*  HCT 25.1* 24.6* 23.0* 32.1* 28.4* 28.1* 30.9*  MCV 99.6 99.6 100.0  --  96.6 95.6 96.6  PLT 84* 77* 71*  --  70* 95* 0000000*   Basic Metabolic Panel:  Recent Labs Lab 03/25/16 1825 03/26/16 0347  NA 133* 135  K 4.1 4.2  CL 105 105  CO2 22 24  GLUCOSE 146* 105*  BUN 21* 24*  CREATININE 1.19* 1.10*  CALCIUM 8.3* 8.4*   GFR: Estimated Creatinine Clearance: 31.7 mL/min (by C-G formula based on Cr of 1.1). Liver Function Tests: No results for input(s): AST, ALT, ALKPHOS, BILITOT, PROT, ALBUMIN in the last 168 hours. No results for input(s): LIPASE, AMYLASE in the last 168 hours. No results for input(s): AMMONIA in the last 168 hours. Coagulation Profile:  Recent Labs Lab 03/23/16 1043  INR 1.15   Cardiac Enzymes: No results for input(s): CKTOTAL, CKMB, CKMBINDEX, TROPONINI in the last 168 hours. BNP (last 3 results) No results for input(s): PROBNP in the last 8760 hours. HbA1C: No  results for input(s): HGBA1C in the last 72 hours. CBG: No results for input(s): GLUCAP in the last 168 hours. Lipid Profile: No results for input(s): CHOL, HDL, LDLCALC, TRIG, CHOLHDL, LDLDIRECT in the last 72 hours. Thyroid Function Tests: No results for input(s): TSH, T4TOTAL, FREET4, T3FREE, THYROIDAB in the last 72 hours. Anemia Panel: No results for input(s): VITAMINB12, FOLATE, FERRITIN, TIBC, IRON, RETICCTPCT in the last 72 hours.  Sepsis Labs: No results for input(s): PROCALCITON, LATICACIDVEN in the last 168 hours.  No results found for this or any previous visit (from the past 240 hour(s)).       Radiology Studies: No results found.      Scheduled  Meds: . antiseptic oral rinse  7 mL Mouth Rinse BID  . docusate sodium  100 mg Oral BID  . ferrous sulfate  325 mg Oral Q lunch  . levothyroxine  88 mcg Oral QAC breakfast  . lisinopril  2.5 mg Oral QHS  . metoprolol tartrate  25 mg Oral BID  . midodrine  5 mg Oral TID WC  . multivitamin with minerals  1 tablet Oral Daily  . polyethylene glycol  17 g Oral Daily  . senna  1 tablet Oral BID  . simvastatin  10 mg Oral q1800  . sodium chloride flush  3 mL Intravenous Q12H   Continuous Infusions: . sodium chloride    . lactated ringers 10 mL/hr at 03/23/16 1100     LOS: 6 days    Time spent: 25 minutes.    [facility], MD [facility] Pager [phone]  If 7PM-7AM, please contact night-coverage [email or web address] Password [email or web address] 03/29/2016, 3:50 PM

## 2016-03-29 NOTE — Progress Notes (Signed)
Physical Therapy Treatment Patient Details Name: [name] MRN: [id number] DOB: 1922-05-11 Today's Date: 03/29/2016    History of Present Illness 82 yo female s/p L3-4 laminectomy. PMH: CAD, GERD, diverticulosis, arthritis, anemnia, HTn, Osteoporosis, OA, LBBB, COPD .  Post op ABL anemia s/p 2 units of PRBCs.      PT Comments    Patient progressing with ambulation distance and some with transfers.  Patient more alert and cooperative this pm than compared with OT session in am (possibly due to able to void).  Feel continued skilled PT in SNF setting appropriate for continued progress with mobility skills.  Follow Up Recommendations  SNF     Equipment Recommendations  Rolling walker with 5" wheels    Recommendations for Other Services       Precautions / Restrictions Precautions Precautions: Back;Fall Required Braces or Orthoses: Spinal Brace Spinal Brace: Lumbar corset;Applied in sitting position    Mobility  Bed Mobility Overal bed mobility: Needs Assistance   Rolling: Mod assist Sidelying to sit: Mod assist     Sit to sidelying: Mod assist General bed mobility comments: one step commands for each aspect and pt follow through with increased time; assist for legs off bed and trunk upright with pt pushing on rail, then to side with assist for legs into bed and hand placement for remaining on side  Transfers Overall transfer level: Needs assistance Equipment used: Rolling walker (2 wheeled) Transfers: Sit to/from Stand Sit to Stand: Mod assist;+2 physical assistance Stand pivot transfers: Mod assist       General transfer comment: cues for scooting to EOB and anterior weight shift, pt able to assist with coming upright, but needed lifting assist; back to bed after walking and return to room in recliner due to fatigue  Ambulation/Gait Ambulation/Gait assistance: Mod assist;+2 safety/equipment Ambulation Distance (Feet): 40 Feet Assistive device: Rolling walker  (2 wheeled) Gait Pattern/deviations: Shuffle;Decreased stride length;Step-to pattern;Step-through pattern;Trunk flexed;Narrow base of support     General Gait Details: stops at time and c/o UE fatigue, cues for more erect posture, step length and to continue to goal.  eyes open about 50% of the time   Stairs            Wheelchair Mobility    Modified Rankin (Stroke Patients Only)       Balance       Sitting balance - Comments: S with UE support   Standing balance support: Bilateral upper extremity supported Standing balance-Leahy Scale: Poor Standing balance comment: min A and UE support for balance                    Cognition Arousal/Alertness: Awake/alert Behavior During Therapy: Flat affect Overall Cognitive Status: Impaired/Different from baseline Area of Impairment: Attention;Safety/judgement;Awareness;Memory   Current Attention Level: Focused Memory: Decreased short-term memory;Decreased recall of precautions   Safety/Judgement: Decreased awareness of deficits;Decreased awareness of safety   Problem Solving: Slow processing;Decreased initiation;Difficulty sequencing      Exercises      General Comments        Pertinent Vitals/Pain Faces Pain Scale: Hurts even more Pain Location: back  Pain Descriptors / Indicators: Discomfort;Grimacing Pain Intervention(s): Monitored during session;Repositioned    Home Living                      Prior Function            PT Goals (current goals can now be found in the care plan section) Progress  towards PT goals: Progressing toward goals    Frequency  Min 5X/week    PT Plan Current plan remains appropriate    Co-evaluation             End of Session Equipment Utilized During Treatment: Gait belt;Back brace Activity Tolerance: Patient limited by fatigue Patient left: in bed;with call bell/phone within reach;with family/visitor present     Time: [id number] PT Time  Calculation (min) (ACUTE ONLY): 17 min  Charges:  $Gait Training: 8-22 mins                    G Codes:      Reginia Naas 2016-04-11, 5:15 PM

## 2016-03-29 NOTE — Evaluation (Signed)
Clinical/Bedside Swallow Evaluation Patient Details  Name: [name] MRN: [id number] Date of Birth: 04-29-1922  Today's Date: 03/29/2016 Time: SLP Start Time (ACUTE ONLY): 0840 SLP Stop Time (ACUTE ONLY): 0912 SLP Time Calculation (min) (ACUTE ONLY): 32 min  Past Medical History:  Past Medical History  Diagnosis Date  . Chronic atrial fibrillation (HCC)     a. on Coumadin (CHA2DS2VASc = 6); b. Holter 06/2010, no brady, mild tachy.  . CAD (coronary artery disease)     a. 2 DES, [facility], 2004; b. nuc 2013 septal dyssenergy 2/2 LBBB, nl LV fxn;  c. 06/2015 MV: EF 41%, septal HK (LBBB), no ischemia->Low risk.  . Pleural effusion associated with pulmonary infection 03-2007  . Hypothyroid   . Hyperlipidemia   . GERD (gastroesophageal reflux disease)   . Diverticulosis of colon   . Allergy   . Anemia   . Arthritis     osteo  . Hypertension   . Mitral regurgitation   . Vitamin B deficiency   . Compression fracture of lumbar vertebra ([place]) 05-2008  . Osteoporosis   . Thrombocytopenia ([place])   . OA (osteoarthritis)   . LBBB (left bundle branch block)   . Hx of colonoscopy   . Warfarin anticoagulation   . TR (tricuspid regurgitation)     moderate, echo 2008  . Dyslipidemia   . Lung abnormality     Dr [name] 122/2011 no further w/u needed  . Prolonged QT interval     when on sotolol in past  . Essential hypertension   . Weight loss     August, 2012  . Nausea     Some nausea with medications, May, 2013,  . Orthostatic hypotension     June, 2014  . Shingles 2014  . Ischemic cardiomyopathy     a. 03/2015 Echo: EF 30-35%, ant, antsept HK, mod MR, mildly dil LA/RV, mod dil RA, mod-sev TR, PASP 58mmHg.  [name] Hematemesis     a. 03/2015.  . Pulmonary hypertension ([place])     a. 03/2015 PASP 19mmHg by echo.  . Family history of adverse reaction to anesthesia     daughter PONV  . COPD (chronic obstructive pulmonary disease) ([place])   . Myocardial infarction [facility]) 2016    "mild heart  attack"  . Wears dentures   . Urinary frequency   . Nocturia   . Wears glasses   . Hard of hearing    Past Surgical History:  Past Surgical History  Procedure Laterality Date  . Thyroidectomy  1962  . Tonsillectomy    . Coronary angioplasty  2004  . Colonoscopy    . Lumbar laminectomy/decompression microdiscectomy N/A 03/23/2016    Procedure: Lumbar three-four Laminectomy/Foraminotomy, posterior laterial arthrodesis;  Surgeon: Leeroy Cha, MD;  Location: [facility]S;  Service: Neurosurgery;  Laterality: N/A;  L3-4 Laminectomy/Foraminotomy   HPI:  Pt is a 82 y.o. female with PMH chronic atrial fibrillation on Coumadin, cardiomyopathy with LVEF 30-35 percent, chronic combined systolic and diastolic CHF, CAD, MR, TR, prolonged QT, LBBB, carotid disease, HTN, chronic thrombocytopenia, hypothyroid, HLD, GERD and lumbar stenosis, underwent elective lumbar decompression of L3-4, L4-5 on 7/12 and postop course complicated by postop surgical site hematoma, acute blood loss anemia and hypotension. CXR 7/12 showed L lung base consolidation likely indicating postoperative atelectasis with PNA considered less likely. Pt reportedly becomes somnolent after having Percocet. Bedside swallow eval ordered to assist in ruling out aspiration.    Assessment / Plan / Recommendation Clinical Impression  Pt  showed no overt s/s of aspiration at bedside. Pt was lethargic during evaluation and required several cues to stay awake/ maintain attention to bolus. Daughter at bedside reported that pt has "bowed vocal folds" and that pt has had occasional coughing during meals, especially with liquids. Pt had MBS in 06/2015 which showed trace laryngeal penetration of thin liquids which pt sensed and cleared. Aspiration risk appears moderate due to lethargy and difficulty with positioning due to back pain, along with baseline decreased airway protection with vocal fold pathology. Recommend downgrading diet to dysphagia 2, thin  liquids, meds whole in puree, full supervision during meals to assist with self-feeding, monitor alertness, help pt sit as upright as tolerated, cue for small bites/ sips. Will continue to follow for diet tolerance/ consider advancement or need for objective evaluation.     Aspiration Risk  Moderate aspiration risk    Diet Recommendation Dysphagia 2 (Fine chop);Thin liquid   Liquid Administration via: Cup;Straw Medication Administration: Whole meds with puree Supervision: Patient able to self feed;Staff to assist with self feeding;Full supervision/cueing for compensatory strategies Compensations: Slow rate;Small sips/bites Postural Changes: Seated upright at 90 degrees;Remain upright for at least 30 minutes after po intake    Other  Recommendations Oral Care Recommendations: Oral care BID Other Recommendations: Clarify dietary restrictions   Follow up Recommendations  Skilled Nursing facility    Frequency and Duration min 2x/week  1 week       Prognosis Prognosis for Safe Diet Advancement: Good      Swallow Study   General HPI: Pt is a 82 y.o. female with PMH chronic atrial fibrillation on Coumadin, cardiomyopathy with LVEF 30-35 percent, chronic combined systolic and diastolic CHF, CAD, MR, TR, prolonged QT, LBBB, carotid disease, HTN, chronic thrombocytopenia, hypothyroid, HLD, GERD and lumbar stenosis, underwent elective lumbar decompression of L3-4, L4-5 on 7/12 and postop course complicated by postop surgical site hematoma, acute blood loss anemia and hypotension. CXR 7/12 showed L lung base consolidation likely indicating postoperative atelectasis with PNA considered less likely. Pt reportedly becomes somnolent after having Percocet. Bedside swallow eval ordered to assist in ruling out aspiration.  Type of Study: Bedside Swallow Evaluation Previous Swallow Assessment: outpatient MBS Oct 2016- WNL Diet Prior to this Study: Regular;Thin liquids Temperature Spikes Noted:  No Respiratory Status: Room air History of Recent Intubation: No Behavior/Cognition: Cooperative;Lethargic/Drowsy;Requires cueing Oral Cavity Assessment: Within Functional Limits Oral Care Completed by SLP: No Oral Cavity - Dentition: Dentures, top Vision: Functional for self-feeding Self-Feeding Abilities: Needs assist Patient Positioning: Partially reclined (due to back pain) Baseline Vocal Quality: Breathy;Hoarse Volitional Cough: Weak Volitional Swallow: Able to elicit    Oral/Motor/Sensory Function Overall Oral Motor/Sensory Function: Within functional limits   Ice Chips Ice chips: Not tested   Thin Liquid Thin Liquid: Impaired Presentation: Straw Pharyngeal  Phase Impairments: Multiple swallows    Nectar Thick Nectar Thick Liquid: Not tested   Honey Thick Honey Thick Liquid: Not tested   Puree Puree: Within functional limits Presentation: Self Fed;Spoon   Solid   GO   Solid: Impaired Presentation: Self Fed Oral Phase Impairments: Impaired mastication Pharyngeal Phase Impairments: Multiple swallows        Kern Reap, MA, CCC-SLP 03/29/2016,9:26 AM [phone]

## 2016-03-29 NOTE — Care Management Important Message (Signed)
Important Message  Patient Details  Name: [name] MRN: [id number] Date of Birth: 1922-09-08   Medicare Important Message Given:  Yes    Loann Quill 03/29/2016, 2:59 PM

## 2016-03-29 NOTE — Clinical Social Work Note (Signed)
CSW spoke with patient's daughter and presented bed offers.  Patient's daughter would like patient to go to [facility] for short term rehab.  CSW spoke to [facility] who said they can accept patient once insurance has been improved, and she is medically ready for discharge and orders have been received.  CSW to continue to follow patient's progress throughout discharge planning.  Jones Broom. [place], MSW, [place] 03/29/2016 4:56 PM

## 2016-03-29 NOTE — Progress Notes (Signed)
Occupational Therapy Treatment Patient Details Name: [name] MRN: [id number] DOB: 19-Mar-1922 Today's Date: 03/29/2016    History of present illness 82 yo female s/p L3-4 laminectomy. PMH: CAD, GERD, diverticulosis, arthritis, anemnia, HTn, Osteoporosis, OA, LBBB, COPD .  Post op ABL anemia s/p 2 units of PRBCs.     OT comments  Pt currently requires extensive assistance for all adls and transfers. Pt closing eyes throughout session and only opening them to cues. Pt with difficulty voiding bladder.   Follow Up Recommendations  SNF (requesting [facility])    Equipment Recommendations  3 in 1 bedside comode;Wheelchair (measurements OT);Wheelchair cushion (measurements OT);Hospital bed    Recommendations for Other Services Other (comment) (palliative care)    Precautions / Restrictions Precautions Precautions: Back;Fall Precaution Comments: unable to recall any precautions Required Braces or Orthoses: Spinal Brace Spinal Brace: Lumbar corset;Applied in sitting position       Mobility Bed Mobility Overal bed mobility: Needs Assistance Bed Mobility: Supine to Sit Rolling: +2 for physical assistance;Max assist   Supine to sit: +2 for physical assistance;Max assist     General bed mobility comments: pt facial grimace and unable to (A) much with transfer due to cognitive deficits and pain  Transfers Overall transfer level: Needs assistance Equipment used: Rolling walker (2 wheeled) Transfers: Sit to/from [facility] Sit to Stand: +2 physical assistance;Mod assist Stand pivot transfers: +2 physical assistance;Max assist       General transfer comment: Pt requires (A) to shift we    Balance Overall balance assessment: Needs assistance Sitting-balance support: Bilateral upper extremity supported;Feet supported Sitting balance-Leahy Scale: Poor Sitting balance - Comments: requires min- mod (A) to remain EOB Postural control: Posterior  lean Standing balance support: Bilateral upper extremity supported;During functional activity Standing balance-Leahy Scale: Poor Standing balance comment: heavy posterior lean and closing eyes throughout session                   ADL Overall ADL's : Needs assistance/impaired   Eating/Feeding Details (indicate cue type and reason): SLp finishing session. Daughter advised that precautions will continue at SNF         Lower Body Bathing: Total assistance;+2 for physical assistance;Sit to/from stand Lower Body Bathing Details (indicate cue type and reason): pt with posterior lean.          Toilet Transfer: +2 for physical assistance;Maximal assistance Toilet Transfer Details (indicate cue type and reason): stand pivot Toileting- Clothing Manipulation and Hygiene: Total assistance;+2 for physical assistance;Sit to/from stand Toileting - Clothing Manipulation Details (indicate cue type and reason): pt voiding upon standing from EOB. pt allowed time to void and again attempting to stand with voiding mid standing. pt returned to 3n1 to continue to void. Pt sit<>stand with therapist 3rd time and voiding bladder again in large volume. Daughter asked "is that her again?" Pt c/o pain at lower abdomen. RN and MD made aware. Clean hat placed in 3n1 and specimen caught for RN to send for testing       General ADL Comments: Pt requires multiple cues to open eyes this session pt opening eyes but with a static gaze stare. Pt without any scanning or tracking. daughter asking "why isnt she looking at me? I am wearing bright colors to help her."       Vision                 Additional Comments: not visually attending during session. pt only opening eyes when provided v/c to  open them by daughter or OT   Perception     Praxis      Cognition   Behavior During Therapy: Flat affect Overall Cognitive Status: Impaired/Different from baseline Area of Impairment:  Attention;Safety/judgement;Awareness;Memory   Current Attention Level: Focused Memory: Decreased short-term memory;Decreased recall of precautions    Safety/Judgement: Decreased awareness of deficits;Decreased awareness of safety Awareness: Emergent Problem Solving: Slow processing;Decreased initiation;Difficulty sequencing General Comments: Pt able to recall name birthday and place. Pt however uncertain of need to void. pt voiding with standing and each attempt to stand from 3n1. pt unaware of the need to void    Extremity/Trunk Assessment               Exercises     Shoulder Instructions       General Comments      Pertinent Vitals/ Pain       Pain Assessment: Faces Faces Pain Scale: Hurts even more Pain Location: R hip pain (pain with voiding bladder) Pain Descriptors / Indicators: Constant Pain Intervention(s): Monitored during session;Premedicated before session;Repositioned  Home Living                                          Prior Functioning/Environment              Frequency Min 2X/week     Progress Toward Goals  OT Goals(current goals can now be found in the care plan section)  Progress towards OT goals: Not progressing toward goals - comment  Acute Rehab OT Goals Patient Stated Goal: daughter wants [facility] or [facility] placement OT Goal Formulation: With patient Time For Goal Achievement: 04/07/16 Potential to Achieve Goals: Good ADL Goals Pt Will Perform Grooming: with set-up;sitting Pt Will Perform Upper Body Bathing: with set-up;sitting Pt Will Perform Toileting - Clothing Manipulation and hygiene: with min assist;sit to/from stand Additional ADL Goal #1: Pt will complete bed mobility min (A) as precursor to adls.  Plan Discharge plan remains appropriate    Co-evaluation                 End of Session Equipment Utilized During Treatment: Gait belt;Rolling walker;Back brace   Activity Tolerance Patient  tolerated treatment well   Patient Left in chair;with call bell/phone within reach;with chair alarm set;with family/visitor present   Nurse Communication Mobility status;Precautions        Time: [id number] OT Time Calculation (min): 30 min  Charges: OT General Charges $OT Visit: 1 Procedure OT Treatments $Self Care/Home Management : 23-37 mins  Parke Poisson B 03/29/2016, 10:13 AM   Jeri Modena   OTR/L Pager: [phone] Office: [phone] .

## 2016-03-29 NOTE — Progress Notes (Signed)
OT NOTE  OT left voicemail for SW regarding daughter's request for [facility] as first choice for d/c planning. Daughter wants to make sure that facility can provide transportation to urologist follow up that Dr Joya Salm is recommending. IF [facility] is not available for first pick then daughter requesting [facility] SNF.  Daughter eager to speak with SW regarding d/c planning  Jeri Modena   OTR/L Pager: [phone] Office: [phone] .

## 2016-03-29 NOTE — Progress Notes (Signed)
Patient ID: [name], female   DOB: 1921/11/14, 82 y.o.   MRN: [id number] STABLE, C/O BURNING TO URINATE    Specimen to the lab

## 2016-03-30 ENCOUNTER — Inpatient Hospital Stay (HOSPITAL_COMMUNITY): Payer: PPO

## 2016-03-30 DIAGNOSIS — I5042 Chronic combined systolic (congestive) and diastolic (congestive) heart failure: Secondary | ICD-10-CM | POA: Diagnosis not present

## 2016-03-30 DIAGNOSIS — I959 Hypotension, unspecified: Secondary | ICD-10-CM | POA: Diagnosis not present

## 2016-03-30 DIAGNOSIS — I5023 Acute on chronic systolic (congestive) heart failure: Secondary | ICD-10-CM | POA: Diagnosis not present

## 2016-03-30 DIAGNOSIS — I255 Ischemic cardiomyopathy: Secondary | ICD-10-CM | POA: Diagnosis not present

## 2016-03-30 DIAGNOSIS — M4806 Spinal stenosis, lumbar region: Secondary | ICD-10-CM | POA: Diagnosis not present

## 2016-03-30 DIAGNOSIS — I5022 Chronic systolic (congestive) heart failure: Secondary | ICD-10-CM | POA: Diagnosis not present

## 2016-03-30 DIAGNOSIS — E875 Hyperkalemia: Secondary | ICD-10-CM | POA: Diagnosis not present

## 2016-03-30 DIAGNOSIS — N183 Chronic kidney disease, stage 3 (moderate): Secondary | ICD-10-CM | POA: Diagnosis not present

## 2016-03-30 DIAGNOSIS — I130 Hypertensive heart and chronic kidney disease with heart failure and stage 1 through stage 4 chronic kidney disease, or unspecified chronic kidney disease: Secondary | ICD-10-CM | POA: Diagnosis not present

## 2016-03-30 DIAGNOSIS — I10 Essential (primary) hypertension: Secondary | ICD-10-CM | POA: Diagnosis not present

## 2016-03-30 DIAGNOSIS — G9761 Postprocedural hematoma of a nervous system organ or structure following a nervous system procedure: Secondary | ICD-10-CM | POA: Diagnosis not present

## 2016-03-30 DIAGNOSIS — Z66 Do not resuscitate: Secondary | ICD-10-CM | POA: Diagnosis not present

## 2016-03-30 DIAGNOSIS — J9811 Atelectasis: Secondary | ICD-10-CM | POA: Diagnosis not present

## 2016-03-30 DIAGNOSIS — D696 Thrombocytopenia, unspecified: Secondary | ICD-10-CM | POA: Diagnosis not present

## 2016-03-30 DIAGNOSIS — N390 Urinary tract infection, site not specified: Secondary | ICD-10-CM | POA: Diagnosis not present

## 2016-03-30 DIAGNOSIS — D62 Acute posthemorrhagic anemia: Secondary | ICD-10-CM | POA: Diagnosis not present

## 2016-03-30 DIAGNOSIS — I482 Chronic atrial fibrillation: Secondary | ICD-10-CM | POA: Diagnosis not present

## 2016-03-30 DIAGNOSIS — R0602 Shortness of breath: Secondary | ICD-10-CM | POA: Diagnosis not present

## 2016-03-30 LAB — CULTURE, OB URINE: SPECIAL REQUESTS: NORMAL

## 2016-03-30 LAB — BASIC METABOLIC PANEL
Anion gap: 10 (ref 5–15)
BUN: 26 mg/dL — ABNORMAL HIGH (ref 6–20)
CALCIUM: 8.6 mg/dL — AB (ref 8.9–10.3)
CO2: 22 mmol/L (ref 22–32)
CREATININE: 1.04 mg/dL — AB (ref 0.44–1.00)
Chloride: 105 mmol/L (ref 101–111)
GFR, EST AFRICAN AMERICAN: 52 mL/min — AB (ref >60)
GFR, EST NON AFRICAN AMERICAN: 45 mL/min — AB (ref >60)
Glucose, Bld: 84 mg/dL (ref 65–99)
Potassium: 4.1 mmol/L (ref 3.5–5.1)
SODIUM: 137 mmol/L (ref 135–145)

## 2016-03-30 LAB — CBC
HCT: 30.4 % — ABNORMAL LOW (ref 36.0–46.0)
Hemoglobin: 9.6 g/dL — ABNORMAL LOW (ref 12.0–15.0)
MCH: 30.8 pg (ref 26.0–34.0)
MCHC: 31.6 g/dL (ref 30.0–36.0)
MCV: 97.4 fL (ref 78.0–100.0)
PLATELETS: 150 10*3/uL (ref 150–400)
RBC: 3.12 MIL/uL — AB (ref 3.87–5.11)
RDW: 13.8 % (ref 11.5–15.5)
WBC: 7.8 10*3/uL (ref 4.0–10.5)

## 2016-03-30 LAB — PROCALCITONIN: PROCALCITONIN: 0.1 ng/mL

## 2016-03-30 MED ORDER — FUROSEMIDE 10 MG/ML IJ SOLN
40.0000 mg | Freq: Once | INTRAMUSCULAR | Status: AC
  Administered 2016-03-30: 40 mg via INTRAVENOUS

## 2016-03-30 MED ORDER — WARFARIN SODIUM 5 MG PO TABS
2.5000 mg | ORAL_TABLET | Freq: Once | ORAL | Status: AC
  Administered 2016-03-30: 2.5 mg via ORAL
  Filled 2016-03-30: qty 1

## 2016-03-30 MED ORDER — BOOST / RESOURCE BREEZE PO LIQD
1.0000 | ORAL | Status: DC
  Administered 2016-03-30 – 2016-04-01 (×3): 1 via ORAL
  Filled 2016-03-30 (×4): qty 1

## 2016-03-30 MED ORDER — BOOST PLUS PO LIQD
237.0000 mL | ORAL | Status: DC
  Administered 2016-03-30 – 2016-03-31 (×3): 237 mL via ORAL
  Filled 2016-03-30 (×3): qty 237

## 2016-03-30 MED ORDER — FUROSEMIDE 10 MG/ML IJ SOLN
INTRAMUSCULAR | Status: AC
  Filled 2016-03-30: qty 4

## 2016-03-30 MED ORDER — WARFARIN - PHYSICIAN DOSING INPATIENT: Freq: Every day | Status: DC

## 2016-03-30 NOTE — Progress Notes (Signed)
PROGRESS NOTE  [name]  [name] DOB: 1922-07-22  DOA: 03/23/2016 PCP: Loura Pardon, MD  Primary Cardiologist: Dr. Ida Rogue  Brief Narrative:  82 year old female patient with a PMH of chronic atrial fibrillation on Coumadin, cardiomyopathy with LVEF 30-35 percent, chronic combined systolic and diastolic CHF, CAD, MR, TR, prolonged QT, LBBB, carotid disease, HTN, chronic thrombocytopenia, hypothyroid, HLD, GERD and lumbar stenosis, underwent elective lumbar decompression of L3-4, L4-5 on 7/12 and postop course complicated by postop surgical site hematoma, acute blood loss anemia and hypotension. Hospitalists were consulted on 7/12 for assistance. Cardiology had consulted. Slow and poor postop progress. Intermittent mental status changes. Dyspnea on 7/18-possibly due to pulmonary edema.   Assessment & Plan:   Principal Problem:   Acute blood loss anemia Active Problems:   Essential hypertension   Cardiomyopathy, ischemic   Chronic atrial fibrillation (HCC)   Chronic systolic CHF (congestive heart failure) (HCC)   Lumbar stenosis with neurogenic claudication   Surgery, elective   Arterial hypotension   Lumbar stenosis with neurogenic claudication - Status post surgical decompression of L3-4, 4-5 on 7/12. - Still has her surgical site drain with some blood draining- probably decreased and plans for removing drain 7/14. Management per primary service. - Recommend minimizing opioid analgesics as much as possible to avoid sedation & ongoing intermittent confusion.   Postop hematoma/acute blood loss anemia - In the setting of Lovenox (now held). - Hemoglobin dropped from 11.9 g preop to 8 g postop and down to 7.2 g on 7/13 - Status post 2 units PRBC on 7/13. Hemoglobin stable in the 9 g range since 7/14.  Chronic thrombocytopenia - Preop platelets 122 on 7/3. Platelet count up to 150  Essential hypertension - Controlled. Continue lisinopril and metoprolol.  Chronic  atrial fibrillation - Patient was on Coumadin prior to surgery. - CHA2DS2 VASc score of 7. - Postop, was being bridged with Lovenox and then developed postop hematoma with acute blood loss anemia. Lovenox held. - Restart anticoagulation when okay with neurosurgery and as per cardiology, may restart Coumadin without Lovenox/heparin bridging.  Acute on Chronic systolic CHF/ischemic cardiomyopathy - LVEF 30-35 percent. Seems compensated and euvolemic.  - Lasix had been held postoperatively due to poor oral intake, felt to be clinically euvolemic even though weight was up and asymptomatic of dyspnea. - On 7/18, noted to be dyspneic. Although chest x-ray suggested basilar atelectasis versus infiltrate, no productive cough, high fever or leukocytosis and pro-calcitonin not elevated. Good response with a dose of IV Lasix 40 mg 1. - Reassess in a.m. with chest x-ray and consider Lasix as needed.  CAD - Asymptomatic of chest pain. Cardiology had seen  Atelectasis  - Likely secondary to poor inspiratory effort related to pain and sedation. Minimize sedative medications and encourage out of bed and incentive spirometry - discussed repeatedly with patient's daughter.  Hypotension/orthostatic hypotension - Continue midodrine. Blood pressures have stabilized.  Hypothyroid. - Continue Synthroid.  Stage III chronic kidney disease/hyperkalemia - Creatinine normal at 1.10.  Difficulty voiding/urinary retention - Patient has had difficulty voiding and had to have in and out catheterization done 3 times in the last 24 hours. Discussed with RN extensively. Mobilize out of bed to chair and may continue when necessary in and out cath. Trying to avoid indwelling Foley catheter. Minimize pain medications. - Seems to have resolved for now.  Possible UTI - Patient complained of dysuria this morning. Urine microscopy: Many bacteria, negative nitrites, too numerous to count RBCs and WBCs (some of  the findings  may be due to in and out Foley catheter trauma) - Due to dysuria, confusion which may be related to UTI, initiated IV Rocephin pending urine culture results. - Urine culture suggests contamination. Complete course of antibiotics.   DVT prophylaxis: SCDs Code Status: DO NOT RESUSCITATE Family Communication: Discussed in detail with patient's daughter at bedside.  Disposition Plan: To be determined. Not medically stable for discharge.    Consultants:   [facility] - are consultants  Cardiology   Procedures:   L3-4, L4-5 lumbar decompression surgery on 03/24/16.  In and out urinary catheterization  Antimicrobials:   Rocephin 7/17>   Subjective: Noted to be dyspneic this morning. No chest pain reported.  Objective:  Filed Vitals:   03/30/16 0300 03/30/16 0510 03/30/16 1042 03/30/16 1501  BP: 144/72 144/72 146/81   Pulse: 77 69 74 71  Temp: 98 F (36.7 C) 97.7 F (36.5 C) 98 F (36.7 C) 97.9 F (36.6 C)  TempSrc: Oral Oral Oral Oral  Resp: 18 20 16 20   Height:      Weight:      SpO2: 95% 94% 96% 98%    Intake/Output Summary (Last 24 hours) at 03/30/16 1859 Last data filed at 03/30/16 1800  Gross per 24 hour  Intake    960 ml  Output      0 ml  Net    960 ml   Filed Weights   03/27/16 0500 03/28/16 0456 03/29/16 0408  Weight: 63.504 kg (140 lb) 65.318 kg (144 lb) 65.318 kg (144 lb)    Examination:  General exam: Elderly female, small built and frail. Sitting propped up in bed with mild increased work of breathing. Respiratory system: Reduced breath sounds in the bases with scattered bibasal crackles. Mildly tachypnea.  Cardiovascular system: S1 & S2 heard, irregularly irregular. No JVD, rubs, gallops or clicks. No pedal edema. Systolic ejection murmur grade 3 x 6 best heard at apex. Gastrointestinal system: Abdomen is nondistended, soft and nontender. No organomegaly or masses felt. Normal bowel sounds heard. Central nervous system: Alert and  oriented  to self only . No focal neurological deficits. Extremities: Moves all limbs symmetrically Skin: No rashes, lesions or ulcers Psychiatry: Pleasant and appropriate.    Data Reviewed: I have personally reviewed following labs and imaging studies  CBC:  Recent Labs Lab 03/24/16 0956  03/25/16 0422 03/25/16 1825 03/26/16 0347 03/27/16 0448 03/29/16 0557 03/30/16 0357  WBC 10.5  < > 8.3  --  8.2 8.4 7.6 7.8  NEUTROABS 7.8*  --   --   --   --   --   --   --   HGB 8.0*  < > 7.2* 10.1* 9.2* 9.3* 9.9* 9.6*  HCT 25.1*  < > 23.0* 32.1* 28.4* 28.1* 30.9* 30.4*  MCV 99.6  < > 100.0  --  96.6 95.6 96.6 97.4  PLT 84*  < > 71*  --  70* 95* 138* 150  < > = values in this interval not displayed. Basic Metabolic Panel:  Recent Labs Lab 03/25/16 1825 03/26/16 0347 03/30/16 0357  NA 133* 135 137  K 4.1 4.2 4.1  CL 105 105 105  CO2 22 24 22   GLUCOSE 146* 105* 84  BUN 21* 24* 26*  CREATININE 1.19* 1.10* 1.04*  CALCIUM 8.3* 8.4* 8.6*   GFR: Estimated Creatinine Clearance: 33.6 mL/min (by C-G formula based on Cr of 1.04). Liver Function Tests: No results for input(s): AST, ALT, ALKPHOS, BILITOT, PROT, ALBUMIN in  the last 168 hours. No results for input(s): LIPASE, AMYLASE in the last 168 hours. No results for input(s): AMMONIA in the last 168 hours. Coagulation Profile: No results for input(s): INR, PROTIME in the last 168 hours. Cardiac Enzymes: No results for input(s): CKTOTAL, CKMB, CKMBINDEX, TROPONINI in the last 168 hours. BNP (last 3 results) No results for input(s): PROBNP in the last 8760 hours. HbA1C: No results for input(s): HGBA1C in the last 72 hours. CBG: No results for input(s): GLUCAP in the last 168 hours. Lipid Profile: No results for input(s): CHOL, HDL, LDLCALC, TRIG, CHOLHDL, LDLDIRECT in the last 72 hours. Thyroid Function Tests: No results for input(s): TSH, T4TOTAL, FREET4, T3FREE, THYROIDAB in the last 72 hours. Anemia Panel: No results for  input(s): VITAMINB12, FOLATE, FERRITIN, TIBC, IRON, RETICCTPCT in the last 72 hours.  Sepsis Labs:  Recent Labs Lab 03/30/16 1438  PROCALCITON 0.10    Recent Results (from the past 240 hour(s))  OB Urine Culture     Status: Abnormal   Collection Time: 03/29/16 10:10 AM  Result Value Ref Range Status   Specimen Description OB CLEAN CATCH  Final   Special Requests Normal  Final   Culture (A)  Final    MULTIPLE SPECIES PRESENT, SUGGEST RECOLLECTION NO GROUP B STREP (S.AGALACTIAE) ISOLATED    Report Status 03/30/2016 FINAL  Final         Radiology Studies: Dg Chest Port 1 View  03/30/2016  CLINICAL DATA:  Shortness of Breath EXAM: PORTABLE CHEST 1 VIEW COMPARISON:  03/24/2016 FINDINGS: Cardiomegaly again noted. No pulmonary edema. Mild perihilar bronchitic changes. There is small bilateral pleural effusion with bilateral basilar atelectasis or infiltrate. Atherosclerotic calcifications of thoracic aorta again noted IMPRESSION: Cardiomegaly. Small bilateral pleural effusion with bilateral basilar atelectasis or infiltrate. Mild perihilar bronchitic changes. Electronically Signed   By: Lahoma Crocker M.D.   On: 03/30/2016 12:13        Scheduled Meds: . antiseptic oral rinse  7 mL Mouth Rinse BID  . cefTRIAXone (ROCEPHIN)  IV  1 g Intravenous Q24H  . docusate sodium  100 mg Oral BID  . feeding supplement  1 Container Oral Q24H  . ferrous sulfate  325 mg Oral Q lunch  . lactose free nutrition  237 mL Oral Q24H  . levothyroxine  88 mcg Oral QAC breakfast  . lisinopril  2.5 mg Oral QHS  . metoprolol tartrate  25 mg Oral BID  . midodrine  5 mg Oral TID WC  . multivitamin with minerals  1 tablet Oral Daily  . polyethylene glycol  17 g Oral Daily  . senna  1 tablet Oral BID  . simvastatin  10 mg Oral q1800  . sodium chloride flush  3 mL Intravenous Q12H  . Warfarin - Physician Dosing Inpatient   Does not apply q1800   Continuous Infusions: . sodium chloride    . lactated  ringers 10 mL/hr at 03/23/16 1100     LOS: 7 days    Time spent: 45 minutes.    [facility], MD [facility] Pager [phone]  If 7PM-7AM, please contact night-coverage [email or web address] Password [facility] 03/30/2016, 6:59 PM

## 2016-03-30 NOTE — Progress Notes (Signed)
Patient ID: [name], female   DOB: 30-Sep-1921, 82 y.o.   MRN: [id number]

## 2016-03-30 NOTE — Progress Notes (Signed)
Patient ID: [name], female   DOB: 1921-11-10, 82 y.o.   MRN: [id number] Spoke with daughter  . Atelectasis by xray. uti  Culture pending. i did tell her that to avoid confusion all the medical question should be adressd to the hospitalist service. Started on coumadin 2.5

## 2016-03-30 NOTE — Progress Notes (Signed)
ANTICOAGULATION CONSULT NOTE - Initial Consult  Pharmacy Consult for coumadin Indication: atrial fibrillation  Allergies  Allergen Reactions  . Sulfonamide Derivatives Nausea And Vomiting    REACTION: sick  . Alendronate Sodium Other (See Comments)    REACTION: GI    Patient Measurements: Height: _0  (170.2 cm) Weight: 144 lb (65.318 kg) IBW/kg (Calculated) : 61.6   Vital Signs: Temp: 97.9 F (36.6 C) (07/18 1501) Temp Source: Oral (07/18 1501) BP: 146/81 mmHg (07/18 1042) Pulse Rate: 71 (07/18 1501)  Labs:  Recent Labs  03/29/16 0557 03/30/16 0357  HGB 9.9* 9.6*  HCT 30.9* 30.4*  PLT 138* 150  CREATININE  --  1.04*    Estimated Creatinine Clearance: 33.6 mL/min (by C-G formula based on Cr of 1.04).   Medical History: Past Medical History  Diagnosis Date  . Chronic atrial fibrillation (HCC)     a. on Coumadin (CHA2DS2VASc = 6); b. Holter 06/2010, no brady, mild tachy.  . CAD (coronary artery disease)     a. 2 DES, [facility], 2004; b. nuc 2013 septal dyssenergy 2/2 LBBB, nl LV fxn;  c. 06/2015 MV: EF 41%, septal HK (LBBB), no ischemia->Low risk.  . Pleural effusion associated with pulmonary infection 03-2007  . Hypothyroid   . Hyperlipidemia   . GERD (gastroesophageal reflux disease)   . Diverticulosis of colon   . Allergy   . Anemia   . Arthritis     osteo  . Hypertension   . Mitral regurgitation   . Vitamin B deficiency   . Compression fracture of lumbar vertebra ([place]) 05-2008  . Osteoporosis   . Thrombocytopenia ([place])   . OA (osteoarthritis)   . LBBB (left bundle branch block)   . Hx of colonoscopy   . Warfarin anticoagulation   . TR (tricuspid regurgitation)     moderate, echo 2008  . Dyslipidemia   . Lung abnormality     Dr [name] 122/2011 no further w/u needed  . Prolonged QT interval     when on sotolol in past  . Essential hypertension   . Weight loss     August, 2012  . Nausea     Some nausea with medications, May, 2013,  . Orthostatic  hypotension     June, 2014  . Shingles 2014  . Ischemic cardiomyopathy     a. 03/2015 Echo: EF 30-35%, ant, antsept HK, mod MR, mildly dil LA/RV, mod dil RA, mod-sev TR, PASP 37mHg.  .Marland KitchenHematemesis     a. 03/2015.  . Pulmonary hypertension ([place]     a. 03/2015 PASP 556mg by echo.  . Family history of adverse reaction to anesthesia     daughter PONV  . COPD (chronic obstructive pulmonary disease) ([facility]  . [facility]    "mild heart attack"  . Wears dentures   . Urinary frequency   . Nocturia   . Wears glasses   . Hard of hearing     Medications:  Prescriptions prior to admission  Medication Sig Dispense Refill Last Dose  . calcium carbonate (OS-CAL) 600 MG TABS tablet Take 1,200 mg by mouth every evening.    Past Month at Unknown time  . enoxaparin (LOVENOX) 60 MG/0.6ML injection Inject 0.6 mLs (60 mg total) into the skin every 12 (twelve) hours. 3.6 mL 1 03/22/2016 at 2015  . ferrous sulfate 325 (65 FE) MG tablet Take 325 mg by mouth daily. With lunch   Past Month at Unknown time  . furosemide (LASIX)  20 MG tablet Take 1 tablet (20 mg total) by mouth daily as needed. (Patient taking differently: Take 20 mg by mouth daily as needed for fluid. Daily weight check) 90 tablet 3 Taking  . Glucosamine HCl 500 MG TABS Take 500 mg by mouth daily.    Past Month at Unknown time  . levothyroxine (SYNTHROID, LEVOTHROID) 88 MCG tablet Take 1 tablet (88 mcg total) by mouth daily. 90 tablet 1 03/23/2016 at 0700  . lisinopril (PRINIVIL,ZESTRIL) 2.5 MG tablet TAKE ONE TABLET AT BEDTIME (8PM) (Patient taking differently: Take 2.5 mg by mouth at bedtime. BEDTIME (8PM)) 90 tablet 3 03/22/2016 at Unknown time  . Lutein 6 MG CAPS Take 1 capsule by mouth daily.    Past Month at Unknown time  . metoprolol tartrate (LOPRESSOR) 25 MG tablet Take 1 tablet (25 mg total) by mouth 2 (two) times daily. 180 tablet 3 03/23/2016 at 0700  . midodrine (PROAMATINE) 5 MG tablet TAKE ONE TABLET BY MOUTH  TWICE DAILY WITH A MEAL 180 tablet 3 03/23/2016 at 0700  . mirtazapine (REMERON) 15 MG tablet Take 1 tablet (15 mg total) by mouth at bedtime. 90 tablet 3 03/22/2016 at Unknown time  . Multiple Vitamin (MULTIVITAMIN) tablet Take 1 tablet by mouth daily.    Past Month at Unknown time  . pantoprazole (PROTONIX) 40 MG tablet Take 1 tablet (40 mg total) by mouth daily. 90 tablet 1 03/23/2016 at 0700  . potassium chloride (K-DUR,KLOR-CON) 10 MEQ tablet Take 1 tablet (10 mEq total) by mouth daily. (Patient taking differently: Take 10 mEq by mouth every other day. ) 90 tablet 1 Past Month at Unknown time  . senna-docusate (SENOKOT-S) 8.6-50 MG per tablet Take 1 tablet by mouth at bedtime.   Past Month at Unknown time  . simvastatin (ZOCOR) 20 MG tablet TAKE 1/2 TABLET  (10MG  TOTAL) EVERY EVENING (Patient taking differently: Take 5 mg by mouth every evening. ) 45 tablet 1 03/22/2016 at Unknown time  . vitamin B-12 (CYANOCOBALAMIN) 1000 MCG tablet Take 1 tablet (1,000 mcg total) by mouth daily. 30 tablet 0 Past Month at Unknown time  . warfarin (COUMADIN) 5 MG tablet Take as directed by anti-coagulation clinic. (Patient taking differently: Take 5 mg by mouth daily at 6 PM. Take 1 tablet 30m all days except on Monday and Friday take 7.550m 120 tablet 0 03/18/2016 at Unknown time  . Cyanocobalamin 1000 MCG/ML KIT Inject 1 mL as directed every 8 (eight) weeks. (Patient not taking: Reported on 03/11/2016) 1 kit 0 Taking  . fluticasone (FLONASE) 50 MCG/ACT nasal spray Place 2 sprays into both nostrils daily. (Patient taking differently: Place 2 sprays into both nostrils as needed. ) 16 g 11 Taking    Assessment: [age]o F on coumadin PTA for afib. She had an elective lumbar decompression on7/12 and postop course complicated by postop surgical site hematoma, acute blood loss anemia and hypotension.  Pharmacy has been consulted to resume coumadin dosing for afib.   coumadin 2.5 mg ordered by Dr BoJoya Salmnd given at  1735 Daily INR already ordered  Goal of Therapy:  INR 2-3   Plan: coumadin 2.5 mg ordered by Dr BoJoya Salmnd given at 1735 Daily INR already ordered Will start ordering coumadin doses on 7/19   MiEudelia BunchPharm.D. [id number]/18/2017 7:20 PM

## 2016-03-30 NOTE — Progress Notes (Signed)
Pt became SOB and stated she felt like she could not get her breath. Dr Jake Bathe in with pt. Pt to have a chest xray and had lasix IV stat. All vitak signs were stable

## 2016-03-30 NOTE — Progress Notes (Signed)
PHARMACIST - PHYSICIAN COMMUNICATION  Dr: Joya Salm   CONCERNING:  Coumadin   RECOMMENDATION:  Please let pharmacy know when/if  to resume Coumadin while in patient?    DESCRIPTION: On Coumadin prior to admission for Afib  Thank you Anette Guarneri, PharmD [phone]

## 2016-03-30 NOTE — Progress Notes (Signed)
Speech Language Pathology Treatment: Dysphagia (family education for dysphagia)  Patient Details Name: [name] MRN: [id number] DOB: 05/30/1922 Today's Date: 03/30/2016 Time: [id number] SLP Time Calculation (min) (ACUTE ONLY): 8 min  Assessment / Plan / Recommendation Clinical Impression  Treatment focused on education with pt's daughter re: dysphagia. Pt sleeping and daughter reported "she is having issues this morning, difficulty breathing, difficulty swallowing and doctor is checking pt for CHF versus pna." SLP provided education re: relationship between pt's chronic dysphagia and new medical condition depleting pt 's reserve and ability to compensate and protect her airway. SLP reiterated importance of upright position and encouraged daughter to slowly raise head of bed and use reverse Trendelenburg position. SLP will follow up next date to observe pt with po's.   HPI HPI: Pt is a 82 y.o. female with PMH chronic atrial fibrillation on Coumadin, cardiomyopathy with LVEF 30-35 percent, chronic combined systolic and diastolic CHF, CAD, MR, TR, prolonged QT, LBBB, carotid disease, HTN, chronic thrombocytopenia, hypothyroid, HLD, GERD and lumbar stenosis, underwent elective lumbar decompression of L3-4, L4-5 on 7/12 and postop course complicated by postop surgical site hematoma, acute blood loss anemia and hypotension. CXR 7/12 showed L lung base consolidation likely indicating postoperative atelectasis with PNA considered less likely. Pt reportedly becomes somnolent after having Percocet. Bedside swallow eval ordered to assist in ruling out aspiration.       SLP Plan  Continue with current plan of care     Recommendations  Diet recommendations: Dysphagia 2 (fine chop);Thin liquid Liquids provided via: Cup Medication Administration: Whole meds with puree Supervision: Patient able to self feed;Full supervision/cueing for compensatory strategies Compensations: Slow rate;Small  sips/bites Postural Changes and/or Swallow Maneuvers: Seated upright 90 degrees             Oral Care Recommendations: Oral care BID Follow up Recommendations: Skilled Nursing facility Plan: Continue with current plan of care                     Houston Siren 03/30/2016, 11:34 AM   Orbie Pyo Colvin Caroli.Ed [employer] [phone]

## 2016-03-30 NOTE — Progress Notes (Signed)
PT Cancellation Note  Patient Details Name: [name] MRN: [id number] DOB: 1921-09-19   Cancelled Treatment:    Reason Eval/Treat Not Completed: Fatigue/lethargy limiting ability to participate; noted pt with SOB, UTI and now just back to bed after up to [facility]  Will cancel PT for today and attempt tomorrow.    Reginia Naas 03/30/2016, 3:29 PM  Magda Kiel, [place] 03/30/2016

## 2016-03-30 NOTE — Progress Notes (Addendum)
Initial Nutrition Assessment   INTERVENTION:  Provide Boost Plus po once daily, each supplement provides 360 kcal and 14 grams of protein Provide Boost Breeze po once daily, each supplement provides 250 kcal and 9 grams of protein Provide Magic cup BID with meals, each supplement provides 290 kcal and 9 grams of protein    NUTRITION DIAGNOSIS:   Inadequate oral intake related to poor appetite as evidenced by meal completion < 25%, per patient/family report.   GOAL:   Patient will meet greater than or equal to 90% of their needs   MONITOR:   PO intake, Supplement acceptance, Skin, Weight trends, Labs, I & O's  REASON FOR ASSESSMENT:   Consult Poor PO (per family request)  ASSESSMENT:   82 yo female s/p L3-4 laminectomy. PMH: CAD, GERD, diverticulosis, arthritis, anemnia, HTn, Osteoporosis, OA, LBBB, COPD . Post op ABL anemia s/p 2 units of PRBCs.   Pt asleep at time of visit. Per pt's daughter at bedside, pt is having difficulty breathing today. Per daughter pt ate half of her oatmeal for breakfast this morning which is about the amount she has been eating at all meals since admission. Pt is never a big eater, but was eating better PTA and maintaining her weight around 122-125 lbs. Daughter states that patient dislikes Ensure, but eats Ensure pudding at home. Per daughter, pt had a BM this morning after receiving Miralax; 1st BM since 7/11.   Labs: low hemoglobin  Diet Order:  DIET DYS 2 Room service appropriate?: Yes; Fluid consistency:: Thin  Skin:  Reviewed, no issues (closed incision on back)  Last BM:  7/11  Height:   Ht Readings from Last 1 Encounters:  03/23/16 5\' 7"  (1.702 m)    Weight:   Wt Readings from Last 1 Encounters:  03/29/16 144 lb (65.318 kg)    Ideal Body Weight:  61.36 kg  BMI:  Body mass index is 22.55 kg/(m^2).  Estimated Nutritional Needs:   Kcal:  1300-1500  Protein:  80-90 grams  Fluid:  1.5 L/day  EDUCATION NEEDS:   No  education needs identified at this time  [place], LDN Inpatient Clinical Dietitian Pager: [phone] After Hours Pager: [phone]

## 2016-03-31 ENCOUNTER — Inpatient Hospital Stay (HOSPITAL_COMMUNITY): Payer: PPO

## 2016-03-31 DIAGNOSIS — I482 Chronic atrial fibrillation: Secondary | ICD-10-CM | POA: Diagnosis not present

## 2016-03-31 DIAGNOSIS — I5023 Acute on chronic systolic (congestive) heart failure: Secondary | ICD-10-CM

## 2016-03-31 DIAGNOSIS — J811 Chronic pulmonary edema: Secondary | ICD-10-CM | POA: Diagnosis not present

## 2016-03-31 DIAGNOSIS — D62 Acute posthemorrhagic anemia: Secondary | ICD-10-CM | POA: Diagnosis not present

## 2016-03-31 LAB — BASIC METABOLIC PANEL
ANION GAP: 8 (ref 5–15)
BUN: 25 mg/dL — AB (ref 6–20)
CALCIUM: 8.8 mg/dL — AB (ref 8.9–10.3)
CO2: 29 mmol/L (ref 22–32)
CREATININE: 1.21 mg/dL — AB (ref 0.44–1.00)
Chloride: 102 mmol/L (ref 101–111)
GFR, EST AFRICAN AMERICAN: 44 mL/min — AB (ref >60)
GFR, EST NON AFRICAN AMERICAN: 38 mL/min — AB (ref >60)
GLUCOSE: 98 mg/dL (ref 65–99)
POTASSIUM: 3.9 mmol/L (ref 3.5–5.1)
Sodium: 139 mmol/L (ref 135–145)

## 2016-03-31 LAB — IRON AND TIBC
Iron: 42 ug/dL (ref 28–170)
SATURATION RATIOS: 18 % (ref 10.4–31.8)
TIBC: 231 ug/dL — ABNORMAL LOW (ref 250–450)
UIBC: 189 ug/dL

## 2016-03-31 LAB — CBC
HCT: 32.2 % — ABNORMAL LOW (ref 36.0–46.0)
Hemoglobin: 10.2 g/dL — ABNORMAL LOW (ref 12.0–15.0)
MCH: 30.8 pg (ref 26.0–34.0)
MCHC: 31.7 g/dL (ref 30.0–36.0)
MCV: 97.3 fL (ref 78.0–100.0)
PLATELETS: 179 10*3/uL (ref 150–400)
RBC: 3.31 MIL/uL — AB (ref 3.87–5.11)
RDW: 13.6 % (ref 11.5–15.5)
WBC: 8.6 10*3/uL (ref 4.0–10.5)

## 2016-03-31 LAB — FERRITIN: Ferritin: 498 ng/mL — ABNORMAL HIGH (ref 11–307)

## 2016-03-31 LAB — PROTIME-INR
INR: 1.28 (ref 0.00–1.49)
Prothrombin Time: 16.1 seconds — ABNORMAL HIGH (ref 11.6–15.2)

## 2016-03-31 LAB — BRAIN NATRIURETIC PEPTIDE: B NATRIURETIC PEPTIDE 5: 616.2 pg/mL — AB (ref 0.0–100.0)

## 2016-03-31 MED ORDER — WARFARIN - PHARMACIST DOSING INPATIENT
Freq: Every day | Status: DC
  Administered 2016-03-31 – 2016-04-01 (×2)

## 2016-03-31 MED ORDER — WARFARIN SODIUM 3 MG PO TABS
3.0000 mg | ORAL_TABLET | Freq: Once | ORAL | Status: AC
  Administered 2016-03-31: 3 mg via ORAL
  Filled 2016-03-31: qty 1

## 2016-03-31 NOTE — Progress Notes (Signed)
Patient ID: [name], female   DOB: [birth year], 82 y.o.   MRN: [id number] Doing well. Urine negative. Continue as per hospitalist

## 2016-03-31 NOTE — Progress Notes (Signed)
Physical Therapy Treatment Patient Details Name: [name] MRN: [id number] DOB: March 18, 1922 Today's Date: 03/31/2016    History of Present Illness 82 yo female s/p L3-4 laminectomy. uti (+) pending workup for chest xray ordered 7/19/17PMH: CAD, GERD, diverticulosis, arthritis, anemnia, HTn, Osteoporosis, OA, LBBB, COPD .  Post op ABL anemia s/p 2 units of PRBCs.      PT Comments    Patient much improved this session able to mobilize with less assist and walking farther.  Continue to feel appropriate for SNF level rehab.  Will decrease frequency based on standards of care.  Follow Up Recommendations  SNF     Equipment Recommendations  Rolling walker with 5" wheels    Recommendations for Other Services       Precautions / Restrictions Precautions Precautions: Back;Fall Required Braces or Orthoses: Spinal Brace Spinal Brace: Lumbar corset;Applied in sitting position    Mobility  Bed Mobility Overal bed mobility: Needs Assistance Bed Mobility: Rolling;Sidelying to Sit Rolling: Min assist Sidelying to sit: Mod assist     Sit to sidelying: Mod assist General bed mobility comments: assist to push up and lift trunk, slow but got legs off bed  Transfers Overall transfer level: Needs assistance Equipment used: Rolling walker (2 wheeled) Transfers: Sit to/from [facility] Sit to Stand: Min assist Stand pivot transfers: Min assist       General transfer comment: up from bed assist for balance; then BSC to bed   Ambulation/Gait   Ambulation Distance (Feet): 60 Feet (x 2) Assistive device: Rolling walker (2 wheeled) Gait Pattern/deviations: Step-through pattern;Decreased stride length;Trunk flexed;Narrow base of support     General Gait Details: more fluid with gait and, though fatigued and still pain limited more erect posture thorughout, encouragement to continue walking despite pain   Stairs            Wheelchair Mobility    Modified  Rankin (Stroke Patients Only)       Balance Overall balance assessment: Needs assistance   Sitting balance-Leahy Scale: Good       Standing balance-Leahy Scale: Poor Standing balance comment: UE support for balance                    Cognition Arousal/Alertness: Awake/alert Behavior During Therapy: WFL for tasks assessed/performed   Area of Impairment: Attention;Safety/judgement;Awareness;Memory   Current Attention Level: Sustained Memory: Decreased recall of precautions;Decreased short-term memory   Safety/Judgement: Decreased awareness of safety;Decreased awareness of deficits   Problem Solving: Slow processing;Difficulty sequencing      Exercises      General Comments General comments (skin integrity, edema, etc.): noted redness on buttocks and barrier cream applied      Pertinent Vitals/Pain Faces Pain Scale: Hurts even more Pain Location: back with ambulation Pain Descriptors / Indicators: Aching Pain Intervention(s): Repositioned;Monitored during session    Home Living                      Prior Function            PT Goals (current goals can now be found in the care plan section) Progress towards PT goals: Progressing toward goals    Frequency  Min 3X/week    PT Plan Current plan remains appropriate;Frequency needs to be updated    Co-evaluation             End of Session Equipment Utilized During Treatment: Back brace Activity Tolerance: Patient limited by fatigue Patient left: with call  bell/phone within reach;with family/visitor present;in bed     Time: 1350-1416 PT Time Calculation (min) (ACUTE ONLY): 26 min  Charges:  $Gait Training: 23-37 mins                    G Codes:      Reginia Naas 2016-04-20, 6:06 PM Magda Kiel, [place] 2016-04-20

## 2016-03-31 NOTE — Progress Notes (Signed)
Speech Language Pathology Treatment: Dysphagia  Patient Details Name: [name] MRN: [id number] DOB: 1922-08-20 Today's Date: 03/31/2016 Time: [id number] SLP Time Calculation (min) (ACUTE ONLY): 20 min  Assessment / Plan / Recommendation Clinical Impression  Pt alert and sitting on side of bed. No s/s with pills whole in yogurt with RN. Immediate cough and wet vocal quality with thin via straw prevented this session with small cup sips. Functional mastication with solid. Recommend upgrade to regular with pt's improvement in alertness and strength, continue thin via cup (no straw). Follow up x 1. Daughter present.   HPI HPI: Pt is a 82 y.o. female with PMH chronic atrial fibrillation on Coumadin, cardiomyopathy with LVEF 30-35 percent, chronic combined systolic and diastolic CHF, CAD, MR, TR, prolonged QT, LBBB, carotid disease, HTN, chronic thrombocytopenia, hypothyroid, HLD, GERD and lumbar stenosis, underwent elective lumbar decompression of L3-4, L4-5 on 7/12 and postop course complicated by postop surgical site hematoma, acute blood loss anemia and hypotension. CXR 7/12 showed L lung base consolidation likely indicating postoperative atelectasis with PNA considered less likely. Pt reportedly becomes somnolent after having Percocet. Bedside swallow eval ordered to assist in ruling out aspiration.       SLP Plan  Continue with current plan of care     Recommendations  Diet recommendations: Regular;Thin liquid Liquids provided via: Cup;No straw Medication Administration: Whole meds with puree Supervision: Patient able to self feed;Full supervision/cueing for compensatory strategies Compensations: Slow rate;Small sips/bites Postural Changes and/or Swallow Maneuvers: Seated upright 90 degrees             Oral Care Recommendations: Oral care BID Follow up Recommendations: Skilled Nursing facility Plan: Continue with current plan of care     GO                Houston Siren 03/31/2016, 10:59 AM  Orbie Pyo Colvin Caroli.[employer] [phone]

## 2016-03-31 NOTE — Care Management Important Message (Signed)
Important Message  Patient Details  Name: [name] MRN: [id number] Date of Birth: 04/26/1922   Medicare Important Message Given:  Yes    Loann Quill 03/31/2016, 10:05 AM

## 2016-03-31 NOTE — Progress Notes (Signed)
Nutrition Follow-up   INTERVENTION:  Provide Mighty Shake once daily with breakfast Recommend liberalizing diet to Regular, pt only eating 50% and would benefit from more food choices Assist in planning meals   NUTRITION DIAGNOSIS:   Inadequate oral intake related to poor appetite as evidenced by meal completion < 25%, per patient/family report.  Ongoing  GOAL:   Patient will meet greater than or equal to 90% of their needs  Unmet  MONITOR:   PO intake, Supplement acceptance, Skin, Weight trends, Labs, I & O's  REASON FOR ASSESSMENT:   Consult Poor PO (per family request)  ASSESSMENT:   82 yo female s/p L3-4 laminectomy. PMH: CAD, GERD, diverticulosis, arthritis, anemnia, HTn, Osteoporosis, OA, LBBB, COPD . Post op ABL anemia s/p 2 units of PRBCs.   Per daughter at bedside, pt drank 50% of a Mighty Shake at breakfast, but has not tried other supplements yet. Daughter reports that patient is eating better today, about 50% of meals. Pt was re-assessed by SLP and advanced to a Heart Healthy diet. RD assisted patient in ordering next 3 meals per daughter's request. Pt would benefit from being on a regular diet to allow more food choices.   Labs: low hemoglobin, low calcium  Diet Order:  Diet Heart Room service appropriate?: Yes; Fluid consistency:: Thin  Skin:  Reviewed, no issues (closed incision on back)  Last BM:  7/19  Height:   Ht Readings from Last 1 Encounters:  03/23/16 5\' 7"  (1.702 m)    Weight:   Wt Readings from Last 1 Encounters:  03/31/16 141 lb 3.2 oz (64.048 kg)    Ideal Body Weight:  61.36 kg  BMI:  Body mass index is 22.11 kg/(m^2).  Estimated Nutritional Needs:   Kcal:  1300-1500  Protein:  80-90 grams  Fluid:  1.5 L/day  EDUCATION NEEDS:   No education needs identified at this time  [place], LDN Inpatient Clinical Dietitian Pager: [phone] After Hours Pager: [phone]

## 2016-03-31 NOTE — Progress Notes (Signed)
Occupational Therapy Treatment Patient Details Name: [name] MRN: [id number] DOB: 06-07-1922 Today's Date: 03/31/2016    History of present illness 82 yo female s/p L3-4 laminectomy. uti (+) pending workup for chest xray ordered 7/19/17PMH: CAD, GERD, diverticulosis, arthritis, anemnia, HTn, Osteoporosis, OA, LBBB, COPD .  Post op ABL anemia s/p 2 units of PRBCs.     OT comments  Pt progressing toward goals this session and the most alert since admission during OT session. Pt known to therapist and demonstrates visual attention to all task this session. Pt able to briskly respond to questions but does report continued pain. Pt reports pain is much less than yesterday .    Follow Up Recommendations  SNF    Equipment Recommendations  3 in 1 bedside comode;Wheelchair (measurements OT);Wheelchair cushion (measurements OT);Hospital bed    Recommendations for Other Services Other (comment)    Precautions / Restrictions Precautions Precautions: Back;Fall Precaution Comments: unable to recall any precautions Required Braces or Orthoses: Spinal Brace Spinal Brace: Lumbar corset;Applied in sitting position Restrictions Weight Bearing Restrictions: No       Mobility Bed Mobility Overal bed mobility: Needs Assistance Bed Mobility: Rolling;Supine to Sit Rolling: Min guard   Supine to sit: Min guard     General bed mobility comments: pt able to push up using bed rail with proper body alignment. pt able to scoot to eob with incr time  Transfers Overall transfer level: Needs assistance Equipment used: Rolling walker (2 wheeled) Transfers: Sit to/from Stand Sit to Stand: Min assist         General transfer comment: cues for back precautions but able to complete with one person to 3n1 at bedside    Balance Overall balance assessment: Needs assistance Sitting-balance support: Bilateral upper extremity supported;Feet supported Sitting balance-Leahy Scale: Fair      Standing balance support: During functional activity;Bilateral upper extremity supported Standing balance-Leahy Scale: Fair                     ADL Overall ADL's : Needs assistance/impaired   Eating/Feeding Details (indicate cue type and reason): small amount of PO intake just prior to arrival         Lower Body Bathing: Total assistance;Sit to/from stand Lower Body Bathing Details (indicate cue type and reason): peri care s/p toilet transfer Upper Body Dressing : Minimal assistance;Sitting Upper Body Dressing Details (indicate cue type and reason): pt requires total (A) for don of brace. Pt did ask for the brace showing awareness      Toilet Transfer: Minimal assistance;BSC;RW;Stand-pivot Toilet Transfer Details (indicate cue type and reason): pt able to complete transfer prior to voiding and remained continent Toileting- Clothing Manipulation and Hygiene: Total assistance         General ADL Comments: Pt transferred from 3n1 to chair and allowed to sit up. pt with pending chest xray per transport arriving during session. Daughter very relieved this session and reports "mother you are doing better" Rn called to room to help address daughters questions about pending xray      Vision                 Additional Comments: pt visually attending the entire session   Perception     Praxis      Cognition   Behavior During Therapy: Flat affect Overall Cognitive Status: Impaired/Different from baseline Area of Impairment: Attention;Safety/judgement;Awareness;Memory   Current Attention Level: Sustained Memory: Decreased recall of precautions;Decreased short-term memory    Safety/Judgement:  Decreased awareness of safety;Decreased awareness of deficits Awareness: Emergent Problem Solving: Slow processing;Difficulty sequencing General Comments: Pt more alert, maintained eyes open throughouts session, pt able to answer questions briskly and reports "they tried to  get me up last night but I told them I have to have that brace" Pt advocating for her personal care which she has not demonstrated in previous sessions. Pt continues to report deficits with voiding     Extremity/Trunk Assessment               Exercises     Shoulder Instructions       General Comments      Pertinent Vitals/ Pain       Pain Assessment: 0-10 Pain Score: 5  Pain Location: back Pain Descriptors / Indicators: Constant Pain Intervention(s): Monitored during session;Premedicated before session;Repositioned  Home Living                                          Prior Functioning/Environment              Frequency Min 2X/week     Progress Toward Goals  OT Goals(current goals can now be found in the care plan section)  Progress towards OT goals: Progressing toward goals  Acute Rehab OT Goals Patient Stated Goal: daughter wants [facility] placement OT Goal Formulation: With patient Time For Goal Achievement: 04/07/16 Potential to Achieve Goals: Good ADL Goals Pt Will Perform Grooming: with set-up;sitting Pt Will Perform Upper Body Bathing: with set-up;sitting Pt Will Perform Toileting - Clothing Manipulation and hygiene: with min assist;sit to/from stand Additional ADL Goal #1: Pt will complete bed mobility min (A) as precursor to adls.  Plan Discharge plan remains appropriate    Co-evaluation                 End of Session     Activity Tolerance Patient tolerated treatment well   Patient Left in chair;with call bell/phone within reach;with chair alarm set;with family/visitor present   Nurse Communication Mobility status;Precautions        Time: [id number] OT Time Calculation (min): 26 min  Charges: OT General Charges $OT Visit: 1 Procedure OT Treatments $Self Care/Home Management : 23-37 mins  Parke Poisson B 03/31/2016, 10:14 AM  Jeri Modena   OTR/L Pager: [phone] Office:  [phone] .

## 2016-03-31 NOTE — Care Management Note (Signed)
Case Management Note  Patient Details  Name: [name] MRN: [id number] Date of Birth: 1922-05-25  Subjective/Objective:   82 y.o. F s/p L3-4 Laminectomy and UTI work-up. CSW is following for SNF placement at discharge per PT/OT recommendations.                  Action/Plan:CM will sign off for now but will be available should additional discharge needs arise or disposition change.    Expected Discharge Date:                  Expected Discharge Plan:  Skilled Nursing Facility  In-House Referral:  Clinical Social Work  Discharge planning Services  CM Consult  Post Acute Care Choice:    Choice offered to:     DME Arranged:    DME Agency:     HH Arranged:    [place] Agency:     Status of Service:  Completed, signed off  If discussed at [employer], dates discussed:    Additional Comments:  Delrae Sawyers, RN 03/31/2016, 10:18 AM

## 2016-03-31 NOTE — Progress Notes (Signed)
PROGRESS NOTE  [name]  [name] DOB: [birth year]  DOA: 03/23/2016 PCP: Loura Pardon, MD  Primary Cardiologist: Dr. Ida Rogue  Brief Narrative:  82 year old female patient with a PMH of chronic atrial fibrillation on Coumadin, cardiomyopathy with LVEF 30-35 percent, chronic combined systolic and diastolic CHF, CAD, MR, TR, prolonged QT, LBBB, carotid disease, HTN, chronic thrombocytopenia, hypothyroid, HLD, GERD and lumbar stenosis, underwent elective lumbar decompression of L3-4, L4-5 on 7/12 and postop course complicated by postop surgical site hematoma, acute blood loss anemia and hypotension. Hospitalists were consulted on 7/12 for assistance. Cardiology had consulted. Slow and poor postop progress. Intermittent mental status changes. Dyspnea on 7/18-possibly due to pulmonary edema.   Assessment & Plan:   Principal Problem:   Acute blood loss anemia Active Problems:   Essential hypertension   Cardiomyopathy, ischemic   Chronic atrial fibrillation (HCC)   Chronic systolic CHF (congestive heart failure) (HCC)   Lumbar stenosis with neurogenic claudication   Surgery, elective   Arterial hypotension   Acute on chronic systolic congestive heart failure (HCC)   Lumbar stenosis with neurogenic claudication - Status post surgical decompression of L3-4, 4-5 on 7/12. - Still has her surgical site drain with some blood draining- probably decreased and plans for removing drain 7/14. Management per primary service. - Recommend minimizing opioid analgesics as much as possible to avoid sedation & ongoing intermittent confusion.   Postop hematoma/acute blood loss anemia - In the setting of Lovenox (now held). - Hemoglobin dropped from 11.9 g preop to 8 g postop and down to 7.2 g on 7/13 - Status post 2 units PRBC on 7/13. Hemoglobin stable in the 9 g range since 7/14.  Chronic thrombocytopenia - Preop platelets 122 on 7/3. Platelet count up to 150  Essential hypertension -  Controlled. Continue lisinopril and metoprolol.  Chronic atrial fibrillation - Patient was on Coumadin prior to surgery. - CHA2DS2 VASc score of 7. - Postop, was being bridged with Lovenox and then developed postop hematoma with acute blood loss anemia. Lovenox held. - Restart anticoagulation when okay with neurosurgery and as per cardiology, may restart Coumadin without Lovenox/heparin bridging.  Acute on Chronic systolic CHF/ischemic cardiomyopathy - LVEF 30-35 percent. Seems compensated and euvolemic.  - Lasix had been held postoperatively due to poor oral intake, felt to be clinically euvolemic even though weight was up and asymptomatic of dyspnea. - On 7/18, noted to be dyspneic. Although chest x-ray suggested basilar atelectasis versus infiltrate, no productive cough, high fever or leukocytosis and pro-calcitonin not elevated. Good response with a dose of IV Lasix 40 mg 1. - Reassess in a.m. with chest x-ray and consider Lasix as needed.  CAD - Asymptomatic of chest pain. Cardiology had seen  Atelectasis  - Likely secondary to poor inspiratory effort related to pain and sedation. Minimize sedative medications and encourage out of bed and incentive spirometry - discussed repeatedly with patient's daughter.  Hypotension/orthostatic hypotension - Continue midodrine. Blood pressures have stabilized.  Hypothyroid. - Continue Synthroid.  Stage III chronic kidney disease/hyperkalemia - Creatinine normal at 1.10.  Difficulty voiding/urinary retention - Patient has had difficulty voiding and had to have in and out catheterization done 3 times in the last 24 hours. Discussed with RN extensively. Mobilize out of bed to chair and may continue when necessary in and out cath. Trying to avoid indwelling Foley catheter. Minimize pain medications. - Seems to have resolved for now.  Possible UTI - Patient complained of dysuria this morning. Urine microscopy: Many bacteria, negative  nitrites,  too numerous to count RBCs and WBCs (some of the findings may be due to in and out Foley catheter trauma) - Due to dysuria, confusion which may be related to UTI, initiated IV Rocephin pending urine culture results. - Urine culture suggests contamination. Complete course of antibiotics. Likely D/C in AM if bed ready with abx for infection.   DVT prophylaxis: SCDs Code Status: DO NOT RESUSCITATE Family Communication: Discussed in detail with patient's daughter at bedside.  Disposition Plan: To be determined. Not medically stable for discharge.    Consultants:   [facility] - are consultants  Cardiology   Procedures:   L3-4, L4-5 lumbar decompression surgery on 03/24/16.  In and out urinary catheterization  Antimicrobials:   Rocephin 7/17>   Subjective: Doing well. No SOB. No chest pain reported. No complaints.  Objective:  Filed Vitals:   03/30/16 2143 03/31/16 0126 03/31/16 0506 03/31/16 1038  BP: 125/77 162/88 132/80 142/70  Pulse: 75 85 78 87  Temp: 98 F (36.7 C) 97.9 F (36.6 C) 97.9 F (36.6 C) 97.9 F (36.6 C)  TempSrc: Oral Oral Oral Oral  Resp: 20 24 20 19   Height:      Weight:   64.048 kg (141 lb 3.2 oz)   SpO2: 96% 99% 96% 100%    Intake/Output Summary (Last 24 hours) at 03/31/16 1122 Last data filed at 03/30/16 2000  Gross per 24 hour  Intake    840 ml  Output      0 ml  Net    840 ml   Filed Weights   03/28/16 0456 03/29/16 0408 03/31/16 0506  Weight: 65.318 kg (144 lb) 65.318 kg (144 lb) 64.048 kg (141 lb 3.2 oz)    Examination:  General exam: Elderly female, small built and frail. Respiratory system: Reduced breath sounds no rales or crackles. Nl WOB.  Cardiovascular system: S1 & S2 heard, irregularly irregular. No JVD, rubs, gallops or clicks. No pedal edema. Systolic ejection murmur grade 3 x 6 best heard at apex. Gastrointestinal system: Abdomen is nondistended, soft and nontender. No organomegaly or masses felt. Normal bowel  sounds heard. Central nervous system: Alert and oriented  to self and place . No focal neurological deficits. Extremities: Moves all limbs symmetrically Skin: No rashes, lesions or ulcers Psychiatry: Pleasant and appropriate.    Data Reviewed: I have personally reviewed following labs and imaging studies  CBC:  Recent Labs Lab 03/26/16 0347 03/27/16 0448 03/29/16 0557 03/30/16 0357 03/31/16 0334  WBC 8.2 8.4 7.6 7.8 8.6  HGB 9.2* 9.3* 9.9* 9.6* 10.2*  HCT 28.4* 28.1* 30.9* 30.4* 32.2*  MCV 96.6 95.6 96.6 97.4 97.3  PLT 70* 95* 138* 150 0000000   Basic Metabolic Panel:  Recent Labs Lab 03/25/16 1825 03/26/16 0347 03/30/16 0357 03/31/16 0334  NA 133* 135 137 139  K 4.1 4.2 4.1 3.9  CL 105 105 105 102  CO2 22 24 22 29   GLUCOSE 146* 105* 84 98  BUN 21* 24* 26* 25*  CREATININE 1.19* 1.10* 1.04* 1.21*  CALCIUM 8.3* 8.4* 8.6* 8.8*   GFR: Estimated Creatinine Clearance: 28.8 mL/min (by C-G formula based on Cr of 1.21). Liver Function Tests: No results for input(s): AST, ALT, ALKPHOS, BILITOT, PROT, ALBUMIN in the last 168 hours. No results for input(s): LIPASE, AMYLASE in the last 168 hours. No results for input(s): AMMONIA in the last 168 hours. Coagulation Profile:  Recent Labs Lab 03/31/16 0334  INR 1.28   Cardiac Enzymes: No results for  input(s): CKTOTAL, CKMB, CKMBINDEX, TROPONINI in the last 168 hours. BNP (last 3 results) No results for input(s): PROBNP in the last 8760 hours. HbA1C: No results for input(s): HGBA1C in the last 72 hours. CBG: No results for input(s): GLUCAP in the last 168 hours. Lipid Profile: No results for input(s): CHOL, HDL, LDLCALC, TRIG, CHOLHDL, LDLDIRECT in the last 72 hours. Thyroid Function Tests: No results for input(s): TSH, T4TOTAL, FREET4, T3FREE, THYROIDAB in the last 72 hours. Anemia Panel: No results for input(s): VITAMINB12, FOLATE, FERRITIN, TIBC, IRON, RETICCTPCT in the last 72 hours.  Sepsis Labs:  Recent  Labs Lab 03/30/16 1438  PROCALCITON 0.10    Recent Results (from the past 240 hour(s))  OB Urine Culture     Status: Abnormal   Collection Time: 03/29/16 10:10 AM  Result Value Ref Range Status   Specimen Description OB CLEAN CATCH  Final   Special Requests Normal  Final   Culture (A)  Final    MULTIPLE SPECIES PRESENT, SUGGEST RECOLLECTION NO GROUP B STREP (S.AGALACTIAE) ISOLATED    Report Status 03/30/2016 FINAL  Final         Radiology Studies: Dg Chest 2 View  03/31/2016  CLINICAL DATA:  Status post lumbar laminectomy with shortness of breath, congestive heart failure and weakness. EXAM: CHEST  2 VIEW COMPARISON:  03/30/2016 FINDINGS: There is stable moderate cardiac enlargement. Aeration of both lower lungs appears improved. There are small bilateral pleural effusions. Interstitial edema remains but is less prominent. IMPRESSION: Less prominent interstitial edema with improved aeration at both lung bases. Small bilateral pleural effusions are present. Electronically Signed   By: Aletta Edouard M.D.   On: 03/31/2016 10:43   Dg Chest Port 1 View  03/30/2016  CLINICAL DATA:  Shortness of Breath EXAM: PORTABLE CHEST 1 VIEW COMPARISON:  03/24/2016 FINDINGS: Cardiomegaly again noted. No pulmonary edema. Mild perihilar bronchitic changes. There is small bilateral pleural effusion with bilateral basilar atelectasis or infiltrate. Atherosclerotic calcifications of thoracic aorta again noted IMPRESSION: Cardiomegaly. Small bilateral pleural effusion with bilateral basilar atelectasis or infiltrate. Mild perihilar bronchitic changes. Electronically Signed   By: Lahoma Crocker M.D.   On: 03/30/2016 12:13        Scheduled Meds: . antiseptic oral rinse  7 mL Mouth Rinse BID  . cefTRIAXone (ROCEPHIN)  IV  1 g Intravenous Q24H  . docusate sodium  100 mg Oral BID  . feeding supplement  1 Container Oral Q24H  . ferrous sulfate  325 mg Oral Q lunch  . lactose free nutrition  237 mL Oral Q24H   . levothyroxine  88 mcg Oral QAC breakfast  . lisinopril  2.5 mg Oral QHS  . metoprolol tartrate  25 mg Oral BID  . midodrine  5 mg Oral TID WC  . multivitamin with minerals  1 tablet Oral Daily  . polyethylene glycol  17 g Oral Daily  . senna  1 tablet Oral BID  . simvastatin  10 mg Oral q1800  . sodium chloride flush  3 mL Intravenous Q12H   Continuous Infusions: . sodium chloride    . lactated ringers 10 mL/hr at 03/23/16 1100     LOS: 8 days    Time spent: 40 minutes.    Elwin Mocha, MD [facility] Pager [phone]  If 7PM-7AM, please contact night-coverage [email or web address] Password [email or web address] 03/31/2016, 11:22 AM

## 2016-03-31 NOTE — Progress Notes (Signed)
ANTICOAGULATION CONSULT NOTE - Initial Consult  Pharmacy Consult for coumadin Indication: atrial fibrillation  Allergies  Allergen Reactions  . Sulfonamide Derivatives Nausea And Vomiting    REACTION: sick  . Alendronate Sodium Other (See Comments)    REACTION: GI    Patient Measurements: Height: 5' 7"  (170.2 cm) Weight: 141 lb 3.2 oz (64.048 kg) IBW/kg (Calculated) : 61.6   Vital Signs: Temp: 97.9 F (36.6 C) (07/19 1038) Temp Source: Oral (07/19 1038) BP: 142/70 mmHg (07/19 1038) Pulse Rate: 87 (07/19 1038)  Labs:  Recent Labs  03/29/16 0557 03/30/16 0357 03/31/16 0334  HGB 9.9* 9.6* 10.2*  HCT 30.9* 30.4* 32.2*  PLT 138* 150 179  LABPROT  --   --  16.1*  INR  --   --  1.28  CREATININE  --  1.04* 1.21*    Estimated Creatinine Clearance: 28.8 mL/min (by C-G formula based on Cr of 1.21).   Medical History: Past Medical History  Diagnosis Date  . Chronic atrial fibrillation (HCC)     a. on Coumadin (CHA2DS2VASc = 6); b. Holter 06/2010, no brady, mild tachy.  . CAD (coronary artery disease)     a. 2 DES, [facility], 2004; b. nuc 2013 septal dyssenergy 2/2 LBBB, nl LV fxn;  c. 06/2015 MV: EF 41%, septal HK (LBBB), no ischemia->Low risk.  . Pleural effusion associated with pulmonary infection 03-2007  . Hypothyroid   . Hyperlipidemia   . GERD (gastroesophageal reflux disease)   . Diverticulosis of colon   . Allergy   . Anemia   . Arthritis     osteo  . Hypertension   . Mitral regurgitation   . Vitamin B deficiency   . Compression fracture of lumbar vertebra ([place]) 05-2008  . Osteoporosis   . Thrombocytopenia ([place])   . OA (osteoarthritis)   . LBBB (left bundle branch block)   . Hx of colonoscopy   . Warfarin anticoagulation   . TR (tricuspid regurgitation)     moderate, echo 2008  . Dyslipidemia   . Lung abnormality     Dr [name] 122/2011 no further w/u needed  . Prolonged QT interval     when on sotolol in past  . Essential hypertension   . Weight loss      August, 2012  . Nausea     Some nausea with medications, May, 2013,  . Orthostatic hypotension     June, 2014  . Shingles 2014  . Ischemic cardiomyopathy     a. 03/2015 Echo: EF 30-35%, ant, antsept HK, mod MR, mildly dil LA/RV, mod dil RA, mod-sev TR, PASP 19mHg.  .Marland KitchenHematemesis     a. 03/2015.  . Pulmonary hypertension ([place]     a. 03/2015 PASP 564mg by echo.  . Family history of adverse reaction to anesthesia     daughter PONV  . COPD [place]ch[place] obstructive pulmonary disease) ([place]  [facility]    "m[facility]"  . Wears dentures   . Urinary frequency   . Nocturia   . Wears glasses   . Hard of hearing     Medications:  Prescriptions prior to admission  Medication Sig Dispense Refill Last Dose  . calcium carbonate (OS-CAL) 600 MG TABS tablet Take 1,200 mg by mouth every evening.    Past Month at Unknown time  . enoxaparin (LOVENOX) 60 MG/0.6ML injection Inject 0.6 mLs (60 mg total) into the skin every 12 (twelve) hours. 3.6 mL 1 03/22/2016 at 2015  .  ferrous sulfate 325 (65 FE) MG tablet Take 325 mg by mouth daily. With lunch   Past Month at Unknown time  . furosemide (LASIX) 20 MG tablet Take 1 tablet (20 mg total) by mouth daily as needed. (Patient taking differently: Take 20 mg by mouth daily as needed for fluid. Daily weight check) 90 tablet 3 Taking  . Glucosamine HCl 500 MG TABS Take 500 mg by mouth daily.    Past Month at Unknown time  . levothyroxine (SYNTHROID, LEVOTHROID) 88 MCG tablet Take 1 tablet (88 mcg total) by mouth daily. 90 tablet 1 03/23/2016 at 0700  . lisinopril (PRINIVIL,ZESTRIL) 2.5 MG tablet TAKE ONE TABLET AT BEDTIME (8PM) (Patient taking differently: Take 2.5 mg by mouth at bedtime. BEDTIME (8PM)) 90 tablet 3 03/22/2016 at Unknown time  . Lutein 6 MG CAPS Take 1 capsule by mouth daily.    Past Month at Unknown time  . metoprolol tartrate (LOPRESSOR) 25 MG tablet Take 1 tablet (25 mg total) by mouth 2 (two) times daily. 180  tablet 3 03/23/2016 at 0700  . midodrine (PROAMATINE) 5 MG tablet TAKE ONE TABLET BY MOUTH TWICE DAILY WITH A MEAL 180 tablet 3 03/23/2016 at 0700  . mirtazapine (REMERON) 15 MG tablet Take 1 tablet (15 mg total) by mouth at bedtime. 90 tablet 3 03/22/2016 at Unknown time  . Multiple Vitamin (MULTIVITAMIN) tablet Take 1 tablet by mouth daily.    Past Month at Unknown time  . pantoprazole (PROTONIX) 40 MG tablet Take 1 tablet (40 mg total) by mouth daily. 90 tablet 1 03/23/2016 at 0700  . potassium chloride (K-DUR,KLOR-CON) 10 MEQ tablet Take 1 tablet (10 mEq total) by mouth daily. (Patient taking differently: Take 10 mEq by mouth every other day. ) 90 tablet 1 Past Month at Unknown time  . senna-docusate (SENOKOT-S) 8.6-50 MG per tablet Take 1 tablet by mouth at bedtime.   Past Month at Unknown time  . simvastatin (ZOCOR) 20 MG tablet TAKE 1/2 TABLET  (10MG  TOTAL) EVERY EVENING (Patient taking differently: Take 5 mg by mouth every evening. ) 45 tablet 1 03/22/2016 at Unknown time  . vitamin B-12 (CYANOCOBALAMIN) 1000 MCG tablet Take 1 tablet (1,000 mcg total) by mouth daily. 30 tablet 0 Past Month at Unknown time  . warfarin (COUMADIN) 5 MG tablet Take as directed by anti-coagulation clinic. (Patient taking differently: Take 5 mg by mouth daily at 6 PM. Take 1 tablet 17m all days except on Monday and Friday take 7.551m 120 tablet 0 03/18/2016 at Unknown time  . Cyanocobalamin 1000 MCG/ML KIT Inject 1 mL as directed every 8 (eight) weeks. (Patient not taking: Reported on 03/11/2016) 1 kit 0 Taking  . fluticasone (FLONASE) 50 MCG/ACT nasal spray Place 2 sprays into both nostrils daily. (Patient taking differently: Place 2 sprays into both nostrils as needed. ) 16 g 11 Taking    Assessment: [age]o F on coumadin PTA (7.5 mg MF, 5 mg ROW) for AFib w/ elective lumbar decompression 7/1/61omplicated post-op by surgical site hematoma, acute blood loss anemia, and hypotension.  7/18: Pharmacy has been consulted to  resume coumadin dosing for afib, coumadin 2.5 mg ordered by Dr BoJoya Salmnd given at 1735 7/19: Begin coumadin per Rx  Goal of Therapy:  INR 2-3   Plan:  -Coumadin per Rx starting 7/19 -Coumadin 3 mg x1 -Daily INR, monitor S/Sx bleeding  MiArrie SenatePharmD PGY-1 Pharmacy Resident Pager: [phone]/19/2017  I discussed / reviewed the pharmacy note by Dr.  Bitoni and I agree with the resident's findings and plans as documented.  Thank you Anette Guarneri, PharmD [phone]

## 2016-04-01 ENCOUNTER — Inpatient Hospital Stay (HOSPITAL_COMMUNITY): Payer: PPO

## 2016-04-01 DIAGNOSIS — R0602 Shortness of breath: Secondary | ICD-10-CM | POA: Diagnosis not present

## 2016-04-01 DIAGNOSIS — I482 Chronic atrial fibrillation: Secondary | ICD-10-CM | POA: Diagnosis not present

## 2016-04-01 DIAGNOSIS — I5023 Acute on chronic systolic (congestive) heart failure: Secondary | ICD-10-CM | POA: Diagnosis not present

## 2016-04-01 DIAGNOSIS — D62 Acute posthemorrhagic anemia: Secondary | ICD-10-CM | POA: Diagnosis not present

## 2016-04-01 LAB — CBC
HEMATOCRIT: 33.5 % — AB (ref 36.0–46.0)
HEMOGLOBIN: 10.7 g/dL — AB (ref 12.0–15.0)
MCH: 31.1 pg (ref 26.0–34.0)
MCHC: 31.9 g/dL (ref 30.0–36.0)
MCV: 97.4 fL (ref 78.0–100.0)
Platelets: 220 10*3/uL (ref 150–400)
RBC: 3.44 MIL/uL — ABNORMAL LOW (ref 3.87–5.11)
RDW: 14 % (ref 11.5–15.5)
WBC: 7.6 10*3/uL (ref 4.0–10.5)

## 2016-04-01 LAB — BASIC METABOLIC PANEL
ANION GAP: 9 (ref 5–15)
BUN: 16 mg/dL (ref 6–20)
CALCIUM: 8.7 mg/dL — AB (ref 8.9–10.3)
CO2: 26 mmol/L (ref 22–32)
CREATININE: 1.02 mg/dL — AB (ref 0.44–1.00)
Chloride: 101 mmol/L (ref 101–111)
GFR calc non Af Amer: 46 mL/min — ABNORMAL LOW (ref >60)
GFR, EST AFRICAN AMERICAN: 54 mL/min — AB (ref >60)
Glucose, Bld: 138 mg/dL — ABNORMAL HIGH (ref 65–99)
Potassium: 3.6 mmol/L (ref 3.5–5.1)
SODIUM: 136 mmol/L (ref 135–145)

## 2016-04-01 LAB — PROCALCITONIN

## 2016-04-01 LAB — PROTIME-INR
INR: 1.16 (ref 0.00–1.49)
PROTHROMBIN TIME: 15 s (ref 11.6–15.2)

## 2016-04-01 MED ORDER — WARFARIN SODIUM 3 MG PO TABS
3.0000 mg | ORAL_TABLET | Freq: Once | ORAL | Status: AC
  Administered 2016-04-01: 3 mg via ORAL
  Filled 2016-04-01: qty 1

## 2016-04-01 MED ORDER — SALINE SPRAY 0.65 % NA SOLN
1.0000 | NASAL | Status: DC | PRN
  Filled 2016-04-01: qty 44

## 2016-04-01 MED ORDER — FUROSEMIDE 20 MG PO TABS
20.0000 mg | ORAL_TABLET | Freq: Once | ORAL | Status: AC
  Administered 2016-04-01: 20 mg via ORAL
  Filled 2016-04-01: qty 1

## 2016-04-01 MED ORDER — ONDANSETRON 4 MG PO TBDP
2.0000 mg | ORAL_TABLET | Freq: Once | ORAL | Status: AC
  Administered 2016-04-01: 2 mg via ORAL
  Filled 2016-04-01: qty 1

## 2016-04-01 NOTE — Clinical Social Work Note (Signed)
Patient not medically ready for discharge per physician, CSW to continue to follow patient's progress.  Patient to be discharged to [facility] once patient is medically ready for discharge and orders have been received.  Jones Broom. Rose Bud, MSW, [place] 04/01/2016 6:31 PM

## 2016-04-01 NOTE — Progress Notes (Signed)
PROGRESS NOTE  [name]  [name] DOB: 09/28/1921  DOA: 03/23/2016 PCP: Loura Pardon, MD  Primary Cardiologist: Dr. Ida Rogue  Brief Narrative:  82 year old female patient with a PMH of chronic atrial fibrillation on Coumadin, cardiomyopathy with LVEF 30-35 percent, chronic combined systolic and diastolic CHF, CAD, MR, TR, prolonged QT, LBBB, carotid disease, HTN, chronic thrombocytopenia, hypothyroid, HLD, GERD and lumbar stenosis, underwent elective lumbar decompression of L3-4, L4-5 on 7/12 and postop course complicated by postop surgical site hematoma, acute blood loss anemia and hypotension. Hospitalists were consulted on 7/12 for assistance. Cardiology had consulted. Slow and poor postop progress. Intermittent mental status changes initially but better now. Dyspnea on 7/18-possibly due to pulmonary edema. Resolving.  Pt likely ready for d/c in AM.   Assessment & Plan:   Principal Problem:   Acute blood loss anemia Active Problems:   Essential hypertension   Cardiomyopathy, ischemic   Chronic atrial fibrillation (HCC)   Chronic systolic CHF (congestive heart failure) (HCC)   Lumbar stenosis with neurogenic claudication   Surgery, elective   Arterial hypotension   Acute on chronic systolic congestive heart failure (HCC)   Lumbar stenosis with neurogenic claudication - Status post surgical decompression of L3-4, 4-5 on 7/12. - Still has her surgical site drain with some blood draining- probably decreased and plans for removing drain 7/14. Management per primary service. - Recommend minimizing opioid analgesics as much as possible to avoid sedation & ongoing intermittent confusion.  - Spoke to physician oncall for Dr. Harley Hallmark group approved taking dressing down.  Postop hematoma/acute blood loss anemia - In the setting of Lovenox (now held). - Hemoglobin dropped from 11.9 g preop to 8 g postop and down to 7.2 g on 7/13 - Status post 2 units PRBC on 7/13.  Hemoglobin stable in the 9 g range since 7/14.  Chronic thrombocytopenia - Preop platelets 122 on 7/3. Platelet count up to 150  Essential hypertension - Controlled. Continue lisinopril and metoprolol.  Chronic atrial fibrillation - Patient was on Coumadin prior to surgery. - CHA2DS2 VASc score of 7. - Postop, was being bridged with Lovenox and then developed postop hematoma with acute blood loss anemia. Lovenox held. - Restart anticoagulation when okay with neurosurgery and as per cardiology, may restart Coumadin without Lovenox/heparin bridging.  Acute on Chronic systolic CHF/ischemic cardiomyopathy - LVEF 30-35 percent. Seems compensated and euvolemic.  - Lasix had been held postoperatively due to poor oral intake, felt to be clinically euvolemic even though weight was up and asymptomatic of dyspnea. - On 7/18, noted to be dyspneic. Although chest x-ray suggested basilar atelectasis versus infiltrate, no productive cough, high fever or leukocytosis and pro-calcitonin not elevated. Good response with a dose of IV Lasix 40 mg 1. - Reassess in a.m. with chest x-ray and consider Lasix as needed. - 7/20 Xr shows improvement  CAD - Asymptomatic of chest pain. Cardiology had seen  Atelectasis  - Likely secondary to poor inspiratory effort related to pain and sedation. Minimize sedative medications and encourage out of bed and incentive spirometry - discussed repeatedly with patient's daughter.  Hypotension/orthostatic hypotension - Continue midodrine. Blood pressures have stabilized.  Hypothyroid. - Continue Synthroid.  Stage III chronic kidney disease/hyperkalemia - Creatinine normal at 1.10.  Difficulty voiding/urinary retention - Patient has had difficulty voiding and had to have in and out catheterization done 3 times in the last 24 hours. Discussed with RN extensively. Mobilize out of bed to chair and may continue when necessary in and out cath.  Trying to avoid indwelling Foley  catheter. Minimize pain medications. - Seems to have resolved for now.  Possible UTI - Patient complained of dysuria this morning. Urine microscopy: Many bacteria, negative nitrites, too numerous to count RBCs and WBCs (some of the findings may be due to in and out Foley catheter trauma) - Due to dysuria, confusion which may be related to UTI, initiated IV Rocephin pending urine culture results. - Urine culture suggests contamination. Complete course of antibiotics. Likely D/C in AM if bed ready with abx for infection.   DVT prophylaxis: SCDs Code Status: DO NOT RESUSCITATE Family Communication: Discussed in detail with patient's daughter at bedside.  Disposition Plan: To be determined. Not medically stable for discharge.    Consultants:   [facility] - are consultants  Cardiology   Procedures:   L3-4, L4-5 lumbar decompression surgery on 03/24/16.  In and out urinary catheterization  Antimicrobials:   Rocephin 7/17>   Subjective: Doing well. No SOB. No chest pain reported. No complaints.  Objective:  Filed Vitals:   04/01/16 0950 04/01/16 1355 04/01/16 1357 04/01/16 1753  BP: 150/70 168/94 157/97 158/85  Pulse: 88 84  89  Temp: 98.7 F (37.1 C) 98.6 F (37 C)  99.6 F (37.6 C)  TempSrc: Axillary Oral  Oral  Resp: 17 17  17   Height:      Weight:      SpO2: 99% 97%  95%   No intake or output data in the 24 hours ending 04/01/16 1801 Filed Weights   03/28/16 0456 03/29/16 0408 03/31/16 0506  Weight: 65.318 kg (144 lb) 65.318 kg (144 lb) 64.048 kg (141 lb 3.2 oz)    Examination:  General exam: Elderly female, small built and frail. Respiratory system: Reduced breath sounds no rales or crackles. Nl WOB.  Cardiovascular system: S1 & S2 heard, irregularly irregular. No JVD, rubs, gallops or clicks. No pedal edema. Systolic ejection murmur grade 3 x 6 best heard at apex. Gastrointestinal system: Abdomen is nondistended, soft and nontender. No organomegaly  or masses felt. Normal bowel sounds heard. Central nervous system: Alert and oriented  to self and place . No focal neurological deficits. Extremities: Moves all limbs symmetrically Skin: No rashes, lesions or ulcers Psychiatry: Pleasant and appropriate.    Data Reviewed: I have personally reviewed following labs and imaging studies  CBC:  Recent Labs Lab 03/27/16 0448 03/29/16 0557 03/30/16 0357 03/31/16 0334 04/01/16 1007  WBC 8.4 7.6 7.8 8.6 7.6  HGB 9.3* 9.9* 9.6* 10.2* 10.7*  HCT 28.1* 30.9* 30.4* 32.2* 33.5*  MCV 95.6 96.6 97.4 97.3 97.4  PLT 95* 138* 150 179 [id number]   Basic Metabolic Panel:  Recent Labs Lab 03/25/16 1825 03/26/16 0347 03/30/16 0357 03/31/16 0334 04/01/16 1007  NA 133* 135 137 139 136  K 4.1 4.2 4.1 3.9 3.6  CL 105 105 105 102 101  CO2 22 24 22 29 26   GLUCOSE 146* 105* 84 98 138*  BUN 21* 24* 26* 25* 16  CREATININE 1.19* 1.10* 1.04* 1.21* 1.02*  CALCIUM 8.3* 8.4* 8.6* 8.8* 8.7*   GFR: Estimated Creatinine Clearance: 34.2 mL/min (by C-G formula based on Cr of 1.02). Liver Function Tests: No results for input(s): AST, ALT, ALKPHOS, BILITOT, PROT, ALBUMIN in the last 168 hours. No results for input(s): LIPASE, AMYLASE in the last 168 hours. No results for input(s): AMMONIA in the last 168 hours. Coagulation Profile:  Recent Labs Lab 03/31/16 0334 04/01/16 0544  INR 1.28 1.16   Cardiac Enzymes:  No results for input(s): CKTOTAL, CKMB, CKMBINDEX, TROPONINI in the last 168 hours. BNP (last 3 results) No results for input(s): PROBNP in the last 8760 hours. HbA1C: No results for input(s): HGBA1C in the last 72 hours. CBG: No results for input(s): GLUCAP in the last 168 hours. Lipid Profile: No results for input(s): CHOL, HDL, LDLCALC, TRIG, CHOLHDL, LDLDIRECT in the last 72 hours. Thyroid Function Tests: No results for input(s): TSH, T4TOTAL, FREET4, T3FREE, THYROIDAB in the last 72 hours. Anemia Panel:  Recent Labs  03/31/16 1822    FERRITIN 498*  TIBC 231*  IRON 42    Sepsis Labs:  Recent Labs Lab 03/30/16 1438 04/01/16 0544  PROCALCITON 0.10 <0.10    Recent Results (from the past 240 hour(s))  OB Urine Culture     Status: Abnormal   Collection Time: 03/29/16 10:10 AM  Result Value Ref Range Status   Specimen Description OB CLEAN CATCH  Final   Special Requests Normal  Final   Culture (A)  Final    MULTIPLE SPECIES PRESENT, SUGGEST RECOLLECTION NO GROUP B STREP (S.AGALACTIAE) ISOLATED    Report Status 03/30/2016 FINAL  Final         Radiology Studies: Dg Chest 2 View  03/31/2016  CLINICAL DATA:  Status post lumbar laminectomy with shortness of breath, congestive heart failure and weakness. EXAM: CHEST  2 VIEW COMPARISON:  03/30/2016 FINDINGS: There is stable moderate cardiac enlargement. Aeration of both lower lungs appears improved. There are small bilateral pleural effusions. Interstitial edema remains but is less prominent. IMPRESSION: Less prominent interstitial edema with improved aeration at both lung bases. Small bilateral pleural effusions are present. Electronically Signed   By: Aletta Edouard M.D.   On: 03/31/2016 10:43   Dg Chest Port 1 View  04/01/2016  CLINICAL DATA:  Shortness of breath today. EXAM: PORTABLE CHEST 1 VIEW COMPARISON:  03/31/2016. FINDINGS: Stable enlarged cardiac silhouette. Increased left basilar linear and patchy opacity. Mildly improved linear density at the right lung base. No significant change in bilateral pleural effusions. Mildly prominent pulmonary vasculature with improvement. Slight increase in prominence of the interstitial markings. Diffuse osteopenia and left shoulder degenerative changes. Lower thoracic spine kyphoplasty material. IMPRESSION: 1. Interval mild interstitial pulmonary edema with stable cardiomegaly and mildly improved pulmonary vascular congestion. 2. Stable small bilateral pleural effusions. 3. Mildly increased left basilar atelectasis and mildly  improved right basilar atelectasis. Electronically Signed   By: Claudie Revering M.D.   On: 04/01/2016 11:13        Scheduled Meds: . antiseptic oral rinse  7 mL Mouth Rinse BID  . docusate sodium  100 mg Oral BID  . feeding supplement  1 Container Oral Q24H  . ferrous sulfate  325 mg Oral Q lunch  . levothyroxine  88 mcg Oral QAC breakfast  . lisinopril  2.5 mg Oral QHS  . metoprolol tartrate  25 mg Oral BID  . midodrine  5 mg Oral TID WC  . multivitamin with minerals  1 tablet Oral Daily  . polyethylene glycol  17 g Oral Daily  . senna  1 tablet Oral BID  . simvastatin  10 mg Oral q1800  . sodium chloride flush  3 mL Intravenous Q12H  . Warfarin - Pharmacist Dosing Inpatient   Does not apply q1800   Continuous Infusions: . sodium chloride    . lactated ringers 10 mL/hr at 03/23/16 1100     LOS: 9 days    Time spent: 40 minutes.  Elwin Mocha, MD [facility] Pager [phone]  If 7PM-7AM, please contact night-coverage [email or web address] Password [email or web address] 04/01/2016, 6:01 PM

## 2016-04-01 NOTE — Progress Notes (Signed)
Speech Language Pathology Treatment: Dysphagia  Patient Details Name: [name] MRN: [id number] DOB: 08-19-1922 Today's Date: 04/01/2016 Time: 1245-8099 SLP Time Calculation (min) (ACUTE ONLY): 8 min  Assessment / Plan / Recommendation Clinical Impression  Pt upright in chair, complaining of increased back pain this morning; did not observe with po's. Daughter present who reported pt masticated upgraded regular texture without difficulty; no coughing with food or liquids. Continue regular and thin liquids. No further ST needed.    HPI HPI: Pt is a 82 y.o. female with PMH chronic atrial fibrillation on Coumadin, cardiomyopathy with LVEF 30-35 percent, chronic combined systolic and diastolic CHF, CAD, MR, TR, prolonged QT, LBBB, carotid disease, HTN, chronic thrombocytopenia, hypothyroid, HLD, GERD and lumbar stenosis, underwent elective lumbar decompression of L3-4, L4-5 on 7/12 and postop course complicated by postop surgical site hematoma, acute blood loss anemia and hypotension. CXR 7/12 showed L lung base consolidation likely indicating postoperative atelectasis with PNA considered less likely. Pt reportedly becomes somnolent after having Percocet. Bedside swallow eval ordered to assist in ruling out aspiration.       SLP Plan  All goals met;Discharge SLP treatment due to (comment)     Recommendations  Diet recommendations: Regular;Thin liquid Liquids provided via: Cup;No straw Medication Administration: Whole meds with puree Supervision: Patient able to self feed Compensations: Slow rate;Small sips/bites Postural Changes and/or Swallow Maneuvers: Seated upright 90 degrees             Oral Care Recommendations: Oral care BID Follow up Recommendations: Skilled Nursing facility Plan: All goals met;Discharge SLP treatment due to (comment)                    [place] Siren 04/01/2016, 9:54 AM   Orbie Pyo Colvin Caroli.[employer] [phone]

## 2016-04-01 NOTE — Progress Notes (Signed)
Patient ID: [name], female   DOB: [birth year], 82 y.o.   MRN: [id number] C/o lumbar pain. No weakness

## 2016-04-01 NOTE — Progress Notes (Signed)
ANTICOAGULATION CONSULT NOTE - Follow up  Pharmacy Consult for coumadin Indication: atrial fibrillation  Allergies  Allergen Reactions  . Sulfonamide Derivatives Nausea And Vomiting    REACTION: sick  . Alendronate Sodium Other (See Comments)    REACTION: GI    Patient Measurements: Height: 5' 7"  (170.2 cm) Weight: 141 lb 3.2 oz (64.048 kg) IBW/kg (Calculated) : 61.6   Vital Signs: Temp: 98.7 F (37.1 C) (07/20 0950) Temp Source: Axillary (07/20 0950) BP: 150/70 mmHg (07/20 0950) Pulse Rate: 88 (07/20 0950)  Labs:  Recent Labs  03/30/16 0357 03/31/16 0334 04/01/16 0544 04/01/16 1007  HGB 9.6* 10.2*  --  10.7*  HCT 30.4* 32.2*  --  33.5*  PLT 150 179  --  220  LABPROT  --  16.1* 15.0  --   INR  --  1.28 1.16  --   CREATININE 1.04* 1.21*  --  1.02*    Estimated Creatinine Clearance: 34.2 mL/min (by C-G formula based on Cr of 1.02).   Medical History: Past Medical History  Diagnosis Date  . Chronic atrial fibrillation (HCC)     a. on Coumadin (CHA2DS2VASc = 6); b. Holter 06/2010, no brady, mild tachy.  . CAD (coronary artery disease)     a. 2 DES, [facility], 2004; b. nuc 2013 septal dyssenergy 2/2 LBBB, nl LV fxn;  c. 06/2015 MV: EF 41%, septal HK (LBBB), no ischemia->Low risk.  . Pleural effusion associated with pulmonary infection 03-2007  . Hypothyroid   . Hyperlipidemia   . GERD (gastroesophageal reflux disease)   . Diverticulosis of colon   . Allergy   . Anemia   . Arthritis     osteo  . Hypertension   . Mitral regurgitation   . Vitamin B deficiency   . Compression fracture of lumbar vertebra ([place]) 05-2008  . Osteoporosis   . Thrombocytopenia ([place])   . OA (osteoarthritis)   . LBBB (left bundle branch block)   . Hx of colonoscopy   . Warfarin anticoagulation   . TR (tricuspid regurgitation)     moderate, echo 2008  . Dyslipidemia   . Lung abnormality     Dr [name] 122/2011 no further w/u needed  . Prolonged QT interval     when on sotolol in past   . Essential hypertension   . Weight loss     August, 2012  . Nausea     Some nausea with medications, May, 2013,  . Orthostatic hypotension     June, 2014  . Shingles 2014  . Ischemic cardiomyopathy     a. 03/2015 Echo: EF 30-35%, ant, antsept HK, mod MR, mildly dil LA/RV, mod dil RA, mod-sev TR, PASP 47mHg.  .Marland KitchenHematemesis     a. 03/2015.  . [place]     a. 03/2015 PASP 550mg by echo.  . Family history of adverse reaction to anesthesia     daughter PONV  . COPD (chronic obstructive pulmonary disease) ([facility]  . [facility]    "mild heart attack"  . Wears dentures   . Urinary frequency   . Nocturia   . Wears glasses   . Hard of hearing     Medications:  Prescriptions prior to admission  Medication Sig Dispense Refill Last Dose  . calcium carbonate (OS-CAL) 600 MG TABS tablet Take 1,200 mg by mouth every evening.    Past Month at Unknown time  . enoxaparin (LOVENOX) 60 MG/0.6ML injection Inject 0.6 mLs (60 mg total) into  the skin every 12 (twelve) hours. 3.6 mL 1 03/22/2016 at 2015  . ferrous sulfate 325 (65 FE) MG tablet Take 325 mg by mouth daily. With lunch   Past Month at Unknown time  . furosemide (LASIX) 20 MG tablet Take 1 tablet (20 mg total) by mouth daily as needed. (Patient taking differently: Take 20 mg by mouth daily as needed for fluid. Daily weight check) 90 tablet 3 Taking  . Glucosamine HCl 500 MG TABS Take 500 mg by mouth daily.    Past Month at Unknown time  . levothyroxine (SYNTHROID, LEVOTHROID) 88 MCG tablet Take 1 tablet (88 mcg total) by mouth daily. 90 tablet 1 03/23/2016 at 0700  . lisinopril (PRINIVIL,ZESTRIL) 2.5 MG tablet TAKE ONE TABLET AT BEDTIME (8PM) (Patient taking differently: Take 2.5 mg by mouth at bedtime. BEDTIME (8PM)) 90 tablet 3 03/22/2016 at Unknown time  . Lutein 6 MG CAPS Take 1 capsule by mouth daily.    Past Month at Unknown time  . metoprolol tartrate (LOPRESSOR) 25 MG tablet Take 1 tablet (25 mg  total) by mouth 2 (two) times daily. 180 tablet 3 03/23/2016 at 0700  . midodrine (PROAMATINE) 5 MG tablet TAKE ONE TABLET BY MOUTH TWICE DAILY WITH A MEAL 180 tablet 3 03/23/2016 at 0700  . mirtazapine (REMERON) 15 MG tablet Take 1 tablet (15 mg total) by mouth at bedtime. 90 tablet 3 03/22/2016 at Unknown time  . Multiple Vitamin (MULTIVITAMIN) tablet Take 1 tablet by mouth daily.    Past Month at Unknown time  . pantoprazole (PROTONIX) 40 MG tablet Take 1 tablet (40 mg total) by mouth daily. 90 tablet 1 03/23/2016 at 0700  . potassium chloride (K-DUR,KLOR-CON) 10 MEQ tablet Take 1 tablet (10 mEq total) by mouth daily. (Patient taking differently: Take 10 mEq by mouth every other day. ) 90 tablet 1 Past Month at Unknown time  . senna-docusate (SENOKOT-S) 8.6-50 MG per tablet Take 1 tablet by mouth at bedtime.   Past Month at Unknown time  . simvastatin (ZOCOR) 20 MG tablet TAKE 1/2 TABLET  (10MG  TOTAL) EVERY EVENING (Patient taking differently: Take 5 mg by mouth every evening. ) 45 tablet 1 03/22/2016 at Unknown time  . vitamin B-12 (CYANOCOBALAMIN) 1000 MCG tablet Take 1 tablet (1,000 mcg total) by mouth daily. 30 tablet 0 Past Month at Unknown time  . warfarin (COUMADIN) 5 MG tablet Take as directed by anti-coagulation clinic. (Patient taking differently: Take 5 mg by mouth daily at 6 PM. Take 1 tablet 76m all days except on Monday and Friday take 7.588m 120 tablet 0 03/18/2016 at Unknown time  . Cyanocobalamin 1000 MCG/ML KIT Inject 1 mL as directed every 8 (eight) weeks. (Patient not taking: Reported on 03/11/2016) 1 kit 0 Taking  . fluticasone (FLONASE) 50 MCG/ACT nasal spray Place 2 sprays into both nostrils daily. (Patient taking differently: Place 2 sprays into both nostrils as needed. ) 16 g 11 Taking    Assessment: [age] F on coumadin PTA (7.5 mg MF, 5 mg ROW) for AFib w/ elective lumbar decompression 7/8/65omplicated post-op by surgical site hematoma, acute blood loss anemia, and  hypotension.  7/18: Pharmacy has been consulted to resume coumadin dosing for afib, coumadin 2.5 mg ordered by Dr BoJoya Salmnd given at 1735 7/19: Begin coumadin per pharmacy protocol  7/20: INR 1.16  PTA coumadin dose was 64m51maily EXCEPT 7.64mg64mon and Fri.-  Last taken PTA on 03/18/16. She was then taking LOVENOX 60mg60mq12h  leading up to surgery , last taken 03/22/16.   Status post 2 units PRBC on 7/13. Hemoglobin stable at 10.7 Chronic thrombocytopenia-- Preop platelets 122 on 7/3. Platelet count up to 220 today. No bleeding noted, no drain per RN.  82 y.o patient who had recent surgical site hematoma. I will continue dosing coumadin cautiously.   Goal of Therapy:  INR 2-3   Plan:  -Coumadin 3 mg x1 -Daily INR, monitor S/Sx bleeding  Thank you for allowing pharmacy to be part of this patients care team. Nicole Cella, RPh Clinical Pharmacist Pager: [phone] 04/01/2016

## 2016-04-01 NOTE — Progress Notes (Signed)
Pt c/o nausea, vomited clear liquid x1. Paged Md to request antiemetic. Awaiting call back.

## 2016-04-02 ENCOUNTER — Inpatient Hospital Stay (HOSPITAL_COMMUNITY): Payer: PPO

## 2016-04-02 DIAGNOSIS — E785 Hyperlipidemia, unspecified: Secondary | ICD-10-CM | POA: Diagnosis not present

## 2016-04-02 DIAGNOSIS — M4806 Spinal stenosis, lumbar region: Secondary | ICD-10-CM | POA: Diagnosis not present

## 2016-04-02 DIAGNOSIS — Z4889 Encounter for other specified surgical aftercare: Secondary | ICD-10-CM | POA: Diagnosis not present

## 2016-04-02 DIAGNOSIS — K219 Gastro-esophageal reflux disease without esophagitis: Secondary | ICD-10-CM | POA: Diagnosis not present

## 2016-04-02 DIAGNOSIS — F329 Major depressive disorder, single episode, unspecified: Secondary | ICD-10-CM | POA: Diagnosis not present

## 2016-04-02 DIAGNOSIS — I4891 Unspecified atrial fibrillation: Secondary | ICD-10-CM | POA: Diagnosis not present

## 2016-04-02 DIAGNOSIS — M4800 Spinal stenosis, site unspecified: Secondary | ICD-10-CM | POA: Diagnosis not present

## 2016-04-02 DIAGNOSIS — I10 Essential (primary) hypertension: Secondary | ICD-10-CM | POA: Diagnosis not present

## 2016-04-02 DIAGNOSIS — E039 Hypothyroidism, unspecified: Secondary | ICD-10-CM | POA: Diagnosis not present

## 2016-04-02 DIAGNOSIS — I482 Chronic atrial fibrillation: Secondary | ICD-10-CM | POA: Diagnosis not present

## 2016-04-02 DIAGNOSIS — D649 Anemia, unspecified: Secondary | ICD-10-CM | POA: Diagnosis not present

## 2016-04-02 DIAGNOSIS — M4126 Other idiopathic scoliosis, lumbar region: Secondary | ICD-10-CM | POA: Diagnosis not present

## 2016-04-02 DIAGNOSIS — I509 Heart failure, unspecified: Secondary | ICD-10-CM | POA: Diagnosis not present

## 2016-04-02 DIAGNOSIS — I5022 Chronic systolic (congestive) heart failure: Secondary | ICD-10-CM | POA: Diagnosis not present

## 2016-04-02 DIAGNOSIS — I2510 Atherosclerotic heart disease of native coronary artery without angina pectoris: Secondary | ICD-10-CM | POA: Diagnosis not present

## 2016-04-02 LAB — PROTIME-INR
INR: 1.24 (ref 0.00–1.49)
Prothrombin Time: 15.8 seconds — ABNORMAL HIGH (ref 11.6–15.2)

## 2016-04-02 MED ORDER — WARFARIN SODIUM 5 MG PO TABS: 5.0000 mg | ORAL_TABLET | Freq: Once | ORAL | Status: DC

## 2016-04-02 MED ORDER — LIDOCAINE 5 % EX PTCH: 1.0000 | MEDICATED_PATCH | Freq: Every day | CUTANEOUS | Status: DC | PRN

## 2016-04-02 NOTE — Discharge Summary (Signed)
Physician Discharge Summary  Patient ID: [name] MRN: [id number] DOB/AGE: 80/06/1922 80 y.o.  Admit date: 03/23/2016 Discharge date: 04/02/2016  Admission Diagnoses:lumbar stenosis.   Discharge Diagnoses:  Principal Problem:   Acute blood loss anemia Active Problems:   Essential hypertension   Cardiomyopathy, ischemic   Chronic atrial fibrillation (HCC)   Chronic systolic CHF (congestive heart failure) (HCC)   Lumbar stenosis with neurogenic claudication   Surgery, elective   Arterial hypotension   Acute on chronic systolic congestive heart failure ([place])   Discharged Condition:ambulating with help  Hospital Course: surgery. Acute blood loss secondary to anticoagulation./uti. atelectasis  Consults: cardiologist. hospitalist rehab medicine  Significant Diagnostic Studies: mri  Treatments: decompression of lumbar spine  Discharge Exam: Blood pressure 125/58, pulse 73, temperature 99.2 F (37.3 C), temperature source Oral, resp. rate 16, height 5' 7"  (1.702 m), weight 62.324 kg (137 lb 6.4 oz), SpO2 97 %. Wound dry. Some echymosis. No weakness. Ambulating with help  Disposition: SNF   TO SEE ME IN 4 WEEKS     Medication List    ASK your doctor about these medications        calcium carbonate 600 MG Tabs tablet  Commonly known as:  OS-CAL  Take 1,200 mg by mouth every evening.     enoxaparin 60 MG/0.6ML injection  Commonly known as:  LOVENOX  Inject 0.6 mLs (60 mg total) into the skin every 12 (twelve) hours.     ferrous sulfate 325 (65 FE) MG tablet  Take 325 mg by mouth daily. With lunch     fluticasone 50 MCG/ACT nasal spray  Commonly known as:  FLONASE  Place 2 sprays into both nostrils daily.     furosemide 20 MG tablet  Commonly known as:  LASIX  Take 1 tablet (20 mg total) by mouth daily as needed.     Glucosamine HCl 500 MG Tabs  Take 500 mg by mouth daily.     levothyroxine 88 MCG tablet  Commonly known as:  SYNTHROID, LEVOTHROID  Take  1 tablet (88 mcg total) by mouth daily.     lisinopril 2.5 MG tablet  Commonly known as:  PRINIVIL,ZESTRIL  TAKE ONE TABLET AT BEDTIME (8PM)     Lutein 6 MG Caps  Take 1 capsule by mouth daily.     metoprolol tartrate 25 MG tablet  Commonly known as:  LOPRESSOR  Take 1 tablet (25 mg total) by mouth 2 (two) times daily.     midodrine 5 MG tablet  Commonly known as:  PROAMATINE  TAKE ONE TABLET BY MOUTH TWICE DAILY WITH A MEAL     mirtazapine 15 MG tablet  Commonly known as:  REMERON  Take 1 tablet (15 mg total) by mouth at bedtime.     multivitamin tablet  Take 1 tablet by mouth daily.     pantoprazole 40 MG tablet  Commonly known as:  PROTONIX  Take 1 tablet (40 mg total) by mouth daily.     potassium chloride 10 MEQ tablet  Commonly known as:  K-DUR,KLOR-CON  Take 1 tablet (10 mEq total) by mouth daily.     senna-docusate 8.6-50 MG tablet  Commonly known as:  Senokot-S  Take 1 tablet by mouth at bedtime.     simvastatin 20 MG tablet  Commonly known as:  ZOCOR  TAKE 1/2 TABLET  (10MG  TOTAL) EVERY EVENING     vitamin B-12 1000 MCG tablet  Commonly known as:  CYANOCOBALAMIN  Take 1 tablet (1,000  mcg total) by mouth daily.     Cyanocobalamin 1000 MCG/ML Kit  Inject 1 mL as directed every 8 (eight) weeks.     warfarin 5 MG tablet  Commonly known as:  COUMADIN  Take as directed by anti-coagulation clinic.           Follow-up Information    Follow up with [facility] .   Specialty:  [place] information:   [place] [phone]      Signed: Floyce Stakes 04/02/2016, 11:09 AM

## 2016-04-02 NOTE — Progress Notes (Signed)
[place] for coumadin Indication: atrial fibrillation  Allergies  Allergen Reactions  . Sulfonamide Derivatives Nausea And Vomiting    REACTION: sick  . Alendronate Sodium Other (See Comments)    REACTION: GI    Patient Measurements: Height: 5\' 7"  (170.2 cm) Weight: 137 lb 6.4 oz (62.324 kg) IBW/kg (Calculated) : 61.6   Vital Signs: Temp: 99.2 F (37.3 C) (07/21 0929) Temp Source: Oral (07/21 0929) BP: 125/58 mmHg (07/21 0929) Pulse Rate: 73 (07/21 0929)  Labs:  Recent Labs  03/31/16 0334 04/01/16 0544 04/01/16 1007 04/02/16 0635  HGB 10.2*  --  10.7*  --   HCT 32.2*  --  33.5*  --   PLT 179  --  220  --   LABPROT 16.1* 15.0  --  15.8*  INR 1.28 1.16  --  1.24  CREATININE 1.21*  --  1.02*  --     Estimated Creatinine Clearance: 34.2 mL/min (by C-G formula based on Cr of 1.02).  Assessment:  82 yo F on coumadin PTA (7.5 mg MF, 5 mg ROW) for AFib w/ elective lumbar decompression [id number] complicated post-op by surgical site hematoma, acute blood loss anemia, and hypotension.  INR today = 1.24  Goal of Therapy:  INR 2-3   Plan:  Coumadin 5 mg po x 1 today Daily INR, monitor S/Sx bleeding  Thank you Anette Guarneri, PharmD [phone]  04/02/2016

## 2016-04-02 NOTE — Clinical Social Work Note (Addendum)
CSW called and left voicemail to start insurance authorization for discharge to [facility] today.  Dayton Scrape, [place] [phone]  3:37 pm Auth number: [id number]  Dayton Scrape, [place]

## 2016-04-02 NOTE — Progress Notes (Signed)
Staples removed and sterile strips placed.

## 2016-04-02 NOTE — Clinical Social Work Note (Signed)
CSW facilitated patient discharge including contacting patient family and facility to confirm patient discharge plans. Clinical information faxed to facility and family agreeable with plan. CSW arranged ambulance transport via [facility] to [facility]. RN to call report prior to discharge [phone]).  CSW will sign off for now as social work intervention is no longer needed. Please consult [place] again if new needs arise.  Dayton Scrape, [name]

## 2016-04-02 NOTE — Progress Notes (Signed)
Patient is being dc to a skilled nursing facility. Report called to the receiving nurse Butch Penny. Awaiting transportation.

## 2016-04-02 NOTE — Care Management Important Message (Signed)
Important Message  Patient Details  Name: [name] MRN: [id number] Date of Birth: 1922-02-01   Medicare Important Message Given:  Yes    Kiyara Bouffard, [place] 04/02/2016, 8:21 AM

## 2016-04-06 DIAGNOSIS — I10 Essential (primary) hypertension: Secondary | ICD-10-CM | POA: Diagnosis not present

## 2016-04-06 DIAGNOSIS — I2510 Atherosclerotic heart disease of native coronary artery without angina pectoris: Secondary | ICD-10-CM | POA: Diagnosis not present

## 2016-04-06 DIAGNOSIS — I4891 Unspecified atrial fibrillation: Secondary | ICD-10-CM | POA: Diagnosis not present

## 2016-04-06 DIAGNOSIS — D649 Anemia, unspecified: Secondary | ICD-10-CM | POA: Diagnosis not present

## 2016-04-06 DIAGNOSIS — M4800 Spinal stenosis, site unspecified: Secondary | ICD-10-CM | POA: Diagnosis not present

## 2016-04-07 NOTE — Anesthesia Postprocedure Evaluation (Signed)
Anesthesia Post Note  Patient: [name]  Procedure(s) Performed: Procedure(s) (LRB): Lumbar three-four Laminectomy/Foraminotomy, posterior laterial arthrodesis (N/A)  Patient location during evaluation: PACU Anesthesia Type: General Level of consciousness: awake and alert Pain management: pain level controlled Vital Signs Assessment: post-procedure vital signs reviewed and stable Respiratory status: spontaneous breathing, nonlabored ventilation, respiratory function stable and patient connected to nasal cannula oxygen Cardiovascular status: blood pressure returned to baseline and stable Postop Assessment: no signs of nausea or vomiting Anesthetic complications: no    Last Vitals:  Vitals:   04/02/16 0929 04/02/16 1407  BP: (!) 125/58 118/66  Pulse: 73 66  Resp: 16 20  Temp: 37.3 C 36.6 C    Last Pain:  Vitals:   04/02/16 1407  TempSrc: Oral  PainSc:                  Tiajuana Amass

## 2016-04-13 DIAGNOSIS — E039 Hypothyroidism, unspecified: Secondary | ICD-10-CM | POA: Diagnosis not present

## 2016-04-13 DIAGNOSIS — E785 Hyperlipidemia, unspecified: Secondary | ICD-10-CM | POA: Diagnosis not present

## 2016-04-13 DIAGNOSIS — F329 Major depressive disorder, single episode, unspecified: Secondary | ICD-10-CM | POA: Diagnosis not present

## 2016-04-13 DIAGNOSIS — Z4889 Encounter for other specified surgical aftercare: Secondary | ICD-10-CM | POA: Diagnosis not present

## 2016-04-13 DIAGNOSIS — I482 Chronic atrial fibrillation: Secondary | ICD-10-CM | POA: Diagnosis not present

## 2016-04-13 DIAGNOSIS — D649 Anemia, unspecified: Secondary | ICD-10-CM | POA: Diagnosis not present

## 2016-04-13 DIAGNOSIS — K219 Gastro-esophageal reflux disease without esophagitis: Secondary | ICD-10-CM | POA: Diagnosis not present

## 2016-04-13 DIAGNOSIS — M4806 Spinal stenosis, lumbar region: Secondary | ICD-10-CM | POA: Diagnosis not present

## 2016-04-13 DIAGNOSIS — M4126 Other idiopathic scoliosis, lumbar region: Secondary | ICD-10-CM | POA: Diagnosis not present

## 2016-04-13 DIAGNOSIS — I10 Essential (primary) hypertension: Secondary | ICD-10-CM | POA: Diagnosis not present

## 2016-04-13 DIAGNOSIS — I5022 Chronic systolic (congestive) heart failure: Secondary | ICD-10-CM | POA: Diagnosis not present

## 2016-04-20 ENCOUNTER — Telehealth: Payer: Self-pay | Admitting: Family Medicine

## 2016-04-20 NOTE — Telephone Encounter (Signed)
appt scheduled (you had a 19min slot), daughter notified to have pt keep taking the lasix.  [employer] and they said they checked pt's INR every Thursday, last INR was checked on 04/15/16. It was 1.7, per Dr. Glori Bickers she wants pt to have another INR check this week she doesn't want her to wait until next week, called daughter back and scheduled INR for this Thursday per Dr. Glori Bickers she is to stay on the 5mg  until her INR appt, daughter verbalized understanding

## 2016-04-20 NOTE — Telephone Encounter (Signed)
[name] called to make [name] a discharged appointment from [facility] she was discharged 04/19/16. Discharged paperwork stated to be seen with 7-10 days.  You next 30 min appointment is 8/28.  Can pt be worked in sooner.  [name] stated they have her on 5mg  of comudian everyday.  They have got her on 20mg  of lasix every day. [name] has ? About this.  She see dr Joya Salm on 04/26/16 dr Rockey Situ on 8/21   [name] would like a call back about her medication  She is concerned best number [phone]

## 2016-04-20 NOTE — Telephone Encounter (Signed)
Left voicemail requesting pt's daughter to call office back 

## 2016-04-20 NOTE — Telephone Encounter (Signed)
Pt daughter returned your call please call back  Thanks

## 2016-04-20 NOTE — Telephone Encounter (Signed)
You can put her in a 4:15 slot if one is open for hosp f/u  She needs an INR when able unless it was done at SNF Continue lasix until I see her and we will see if she still needs it

## 2016-04-21 ENCOUNTER — Telehealth: Payer: Self-pay | Admitting: Family Medicine

## 2016-04-21 DIAGNOSIS — I447 Left bundle-branch block, unspecified: Secondary | ICD-10-CM | POA: Diagnosis not present

## 2016-04-21 DIAGNOSIS — D6832 Hemorrhagic disorder due to extrinsic circulating anticoagulants: Secondary | ICD-10-CM | POA: Diagnosis not present

## 2016-04-21 DIAGNOSIS — J449 Chronic obstructive pulmonary disease, unspecified: Secondary | ICD-10-CM | POA: Diagnosis not present

## 2016-04-21 DIAGNOSIS — Z4789 Encounter for other orthopedic aftercare: Secondary | ICD-10-CM | POA: Diagnosis not present

## 2016-04-21 DIAGNOSIS — Z5181 Encounter for therapeutic drug level monitoring: Secondary | ICD-10-CM | POA: Diagnosis not present

## 2016-04-21 DIAGNOSIS — T45515D Adverse effect of anticoagulants, subsequent encounter: Secondary | ICD-10-CM | POA: Diagnosis not present

## 2016-04-21 DIAGNOSIS — D62 Acute posthemorrhagic anemia: Secondary | ICD-10-CM | POA: Diagnosis not present

## 2016-04-21 DIAGNOSIS — M1991 Primary osteoarthritis, unspecified site: Secondary | ICD-10-CM | POA: Diagnosis not present

## 2016-04-21 DIAGNOSIS — I2510 Atherosclerotic heart disease of native coronary artery without angina pectoris: Secondary | ICD-10-CM | POA: Diagnosis not present

## 2016-04-21 DIAGNOSIS — Z87310 Personal history of (healed) osteoporosis fracture: Secondary | ICD-10-CM | POA: Diagnosis not present

## 2016-04-21 DIAGNOSIS — Z7901 Long term (current) use of anticoagulants: Secondary | ICD-10-CM | POA: Diagnosis not present

## 2016-04-21 DIAGNOSIS — M810 Age-related osteoporosis without current pathological fracture: Secondary | ICD-10-CM | POA: Diagnosis not present

## 2016-04-21 DIAGNOSIS — I5023 Acute on chronic systolic (congestive) heart failure: Secondary | ICD-10-CM | POA: Diagnosis not present

## 2016-04-21 DIAGNOSIS — I959 Hypotension, unspecified: Secondary | ICD-10-CM | POA: Diagnosis not present

## 2016-04-21 DIAGNOSIS — I110 Hypertensive heart disease with heart failure: Secondary | ICD-10-CM | POA: Diagnosis not present

## 2016-04-21 DIAGNOSIS — Z981 Arthrodesis status: Secondary | ICD-10-CM | POA: Diagnosis not present

## 2016-04-21 DIAGNOSIS — Z955 Presence of coronary angioplasty implant and graft: Secondary | ICD-10-CM | POA: Diagnosis not present

## 2016-04-21 DIAGNOSIS — I482 Chronic atrial fibrillation: Secondary | ICD-10-CM | POA: Diagnosis not present

## 2016-04-21 NOTE — Telephone Encounter (Signed)
I'm fine with that if no s/s of heart failure  Also if they can do INR-that is fine as well thanks

## 2016-04-21 NOTE — Telephone Encounter (Signed)
Ria Comment from [facility] called-   pt is taking lasix everyday, wants to know if she can take it prn instead.  Pt is not drinking as much as when she was in rehab. Pt is showing signs of dehydration.   Wants to know if she can have pt/inr at home instead of coming into the office.   cb number is [phone]

## 2016-04-21 NOTE — Telephone Encounter (Signed)
Left Ria Comment and voicemail letting her know Dr. Marliss Coots comments, and also if they can do her INR this week then it's okay for them to do it.

## 2016-04-22 ENCOUNTER — Other Ambulatory Visit: Payer: PPO

## 2016-04-22 ENCOUNTER — Telehealth: Payer: Self-pay | Admitting: *Deleted

## 2016-04-22 DIAGNOSIS — J449 Chronic obstructive pulmonary disease, unspecified: Secondary | ICD-10-CM | POA: Diagnosis not present

## 2016-04-22 DIAGNOSIS — M1991 Primary osteoarthritis, unspecified site: Secondary | ICD-10-CM | POA: Diagnosis not present

## 2016-04-22 DIAGNOSIS — D6832 Hemorrhagic disorder due to extrinsic circulating anticoagulants: Secondary | ICD-10-CM | POA: Diagnosis not present

## 2016-04-22 DIAGNOSIS — Z4789 Encounter for other orthopedic aftercare: Secondary | ICD-10-CM | POA: Diagnosis not present

## 2016-04-22 DIAGNOSIS — I482 Chronic atrial fibrillation: Secondary | ICD-10-CM | POA: Diagnosis not present

## 2016-04-22 DIAGNOSIS — I110 Hypertensive heart disease with heart failure: Secondary | ICD-10-CM | POA: Diagnosis not present

## 2016-04-22 DIAGNOSIS — M810 Age-related osteoporosis without current pathological fracture: Secondary | ICD-10-CM | POA: Diagnosis not present

## 2016-04-22 DIAGNOSIS — I447 Left bundle-branch block, unspecified: Secondary | ICD-10-CM | POA: Diagnosis not present

## 2016-04-22 DIAGNOSIS — I5023 Acute on chronic systolic (congestive) heart failure: Secondary | ICD-10-CM | POA: Diagnosis not present

## 2016-04-22 DIAGNOSIS — I2510 Atherosclerotic heart disease of native coronary artery without angina pectoris: Secondary | ICD-10-CM | POA: Diagnosis not present

## 2016-04-22 DIAGNOSIS — D62 Acute posthemorrhagic anemia: Secondary | ICD-10-CM | POA: Diagnosis not present

## 2016-04-22 DIAGNOSIS — T45515D Adverse effect of anticoagulants, subsequent encounter: Secondary | ICD-10-CM | POA: Diagnosis not present

## 2016-04-22 DIAGNOSIS — I959 Hypotension, unspecified: Secondary | ICD-10-CM | POA: Diagnosis not present

## 2016-04-22 NOTE — Telephone Encounter (Signed)
Please change to 5 mg daily except 7.5 on thurs, Sat, and Monday Re check in about a week

## 2016-04-22 NOTE — Telephone Encounter (Signed)
Daughter notified of new doses and verbalized understanding. Pt has her hospital f/u on 04/28/16 so we can check it here on that day

## 2016-04-22 NOTE — Telephone Encounter (Signed)
Ria Comment (home health nurse) called she checked pt's INR and it was 1.5, pt takes 5mg  every day except on Monday and Friday's, and on Monday and Friday's she takes 7.5mg , Ria Comment request we call her daughter Windy Kalata with any new dosing changes

## 2016-04-26 DIAGNOSIS — I482 Chronic atrial fibrillation: Secondary | ICD-10-CM | POA: Diagnosis not present

## 2016-04-26 DIAGNOSIS — Z87310 Personal history of (healed) osteoporosis fracture: Secondary | ICD-10-CM | POA: Diagnosis not present

## 2016-04-26 DIAGNOSIS — Z4789 Encounter for other orthopedic aftercare: Secondary | ICD-10-CM | POA: Diagnosis not present

## 2016-04-26 DIAGNOSIS — M1991 Primary osteoarthritis, unspecified site: Secondary | ICD-10-CM | POA: Diagnosis not present

## 2016-04-26 DIAGNOSIS — I110 Hypertensive heart disease with heart failure: Secondary | ICD-10-CM | POA: Diagnosis not present

## 2016-04-26 DIAGNOSIS — Z7901 Long term (current) use of anticoagulants: Secondary | ICD-10-CM | POA: Diagnosis not present

## 2016-04-26 DIAGNOSIS — T45515D Adverse effect of anticoagulants, subsequent encounter: Secondary | ICD-10-CM | POA: Diagnosis not present

## 2016-04-26 DIAGNOSIS — Z955 Presence of coronary angioplasty implant and graft: Secondary | ICD-10-CM | POA: Diagnosis not present

## 2016-04-26 DIAGNOSIS — I5023 Acute on chronic systolic (congestive) heart failure: Secondary | ICD-10-CM | POA: Diagnosis not present

## 2016-04-26 DIAGNOSIS — D6832 Hemorrhagic disorder due to extrinsic circulating anticoagulants: Secondary | ICD-10-CM | POA: Diagnosis not present

## 2016-04-26 DIAGNOSIS — I447 Left bundle-branch block, unspecified: Secondary | ICD-10-CM | POA: Diagnosis not present

## 2016-04-26 DIAGNOSIS — Z5181 Encounter for therapeutic drug level monitoring: Secondary | ICD-10-CM | POA: Diagnosis not present

## 2016-04-26 DIAGNOSIS — D62 Acute posthemorrhagic anemia: Secondary | ICD-10-CM | POA: Diagnosis not present

## 2016-04-26 DIAGNOSIS — J449 Chronic obstructive pulmonary disease, unspecified: Secondary | ICD-10-CM | POA: Diagnosis not present

## 2016-04-26 DIAGNOSIS — M810 Age-related osteoporosis without current pathological fracture: Secondary | ICD-10-CM | POA: Diagnosis not present

## 2016-04-26 DIAGNOSIS — Z981 Arthrodesis status: Secondary | ICD-10-CM | POA: Diagnosis not present

## 2016-04-26 DIAGNOSIS — I959 Hypotension, unspecified: Secondary | ICD-10-CM | POA: Diagnosis not present

## 2016-04-26 DIAGNOSIS — I2510 Atherosclerotic heart disease of native coronary artery without angina pectoris: Secondary | ICD-10-CM | POA: Diagnosis not present

## 2016-04-28 ENCOUNTER — Ambulatory Visit (INDEPENDENT_AMBULATORY_CARE_PROVIDER_SITE_OTHER): Payer: PPO | Admitting: Family Medicine

## 2016-04-28 ENCOUNTER — Encounter: Payer: Self-pay | Admitting: Family Medicine

## 2016-04-28 ENCOUNTER — Other Ambulatory Visit (INDEPENDENT_AMBULATORY_CARE_PROVIDER_SITE_OTHER): Payer: PPO | Admitting: Family Medicine

## 2016-04-28 VITALS — BP 116/54 | HR 85 | Temp 98.0°F | Ht 67.0 in | Wt 117.1 lb

## 2016-04-28 DIAGNOSIS — J449 Chronic obstructive pulmonary disease, unspecified: Secondary | ICD-10-CM | POA: Diagnosis not present

## 2016-04-28 DIAGNOSIS — I5043 Acute on chronic combined systolic (congestive) and diastolic (congestive) heart failure: Secondary | ICD-10-CM | POA: Diagnosis not present

## 2016-04-28 DIAGNOSIS — T45515D Adverse effect of anticoagulants, subsequent encounter: Secondary | ICD-10-CM | POA: Diagnosis not present

## 2016-04-28 DIAGNOSIS — M810 Age-related osteoporosis without current pathological fracture: Secondary | ICD-10-CM | POA: Diagnosis not present

## 2016-04-28 DIAGNOSIS — I10 Essential (primary) hypertension: Secondary | ICD-10-CM | POA: Diagnosis not present

## 2016-04-28 DIAGNOSIS — Z7901 Long term (current) use of anticoagulants: Secondary | ICD-10-CM

## 2016-04-28 DIAGNOSIS — I2510 Atherosclerotic heart disease of native coronary artery without angina pectoris: Secondary | ICD-10-CM | POA: Diagnosis not present

## 2016-04-28 DIAGNOSIS — D6832 Hemorrhagic disorder due to extrinsic circulating anticoagulants: Secondary | ICD-10-CM | POA: Diagnosis not present

## 2016-04-28 DIAGNOSIS — Z87310 Personal history of (healed) osteoporosis fracture: Secondary | ICD-10-CM | POA: Diagnosis not present

## 2016-04-28 DIAGNOSIS — I5023 Acute on chronic systolic (congestive) heart failure: Secondary | ICD-10-CM | POA: Diagnosis not present

## 2016-04-28 DIAGNOSIS — Z955 Presence of coronary angioplasty implant and graft: Secondary | ICD-10-CM | POA: Diagnosis not present

## 2016-04-28 DIAGNOSIS — M4806 Spinal stenosis, lumbar region: Secondary | ICD-10-CM | POA: Diagnosis not present

## 2016-04-28 DIAGNOSIS — I872 Venous insufficiency (chronic) (peripheral): Secondary | ICD-10-CM

## 2016-04-28 DIAGNOSIS — M1991 Primary osteoarthritis, unspecified site: Secondary | ICD-10-CM | POA: Diagnosis not present

## 2016-04-28 DIAGNOSIS — I447 Left bundle-branch block, unspecified: Secondary | ICD-10-CM | POA: Diagnosis not present

## 2016-04-28 DIAGNOSIS — Z4789 Encounter for other orthopedic aftercare: Secondary | ICD-10-CM | POA: Diagnosis not present

## 2016-04-28 DIAGNOSIS — I959 Hypotension, unspecified: Secondary | ICD-10-CM | POA: Diagnosis not present

## 2016-04-28 DIAGNOSIS — Z5181 Encounter for therapeutic drug level monitoring: Secondary | ICD-10-CM | POA: Diagnosis not present

## 2016-04-28 DIAGNOSIS — D62 Acute posthemorrhagic anemia: Secondary | ICD-10-CM

## 2016-04-28 DIAGNOSIS — I482 Chronic atrial fibrillation, unspecified: Secondary | ICD-10-CM

## 2016-04-28 DIAGNOSIS — I110 Hypertensive heart disease with heart failure: Secondary | ICD-10-CM | POA: Diagnosis not present

## 2016-04-28 DIAGNOSIS — Z981 Arthrodesis status: Secondary | ICD-10-CM | POA: Diagnosis not present

## 2016-04-28 DIAGNOSIS — M48062 Spinal stenosis, lumbar region with neurogenic claudication: Secondary | ICD-10-CM

## 2016-04-28 LAB — COMPREHENSIVE METABOLIC PANEL
ALT: 8 U/L (ref 0–35)
AST: 19 U/L (ref 0–37)
Albumin: 4.1 g/dL (ref 3.5–5.2)
Alkaline Phosphatase: 57 U/L (ref 39–117)
BUN: 22 mg/dL (ref 6–23)
CHLORIDE: 104 meq/L (ref 96–112)
CO2: 30 mEq/L (ref 19–32)
Calcium: 9.7 mg/dL (ref 8.4–10.5)
Creatinine, Ser: 1.2 mg/dL (ref 0.40–1.20)
GFR: 44.6 mL/min — ABNORMAL LOW (ref >60.00)
GLUCOSE: 86 mg/dL (ref 70–99)
POTASSIUM: 5.5 meq/L — AB (ref 3.5–5.1)
SODIUM: 139 meq/L (ref 135–145)
Total Bilirubin: 0.7 mg/dL (ref 0.2–1.2)
Total Protein: 6.5 g/dL (ref 6.0–8.3)

## 2016-04-28 LAB — FERRITIN: FERRITIN: 395.8 ng/mL — AB (ref 10.0–291.0)

## 2016-04-28 LAB — CBC WITH DIFFERENTIAL/PLATELET
BASOS PCT: 0.5 % (ref 0.0–3.0)
Basophils Absolute: 0 10*3/uL (ref 0.0–0.1)
EOS PCT: 2.5 % (ref 0.0–5.0)
Eosinophils Absolute: 0.2 10*3/uL (ref 0.0–0.7)
HCT: 35.2 % — ABNORMAL LOW (ref 36.0–46.0)
HEMOGLOBIN: 11.9 g/dL — AB (ref 12.0–15.0)
Lymphocytes Relative: 17.1 % (ref 12.0–46.0)
Lymphs Abs: 1.4 10*3/uL (ref 0.7–4.0)
MCHC: 33.7 g/dL (ref 30.0–36.0)
MCV: 94.1 fl (ref 78.0–100.0)
MONO ABS: 0.9 10*3/uL (ref 0.1–1.0)
Monocytes Relative: 10.4 % (ref 3.0–12.0)
Neutro Abs: 5.7 10*3/uL (ref 1.4–7.7)
Neutrophils Relative %: 69.5 % (ref 43.0–77.0)
Platelets: 146 10*3/uL — ABNORMAL LOW (ref 150.0–400.0)
RBC: 3.74 Mil/uL — ABNORMAL LOW (ref 3.87–5.11)
RDW: 15.3 % (ref 11.5–15.5)
WBC: 8.3 10*3/uL (ref 4.0–10.5)

## 2016-04-28 LAB — POCT INR: INR: 2.6

## 2016-04-28 NOTE — Assessment & Plan Note (Signed)
Enc pt to elevate feet whenever sitting or lying since she cannot wear supp hose (intolerant) Will watch area of erythema near R ankle (vein is compressible w/o skin breakdown)

## 2016-04-28 NOTE — Assessment & Plan Note (Signed)
Currently only taking her metoprolol once daily due to bp in lower range (no dizziness) Lasix is prn/not often Lab today She will disc further with cardiology at f/u

## 2016-04-28 NOTE — Assessment & Plan Note (Signed)
INR is 2.6 today- therapeutic  Will continue 5 mg warfarin daily with the same amt of green veg daily  Re check 1 mo

## 2016-04-28 NOTE — Assessment & Plan Note (Signed)
S/p surgery (spine) and hematoma Pt is feeling better 2 U transfusion in hospital  Cbc today

## 2016-04-28 NOTE — Progress Notes (Signed)
Pre visit review using our clinic review tool, if applicable. No additional management support is needed unless otherwise documented below in the visit note. 

## 2016-04-28 NOTE — Patient Instructions (Addendum)
Stay at 5 mg of coumadin daily and we will re check it in a month  Cushion heels and feet at night to minimize pressure and elevate feet whenever sitting (looks like some some phlebitis)  Labs today for chemistries and blood count  Keep watching blood pressure

## 2016-04-28 NOTE — Assessment & Plan Note (Signed)
Pt did get fluid overloaded in hospital-now resolved Has only needed lasix once since then  wts have been stable

## 2016-04-28 NOTE — Assessment & Plan Note (Signed)
Much clinical improvement since decompression surgery with Dr Joya Salm Now walking for PT  Wearing brace 2 more weeks F/u planned

## 2016-04-28 NOTE — Progress Notes (Signed)
Subjective:    Patient ID: [name], female    DOB: [birth year], 82 y.o.   MRN: [id number]  HPI Here for f/u of hospitalization for back surgery   A whole lot better with her back !- happy with it Is now home from rehab- has hired a helper in [place] and pms- very helpful  Feels comfortable being alone now  Finished PT and now walking and using a few machines for arm strength  Uses a walker  Wearing back brace for a little while longer   bp has been labile BP Readings from Last 3 Encounters:  04/28/16 (!) 116/54  04/02/16 118/66  03/15/16 128/67   only taking metoprolol once daily in am due to low bp  Today is pretty good  Not dizzy when she stands up  Giving lasix only sparingly as needed (gave 1/2 pill yesterday because ankle was a little swollen)  Keeping fluids up  Still tired -slowly getting stronger   Also INR for coumadin (a fib) Had re started on coumadin after surgery Was 1.5 - then inc dose to 5 mg daily with 7.5 on thurs/sat/sun (per pt's caregiver they did not tell her)   Now on 5 mg daily  The same amt of green veg every day  INR is 2.6  Was hosp from 7/11 to 04/02/16 for back surgery (decompression of lumbar spine) with Dr Joya Salm Did develop anemia and hypotension and a post op hematoma with one episode of pulmonary edema that resolved  Had a 2 u transfusion on 7/13 Was d/c to [facility] for rehab    Chemistry      Component Value Date/Time   NA 136 04/01/2016 1007   NA 144 10/28/2011 1315   NA 144 04/15/2009 1032   K 3.6 04/01/2016 1007   K 4.5 10/28/2011 1315   K 5.7 (H) 04/15/2009 1032   CL 101 04/01/2016 1007   CL 106 10/28/2011 1315   CL 107 04/15/2009 1032   CO2 26 04/01/2016 1007   CO2 29 10/28/2011 1315   CO2 33 04/15/2009 1032   BUN 16 04/01/2016 1007   BUN 26 (H) 10/28/2011 1315   BUN 15 04/15/2009 1032   CREATININE 1.02 (H) 04/01/2016 1007   CREATININE 1.12 10/28/2011 1315   CREATININE 0.9 04/15/2009 1032      Component  Value Date/Time   CALCIUM 8.7 (L) 04/01/2016 1007   CALCIUM 8.8 10/28/2011 1315   CALCIUM 9.0 04/15/2009 1032   ALKPHOS 68 06/09/2015 1525   ALKPHOS 51 01/28/2009 1252   AST 21 06/09/2015 1525   AST 32 01/28/2009 1252   ALT 15 06/09/2015 1525   ALT 24 01/28/2009 1252   BILITOT 0.7 06/09/2015 1525   BILITOT 0.90 01/28/2009 1252      Lab Results  Component Value Date   WBC 7.6 04/01/2016   HGB 10.7 (L) 04/01/2016   HCT 33.5 (L) 04/01/2016   MCV 97.4 04/01/2016   PLT 220 04/01/2016   Lab Results  Component Value Date   TSH 3.756 04/08/2015    Was d/c on lasix for CHF and now holding it -no longer needs it   Patient Active Problem List   Diagnosis Date Noted  . Acute on chronic systolic congestive heart failure ([place])   . Acute blood loss anemia 03/24/2016  . Surgery, elective   . Arterial hypotension   . Lumbar stenosis with neurogenic claudication 03/23/2016  . Vitamin B12 deficiency 07/13/2015  . Adjustment disorder with mixed  anxiety and depressed mood 06/09/2015  . Generalized weakness 05/12/2015  . Chronic systolic CHF (congestive heart failure) ([place]) 04/29/2015  . Cardiomyopathy, ischemic   . Acute systolic CHF (congestive heart failure) ([place])   . Pulmonary hypertension ([place])   . Adult failure to thrive   . Chronic atrial fibrillation ([place])   . Unstable angina ([place]) 04/07/2015  . Right sided sciatica 10/25/2014  . Venous stasis dermatitis 06/19/2014  . Varicose veins of lower extremities with inflammation 03/25/2014  . Chronic diastolic CHF (congestive heart failure) ([place]) 01/04/2014  . Orthostatic hypotension   . Acute on chronic combined systolic and diastolic heart failure ([place]) 03/02/2013  . Acute renal failure ([place]) 02/28/2013  . Shingles 02/28/2013  . History of shingles 02/28/2013  . Nail fungus 01/10/2013  . Hyperlipidemia 07/09/2011  . Vision blurred 07/09/2011  . Hearing loss 07/09/2011  . Weight loss   . A-fib ([place])   . Tachycardia   . CAD  (coronary artery disease)   . Mitral regurgitation   . SOB (shortness of breath)   . LBBB (left bundle branch block)   . Warfarin anticoagulation   . Ejection fraction   . TR (tricuspid regurgitation)   . Dyslipidemia   . Lung abnormality   . Prolonged QT interval   . Coccydynia 12/03/2010  . Nonspecific (abnormal) findings on radiological and other examination of body structure 07/13/2010  . ABNORMAL CHEST XRAY 07/13/2010  . Shortness of breath 06/18/2010  . Essential hypertension 06/24/2009  . GOUT, UNSPECIFIED 03/20/2009  . GERD 03/18/2009  . THROMBOCYTOPENIA 01/03/2009  . ALLERGIC RHINITIS 12/05/2008  . HOARSENESS 06/07/2008  . MEDIASTINAL LYMPHADENOPATHY 05/13/2008  . Other diseases of lung, not elsewhere classified 04/18/2008  . Venous (peripheral) insufficiency 09/13/2007  . FREQUENCY, URINARY 07/19/2007  . VERTIGO, BENIGN PAROXYSMAL POSITION 05/11/2007  . TINNITUS NOS 05/11/2007  . OSTEOPOROSIS NOS 05/11/2007  . HYPOTHYROIDISM 04/11/2007  . ANEMIA, B12 DEFICIENCY 04/11/2007  . HYPOXEMIA 04/11/2007   Past Medical History:  Diagnosis Date  . Allergy   . Anemia   . Arthritis    osteo  . CAD (coronary artery disease)    a. 2 DES, [facility], 2004; b. nuc 2013 septal dyssenergy 2/2 LBBB, nl LV fxn;  c. 06/2015 MV: EF 41%, septal HK (LBBB), no ischemia->Low risk.  . Chronic atrial fibrillation (HCC)    a. on Coumadin (CHA2DS2VASc = 6); b. Holter 06/2010, no brady, mild tachy.  . Compression fracture of lumbar vertebra ([place]) 05-2008  . COPD (chronic obstructive pulmonary disease) ([place])   . Diverticulosis of colon   . Dyslipidemia   . Essential hypertension   . Family history of adverse reaction to anesthesia    daughter PONV  . GERD (gastroesophageal reflux disease)   . Hard of hearing   . Hematemesis    a. 03/2015.  [name] Hx of colonoscopy   . Hyperlipidemia   . Hypertension   . Hypothyroid   . Ischemic cardiomyopathy    a. 03/2015 Echo: EF 30-35%, ant, antsept HK, mod  MR, mildly dil LA/RV, mod dil RA, mod-sev TR, PASP 77mHg.  .Marland KitchenLBBB (left bundle branch block)   . Lung abnormality    Dr CGwenette Greet122/2011 no further w/u needed  . Mitral regurgitation   . Myocardial infarction ([facility] 2016   "mild heart attack"  . Nausea    Some nausea with medications, May, 2013,  . Nocturia   . OA (osteoarthritis)   . Orthostatic hypotension    June, 2014  . Osteoporosis   .  Pleural effusion associated with pulmonary infection 03-2007  . Prolonged QT interval    when on sotolol in past  . Pulmonary hypertension ([place])    a. 03/2015 PASP 63mHg by echo.  . Shingles 2014  . Thrombocytopenia ([place]   . TR (tricuspid regurgitation)    moderate, echo 2008  . Urinary frequency   . Vitamin B deficiency   . Warfarin anticoagulation   . Wears dentures   . Wears glasses   . Weight loss    August, 2012   Past Surgical History:  Procedure Laterality Date  . COLONOSCOPY    . CORONARY ANGIOPLASTY  2004  . LUMBAR LAMINECTOMY/DECOMPRESSION MICRODISCECTOMY N/A 03/23/2016   Procedure: Lumbar three-four Laminectomy/Foraminotomy, posterior laterial arthrodesis;  Surgeon: ELeeroy Cha MD;  [facility];  Service: Neurosurgery;  Laterality: N/A;  L3-4 Laminectomy/Foraminotomy  . THYROIDECTOMY  1962  . TONSILLECTOMY     Social History  Substance Use Topics  . Smoking status: Former Smoker    Quit date: 09/14/1947  . Smokeless tobacco: Never Used  . Alcohol use No   Family History  Problem Relation Age of Onset  . Heart disease Mother 747 . Stroke Father 815 . Heart disease Sister   . Cancer Brother     colon  . Diabetes Brother   . Leukemia Sister    Allergies  Allergen Reactions  . Sulfonamide Derivatives Nausea And Vomiting    REACTION: sick  . Alendronate Sodium Other (See Comments)    REACTION: GI   Current Outpatient Prescriptions on File Prior to Visit  Medication Sig Dispense Refill  . calcium carbonate (OS-CAL) 600 MG TABS tablet Take 1,200 mg  by mouth every evening.     . Cyanocobalamin 1000 MCG/ML KIT Inject 1 mL as directed every 8 (eight) weeks. 1 kit 0  . ferrous sulfate 325 (65 FE) MG tablet Take 325 mg by mouth daily. With lunch    . fluticasone (FLONASE) 50 MCG/ACT nasal spray Place 2 sprays into both nostrils daily. (Patient taking differently: Place 2 sprays into both nostrils as needed. ) 16 g 11  . furosemide (LASIX) 20 MG tablet Take 1 tablet (20 mg total) by mouth daily as needed. (Patient taking differently: Take 20 mg by mouth daily as needed for fluid. Daily weight check) 90 tablet 3  . Glucosamine HCl 500 MG TABS Take 500 mg by mouth daily.     .Marland Kitchenlevothyroxine (SYNTHROID, LEVOTHROID) 88 MCG tablet Take 1 tablet (88 mcg total) by mouth daily. 90 tablet 1  . lisinopril (PRINIVIL,ZESTRIL) 2.5 MG tablet TAKE ONE TABLET AT BEDTIME (8PM) (Patient taking differently: Take 2.5 mg by mouth at bedtime. BEDTIME (8PM)) 90 tablet 3  . Lutein 6 MG CAPS Take 1 capsule by mouth daily.     . metoprolol tartrate (LOPRESSOR) 25 MG tablet Take 1 tablet (25 mg total) by mouth 2 (two) times daily. (Patient taking differently: Take 25 mg by mouth daily. ) 180 tablet 3  . midodrine (PROAMATINE) 5 MG tablet TAKE ONE TABLET BY MOUTH TWICE DAILY WITH A MEAL 180 tablet 3  . mirtazapine (REMERON) 15 MG tablet Take 1 tablet (15 mg total) by mouth at bedtime. 90 tablet 3  . Multiple Vitamin (MULTIVITAMIN) tablet Take 1 tablet by mouth daily.     . pantoprazole (PROTONIX) 40 MG tablet Take 1 tablet (40 mg total) by mouth daily. 90 tablet 1  . potassium chloride (K-DUR,KLOR-CON) 10 MEQ tablet Take 1 tablet (10 mEq total)  by mouth daily. (Patient taking differently: Take 10 mEq by mouth every other day. ) 90 tablet 1  . senna-docusate (SENOKOT-S) 8.6-50 MG per tablet Take 1 tablet by mouth at bedtime.    . simvastatin (ZOCOR) 20 MG tablet TAKE 1/2 TABLET  (10MG  TOTAL) EVERY EVENING (Patient taking differently: Take 5 mg by mouth every evening. ) 45  tablet 1  . vitamin B-12 (CYANOCOBALAMIN) 1000 MCG tablet Take 1 tablet (1,000 mcg total) by mouth daily. 30 tablet 0  . warfarin (COUMADIN) 5 MG tablet Take as directed by anti-coagulation clinic. (Patient taking differently: Take 5 mg by mouth daily at 6 PM. Take 1 tablet 55m all days except on Monday and Friday take 7.530m 120 tablet 0   No current facility-administered medications on file prior to visit.     Review of Systems Review of Systems  Constitutional: Negative for fever, appetite change, and unexpected weight change. pos for fatigue and general weakness that is improving  Eyes: Negative for pain and visual disturbance.  Respiratory: Negative for cough and shortness of breath.   Cardiovascular: Negative for cp or palpitations   neg for pedal edema today Gastrointestinal: Negative for nausea, diarrhea and constipation.  Genitourinary: Negative for urgency and frequency.  Skin: Negative for pallor or rash   MSK pos for back soreness that is greatly improving  Neurological: Negative for weakness, light-headedness, numbness and headaches.  Hematological: Negative for adenopathy. Does not bruise/bleed easily.  Psychiatric/Behavioral: Negative for dysphoric mood. The patient is not nervous/anxious.         Objective:   Physical Exam  Constitutional: She appears well-developed and well-nourished. No distress.  Frail appearing elderly female in wheelchair (but able to walk) in good spirits Baseline hoarse voice   underweight  HENT:  Head: Normocephalic and atraumatic.  Mouth/Throat: Oropharynx is clear and moist.  Eyes: Conjunctivae and EOM are normal. Pupils are equal, round, and reactive to light.  Neck: Normal range of motion. Neck supple. No JVD present. Carotid bruit is not present. No thyromegaly present.  Cardiovascular: Normal rate, normal heart sounds and intact distal pulses.  Exam reveals no gallop.   No pedal edema  Varicosities present- largest on R ankle with  small erythematous area but no skin breakdown Compressible and nt  Pulmonary/Chest: Effort normal and breath sounds normal. No respiratory distress. She has no wheezes. She has no rales.  No crackles  Abdominal: Soft. Bowel sounds are normal. She exhibits no distension, no abdominal bruit and no mass. There is no tenderness.  Musculoskeletal: She exhibits no edema.  Wearing back brace  Lymphadenopathy:    She has no cervical adenopathy.  Neurological: She is alert. She has normal reflexes. She exhibits normal muscle tone. Coordination normal.  Skin: Skin is warm and dry. No rash noted. No pallor.  Areas of ecchymosis on arms are old and resolving  Few on legs also  No skin tears   Psychiatric: She has a normal mood and affect. Cognition and memory are normal.          Assessment & Plan:   Problem List Items Addressed This Visit      Cardiovascular and Mediastinum   Venous (peripheral) insufficiency (Chronic)    Enc pt to elevate feet whenever sitting or lying since she cannot wear supp hose (intolerant) Will watch area of erythema near R ankle (vein is compressible w/o skin breakdown)      Essential hypertension - Primary    Currently only taking her metoprolol  once daily due to bp in lower range (no dizziness) Lasix is prn/not often Lab today She will disc further with cardiology at f/u      Relevant Orders   Comprehensive metabolic panel   Acute on chronic combined systolic and diastolic heart failure (HCC)    Pt did get fluid overloaded in hospital-now resolved Has only needed lasix once since then  wts have been stable        Other   Warfarin anticoagulation    INR is 2.6 today- therapeutic  Will continue 5 mg warfarin daily with the same amt of green veg daily  Re check 1 mo      Lumbar stenosis with neurogenic claudication    Much clinical improvement since decompression surgery with Dr Joya Salm Now walking for PT  Wearing brace 2 more weeks F/u  planned      Acute blood loss anemia    S/p surgery (spine) and hematoma Pt is feeling better 2 U transfusion in hospital  Cbc today      Relevant Orders   CBC with Differential/Platelet   Ferritin    Other Visit Diagnoses   None.

## 2016-04-29 ENCOUNTER — Ambulatory Visit: Payer: PPO | Admitting: Cardiovascular Disease

## 2016-05-03 ENCOUNTER — Telehealth: Payer: Self-pay | Admitting: Cardiovascular Disease

## 2016-05-03 ENCOUNTER — Ambulatory Visit: Payer: PPO | Admitting: Cardiovascular Disease

## 2016-05-03 NOTE — Telephone Encounter (Signed)
Pt daughter cancelled pt appointment for today, due to the eclipse. She would like to discuss Metoprolol. Please call.

## 2016-05-03 NOTE — Telephone Encounter (Signed)
Spoke w/ [name]. She reports that pt's metoprolol was d/c'd by [facility] on 8/11 and that pt's BP has "stabilized in the 120-130s". She does not have readings to report, as she does not have her notebook w/ her. Asked her to call back if readings start to creep up while off of metoprolol. She apologized for cancelling appt, but she was afraid to get pt "out in the eclipse traffic" and wanted to make sure that pt does not look up during that time.

## 2016-05-04 NOTE — Telephone Encounter (Signed)
BP log for June, July & August placed on Dr. Donivan Scull desk for review.

## 2016-05-04 NOTE — Telephone Encounter (Signed)
Patient daughter dropped off bp log .  Placed in nurse in box.

## 2016-05-05 DIAGNOSIS — M810 Age-related osteoporosis without current pathological fracture: Secondary | ICD-10-CM | POA: Diagnosis not present

## 2016-05-05 DIAGNOSIS — Z7901 Long term (current) use of anticoagulants: Secondary | ICD-10-CM | POA: Diagnosis not present

## 2016-05-05 DIAGNOSIS — T45515D Adverse effect of anticoagulants, subsequent encounter: Secondary | ICD-10-CM | POA: Diagnosis not present

## 2016-05-05 DIAGNOSIS — Z87310 Personal history of (healed) osteoporosis fracture: Secondary | ICD-10-CM | POA: Diagnosis not present

## 2016-05-05 DIAGNOSIS — I2510 Atherosclerotic heart disease of native coronary artery without angina pectoris: Secondary | ICD-10-CM | POA: Diagnosis not present

## 2016-05-05 DIAGNOSIS — D6832 Hemorrhagic disorder due to extrinsic circulating anticoagulants: Secondary | ICD-10-CM | POA: Diagnosis not present

## 2016-05-05 DIAGNOSIS — Z981 Arthrodesis status: Secondary | ICD-10-CM | POA: Diagnosis not present

## 2016-05-05 DIAGNOSIS — D62 Acute posthemorrhagic anemia: Secondary | ICD-10-CM | POA: Diagnosis not present

## 2016-05-05 DIAGNOSIS — J449 Chronic obstructive pulmonary disease, unspecified: Secondary | ICD-10-CM | POA: Diagnosis not present

## 2016-05-05 DIAGNOSIS — M1991 Primary osteoarthritis, unspecified site: Secondary | ICD-10-CM | POA: Diagnosis not present

## 2016-05-05 DIAGNOSIS — Z4789 Encounter for other orthopedic aftercare: Secondary | ICD-10-CM | POA: Diagnosis not present

## 2016-05-05 DIAGNOSIS — I447 Left bundle-branch block, unspecified: Secondary | ICD-10-CM | POA: Diagnosis not present

## 2016-05-05 DIAGNOSIS — I110 Hypertensive heart disease with heart failure: Secondary | ICD-10-CM | POA: Diagnosis not present

## 2016-05-05 DIAGNOSIS — I482 Chronic atrial fibrillation: Secondary | ICD-10-CM | POA: Diagnosis not present

## 2016-05-05 DIAGNOSIS — I959 Hypotension, unspecified: Secondary | ICD-10-CM | POA: Diagnosis not present

## 2016-05-05 DIAGNOSIS — Z955 Presence of coronary angioplasty implant and graft: Secondary | ICD-10-CM | POA: Diagnosis not present

## 2016-05-05 DIAGNOSIS — I5023 Acute on chronic systolic (congestive) heart failure: Secondary | ICD-10-CM | POA: Diagnosis not present

## 2016-05-05 DIAGNOSIS — Z5181 Encounter for therapeutic drug level monitoring: Secondary | ICD-10-CM | POA: Diagnosis not present

## 2016-05-07 NOTE — Telephone Encounter (Signed)
Please call daughter she would like a response to some questions she has.  She wants to know if patient needs to be on 1 or 2 metoprolol .

## 2016-05-07 NOTE — Telephone Encounter (Signed)
Please review pt's BP log (in the basket on your desk) and advise what dose of metoprolol pt should be on.

## 2016-05-10 NOTE — Telephone Encounter (Signed)
Blood pressures reviewed On average, systolic pressure 123456 up to 130, rarely in the 90 systolic range Low pressure sometimes in the morning, sometimes in the evening, not consistent Would make sure she is taking midodrine in the morning, plenty of fluids Could change metoprolol down to 12.5 mg twice a day Otherwise looks pretty good

## 2016-05-11 MED ORDER — METOPROLOL TARTRATE 25 MG PO TABS: 12.5000 mg | ORAL_TABLET | Freq: Two times a day (BID) | ORAL | 3 refills | Status: AC

## 2016-05-11 NOTE — Addendum Note (Signed)
Addended by: Stana Bunting on: 05/11/2016 08:38 AM   Modules accepted: Orders

## 2016-05-11 NOTE — Telephone Encounter (Signed)
Spoke w/ [name].  Advised her of Dr. Donivan Scull recommendation.  She is agreeable and will call back w/ any questions or concerns.

## 2016-05-13 ENCOUNTER — Telehealth: Payer: Self-pay

## 2016-05-13 DIAGNOSIS — I482 Chronic atrial fibrillation: Secondary | ICD-10-CM | POA: Diagnosis not present

## 2016-05-13 DIAGNOSIS — I5023 Acute on chronic systolic (congestive) heart failure: Secondary | ICD-10-CM | POA: Diagnosis not present

## 2016-05-13 DIAGNOSIS — Z955 Presence of coronary angioplasty implant and graft: Secondary | ICD-10-CM | POA: Diagnosis not present

## 2016-05-13 DIAGNOSIS — M1991 Primary osteoarthritis, unspecified site: Secondary | ICD-10-CM | POA: Diagnosis not present

## 2016-05-13 DIAGNOSIS — I110 Hypertensive heart disease with heart failure: Secondary | ICD-10-CM | POA: Diagnosis not present

## 2016-05-13 DIAGNOSIS — Z4789 Encounter for other orthopedic aftercare: Secondary | ICD-10-CM | POA: Diagnosis not present

## 2016-05-13 DIAGNOSIS — Z7901 Long term (current) use of anticoagulants: Secondary | ICD-10-CM | POA: Diagnosis not present

## 2016-05-13 DIAGNOSIS — D62 Acute posthemorrhagic anemia: Secondary | ICD-10-CM | POA: Diagnosis not present

## 2016-05-13 DIAGNOSIS — Z87310 Personal history of (healed) osteoporosis fracture: Secondary | ICD-10-CM | POA: Diagnosis not present

## 2016-05-13 DIAGNOSIS — I2510 Atherosclerotic heart disease of native coronary artery without angina pectoris: Secondary | ICD-10-CM | POA: Diagnosis not present

## 2016-05-13 DIAGNOSIS — Z981 Arthrodesis status: Secondary | ICD-10-CM | POA: Diagnosis not present

## 2016-05-13 DIAGNOSIS — D6832 Hemorrhagic disorder due to extrinsic circulating anticoagulants: Secondary | ICD-10-CM | POA: Diagnosis not present

## 2016-05-13 DIAGNOSIS — Z5181 Encounter for therapeutic drug level monitoring: Secondary | ICD-10-CM | POA: Diagnosis not present

## 2016-05-13 DIAGNOSIS — M810 Age-related osteoporosis without current pathological fracture: Secondary | ICD-10-CM | POA: Diagnosis not present

## 2016-05-13 DIAGNOSIS — T45515D Adverse effect of anticoagulants, subsequent encounter: Secondary | ICD-10-CM | POA: Diagnosis not present

## 2016-05-13 DIAGNOSIS — I959 Hypotension, unspecified: Secondary | ICD-10-CM | POA: Diagnosis not present

## 2016-05-13 DIAGNOSIS — J449 Chronic obstructive pulmonary disease, unspecified: Secondary | ICD-10-CM | POA: Diagnosis not present

## 2016-05-13 DIAGNOSIS — I447 Left bundle-branch block, unspecified: Secondary | ICD-10-CM | POA: Diagnosis not present

## 2016-05-13 NOTE — Telephone Encounter (Signed)
Please ok that verbal order  

## 2016-05-13 NOTE — Telephone Encounter (Signed)
Verbal order given to Merry Proud, RN

## 2016-05-13 NOTE — Telephone Encounter (Signed)
Merry Proud nurse with [facility] left v/m requesting verbal order for home health PT to eval and treat for lower leg strengthening and gait stability. Merry Proud request cb.

## 2016-05-19 DIAGNOSIS — I110 Hypertensive heart disease with heart failure: Secondary | ICD-10-CM | POA: Diagnosis not present

## 2016-05-19 DIAGNOSIS — T45515D Adverse effect of anticoagulants, subsequent encounter: Secondary | ICD-10-CM | POA: Diagnosis not present

## 2016-05-19 DIAGNOSIS — Z4789 Encounter for other orthopedic aftercare: Secondary | ICD-10-CM | POA: Diagnosis not present

## 2016-05-19 DIAGNOSIS — I447 Left bundle-branch block, unspecified: Secondary | ICD-10-CM | POA: Diagnosis not present

## 2016-05-19 DIAGNOSIS — D62 Acute posthemorrhagic anemia: Secondary | ICD-10-CM | POA: Diagnosis not present

## 2016-05-19 DIAGNOSIS — J449 Chronic obstructive pulmonary disease, unspecified: Secondary | ICD-10-CM | POA: Diagnosis not present

## 2016-05-19 DIAGNOSIS — I5023 Acute on chronic systolic (congestive) heart failure: Secondary | ICD-10-CM | POA: Diagnosis not present

## 2016-05-19 DIAGNOSIS — M1991 Primary osteoarthritis, unspecified site: Secondary | ICD-10-CM | POA: Diagnosis not present

## 2016-05-19 DIAGNOSIS — M810 Age-related osteoporosis without current pathological fracture: Secondary | ICD-10-CM | POA: Diagnosis not present

## 2016-05-19 DIAGNOSIS — I2510 Atherosclerotic heart disease of native coronary artery without angina pectoris: Secondary | ICD-10-CM | POA: Diagnosis not present

## 2016-05-19 DIAGNOSIS — I482 Chronic atrial fibrillation: Secondary | ICD-10-CM | POA: Diagnosis not present

## 2016-05-19 DIAGNOSIS — I959 Hypotension, unspecified: Secondary | ICD-10-CM | POA: Diagnosis not present

## 2016-05-19 DIAGNOSIS — D6832 Hemorrhagic disorder due to extrinsic circulating anticoagulants: Secondary | ICD-10-CM | POA: Diagnosis not present

## 2016-05-20 DIAGNOSIS — I110 Hypertensive heart disease with heart failure: Secondary | ICD-10-CM | POA: Diagnosis not present

## 2016-05-20 DIAGNOSIS — I447 Left bundle-branch block, unspecified: Secondary | ICD-10-CM | POA: Diagnosis not present

## 2016-05-20 DIAGNOSIS — J449 Chronic obstructive pulmonary disease, unspecified: Secondary | ICD-10-CM | POA: Diagnosis not present

## 2016-05-20 DIAGNOSIS — I959 Hypotension, unspecified: Secondary | ICD-10-CM | POA: Diagnosis not present

## 2016-05-20 DIAGNOSIS — Z4789 Encounter for other orthopedic aftercare: Secondary | ICD-10-CM | POA: Diagnosis not present

## 2016-05-20 DIAGNOSIS — T45515D Adverse effect of anticoagulants, subsequent encounter: Secondary | ICD-10-CM | POA: Diagnosis not present

## 2016-05-20 DIAGNOSIS — I5023 Acute on chronic systolic (congestive) heart failure: Secondary | ICD-10-CM | POA: Diagnosis not present

## 2016-05-20 DIAGNOSIS — I2510 Atherosclerotic heart disease of native coronary artery without angina pectoris: Secondary | ICD-10-CM | POA: Diagnosis not present

## 2016-05-20 DIAGNOSIS — D6832 Hemorrhagic disorder due to extrinsic circulating anticoagulants: Secondary | ICD-10-CM | POA: Diagnosis not present

## 2016-05-20 DIAGNOSIS — I482 Chronic atrial fibrillation: Secondary | ICD-10-CM | POA: Diagnosis not present

## 2016-05-20 DIAGNOSIS — M810 Age-related osteoporosis without current pathological fracture: Secondary | ICD-10-CM | POA: Diagnosis not present

## 2016-05-20 DIAGNOSIS — D62 Acute posthemorrhagic anemia: Secondary | ICD-10-CM | POA: Diagnosis not present

## 2016-05-20 DIAGNOSIS — M1991 Primary osteoarthritis, unspecified site: Secondary | ICD-10-CM | POA: Diagnosis not present

## 2016-05-24 DIAGNOSIS — Z4789 Encounter for other orthopedic aftercare: Secondary | ICD-10-CM | POA: Diagnosis not present

## 2016-05-24 DIAGNOSIS — D6832 Hemorrhagic disorder due to extrinsic circulating anticoagulants: Secondary | ICD-10-CM | POA: Diagnosis not present

## 2016-05-24 DIAGNOSIS — I482 Chronic atrial fibrillation: Secondary | ICD-10-CM | POA: Diagnosis not present

## 2016-05-24 DIAGNOSIS — I447 Left bundle-branch block, unspecified: Secondary | ICD-10-CM | POA: Diagnosis not present

## 2016-05-24 DIAGNOSIS — I5023 Acute on chronic systolic (congestive) heart failure: Secondary | ICD-10-CM | POA: Diagnosis not present

## 2016-05-24 DIAGNOSIS — M810 Age-related osteoporosis without current pathological fracture: Secondary | ICD-10-CM | POA: Diagnosis not present

## 2016-05-24 DIAGNOSIS — I110 Hypertensive heart disease with heart failure: Secondary | ICD-10-CM | POA: Diagnosis not present

## 2016-05-24 DIAGNOSIS — D62 Acute posthemorrhagic anemia: Secondary | ICD-10-CM | POA: Diagnosis not present

## 2016-05-24 DIAGNOSIS — I959 Hypotension, unspecified: Secondary | ICD-10-CM | POA: Diagnosis not present

## 2016-05-24 DIAGNOSIS — T45515D Adverse effect of anticoagulants, subsequent encounter: Secondary | ICD-10-CM | POA: Diagnosis not present

## 2016-05-24 DIAGNOSIS — M1991 Primary osteoarthritis, unspecified site: Secondary | ICD-10-CM | POA: Diagnosis not present

## 2016-05-24 DIAGNOSIS — J449 Chronic obstructive pulmonary disease, unspecified: Secondary | ICD-10-CM | POA: Diagnosis not present

## 2016-05-24 DIAGNOSIS — I2510 Atherosclerotic heart disease of native coronary artery without angina pectoris: Secondary | ICD-10-CM | POA: Diagnosis not present

## 2016-05-27 DIAGNOSIS — D62 Acute posthemorrhagic anemia: Secondary | ICD-10-CM | POA: Diagnosis not present

## 2016-05-27 DIAGNOSIS — I959 Hypotension, unspecified: Secondary | ICD-10-CM | POA: Diagnosis not present

## 2016-05-27 DIAGNOSIS — I110 Hypertensive heart disease with heart failure: Secondary | ICD-10-CM | POA: Diagnosis not present

## 2016-05-27 DIAGNOSIS — I482 Chronic atrial fibrillation: Secondary | ICD-10-CM | POA: Diagnosis not present

## 2016-05-27 DIAGNOSIS — D6832 Hemorrhagic disorder due to extrinsic circulating anticoagulants: Secondary | ICD-10-CM | POA: Diagnosis not present

## 2016-05-27 DIAGNOSIS — M810 Age-related osteoporosis without current pathological fracture: Secondary | ICD-10-CM | POA: Diagnosis not present

## 2016-05-27 DIAGNOSIS — I447 Left bundle-branch block, unspecified: Secondary | ICD-10-CM | POA: Diagnosis not present

## 2016-05-27 DIAGNOSIS — M1991 Primary osteoarthritis, unspecified site: Secondary | ICD-10-CM | POA: Diagnosis not present

## 2016-05-27 DIAGNOSIS — I2510 Atherosclerotic heart disease of native coronary artery without angina pectoris: Secondary | ICD-10-CM | POA: Diagnosis not present

## 2016-05-27 DIAGNOSIS — I5023 Acute on chronic systolic (congestive) heart failure: Secondary | ICD-10-CM | POA: Diagnosis not present

## 2016-05-27 DIAGNOSIS — T45515D Adverse effect of anticoagulants, subsequent encounter: Secondary | ICD-10-CM | POA: Diagnosis not present

## 2016-05-27 DIAGNOSIS — Z4789 Encounter for other orthopedic aftercare: Secondary | ICD-10-CM | POA: Diagnosis not present

## 2016-05-27 DIAGNOSIS — J449 Chronic obstructive pulmonary disease, unspecified: Secondary | ICD-10-CM | POA: Diagnosis not present

## 2016-05-27 NOTE — Progress Notes (Signed)
Cardiology Office Note Date:  05/28/2016  Patient ID:  [name], [name] 1922/07/14, MRN [id number] PCP:  Loura Pardon, MD  Cardiologist:  Dr. Rockey Situ, MD    Chief Complaint: Follow up  History of Present Illness: [name] is a 82 y.o. female with history of CAD s/p remote PCI x 2 to the LAD as below in 2004, chronic Afib on Coumadin, chronic combined CHF (EF 30-35% by echo in 01/2016), PAH, orthostatic hypotension on midodrine, mitral regurgitation, tricuspid regurgitation, prolonged QTc, LBBB, carotid artery disease, HTN, thrombocytopenia, and chronic back pain s/p recent lumbar decompression complicated by post-op hematoma with acute blood loss anemia with hgb of 7.2 s/p blood transfusion in 03/2016 who presents for routine follow up of the above.   Prior cardiac cath from 08/2003 at [facility] showed mid LAS with 75% stenosis s/p PCI/DES and distal LAD with 75% stenosis s/p PCI/DES.   She was most recently seen by Dr. Rockey Situ on 01/27/16 and was doing well at that time. She was cleared for the above surgery by Dr. Rockey Situ, MD. She was admitted to [facility] in 03/2015 with N/V/volume overload requiring diuresis. Echo that admission showed a newly found cardiomyopathy with an EF of 30-35% (previously 50-55% in 2014). Given her frailty it was felt conservative maangement was best. She continued to have fatigue, thus underwent Lexiscan Myoview on 07/02/2015 that showed no ischemia, anteroseptal perfusion defect consistent with bundle branch block. Most recent echo from 01/2016 showed an EF of HK of anteroseptal myocardium, mildly dilated aoritc root at 3.4 cm, mildly dilated ascending aorta at 3.5 cm, mild MR, midl to moderate TR, PASP 33 mm Hg. Admitted to [facility] recently for the above lumbar decompression surgery for her chronic back pain. Hospitalization was complicated by post-op hematoma, acute blood loss anemia requiring transfusion, UTI, and atelectasis. BNP 600 (improved from prior). Lasix was  recommended with blood transfusion. Her Coumadin had been held prior to her suregy with a Lovenox bridge. Upon correction of her anemia her Coumadin was restarted given her CHADS2VASc of 7. BP log has reportedly shown readings consistently in the 010-071 systolic range.    She comes in doing well today. Weight stable in the 120 to 122 pound range. Has not needed to take any Lasix for weight gain or SOB. Watches her fluid consumption. Heart rate has been in the 70's to 80' s bpm at home. Tolerating Coumadin without issues. BP has ranged from the 219'X to 588'T systolic. Working with HHPT and making good progress. Appetite is good.    Past Medical History:  Diagnosis Date  . Allergy   . Anemia   . Arthritis    osteo  . CAD (coronary artery disease)    a. 2 DES, [facility], 2004; b. nuc 2013 septal dyssenergy 2/2 LBBB, nl LV fxn;  c. 06/2015 MV: EF 41%, septal HK (LBBB), no ischemia->Low risk.  . Chronic atrial fibrillation (HCC)    a. on Coumadin (CHA2DS2VASc = 6); b. Holter 06/2010, no brady, mild tachy.  . Compression fracture of lumbar vertebra ([place]) 05-2008  . COPD (chronic obstructive pulmonary disease) ([place])   . Diverticulosis of colon   . Dyslipidemia   . Essential hypertension   . Family history of adverse reaction to anesthesia    daughter PONV  . GERD (gastroesophageal reflux disease)   . Hard of hearing   . Hematemesis    a. 03/2015.  [name] Hx of colonoscopy   . Hyperlipidemia   . Hypertension   .  Hypothyroid   . Ischemic cardiomyopathy    a. 03/2015 Echo: EF 30-35%, ant, antsept HK, mod MR, mildly dil LA/RV, mod dil RA, mod-sev TR, PASP 21mHg.  .Marland KitchenLBBB (left bundle branch block)   . Lung abnormality    Dr CGwenette Greet122/2011 no further w/u needed  . Mitral regurgitation   . Myocardial infarction ([place] 2016   "mild heart attack"  . Nausea    Some nausea with medications, May, 2013,  . Nocturia   . OA (osteoarthritis)   . Orthostatic hypotension    June, 2014  . Osteoporosis   .  Pleural effusion associated with pulmonary infection 03-2007  . Prolonged QT interval    when on sotolol in past  . Pulmonary hypertension ([place]    a. 03/2015 PASP 564mg by echo.  . Shingles 2014  . Thrombocytopenia ([facility]  . TR (tricuspid regurgitation)    moderate, echo 2008  . Urinary frequency   . Vitamin B deficiency   . Warfarin anticoagulation   . Wears dentures   . Wears glasses   . Weight loss    August, 2012    Past Surgical History:  Procedure Laterality Date  . COLONOSCOPY    . CORONARY ANGIOPLASTY  2004  . LUMBAR LAMINECTOMY/DECOMPRESSION MICRODISCECTOMY N/A 03/23/2016   Procedure: Lumbar three-four Laminectomy/Foraminotomy, posterior laterial arthrodesis;  Surgeon: ErLeeroy ChaMD;  [facility];  Service: Neurosurgery;  Laterality: N/A;  L3-4 Laminectomy/Foraminotomy  . THYROIDECTOMY  1962  . TONSILLECTOMY      Current Outpatient Prescriptions  Medication Sig Dispense Refill  . calcium carbonate (OS-CAL) 600 MG TABS tablet Take 1,200 mg by mouth every evening.     . Cyanocobalamin 1000 MCG/ML KIT Inject 1 mL as directed every 8 (eight) weeks. 1 kit 0  . fluticasone (FLONASE) 50 MCG/ACT nasal spray Place 2 sprays into both nostrils daily. (Patient taking differently: Place 2 sprays into both nostrils as needed. ) 16 g 11  . furosemide (LASIX) 20 MG tablet Take 1 tablet (20 mg total) by mouth daily as needed. (Patient taking differently: Take 20 mg by mouth daily as needed for fluid. Daily weight check) 90 tablet 3  . Glucosamine HCl 500 MG TABS Take 500 mg by mouth daily.     . Marland Kitchenevothyroxine (SYNTHROID, LEVOTHROID) 88 MCG tablet Take 1 tablet (88 mcg total) by mouth daily. 90 tablet 1  . lisinopril (PRINIVIL,ZESTRIL) 2.5 MG tablet TAKE ONE TABLET AT BEDTIME (8PM) (Patient taking differently: Take 2.5 mg by mouth at bedtime. BEDTIME (8PM)) 90 tablet 3  . Lutein 6 MG CAPS Take 1 capsule by mouth daily.     . metoprolol tartrate (LOPRESSOR) 25 MG tablet  Take 0.5 tablets (12.5 mg total) by mouth 2 (two) times daily. 180 tablet 3  . midodrine (PROAMATINE) 5 MG tablet TAKE ONE TABLET BY MOUTH TWICE DAILY WITH A MEAL 180 tablet 3  . mirtazapine (REMERON) 15 MG tablet Take 1 tablet (15 mg total) by mouth at bedtime. 90 tablet 3  . Multiple Vitamin (MULTIVITAMIN) tablet Take 1 tablet by mouth daily.     . pantoprazole (PROTONIX) 40 MG tablet Take 1 tablet (40 mg total) by mouth daily. 90 tablet 1  . senna-docusate (SENOKOT-S) 8.6-50 MG per tablet Take 1 tablet by mouth at bedtime.    . simvastatin (ZOCOR) 20 MG tablet TAKE 1/2 TABLET  (10MG  TOTAL) EVERY EVENING (Patient taking differently: Take 5 mg by mouth every evening. ) 45 tablet 1  .  vitamin B-12 (CYANOCOBALAMIN) 1000 MCG tablet Take 1 tablet (1,000 mcg total) by mouth daily. 30 tablet 0  . warfarin (COUMADIN) 5 MG tablet Take as directed by anti-coagulation clinic. (Patient taking differently: Take 5 mg by mouth daily at 6 PM. Take 1 tablet 65m all days except on Monday and Friday take 7.515m 120 tablet 0   No current facility-administered medications for this visit.     Allergies:   Sulfonamide derivatives and Alendronate sodium   Social History:  The patient  reports that she quit smoking about 68 years ago. She has never used smokeless tobacco. She reports that she does not drink alcohol or use drugs.   Family History:  The patient's family history includes Cancer in her brother; Diabetes in her brother; Heart disease in her sister; Heart disease (age of onset: 7629in her mother; Leukemia in her sister; Stroke (age of onset: 8268in her father.  ROS:   Review of Systems  Constitutional: Positive for malaise/fatigue. Negative for chills, diaphoresis, fever and weight loss.  HENT: Negative for congestion.   Eyes: Negative for discharge and redness.  Respiratory: Negative for cough, sputum production, shortness of breath and wheezing.   Cardiovascular: Negative for chest pain,  palpitations, orthopnea, claudication, leg swelling and PND.  Gastrointestinal: Negative for abdominal pain, heartburn, nausea and vomiting.  Genitourinary: Negative for hematuria.  Musculoskeletal: Positive for back pain. Negative for falls and myalgias.  Skin: Negative for rash.  Neurological: Negative for dizziness, tingling, tremors, sensory change, speech change, focal weakness, loss of consciousness and weakness.  Endo/Heme/Allergies: Does not bruise/bleed easily.  Psychiatric/Behavioral: Negative for substance abuse. The patient is not nervous/anxious.   All other systems reviewed and are negative.    PHYSICAL EXAM:  VS:  BP 110/70 (BP Location: Right Arm, Patient Position: Sitting, Cuff Size: Normal)   Pulse 76   Ht 5' 7"  (1.702 m)   Wt 122 lb 2 oz (55.4 kg)   BMI 19.13 kg/m  BMI: Body mass index is 19.13 kg/m.  Physical Exam  Constitutional: She is oriented to person, place, and time. She appears well-developed and well-nourished.  HENT:  Head: Normocephalic and atraumatic.  Eyes: Right eye exhibits no discharge. Left eye exhibits no discharge.  Neck: Normal range of motion. No JVD present.  Cardiovascular: Normal rate, S1 normal, S2 normal and normal heart sounds.  An irregularly irregular rhythm present. Exam reveals no distant heart sounds, no friction rub, no midsystolic click and no opening snap.   No murmur heard. Pulses:      Dorsalis pedis pulses are 2+ on the right side, and 2+ on the left side.  Pulmonary/Chest: Effort normal and breath sounds normal. No respiratory distress. She has no decreased breath sounds. She has no wheezes. She has no rales. She exhibits no tenderness.  Abdominal: Soft. She exhibits no distension. There is no tenderness.  Musculoskeletal: She exhibits no edema.  Neurological: She is alert and oriented to person, place, and time.  Skin: Skin is warm and dry. No cyanosis. Nails show no clubbing.  Psychiatric: She has a normal mood and  affect. Her speech is normal and behavior is normal. Judgment and thought content normal.     EKG:  Was ordered and interpreted by me today. Shows Afib, 76 bpm, LBBB (known)  Recent Labs: 03/31/2016: B Natriuretic Peptide 616.2 04/28/2016: ALT 8; BUN 22; Creatinine, Ser 1.20; Hemoglobin 11.9; Platelets 146.0; Potassium 5.5; Sodium 139  No results found for requested labs within last 8760 hours.  CrCl cannot be calculated (Patient's most recent lab result is older than the maximum 21 days allowed.).   Wt Readings from Last 3 Encounters:  05/28/16 122 lb 2 oz (55.4 kg)  04/28/16 117 lb 2 oz (53.1 kg)  04/02/16 137 lb 6.4 oz (62.3 kg)     Other studies reviewed: Additional studies/records reviewed today include: summarized above  ASSESSMENT AND PLAN:  1. CAD s/p remote PCI as above: No symptoms concerning for angina. On Coumadin in place of ASA. Continue current regimen of Lopressor and simvastatin. No plans for ischemic evaluation at this time.   2. Chronic combined CHF/PAH: She does not appear to be volume overloaded at this time. Most recent echo from 02/02/2016 showed stable reduced EF and improved right-sided pressures. Lasix was stopped in mid August by PCP, was only taking prn at that time. KCl was also stopped given hyperkalemia at that time. CHF education provided. Should she note a 3 pounds weight gain overnight or a 5 pounds weight gain in 1 week she will take one Lasix 20 mg with KCl repletion. Otherwise, she can monitor and control her weight with fluid restriction as needed for low weight gains.   3. Chronic Afib: Currently rate controlled and has been rate controlled at home as well. Continue Lopressor. On Coumadin with INR followed by PCP. CHADS2VASc at least 7 (CHF, HTN, age x 2, DM, vascular disease, female).  4. Orthostatic hypotension: Stable. Continue current medications.   5. Valvular heart disease: Stable/improved by echo from 02/02/2016. Monitor periodically.    6. HTN: Well controlled today. Continue current medications.   7. Anemia: Patient requested iron study.   Disposition: F/u with Dr. Rockey Situ in 3 months.   Current medicines are reviewed at length with the patient today.  The patient did not have any concerns regarding medicines.  Melvern Banker PA-C 05/28/2016 10:31 AM     [facility] [address] [place] [place], [place] [address] [phone]

## 2016-05-28 ENCOUNTER — Encounter: Payer: Self-pay | Admitting: Physician Assistant

## 2016-05-28 ENCOUNTER — Ambulatory Visit (INDEPENDENT_AMBULATORY_CARE_PROVIDER_SITE_OTHER): Payer: PPO | Admitting: Physician Assistant

## 2016-05-28 ENCOUNTER — Other Ambulatory Visit: Payer: Self-pay | Admitting: Family Medicine

## 2016-05-28 VITALS — BP 110/70 | HR 76 | Ht 67.0 in | Wt 122.1 lb

## 2016-05-28 DIAGNOSIS — D649 Anemia, unspecified: Secondary | ICD-10-CM | POA: Diagnosis not present

## 2016-05-28 DIAGNOSIS — I481 Persistent atrial fibrillation: Secondary | ICD-10-CM

## 2016-05-28 DIAGNOSIS — I5043 Acute on chronic combined systolic (congestive) and diastolic (congestive) heart failure: Secondary | ICD-10-CM | POA: Diagnosis not present

## 2016-05-28 DIAGNOSIS — I5042 Chronic combined systolic (congestive) and diastolic (congestive) heart failure: Secondary | ICD-10-CM

## 2016-05-28 DIAGNOSIS — I2510 Atherosclerotic heart disease of native coronary artery without angina pectoris: Secondary | ICD-10-CM | POA: Diagnosis not present

## 2016-05-28 DIAGNOSIS — I4819 Other persistent atrial fibrillation: Secondary | ICD-10-CM

## 2016-05-28 DIAGNOSIS — E875 Hyperkalemia: Secondary | ICD-10-CM | POA: Diagnosis not present

## 2016-05-28 NOTE — Patient Instructions (Signed)
Medication Instructions: - Your physician recommends that you continue on your current medications as directed. Please refer to the Current Medication list given to you today.  Labwork: - Your physician recommends that you have lab work today: [employer]  Procedures/Testing: - none ordered  Follow-Up: - Your physician recommends that you schedule a follow-up appointment in: 3 months with Dr. Rockey Situ  Any Additional Special Instructions Will Be Listed Below (If Applicable). - If your weight goes up 3 lbs in 3 days, then restrict your fluid intake the next day, you may resume normal fluid intake when your weight goes back down.    If you need a refill on your cardiac medications before your next appointment, please call your pharmacy.

## 2016-05-29 LAB — SPECIMEN STATUS

## 2016-05-31 ENCOUNTER — Telehealth: Payer: Self-pay | Admitting: Cardiovascular Disease

## 2016-05-31 ENCOUNTER — Other Ambulatory Visit (INDEPENDENT_AMBULATORY_CARE_PROVIDER_SITE_OTHER): Payer: PPO

## 2016-05-31 ENCOUNTER — Telehealth: Payer: Self-pay | Admitting: Physician Assistant

## 2016-05-31 ENCOUNTER — Other Ambulatory Visit: Payer: Self-pay

## 2016-05-31 DIAGNOSIS — I482 Chronic atrial fibrillation, unspecified: Secondary | ICD-10-CM

## 2016-05-31 DIAGNOSIS — I2510 Atherosclerotic heart disease of native coronary artery without angina pectoris: Secondary | ICD-10-CM | POA: Diagnosis not present

## 2016-05-31 DIAGNOSIS — Z23 Encounter for immunization: Secondary | ICD-10-CM

## 2016-05-31 DIAGNOSIS — M1991 Primary osteoarthritis, unspecified site: Secondary | ICD-10-CM | POA: Diagnosis not present

## 2016-05-31 DIAGNOSIS — Z7901 Long term (current) use of anticoagulants: Secondary | ICD-10-CM | POA: Diagnosis not present

## 2016-05-31 DIAGNOSIS — I447 Left bundle-branch block, unspecified: Secondary | ICD-10-CM | POA: Diagnosis not present

## 2016-05-31 DIAGNOSIS — I110 Hypertensive heart disease with heart failure: Secondary | ICD-10-CM | POA: Diagnosis not present

## 2016-05-31 DIAGNOSIS — I959 Hypotension, unspecified: Secondary | ICD-10-CM | POA: Diagnosis not present

## 2016-05-31 DIAGNOSIS — E875 Hyperkalemia: Secondary | ICD-10-CM

## 2016-05-31 DIAGNOSIS — T45515D Adverse effect of anticoagulants, subsequent encounter: Secondary | ICD-10-CM | POA: Diagnosis not present

## 2016-05-31 DIAGNOSIS — M810 Age-related osteoporosis without current pathological fracture: Secondary | ICD-10-CM | POA: Diagnosis not present

## 2016-05-31 DIAGNOSIS — J449 Chronic obstructive pulmonary disease, unspecified: Secondary | ICD-10-CM | POA: Diagnosis not present

## 2016-05-31 DIAGNOSIS — D6832 Hemorrhagic disorder due to extrinsic circulating anticoagulants: Secondary | ICD-10-CM | POA: Diagnosis not present

## 2016-05-31 DIAGNOSIS — Z4789 Encounter for other orthopedic aftercare: Secondary | ICD-10-CM | POA: Diagnosis not present

## 2016-05-31 DIAGNOSIS — I5023 Acute on chronic systolic (congestive) heart failure: Secondary | ICD-10-CM | POA: Diagnosis not present

## 2016-05-31 DIAGNOSIS — D62 Acute posthemorrhagic anemia: Secondary | ICD-10-CM | POA: Diagnosis not present

## 2016-05-31 LAB — POCT INR: INR: 1.3

## 2016-05-31 LAB — BASIC METABOLIC PANEL
BUN / CREAT RATIO: 17 (ref 12–28)
BUN: 19 mg/dL (ref 10–36)
CHLORIDE: 104 mmol/L (ref 96–106)
CO2: 23 mmol/L (ref 18–29)
CREATININE: 1.15 mg/dL — AB (ref 0.57–1.00)
Calcium: 9.6 mg/dL (ref 8.7–10.3)
GFR calc non Af Amer: 41 mL/min/{1.73_m2} — ABNORMAL LOW (ref >59)
GFR, EST AFRICAN AMERICAN: 48 mL/min/{1.73_m2} — AB (ref >59)
Glucose: 118 mg/dL — ABNORMAL HIGH (ref 65–99)
POTASSIUM: 5.5 mmol/L — AB (ref 3.5–5.2)
SODIUM: 143 mmol/L (ref 134–144)

## 2016-05-31 LAB — IRON: Iron: 55 ug/dL (ref 27–139)

## 2016-05-31 NOTE — Telephone Encounter (Signed)
[facility] calling asking if the lavender top was extra. Please call and advise.

## 2016-05-31 NOTE — Telephone Encounter (Signed)
Don't want resting heart rate in the mid 90's as she is likely to become tachycardic with ambulation at rate. Given her cardiomyopathy she would do better with a lower heart rate. If BP room can increase Lopressor to 25 mg bid from 12/5 mg bid. Would take Lopressor 25 mg bid if BP is [id number] systolic to lower heart rate to a resting rate in the 70's bpm.

## 2016-05-31 NOTE — Telephone Encounter (Signed)
Spoke w/ [name].  Advised her of Ryan's recommendation.  After considerable explanation & discussion, she verbalizes understanding.

## 2016-05-31 NOTE — Telephone Encounter (Signed)
Pt daughter calling stating pt HR has been above 46  Was told to call us if it got higher than 80   05/31/16 8:30 am 148/75 HR 83 8:25AM Laying down 146/74 HR 85  8:30 AM Sitting down  148/75 HR 83 9:15 am133/79 HR 112

## 2016-05-31 NOTE — Telephone Encounter (Signed)
Patients daughter called to clarify instructions that she was just given. Patient to take Lopressor 25 mg twice a day if systolic blood pressure is greater than 110. She verbalized understanding and had no further questions at this time.

## 2016-05-31 NOTE — Telephone Encounter (Signed)
Per Christell Faith, PA-C, may discard lavender top. All labs that were needed collected. Left message Audie Pinto @ [facility].

## 2016-06-01 ENCOUNTER — Telehealth: Payer: Self-pay | Admitting: Cardiovascular Disease

## 2016-06-01 NOTE — Telephone Encounter (Signed)
Ruth home health sitter calling  06/01/16 8:15 am 161/89 HR 89 8:30 am 150/87 HR 93 8:40 am just 25 mg of Metoprolol 133/59 HR 94 9:22 am 132/66 HR 91  Was told by daughter to call us or 911 or hospital   Just needs advise on this Please advise.    Was told BP top number should not be more than 110

## 2016-06-01 NOTE — Telephone Encounter (Addendum)
Spoke w/ Rod Holler.  She reports that she gave pt metoprolol 25 mg this am. She has been checking pt's BP every few mins, as pt's daughter told her that if her SBP > 110, she would need to go to the ED. Advised her that these were NOT the instructions given to pt's daughter yesterday. Advised her of instructions regarding metoprolol and she is agreeable.  Asked her to call back w/ any further questions or concerns.

## 2016-06-01 NOTE — Telephone Encounter (Signed)
Error

## 2016-06-03 ENCOUNTER — Other Ambulatory Visit: Payer: Self-pay | Admitting: Family Medicine

## 2016-06-03 ENCOUNTER — Other Ambulatory Visit
Admission: RE | Admit: 2016-06-03 | Discharge: 2016-06-03 | Disposition: A | Discharge status: Home / Self Care | Payer: PPO | Source: Ambulatory Visit | Attending: Physician Assistant | Admitting: Physician Assistant

## 2016-06-03 DIAGNOSIS — I5023 Acute on chronic systolic (congestive) heart failure: Secondary | ICD-10-CM | POA: Diagnosis not present

## 2016-06-03 DIAGNOSIS — I110 Hypertensive heart disease with heart failure: Secondary | ICD-10-CM | POA: Diagnosis not present

## 2016-06-03 DIAGNOSIS — T45515D Adverse effect of anticoagulants, subsequent encounter: Secondary | ICD-10-CM | POA: Diagnosis not present

## 2016-06-03 DIAGNOSIS — E875 Hyperkalemia: Secondary | ICD-10-CM | POA: Diagnosis not present

## 2016-06-03 DIAGNOSIS — J449 Chronic obstructive pulmonary disease, unspecified: Secondary | ICD-10-CM | POA: Diagnosis not present

## 2016-06-03 DIAGNOSIS — I482 Chronic atrial fibrillation: Secondary | ICD-10-CM | POA: Diagnosis not present

## 2016-06-03 DIAGNOSIS — D6832 Hemorrhagic disorder due to extrinsic circulating anticoagulants: Secondary | ICD-10-CM | POA: Diagnosis not present

## 2016-06-03 DIAGNOSIS — I2510 Atherosclerotic heart disease of native coronary artery without angina pectoris: Secondary | ICD-10-CM | POA: Diagnosis not present

## 2016-06-03 DIAGNOSIS — D62 Acute posthemorrhagic anemia: Secondary | ICD-10-CM | POA: Diagnosis not present

## 2016-06-03 DIAGNOSIS — Z4789 Encounter for other orthopedic aftercare: Secondary | ICD-10-CM | POA: Diagnosis not present

## 2016-06-03 DIAGNOSIS — I447 Left bundle-branch block, unspecified: Secondary | ICD-10-CM | POA: Diagnosis not present

## 2016-06-03 DIAGNOSIS — I959 Hypotension, unspecified: Secondary | ICD-10-CM | POA: Diagnosis not present

## 2016-06-03 DIAGNOSIS — M1991 Primary osteoarthritis, unspecified site: Secondary | ICD-10-CM | POA: Diagnosis not present

## 2016-06-03 DIAGNOSIS — M810 Age-related osteoporosis without current pathological fracture: Secondary | ICD-10-CM | POA: Diagnosis not present

## 2016-06-03 LAB — BASIC METABOLIC PANEL
ANION GAP: 7 (ref 5–15)
BUN: 22 mg/dL — ABNORMAL HIGH (ref 6–20)
CALCIUM: 9.1 mg/dL (ref 8.9–10.3)
CO2: 24 mmol/L (ref 22–32)
Chloride: 109 mmol/L (ref 101–111)
Creatinine, Ser: 1.23 mg/dL — ABNORMAL HIGH (ref 0.44–1.00)
GFR, EST AFRICAN AMERICAN: 43 mL/min — AB (ref >60)
GFR, EST NON AFRICAN AMERICAN: 37 mL/min — AB (ref >60)
Glucose, Bld: 108 mg/dL — ABNORMAL HIGH (ref 65–99)
POTASSIUM: 4.5 mmol/L (ref 3.5–5.1)
Sodium: 140 mmol/L (ref 135–145)

## 2016-06-04 ENCOUNTER — Telehealth: Payer: Self-pay | Admitting: *Deleted

## 2016-06-04 NOTE — Telephone Encounter (Signed)
Spoke with patients daughter regarding recommendations with medications based on current lab work. She did report that her mother has not had to use lasix on a regular basis. Instructed her to continue holding lisinopril and that her potassium levels were better. Also instructed her to have her mother increase fluid intake slightly. She was somewhat confused and wanted some clarification. Dr. Glori Bickers discontinued her potassium and if she increases her fluid intake and requires lasix does she still need to administer potassium with that. Let her know that I would forward to Christell Faith PA for his recommendations on this. She verbalized understanding on holding lisinopril and taking lasix only as needed for weight gain. Also instructed her to continue monitoring her weights and call if there are any problems. She verbalized understanding of our conversation and had no further questions. Let her know that I would forward to [place] and be in touch.

## 2016-06-07 ENCOUNTER — Other Ambulatory Visit (INDEPENDENT_AMBULATORY_CARE_PROVIDER_SITE_OTHER): Payer: PPO

## 2016-06-07 DIAGNOSIS — I5023 Acute on chronic systolic (congestive) heart failure: Secondary | ICD-10-CM | POA: Diagnosis not present

## 2016-06-07 DIAGNOSIS — I2510 Atherosclerotic heart disease of native coronary artery without angina pectoris: Secondary | ICD-10-CM | POA: Diagnosis not present

## 2016-06-07 DIAGNOSIS — I482 Chronic atrial fibrillation, unspecified: Secondary | ICD-10-CM

## 2016-06-07 DIAGNOSIS — M1991 Primary osteoarthritis, unspecified site: Secondary | ICD-10-CM | POA: Diagnosis not present

## 2016-06-07 DIAGNOSIS — D62 Acute posthemorrhagic anemia: Secondary | ICD-10-CM | POA: Diagnosis not present

## 2016-06-07 DIAGNOSIS — J449 Chronic obstructive pulmonary disease, unspecified: Secondary | ICD-10-CM | POA: Diagnosis not present

## 2016-06-07 DIAGNOSIS — Z4789 Encounter for other orthopedic aftercare: Secondary | ICD-10-CM | POA: Diagnosis not present

## 2016-06-07 DIAGNOSIS — T45515D Adverse effect of anticoagulants, subsequent encounter: Secondary | ICD-10-CM | POA: Diagnosis not present

## 2016-06-07 DIAGNOSIS — I959 Hypotension, unspecified: Secondary | ICD-10-CM | POA: Diagnosis not present

## 2016-06-07 DIAGNOSIS — D6832 Hemorrhagic disorder due to extrinsic circulating anticoagulants: Secondary | ICD-10-CM | POA: Diagnosis not present

## 2016-06-07 DIAGNOSIS — Z7901 Long term (current) use of anticoagulants: Secondary | ICD-10-CM | POA: Diagnosis not present

## 2016-06-07 DIAGNOSIS — I110 Hypertensive heart disease with heart failure: Secondary | ICD-10-CM | POA: Diagnosis not present

## 2016-06-07 DIAGNOSIS — M810 Age-related osteoporosis without current pathological fracture: Secondary | ICD-10-CM | POA: Diagnosis not present

## 2016-06-07 DIAGNOSIS — I447 Left bundle-branch block, unspecified: Secondary | ICD-10-CM | POA: Diagnosis not present

## 2016-06-07 LAB — POCT INR: INR: 1.5

## 2016-06-07 NOTE — Telephone Encounter (Signed)
Patient will only need to increase fluids for a couple of days. That should improve her renal function. Her labs showed she was slightly dry. With this plan she should be able to continue without needing to take her Lasix (just like things have been in the past). Should she need to take a Lasix she will need to take a KCl with it. Otherwise no KCl.

## 2016-06-07 NOTE — Telephone Encounter (Signed)
Spoke with [name] and reviewed recommendations from [employer] PA and she verbalized understanding. She states they will increase her fluids for a couple of days and closely monitor her weights. She verbalized understanding of our conversation and had no further questions at this time.

## 2016-06-10 DIAGNOSIS — D6832 Hemorrhagic disorder due to extrinsic circulating anticoagulants: Secondary | ICD-10-CM | POA: Diagnosis not present

## 2016-06-10 DIAGNOSIS — I482 Chronic atrial fibrillation: Secondary | ICD-10-CM | POA: Diagnosis not present

## 2016-06-10 DIAGNOSIS — I2510 Atherosclerotic heart disease of native coronary artery without angina pectoris: Secondary | ICD-10-CM | POA: Diagnosis not present

## 2016-06-10 DIAGNOSIS — I110 Hypertensive heart disease with heart failure: Secondary | ICD-10-CM | POA: Diagnosis not present

## 2016-06-10 DIAGNOSIS — M810 Age-related osteoporosis without current pathological fracture: Secondary | ICD-10-CM | POA: Diagnosis not present

## 2016-06-10 DIAGNOSIS — Z4789 Encounter for other orthopedic aftercare: Secondary | ICD-10-CM | POA: Diagnosis not present

## 2016-06-10 DIAGNOSIS — D62 Acute posthemorrhagic anemia: Secondary | ICD-10-CM | POA: Diagnosis not present

## 2016-06-10 DIAGNOSIS — I5023 Acute on chronic systolic (congestive) heart failure: Secondary | ICD-10-CM | POA: Diagnosis not present

## 2016-06-10 DIAGNOSIS — I447 Left bundle-branch block, unspecified: Secondary | ICD-10-CM | POA: Diagnosis not present

## 2016-06-10 DIAGNOSIS — T45515D Adverse effect of anticoagulants, subsequent encounter: Secondary | ICD-10-CM | POA: Diagnosis not present

## 2016-06-10 DIAGNOSIS — M1991 Primary osteoarthritis, unspecified site: Secondary | ICD-10-CM | POA: Diagnosis not present

## 2016-06-10 DIAGNOSIS — I959 Hypotension, unspecified: Secondary | ICD-10-CM | POA: Diagnosis not present

## 2016-06-10 DIAGNOSIS — J449 Chronic obstructive pulmonary disease, unspecified: Secondary | ICD-10-CM | POA: Diagnosis not present

## 2016-06-14 ENCOUNTER — Other Ambulatory Visit (INDEPENDENT_AMBULATORY_CARE_PROVIDER_SITE_OTHER): Payer: PPO

## 2016-06-14 DIAGNOSIS — M1991 Primary osteoarthritis, unspecified site: Secondary | ICD-10-CM | POA: Diagnosis not present

## 2016-06-14 DIAGNOSIS — I482 Chronic atrial fibrillation, unspecified: Secondary | ICD-10-CM

## 2016-06-14 DIAGNOSIS — T45515D Adverse effect of anticoagulants, subsequent encounter: Secondary | ICD-10-CM | POA: Diagnosis not present

## 2016-06-14 DIAGNOSIS — D62 Acute posthemorrhagic anemia: Secondary | ICD-10-CM | POA: Diagnosis not present

## 2016-06-14 DIAGNOSIS — Z7901 Long term (current) use of anticoagulants: Secondary | ICD-10-CM | POA: Diagnosis not present

## 2016-06-14 DIAGNOSIS — I959 Hypotension, unspecified: Secondary | ICD-10-CM | POA: Diagnosis not present

## 2016-06-14 DIAGNOSIS — I110 Hypertensive heart disease with heart failure: Secondary | ICD-10-CM | POA: Diagnosis not present

## 2016-06-14 DIAGNOSIS — I5023 Acute on chronic systolic (congestive) heart failure: Secondary | ICD-10-CM | POA: Diagnosis not present

## 2016-06-14 DIAGNOSIS — I447 Left bundle-branch block, unspecified: Secondary | ICD-10-CM | POA: Diagnosis not present

## 2016-06-14 DIAGNOSIS — Z4789 Encounter for other orthopedic aftercare: Secondary | ICD-10-CM | POA: Diagnosis not present

## 2016-06-14 DIAGNOSIS — J449 Chronic obstructive pulmonary disease, unspecified: Secondary | ICD-10-CM | POA: Diagnosis not present

## 2016-06-14 DIAGNOSIS — D6832 Hemorrhagic disorder due to extrinsic circulating anticoagulants: Secondary | ICD-10-CM | POA: Diagnosis not present

## 2016-06-14 DIAGNOSIS — I2510 Atherosclerotic heart disease of native coronary artery without angina pectoris: Secondary | ICD-10-CM | POA: Diagnosis not present

## 2016-06-14 DIAGNOSIS — M810 Age-related osteoporosis without current pathological fracture: Secondary | ICD-10-CM | POA: Diagnosis not present

## 2016-06-14 LAB — POCT INR: INR: 1.7

## 2016-06-15 ENCOUNTER — Telehealth: Payer: Self-pay | Admitting: Family Medicine

## 2016-06-15 NOTE — Telephone Encounter (Signed)
Pt daughter would like clarification on coumadin result.  Please call cell number Thank you

## 2016-06-15 NOTE — Telephone Encounter (Signed)
Answered daughter's questions about coumadin dosing and confirmed her appt date

## 2016-06-17 DIAGNOSIS — M810 Age-related osteoporosis without current pathological fracture: Secondary | ICD-10-CM | POA: Diagnosis not present

## 2016-06-17 DIAGNOSIS — M1991 Primary osteoarthritis, unspecified site: Secondary | ICD-10-CM | POA: Diagnosis not present

## 2016-06-17 DIAGNOSIS — D62 Acute posthemorrhagic anemia: Secondary | ICD-10-CM | POA: Diagnosis not present

## 2016-06-17 DIAGNOSIS — I5023 Acute on chronic systolic (congestive) heart failure: Secondary | ICD-10-CM | POA: Diagnosis not present

## 2016-06-17 DIAGNOSIS — I110 Hypertensive heart disease with heart failure: Secondary | ICD-10-CM | POA: Diagnosis not present

## 2016-06-17 DIAGNOSIS — I2510 Atherosclerotic heart disease of native coronary artery without angina pectoris: Secondary | ICD-10-CM | POA: Diagnosis not present

## 2016-06-17 DIAGNOSIS — I447 Left bundle-branch block, unspecified: Secondary | ICD-10-CM | POA: Diagnosis not present

## 2016-06-17 DIAGNOSIS — I482 Chronic atrial fibrillation: Secondary | ICD-10-CM | POA: Diagnosis not present

## 2016-06-17 DIAGNOSIS — J449 Chronic obstructive pulmonary disease, unspecified: Secondary | ICD-10-CM | POA: Diagnosis not present

## 2016-06-17 DIAGNOSIS — T45515D Adverse effect of anticoagulants, subsequent encounter: Secondary | ICD-10-CM | POA: Diagnosis not present

## 2016-06-17 DIAGNOSIS — Z4789 Encounter for other orthopedic aftercare: Secondary | ICD-10-CM | POA: Diagnosis not present

## 2016-06-17 DIAGNOSIS — I959 Hypotension, unspecified: Secondary | ICD-10-CM | POA: Diagnosis not present

## 2016-06-17 DIAGNOSIS — D6832 Hemorrhagic disorder due to extrinsic circulating anticoagulants: Secondary | ICD-10-CM | POA: Diagnosis not present

## 2016-06-28 DIAGNOSIS — M791 Myalgia: Secondary | ICD-10-CM | POA: Diagnosis not present

## 2016-06-28 DIAGNOSIS — M48061 Spinal stenosis, lumbar region without neurogenic claudication: Secondary | ICD-10-CM | POA: Diagnosis not present

## 2016-07-01 ENCOUNTER — Other Ambulatory Visit (INDEPENDENT_AMBULATORY_CARE_PROVIDER_SITE_OTHER): Payer: PPO

## 2016-07-01 ENCOUNTER — Other Ambulatory Visit: Payer: PPO

## 2016-07-01 DIAGNOSIS — Z7901 Long term (current) use of anticoagulants: Secondary | ICD-10-CM

## 2016-07-01 DIAGNOSIS — I482 Chronic atrial fibrillation, unspecified: Secondary | ICD-10-CM

## 2016-07-01 LAB — POCT INR: INR: 2.1

## 2016-07-27 ENCOUNTER — Other Ambulatory Visit: Payer: Self-pay | Admitting: Family Medicine

## 2016-07-29 ENCOUNTER — Other Ambulatory Visit (INDEPENDENT_AMBULATORY_CARE_PROVIDER_SITE_OTHER): Payer: PPO

## 2016-07-29 DIAGNOSIS — I482 Chronic atrial fibrillation, unspecified: Secondary | ICD-10-CM

## 2016-07-29 DIAGNOSIS — Z7901 Long term (current) use of anticoagulants: Secondary | ICD-10-CM | POA: Diagnosis not present

## 2016-07-29 LAB — POCT INR: INR: 1.6

## 2016-08-02 ENCOUNTER — Telehealth: Payer: Self-pay | Admitting: Family Medicine

## 2016-08-02 NOTE — Telephone Encounter (Signed)
Pt daughter called and requesting a cb concerning her mother's coumadin results. [phone]

## 2016-08-02 NOTE — Telephone Encounter (Signed)
This Probation officer returned daughter's call at 5:28pm.  No answer, LM on voicemail and asked daughter to call me again tomorrow if still has questions.

## 2016-08-03 NOTE — Telephone Encounter (Signed)
Spoke with patient's daughter [name] this am.  She wanted to be sure it was okay for mom to continue with the granola sprinkle.  I informed her that granola should be fine as long as it is not a medical fortified supplement or contains certain vitamins/minerals that may interact with coumadin.  Daughter states that it is just a plain granola.    She also mentioned that mom is having some further bruising, but it is not new for her and denies any active bleeding concerns or other symptoms.  I educated her that if unusual bleeding/ bruising occurs to got to ER and let us know immediately.  If concerned, I did offer to bring patient in this week or first of next week to check INR before appt on 12/1.  Daughter states that there are no unusual findings but understands what to do if they develop.    [name] (daughter) did state that mom has dropped her pills recently and bruised hand trying to dig out of her recliner.  This may be contributing to her recent subtherapeutic readings.  She will have mom start taking medication at the kitchen table.  I, also, suggested that she even pour that time slots med into a colored dish/saucer to take them from versus trying to pour in her hand while balancing the pill box with the other hand, etc...  Daughter verbalizes understanding and will call back if any further questions or concerns.

## 2016-08-10 ENCOUNTER — Other Ambulatory Visit: Payer: Self-pay | Admitting: Family Medicine

## 2016-08-13 ENCOUNTER — Ambulatory Visit (INDEPENDENT_AMBULATORY_CARE_PROVIDER_SITE_OTHER): Payer: PPO

## 2016-08-13 DIAGNOSIS — Z7901 Long term (current) use of anticoagulants: Secondary | ICD-10-CM | POA: Diagnosis not present

## 2016-08-13 LAB — POCT INR: INR: 1.7

## 2016-08-13 NOTE — Patient Instructions (Signed)
Pre visit review using our clinic review tool, if applicable. No additional management support is needed unless otherwise documented below in the visit note. 

## 2016-08-24 ENCOUNTER — Ambulatory Visit (INDEPENDENT_AMBULATORY_CARE_PROVIDER_SITE_OTHER): Payer: PPO

## 2016-08-24 ENCOUNTER — Other Ambulatory Visit: Payer: PPO

## 2016-08-24 ENCOUNTER — Ambulatory Visit: Payer: PPO

## 2016-08-24 DIAGNOSIS — Z7901 Long term (current) use of anticoagulants: Secondary | ICD-10-CM | POA: Diagnosis not present

## 2016-08-24 DIAGNOSIS — Z5181 Encounter for therapeutic drug level monitoring: Secondary | ICD-10-CM | POA: Diagnosis not present

## 2016-08-24 DIAGNOSIS — I482 Chronic atrial fibrillation, unspecified: Secondary | ICD-10-CM

## 2016-08-24 LAB — POCT INR: INR: 1.7

## 2016-08-24 NOTE — Patient Instructions (Signed)
Pre visit review using our clinic review tool, if applicable. No additional management support is needed unless otherwise documented below in the visit note.  INR today 1.7  Patient present with sister, [name].  Reviewed with patient dietary recommendations and patient to decrease frequency and amount of green consumption.  She denies any other changes to health or medication regimen that she or her sister are aware of.  She does continue to note increase in bruising; however, face, arms and legs assessed by this writer to be normal bruising related to use of coumadin therapy.  No reports of abnormal or uncontrollable bleeding or blood in stool.  Patient is to take an extra 1/2 pill today 12/12 (10mg ) and then increase weekly dosing starting tomorrow to 7.5mg  daily except for 5mg  on Sunday and Friday and recheck in 2 weeks.  Discussed with sister, [name], and gave handout with instructions for her to share with [name], who manages patients meds at home.  [name] to call back if she has any questions or concerns.  This Probation officer did review risks of subtherapeutic level with patient and caregiver today with verbalized understanding.

## 2016-08-26 ENCOUNTER — Encounter: Payer: Self-pay | Admitting: Cardiovascular Disease

## 2016-08-26 ENCOUNTER — Ambulatory Visit (INDEPENDENT_AMBULATORY_CARE_PROVIDER_SITE_OTHER): Payer: PPO | Admitting: Cardiovascular Disease

## 2016-08-26 VITALS — BP 118/70 | HR 72 | Ht 69.0 in | Wt 128.0 lb

## 2016-08-26 DIAGNOSIS — I5043 Acute on chronic combined systolic (congestive) and diastolic (congestive) heart failure: Secondary | ICD-10-CM

## 2016-08-26 DIAGNOSIS — I482 Chronic atrial fibrillation, unspecified: Secondary | ICD-10-CM

## 2016-08-26 DIAGNOSIS — I951 Orthostatic hypotension: Secondary | ICD-10-CM

## 2016-08-26 DIAGNOSIS — I10 Essential (primary) hypertension: Secondary | ICD-10-CM

## 2016-08-26 DIAGNOSIS — I481 Persistent atrial fibrillation: Secondary | ICD-10-CM

## 2016-08-26 DIAGNOSIS — I272 Pulmonary hypertension, unspecified: Secondary | ICD-10-CM | POA: Diagnosis not present

## 2016-08-26 DIAGNOSIS — I4819 Other persistent atrial fibrillation: Secondary | ICD-10-CM

## 2016-08-26 NOTE — Progress Notes (Signed)
Cardiology Office Note  Date:  08/26/2016   ID:  [name], DOB Sep 26, 1921, MRN [id number]  PCP:  Loura Pardon, MD   Chief Complaint  Patient presents with  . other    3 month f/u c/o chest pain . Meds reviewed verbally with pt.    HPI:  82 y.o. female with h/o coronary artery disease, atrial fibrillation on Coumadin, chronic combined systolic and diastolic CHF, mitral regurgitation, tricuspid regurgitation, prolonged QT, LBBB, carotid disease, HTN, thrombocytopenia, Moderate pulmonary hypertension, ejection fraction 30-35% Prior stress test with no ischemia Previous history of orthostatic hypotension, takes midodrine twice a day with improved symptoms She presents today for follow-up of her acute on chronic systolic and diastolic CHF and pulmonary hypertension   BP 110 to 150 range, Cough and cold, Diuretic as needed for weight gain, took one last week Weight slowly trending up over the past several months Was 125 in October, now 128 pounds She denies any PND, orthopnea, shortness of breath on exertion, no leg edema Trying to eat more to gain weight  glass of water with meals Underwent lumbar back surgery over the summer 2017, reports that she did well Pain has improved Limited mobility  She reports having some sinus congestion, cough, postnasal drip Taking Mucinex  EKG on today's visit shows atrial fibrillation with left bundle branch block, ventricular rate 72 bpm  Other past medical history  presented to the ED on 04/07/15 with complaints of nausea, bloody emesis, burning chest pain that extended into the neck, shortness of breath, and worsening weakness and  productive cough.  Echocardiogram at the time showed moderate pulmonary hypertension, significant TR, ejection fraction was reduced 30-35% Outpatient stress test showed no ischemia, anteroseptal perfusion defect consistent with bundle branch block She is currently living at [facility] She does take  midodrine in the morning and evening for orthostatic hypotension   Echo 04/07/15 showed normal LV size, EF 30-35% (down from Echo on 02/28/13: EF 50-55%), hypokenesis of the anteroseptal myocardium, MV moderate regurg, mildly dilated left atrium and right ventricle, moderate to severe TR, and PA peak pressure 57 mmHg.   PMH:   has a past medical history of Allergy; Anemia; Arthritis; CAD (coronary artery disease); Chronic atrial fibrillation ([place]); Compression fracture of lumbar vertebra (HCC) (05-2008); COPD (chronic obstructive pulmonary disease) ([place]); Diverticulosis of colon; Dyslipidemia; Essential hypertension; Family history of adverse reaction to anesthesia; GERD (gastroesophageal reflux disease); Hard of hearing; Hematemesis; colonoscopy; Hyperlipidemia; Hypertension; Hypothyroid; Ischemic cardiomyopathy; LBBB (left bundle branch block); Lung abnormality; Mitral regurgitation; Myocardial infarction (2016); Nausea; Nocturia; OA (osteoarthritis); Orthostatic hypotension; Osteoporosis; Pleural effusion associated with pulmonary infection (03-2007); Prolonged QT interval; Pulmonary hypertension; Shingles (2014); Thrombocytopenia ([place]); TR (tricuspid regurgitation); Urinary frequency; Vitamin B deficiency; Warfarin anticoagulation; Wears dentures; Wears glasses; and Weight loss.  PSH:    Past Surgical History:  Procedure Laterality Date  . COLONOSCOPY    . CORONARY ANGIOPLASTY  2004  . LUMBAR LAMINECTOMY/DECOMPRESSION MICRODISCECTOMY N/A 03/23/2016   Procedure: Lumbar three-four Laminectomy/Foraminotomy, posterior laterial arthrodesis;  Surgeon: Leeroy Cha, MD;  Location: [facility] NEURO ORS;  Service: Neurosurgery;  Laterality: N/A;  L3-4 Laminectomy/Foraminotomy  . THYROIDECTOMY  1962  . TONSILLECTOMY      Current Outpatient Prescriptions  Medication Sig Dispense Refill  . calcium carbonate (OS-CAL) 600 MG TABS tablet Take 1,200 mg by mouth every evening.     . fluticasone (FLONASE) 50 MCG/ACT  nasal spray 2 SPRAYS IN BOTH NOSTRILS DAILY 16 g 5  . furosemide (LASIX) 20 MG tablet  Take 1 tablet (20 mg total) by mouth daily as needed. (Patient taking differently: Take 20 mg by mouth daily as needed for fluid. Daily weight check) 90 tablet 3  . Glucosamine HCl 500 MG TABS Take 500 mg by mouth daily.     [name] guaifenesin (HUMIBID E) 400 MG TABS tablet Take 400 mg by mouth every 4 (four) hours.    [name] levothyroxine (SYNTHROID, LEVOTHROID) 88 MCG tablet TAKE 1 TABLET BY MOUTH DAILY 90 tablet 0  . lisinopril (PRINIVIL,ZESTRIL) 2.5 MG tablet TAKE ONE TABLET AT BEDTIME (8PM) (Patient taking differently: Take 2.5 mg by mouth at bedtime. BEDTIME (8PM)) 90 tablet 3  . Lutein 6 MG CAPS Take 1 capsule by mouth daily.     . metoprolol tartrate (LOPRESSOR) 25 MG tablet Take 0.5 tablets (12.5 mg total) by mouth 2 (two) times daily. (Patient taking differently: Take 25 mg by mouth 2 (two) times daily. ) 180 tablet 3  . midodrine (PROAMATINE) 5 MG tablet TAKE ONE TABLET BY MOUTH TWICE DAILY WITH A MEAL 180 tablet 3  . mirtazapine (REMERON) 15 MG tablet Take 1 tablet (15 mg total) by mouth at bedtime. 90 tablet 3  . Multiple Vitamin (MULTIVITAMIN) tablet Take 1 tablet by mouth daily.     . pantoprazole (PROTONIX) 40 MG tablet TAKE 1 TABLET BY MOUTH DAILY 90 tablet 1  . senna-docusate (SENOKOT-S) 8.6-50 MG per tablet Take 1 tablet by mouth at bedtime.    . simvastatin (ZOCOR) 20 MG tablet TAKE 1/2 TABLET  (10MG   TOTAL) EVERY EVENING (Patient taking differently: Take 5 mg by mouth every evening. ) 45 tablet 1  . vitamin B-12 (CYANOCOBALAMIN) 1000 MCG tablet Take 1 tablet (1,000 mcg total) by mouth daily. 30 tablet 0  . warfarin (COUMADIN) 5 MG tablet TAKE AS DIRECTED BY ANTI-COAGULATION CLINIC 120 tablet 0   No current facility-administered medications for this visit.      Allergies:   Sulfonamide derivatives and Alendronate sodium   Social History:  The patient  reports that she quit smoking about 68 years  ago. She has never used smokeless tobacco. She reports that she does not drink alcohol or use drugs.   Family History:   family history includes Cancer in her brother; Diabetes in her brother; Heart disease in her sister; Heart disease (age of onset: 55) in her mother; Leukemia in her sister; Stroke (age of onset: 78) in her father.    Review of Systems: Review of Systems  Constitutional: Negative.   Respiratory: Positive for cough.   Cardiovascular: Negative.   Gastrointestinal: Negative.   Musculoskeletal: Negative.   Neurological: Negative.   Psychiatric/Behavioral: Negative.   All other systems reviewed and are negative.    PHYSICAL EXAM: VS:  BP 118/70 (BP Location: Right Arm, Patient Position: Sitting, Cuff Size: Normal)   Pulse 72   Ht 5\' 9"  (1.753 m)   Wt 128 lb (58.1 kg)   BMI 18.90 kg/m  , BMI Body mass index is 18.9 kg/m. GEN: Well nourished, well developed, in no acute distress  HEENT: normal  Neck: no JVD, carotid bruits, or masses Cardiac: RRR; no murmurs, rubs, or gallops,no edema  Respiratory:  clear to auscultation bilaterally, normal work of breathing GI: soft, nontender, nondistended, + BS MS: no deformity or atrophy  Skin: warm and dry, no rash Neuro:  Strength and sensation are intact Psych: euthymic mood, full affect    Recent Labs: 03/31/2016: B Natriuretic Peptide 616.2 04/28/2016: ALT 8; Hemoglobin 11.9 05/28/2016: Platelets  WILL FOLLOW 06/03/2016: BUN 22; Creatinine, Ser 1.23; Potassium 4.5; Sodium 140    Lipid Panel Lab Results  Component Value Date   CHOL 147 05/12/2015   HDL 46.00 05/12/2015   LDLCALC 83 05/12/2015   TRIG 91.0 05/12/2015      Wt Readings from Last 3 Encounters:  08/26/16 128 lb (58.1 kg)  05/28/16 122 lb 2 oz (55.4 kg)  04/28/16 117 lb 2 oz (53.1 kg)       ASSESSMENT AND PLAN:  Essential hypertension - Plan: EKG 12-Lead Blood pressure is well controlled on today's visit. No changes made to the  medications.  Acute on chronic combined systolic and diastolic heart failure (HCC) As below, appears euvolemic. Long discussion concerning when to take her Lasix  Orthostatic hypotension - Plan: EKG 12-Lead She has been on midodrine since I first saw her in clinic November 2016, had orthostatic hypotension. No changes to medications at this time as she is relatively asymptomatic  Pulmonary hypertension - Plan: EKG 12-Lead Minimal change in weight, clinically appears euvolemic Recommended Lasix only as needed for shortness of breath, ankle swelling She denies either at this time  Chronic atrial fibrillation (HCC) - Plan: EKG 12-Lead Rate well controlled, tolerating anticoagulation  We'll continue metoprolol low-dose     Total encounter time more than 25 minutes  Greater than 50% was spent in counseling and coordination of care with the patient   Disposition:   F/U  6 months   Orders Placed This Encounter  Procedures  . EKG 12-Lead     Signed, Esmond Plants, M.D., Ph.D. 08/26/2016  [place], [place]

## 2016-08-26 NOTE — Patient Instructions (Signed)

## 2016-08-31 ENCOUNTER — Encounter: Payer: PPO | Admitting: Family Medicine

## 2016-09-03 ENCOUNTER — Ambulatory Visit (INDEPENDENT_AMBULATORY_CARE_PROVIDER_SITE_OTHER): Payer: PPO

## 2016-09-03 DIAGNOSIS — I482 Chronic atrial fibrillation, unspecified: Secondary | ICD-10-CM

## 2016-09-03 DIAGNOSIS — Z5181 Encounter for therapeutic drug level monitoring: Secondary | ICD-10-CM | POA: Diagnosis not present

## 2016-09-03 DIAGNOSIS — Z7901 Long term (current) use of anticoagulants: Secondary | ICD-10-CM

## 2016-09-03 LAB — POCT INR: INR: 2.3

## 2016-09-03 NOTE — Patient Instructions (Signed)
Pre visit review using our clinic review tool, if applicable. No additional management support is needed unless otherwise documented below in the visit note. 

## 2016-09-28 ENCOUNTER — Ambulatory Visit (INDEPENDENT_AMBULATORY_CARE_PROVIDER_SITE_OTHER): Payer: PPO | Admitting: Family Medicine

## 2016-09-28 ENCOUNTER — Ambulatory Visit (INDEPENDENT_AMBULATORY_CARE_PROVIDER_SITE_OTHER): Payer: PPO

## 2016-09-28 ENCOUNTER — Encounter: Payer: Self-pay | Admitting: Family Medicine

## 2016-09-28 ENCOUNTER — Ambulatory Visit (INDEPENDENT_AMBULATORY_CARE_PROVIDER_SITE_OTHER)
Admission: RE | Admit: 2016-09-28 | Discharge: 2016-09-28 | Disposition: A | Discharge status: Home / Self Care | Payer: PPO | Source: Ambulatory Visit | Attending: Family Medicine | Admitting: Family Medicine

## 2016-09-28 VITALS — BP 106/64 | HR 76 | Temp 97.7°F | Ht 67.0 in

## 2016-09-28 DIAGNOSIS — I481 Persistent atrial fibrillation: Secondary | ICD-10-CM

## 2016-09-28 DIAGNOSIS — E538 Deficiency of other specified B group vitamins: Secondary | ICD-10-CM

## 2016-09-28 DIAGNOSIS — D518 Other vitamin B12 deficiency anemias: Secondary | ICD-10-CM | POA: Diagnosis not present

## 2016-09-28 DIAGNOSIS — I5043 Acute on chronic combined systolic (congestive) and diastolic (congestive) heart failure: Secondary | ICD-10-CM

## 2016-09-28 DIAGNOSIS — R918 Other nonspecific abnormal finding of lung field: Secondary | ICD-10-CM | POA: Diagnosis not present

## 2016-09-28 DIAGNOSIS — R0989 Other specified symptoms and signs involving the circulatory and respiratory systems: Secondary | ICD-10-CM

## 2016-09-28 DIAGNOSIS — D696 Thrombocytopenia, unspecified: Secondary | ICD-10-CM

## 2016-09-28 DIAGNOSIS — N179 Acute kidney failure, unspecified: Secondary | ICD-10-CM

## 2016-09-28 DIAGNOSIS — K219 Gastro-esophageal reflux disease without esophagitis: Secondary | ICD-10-CM

## 2016-09-28 DIAGNOSIS — I10 Essential (primary) hypertension: Secondary | ICD-10-CM

## 2016-09-28 DIAGNOSIS — Z7901 Long term (current) use of anticoagulants: Secondary | ICD-10-CM

## 2016-09-28 DIAGNOSIS — I200 Unstable angina: Secondary | ICD-10-CM

## 2016-09-28 DIAGNOSIS — I482 Chronic atrial fibrillation, unspecified: Secondary | ICD-10-CM

## 2016-09-28 DIAGNOSIS — Z5181 Encounter for therapeutic drug level monitoring: Secondary | ICD-10-CM

## 2016-09-28 DIAGNOSIS — I4819 Other persistent atrial fibrillation: Secondary | ICD-10-CM

## 2016-09-28 DIAGNOSIS — I5032 Chronic diastolic (congestive) heart failure: Secondary | ICD-10-CM

## 2016-09-28 LAB — POCT INR: INR: 2.4

## 2016-09-28 MED ORDER — CYANOCOBALAMIN 1000 MCG/ML IJ SOLN
1000.0000 ug | Freq: Once | INTRAMUSCULAR | Status: AC
  Administered 2016-09-28: 1000 ug via INTRAMUSCULAR

## 2016-09-28 NOTE — Assessment & Plan Note (Addendum)
bp in fair control at this time  BP Readings from Last 1 Encounters:  09/28/16 106/64   No changes needed Disc lifstyle change with low sodium diet and exercise  Pt states she is not on lisinopril- unsure when this was d/c Will check with cardiology It remains on her med list Has had some orthostatic hypotension in the past

## 2016-09-28 NOTE — Assessment & Plan Note (Signed)
No new bruising or bleeding

## 2016-09-28 NOTE — Progress Notes (Signed)
Subjective:    Patient ID: [name], female    DOB: [birth year], 83 y.o.   MRN: [id number]  HPI Here for chest congestion in pt with hx of CHF and pulm HTN  She takes lasix 20 mg as needed Some swelling in ankles   Per family Dr Rockey Situ stopped her lisinopril , but it is still on her med list and I do not see evidence of this  Has had orthostatic hypotension -has midodrine   Also more weak  Had a B12 shot today  Vomited Sunday night- tasted sour  Several nights ago had to sit up to breathe -slept in recliner  No pain  Gave her lasix once per week (planned to give lasix tonight and K if needed)  Just has not been feeling well  No HA No fever   Takes protonix for gerd 40 mg   Diet - has salads 3 d per week  Avoids tomatoes for acid  No spicy food  Sometimes she does not get back into her room from eating until 6:30    Wt is up 6lb   Sob on walking from chair to bed  ? If crackles in the lungs  Saw Dr Rockey Situ on dec 12  Also some cold symptoms  Coughing some since the middle of dec  Taking regular mucinex -then stopped it  "something rises up in her throat" Phlegm is white and foamy / occ yellow   Hx of anemia  Last Hb 11.9     Chemistry      Component Value Date/Time   NA 140 06/03/2016 1441   NA 143 05/28/2016 1109   NA 144 10/28/2011 1315   NA 144 04/15/2009 1032   K 4.5 06/03/2016 1441   K 4.5 10/28/2011 1315   K 5.7 (H) 04/15/2009 1032   CL 109 06/03/2016 1441   CL 106 10/28/2011 1315   CL 107 04/15/2009 1032   CO2 24 06/03/2016 1441   CO2 29 10/28/2011 1315   CO2 33 04/15/2009 1032   BUN 22 (H) 06/03/2016 1441   BUN 19 05/28/2016 1109   BUN 26 (H) 10/28/2011 1315   BUN 15 04/15/2009 1032   CREATININE 1.23 (H) 06/03/2016 1441   CREATININE 1.12 10/28/2011 1315   CREATININE 0.9 04/15/2009 1032      Component Value Date/Time   CALCIUM 9.1 06/03/2016 1441   CALCIUM 8.8 10/28/2011 1315   CALCIUM 9.0 04/15/2009 1032   ALKPHOS 57 04/28/2016  1222   ALKPHOS 51 01/28/2009 1252   AST 19 04/28/2016 1222   AST 32 01/28/2009 1252   ALT 8 04/28/2016 1222   ALT 24 01/28/2009 1252   BILITOT 0.7 04/28/2016 1222   BILITOT 0.90 01/28/2009 1252       Wt Readings from Last 3 Encounters:  08/26/16 128 lb (58.1 kg)  05/28/16 122 lb 2 oz (55.4 kg)  04/28/16 117 lb 2 oz (53.1 kg)   BP Readings from Last 3 Encounters:  09/28/16 106/64  08/26/16 118/70  05/28/16 110/70   Pulse Readings from Last 3 Encounters:  09/28/16 76  08/26/16 72  05/28/16 76     Noted when she went for her INR today  Lab Results  Component Value Date   INR 2.4 09/28/2016   INR 2.3 09/03/2016   INR 1.7 08/24/2016   Pulse ox 96% ra  Patient Active Problem List   Diagnosis Date Noted  . Chest congestion 09/28/2016  . Encounter for therapeutic drug monitoring  08/24/2016  . Long-term (current) use of anticoagulants 08/24/2016  . Acute on chronic systolic congestive heart failure ([place])   . Acute blood loss anemia 03/24/2016  . Surgery, elective   . Arterial hypotension   . Lumbar stenosis with neurogenic claudication 03/23/2016  . Adjustment disorder with mixed anxiety and depressed mood 06/09/2015  . Generalized weakness 05/12/2015  . Chronic systolic CHF (congestive heart failure) ([place]) 04/29/2015  . Cardiomyopathy, ischemic   . Acute systolic CHF (congestive heart failure) ([place])   . Pulmonary hypertension   . Adult failure to thrive   . Chronic atrial fibrillation ([place])   . Unstable angina ([place]) 04/07/2015  . Right sided sciatica 10/25/2014  . Venous stasis dermatitis 06/19/2014  . Varicose veins of lower extremities with inflammation 03/25/2014  . Chronic diastolic CHF (congestive heart failure) ([place]) 01/04/2014  . Orthostatic hypotension   . Acute on chronic combined systolic and diastolic heart failure ([place]) 03/02/2013  . Acute renal failure ([place]) 02/28/2013  . Shingles 02/28/2013  . History of shingles 02/28/2013  . Nail fungus  01/10/2013  . Hyperlipidemia 07/09/2011  . Vision blurred 07/09/2011  . Hearing loss 07/09/2011  . Weight loss   . A-fib ([place])   . Tachycardia   . CAD (coronary artery disease)   . Mitral regurgitation   . SOB (shortness of breath)   . LBBB (left bundle branch block)   . Warfarin anticoagulation   . Ejection fraction   . TR (tricuspid regurgitation)   . Dyslipidemia   . Lung abnormality   . Prolonged QT interval   . Coccydynia 12/03/2010  . Nonspecific (abnormal) findings on radiological and other examination of body structure 07/13/2010  . ABNORMAL CHEST XRAY 07/13/2010  . Shortness of breath 06/18/2010  . Essential hypertension 06/24/2009  . GOUT, UNSPECIFIED 03/20/2009  . GERD 03/18/2009  . THROMBOCYTOPENIA 01/03/2009  . ALLERGIC RHINITIS 12/05/2008  . HOARSENESS 06/07/2008  . MEDIASTINAL LYMPHADENOPATHY 05/13/2008  . Other diseases of lung, not elsewhere classified 04/18/2008  . Venous (peripheral) insufficiency 09/13/2007  . FREQUENCY, URINARY 07/19/2007  . VERTIGO, BENIGN PAROXYSMAL POSITION 05/11/2007  . TINNITUS NOS 05/11/2007  . OSTEOPOROSIS NOS 05/11/2007  . HYPOTHYROIDISM 04/11/2007  . ANEMIA, B12 DEFICIENCY 04/11/2007  . HYPOXEMIA 04/11/2007   Past Medical History:  Diagnosis Date  . Allergy   . Anemia   . Arthritis    osteo  . CAD (coronary artery disease)    a. 2 DES, [facility], 2004; b. nuc 2013 septal dyssenergy 2/2 LBBB, nl LV fxn;  c. 06/2015 MV: EF 41%, septal HK (LBBB), no ischemia->Low risk.  . Chronic atrial fibrillation (HCC)    a. on Coumadin (CHA2DS2VASc = 6); b. Holter 06/2010, no brady, mild tachy.  . Compression fracture of lumbar vertebra ([place]) 05-2008  . COPD (chronic obstructive pulmonary disease) ([place])   . Diverticulosis of colon   . Dyslipidemia   . Essential hypertension   . Family history of adverse reaction to anesthesia    daughter PONV  . GERD (gastroesophageal reflux disease)   . Hard of hearing   . Hematemesis    a. 03/2015.    [name] Hx of colonoscopy   . Hyperlipidemia   . Hypertension   . Hypothyroid   . Ischemic cardiomyopathy    a. 03/2015 Echo: EF 30-35%, ant, antsept HK, mod MR, mildly dil LA/RV, mod dil RA, mod-sev TR, PASP 15mmHg.  [name] LBBB (left bundle branch block)   . Lung abnormality    Dr [name] 122/2011 no further  w/u needed  . Mitral regurgitation   . Myocardial infarction 2016   "mild heart attack"  . Nausea    Some nausea with medications, May, 2013,  . Nocturia   . OA (osteoarthritis)   . Orthostatic hypotension    June, 2014  . Osteoporosis   . Pleural effusion associated with pulmonary infection 03-2007  . Prolonged QT interval    when on sotolol in past  . Pulmonary hypertension    a. 03/2015 PASP 55mmHg by echo.  . Shingles 2014  . Thrombocytopenia ([place])   . TR (tricuspid regurgitation)    moderate, echo 2008  . Urinary frequency   . Vitamin B deficiency   . Warfarin anticoagulation   . Wears dentures   . Wears glasses   . Weight loss    August, 2012   Past Surgical History:  Procedure Laterality Date  . COLONOSCOPY    . CORONARY ANGIOPLASTY  2004  . LUMBAR LAMINECTOMY/DECOMPRESSION MICRODISCECTOMY N/A 03/23/2016   Procedure: Lumbar three-four Laminectomy/Foraminotomy, posterior laterial arthrodesis;  Surgeon: Leeroy Cha, MD;  Location: [facility] NEURO ORS;  Service: Neurosurgery;  Laterality: N/A;  L3-4 Laminectomy/Foraminotomy  . THYROIDECTOMY  1962  . TONSILLECTOMY     Social History  Substance Use Topics  . Smoking status: Former Smoker    Quit date: 09/14/1947  . Smokeless tobacco: Never Used  . Alcohol use No   Family History  Problem Relation Age of Onset  . Heart disease Mother 71  . Stroke Father 31  . Heart disease Sister   . Cancer Brother     colon  . Diabetes Brother   . Leukemia Sister    Allergies  Allergen Reactions  . Sulfonamide Derivatives Nausea And Vomiting    REACTION: sick  . Alendronate Sodium Other (See Comments)    REACTION: GI   Current  Outpatient Prescriptions on File Prior to Visit  Medication Sig Dispense Refill  . calcium carbonate (OS-CAL) 600 MG TABS tablet Take 1,200 mg by mouth every evening.     . cyanocobalamin (,VITAMIN B-12,) 1000 MCG/ML injection Inject 1,000 mcg into the muscle once.    . fluticasone (FLONASE) 50 MCG/ACT nasal spray 2 SPRAYS IN BOTH NOSTRILS DAILY 16 g 5  . furosemide (LASIX) 20 MG tablet Take 1 tablet (20 mg total) by mouth daily as needed. (Patient taking differently: Take 20 mg by mouth daily as needed for fluid. Daily weight check) 90 tablet 3  . Glucosamine HCl 500 MG TABS Take 500 mg by mouth daily.     [name] guaifenesin (HUMIBID E) 400 MG TABS tablet Take 400 mg by mouth every 4 (four) hours.    [name] levothyroxine (SYNTHROID, LEVOTHROID) 88 MCG tablet TAKE 1 TABLET BY MOUTH DAILY 90 tablet 0  . lisinopril (PRINIVIL,ZESTRIL) 2.5 MG tablet TAKE ONE TABLET AT BEDTIME (8PM) (Patient taking differently: Take 2.5 mg by mouth at bedtime. BEDTIME (8PM)) 90 tablet 3  . Lutein 6 MG CAPS Take 1 capsule by mouth daily.     . metoprolol tartrate (LOPRESSOR) 25 MG tablet Take 0.5 tablets (12.5 mg total) by mouth 2 (two) times daily. (Patient taking differently: Take 25 mg by mouth 2 (two) times daily. ) 180 tablet 3  . midodrine (PROAMATINE) 5 MG tablet TAKE ONE TABLET BY MOUTH TWICE DAILY WITH A MEAL 180 tablet 3  . mirtazapine (REMERON) 15 MG tablet Take 1 tablet (15 mg total) by mouth at bedtime. 90 tablet 3  . Multiple Vitamin (MULTIVITAMIN) tablet Take 1 tablet  by mouth daily.     . pantoprazole (PROTONIX) 40 MG tablet TAKE 1 TABLET BY MOUTH DAILY 90 tablet 1  . senna-docusate (SENOKOT-S) 8.6-50 MG per tablet Take 1 tablet by mouth at bedtime.    . simvastatin (ZOCOR) 20 MG tablet TAKE 1/2 TABLET  (10MG   TOTAL) EVERY EVENING (Patient taking differently: Take 5 mg by mouth every evening. ) 45 tablet 1  . warfarin (COUMADIN) 5 MG tablet TAKE AS DIRECTED BY ANTI-COAGULATION CLINIC 120 tablet 0   No current  facility-administered medications on file prior to visit.     Review of Systems Review of Systems  Constitutional: Negative for fever, appetite change,  and unexpected weight change. pos for fatigue and generalized weakness lately  Eyes: Negative for pain and visual disturbance.  Respiratory: pos for sob on exertion/ mild cough prod of foamy material  Cardiovascular: Negative for cp or palpitations   pos for pedal edema Gastrointestinal: Negative for nausea, diarrhea and constipation.  Genitourinary: Negative for urgency and frequency.  Skin: Negative for pallor or rash   Neurological: Negative for weakness, light-headedness, numbness and headaches.  Hematological: Negative for adenopathy. Does not bruise/bleed easily.  Psychiatric/Behavioral: Negative for dysphoric mood. The patient is not nervous/anxious.         Objective:   Physical Exam  Constitutional: She appears well-developed and well-nourished. No distress.  Frail appearing elderly female in wheelchair   HENT:  Head: Normocephalic and atraumatic.  Mouth/Throat: Oropharynx is clear and moist.  Eyes: Conjunctivae and EOM are normal. Pupils are equal, round, and reactive to light. Right eye exhibits no discharge. Left eye exhibits no discharge. No scleral icterus.  Neck: Normal range of motion. Neck supple. No JVD present. No thyromegaly present.  Cardiovascular: Normal rate and normal heart sounds.  Exam reveals no gallop.   Pulmonary/Chest: Effort normal and breath sounds normal. No respiratory distress. She has no wheezes. She has no rales.  Bi basilar crackles   Abdominal: Soft. Bowel sounds are normal. She exhibits no distension. There is no tenderness.  Musculoskeletal: She exhibits edema.  One plus edema bilat-slt worse in L ankle No tenderness Baseline venous insuff and some old ecchymosis   Lymphadenopathy:    She has no cervical adenopathy.  Neurological: She is alert. She has normal reflexes. No cranial nerve  deficit. She exhibits normal muscle tone. Coordination normal.  Skin: Skin is warm and dry. No rash noted. No erythema. No pallor.  Psychiatric: She has a normal mood and affect.          Assessment & Plan:   Problem List Items Addressed This Visit      Cardiovascular and Mediastinum   A-fib ([place])    Chronic Good rate control Some s/s of fluid overload today  INR it tx       Acute on chronic combined systolic and diastolic heart failure (HCC) - Primary    Suspect pt's sob and crackles are due to fluid overload Also has pedal edema  Will dose lasix 20 mg daily for 3 d and report back watching weight carefully Pending rad review of cxr today as well        Chronic diastolic CHF (congestive heart failure) ([place])   Essential hypertension    bp in fair control at this time  BP Readings from Last 1 Encounters:  09/28/16 106/64   No changes needed Disc lifstyle change with low sodium diet and exercise  Pt states she is not on lisinopril- unsure when this was  d/c Will check with cardiology It remains on her med list Has had some orthostatic hypotension in the past       Unstable angina (HCC)    No symptoms of chest pressure        Respiratory   Chest congestion    I suspect pt is fluid overloaded  Pending rad review of cxr  Dose lasix 20 mg daily for 3 days and update  Watching pedal edema and wt as well  Update if s/s of infection       Relevant Orders   DG Chest 2 View     Digestive   GERD    Worse lately-poss exacerbating cough  On protonix 40 mg  Note written to raise head of the bed 30 deg if possible  Also avoid going to bed right after eating  Will update         Genitourinary   Acute renal failure ([place])    Rev last renal profile  Lab Results  Component Value Date   CREATININE 1.23 (H) 06/03/2016    Disc imp of water intake  Also may be fluid overloaded-will use lasix with caution         Other   ANEMIA, B12 DEFICIENCY    More  fatigued  Shot today       RESOLVED: Vitamin B12 deficiency   Warfarin anticoagulation    INR is therapeutic today No changes

## 2016-09-28 NOTE — Assessment & Plan Note (Signed)
Rev last renal profile  Lab Results  Component Value Date   CREATININE 1.23 (H) 06/03/2016    Disc imp of water intake  Also may be fluid overloaded-will use lasix with caution

## 2016-09-28 NOTE — Progress Notes (Signed)
Pre visit review using our clinic review tool, if applicable. No additional management support is needed unless otherwise documented below in the visit note. 

## 2016-09-28 NOTE — Assessment & Plan Note (Signed)
I suspect pt is fluid overloaded  Pending rad review of cxr  Dose lasix 20 mg daily for 3 days and update  Watching pedal edema and wt as well  Update if s/s of infection

## 2016-09-28 NOTE — Assessment & Plan Note (Signed)
Suspect pt's sob and crackles are due to fluid overload Also has pedal edema  Will dose lasix 20 mg daily for 3 d and report back watching weight carefully Pending rad review of cxr today as well

## 2016-09-28 NOTE — Assessment & Plan Note (Signed)
No symptoms of chest pressure

## 2016-09-28 NOTE — Assessment & Plan Note (Signed)
Chronic Good rate control Some s/s of fluid overload today  INR it tx

## 2016-09-28 NOTE — Assessment & Plan Note (Signed)
INR is therapeutic today No changes

## 2016-09-28 NOTE — Patient Instructions (Addendum)
I think you have a little cardiac fluid overload causing your chest congestion  Reflux may worsen cough as well  We will see what radiology says about your xray tomorrow  Given lasix 20 mg Wednesday and Thursday and Friday  (each with potassium)  Keep weighing and watching ankle swelling  Then call and update me with how she is doing on Friday

## 2016-09-28 NOTE — Assessment & Plan Note (Signed)
Worse lately-poss exacerbating cough  On protonix 40 mg  Note written to raise head of the bed 30 deg if possible  Also avoid going to bed right after eating  Will update

## 2016-09-28 NOTE — Patient Instructions (Signed)
Pre visit review using our clinic review tool, if applicable. No additional management support is needed unless otherwise documented below in the visit note. 

## 2016-09-28 NOTE — Assessment & Plan Note (Signed)
More fatigued  Shot today

## 2016-09-29 ENCOUNTER — Encounter: Payer: Self-pay | Admitting: Family Medicine

## 2016-09-29 ENCOUNTER — Telehealth: Payer: Self-pay | Admitting: Family Medicine

## 2016-09-29 MED ORDER — AZITHROMYCIN 250 MG PO TABS: ORAL_TABLET | ORAL | 0 refills | Status: DC

## 2016-09-29 NOTE — Telephone Encounter (Signed)
Re sent px for [facility]pak to [facility]  Family aware  Will make attempt to ger INR on fri or Monday if they can get out in the snow

## 2016-09-29 NOTE — Telephone Encounter (Signed)
zpack sent to [facility]

## 2016-10-04 ENCOUNTER — Ambulatory Visit (INDEPENDENT_AMBULATORY_CARE_PROVIDER_SITE_OTHER): Payer: PPO

## 2016-10-04 DIAGNOSIS — Z5181 Encounter for therapeutic drug level monitoring: Secondary | ICD-10-CM | POA: Diagnosis not present

## 2016-10-04 DIAGNOSIS — Z7901 Long term (current) use of anticoagulants: Secondary | ICD-10-CM

## 2016-10-04 DIAGNOSIS — I482 Chronic atrial fibrillation, unspecified: Secondary | ICD-10-CM

## 2016-10-04 LAB — POCT INR: INR: 3.3

## 2016-10-04 NOTE — Patient Instructions (Addendum)
Pre visit review using our clinic review tool, if applicable. No additional management support is needed unless otherwise documented below in the visit note.  INR today 3.3  Patient presents today following an acute respiratory infection and tx with a Zpack.  Abx therapy has been completed and patient presents with decreased SOB with improved weakness and fatigue.  She denies any uncontrollable bruising or bleeding but does present with significant pitting edema to BLE (4t on left) (2+on R).  In addition, there is significant bluish discoloration from just above ankle to toes, worse on the right.  Pedal and popliteal pulses present and equal bilaterally, with neg. Homens sign and denial of any pain or inflammation.  No sign of redness or warmth.  Did discuss with PCP, Dr. Glori Bickers to review symptoms, Dr. Glori Bickers would like for patient to make a follow up appointment with her this week for recent illness follow up and consideration need for a vascular specialist referral.  Appointment scheduled for this Friday, 10/08/16 at 2pm.     Patient to hold coumadin today and then resume prior dosing of 7.5mg  daily EXCEPT for 5mg  on Mondays and Fridays.  Recheck in 3 weeks.  Educated on risks associated with slightly supratherapeutic level and for patient to go to the ER if any concerning or uncontrollable bleeding or bruising develops.  Patient's daughter, [name], present and verbalizes understanding of all instructions given today.

## 2016-10-06 ENCOUNTER — Telehealth: Payer: Self-pay

## 2016-10-06 ENCOUNTER — Telehealth: Payer: Self-pay | Admitting: Cardiovascular Disease

## 2016-10-06 NOTE — Telephone Encounter (Signed)
Pt's daughter notified of Dr. Marliss Coots comments and verbalized understanding. Pt's daughter had a question of if pt should be taking the lisinopril she said pt has been off of med since Sept 2017, I checked with Dr. Glori Bickers and she said the lisinopril is good for her heart health but if it would drop her BP to low she needs to hold it and she recommended that they check in with cardiology about this medication since they are the specialist that advise her to hold med

## 2016-10-06 NOTE — Telephone Encounter (Signed)
Spoke w/ [name].  She reports that pt went to her PCP on 09/28/16 w/ swelling of her left leg.   She had crackles in her lungs, so she ordered a chest x-ray. Pt was found to have pneumonia, so she was put on abx which she finished this past Monday.  She was advised to take lasix 20 mg on Wed, Thurs & Fri last week, but there was no change in her wt. Sun 131.2 Mon 131.8 Tues 132.6 Today 133.4 - today she took lasix 20 mg. She reports that pt does not drink water, though she has glasses sitting around her.  Pt had a waffle and ham this am that she became choked on and had difficulty swallowing. She reports that her left leg has remained swollen - rt leg is not.  She was referred to a vein specialist in 2015, but Dr. Ron Parker advised against this.  She would like Dr. Donivan Scull recommendation on how to proceed. Advised her that I will make him aware and call her back w/ his recommendation.

## 2016-10-06 NOTE — Telephone Encounter (Signed)
Pt daughter states last Tues, pt had swelling in ankles and also a cough. Pt saws PCP. She ordered a chest xray. STates her PCP put her on an antibiotic, and Lasix for Wed -Friday. Daughter states nothing changed with pt weight. Pt daughter called PCP today and asked pt take a Lasix, and advised her to call Dr. Rockey Situ.  Pt c/o swelling: STAT is pt has developed SOB within 24 hours  1. How long have you been experiencing swelling? 1 week  2. Where is the swelling located? Left foot, and ankle   3.  Are you currently taking a "fluid pill"? yes  4.  Are you currently SOB? no  5.  Have you traveled recently? no

## 2016-10-06 NOTE — Telephone Encounter (Signed)
Daughter states that her mother has been gaining fluid her weight on Sunday was 130.2lb, Monday 131.8, Tuesday 132.6 and today 133.6. Daughter wants to know if she needs to give her mother a fluid pill? Please advise, thank you.

## 2016-10-06 NOTE — Telephone Encounter (Signed)
Could wear compression hose on the left leg Encouraging that right leg does not have swelling Would recommend  lab work including BMP, BNP to help guide her diuretic regiment Need to be cautious to avoid overdiuresis Weight has been trending up previously, felt it was more from fluid weight Would recommend follow-up in her clinic if symptoms do not improve We will call when lab work results are available

## 2016-10-06 NOTE — Telephone Encounter (Signed)
Give her one today and follow closely  Also - please make sure to let cardiology know as well  Let me know if she has any shortness of breath as well

## 2016-10-07 NOTE — Telephone Encounter (Signed)
Spoke with patients daughter and reviewed Dr. Donivan Scull recommendations in great detail and recommended lab work to be done. Patients weight today 134.2 and this was after lasix yesterday. She states that her mother has an appointment tomorrow with Dr. Glori Bickers and will have them draw the labs at that time. Instructed her to call back if symptoms do not improve and we could schedule her to come in and see [place] as well. Will route this message to Dr. Glori Bickers as well. She verbalized understanding of our conversation and had no further questions at this time.

## 2016-10-07 NOTE — Telephone Encounter (Signed)
No problem- I will do her labs tomorrow

## 2016-10-08 ENCOUNTER — Encounter: Payer: Self-pay | Admitting: Family Medicine

## 2016-10-08 ENCOUNTER — Ambulatory Visit (INDEPENDENT_AMBULATORY_CARE_PROVIDER_SITE_OTHER): Payer: PPO | Admitting: Family Medicine

## 2016-10-08 VITALS — BP 106/62 | HR 72 | Temp 97.6°F | Ht 67.0 in

## 2016-10-08 DIAGNOSIS — I872 Venous insufficiency (chronic) (peripheral): Secondary | ICD-10-CM

## 2016-10-08 DIAGNOSIS — J984 Other disorders of lung: Secondary | ICD-10-CM

## 2016-10-08 DIAGNOSIS — I10 Essential (primary) hypertension: Secondary | ICD-10-CM

## 2016-10-08 DIAGNOSIS — R600 Localized edema: Secondary | ICD-10-CM

## 2016-10-08 DIAGNOSIS — I8310 Varicose veins of unspecified lower extremity with inflammation: Secondary | ICD-10-CM | POA: Diagnosis not present

## 2016-10-08 DIAGNOSIS — N179 Acute kidney failure, unspecified: Secondary | ICD-10-CM

## 2016-10-08 DIAGNOSIS — I5043 Acute on chronic combined systolic (congestive) and diastolic (congestive) heart failure: Secondary | ICD-10-CM | POA: Diagnosis not present

## 2016-10-08 LAB — BASIC METABOLIC PANEL
BUN: 31 mg/dL — AB (ref 6–23)
CALCIUM: 9.5 mg/dL (ref 8.4–10.5)
CO2: 24 meq/L (ref 19–32)
Chloride: 111 mEq/L (ref 96–112)
Creatinine, Ser: 1.36 mg/dL — ABNORMAL HIGH (ref 0.40–1.20)
GFR: 38.56 mL/min — AB (ref >60.00)
GLUCOSE: 92 mg/dL (ref 70–99)
Potassium: 4.9 mEq/L (ref 3.5–5.1)
Sodium: 144 mEq/L (ref 135–145)

## 2016-10-08 LAB — BRAIN NATRIURETIC PEPTIDE: Pro B Natriuretic peptide (BNP): 868 pg/mL — ABNORMAL HIGH (ref 0.0–100.0)

## 2016-10-08 MED ORDER — WARFARIN SODIUM 5 MG PO TABS: ORAL_TABLET | ORAL | 1 refills | Status: DC

## 2016-10-08 MED ORDER — FUROSEMIDE 20 MG PO TABS: 20.0000 mg | ORAL_TABLET | Freq: Every day | ORAL | 1 refills | Status: DC | PRN

## 2016-10-08 NOTE — Progress Notes (Signed)
Pre visit review using our clinic review tool, if applicable. No additional management support is needed unless otherwise documented below in the visit note. 

## 2016-10-08 NOTE — Patient Instructions (Signed)
Labs today for cardiology BNP and BMP  Watch the sodium in your diet  Please do drink water as well to prevent dehydration  Stop at check out for referral for venous doppler of the legs

## 2016-10-08 NOTE — Assessment & Plan Note (Signed)
bilat LE to the knee with skin changes but no ulceration  Some blue discoloration of both feet-though they are warm and pedal pulses are normal  Some crackles on exam/ with hx of sob and wt gain -is taking lasix ? If CHF is acting up - BNP and BMP today  Will route to cardiology  Also ref for venous doppler of both legs to assess venous status  She is intolerant of hose or wrapping of legs Disc sodium avoidance and leg elevation

## 2016-10-08 NOTE — Progress Notes (Signed)
Subjective:    Patient ID: [name], female    DOB: [birth year], 83 y.o.   MRN: [id number]  HPI Here for visit and labs - multiple issues   Has had edema  Worse in L leg  Now all the way up to her thigh per pt (not noted on exam) Contacted cardiology-told to wear a stocking (but she does not tolerate compression at all)  They wanted to make sure she gets labs for her lasix as well as BNP which we will do today  The swollen areas do not hurt (though her knees do bother her) Some dark discoloration of skin/ feet  She cannot tolerate hose  She cannot tolerate a bandage   Pt states more sob lately exp when she lies down Some cough - sometimes yellow and other times white foam  She has felt cold but no fever   Dg Chest 2 View  Result Date: 09/29/2016 CLINICAL DATA:  Chest congestion EXAM: CHEST  2 VIEW COMPARISON:  04/02/2016 FINDINGS: Bibasilar opacities, favored to reflect atelectasis/scarring, although bilateral lower lobe pneumonia is not entirely excluded. Underlying chronic interstitial markings/emphysematous changes. Biapical pleural-parenchymal scarring. No pleural effusion or pneumothorax. Cardiomegaly. Exaggerated thoracic kyphosis. Lower thoracic vertebral augmentation. IMPRESSION: Bibasilar opacities, favored to reflect atelectasis/ scarring, although bilateral lower lobe pneumonia is not entirely excluded. Electronically Signed   By: Julian Hy M.D.   On: 09/29/2016 08:14   she took zithromax to empirically cover for CAP  No fever or malaise   Needs BNP and BMP today   Wt Readings from Last 3 Encounters:  08/26/16 128 lb (58.1 kg)  05/28/16 122 lb 2 oz (55.4 kg)  04/28/16 117 lb 2 oz (53.1 kg)  cardiology notes that wt is going up    Lab Results  Component Value Date   INR 3.3 10/04/2016   INR 2.4 09/28/2016   INR 2.3 09/03/2016    bp is stable today  No cp or palpitations or headaches or edema  No side effects to medicines  BP Readings from  Last 3 Encounters:  10/08/16 106/62  09/28/16 106/64  08/26/16 118/70     Patient Active Problem List   Diagnosis Date Noted  . Pedal edema 10/08/2016  . Chest congestion 09/28/2016  . Encounter for therapeutic drug monitoring 08/24/2016  . Long-term (current) use of anticoagulants 08/24/2016  . Acute on chronic systolic congestive heart failure ([place])   . Acute blood loss anemia 03/24/2016  . Surgery, elective   . Arterial hypotension   . Lumbar stenosis with neurogenic claudication 03/23/2016  . Adjustment disorder with mixed anxiety and depressed mood 06/09/2015  . Generalized weakness 05/12/2015  . Chronic systolic CHF (congestive heart failure) ([place]) 04/29/2015  . Cardiomyopathy, ischemic   . Acute systolic CHF (congestive heart failure) ([place])   . Pulmonary hypertension   . Adult failure to thrive   . Chronic atrial fibrillation ([place])   . Unstable angina ([place]) 04/07/2015  . Right sided sciatica 10/25/2014  . Venous stasis dermatitis 06/19/2014  . Varicose veins of lower extremities with inflammation 03/25/2014  . Chronic diastolic CHF (congestive heart failure) ([place]) 01/04/2014  . Orthostatic hypotension   . Acute on chronic combined systolic and diastolic heart failure ([place]) 03/02/2013  . Acute renal failure ([place]) 02/28/2013  . Shingles 02/28/2013  . History of shingles 02/28/2013  . Nail fungus 01/10/2013  . Hyperlipidemia 07/09/2011  . Vision blurred 07/09/2011  . Hearing loss 07/09/2011  . Weight loss   .  A-fib ([place])   . Tachycardia   . CAD (coronary artery disease)   . Mitral regurgitation   . SOB (shortness of breath)   . LBBB (left bundle branch block)   . Warfarin anticoagulation   . Ejection fraction   . TR (tricuspid regurgitation)   . Dyslipidemia   . Lung abnormality   . Prolonged QT interval   . Coccydynia 12/03/2010  . Nonspecific (abnormal) findings on radiological and other examination of body structure 07/13/2010  . ABNORMAL CHEST XRAY  07/13/2010  . Shortness of breath 06/18/2010  . Essential hypertension 06/24/2009  . GOUT, UNSPECIFIED 03/20/2009  . GERD 03/18/2009  . Thrombocytopenia ([place]) 01/03/2009  . ALLERGIC RHINITIS 12/05/2008  . HOARSENESS 06/07/2008  . MEDIASTINAL LYMPHADENOPATHY 05/13/2008  . Other diseases of lung, not elsewhere classified 04/18/2008  . Venous (peripheral) insufficiency 09/13/2007  . FREQUENCY, URINARY 07/19/2007  . VERTIGO, BENIGN PAROXYSMAL POSITION 05/11/2007  . TINNITUS NOS 05/11/2007  . OSTEOPOROSIS NOS 05/11/2007  . HYPOTHYROIDISM 04/11/2007  . ANEMIA, B12 DEFICIENCY 04/11/2007  . HYPOXEMIA 04/11/2007   Past Medical History:  Diagnosis Date  . Allergy   . Anemia   . Arthritis    osteo  . CAD (coronary artery disease)    a. 2 DES, [facility], 2004; b. nuc 2013 septal dyssenergy 2/2 LBBB, nl LV fxn;  c. 06/2015 MV: EF 41%, septal HK (LBBB), no ischemia->Low risk.  . Chronic atrial fibrillation (HCC)    a. on Coumadin (CHA2DS2VASc = 6); b. Holter 06/2010, no brady, mild tachy.  . Compression fracture of lumbar vertebra ([place]) 05-2008  . COPD (chronic obstructive pulmonary disease) ([place])   . Diverticulosis of colon   . Dyslipidemia   . Essential hypertension   . Family history of adverse reaction to anesthesia    daughter PONV  . GERD (gastroesophageal reflux disease)   . Hard of hearing   . Hematemesis    a. 03/2015.  [name] Hx of colonoscopy   . Hyperlipidemia   . Hypertension   . Hypothyroid   . Ischemic cardiomyopathy    a. 03/2015 Echo: EF 30-35%, ant, antsept HK, mod MR, mildly dil LA/RV, mod dil RA, mod-sev TR, PASP 38mmHg.  [name] LBBB (left bundle branch block)   . Lung abnormality    Dr [name] 122/2011 no further w/u needed  . Mitral regurgitation   . Myocardial infarction 2016   "mild heart attack"  . Nausea    Some nausea with medications, May, 2013,  . Nocturia   . OA (osteoarthritis)   . Orthostatic hypotension    June, 2014  . Osteoporosis   . Pleural effusion  associated with pulmonary infection 03-2007  . Prolonged QT interval    when on sotolol in past  . Pulmonary hypertension    a. 03/2015 PASP 92mmHg by echo.  . Shingles 2014  . Thrombocytopenia ([place])   . TR (tricuspid regurgitation)    moderate, echo 2008  . Urinary frequency   . Vitamin B deficiency   . Warfarin anticoagulation   . Wears dentures   . Wears glasses   . Weight loss    August, 2012   Past Surgical History:  Procedure Laterality Date  . COLONOSCOPY    . CORONARY ANGIOPLASTY  2004  . LUMBAR LAMINECTOMY/DECOMPRESSION MICRODISCECTOMY N/A 03/23/2016   Procedure: Lumbar three-four Laminectomy/Foraminotomy, posterior laterial arthrodesis;  Surgeon: Leeroy Cha, MD;  Location: [facility] NEURO ORS;  Service: Neurosurgery;  Laterality: N/A;  L3-4 Laminectomy/Foraminotomy  . THYROIDECTOMY  1962  . TONSILLECTOMY  Social History  Substance Use Topics  . Smoking status: Former Smoker    Quit date: 09/14/1947  . Smokeless tobacco: Never Used  . Alcohol use No   Family History  Problem Relation Age of Onset  . Heart disease Mother 9  . Stroke Father 87  . Heart disease Sister   . Cancer Brother     colon  . Diabetes Brother   . Leukemia Sister    Allergies  Allergen Reactions  . Sulfonamide Derivatives Nausea And Vomiting    REACTION: sick  . Alendronate Sodium Other (See Comments)    REACTION: GI   Current Outpatient Prescriptions on File Prior to Visit  Medication Sig Dispense Refill  . calcium carbonate (OS-CAL) 600 MG TABS tablet Take 1,200 mg by mouth every evening.     . cyanocobalamin (,VITAMIN B-12,) 1000 MCG/ML injection Inject 1,000 mcg into the muscle once.    . fluticasone (FLONASE) 50 MCG/ACT nasal spray 2 SPRAYS IN BOTH NOSTRILS DAILY 16 g 5  . Glucosamine HCl 500 MG TABS Take 500 mg by mouth daily.     [name] guaifenesin (HUMIBID E) 400 MG TABS tablet Take 400 mg by mouth every 4 (four) hours.    [name] levothyroxine (SYNTHROID, LEVOTHROID) 88 MCG tablet TAKE 1  TABLET BY MOUTH DAILY 90 tablet 0  . Lutein 6 MG CAPS Take 1 capsule by mouth daily.     . metoprolol tartrate (LOPRESSOR) 25 MG tablet Take 0.5 tablets (12.5 mg total) by mouth 2 (two) times daily. (Patient taking differently: Take 25 mg by mouth 2 (two) times daily. ) 180 tablet 3  . midodrine (PROAMATINE) 5 MG tablet TAKE ONE TABLET BY MOUTH TWICE DAILY WITH A MEAL 180 tablet 3  . mirtazapine (REMERON) 15 MG tablet Take 1 tablet (15 mg total) by mouth at bedtime. 90 tablet 3  . Multiple Vitamin (MULTIVITAMIN) tablet Take 1 tablet by mouth daily.     . pantoprazole (PROTONIX) 40 MG tablet TAKE 1 TABLET BY MOUTH DAILY 90 tablet 1  . senna-docusate (SENOKOT-S) 8.6-50 MG per tablet Take 1 tablet by mouth at bedtime.    . simvastatin (ZOCOR) 20 MG tablet TAKE 1/2 TABLET  (10MG   TOTAL) EVERY EVENING (Patient taking differently: Take 5 mg by mouth every evening. ) 45 tablet 1   No current facility-administered medications on file prior to visit.      Review of Systems    Review of Systems  Constitutional: Negative for fever, appetite change,  Pos for fatigue and wt gain  Eyes: Negative for pain and visual disturbance.  Respiratory: Negative for cough and pos for shortness of breath.  (when lying down) Cardiovascular: Negative for cp or palpitations   pos for pedal edema worse on L and also varicose veins Gastrointestinal: Negative for nausea, diarrhea and constipation.  Genitourinary: Negative for urgency and frequency.  Skin: Negative for pallor or rash  pos for some skin flaking on legs  Neurological: Negative for weakness, light-headedness, numbness and headaches.  Hematological: Negative for adenopathy. Does not bruise/bleed easily.  Psychiatric/Behavioral: Negative for dysphoric mood. The patient is at times  nervous/anxious.      Objective:   Physical Exam  Constitutional: She appears well-developed and well-nourished. No distress.  Frail appearing elderly female in wheelchair who  can ambulate with assistance  HENT:  Head: Normocephalic and atraumatic.  Mouth/Throat: Oropharynx is clear and moist.  Eyes: Conjunctivae and EOM are normal. Pupils are equal, round, and reactive to light. Right eye  exhibits no discharge. Left eye exhibits no discharge. No scleral icterus.  Neck: Normal range of motion. Neck supple. No JVD present. Carotid bruit is not present. No thyromegaly present.  Cardiovascular: Normal rate and intact distal pulses.  Exam reveals no gallop.   Murmur heard. Intact pedal pulses and feet are warm but dark in color  Varicose veins (large compressible up to the thighs) and also spider veins- diffusely esp at ankles   Pulmonary/Chest: Effort normal and breath sounds normal. No respiratory distress. She has no wheezes. She has no rales.  Crackles heard at bilateral bases No rales or rhonchi No wheeze/ good air exch No prolonged exp phase  Not sob at rest   Abdominal: Soft. Bowel sounds are normal. She exhibits no distension, no abdominal bruit and no mass. There is no tenderness.  Musculoskeletal: She exhibits edema.  2 plus edema bilat LE - slt more notable on the L with some discoloration of skin of both feet (dark)- again moreso on the left  Pedal pulses are normal  Varicosities diffusely which are compressible or spider type No palp cord/erythema/warmth  Neg homans sign  Lymphadenopathy:    She has no cervical adenopathy.  Neurological: She is alert. She has normal reflexes.  Skin: Skin is warm and dry. No rash noted.  Psychiatric: She has a normal mood and affect.          Assessment & Plan:   Problem List Items Addressed This Visit      Cardiovascular and Mediastinum   Acute on chronic combined systolic and diastolic heart failure ([place]) - Primary    Unsure whether today's leg edema is coming from heart failure or venous insufficiency  She has good pedal pulses but some blue discoloration of feet (worse on L) - though toes are not  cold She does c/o of some sob lying down and also wt gain  Lasix does not seem to be helping  She does not tolerate hose or any kind of compression wrap  Cardiology aware  Will check bmp for lasix and BNP for heart failure today and route to Dr Rockey Situ  If worse sob - alert us/ go to ED if necessary  Elevate feet whenever possible  Venous doppler ordered to assess her vein status       Relevant Medications   furosemide (LASIX) 20 MG tablet   warfarin (COUMADIN) 5 MG tablet   Other Relevant Orders   Basic metabolic panel (Completed)   Brain natriuretic peptide (Completed)   Essential hypertension    bp in fair control at this time  BP Readings from Last 1 Encounters:  10/08/16 106/62   No changes needed Disc lifstyle change with low sodium diet and exercise  This remains stable off lisinopril that pt stopped for orthostatic hypotension      Relevant Medications   furosemide (LASIX) 20 MG tablet   warfarin (COUMADIN) 5 MG tablet   Other Relevant Orders   Basic metabolic panel (Completed)   Brain natriuretic peptide (Completed)   Varicose veins of lower extremities with inflammation    No skin breakdown but significant swelling and minimal changes of dermatitis Venous doppler of LEs ordered       Relevant Medications   furosemide (LASIX) 20 MG tablet   warfarin (COUMADIN) 5 MG tablet   Other Relevant Orders   VAS [place] LOWER EXTREMITY VENOUS (DVT)   Basic metabolic panel (Completed)   Brain natriuretic peptide (Completed)   Venous (peripheral) insufficiency (Chronic)  Unsure if this is the cause of her edema  Venous LE doppler ordered      Relevant Medications   furosemide (LASIX) 20 MG tablet   warfarin (COUMADIN) 5 MG tablet   Other Relevant Orders   VAS [place] LOWER EXTREMITY VENOUS (DVT)   Basic metabolic panel (Completed)   Brain natriuretic peptide (Completed)     Respiratory   Lung abnormality    Empirically tx for CAP after last xray - she is coughing less           Genitourinary   Acute renal failure (HCC)    Bmp pending today         Other   Pedal edema    bilat LE to the knee with skin changes but no ulceration  Some blue discoloration of both feet-though they are warm and pedal pulses are normal  Some crackles on exam/ with hx of sob and wt gain -is taking lasix ? If CHF is acting up - BNP and BMP today  Will route to cardiology  Also ref for venous doppler of both legs to assess venous status  She is intolerant of hose or wrapping of legs Disc sodium avoidance and leg elevation       Relevant Orders   VAS [place] LOWER EXTREMITY VENOUS (DVT)   Basic metabolic panel (Completed)   Brain natriuretic peptide (Completed)

## 2016-10-10 ENCOUNTER — Emergency Department: Payer: PPO

## 2016-10-10 ENCOUNTER — Inpatient Hospital Stay
Admission: EM | Admit: 2016-10-10 | Discharge: 2016-10-14 | DRG: 291 | Disposition: A | Discharge status: Home / Self Care | Payer: PPO | Attending: Internal Medicine | Admitting: Internal Medicine

## 2016-10-10 DIAGNOSIS — R627 Adult failure to thrive: Secondary | ICD-10-CM | POA: Diagnosis present

## 2016-10-10 DIAGNOSIS — Z87891 Personal history of nicotine dependence: Secondary | ICD-10-CM

## 2016-10-10 DIAGNOSIS — I130 Hypertensive heart and chronic kidney disease with heart failure and stage 1 through stage 4 chronic kidney disease, or unspecified chronic kidney disease: Principal | ICD-10-CM | POA: Diagnosis present

## 2016-10-10 DIAGNOSIS — R0602 Shortness of breath: Secondary | ICD-10-CM | POA: Diagnosis not present

## 2016-10-10 DIAGNOSIS — I447 Left bundle-branch block, unspecified: Secondary | ICD-10-CM | POA: Diagnosis not present

## 2016-10-10 DIAGNOSIS — J09X2 Influenza due to identified novel influenza A virus with other respiratory manifestations: Secondary | ICD-10-CM | POA: Diagnosis not present

## 2016-10-10 DIAGNOSIS — N179 Acute kidney failure, unspecified: Secondary | ICD-10-CM | POA: Diagnosis present

## 2016-10-10 DIAGNOSIS — I2510 Atherosclerotic heart disease of native coronary artery without angina pectoris: Secondary | ICD-10-CM | POA: Diagnosis not present

## 2016-10-10 DIAGNOSIS — Z888 Allergy status to other drugs, medicaments and biological substances status: Secondary | ICD-10-CM

## 2016-10-10 DIAGNOSIS — Z66 Do not resuscitate: Secondary | ICD-10-CM | POA: Diagnosis present

## 2016-10-10 DIAGNOSIS — J111 Influenza due to unidentified influenza virus with other respiratory manifestations: Secondary | ICD-10-CM | POA: Diagnosis not present

## 2016-10-10 DIAGNOSIS — J90 Pleural effusion, not elsewhere classified: Secondary | ICD-10-CM | POA: Diagnosis not present

## 2016-10-10 DIAGNOSIS — E876 Hypokalemia: Secondary | ICD-10-CM | POA: Diagnosis present

## 2016-10-10 DIAGNOSIS — Z8249 Family history of ischemic heart disease and other diseases of the circulatory system: Secondary | ICD-10-CM

## 2016-10-10 DIAGNOSIS — Z823 Family history of stroke: Secondary | ICD-10-CM

## 2016-10-10 DIAGNOSIS — I255 Ischemic cardiomyopathy: Secondary | ICD-10-CM | POA: Diagnosis not present

## 2016-10-10 DIAGNOSIS — J101 Influenza due to other identified influenza virus with other respiratory manifestations: Secondary | ICD-10-CM | POA: Diagnosis not present

## 2016-10-10 DIAGNOSIS — E039 Hypothyroidism, unspecified: Secondary | ICD-10-CM | POA: Diagnosis not present

## 2016-10-10 DIAGNOSIS — E785 Hyperlipidemia, unspecified: Secondary | ICD-10-CM | POA: Diagnosis present

## 2016-10-10 DIAGNOSIS — N183 Chronic kidney disease, stage 3 (moderate): Secondary | ICD-10-CM | POA: Diagnosis present

## 2016-10-10 DIAGNOSIS — Z882 Allergy status to sulfonamides status: Secondary | ICD-10-CM

## 2016-10-10 DIAGNOSIS — H9190 Unspecified hearing loss, unspecified ear: Secondary | ICD-10-CM | POA: Diagnosis present

## 2016-10-10 DIAGNOSIS — M1990 Unspecified osteoarthritis, unspecified site: Secondary | ICD-10-CM | POA: Diagnosis present

## 2016-10-10 DIAGNOSIS — K219 Gastro-esophageal reflux disease without esophagitis: Secondary | ICD-10-CM | POA: Diagnosis present

## 2016-10-10 DIAGNOSIS — Z7901 Long term (current) use of anticoagulants: Secondary | ICD-10-CM

## 2016-10-10 DIAGNOSIS — I252 Old myocardial infarction: Secondary | ICD-10-CM

## 2016-10-10 DIAGNOSIS — Z79899 Other long term (current) drug therapy: Secondary | ICD-10-CM

## 2016-10-10 DIAGNOSIS — Z955 Presence of coronary angioplasty implant and graft: Secondary | ICD-10-CM

## 2016-10-10 DIAGNOSIS — I5023 Acute on chronic systolic (congestive) heart failure: Secondary | ICD-10-CM | POA: Diagnosis not present

## 2016-10-10 DIAGNOSIS — R748 Abnormal levels of other serum enzymes: Secondary | ICD-10-CM | POA: Diagnosis present

## 2016-10-10 DIAGNOSIS — J9601 Acute respiratory failure with hypoxia: Secondary | ICD-10-CM | POA: Diagnosis present

## 2016-10-10 DIAGNOSIS — M6281 Muscle weakness (generalized): Secondary | ICD-10-CM | POA: Diagnosis not present

## 2016-10-10 DIAGNOSIS — I482 Chronic atrial fibrillation: Secondary | ICD-10-CM | POA: Diagnosis not present

## 2016-10-10 DIAGNOSIS — D696 Thrombocytopenia, unspecified: Secondary | ICD-10-CM | POA: Diagnosis not present

## 2016-10-10 DIAGNOSIS — F329 Major depressive disorder, single episode, unspecified: Secondary | ICD-10-CM | POA: Diagnosis not present

## 2016-10-10 DIAGNOSIS — R05 Cough: Secondary | ICD-10-CM | POA: Diagnosis not present

## 2016-10-10 DIAGNOSIS — R2681 Unsteadiness on feet: Secondary | ICD-10-CM

## 2016-10-10 DIAGNOSIS — I509 Heart failure, unspecified: Secondary | ICD-10-CM | POA: Diagnosis not present

## 2016-10-10 DIAGNOSIS — D649 Anemia, unspecified: Secondary | ICD-10-CM | POA: Diagnosis present

## 2016-10-10 DIAGNOSIS — I4891 Unspecified atrial fibrillation: Secondary | ICD-10-CM | POA: Diagnosis not present

## 2016-10-10 DIAGNOSIS — E539 Vitamin B deficiency, unspecified: Secondary | ICD-10-CM | POA: Diagnosis present

## 2016-10-10 DIAGNOSIS — M810 Age-related osteoporosis without current pathological fracture: Secondary | ICD-10-CM | POA: Diagnosis present

## 2016-10-10 DIAGNOSIS — R0600 Dyspnea, unspecified: Secondary | ICD-10-CM | POA: Diagnosis not present

## 2016-10-10 DIAGNOSIS — J449 Chronic obstructive pulmonary disease, unspecified: Secondary | ICD-10-CM | POA: Diagnosis present

## 2016-10-10 DIAGNOSIS — Z833 Family history of diabetes mellitus: Secondary | ICD-10-CM

## 2016-10-10 DIAGNOSIS — I110 Hypertensive heart disease with heart failure: Secondary | ICD-10-CM | POA: Diagnosis not present

## 2016-10-10 HISTORY — DX: Chronic systolic (congestive) heart failure: I50.22

## 2016-10-10 LAB — CBC
HCT: 36.6 % (ref 35.0–47.0)
Hemoglobin: 12.6 g/dL (ref 12.0–16.0)
MCH: 32.5 pg (ref 26.0–34.0)
MCHC: 34.3 g/dL (ref 32.0–36.0)
MCV: 94.8 fL (ref 80.0–100.0)
PLATELETS: 93 10*3/uL — AB (ref 150–440)
RBC: 3.86 MIL/uL (ref 3.80–5.20)
RDW: 16.5 % — ABNORMAL HIGH (ref 11.5–14.5)
WBC: 6.4 10*3/uL (ref 3.6–11.0)

## 2016-10-10 LAB — TROPONIN I
TROPONIN I: 0.03 ng/mL — AB (ref <0.03)
TROPONIN I: 0.03 ng/mL — AB (ref <0.03)
Troponin I: <0.03 ng/mL (ref <0.03)
Troponin I: <0.03 ng/mL (ref <0.03)

## 2016-10-10 LAB — COMPREHENSIVE METABOLIC PANEL
ALT: 14 U/L (ref 14–54)
ANION GAP: 8 (ref 5–15)
AST: 33 U/L (ref 15–41)
Albumin: 4 g/dL (ref 3.5–5.0)
Alkaline Phosphatase: 59 U/L (ref 38–126)
BUN: 32 mg/dL — ABNORMAL HIGH (ref 6–20)
CALCIUM: 9.4 mg/dL (ref 8.9–10.3)
CHLORIDE: 110 mmol/L (ref 101–111)
CO2: 24 mmol/L (ref 22–32)
CREATININE: 1.27 mg/dL — AB (ref 0.44–1.00)
GFR calc Af Amer: 41 mL/min — ABNORMAL LOW (ref >60)
GFR, EST NON AFRICAN AMERICAN: 35 mL/min — AB (ref >60)
Glucose, Bld: 118 mg/dL — ABNORMAL HIGH (ref 65–99)
Potassium: 4.2 mmol/L (ref 3.5–5.1)
Sodium: 142 mmol/L (ref 135–145)
Total Bilirubin: 1.6 mg/dL — ABNORMAL HIGH (ref 0.3–1.2)
Total Protein: 6.5 g/dL (ref 6.5–8.1)

## 2016-10-10 LAB — PROTIME-INR
INR: 2.71
PROTHROMBIN TIME: 29.3 s — AB (ref 11.4–15.2)

## 2016-10-10 LAB — INFLUENZA PANEL BY PCR (TYPE A & B)
Influenza A By PCR: POSITIVE — AB
Influenza B By PCR: NEGATIVE

## 2016-10-10 LAB — BRAIN NATRIURETIC PEPTIDE: B NATRIURETIC PEPTIDE 5: 846 pg/mL — AB (ref 0.0–100.0)

## 2016-10-10 MED ORDER — SODIUM CHLORIDE 0.9% FLUSH
3.0000 mL | Freq: Two times a day (BID) | INTRAVENOUS | Status: DC
  Administered 2016-10-10 – 2016-10-14 (×8): 3 mL via INTRAVENOUS

## 2016-10-10 MED ORDER — LEVOTHYROXINE SODIUM 88 MCG PO TABS
88.0000 ug | ORAL_TABLET | Freq: Every day | ORAL | Status: DC
  Administered 2016-10-11 – 2016-10-14 (×4): 88 ug via ORAL
  Filled 2016-10-10 (×4): qty 1

## 2016-10-10 MED ORDER — SIMVASTATIN 5 MG PO TABS
5.0000 mg | ORAL_TABLET | Freq: Every evening | ORAL | Status: DC
  Administered 2016-10-10 – 2016-10-13 (×4): 5 mg via ORAL
  Filled 2016-10-10 (×4): qty 1

## 2016-10-10 MED ORDER — OSELTAMIVIR PHOSPHATE 30 MG PO CAPS: 30.0000 mg | ORAL_CAPSULE | Freq: Two times a day (BID) | ORAL | Status: DC

## 2016-10-10 MED ORDER — WARFARIN SODIUM 5 MG PO TABS: 7.5000 mg | ORAL_TABLET | Freq: Every day | ORAL | Status: DC

## 2016-10-10 MED ORDER — DOCUSATE SODIUM 100 MG PO CAPS
100.0000 mg | ORAL_CAPSULE | Freq: Every day | ORAL | Status: DC
  Administered 2016-10-10 – 2016-10-14 (×5): 100 mg via ORAL
  Filled 2016-10-10 (×5): qty 1

## 2016-10-10 MED ORDER — ACETAMINOPHEN 650 MG RE SUPP: 650.0000 mg | Freq: Four times a day (QID) | RECTAL | Status: DC | PRN

## 2016-10-10 MED ORDER — METOPROLOL TARTRATE 25 MG PO TABS
12.5000 mg | ORAL_TABLET | Freq: Two times a day (BID) | ORAL | Status: DC
  Administered 2016-10-10 – 2016-10-14 (×7): 12.5 mg via ORAL
  Filled 2016-10-10 (×8): qty 1

## 2016-10-10 MED ORDER — OCUVITE-LUTEIN PO CAPS
1.0000 | ORAL_CAPSULE | Freq: Every day | ORAL | Status: DC
  Administered 2016-10-11 – 2016-10-14 (×4): 1 via ORAL
  Filled 2016-10-10 (×4): qty 1

## 2016-10-10 MED ORDER — CALCIUM CARBONATE 1250 (500 CA) MG PO TABS
1200.0000 mg | ORAL_TABLET | Freq: Every evening | ORAL | Status: DC
  Filled 2016-10-10: qty 1

## 2016-10-10 MED ORDER — MIDODRINE HCL 5 MG PO TABS
5.0000 mg | ORAL_TABLET | Freq: Two times a day (BID) | ORAL | Status: DC
  Administered 2016-10-10 – 2016-10-14 (×8): 5 mg via ORAL
  Filled 2016-10-10 (×8): qty 1

## 2016-10-10 MED ORDER — GLUCOSAMINE HCL 500 MG PO TABS: 500.0000 mg | ORAL_TABLET | Freq: Every day | ORAL | Status: DC

## 2016-10-10 MED ORDER — WARFARIN SODIUM 3 MG PO TABS
4.0000 mg | ORAL_TABLET | Freq: Once | ORAL | Status: AC
  Administered 2016-10-10: 18:00:00 4 mg via ORAL
  Filled 2016-10-10: qty 1

## 2016-10-10 MED ORDER — MIRTAZAPINE 15 MG PO TABS
15.0000 mg | ORAL_TABLET | Freq: Every day | ORAL | Status: DC
  Administered 2016-10-10 – 2016-10-13 (×4): 15 mg via ORAL
  Filled 2016-10-10 (×4): qty 1

## 2016-10-10 MED ORDER — ADULT MULTIVITAMIN W/MINERALS CH
1.0000 | ORAL_TABLET | Freq: Every day | ORAL | Status: DC
  Administered 2016-10-11 – 2016-10-14 (×4): 1 via ORAL
  Filled 2016-10-10 (×5): qty 1

## 2016-10-10 MED ORDER — ONDANSETRON HCL 4 MG/2ML IJ SOLN: 4.0000 mg | Freq: Four times a day (QID) | INTRAMUSCULAR | Status: DC | PRN

## 2016-10-10 MED ORDER — FUROSEMIDE 10 MG/ML IJ SOLN
40.0000 mg | Freq: Once | INTRAMUSCULAR | Status: AC
  Administered 2016-10-10: 40 mg via INTRAVENOUS
  Filled 2016-10-10: qty 4

## 2016-10-10 MED ORDER — GUAIFENESIN 100 MG/5ML PO SOLN
5.0000 mL | ORAL | Status: DC | PRN
  Administered 2016-10-10: 100 mg via ORAL
  Filled 2016-10-10 (×2): qty 5

## 2016-10-10 MED ORDER — CALCIUM CARBONATE ANTACID 500 MG PO CHEW
3.0000 | CHEWABLE_TABLET | Freq: Every day | ORAL | Status: DC
  Administered 2016-10-10 – 2016-10-14 (×4): 600 mg via ORAL
  Filled 2016-10-10 (×6): qty 3

## 2016-10-10 MED ORDER — FUROSEMIDE 10 MG/ML IJ SOLN
20.0000 mg | Freq: Two times a day (BID) | INTRAMUSCULAR | Status: DC
  Administered 2016-10-11 – 2016-10-13 (×5): 20 mg via INTRAVENOUS
  Filled 2016-10-10 (×5): qty 2

## 2016-10-10 MED ORDER — OSELTAMIVIR PHOSPHATE 30 MG PO CAPS
30.0000 mg | ORAL_CAPSULE | Freq: Two times a day (BID) | ORAL | Status: DC
  Administered 2016-10-11 – 2016-10-12 (×4): 30 mg via ORAL
  Filled 2016-10-10 (×4): qty 1

## 2016-10-10 MED ORDER — WARFARIN SODIUM 5 MG PO TABS: 5.0000 mg | ORAL_TABLET | Freq: Every day | ORAL | Status: DC

## 2016-10-10 MED ORDER — ONDANSETRON HCL 4 MG PO TABS: 4.0000 mg | ORAL_TABLET | Freq: Four times a day (QID) | ORAL | Status: DC | PRN

## 2016-10-10 MED ORDER — OSELTAMIVIR PHOSPHATE 75 MG PO CAPS
75.0000 mg | ORAL_CAPSULE | Freq: Once | ORAL | Status: AC
  Administered 2016-10-10: 75 mg via ORAL
  Filled 2016-10-10: qty 1

## 2016-10-10 MED ORDER — ACETAMINOPHEN 325 MG PO TABS: 650.0000 mg | ORAL_TABLET | Freq: Four times a day (QID) | ORAL | Status: DC | PRN

## 2016-10-10 MED ORDER — WARFARIN - PHARMACIST DOSING INPATIENT
Freq: Every day | Status: DC
  Administered 2016-10-11 – 2016-10-13 (×3)

## 2016-10-10 NOTE — Assessment & Plan Note (Addendum)
Bmp pending today

## 2016-10-10 NOTE — Assessment & Plan Note (Signed)
Empirically tx for CAP after last xray - she is coughing less

## 2016-10-10 NOTE — ED Notes (Signed)
Patient transported to X-ray 

## 2016-10-10 NOTE — Assessment & Plan Note (Signed)
Unsure whether today's leg edema is coming from heart failure or venous insufficiency  She has good pedal pulses but some blue discoloration of feet (worse on L) - though toes are not cold She does c/o of some sob lying down and also wt gain  Lasix does not seem to be helping  She does not tolerate hose or any kind of compression wrap  Cardiology aware  Will check bmp for lasix and BNP for heart failure today and route to Dr Rockey Situ  If worse sob - alert us/ go to ED if necessary  Elevate feet whenever possible  Venous doppler ordered to assess her vein status

## 2016-10-10 NOTE — Assessment & Plan Note (Signed)
Unsure if this is the cause of her edema  Venous LE doppler ordered

## 2016-10-10 NOTE — Progress Notes (Signed)
PHARMACIST - PHYSICIAN ORDER COMMUNICATION  CONCERNING: P&T Medication Policy on Herbal Medications  DESCRIPTION:  This patient's order for:  GLUCOSAMINE  has been noted.  This product(s) is classified as an "herbal" or natural product. Due to a lack of definitive safety studies or FDA approval, nonstandard manufacturing practices, plus the potential risk of unknown drug-drug interactions while on inpatient medications, the Pharmacy and Therapeutics Committee does not permit the use of "herbal" or natural products of this type within [facility].   ACTION TAKEN: The pharmacy department is unable to verify this order at this time and your patient has been informed of this safety policy. Please reevaluate patient's clinical condition at discharge and address if the herbal or natural product(s) should be resumed at that time.  Pernell Dupre, PharmD, BCPS Clinical Pharmacist 10/10/2016 5:11 PM

## 2016-10-10 NOTE — Assessment & Plan Note (Signed)
No skin breakdown but significant swelling and minimal changes of dermatitis Venous doppler of LEs ordered

## 2016-10-10 NOTE — ED Notes (Signed)
Elevated troponin, MD made aware. 

## 2016-10-10 NOTE — ED Provider Notes (Signed)
[facility] Emergency Department Provider Note  Time seen: 11:09 AM  I have reviewed the triage vital signs and the nursing notes.   HISTORY  Chief Complaint Shortness of Breath    HPI [name] is a 83 y.o. female with a past medical history of CHF, atrial fibrillation, hypertension, hyperlipidemia, presents to the emergency department with shortness breath. According to the patient since yesterday she has been feeling more short of breath, she is coughing with production of white sputum. Patient lives at a nursing facility. Denies any fever or headache. Denies body aches. Patient noted to have a room air saturation in the mid 90s. Patient denies any chest pain. Denies any leg pain.  Past Medical History:  Diagnosis Date  . Allergy   . Anemia   . Arthritis    osteo  . CAD (coronary artery disease)    a. 2 DES, [facility], 2004; b. nuc 2013 septal dyssenergy 2/2 LBBB, nl LV fxn;  c. 06/2015 MV: EF 41%, septal HK (LBBB), no ischemia->Low risk.  . Chronic atrial fibrillation (HCC)    a. on Coumadin (CHA2DS2VASc = 6); b. Holter 06/2010, no brady, mild tachy.  . Compression fracture of lumbar vertebra ([place]) 05-2008  . COPD (chronic obstructive pulmonary disease) ([place])   . Diverticulosis of colon   . Dyslipidemia   . Essential hypertension   . Family history of adverse reaction to anesthesia    daughter PONV  . GERD (gastroesophageal reflux disease)   . Hard of hearing   . Hematemesis    a. 03/2015.  [name] Hx of colonoscopy   . Hyperlipidemia   . Hypertension   . Hypothyroid   . Ischemic cardiomyopathy    a. 03/2015 Echo: EF 30-35%, ant, antsept HK, mod MR, mildly dil LA/RV, mod dil RA, mod-sev TR, PASP 82mmHg.  [name] LBBB (left bundle branch block)   . Lung abnormality    Dr [name] 122/2011 no further w/u needed  . Mitral regurgitation   . Myocardial infarction 2016   "mild heart attack"  . Nausea    Some nausea with medications, May, 2013,  . Nocturia    . OA (osteoarthritis)   . Orthostatic hypotension    June, 2014  . Osteoporosis   . Pleural effusion associated with pulmonary infection 03-2007  . Prolonged QT interval    when on sotolol in past  . Pulmonary hypertension    a. 03/2015 PASP 44mmHg by echo.  . Shingles 2014  . Thrombocytopenia ([place])   . TR (tricuspid regurgitation)    moderate, echo 2008  . Urinary frequency   . Vitamin B deficiency   . Warfarin anticoagulation   . Wears dentures   . Wears glasses   . Weight loss    August, 2012    Patient Active Problem List   Diagnosis Date Noted  . Pedal edema 10/08/2016  . Chest congestion 09/28/2016  . Encounter for therapeutic drug monitoring 08/24/2016  . Long-term (current) use of anticoagulants 08/24/2016  . Acute on chronic systolic congestive heart failure ([place])   . Acute blood loss anemia 03/24/2016  . Surgery, elective   . Arterial hypotension   . Lumbar stenosis with neurogenic claudication 03/23/2016  . Adjustment disorder with mixed anxiety and depressed mood 06/09/2015  . Generalized weakness 05/12/2015  . Chronic systolic CHF (congestive heart failure) ([place]) 04/29/2015  . Cardiomyopathy, ischemic   . Acute systolic CHF (congestive heart failure) ([place])   . Pulmonary hypertension   . Adult  failure to thrive   . Chronic atrial fibrillation ([place])   . Unstable angina ([place]) 04/07/2015  . Right sided sciatica 10/25/2014  . Venous stasis dermatitis 06/19/2014  . Varicose veins of lower extremities with inflammation 03/25/2014  . Chronic diastolic CHF (congestive heart failure) ([place]) 01/04/2014  . Orthostatic hypotension   . Acute on chronic combined systolic and diastolic heart failure ([place]) 03/02/2013  . Acute renal failure ([place]) 02/28/2013  . Shingles 02/28/2013  . History of shingles 02/28/2013  . Nail fungus 01/10/2013  . Hyperlipidemia 07/09/2011  . Vision blurred 07/09/2011  . Hearing loss 07/09/2011  . Weight loss   . A-fib ([place])   .  Tachycardia   . CAD (coronary artery disease)   . Mitral regurgitation   . SOB (shortness of breath)   . LBBB (left bundle branch block)   . Warfarin anticoagulation   . Ejection fraction   . TR (tricuspid regurgitation)   . Dyslipidemia   . Lung abnormality   . Prolonged QT interval   . Coccydynia 12/03/2010  . Nonspecific (abnormal) findings on radiological and other examination of body structure 07/13/2010  . ABNORMAL CHEST XRAY 07/13/2010  . Shortness of breath 06/18/2010  . Essential hypertension 06/24/2009  . GOUT, UNSPECIFIED 03/20/2009  . GERD 03/18/2009  . Thrombocytopenia ([place]) 01/03/2009  . ALLERGIC RHINITIS 12/05/2008  . HOARSENESS 06/07/2008  . MEDIASTINAL LYMPHADENOPATHY 05/13/2008  . Other diseases of lung, not elsewhere classified 04/18/2008  . Venous (peripheral) insufficiency 09/13/2007  . FREQUENCY, URINARY 07/19/2007  . VERTIGO, BENIGN PAROXYSMAL POSITION 05/11/2007  . TINNITUS NOS 05/11/2007  . OSTEOPOROSIS NOS 05/11/2007  . HYPOTHYROIDISM 04/11/2007  . ANEMIA, B12 DEFICIENCY 04/11/2007  . HYPOXEMIA 04/11/2007    Past Surgical History:  Procedure Laterality Date  . COLONOSCOPY    . CORONARY ANGIOPLASTY  2004  . LUMBAR LAMINECTOMY/DECOMPRESSION MICRODISCECTOMY N/A 03/23/2016   Procedure: Lumbar three-four Laminectomy/Foraminotomy, posterior laterial arthrodesis;  Surgeon: Leeroy Cha, MD;  [facility];  Service: Neurosurgery;  Laterality: N/A;  L3-4 Laminectomy/Foraminotomy  . THYROIDECTOMY  1962  . TONSILLECTOMY      Prior to Admission medications   Medication Sig Start Date End Date Taking? Authorizing Provider  calcium carbonate (OS-CAL) 600 MG TABS tablet Take 1,200 mg by mouth every evening.     Historical Provider, MD  cyanocobalamin (,VITAMIN B-12,) 1000 MCG/ML injection Inject 1,000 mcg into the muscle once.    Historical Provider, MD  fluticasone (FLONASE) 50 MCG/ACT nasal spray 2 SPRAYS IN BOTH NOSTRILS DAILY 05/28/16   Abner Greenspan, MD  furosemide (LASIX) 20 MG tablet Take 1 tablet (20 mg total) by mouth daily as needed. 10/08/16   Abner Greenspan, MD  Glucosamine HCl 500 MG TABS Take 500 mg by mouth daily.     Historical Provider, MD  guaifenesin (HUMIBID E) 400 MG TABS tablet Take 400 mg by mouth every 4 (four) hours.    Historical Provider, MD  levothyroxine (SYNTHROID, LEVOTHROID) 88 MCG tablet TAKE 1 TABLET BY MOUTH DAILY 07/27/16   Abner Greenspan, MD  Lutein 6 MG CAPS Take 1 capsule by mouth daily.     Historical Provider, MD  metoprolol tartrate (LOPRESSOR) 25 MG tablet Take 0.5 tablets (12.5 mg total) by mouth 2 (two) times daily. Patient taking differently: Take 25 mg by mouth 2 (two) times daily.  05/11/16   Minna Merritts, MD  midodrine (PROAMATINE) 5 MG tablet TAKE ONE TABLET BY MOUTH TWICE DAILY WITH A MEAL 01/16/16  Minna Merritts, MD  mirtazapine (REMERON) 15 MG tablet Take 1 tablet (15 mg total) by mouth at bedtime. 10/24/15   Abner Greenspan, MD  Multiple Vitamin (MULTIVITAMIN) tablet Take 1 tablet by mouth daily.     Historical Provider, MD  pantoprazole (PROTONIX) 40 MG tablet TAKE 1 TABLET BY MOUTH DAILY 06/03/16   Abner Greenspan, MD  senna-docusate (SENOKOT-S) 8.6-50 MG per tablet Take 1 tablet by mouth at bedtime.    Historical Provider, MD  simvastatin (ZOCOR) 20 MG tablet TAKE 1/2 TABLET  (10MG   TOTAL) EVERY EVENING Patient taking differently: Take 5 mg by mouth every evening.  10/24/15   Abner Greenspan, MD  warfarin (COUMADIN) 5 MG tablet TAKE AS DIRECTED BY ANTI-COAGULATION CLINIC 10/08/16   Abner Greenspan, MD    Allergies  Allergen Reactions  . Sulfonamide Derivatives Nausea And Vomiting    REACTION: sick  . Alendronate Sodium Other (See Comments)    REACTION: GI    Family History  Problem Relation Age of Onset  . Heart disease Mother 20  . Stroke Father 44  . Heart disease Sister   . Cancer Brother     colon  . Diabetes Brother   . Leukemia Sister     Social History Social History   Substance Use Topics  . Smoking status: Former Smoker    Quit date: 09/14/1947  . Smokeless tobacco: Never Used  . Alcohol use No    Review of Systems Constitutional: Negative for fever Cardiovascular: Negative for chest pain. Respiratory: Positive for shortness of breath. Gastrointestinal: Negative for abdominal pain Neurological: Negative for headache 10-point ROS otherwise negative.  ____________________________________________   PHYSICAL EXAM:  Constitutional: Alert and oriented. Well appearing and in no distress. Eyes: Normal exam ENT   Head: Normocephalic and atraumatic.   Mouth/Throat: Mucous membranes are moist. Cardiovascular: Normal rate, regular rhythm. No murmur Respiratory: Mild tachypnea. Patient has lower lobe rales on exam. Gastrointestinal: Soft and nontender. No distention.  Musculoskeletal: Nontender with normal range of motion in all extremities.  Neurologic:  Normal speech and language. No gross focal neurologic deficits Skin:  Skin is warm, dry and intact.  Psychiatric: Mood and affect are normal.   ____________________________________________    EKG  EKG reviewed and interpreted by myself shows atrial fibrillation at 77 bpm, widened QRS, normal axis, patient has nonspecific ST changes, no ST elevation. Most consistent with left bundle branch block.  ____________________________________________    RADIOLOGY  Chest x-ray shows cardiomegaly with vascular congestion.  ____________________________________________   INITIAL IMPRESSION / ASSESSMENT AND PLAN / ED COURSE  Pertinent labs & imaging results that were available during my care of the patient were reviewed by me and considered in my medical decision making (see chart for details).  Patient presents the emergency department increased shortness of breath and white sputum production. Patient appears to rales on exam, with good O2 saturation. Nontender abdomen. No significant lower  extremity edema. We will check labs, chest x-ray, close monitoring in the emergency department while awaiting lab results.  BNP is elevated, chest x-ray shows cardiomegaly with vascular congestion. Influenza A is positive. Given the patient's generalized weakness fatigue rales on exam with vascular congestion on x-ray we'll admit to the hospital to continue with Tamiflu and IV diuresis.  ____________________________________________   FINAL CLINICAL IMPRESSION(S) / ED DIAGNOSES  Dyspnea CHF exacerbation Influenza   Harvest Dark, MD 10/10/16 1255

## 2016-10-10 NOTE — Progress Notes (Signed)
ANTICOAGULATION CONSULT NOTE - Initial Consult  Pharmacy Consult for Wafarin dosing  Indication: atrial fibrillation  Allergies  Allergen Reactions  . Sulfonamide Derivatives Nausea And Vomiting    REACTION: sick  . Alendronate Sodium Other (See Comments)    REACTION: GI    Patient Measurements: Height: 5\' 7"  (170.2 cm) Weight: 135 lb 12.8 oz (61.6 kg) IBW/kg (Calculated) : 61.6   Recent Labs  10/08/16 1458 10/10/16 1115 10/10/16 1155 10/10/16 1457  HGB  --  12.6  --   --   HCT  --  36.6  --   --   PLT  --  93*  --   --   LABPROT  --   --  29.3*  --   INR  --   --  2.71  --   CREATININE 1.36* 1.27*  --   --   TROPONINI  --  0.03*  --  <0.03    Estimated Creatinine Clearance: 26.9 mL/min (by C-G formula based on SCr of 1.27 mg/dL (H)).   Assessment: 83 yo female admitted with acute respiratory failure and influenza. Patient on warfarin therapy at home for chronic A. Fib. Pharmacy consulted for warfarin dosing and monitoring while inpatient.   Home Regimen: Warfarin 7.5mg  :Mon, Tue, Weds, Thurs, Sat                             Warfarin 5mg : Sun, Fri   DATE  INR  DOSE 1/28  2.71  4mg     Goal of Therapy:  INR 2-3 Monitor platelets by anticoagulation protocol: Yes   Plan:  Patients INR was therapeutic on admission. Patient was started on Tamiflu (oseltamivir) which can increase risk of bleeding in patients taking warfarin. Will give reduced dose of warfarin 4mg  tonight and check INR with AM labs.  INRs will be ordered daily while patient is on antibiotics.   Pernell Dupre, PharmD, BCPS Clinical Pharmacist 10/10/2016 5:31 PM

## 2016-10-10 NOTE — ED Triage Notes (Signed)
Pt to ED from [facility] via [facility] c/o SOB. Pt presents with SOB, cough, and CHF complications. Pt states she "has been gaining 1lb every day" and having "difficulty breathing". Pt alert and oriented sating at 95% RA, no acute distress noted.

## 2016-10-10 NOTE — Assessment & Plan Note (Signed)
bp in fair control at this time  BP Readings from Last 1 Encounters:  10/08/16 106/62   No changes needed Disc lifstyle change with low sodium diet and exercise  This remains stable off lisinopril that pt stopped for orthostatic hypotension

## 2016-10-10 NOTE — Progress Notes (Signed)
Patient admitted to unit from ED to the unit for acute respiratory failure, weakness and flu, Patient t alert and oriented, denies any pain, VSS, patient oriented to the room and call light, bed alarm activated patient instructed to call for assistance

## 2016-10-10 NOTE — H&P (Signed)
[facility] [place] NAME: [name]    MR#:  [id number]  DATE OF BIRTH:  01-May-1922  DATE OF ADMISSION:  10/10/2016  PRIMARY CARE PHYSICIAN: Loura Pardon, MD   REQUESTING/REFERRING PHYSICIAN: Dr. Harvest Dark  CHIEF COMPLAINT:   Chief Complaint  Patient presents with  . Shortness of Breath    HISTORY OF PRESENT ILLNESS:  [name]  is a 83 y.o. female with a known history of Coronary artery disease status post stent placement, chronic atrial fibrillation, COPD, osteoarthritis, osteoporosis, hypothyroidism, who presents to the hospital due to shortness of breath, cough, weakness. Most of the history obtained from the daughter at bedside. As per the daughter patient has been having worsening lower extremity swelling and a cough ongoing for the past 2 weeks. Patient was seen by primary care physician given Lasix for 3 days and also placed on antibiotic. Despite being on antibiotic and Lasix patient's clinical symptoms have not improved. Today in the morning patient started having significant dyspnea even on minimal exertion and also a cough which is productive with clear sputum. She was brought to the ER for further evaluation. Patient was noted to be in acute respiratory failure with hypoxia secondary to CHF and also noted to be positive for influenza A. Hospitalist services were contacted further treatment and evaluation.  PAST MEDICAL HISTORY:   Past Medical History:  Diagnosis Date  . Allergy   . Anemia   . Arthritis    osteo  . CAD (coronary artery disease)    a. 2 DES, [facility], 2004; b. nuc 2013 septal dyssenergy 2/2 LBBB, nl LV fxn;  c. 06/2015 MV: EF 41%, septal HK (LBBB), no ischemia->Low risk.  . Chronic atrial fibrillation (HCC)    a. on Coumadin (CHA2DS2VASc = 6); b. Holter 06/2010, no brady, mild tachy.  . Compression fracture of lumbar vertebra ([place]) 05-2008  . COPD (chronic obstructive pulmonary disease) ([place])   .  Diverticulosis of colon   . Dyslipidemia   . Essential hypertension   . Family history of adverse reaction to anesthesia    daughter PONV  . GERD (gastroesophageal reflux disease)   . Hard of hearing   . Hematemesis    a. 03/2015.  [name] Hx of colonoscopy   . Hyperlipidemia   . Hypertension   . Hypothyroid   . Ischemic cardiomyopathy    a. 03/2015 Echo: EF 30-35%, ant, antsept HK, mod MR, mildly dil LA/RV, mod dil RA, mod-sev TR, PASP 33mmHg.  [name] LBBB (left bundle branch block)   . Lung abnormality    Dr [name] 122/2011 no further w/u needed  . Mitral regurgitation   . Myocardial infarction 2016   "mild heart attack"  . Nausea    Some nausea with medications, May, 2013,  . Nocturia   . OA (osteoarthritis)   . Orthostatic hypotension    June, 2014  . Osteoporosis   . Pleural effusion associated with pulmonary infection 03-2007  . Prolonged QT interval    when on sotolol in past  . Pulmonary hypertension    a. 03/2015 PASP 34mmHg by echo.  . Shingles 2014  . Thrombocytopenia ([place])   . TR (tricuspid regurgitation)    moderate, echo 2008  . Urinary frequency   . Vitamin B deficiency   . Warfarin anticoagulation   . Wears dentures   . Wears glasses   . Weight loss    August, 2012    PAST SURGICAL HISTORY:  Past Surgical History:  Procedure Laterality Date  . COLONOSCOPY    . CORONARY ANGIOPLASTY  2004  . LUMBAR LAMINECTOMY/DECOMPRESSION MICRODISCECTOMY N/A 03/23/2016   Procedure: Lumbar three-four Laminectomy/Foraminotomy, posterior laterial arthrodesis;  Surgeon: Leeroy Cha, MD;  Location: [facility] NEURO ORS;  Service: Neurosurgery;  Laterality: N/A;  L3-4 Laminectomy/Foraminotomy  . THYROIDECTOMY  1962  . TONSILLECTOMY      SOCIAL HISTORY:   Social History  Substance Use Topics  . Smoking status: Former Smoker    Types: Cigarettes    Quit date: 09/14/1947  . Smokeless tobacco: Never Used  . Alcohol use No    FAMILY HISTORY:   Family History  Problem Relation  Age of Onset  . Heart disease Mother 10  . Stroke Father 26  . Heart disease Sister   . Cancer Brother     colon  . Diabetes Brother   . Leukemia Sister     DRUG ALLERGIES:   Allergies  Allergen Reactions  . Sulfonamide Derivatives Nausea And Vomiting    REACTION: sick  . Alendronate Sodium Other (See Comments)    REACTION: GI    REVIEW OF SYSTEMS:   Review of Systems  Constitutional: Negative for fever and weight loss.  HENT: Negative for congestion, nosebleeds and tinnitus.   Eyes: Negative for blurred vision, double vision and redness.  Respiratory: Positive for cough and shortness of breath. Negative for hemoptysis.   Cardiovascular: Positive for orthopnea and leg swelling. Negative for chest pain and PND.  Gastrointestinal: Negative for abdominal pain, diarrhea, melena, nausea and vomiting.  Genitourinary: Negative for dysuria, hematuria and urgency.  Musculoskeletal: Negative for falls and joint pain.  Neurological: Negative for dizziness, tingling, sensory change, focal weakness, seizures, weakness and headaches.  Endo/Heme/Allergies: Negative for polydipsia. Does not bruise/bleed easily.  Psychiatric/Behavioral: Negative for depression and memory loss. The patient is not nervous/anxious.     MEDICATIONS AT HOME:   Prior to Admission medications   Medication Sig Start Date End Date Taking? Authorizing Provider  calcium carbonate (OS-CAL) 600 MG TABS tablet Take 1,200 mg by mouth every evening.    Yes Historical Provider, MD  docusate sodium (COLACE) 100 MG capsule Take 100 mg by mouth daily.   Yes Historical Provider, MD  furosemide (LASIX) 20 MG tablet Take 1 tablet (20 mg total) by mouth daily as needed. 10/08/16  Yes Abner Greenspan, MD  Glucosamine HCl 500 MG TABS Take 500 mg by mouth daily.    Yes Historical Provider, MD  hypromellose (GENTEAL) 0.3 % GEL ophthalmic ointment Place into both eyes.   Yes Historical Provider, MD  levothyroxine (SYNTHROID, LEVOTHROID)  88 MCG tablet TAKE 1 TABLET BY MOUTH DAILY 07/27/16  Yes Abner Greenspan, MD  metoprolol tartrate (LOPRESSOR) 25 MG tablet Take 0.5 tablets (12.5 mg total) by mouth 2 (two) times daily. Patient taking differently: Take 12.5 mg by mouth 2 (two) times daily. HOLD evening dose if BP is under 110 05/11/16  Yes Minna Merritts, MD  midodrine (PROAMATINE) 5 MG tablet TAKE ONE TABLET BY MOUTH TWICE DAILY WITH A MEAL 01/16/16  Yes Minna Merritts, MD  mirtazapine (REMERON) 15 MG tablet Take 1 tablet (15 mg total) by mouth at bedtime. 10/24/15  Yes Abner Greenspan, MD  Multiple Vitamin (MULTIVITAMIN) tablet Take 1 tablet by mouth daily.    Yes Historical Provider, MD  multivitamin-lutein (OCUVITE-LUTEIN) CAPS capsule Take 1 capsule by mouth daily.   Yes Historical Provider, MD  pantoprazole (PROTONIX) 40 MG tablet TAKE  1 TABLET BY MOUTH DAILY 06/03/16  Yes Abner Greenspan, MD  simvastatin (ZOCOR) 20 MG tablet TAKE 1/2 TABLET  (10MG   TOTAL) EVERY EVENING Patient taking differently: Take 5 mg by mouth every evening.  10/24/15  Yes Abner Greenspan, MD  warfarin (COUMADIN) 5 MG tablet Take 5 mg by mouth daily. Sunday and friday   Yes Historical Provider, MD  warfarin (COUMADIN) 7.5 MG tablet Take 7.5 mg by mouth daily. Monday, Tuesday, Wednesday, Thursday, Saturday   Yes Historical Provider, MD  fluticasone (FLONASE) 50 MCG/ACT nasal spray 2 SPRAYS IN BOTH NOSTRILS DAILY Patient not taking: Reported on 10/10/2016 05/28/16   Abner Greenspan, MD  warfarin (COUMADIN) 5 MG tablet TAKE AS DIRECTED BY ANTI-COAGULATION CLINIC Patient not taking: Reported on 10/10/2016 10/08/16   Abner Greenspan, MD      VITAL SIGNS:  Blood pressure 131/79, pulse 85, temperature 97.6 F (36.4 C), temperature source Oral, resp. rate 14, height 5\' 7"  (1.702 m), weight 61.6 kg (135 lb 12.8 oz), SpO2 93 %.  PHYSICAL EXAMINATION:  Physical Exam  GENERAL:  83 y.o.-year-old patient lying in the bed in mild resp. distress.  EYES: Pupils equal, round,  reactive to light and accommodation. No scleral icterus. Extraocular muscles intact.  HEENT: Head atraumatic, normocephalic. Oropharynx and nasopharynx clear. No oropharyngeal erythema, moist oral mucosa  NECK:  Supple, + jugular venous distention. No thyroid enlargement, no tenderness.  LUNGS: Normal breath sounds bilaterally, no wheezing, bibasilar rales, No rhonchi. No use of accessory muscles of respiration.  CARDIOVASCULAR: S1, S2 Irregular. No murmurs, rubs, gallops, clicks.  ABDOMEN: Soft, nontender, nondistended. Bowel sounds present. No organomegaly or mass.  EXTREMITIES: No pedal edema, cyanosis, or clubbing. + 2 pedal & radial pulses b/l.   NEUROLOGIC: Cranial nerves II through XII are intact. No focal Motor or sensory deficits appreciated b/l. Globally weak.  PSYCHIATRIC: The patient is alert and oriented x 3.  SKIN: No obvious rash, lesion, or ulcer.   LABORATORY PANEL:   CBC  Recent Labs Lab 10/10/16 1115  WBC 6.4  HGB 12.6  HCT 36.6  PLT 93*   ------------------------------------------------------------------------------------------------------------------  Chemistries   Recent Labs Lab 10/10/16 1115  NA 142  K 4.2  CL 110  CO2 24  GLUCOSE 118*  BUN 32*  CREATININE 1.27*  CALCIUM 9.4  AST 33  ALT 14  ALKPHOS 59  BILITOT 1.6*   ------------------------------------------------------------------------------------------------------------------  Cardiac Enzymes  Recent Labs Lab 10/10/16 1115  TROPONINI 0.03*   ------------------------------------------------------------------------------------------------------------------  RADIOLOGY:  Dg Chest 2 View  Result Date: 10/10/2016 CLINICAL DATA:  Shortness of breath, cough EXAM: CHEST  2 VIEW COMPARISON:  09/28/2016 FINDINGS: Cardiomegaly with vascular congestion. Bibasilar opacities, likely atelectasis. Small effusions. No overt edema. IMPRESSION: Cardiomegaly with vascular congestion. Bibasilar  opacities, likely atelectasis with small effusions. Electronically Signed   By: Rolm Baptise M.D.   On: 10/10/2016 11:43     IMPRESSION AND PLAN:   83 year old female with past medical history of atrial fibrillation, cardiomyopathy with ejection fraction of [id number], chronic systolic CHF, osteoporosis, osteoarthritis, central hypertension who presented to the hospital due to weakness, cough and shortness of breath.  1. Acute respiratory failure with hypoxia-secondary to CHF and also flu. -Continue O2 supplementation, we'll diurese with IV Lasix for CHF, place on Tamiflu and supportive care. -Follow clinically.  2. CHF-acute on chronic systolic dysfunction. Patient has had about a 5-10 pound weight gain over the past month. -I will diuresis with IV Lasix, follow I's and O's  and daily weights. -Continue metoprolol.  We will get a Cardiology consult.   3. Flu - pt. Is + for inluenza A by PCR.  - Droplet precautions. Place on Tamiflu  4. Hx of chronic a. Fib - rate controlled. Cont. Metoprolol.  - cont. Coumadin.  INR therapeutic.   5. Hyperlipidemia - cont. Zocor.   6. Hypothyroidism - cont. Synthroid.    7. Depression - cont. Remeron.   All the records are reviewed and case discussed with ED provider. Management plans discussed with the patient, family and they are in agreement.  CODE STATUS: DNR  TOTAL TIME TAKING CARE OF THIS PATIENT: 45 minutes.    [name] M.D on 10/10/2016 at 2:56 PM  Between 7am to 6pm - Pager - [phone]  After 6pm go to [email or web address] - password [email or web address] [facility]  [place] [facility]  Office  [phone]  CC: Primary care physician; Loura Pardon, MD

## 2016-10-11 ENCOUNTER — Encounter: Payer: Self-pay | Admitting: Nurse Practitioner

## 2016-10-11 DIAGNOSIS — I482 Chronic atrial fibrillation: Secondary | ICD-10-CM

## 2016-10-11 DIAGNOSIS — I5023 Acute on chronic systolic (congestive) heart failure: Secondary | ICD-10-CM

## 2016-10-11 LAB — BASIC METABOLIC PANEL
ANION GAP: 9 (ref 5–15)
BUN: 29 mg/dL — ABNORMAL HIGH (ref 6–20)
CALCIUM: 8.8 mg/dL — AB (ref 8.9–10.3)
CO2: 23 mmol/L (ref 22–32)
Chloride: 110 mmol/L (ref 101–111)
Creatinine, Ser: 1.24 mg/dL — ABNORMAL HIGH (ref 0.44–1.00)
GFR, EST AFRICAN AMERICAN: 42 mL/min — AB (ref >60)
GFR, EST NON AFRICAN AMERICAN: 36 mL/min — AB (ref >60)
Glucose, Bld: 104 mg/dL — ABNORMAL HIGH (ref 65–99)
POTASSIUM: 3.8 mmol/L (ref 3.5–5.1)
SODIUM: 142 mmol/L (ref 135–145)

## 2016-10-11 LAB — CBC
HCT: 34.8 % — ABNORMAL LOW (ref 35.0–47.0)
HEMOGLOBIN: 11.8 g/dL — AB (ref 12.0–16.0)
MCH: 31.8 pg (ref 26.0–34.0)
MCHC: 34 g/dL (ref 32.0–36.0)
MCV: 93.4 fL (ref 80.0–100.0)
Platelets: 89 10*3/uL — ABNORMAL LOW (ref 150–440)
RBC: 3.73 MIL/uL — AB (ref 3.80–5.20)
RDW: 16 % — ABNORMAL HIGH (ref 11.5–14.5)
WBC: 6.5 10*3/uL (ref 3.6–11.0)

## 2016-10-11 LAB — PROTIME-INR
INR: 2.72
PROTHROMBIN TIME: 29.4 s — AB (ref 11.4–15.2)

## 2016-10-11 MED ORDER — BENZONATATE 100 MG PO CAPS
100.0000 mg | ORAL_CAPSULE | Freq: Three times a day (TID) | ORAL | Status: DC
  Administered 2016-10-11 – 2016-10-14 (×9): 100 mg via ORAL
  Filled 2016-10-11 (×9): qty 1

## 2016-10-11 MED ORDER — WARFARIN SODIUM 4 MG PO TABS
4.0000 mg | ORAL_TABLET | Freq: Once | ORAL | Status: AC
  Administered 2016-10-11: 4 mg via ORAL
  Filled 2016-10-11: qty 1

## 2016-10-11 NOTE — Progress Notes (Signed)
Troponin level came slight positive 0.03. Patient denied any acute chest pain. No respiratory distress noted. Dr. Jannifer Franklin notified with no new order. Will continue to monitor.

## 2016-10-11 NOTE — Progress Notes (Signed)
[facility] at [place] NAME: Nylea Writer    MR#:  [id number]  DATE OF BIRTH:  1922/04/11  SUBJECTIVE:  Doing well today. Endorses cough with thick clear sputum and continued weakness and lethargy. Breathing is much better today and only feels SOB with coughing spells. Lower extremity edema much improved with IV Lasix.   REVIEW OF SYSTEMS:   Review of Systems  Constitutional: Negative for chills and fever.  HENT: Negative for congestion, hearing loss and tinnitus.   Eyes: Negative for blurred vision, double vision and photophobia.  Respiratory: Positive for cough and sputum production. Negative for hemoptysis and shortness of breath.   Cardiovascular: Negative for chest pain, palpitations and orthopnea.  Gastrointestinal: Negative for heartburn, nausea and vomiting.  Genitourinary: Negative for dysuria, frequency and urgency.  Musculoskeletal: Negative for myalgias and neck pain.  Skin: Negative for itching and rash.  Neurological: Positive for weakness. Negative for dizziness, tingling and headaches.  Endo/Heme/Allergies: Negative for environmental allergies. Does not bruise/bleed easily.  Psychiatric/Behavioral: Negative for depression, substance abuse and suicidal ideas.    DRUG ALLERGIES:   Allergies  Allergen Reactions  . Sulfonamide Derivatives Nausea And Vomiting    REACTION: sick  . Alendronate Sodium Other (See Comments)    REACTION: GI    VITALS:  Blood pressure (!) 93/48, pulse 83, temperature 98.2 F (36.8 C), temperature source Oral, resp. rate 20, height 5\' 7"  (1.702 m), weight 61.6 kg (135 lb 12.8 oz), SpO2 93 %.  PHYSICAL EXAMINATION:   Physical Exam  GENERAL:  83 y.o.-year-old patient lying in the bed with no acute distress.  EYES: Pupils equal, round, reactive to light and accommodation. No scleral icterus. Extraocular muscles intact.  HEENT: Head atraumatic, normocephalic. Oropharynx and nasopharynx  clear.  NECK:  Supple, no jugular venous distention. No thyroid enlargement, no tenderness.  LUNGS: Normal breath sounds bilaterally, no wheezing, rales, rhonchi. No use of accessory muscles of respiration.  CARDIOVASCULAR: S1, S2 normal. No murmurs, rubs, or gallops.  ABDOMEN: Soft, nontender, nondistended. Bowel sounds present. No organomegaly or mass.  EXTREMITIES: No cyanosis, clubbing. Mild lower extremity edema, bilaterally. NEUROLOGIC: Cranial nerves II through XII are intact. No focal Motor or sensory deficits b/l.   PSYCHIATRIC:  patient is alert and oriented x 3.  SKIN: No obvious rash, lesion, or ulcer.   LABORATORY PANEL:  CBC  Recent Labs Lab 10/11/16 0528  WBC 6.5  HGB 11.8*  HCT 34.8*  PLT 89*    Chemistries   Recent Labs Lab 10/10/16 1115 10/11/16 0528  NA 142 142  K 4.2 3.8  CL 110 110  CO2 24 23  GLUCOSE 118* 104*  BUN 32* 29*  CREATININE 1.27* 1.24*  CALCIUM 9.4 8.8*  AST 33  --   ALT 14  --   ALKPHOS 59  --   BILITOT 1.6*  --    Cardiac Enzymes  Recent Labs Lab 10/10/16 2251  TROPONINI 0.03*   RADIOLOGY:  Dg Chest 2 View  Result Date: 10/10/2016 CLINICAL DATA:  Shortness of breath, cough EXAM: CHEST  2 VIEW COMPARISON:  09/28/2016 FINDINGS: Cardiomegaly with vascular congestion. Bibasilar opacities, likely atelectasis. Small effusions. No overt edema. IMPRESSION: Cardiomegaly with vascular congestion. Bibasilar opacities, likely atelectasis with small effusions. Electronically Signed   By: Rolm Baptise M.D.   On: 10/10/2016 11:43   ASSESSMENT AND PLAN:  83yo female with PMH afib, cardiomyopathy with EF [id number], chronic systolic CHF, osteoporosis, osteoarthritis, central HTN and COPD  who presented with weakness, cough and SOB. Admitted for acute respiratory failure with hypoxia - secondary to CHF exacerbation and positive for Influenza A. Stable on room air, IV Lasix, Tamiflu and supportive treatment.   1) Acute respiratory failure with hypoxia -  secondary to CHF and flu - stable on room air, continue to monitor O2 stat  2) CHF - acute on chronic systolic dysfunction  - Continue diuresis with IV Lasix and follow I/O and daily weights  - Continue Metoprolol - Appreciate consult from cardiology  3) Influenza A - Continue Tamiflu & Droplet precautions  4) Chronic afib - rate controlled  - continue Metoprolol & Coumadin   5) Hyperlipidemia - continue Zocor  6) Hypothyroidism - continue Synthroid   7) Depression - continue Remeron   Case discussed with Care Management/Social Worker. Management plans discussed with the patient, family and they are in agreement.  CODE STATUS: DNR   TOTAL TIME TAKING CARE OF THIS PATIENT: 30 minutes.  >50% time spent on counselling and coordination of care    Eloise Levels,  Physician Assistant Student    This is a Ship broker note, solely for education purposes. This note was reviewed with and approved by attending preceptor.  CC: Primary care physician; Loura Pardon, MD

## 2016-10-11 NOTE — Consult Note (Signed)
Cardiology Consult    Patient ID: [name] MRN: [id number], DOB/AGE: 12-Jan-1922   Admit date: 10/10/2016 Date of Consult: 10/11/2016  Primary Physician: Loura Pardon, MD Primary Cardiologist: Johnny Bridge, MD  Requesting Provider: Claria Dice, MD  Patient Profile    83 y/o ? with a h/o CAD, ICM, Chronic systolic CHF, HTN, HL, chronic AF on coumadin, and PAH, who was admitted 1/28 with Flu and CHF.  Past Medical History   Past Medical History:  Diagnosis Date  . Allergy   . Anemia   . Arthritis    osteo  . CAD (coronary artery disease)    a. 2 DES, [facility], 2004; b. nuc 2013 septal dyssenergy 2/2 LBBB, nl LV fxn;  c. 06/2015 MV: EF 41%, septal HK (LBBB), no ischemia->Low risk.  . Chronic atrial fibrillation (HCC)    a. on Coumadin (CHA2DS2VASc = 6); b. Holter 06/2010, no brady, mild tachy.  . Chronic systolic CHF (congestive heart failure) ([place])    a. 03/2015 Echo: EF 30-35%;  b. 01/2016 Echo: EF 30-35%.  . Compression fracture of lumbar vertebra ([place]) 05-2008  . COPD (chronic obstructive pulmonary disease) ([place])   . Diverticulosis of colon   . Dyslipidemia   . Essential hypertension   . Family history of adverse reaction to anesthesia    daughter PONV  . GERD (gastroesophageal reflux disease)   . Hard of hearing   . Hematemesis    a. 03/2015.  [name] Hx of colonoscopy   . Hypothyroid   . Ischemic cardiomyopathy    a. 03/2015 Echo: EF 30-35%, ant, antsept HK, mod MR, mildly dil LA/RV, mod dil RA, mod-sev TR, PASP 13mmHg;  b. 01/2016 Echo: EF 30-35%, ant/antsept HK, mildly dil Ao root (3.4cm) and Asc Ao (3.5cm), mild [email or web address] bi-atrial enlargement, mild to mod TR. PASP 86mmHg.  [name] LBBB (left bundle branch block)   . Lung abnormality    Dr [name] 122/2011 no further w/u needed  . Mitral regurgitation   . Myocardial infarction 2016   "mild heart attack"  . Nausea    Some nausea with medications, May, 2013,  . Nocturia   . OA (osteoarthritis)   . Orthostatic hypotension    June, 2014  . Osteoporosis   . Pleural effusion associated with pulmonary infection 03-2007  . Prolonged QT interval    when on sotolol in past  . Pulmonary hypertension    a. 03/2015 PASP 27mmHg by echo.  . Shingles 2014  . Thrombocytopenia ([place])   . TR (tricuspid regurgitation)    moderate, echo 2008  . Urinary frequency   . Vitamin B deficiency   . Warfarin anticoagulation   . Wears dentures   . Wears glasses   . Weight loss    August, 2012    Past Surgical History:  Procedure Laterality Date  . COLONOSCOPY    . CORONARY ANGIOPLASTY  2004  . LUMBAR LAMINECTOMY/DECOMPRESSION MICRODISCECTOMY N/A 03/23/2016   Procedure: Lumbar three-four Laminectomy/Foraminotomy, posterior laterial arthrodesis;  Surgeon: Leeroy Cha, MD;  Location: [facility] NEURO ORS;  Service: Neurosurgery;  Laterality: N/A;  L3-4 Laminectomy/Foraminotomy  . THYROIDECTOMY  1962  . TONSILLECTOMY       Allergies  Allergies  Allergen Reactions  . Sulfonamide Derivatives Nausea And Vomiting    REACTION: sick  . Alendronate Sodium Other (See Comments)    REACTION: GI    History of Present Illness    83 y/o ? with the above complex PMH including CAD s/p prior MI and DES  x 2 @ [facility] in 2004.  F/u MV in 06/2015 showed no ischemia.  She also has a h/o ICM/Chronic systolic CHF with an EF of 30-35%, HTN, HL, PAH, and chronic afib on coumadin.  She lives in a skilled nsg facility.  She was in her usual state of health until about 3 wks ago when she began to have worsening lower ext swelling and dyspnea.  CXR showed pna and PCP placed her abx and also a short course of lasix.  Unfortunately, she did not improve.  On 1/28, she developed cough and worsening dyspnea.  She was taken to ED where CXR showed CHF.  She also tested + for flu.  She was admitted and placed on tamiflu and IV lasix.  She has had good output so far.  She remains weak and does not participate much in the interview.  Her dtr is @ the bedside and fills in  details.  No recent h/o chest pain.  Inpatient Medications    . calcium carbonate  3 tablet Oral Daily  . docusate sodium  100 mg Oral Daily  . furosemide  20 mg Intravenous Q12H  . levothyroxine  88 mcg Oral QAC breakfast  . metoprolol tartrate  12.5 mg Oral BID  . midodrine  5 mg Oral BID WC  . mirtazapine  15 mg Oral QHS  . multivitamin with minerals  1 tablet Oral Daily  . multivitamin-lutein  1 capsule Oral Daily  . oseltamivir  30 mg Oral BID  . simvastatin  5 mg Oral QPM  . sodium chloride flush  3 mL Intravenous Q12H  . warfarin  4 mg Oral ONCE-1800  . Warfarin - Pharmacist Dosing Inpatient   Does not apply q1800    Family History    Family History  Problem Relation Age of Onset  . Heart disease Mother 63  . Stroke Father 18  . Heart disease Sister   . Cancer Brother     colon  . Diabetes Brother   . Leukemia Sister     Social History    Social History   Social History  . Marital status: Widowed    Spouse name: N/A  . Number of children: N/A  . Years of education: N/A   Occupational History  . Not on file.   Social History Main Topics  . Smoking status: Former Smoker    Types: Cigarettes    Quit date: 09/14/1947  . Smokeless tobacco: Never Used  . Alcohol use No  . Drug use: No  . Sexual activity: Not Currently   Other Topics Concern  . Not on file   Social History Narrative   Very independent    Lives in [place] by herself            Review of Systems    General:  No chills, fever, night sweats or weight changes.  Cardiovascular:  No chest pain, +++ dyspnea on exertion, +++ bilat LE edema, no orthopnea, palpitations, paroxysmal nocturnal dyspnea. Dermatological: No rash, lesions/masses.  Chronic venous stasis changes to bilat ankles/feet. Respiratory: +++ cough, dyspnea Urologic: No hematuria, dysuria Abdominal:   No nausea, vomiting, diarrhea, bright red blood per rectum, melena, or hematemesis.  +++ anorexia recently. Neurologic:  No  visual changes, wkns, changes in mental status. All other systems reviewed and are otherwise negative except as noted above.  Physical Exam    Blood pressure (!) 93/48, pulse 83, temperature 98.2 F (36.8 C), temperature source Oral, resp. rate 20, height  5\' 7"  (1.702 m), weight 135 lb 12.8 oz (61.6 kg), SpO2 93 %.  General: Pleasant, NAD.  Nods on and off during interview.  Cooperates w/ exam. Psych: flat affect. Neuro: Alert and oriented X 3. Moves all extremities spontaneously. HEENT: Normal  Neck: Supple without bruits.  JVP ~ 12 cm. Lungs:  Resp regular and unlabored, crackles 1/2 up bilat with scattered rhonchi. Heart: IR, IR, 2/6 syst m @ LLSB, no s3, s4. Abdomen: Soft, non-tender, non-distended, BS + x 4.  Extremities: No clubbing, cyanosis.  1+ LLE edema, trace to 1+ RLE edema. DP/PT/Radials 2+ and equal bilaterally.  Labs     Recent Labs  10/10/16 1115 10/10/16 1457 10/10/16 1849 10/10/16 2251  TROPONINI 0.03* <0.03 <0.03 0.03*   Lab Results  Component Value Date   WBC 6.5 10/11/2016   HGB 11.8 (L) 10/11/2016   HCT 34.8 (L) 10/11/2016   MCV 93.4 10/11/2016   PLT 89 (L) 10/11/2016    Recent Labs Lab 10/10/16 1115 10/11/16 0528  NA 142 142  K 4.2 3.8  CL 110 110  CO2 24 23  BUN 32* 29*  CREATININE 1.27* 1.24*  CALCIUM 9.4 8.8*  PROT 6.5  --   BILITOT 1.6*  --   ALKPHOS 59  --   ALT 14  --   AST 33  --   GLUCOSE 118* 104*   Lab Results  Component Value Date   CHOL 147 05/12/2015   HDL 46.00 05/12/2015   LDLCALC 83 05/12/2015   TRIG 91.0 05/12/2015    Lab Results  Component Value Date   INR 2.72 10/11/2016   INR 2.71 10/10/2016   INR 3.3 10/04/2016    Radiology Studies    Dg Chest 2 View  Result Date: 10/10/2016 CLINICAL DATA:  Shortness of breath, cough EXAM: CHEST  2 VIEW COMPARISON:  09/28/2016 FINDINGS: Cardiomegaly with vascular congestion. Bibasilar opacities, likely atelectasis. Small effusions. No overt edema. IMPRESSION:  Cardiomegaly with vascular congestion. Bibasilar opacities, likely atelectasis with small effusions. Electronically Signed   By: Rolm Baptise M.D.   On: 10/10/2016 11:43   Dg Chest 2 View  Result Date: 09/29/2016 CLINICAL DATA:  Chest congestion EXAM: CHEST  2 VIEW COMPARISON:  04/02/2016 FINDINGS: Bibasilar opacities, favored to reflect atelectasis/scarring, although bilateral lower lobe pneumonia is not entirely excluded. Underlying chronic interstitial markings/emphysematous changes. Biapical pleural-parenchymal scarring. No pleural effusion or pneumothorax. Cardiomegaly. Exaggerated thoracic kyphosis. Lower thoracic vertebral augmentation. IMPRESSION: Bibasilar opacities, favored to reflect atelectasis/ scarring, although bilateral lower lobe pneumonia is not entirely excluded. Electronically Signed   By: Julian Hy M.D.   On: 09/29/2016 08:14    ECG & Cardiac Imaging    None performed.  Tele - Afib, rate controlled.  Assessment & Plan    1.  Influenza A:  Pt presented with progressive dyspnea and cough.  Found to have volume overload and also tested + for flu.  Tamiflu per IM.  2.  ICM/Acute on chronic systolic CHF:  Pt has had ~ 8 lbs wt gain over the past 2 wks, which did not responds to outpatient oral diuretics.  CXR on admission showed CHF. She received 40 of iv lasix in the ED and is now on 20 q12.  She is minus 1.9L since admission.  Wt is pending this am.  She has crackles on exam and neck veins are elevated (in setting of mild to mod TR).  Cont IV lasix.  Dry wt presumed to be 127 lbs by dtr.  Cont  blocker.  No acei/arb/spiro/entresto in setting of relative hypotension with h/o orthostasis req midodrine.  3.  Essential HTN:  bp stable to soft.  4.  HL:  Cont statin.  5.  Chronic Afib:  INR rx.  Rate controlled on  blocker.  6.  CAD/elevated trop:  H/o MI and DES x 2 in 2004 @ [place].  Neg MV in 2016.  No recent c/p.  Minimal trop elevation (0.03) with flat trend.  This  likely represents demand ischemia in the setting of flu and CHF.  Cont med Rx w/  blocker and statin. No asa in setting of chronic coumadin.  7.  Normocytic anemia:  Stable.  8.  Thrombocytopenia:  New dx.  Follow.  Signed, Murray Hodgkins, NP 10/11/2016, 10:33 AM

## 2016-10-11 NOTE — Progress Notes (Signed)
[facility] at [place] NAME: [name]    MR#:  [id number]  DATE OF BIRTH:  May 23, 1922  SUBJECTIVE:  CHIEF COMPLAINT:   Chief Complaint  Patient presents with  . Shortness of Breath   - sleeping most of the time, arousable to answer questions - daughter at bedside - complains of productive cough with mucoid phlegm, still has low grade fevers - admitted with flu and CHF  REVIEW OF SYSTEMS:  Review of Systems  Constitutional: Positive for fever and malaise/fatigue. Negative for chills.  HENT: Positive for hearing loss. Negative for congestion, ear discharge and nosebleeds.   Eyes: Negative for blurred vision and double vision.  Respiratory: Positive for cough, sputum production and shortness of breath. Negative for wheezing.   Cardiovascular: Negative for chest pain, palpitations and leg swelling.  Gastrointestinal: Negative for abdominal pain, constipation, diarrhea, nausea and vomiting.  Genitourinary: Negative for dysuria.  Musculoskeletal: Negative for myalgias.  Neurological: Positive for weakness. Negative for dizziness, sensory change, speech change, focal weakness, seizures and headaches.  Psychiatric/Behavioral: Negative for depression.    DRUG ALLERGIES:   Allergies  Allergen Reactions  . Sulfonamide Derivatives Nausea And Vomiting    REACTION: sick  . Alendronate Sodium Other (See Comments)    REACTION: GI    VITALS:  Blood pressure 118/68, pulse 84, temperature 99.3 F (37.4 C), temperature source Oral, resp. rate 18, height 5\' 7"  (1.702 m), weight 61.6 kg (135 lb 12.8 oz), SpO2 92 %.  PHYSICAL EXAMINATION:  Physical Exam  GENERAL:  83 y.o.-year-old elderly patient lying in the bed with no acute distress.  EYES: Pupils equal, round, reactive to light and accommodation. No scleral icterus. Extraocular muscles intact.  HEENT: Head atraumatic, normocephalic. Oropharynx and nasopharynx clear.  NECK:  Supple, no  jugular venous distention. No thyroid enlargement, no tenderness.  LUNGS: coarse breath sounds bilaterally, no wheezing, rales,rhonchi or crepitation. No use of accessory muscles of respiration.  CARDIOVASCULAR: S1, S2 normal. No rubs, or gallops. 3/6 systolic murmur present. ABDOMEN: Soft, nontender, nondistended. Bowel sounds present. No organomegaly or mass.  EXTREMITIES: No cyanosis, or clubbing. Trace ankle edema present. NEUROLOGIC: Cranial nerves II through XII are intact. Muscle strength 5/5 in all extremities. Sensation intact. Gait not checked. Global weakness noted. PSYCHIATRIC: The patient is alert and oriented x 3.  SKIN: No obvious rash, lesion, or ulcer.    LABORATORY PANEL:   CBC  Recent Labs Lab 10/11/16 0528  WBC 6.5  HGB 11.8*  HCT 34.8*  PLT 89*   ------------------------------------------------------------------------------------------------------------------  Chemistries   Recent Labs Lab 10/10/16 1115 10/11/16 0528  NA 142 142  K 4.2 3.8  CL 110 110  CO2 24 23  GLUCOSE 118* 104*  BUN 32* 29*  CREATININE 1.27* 1.24*  CALCIUM 9.4 8.8*  AST 33  --   ALT 14  --   ALKPHOS 59  --   BILITOT 1.6*  --    ------------------------------------------------------------------------------------------------------------------  Cardiac Enzymes  Recent Labs Lab 10/10/16 2251  TROPONINI 0.03*   ------------------------------------------------------------------------------------------------------------------  RADIOLOGY:  Dg Chest 2 View  Result Date: 10/10/2016 CLINICAL DATA:  Shortness of breath, cough EXAM: CHEST  2 VIEW COMPARISON:  09/28/2016 FINDINGS: Cardiomegaly with vascular congestion. Bibasilar opacities, likely atelectasis. Small effusions. No overt edema. IMPRESSION: Cardiomegaly with vascular congestion. Bibasilar opacities, likely atelectasis with small effusions. Electronically Signed   By: Rolm Baptise M.D.   On: 10/10/2016 11:43    EKG:    Orders placed  or performed in visit on 08/26/16  . EKG 12-Lead    ASSESSMENT AND PLAN:   83 year old female with past medical history significant for CAD, ischemic cardiomyopathy last known EF of 30-35%, chronic atrial fibrillation on Coumadin, COPD not on home oxygen, hypertension, until branch block abnormality, osteoporosis admitted from assisted living facility secondary to worsening shortness of breath and hypoxia.  #1 acute on chronic systolic CHF exacerbation-monitor on telemetry. -Continue IV Lasix for 1 more day. O2 supplementation as needed. Currently weaned off to room air. -Appreciate cardiology consult - On low-dose metoprolol which was held this morning due to low blood pressure. Lisinopril has been discontinued as outpatient due to relative hypotension  #2 influenza A illness-no evidence of any bronchitis. But complains of productive cough. -Continue Tamiflu for now. Follow up chest x-ray in a.m.  #3 hypertension-blood pressure is low normal. Hold metoprolol if needed. On midodrine for history of orthostasis  #4 chronic atrial fibrillation-metoprolol if blood pressure is able to tolerate. Continue Coumadin, INR is therapeutic.  #5 CK D stage III-creatinine stable around 1.2. Avoid nephrotoxins. Monitor carefully while on Lasix.  #6 thrombocytopenia-new diagnosis. Closely monitor. No active bleeding and no indication for transfusion at this time  #7 DVT prophylaxis-on Coumadin   Physical therapy consult when able to   All the records are reviewed and case discussed with Care Management/Social Workerr. Management plans discussed with the patient, family and they are in agreement.  CODE STATUS: DNR  TOTAL TIME TAKING CARE OF THIS PATIENT: 38 minutes.   POSSIBLE D/C IN 1-2 DAYS, DEPENDING ON CLINICAL CONDITION.   Gladstone Lighter M.D on 10/11/2016 at 3:03 PM  Between 7am to 6pm - Pager - [phone]  After 6pm go to [email or web address] - password [email or web address]  [facility]  Office  [phone]  CC: Primary care physician; Loura Pardon, MD

## 2016-10-11 NOTE — Progress Notes (Signed)
ANTICOAGULATION CONSULT NOTE - Follow Up Consult  Pharmacy Consult for Warfarin dosing Indication: atrial fibrillation  Allergies  Allergen Reactions  . Sulfonamide Derivatives Nausea And Vomiting    REACTION: sick  . Alendronate Sodium Other (See Comments)    REACTION: GI    Patient Measurements: Height: 5\' 7"  (170.2 cm) Weight: 135 lb 12.8 oz (61.6 kg) IBW/kg (Calculated) : 61.6 Heparin Dosing Weight:    Vital Signs: Temp: 98.6 F (37 C) (01/29 0332) Temp Source: Oral (01/29 0332) BP: 129/69 (01/29 0332) Pulse Rate: 82 (01/29 0332)  Labs:  Recent Labs  10/08/16 1458  10/10/16 1115 10/10/16 1155 10/10/16 1457 10/10/16 1849 10/10/16 2251 10/11/16 0528  HGB  --   --  12.6  --   --   --   --  11.8*  HCT  --   --  36.6  --   --   --   --  34.8*  PLT  --   --  93*  --   --   --   --  89*  LABPROT  --   --   --  29.3*  --   --   --  29.4*  INR  --   --   --  2.71  --   --   --  2.72  CREATININE 1.36*  --  1.27*  --   --   --   --  1.24*  TROPONINI  --   < > 0.03*  --  <0.03 <0.03 0.03*  --   < > = values in this interval not displayed.  Estimated Creatinine Clearance: 27.6 mL/min (by C-G formula based on SCr of 1.24 mg/dL (H)).  Assessment: 83 yo female admitted with acute respiratory failure and influenza. Patient on warfarin therapy at home for chronic A. Fib. Pharmacy consulted for warfarin dosing and monitoring while inpatient.   Home Regimen: Warfarin 7.5mg  :Mon, Tue, Weds, Thurs, Sat                             Warfarin 5mg : Sun, Fri   DATE              INR                  DOSE 1/28                 2.71                 4mg       1/29                 2.72  Goal of Therapy:  INR 2-3 Monitor platelets by anticoagulation protocol: Yes   Plan:  Patients INR continues to be therapeutic. Patient was started on Tamiflu (oseltamivir) which can increase risk of bleeding in patients taking warfarin. Will give reduced dose of warfarin 4mg  tonight and check INR  with AM labs.  INRs will be ordered daily while patient is on antimicrobials.    Paulina Fusi, PharmD, BCPS 10/11/2016 9:13 AM

## 2016-10-12 ENCOUNTER — Inpatient Hospital Stay: Payer: PPO

## 2016-10-12 ENCOUNTER — Telehealth: Payer: Self-pay | Admitting: Cardiovascular Disease

## 2016-10-12 DIAGNOSIS — I509 Heart failure, unspecified: Secondary | ICD-10-CM

## 2016-10-12 DIAGNOSIS — J9601 Acute respiratory failure with hypoxia: Secondary | ICD-10-CM

## 2016-10-12 DIAGNOSIS — R0602 Shortness of breath: Secondary | ICD-10-CM

## 2016-10-12 DIAGNOSIS — J101 Influenza due to other identified influenza virus with other respiratory manifestations: Secondary | ICD-10-CM

## 2016-10-12 LAB — BASIC METABOLIC PANEL
Anion gap: 9 (ref 5–15)
BUN: 28 mg/dL — ABNORMAL HIGH (ref 6–20)
CHLORIDE: 105 mmol/L (ref 101–111)
CO2: 26 mmol/L (ref 22–32)
Calcium: 8.4 mg/dL — ABNORMAL LOW (ref 8.9–10.3)
Creatinine, Ser: 1.16 mg/dL — ABNORMAL HIGH (ref 0.44–1.00)
GFR calc non Af Amer: 39 mL/min — ABNORMAL LOW (ref >60)
GFR, EST AFRICAN AMERICAN: 46 mL/min — AB (ref >60)
Glucose, Bld: 83 mg/dL (ref 65–99)
POTASSIUM: 3.3 mmol/L — AB (ref 3.5–5.1)
SODIUM: 140 mmol/L (ref 135–145)

## 2016-10-12 LAB — PROTIME-INR
INR: 2.41
Prothrombin Time: 26.7 seconds — ABNORMAL HIGH (ref 11.4–15.2)

## 2016-10-12 MED ORDER — GUAIFENESIN ER 600 MG PO TB12
1200.0000 mg | ORAL_TABLET | Freq: Two times a day (BID) | ORAL | Status: DC
  Administered 2016-10-12 – 2016-10-14 (×5): 1200 mg via ORAL
  Filled 2016-10-12 (×5): qty 2

## 2016-10-12 MED ORDER — WARFARIN SODIUM 5 MG PO TABS
5.0000 mg | ORAL_TABLET | Freq: Once | ORAL | Status: AC
  Administered 2016-10-12: 5 mg via ORAL
  Filled 2016-10-12: qty 1

## 2016-10-12 MED ORDER — POTASSIUM CHLORIDE 20 MEQ PO PACK
40.0000 meq | PACK | Freq: Every day | ORAL | Status: DC
  Administered 2016-10-12 – 2016-10-14 (×3): 40 meq via ORAL
  Filled 2016-10-12 (×3): qty 2

## 2016-10-12 NOTE — Progress Notes (Signed)
Patient Name: [name] Date of Encounter: 10/12/2016  Primary Cardiologist: [facility] Problem List     Active Problems:   Acute respiratory failure with hypoxia (HCC)     Subjective   Remains SOB this morning. On room air. UOP 3 L for the admission and 1.7 L for the past 24 hours. Renal function improving. Potassium low this AM.   Inpatient Medications    Scheduled Meds: . benzonatate  100 mg Oral TID  . calcium carbonate  3 tablet Oral Daily  . docusate sodium  100 mg Oral Daily  . furosemide  20 mg Intravenous Q12H  . levothyroxine  88 mcg Oral QAC breakfast  . metoprolol tartrate  12.5 mg Oral BID  . midodrine  5 mg Oral BID WC  . mirtazapine  15 mg Oral QHS  . multivitamin with minerals  1 tablet Oral Daily  . multivitamin-lutein  1 capsule Oral Daily  . oseltamivir  30 mg Oral BID  . simvastatin  5 mg Oral QPM  . sodium chloride flush  3 mL Intravenous Q12H  . Warfarin - Pharmacist Dosing Inpatient   Does not apply q1800   Continuous Infusions:  PRN Meds: acetaminophen **OR** acetaminophen, guaiFENesin, ondansetron **OR** ondansetron (ZOFRAN) IV   Vital Signs    Vitals:   10/11/16 0925 10/11/16 1205 10/11/16 1929 10/12/16 0414  BP: (!) 93/48 118/68 (!) 104/53 123/75  Pulse: 83 84 89 83  Resp: 20 18 18 18   Temp: 98.2 F (36.8 C) 99.3 F (37.4 C) 98.1 F (36.7 C) 98.2 F (36.8 C)  TempSrc: Oral Oral Oral Oral  SpO2:  92% 92% 92%  Weight:      Height:        Intake/Output Summary (Last 24 hours) at 10/12/16 0750 Last data filed at 10/12/16 0616  Gross per 24 hour  Intake                0 ml  Output             1750 ml  Net            -1750 ml   Filed Weights   10/10/16 1111  Weight: 135 lb 12.8 oz (61.6 kg)    Physical Exam    GEN: Frail appearing, in no acute distress.  HEENT: Grossly normal.  Neck: Supple, JVD elevated, carotid bruits, or masses. Cardiac: Irregularly irregular, I/VI systolic murmur LUSB, no rubs, or  gallops. No clubbing, cyanosis, edema.  Radials/DP/PT 2+ and equal bilaterally.  Respiratory:  Decreased breath sounds bilaterally with crackles along the bilateral bases.  GI: Soft, nontender, nondistended, BS + x 4. MS: no deformity or atrophy. Skin: warm and dry, no rash. Neuro:  Strength and sensation are intact. Psych: AAOx3.  Normal affect.  Labs    CBC  Recent Labs  10/10/16 1115 10/11/16 0528  WBC 6.4 6.5  HGB 12.6 11.8*  HCT 36.6 34.8*  MCV 94.8 93.4  PLT 93* 89*   Basic Metabolic Panel  Recent Labs  10/11/16 0528 10/12/16 0557  NA 142 140  K 3.8 3.3*  CL 110 105  CO2 23 26  GLUCOSE 104* 83  BUN 29* 28*  CREATININE 1.24* 1.16*  CALCIUM 8.8* 8.4*   Liver Function Tests  Recent Labs  10/10/16 1115  AST 33  ALT 14  ALKPHOS 59  BILITOT 1.6*  PROT 6.5  ALBUMIN 4.0   No results for input(s): LIPASE, AMYLASE in the last  72 hours. Cardiac Enzymes  Recent Labs  10/10/16 1457 10/10/16 1849 10/10/16 2251  TROPONINI <0.03 <0.03 0.03*   BNP Invalid input(s): POCBNP D-Dimer No results for input(s): DDIMER in the last 72 hours. Hemoglobin A1C No results for input(s): HGBA1C in the last 72 hours. Fasting Lipid Panel No results for input(s): CHOL, HDL, LDLCALC, TRIG, CHOLHDL, LDLDIRECT in the last 72 hours. Thyroid Function Tests No results for input(s): TSH, T4TOTAL, T3FREE, THYROIDAB in the last 72 hours.  Invalid input(s): FREET3  Telemetry    Afib, 80's bpm, occasional PVCs - Personally Reviewed  ECG    n/a - Personally Reviewed  Radiology    Dg Chest 2 View  Result Date: 10/10/2016 CLINICAL DATA:  Shortness of breath, cough EXAM: CHEST  2 VIEW COMPARISON:  09/28/2016 FINDINGS: Cardiomegaly with vascular congestion. Bibasilar opacities, likely atelectasis. Small effusions. No overt edema. IMPRESSION: Cardiomegaly with vascular congestion. Bibasilar opacities, likely atelectasis with small effusions. Electronically Signed   By: Rolm Baptise M.D.   On: 10/10/2016 11:43    Cardiac Studies   Echo 01/2016: Study Conclusions  - Left ventricle: The cavity size was normal. Systolic function was   moderately to severely reduced. The estimated ejection fraction   was in the range of 30% to 35%. Hypokinesis of the anterior   myocardium. Hypokinesis of the anteroseptal myocardium. Left   ventricular diastolic function parameters were normal. - Aortic root: The aortic root was mildly dilated, 3.4 cm,   ascending aorta mildly dilated 3.5 cm - Mitral valve: There was mild regurgitation. - Left atrium: The atrium was mildly dilated. - Right atrium: The atrium was mildly dilated. - Tricuspid valve: There was mild-moderate regurgitation. - Pulmonary arteries: PA peak pressure: 33 mm Hg (S).  Patient Profile     83 y.o. female with history of CAD, ICM, Chronic systolic CHF, HTN, HL, chronic AF on coumadin, and PAH, who was admitted 1/28 with Flu and CHF.  Assessment & Plan    1. Acute respiratory distress with hypoxia: -Likely multifactorial in the setting of influenza A and acute on chronic systolic CHF -On room air  2. ICM/acute on chronic systolic CHF: -Diuresing well as above -Continue IV Lasix with KCl repletion -SOB persists along with crackles and elevated JVD -Not on ACEi/ARB/spiro/Entresto 2/2 relative hypotension with history of orthostasis requiring midodrine  3. Influenza A: -Tamiflu per IM  4. Chronic Afib: -Ventricular rate remains well controlled -Continue beta blocker and Coumadin -CHADS2VASc at least 5 (CHF, age x 2, vascular disease, female)  5. CAD/elevated troponin: -H/o MI and DES x 2 in 2004 @ [place].  Neg MV in 2016.  No recent c/p.  Minimal trop elevation (0.03) with flat trend.  This likely represents demand ischemia in the setting of flu and CHF.  Cont med Rx w/ ? blocker and statin. No asa in setting of chronic coumadin  6. Thrombocytopenia: -Per IM  7. Anemia: -CBC pending this  AM  Signed, Christell Faith, PA-C [facility] Pager: [phone] 10/12/2016, 7:50 AM   Attending Note Patient seen and examined, agree with detailed note above,  Patient presentation and plan discussed on rounds.   No significant events overnight, continued thick cough, mildly productive She reports mild improvement in her shortness of breath Influenza A positive  Lab work reviewed with low potassium 3.3  On physical exam, no significant JVD, coarse breath sounds bilaterally, heart sounds regular with no murmurs appreciated, abdomen thin, soft nontender, no significant lower extremity edema  ---  Respiratory distress Unable to exclude underlying bronchitis given thick productive cough Suspect reason for her presentation is probably secondary to viral influenza  Given low ejection fraction, would continue gentle diuresis with close eye on blood pressure given hypotension this morning Clinically appears to be consistent with URI from the flu Continue supportive care. If no improvement of symptoms, may need to add antibiotics, She may benefit from nebulizers and guaifenesin To help break up secretions  --- Cardiomyopathy Chronic systolic CHF Continue very gentle diuresis, could decrease down to once a day as presentation were consistent with viral URI   Greater than 50% was spent in counseling and coordination of care with patient Total encounter time 25 minutes or more   Signed: Esmond Plants  M.D., Ph.D. [facility]

## 2016-10-12 NOTE — Progress Notes (Signed)
Initial HF Clinic appointment scheduled on October 20, 2016 at 11:00am. Thank you.

## 2016-10-12 NOTE — Evaluation (Signed)
Physical Therapy Evaluation Patient Details Name: [name] MRN: [id number] DOB: October 07, 1921 Today's Date: 10/12/2016   History of Present Illness  Pt is a 83 y/o F who presented with SOB, cough, weakness.  Pt was found be in acute respiratory failure and hypoxia secondary to CHF and Flu.  Pt's PMH includes CAD s/p stent placement, a-fib, COPD, osteoporosis, MI, lumbar laminectomy.    Clinical Impression  Pt admitted with above diagnosis. Pt currently with functional limitations due to the deficits listed below (see PT Problem List). Ms. Curlee presents with generalized weakness and requires min guard assist for all mobility.  At baseline she requires assist for bathing and dressing.  She lives alone.  Given pt's current mobility status, recommending short term SNF at d/c.  She may benefit from transitioning to assisted living or to another setting where a greater level of supervision will be available following SNF stay, request SW involvement for this. Pt will benefit from skilled PT to increase their independence and safety with mobility to allow discharge to the venue listed below.      Follow Up Recommendations SNF    Equipment Recommendations  None recommended by PT    Recommendations for Other Services       Precautions / Restrictions Precautions Precautions: Fall Restrictions Weight Bearing Restrictions: No      Mobility  Bed Mobility Overal bed mobility: Needs Assistance Bed Mobility: Supine to Sit     Supine to sit: Min guard;HOB elevated     General bed mobility comments: Increased time and effort, especially with scooting to EOB  Transfers Overall transfer level: Needs assistance Equipment used: Rolling walker (2 wheeled) Transfers: Sit to/from Stand Sit to Stand: Min guard         General transfer comment: Close min guard as pt mildly unsteady.  Pt is slow to rise.  Ambulation/Gait Ambulation/Gait assistance: Min guard Ambulation Distance  (Feet): 40 Feet Assistive device: Rolling walker (2 wheeled) Gait Pattern/deviations: Step-through pattern;Decreased stride length;Trunk flexed Gait velocity: decreased Gait velocity interpretation: Below normal speed for age/gender General Gait Details: Pt with decreased gait speed and flexed posture.  Close min guard assist provided.  Pt demonstrates difficulty maneuvering RW around obstacles in her room.  Stairs            Wheelchair Mobility    Modified Rankin (Stroke Patients Only)       Balance Overall balance assessment: Needs assistance Sitting-balance support: No upper extremity supported;Feet supported Sitting balance-Leahy Scale: Good     Standing balance support: During functional activity;Bilateral upper extremity supported Standing balance-Leahy Scale: Poor Standing balance comment: Relies on UE support                             Pertinent Vitals/Pain Pain Assessment: No/denies pain    Home Living Family/patient expects to be discharged to:: Private residence Living Arrangements: Alone Available Help at Discharge: Family;Available PRN/intermittently;Personal care attendant Type of Home: Independent living facility Home Access: Level entry     Home Layout: One level Home Equipment: Grab bars - tub/shower;Grab bars - toilet;Shower seat;Bedside commode;Walker - 4 wheels Additional Comments: Pt has aide who comes 8am-10am each morning and 7pm-8pm each night 7 days/wk    Prior Function Level of Independence: Needs assistance   Gait / Transfers Assistance Needed: Pt ambulates with rollator and denies any falls in the past year.  She does mention that she keeps her [facility] at her bedside.  ADL's / Homemaking Assistance Needed: Pt requires assist from aide for bathing, dressing.  Food is provided by ILF.          Hand Dominance        Extremity/Trunk Assessment   Upper Extremity Assessment Upper Extremity Assessment:  (BUE strength grossly  4-/5)    Lower Extremity Assessment Lower Extremity Assessment: RLE deficits/detail;LLE deficits/detail RLE Deficits / Details: Strength grossly 4/5 LLE Deficits / Details: Strength grossly 4/5    Cervical / Trunk Assessment Cervical / Trunk Assessment: Kyphotic  Communication   Communication: HOH  Cognition Arousal/Alertness: Awake/alert Behavior During Therapy: WFL for tasks assessed/performed Overall Cognitive Status: Within Functional Limits for tasks assessed                      General Comments      Exercises General Exercises - Upper Extremity Shoulder Flexion: AROM;Both;10 reps;Seated General Exercises - Lower Extremity Long Arc Quad: Strengthening;Both;10 reps;Seated Hip Flexion/Marching: Both;10 reps;Seated Other Exercises Other Exercises: Pt encouraged to ambulate in room with nursing staff at least one more time today.   Assessment/Plan    PT Assessment Patient needs continued PT services  PT Problem List Decreased strength;Decreased activity tolerance;Decreased balance;Decreased knowledge of use of DME;Decreased safety awareness          PT Treatment Interventions DME instruction;Gait training;Functional mobility training;Therapeutic activities;Therapeutic exercise;Balance training;Neuromuscular re-education;Patient/family education    PT Goals (Current goals can be found in the Care Plan section)  Acute Rehab PT Goals Patient Stated Goal: to go home PT Goal Formulation: With patient Time For Goal Achievement: 10/26/16 Potential to Achieve Goals: Good    Frequency Min 2X/week   Barriers to discharge Decreased caregiver support Lives alone    Co-evaluation               End of Session Equipment Utilized During Treatment: Gait belt Activity Tolerance: Patient limited by fatigue;Patient tolerated treatment well Patient left: in chair;with call bell/phone within reach;with chair alarm set Nurse Communication: Mobility status          Time: 1416-1440 PT Time Calculation (min) (ACUTE ONLY): 24 min   Charges:   PT Evaluation $PT Eval Low Complexity: 1 Procedure PT Treatments $Therapeutic Exercise: 8-22 mins   PT G Codes:        Collie Siad PT, DPT 10/12/2016, 3:08 PM

## 2016-10-12 NOTE — Care Management (Signed)
Patient is a resident at [facility].  She is admitted with congestive heart failure and influenza.  physical therapy is recommending skilled nursing short term.  Requiring some medication adjustments due to hypotension. A referral has been made with the heart failure clinic.  Patient is not sure about going to skilled nursing.

## 2016-10-12 NOTE — Telephone Encounter (Signed)
We have to defer this to IM as they are primary service.

## 2016-10-12 NOTE — Progress Notes (Signed)
[place] [facility] [place] NAME: [name]    MR#:  [id number]  DATE OF BIRTH:  1921/10/28  SUBJECTIVE:  CHIEF COMPLAINT:   Chief Complaint  Patient presents with  . Shortness of Breath   - appears stronger today, still has cough - off oxygen  REVIEW OF SYSTEMS:  Review of Systems  Constitutional: Negative for chills, fever and malaise/fatigue.  HENT: Positive for hearing loss. Negative for congestion, ear discharge and nosebleeds.   Eyes: Negative for blurred vision and double vision.  Respiratory: Positive for cough and sputum production. Negative for shortness of breath and wheezing.   Cardiovascular: Negative for chest pain, palpitations and leg swelling.  Gastrointestinal: Negative for abdominal pain, constipation, diarrhea, nausea and vomiting.  Genitourinary: Negative for dysuria.  Musculoskeletal: Negative for myalgias.  Neurological: Positive for weakness. Negative for dizziness, sensory change, speech change, focal weakness, seizures and headaches.  Psychiatric/Behavioral: Negative for depression.    DRUG ALLERGIES:   Allergies  Allergen Reactions  . Sulfonamide Derivatives Nausea And Vomiting    REACTION: sick  . Alendronate Sodium Other (See Comments)    REACTION: GI    VITALS:  Blood pressure 119/72, pulse 92, temperature 98.1 F (36.7 C), temperature source Oral, resp. rate 20, height 5\' 7"  (1.702 m), weight 61.6 kg (135 lb 12.8 oz), SpO2 95 %.  PHYSICAL EXAMINATION:  Physical Exam  GENERAL:  83 y.o.-year-old elderly patient lying in the bed with no acute distress.  EYES: Pupils equal, round, reactive to light and accommodation. No scleral icterus. Extraocular muscles intact.  HEENT: Head atraumatic, normocephalic. Oropharynx and nasopharynx clear.  NECK:  Supple, no jugular venous distention. No thyroid enlargement, no tenderness.  LUNGS: Improved breath sounds bilaterally, no wheezing, rales\ or  crepitation. Scattered rhonchi heard. No use of accessory muscles of respiration.  CARDIOVASCULAR: S1, S2 normal. No rubs, or gallops. 3/6 systolic murmur present. ABDOMEN: Soft, nontender, nondistended. Bowel sounds present. No organomegaly or mass.  EXTREMITIES: No cyanosis, or clubbing. Trace ankle edema present. NEUROLOGIC: Cranial nerves II through XII are intact. Muscle strength 5/5 in all extremities. Sensation intact. Gait not checked. Global weakness noted. PSYCHIATRIC: The patient is alert and oriented x 3.  SKIN: No obvious rash, lesion, or ulcer.    LABORATORY PANEL:   CBC  Recent Labs Lab 10/11/16 0528  WBC 6.5  HGB 11.8*  HCT 34.8*  PLT 89*   ------------------------------------------------------------------------------------------------------------------  Chemistries   Recent Labs Lab 10/10/16 1115  10/12/16 0557  NA 142  < > 140  K 4.2  < > 3.3*  CL 110  < > 105  CO2 24  < > 26  GLUCOSE 118*  < > 83  BUN 32*  < > 28*  CREATININE 1.27*  < > 1.16*  CALCIUM 9.4  < > 8.4*  AST 33  --   --   ALT 14  --   --   ALKPHOS 59  --   --   BILITOT 1.6*  --   --   < > = values in this interval not displayed. ------------------------------------------------------------------------------------------------------------------  Cardiac Enzymes  Recent Labs Lab 10/10/16 2251  TROPONINI 0.03*   ------------------------------------------------------------------------------------------------------------------  RADIOLOGY:  Dg Chest 2 View  Result Date: 10/12/2016 CLINICAL DATA:  Follow up 83 year old female with known history of coronary artery disease, chronic systolic heart failure and chronic atrial fibrillation who presented with worsening shortness of breath, weight gain and fatigue. She tested positive for the flu and  was found to be in congestive heart failure. She is currently getting Tamiflu and IV furosemide. She reports some improvement in symptoms. EXAM:  CHEST  2 VIEW COMPARISON:  10/10/2016 and older exams. FINDINGS: Stable enlargement of cardiac silhouette. No mediastinal or hilar masses. Irregularly thickened interstitial markings are noted bilaterally, also stable. There is lung base opacity consistent with combination of pleural effusions and atelectasis. No new lung abnormalities. No pneumothorax. Skeletal structures are demineralized. There is an old compression fracture at the thoracolumbar junction previously treated with vertebroplasty. IMPRESSION: 1. No significant change from the most recent prior exam. 2. Cardiomegaly with bilateral interstitial thickening, bilateral pleural effusions and associated lung base atelectasis. Suspect chronic congestive heart failure. No convincing pneumonia. Electronically Signed   By: Lajean Manes M.D.   On: 10/12/2016 08:42    EKG:   Orders placed or performed in visit on 08/26/16  . EKG 12-Lead    ASSESSMENT AND PLAN:   83 year old female with past medical history significant for CAD, ischemic cardiomyopathy last known EF of 30-35%, chronic atrial fibrillation on Coumadin, COPD not on home oxygen, hypertension, until branch block abnormality, osteoporosis admitted from assisted living facility secondary to worsening shortness of breath and hypoxia.  #1 acute on chronic systolic CHF exacerbation-monitor on telemetry. -Continue IV Lasix today. on room air. -Appreciate cardiology consult - On low-dose metoprolol which was held this morning due to low blood pressure. Lisinopril has been discontinued as outpatient due to relative hypotension - monitor closely, net negative by 3L today since admission  #2 influenza A illness-no evidence of any bronchitis. But complains of productive cough. -Continue Tamiflu for now. Follow up chest x-ray with similar findings.  #3 hypertension-blood pressure is low normal. Hold metoprolol if needed. On midodrine for history of orthostasis  #4 chronic atrial  fibrillation-metoprolol if blood pressure is able to tolerate. Continue Coumadin, INR is therapeutic.  #5 CK D stage III-creatinine stable around 1.2. Avoid nephrotoxins. Monitor carefully while on Lasix.  #6 thrombocytopenia-new diagnosis. Closely monitor. No active bleeding and no indication for transfusion at this time  #7 DVT prophylaxis-on Coumadin  #8 Hypokalemia- secondary to being on lasix- potassium supplements ordered by cardiology team   Physical therapy consult today   All the records are reviewed and case discussed with Care Management/Social Workerr. Management plans discussed with the patient, family and they are in agreement.  CODE STATUS: DNR  TOTAL TIME TAKING CARE OF THIS PATIENT: 36 minutes.   POSSIBLE D/C IN 1-2 DAYS, DEPENDING ON CLINICAL CONDITION.   Kasey Hansell M.D on 10/12/2016 at 2:46 PM  Between 7am to 6pm - Pager - [phone]  After 6pm go to [email or web address] - password [email or web address] [facility]  Office  [phone]  CC: Primary care physician; Loura Pardon, MD

## 2016-10-12 NOTE — Progress Notes (Signed)
ANTICOAGULATION CONSULT NOTE - Follow Up Consult  Pharmacy Consult for Warfarin dosing Indication: atrial fibrillation  Allergies  Allergen Reactions  . Sulfonamide Derivatives Nausea And Vomiting    REACTION: sick  . Alendronate Sodium Other (See Comments)    REACTION: GI    Patient Measurements: Height: 5\' 7"  (170.2 cm) Weight: 135 lb 12.8 oz (61.6 kg) IBW/kg (Calculated) : 61.6 Heparin Dosing Weight:    Vital Signs: Temp: 98.1 F (36.7 C) (01/30 1233) Temp Source: Oral (01/30 1233) BP: 119/72 (01/30 1233) Pulse Rate: 92 (01/30 1233)  Labs:  Recent Labs  10/10/16 1115 10/10/16 1155 10/10/16 1457 10/10/16 1849 10/10/16 2251 10/11/16 0528 10/12/16 0557  HGB 12.6  --   --   --   --  11.8*  --   HCT 36.6  --   --   --   --  34.8*  --   PLT 93*  --   --   --   --  89*  --   LABPROT  --  29.3*  --   --   --  29.4* 26.7*  INR  --  2.71  --   --   --  2.72 2.41  CREATININE 1.27*  --   --   --   --  1.24* 1.16*  TROPONINI 0.03*  --  <0.03 <0.03 0.03*  --   --     Estimated Creatinine Clearance: 29.5 mL/min (by C-G formula based on SCr of 1.16 mg/dL (H)).  Assessment: 83 yo female admitted with acute respiratory failure and influenza. Patient on warfarin therapy at home for chronic A. Fib. Pharmacy consulted for warfarin dosing and monitoring while inpatient.   Home Regimen: Warfarin 7.5mg  :Mon, Tue, Weds, Thurs, Sat                             Warfarin 5mg : Sun, Fri   DATE              INR                  DOSE 1/28                 2.71                 4mg       1/29                 2.72                 4mg  1/30                 2.41  Goal of Therapy:  INR 2-3 Monitor platelets by anticoagulation protocol: Yes   Plan:  Patients INR continues to be therapeutic, but did drop slightly. Patient was started on Tamiflu (oseltamivir) which can increase risk of bleeding in patients taking warfarin. Will give warfarin 5mg  tonight and check INR with AM labs.  INRs will  be ordered daily while patient is on antimicrobials.    Paulina Fusi, PharmD, BCPS 10/12/2016 2:09 PM

## 2016-10-12 NOTE — Discharge Instructions (Signed)
Heart Failure Clinic appointment on October 20, 2016 at 11:00am with Darylene Price, [place]. Please call [phone] to reschedule.

## 2016-10-12 NOTE — Telephone Encounter (Signed)
Patient is at [place] for flu.  Patient daughter wants to check with Dr. Rockey Situ that he is ok with her being discharged .  Please call.

## 2016-10-13 DIAGNOSIS — R2681 Unsteadiness on feet: Secondary | ICD-10-CM

## 2016-10-13 DIAGNOSIS — M6281 Muscle weakness (generalized): Secondary | ICD-10-CM

## 2016-10-13 LAB — CBC
HEMATOCRIT: 36.6 % (ref 35.0–47.0)
HEMOGLOBIN: 12.5 g/dL (ref 12.0–16.0)
MCH: 31.9 pg (ref 26.0–34.0)
MCHC: 34.2 g/dL (ref 32.0–36.0)
MCV: 93.5 fL (ref 80.0–100.0)
Platelets: 110 10*3/uL — ABNORMAL LOW (ref 150–440)
RBC: 3.92 MIL/uL (ref 3.80–5.20)
RDW: 16.3 % — AB (ref 11.5–14.5)
WBC: 5.1 10*3/uL (ref 3.6–11.0)

## 2016-10-13 LAB — BASIC METABOLIC PANEL
Anion gap: 9 (ref 5–15)
BUN: 30 mg/dL — ABNORMAL HIGH (ref 6–20)
CALCIUM: 8.3 mg/dL — AB (ref 8.9–10.3)
CHLORIDE: 103 mmol/L (ref 101–111)
CO2: 28 mmol/L (ref 22–32)
CREATININE: 1.33 mg/dL — AB (ref 0.44–1.00)
GFR calc non Af Amer: 33 mL/min — ABNORMAL LOW (ref >60)
GFR, EST AFRICAN AMERICAN: 39 mL/min — AB (ref >60)
GLUCOSE: 92 mg/dL (ref 65–99)
Potassium: 3.7 mmol/L (ref 3.5–5.1)
Sodium: 140 mmol/L (ref 135–145)

## 2016-10-13 LAB — MAGNESIUM: Magnesium: 1.7 mg/dL (ref 1.7–2.4)

## 2016-10-13 LAB — PROTIME-INR
INR: 2.33
Prothrombin Time: 26 seconds — ABNORMAL HIGH (ref 11.4–15.2)

## 2016-10-13 MED ORDER — OSELTAMIVIR PHOSPHATE 30 MG PO CAPS: 30.0000 mg | ORAL_CAPSULE | Freq: Every day | ORAL | 0 refills | Status: DC

## 2016-10-13 MED ORDER — BENZONATATE 100 MG PO CAPS: 100.0000 mg | ORAL_CAPSULE | Freq: Three times a day (TID) | ORAL | 0 refills | Status: DC

## 2016-10-13 MED ORDER — WARFARIN SODIUM 3 MG PO TABS
6.0000 mg | ORAL_TABLET | Freq: Once | ORAL | Status: AC
  Administered 2016-10-13: 6 mg via ORAL
  Filled 2016-10-13: qty 2

## 2016-10-13 MED ORDER — FUROSEMIDE 20 MG PO TABS
20.0000 mg | ORAL_TABLET | ORAL | Status: DC
  Administered 2016-10-14: 20 mg via ORAL
  Filled 2016-10-13: qty 1

## 2016-10-13 MED ORDER — OSELTAMIVIR PHOSPHATE 30 MG PO CAPS
30.0000 mg | ORAL_CAPSULE | Freq: Every day | ORAL | Status: DC
  Administered 2016-10-13 – 2016-10-14 (×2): 30 mg via ORAL
  Filled 2016-10-13 (×2): qty 1

## 2016-10-13 MED ORDER — FUROSEMIDE 20 MG PO TABS: 20.0000 mg | ORAL_TABLET | Freq: Every day | ORAL | 1 refills | Status: DC

## 2016-10-13 MED ORDER — FUROSEMIDE 20 MG PO TABS: 20.0000 mg | ORAL_TABLET | Freq: Every day | ORAL | Status: DC

## 2016-10-13 MED ORDER — GUAIFENESIN ER 600 MG PO TB12: 1200.0000 mg | ORAL_TABLET | Freq: Two times a day (BID) | ORAL | 0 refills | Status: DC

## 2016-10-13 NOTE — Care Management (Signed)
[facility] can provide home health SN and PT.  [name] is not able to assist with transport home tomorrow so if daughter is not able and patient can discharge home, will have to travel by ems which would not be covered by insurance.  discussed this with patient's daughter.

## 2016-10-13 NOTE — Progress Notes (Signed)
ANTICOAGULATION CONSULT NOTE - Follow Up Consult  Pharmacy Consult for Warfarin dosing Indication: atrial fibrillation  Allergies  Allergen Reactions  . Sulfonamide Derivatives Nausea And Vomiting    REACTION: sick  . Alendronate Sodium Other (See Comments)    REACTION: GI    Patient Measurements: Height: 5\' 7"  (170.2 cm) Weight: 135 lb 12.8 oz (61.6 kg) IBW/kg (Calculated) : 61.6 Heparin Dosing Weight:    Vital Signs: Temp: 97.7 F (36.5 C) (01/31 0420) Temp Source: Oral (01/31 0420) BP: 118/71 (01/31 0420) Pulse Rate: 75 (01/31 0420)  Labs:  Recent Labs  10/10/16 1115  10/10/16 1457 10/10/16 1849 10/10/16 2251 10/11/16 0528 10/12/16 0557 10/13/16 0542  HGB 12.6  --   --   --   --  11.8*  --  12.5  HCT 36.6  --   --   --   --  34.8*  --  36.6  PLT 93*  --   --   --   --  89*  --  110*  LABPROT  --   < >  --   --   --  29.4* 26.7* 26.0*  INR  --   < >  --   --   --  2.72 2.41 2.33  CREATININE 1.27*  --   --   --   --  1.24* 1.16* 1.33*  TROPONINI 0.03*  --  <0.03 <0.03 0.03*  --   --   --   < > = values in this interval not displayed.  Estimated Creatinine Clearance: 25.7 mL/min (by C-G formula based on SCr of 1.33 mg/dL (H)).  Assessment: 83 yo female admitted with acute respiratory failure and influenza. Patient on warfarin therapy at home for chronic A. Fib. Pharmacy consulted for warfarin dosing and monitoring while inpatient.   Home Regimen: Warfarin 7.5mg  :Mon, Tue, Weds, Thurs, Sat                             Warfarin 5mg : Sun, Fri   DATE              INR                  DOSE 1/28                 2.71                 4 mg      1/29                 2.72                 4 mg 1/30                 2.41  5 mg 1/31  2.33  Goal of Therapy:  INR 2-3 Monitor platelets by anticoagulation protocol: Yes   Plan:  Patients INR continues to be therapeutic, but did drop slightly again. Patient was started on Tamiflu (oseltamivir) which can increase risk of  bleeding in patients taking warfarin.  Will order warfarin 6 mg x 1 tonight and check INR with AM labs.  INRs will be ordered daily while patient is on antimicrobials.    Chinita Greenland PharmD Clinical Pharmacist 10/13/2016

## 2016-10-13 NOTE — Progress Notes (Signed)
PHARMACY NOTE:  RENAL DOSAGE ADJUSTMENT   Current medication dosage:   Tamiflu (Osteltamivir) 30mg  po BID  Indication: Influenza +  Renal Function:  Scr 1.33  Crcl 25.7 ml/min  Estimated Creatinine Clearance: 25.7 mL/min (by C-G formula based on SCr of 1.33 mg/dL (H)).     Medication dosage has been changed to:   Tamiflu 30 mg PO Once daily.   Additional comments:   Thank you for allowing pharmacy to be a part of this patient's care.  Hearl Heikes A, [facility] 10/13/2016 7:28 AM

## 2016-10-13 NOTE — Telephone Encounter (Signed)
Patient daughter calling back to let us know she thinks she now has the flu.  She would like it if Dr. [facility] would keep her mom atleast one more day.  Notified daughter of Ryan's previous note and suggested she call nursing unit to discuss dc plan and concerns with care manager.  Given number to floor [phone] and she will call and ask for cm to discuss.

## 2016-10-13 NOTE — Progress Notes (Signed)
Patient Name: [name] Date of Encounter: 10/13/2016  Primary Cardiologist: [facility] Problem List     Active Problems:   Acute respiratory failure with hypoxia (HCC)   Congestive heart failure (HCC)   Influenza A     Subjective   No acute issues overnight. Breathing improving. Renal function bumped this AM. Still with intermittent hypotension requiring holding of beta blocker. UOP 1.7 L for the past 24 hours.   Inpatient Medications    Scheduled Meds: . benzonatate  100 mg Oral TID  . calcium carbonate  3 tablet Oral Daily  . docusate sodium  100 mg Oral Daily  . [START ON 10/14/2016] furosemide  20 mg Oral Daily  . guaiFENesin  1,200 mg Oral BID  . levothyroxine  88 mcg Oral QAC breakfast  . metoprolol tartrate  12.5 mg Oral BID  . midodrine  5 mg Oral BID WC  . mirtazapine  15 mg Oral QHS  . multivitamin with minerals  1 tablet Oral Daily  . multivitamin-lutein  1 capsule Oral Daily  . oseltamivir  30 mg Oral Daily  . potassium chloride  40 mEq Oral Daily  . simvastatin  5 mg Oral QPM  . sodium chloride flush  3 mL Intravenous Q12H  . warfarin  6 mg Oral ONCE-1800  . Warfarin - Pharmacist Dosing Inpatient   Does not apply q1800   Continuous Infusions:  PRN Meds: acetaminophen **OR** acetaminophen, guaiFENesin, ondansetron **OR** ondansetron (ZOFRAN) IV   Vital Signs    Vitals:   10/12/16 1233 10/12/16 1927 10/12/16 2153 10/13/16 0420  BP: 119/72 (!) 98/46  118/71  Pulse: 92 84 89 75  Resp:  16  17  Temp: 98.1 F (36.7 C) 97.5 F (36.4 C)  97.7 F (36.5 C)  TempSrc: Oral Oral  Oral  SpO2: 95% 94%  91%  Weight:      Height:        Intake/Output Summary (Last 24 hours) at 10/13/16 0744 Last data filed at 10/13/16 0717  Gross per 24 hour  Intake                3 ml  Output             2125 ml  Net            -2122 ml   Filed Weights   10/10/16 1111  Weight: 135 lb 12.8 oz (61.6 kg)    Physical Exam    GEN: Well nourished,  well developed, in no acute distress.  HEENT: Grossly normal.  Neck: Supple, JVD elevated ~ 6 cm, no carotid bruits, or masses. Cardiac: Irregularly irregular, no murmurs, rubs, or gallops. No clubbing, cyanosis, edema.  Radials/DP/PT 2+ and equal bilaterally.  Respiratory:  Respirations regular and unlabored, clear to auscultation bilaterally. GI: Soft, nontender, nondistended, BS + x 4. MS: no deformity or atrophy. Skin: warm and dry, no rash. Neuro:  Strength and sensation are intact. Psych: AAOx3.  Normal affect.  Labs    CBC  Recent Labs  10/11/16 0528 10/13/16 0542  WBC 6.5 5.1  HGB 11.8* 12.5  HCT 34.8* 36.6  MCV 93.4 93.5  PLT 89* [id number]*   Basic Metabolic Panel  Recent Labs  10/12/16 0557 10/13/16 0542  NA 140 140  K 3.3* 3.7  CL 105 103  CO2 26 28  GLUCOSE 83 92  BUN 28* 30*  CREATININE 1.16* 1.33*  CALCIUM 8.4* 8.3*   Liver Function Tests  Recent Labs  10/10/16 1115  AST 33  ALT 14  ALKPHOS 59  BILITOT 1.6*  PROT 6.5  ALBUMIN 4.0   No results for input(s): LIPASE, AMYLASE in the last 72 hours. Cardiac Enzymes  Recent Labs  10/10/16 1457 10/10/16 1849 10/10/16 2251  TROPONINI <0.03 <0.03 0.03*   BNP Invalid input(s): POCBNP D-Dimer No results for input(s): DDIMER in the last 72 hours. Hemoglobin A1C No results for input(s): HGBA1C in the last 72 hours. Fasting Lipid Panel No results for input(s): CHOL, HDL, LDLCALC, TRIG, CHOLHDL, LDLDIRECT in the last 72 hours. Thyroid Function Tests No results for input(s): TSH, T4TOTAL, T3FREE, THYROIDAB in the last 72 hours.  Invalid input(s): FREET3  Telemetry    Afib, 70's bpm, occasional PVCs - Personally Reviewed  ECG    n/a - Personally Reviewed  Radiology    Dg Chest 2 View  Result Date: 10/12/2016 CLINICAL DATA:  Follow up 83 year old female with known history of coronary artery disease, chronic systolic heart failure and chronic atrial fibrillation who presented with worsening  shortness of breath, weight gain and fatigue. She tested positive for the flu and was found to be in congestive heart failure. She is currently getting Tamiflu and IV furosemide. She reports some improvement in symptoms. EXAM: CHEST  2 VIEW COMPARISON:  10/10/2016 and older exams. FINDINGS: Stable enlargement of cardiac silhouette. No mediastinal or hilar masses. Irregularly thickened interstitial markings are noted bilaterally, also stable. There is lung base opacity consistent with combination of pleural effusions and atelectasis. No new lung abnormalities. No pneumothorax. Skeletal structures are demineralized. There is an old compression fracture at the thoracolumbar junction previously treated with vertebroplasty. IMPRESSION: 1. No significant change from the most recent prior exam. 2. Cardiomegaly with bilateral interstitial thickening, bilateral pleural effusions and associated lung base atelectasis. Suspect chronic congestive heart failure. No convincing pneumonia. Electronically Signed   By: Lajean Manes M.D.   On: 10/12/2016 08:42    Cardiac Studies   Echo 01/2016: Study Conclusions  - Left ventricle: The cavity size was normal. Systolic function was moderately to severely reduced. The estimated ejection fraction was in the range of 30% to 35%. Hypokinesis of the anterior myocardium. Hypokinesis of the anteroseptal myocardium. Left ventricular diastolic function parameters were normal. - Aortic root: The aortic root was mildly dilated, 3.4 cm, ascending aorta mildly dilated 3.5 cm - Mitral valve: There was mild regurgitation. - Left atrium: The atrium was mildly dilated. - Right atrium: The atrium was mildly dilated. - Tricuspid valve: There was mild-moderate regurgitation. - Pulmonary arteries: PA peak pressure: 33 mm Hg (S).  Patient Profile     83 y.o. female with history of CAD, ICM, Chronic systolic CHF, HTN, HL, chronic AF on coumadin, and PAH, who was admitted 1/28  with Flu and CHF.  Assessment & Plan    1. Acute respiratory distress with hypoxia: -Likely multifactorial in the setting of influenza A and acute on chronic systolic CHF -On room air  2. ICM/acute on chronic systolic CHF: -Diuresing well as above -Received IV Lasix this AM, change to PO on 2/1 -Hold lasix this PM 2/2 AKI -SOB persists along with crackles and elevated JVD -Not on ACEi/ARB/spiro/Entresto 2/2 relative hypotension with history of orthostasis requiring midodrine  3. Influenza A: -Tamiflu per IM  4. Chronic Afib: -Ventricular rate remains well controlled -Continue beta blocker (as BP allows) and Coumadin -CHADS2VASc at least 5 (CHF, age x 2, vascular disease, female)  5. CAD/elevated troponin: -H/o MI and  DES x 2 in 2004 @ [place]. Neg MV in 2016. No recent c/p. Minimal trop elevation (0.03) with flat trend. This likely represents demand ischemia in the setting of flu and CHF. Cont med Rx w/ ?blocker (as BP allows, has been held 2/2 hypotension) and statin. No asa in setting of chronic coumadin  6. Thrombocytopenia: -Stable  7. Anemia: -Stable -Improved  8. AKI: -Already received IV Lasix this AM -Hold IV lasix -Back on PO Lasix 20 mg daily to prn on 2/1  Signed, Marcille Blanco Morning [place] Pager: [phone] 10/13/2016, 7:44 AM   Attending Note Patient seen and examined, agree with detailed note above,  Patient presentation and plan discussed on rounds.   For the second day and a row I told the patient that she tested positive for the flu. Both days she has acted surprised, told her that no one told her that she had the flu Continued thick cough, on room air, otherwise appears comfortable Eating well, assisted her with making her oatmeal with creamer Appears to have good appetite Has not been out of bed very much  On clinical exam coarse breath sounds throughout, heart sounds regular with no murmurs appreciated, no JVD, abdomen thin  soft nontender, no significant lower extremity edema  Lab work reviewed showing climb in BUN and creatinine concerning for hypovolemia CBC stable  --- ----Acute respiratory distress Predominantly secondary to influenza A Unable to exclude small component of chronic systolic CHF She has tolerated Lasix to her hospital course, would hold diuretics at this time given climb in BUN and creatinine Still with thick cough again consistent with resolving viral URI -Consider changing Lasix to 20 minute grams every other day. At home she was only taking Lasix as needed  -----Failure to thrive Very debilitated, legs are weak, needs skilled nursing I'm concerned she will not do well at home without full-time care Also concerned she is not making her decisions on her back half.  Possible dementia, told her she has flu every day and she is not remembering High likelihood of falls at home, readmission if unable to take care of herself  Greater than 50% was spent in counseling and coordination of care with patient Total encounter time 25 minutes or more   Signed: Esmond Plants  M.D., Ph.D. [facility]

## 2016-10-13 NOTE — Telephone Encounter (Signed)
I did talk with hospitalist this morning on rounds I feel she is stable but very weak Will likely not be ready to go to [facility] unless she does really well with physical therapy I suspect she will need skilled nursing for rehabilitation and then would be safe enough to go back to [facility]

## 2016-10-13 NOTE — Care Management (Signed)
Physical therapy has recommended skilled nursing placement.  Patient does not want to go to skilled nursing. Patient says that she has a caregiver that comes in twice a day- 2 hours in the morning then before bed.  She has a walker.  Patient's meals are taken to her apartment.  Spoke with patient's daughter who is at home sick.  Discussed that anticipate discharge within next 24 hours.  Ms [name] is concerned how she will get patient home due to the cold weather.  Agreeable to home health and agency preference is [facility].  Will contact agency to determine staff availability

## 2016-10-13 NOTE — Telephone Encounter (Signed)
Spoke with patients daughter per release form. She wanted to call and see if the patient could be kept longer due to her having the flu. Let her know that she should call and discuss this with her mother's nurse or the case manager. Had lengthy discussion about patients health and treatment. Again instructed her to contact the nurse or case manager as they would be able to give her a better idea as to when she may be ready for discharge. She verbalized understanding of our conversation and had no further questions at this time.

## 2016-10-13 NOTE — Progress Notes (Signed)
[place] [facility] [place] NAME: [name]    MR#:  [id number]  DATE OF BIRTH:  01-12-1922  SUBJECTIVE:  CHIEF COMPLAINT:   Chief Complaint  Patient presents with  . Shortness of Breath   - feels better, still has coarse cough - weak with therapy, refused rehab  REVIEW OF SYSTEMS:  Review of Systems  Constitutional: Negative for chills, fever and malaise/fatigue.  HENT: Positive for hearing loss. Negative for congestion, ear discharge and nosebleeds.   Eyes: Negative for blurred vision and double vision.  Respiratory: Positive for cough and sputum production. Negative for shortness of breath and wheezing.   Cardiovascular: Negative for chest pain, palpitations and leg swelling.  Gastrointestinal: Negative for abdominal pain, constipation, diarrhea, nausea and vomiting.  Genitourinary: Negative for dysuria.  Musculoskeletal: Negative for myalgias.  Neurological: Positive for weakness. Negative for dizziness, sensory change, speech change, focal weakness, seizures and headaches.  Psychiatric/Behavioral: Negative for depression.    DRUG ALLERGIES:   Allergies  Allergen Reactions  . Sulfonamide Derivatives Nausea And Vomiting    REACTION: sick  . Alendronate Sodium Other (See Comments)    REACTION: GI    VITALS:  Blood pressure 112/62, pulse 76, temperature 97.9 F (36.6 C), temperature source Oral, resp. rate 18, height 5\' 7"  (1.702 m), weight 61.6 kg (135 lb 12.8 oz), SpO2 94 %.  PHYSICAL EXAMINATION:  Physical Exam  GENERAL:  83 y.o.-year-old elderly patient lying in the bed with no acute distress.  EYES: Pupils equal, round, reactive to light and accommodation. No scleral icterus. Extraocular muscles intact.  HEENT: Head atraumatic, normocephalic. Oropharynx and nasopharynx clear.  NECK:  Supple, no jugular venous distention. No thyroid enlargement, no tenderness.  LUNGS: Improved breath sounds bilaterally, no wheezing,  rales\ or crepitation. Scattered rhonchi heard. No use of accessory muscles of respiration.  CARDIOVASCULAR: S1, S2 normal. No rubs, or gallops. 3/6 systolic murmur present. ABDOMEN: Soft, nontender, nondistended. Bowel sounds present. No organomegaly or mass.  EXTREMITIES: No cyanosis, or clubbing. No pedal edema today. NEUROLOGIC: Cranial nerves II through XII are intact. Muscle strength 5/5 in all extremities. Sensation intact. Gait not checked. Global weakness noted. PSYCHIATRIC: The patient is alert and oriented x 3.  SKIN: No obvious rash, lesion, or ulcer.    LABORATORY PANEL:   CBC  Recent Labs Lab 10/13/16 0542  WBC 5.1  HGB 12.5  HCT 36.6  PLT 110*   ------------------------------------------------------------------------------------------------------------------  Chemistries   Recent Labs Lab 10/10/16 1115  10/13/16 0542  NA 142  < > 140  K 4.2  < > 3.7  CL 110  < > 103  CO2 24  < > 28  GLUCOSE 118*  < > 92  BUN 32*  < > 30*  CREATININE 1.27*  < > 1.33*  CALCIUM 9.4  < > 8.3*  MG  --   --  1.7  AST 33  --   --   ALT 14  --   --   ALKPHOS 59  --   --   BILITOT 1.6*  --   --   < > = values in this interval not displayed. ------------------------------------------------------------------------------------------------------------------  Cardiac Enzymes  Recent Labs Lab 10/10/16 2251  TROPONINI 0.03*   ------------------------------------------------------------------------------------------------------------------  RADIOLOGY:  Dg Chest 2 View  Result Date: 10/12/2016 CLINICAL DATA:  Follow up 83 year old female with known history of coronary artery disease, chronic systolic heart failure and chronic atrial fibrillation who presented with worsening shortness of  breath, weight gain and fatigue. She tested positive for the flu and was found to be in congestive heart failure. She is currently getting Tamiflu and IV furosemide. She reports some improvement  in symptoms. EXAM: CHEST  2 VIEW COMPARISON:  10/10/2016 and older exams. FINDINGS: Stable enlargement of cardiac silhouette. No mediastinal or hilar masses. Irregularly thickened interstitial markings are noted bilaterally, also stable. There is lung base opacity consistent with combination of pleural effusions and atelectasis. No new lung abnormalities. No pneumothorax. Skeletal structures are demineralized. There is an old compression fracture at the thoracolumbar junction previously treated with vertebroplasty. IMPRESSION: 1. No significant change from the most recent prior exam. 2. Cardiomegaly with bilateral interstitial thickening, bilateral pleural effusions and associated lung base atelectasis. Suspect chronic congestive heart failure. No convincing pneumonia. Electronically Signed   By: Lajean Manes M.D.   On: 10/12/2016 08:42    EKG:   Orders placed or performed in visit on 08/26/16  . EKG 12-Lead    ASSESSMENT AND PLAN:   83 year old female with past medical history significant for CAD, ischemic cardiomyopathy last known EF of 30-35%, chronic atrial fibrillation on Coumadin, COPD not on home oxygen, hypertension, until branch block abnormality, osteoporosis admitted from assisted living facility secondary to worsening shortness of breath and hypoxia.  #1 acute on chronic systolic CHF exacerbation-monitor on telemetry. -was on IV Lasix - change to oral from tomorrow. Held today due to slight elevated creatinine. on room air. -Appreciate cardiology consult - On low-dose metoprolol. Lisinopril has been discontinued as outpatient due to relative hypotension - monitor closely, net negative by 5L today since admission  #2 influenza A illness-no evidence of any bronchitis. But complains of productive cough. -Continue Tamiflu for now. Follow up chest x-ray with similar findings. Cough meds added  #3 hypertension-blood pressure is low normal. Hold metoprolol if needed. On midodrine for  history of orthostasis  #4 chronic atrial fibrillation-metoprolol if blood pressure is able to tolerate. Continue Coumadin, INR is therapeutic.  #5 CKD stage III-creatinine stable around 1.2. Avoid nephrotoxins. Monitor carefully while on Lasix.  #6 thrombocytopenia-new diagnosis. Closely monitor. No active bleeding and no indication for transfusion at this time Improving actually. Likely the acute illness worsened the counts.  #7 DVT prophylaxis-on Coumadin  #8 Hypokalemia- replaced. secondary to being on lasix- potassium supplements ordered by cardiology team   Physical therapy consulted. Recommended rehab- but family interested in going back to [place] with home health and caregivers   All the records are reviewed and case discussed with Care Management/Social Workerr. Management plans discussed with the patient, family and they are in agreement.  CODE STATUS: DNR  TOTAL TIME TAKING CARE OF THIS PATIENT: 36 minutes.   POSSIBLE D/C TOMORROW, DEPENDING ON CLINICAL CONDITION.   Gladstone Lighter M.D on 10/13/2016 at 3:34 PM  Between 7am to 6pm - Pager - [phone]  After 6pm go to [email or web address] - password [email or web address] [facility]  Office  [phone]  CC: Primary care physician; Loura Pardon, MD

## 2016-10-13 NOTE — Care Management (Signed)
patient again states she wants to go back to [facility] and not go to a skilled nursing facility.  Discussed with her that anticipate discharge home tomorrow and daughter says she has arranged for patient's in home caregiver to transport patient home.

## 2016-10-13 NOTE — Telephone Encounter (Signed)
Spoke with patients daughter with Dr. Donivan Scull information and she stated that her mother would not want to do the rehab but that [facility] has been in touch with her. Let her know again that they would be the one to assist with her mothers discharge planning. She verbalized understanding and was so appreciative for the call back.

## 2016-10-14 LAB — BASIC METABOLIC PANEL
Anion gap: 8 (ref 5–15)
BUN: 30 mg/dL — AB (ref 6–20)
CHLORIDE: 103 mmol/L (ref 101–111)
CO2: 29 mmol/L (ref 22–32)
Calcium: 8.5 mg/dL — ABNORMAL LOW (ref 8.9–10.3)
Creatinine, Ser: 1.18 mg/dL — ABNORMAL HIGH (ref 0.44–1.00)
GFR calc Af Amer: 45 mL/min — ABNORMAL LOW (ref >60)
GFR calc non Af Amer: 39 mL/min — ABNORMAL LOW (ref >60)
Glucose, Bld: 93 mg/dL (ref 65–99)
POTASSIUM: 4.3 mmol/L (ref 3.5–5.1)
SODIUM: 140 mmol/L (ref 135–145)

## 2016-10-14 LAB — PROTIME-INR
INR: 2.38
Prothrombin Time: 26.4 seconds — ABNORMAL HIGH (ref 11.4–15.2)

## 2016-10-14 MED ORDER — OSELTAMIVIR PHOSPHATE 30 MG PO CAPS: 30.0000 mg | ORAL_CAPSULE | Freq: Every day | ORAL | 0 refills | Status: DC

## 2016-10-14 MED ORDER — FUROSEMIDE 20 MG PO TABS: 20.0000 mg | ORAL_TABLET | ORAL | 1 refills | Status: DC

## 2016-10-14 NOTE — Progress Notes (Signed)
Called pt daughter, Windy Kalata and went over her discharge paperwork with her.

## 2016-10-14 NOTE — Progress Notes (Signed)
Pt discharged to home via wc.  Instructions  given to caregiver.  Questions answered.  No distress.

## 2016-10-14 NOTE — Discharge Summary (Signed)
[facility] NAME: [name]    MR#:  [id number]  DATE OF BIRTH:  [birth year]  DATE OF ADMISSION:  10/10/2016 ADMITTING PHYSICIAN: [name], MD  DATE OF DISCHARGE: 10/14/2016  PRIMARY CARE PHYSICIAN: Loura Pardon, MD    ADMISSION DIAGNOSIS:  SOB (shortness of breath) [R06.02] Influenza A [J10.1] Congestive heart failure, unspecified congestive heart failure chronicity, unspecified congestive heart failure type ([place]) [I50.9]  DISCHARGE DIAGNOSIS:  Influenza A Acute on Chronic systolic CHF-improved SECONDARY DIAGNOSIS:   Past Medical History:  Diagnosis Date  . Allergy   . Anemia   . Arthritis    osteo  . CAD (coronary artery disease)    a. 2 DES, [facility], 2004; b. nuc 2013 septal dyssenergy 2/2 LBBB, nl LV fxn;  c. 06/2015 MV: EF 41%, septal HK (LBBB), no ischemia->Low risk.  . Chronic atrial fibrillation (HCC)    a. on Coumadin (CHA2DS2VASc = 6); b. Holter 06/2010, no brady, mild tachy.  . Chronic systolic CHF (congestive heart failure) ([place])    a. 03/2015 Echo: EF 30-35%;  b. 01/2016 Echo: EF 30-35%.  . Compression fracture of lumbar vertebra ([place]) 05-2008  . COPD (chronic obstructive pulmonary disease) ([place])   . Diverticulosis of colon   . Dyslipidemia   . Essential hypertension   . Family history of adverse reaction to anesthesia    daughter PONV  . GERD (gastroesophageal reflux disease)   . Hard of hearing   . Hematemesis    a. 03/2015.  [name] Hx of colonoscopy   . Hypothyroid   . Ischemic cardiomyopathy    a. 03/2015 Echo: EF 30-35%, ant, antsept HK, mod MR, mildly dil LA/RV, mod dil RA, mod-sev TR, PASP 38mmHg;  b. 01/2016 Echo: EF 30-35%, ant/antsept HK, mildly dil Ao root (3.4cm) and Asc Ao (3.5cm), mild [email or web address] bi-atrial enlargement, mild to mod TR. PASP 64mmHg.  [name] LBBB (left bundle branch block)   . Lung abnormality    Dr [name] 122/2011 no further w/u needed  . Mitral regurgitation   . Myocardial  infarction 2016   "mild heart attack"  . Nausea    Some nausea with medications, May, 2013,  . Nocturia   . OA (osteoarthritis)   . Orthostatic hypotension    June, 2014  . Osteoporosis   . Pleural effusion associated with pulmonary infection 03-2007  . Prolonged QT interval    when on sotolol in past  . Pulmonary hypertension    a. 03/2015 PASP 12mmHg by echo.  . Shingles 2014  . Thrombocytopenia ([place])   . TR (tricuspid regurgitation)    moderate, echo 2008  . Urinary frequency   . Vitamin B deficiency   . Warfarin anticoagulation   . Wears dentures   . Wears glasses   . Weight loss    August, 2012    HOSPITAL COURSE:   83 year old female with past medical history significant for CAD, ischemic cardiomyopathy last known EF of 30-35%, chronic atrial fibrillation on Coumadin, COPD not on home oxygen, hypertension, until branch block abnormality, osteoporosis admitted from assisted living facility secondary to worsening shortness of breath and hypoxia.  #1 acute on chronic systolic CHF exacerbation-monitor on telemetry. -was on IV Lasix - changed to oral lasix -sats stable on room air. -Appreciate cardiology consult - On low-dose metoprolol. Lisinopril has been discontinued as outpatient due to relative hypotension - monitor closely, net negative by 5L today since admission  #2 influenza A illness-no evidence  of any bronchitis. But complains of productive cough. -Continue Tamiflu for now. Follow up chest x-ray with similar findings. Cough meds added  #3 hypertension-blood pressure is low normal. Hold metoprolol if needed. On midodrine for history of orthostasis  #4 chronic atrial fibrillation-metoprolol if blood pressure is able to tolerate. Continue Coumadin, INR is therapeutic.  #5 CKD stage III-creatinine stable around 1.2. Avoid nephrotoxins. Monitor carefully while on Lasix.  #6 thrombocytopenia-new diagnosis. Closely monitor. No active bleeding and no  indication for transfusion at this time Improving actually. Likely the acute illness worsened the counts.  #7 DVT prophylaxis-on Coumadin  #8 Hypokalemia- replaced. secondary to being on lasix- potassium supplements ordered by cardiology team   Physical therapy consulted. Recommended rehab- but family interested in going back to [place] with home health and caregivers  D/c today  CONSULTS OBTAINED:  Treatment Team:  Minna Merritts, MD Wellington Hampshire, MD Deboraha Sprang, MD  DRUG ALLERGIES:   Allergies  Allergen Reactions  . Sulfonamide Derivatives Nausea And Vomiting    REACTION: sick  . Alendronate Sodium Other (See Comments)    REACTION: GI    DISCHARGE MEDICATIONS:   Current Discharge Medication List    START taking these medications   Details  benzonatate (TESSALON) 100 MG capsule Take 1 capsule (100 mg total) by mouth 3 (three) times daily. Qty: 20 capsule, Refills: 0    guaiFENesin (MUCINEX) 600 MG 12 hr tablet Take 2 tablets (1,200 mg total) by mouth 2 (two) times daily. Qty: 30 tablet, Refills: 0    oseltamivir (TAMIFLU) 30 MG capsule Take 1 capsule (30 mg total) by mouth daily. X 1 more day Qty: 2 capsule, Refills: 0      CONTINUE these medications which have CHANGED   Details  furosemide (LASIX) 20 MG tablet Take 1 tablet (20 mg total) by mouth every other day. Qty: 90 tablet, Refills: 1      CONTINUE these medications which have NOT CHANGED   Details  calcium carbonate (OS-CAL) 600 MG TABS tablet Take 1,200 mg by mouth every evening.     docusate sodium (COLACE) 100 MG capsule Take 100 mg by mouth daily.    Glucosamine HCl 500 MG TABS Take 500 mg by mouth daily.     hypromellose (GENTEAL) 0.3 % GEL ophthalmic ointment Place into both eyes.    levothyroxine (SYNTHROID, LEVOTHROID) 88 MCG tablet TAKE 1 TABLET BY MOUTH DAILY Qty: 90 tablet, Refills: 0    metoprolol tartrate (LOPRESSOR) 25 MG tablet Take 0.5 tablets (12.5 mg total) by  mouth 2 (two) times daily. Qty: 180 tablet, Refills: 3    midodrine (PROAMATINE) 5 MG tablet TAKE ONE TABLET BY MOUTH TWICE DAILY WITH A MEAL Qty: 180 tablet, Refills: 3    mirtazapine (REMERON) 15 MG tablet Take 1 tablet (15 mg total) by mouth at bedtime. Qty: 90 tablet, Refills: 3    Multiple Vitamin (MULTIVITAMIN) tablet Take 1 tablet by mouth daily.     multivitamin-lutein (OCUVITE-LUTEIN) CAPS capsule Take 1 capsule by mouth daily.    pantoprazole (PROTONIX) 40 MG tablet TAKE 1 TABLET BY MOUTH DAILY Qty: 90 tablet, Refills: 1    simvastatin (ZOCOR) 20 MG tablet TAKE 1/2 TABLET  (10MG   TOTAL) EVERY EVENING Qty: 45 tablet, Refills: 1    !! warfarin (COUMADIN) 5 MG tablet Take 5 mg by mouth daily. Sunday and friday    !! warfarin (COUMADIN) 7.5 MG tablet Take 7.5 mg by mouth daily. Monday, Tuesday, Wednesday, Thursday,  Saturday    fluticasone (FLONASE) 50 MCG/ACT nasal spray 2 SPRAYS IN BOTH NOSTRILS DAILY Qty: 16 g, Refills: 5     !! - Potential duplicate medications found. Please discuss with provider.      If you experience worsening of your admission symptoms, develop shortness of breath, life threatening emergency, suicidal or homicidal thoughts you must seek medical attention immediately by calling 911 or calling your MD immediately  if symptoms less severe.  You Must read complete instructions/literature along with all the possible adverse reactions/side effects for all the Medicines you take and that have been prescribed to you. Take any new Medicines after you have completely understood and accept all the possible adverse reactions/side effects.   Please note  You were cared for by a hospitalist during your hospital stay. If you have any questions about your discharge medications or the care you received while you were in the hospital after you are discharged, you can call the unit and asked to speak with the hospitalist on call if the hospitalist that took care of you  is not available. Once you are discharged, your primary care physician will handle any further medical issues. Please note that NO REFILLS for any discharge medications will be authorized once you are discharged, as it is imperative that you return to your primary care physician (or establish a relationship with a primary care physician if you do not have one) for your aftercare needs so that they can reassess your need for medications and monitor your lab values. Today   SUBJECTIVE   Doing well  VITAL SIGNS:  Blood pressure 119/60, pulse 76, temperature 98.4 F (36.9 C), temperature source Oral, resp. rate 15, height 5\' 7"  (1.702 m), weight 61.6 kg (135 lb 12.8 oz), SpO2 96 %.  I/O:    Intake/Output Summary (Last 24 hours) at 10/14/16 [id number] Last data filed at 10/13/16 2232  Gross per 24 hour  Intake              360 ml  Output              650 ml  Net             -290 ml    PHYSICAL EXAMINATION:  GENERAL:  83 y.o.-year-old patient lying in the bed with no acute distress.  EYES: Pupils equal, round, reactive to light and accommodation. No scleral icterus. Extraocular muscles intact.  HEENT: Head atraumatic, normocephalic. Oropharynx and nasopharynx clear.  NECK:  Supple, no jugular venous distention. No thyroid enlargement, no tenderness.  LUNGS: Normal breath sounds bilaterally, no wheezing, rales,rhonchi or crepitation. No use of accessory muscles of respiration.  CARDIOVASCULAR: S1, S2 normal. No murmurs, rubs, or gallops.  ABDOMEN: Soft, non-tender, non-distended. Bowel sounds present. No organomegaly or mass.  EXTREMITIES: No pedal edema, cyanosis, or clubbing.  NEUROLOGIC: Cranial nerves II through XII are intact. Muscle strength 5/5 in all extremities. Sensation intact. Gait not checked.  PSYCHIATRIC: The patient is alert and oriented x 3.  SKIN: No obvious rash, lesion, or ulcer.   DATA REVIEW:   CBC   Recent Labs Lab 10/13/16 0542  WBC 5.1  HGB 12.5  HCT 36.6  PLT  110*    Chemistries   Recent Labs Lab 10/10/16 1115  10/13/16 0542 10/14/16 0539  NA 142  < > 140 140  K 4.2  < > 3.7 4.3  CL 110  < > 103 103  CO2 24  < > 28 29  GLUCOSE  118*  < > 92 93  BUN 32*  < > 30* 30*  CREATININE 1.27*  < > 1.33* 1.18*  CALCIUM 9.4  < > 8.3* 8.5*  MG  --   --  1.7  --   AST 33  --   --   --   ALT 14  --   --   --   ALKPHOS 59  --   --   --   BILITOT 1.6*  --   --   --   < > = values in this interval not displayed.  Microbiology Results   No results found for this or any previous visit (from the past 240 hour(s)).  RADIOLOGY:  No results found.   Management plans discussed with the patient, family and they are in agreement.  CODE STATUS:     Code Status Orders        Start     Ordered   10/10/16 1640  Do not attempt resuscitation (DNR)  Continuous    Question Answer Comment  In the event of cardiac or respiratory ARREST Do not call a "code blue"   In the event of cardiac or respiratory ARREST Do not perform Intubation, CPR, defibrillation or ACLS   In the event of cardiac or respiratory ARREST Use medication by any route, position, wound care, and other measures to relive pain and suffering. May use oxygen, suction and manual treatment of airway obstruction as needed for comfort.      10/10/16 1639    Code Status History    Date Active Date Inactive Code Status Order ID Comments User Context   03/24/2016  7:28 PM 04/02/2016  8:09 PM DNR [id number]  Leeroy Cha, MD Inpatient   03/23/2016  7:01 PM 03/24/2016  7:28 PM Full Code [id number]  Leeroy Cha, MD Inpatient   04/07/2015  7:41 PM 04/12/2015  4:13 PM DNR [id number]  Max Sane, MD Inpatient    Advance Directive Documentation   Flowsheet Row Most Recent Value  Type of Advance Directive  Out of facility DNR (pink MOST or yellow form)  Pre-existing out of facility DNR order (yellow form or pink MOST form)  Yellow form placed in chart (order not valid for inpatient use)  "MOST" Form in  Place?  No data      TOTAL TIME TAKING CARE OF THIS PATIENT: 40 minutes.    Lillyanna Glandon M.D on 10/14/2016 at 8:22 AM  Between 7am to 6pm - Pager - [phone] After 6pm go to [email or web address] - password [email or web address]  Office  [phone]  CC: Primary care physician; Loura Pardon, MD

## 2016-10-14 NOTE — Care Management Important Message (Signed)
Important Message  Patient Details  Name: DEJAHNAE HOLSTER MRN: [id number] Date of Birth: 1922/01/19   Medicare Important Message Given:  Yes  Initial signed IM printed from Epic and given to patient.  forgot to document.  Notice was given 10/12/2016    Katrina Stack, RN 10/14/2016, 3:54 PM

## 2016-10-15 ENCOUNTER — Telehealth: Payer: Self-pay | Admitting: Cardiovascular Disease

## 2016-10-15 ENCOUNTER — Other Ambulatory Visit: Payer: Self-pay

## 2016-10-15 ENCOUNTER — Telehealth: Payer: Self-pay

## 2016-10-15 DIAGNOSIS — I255 Ischemic cardiomyopathy: Secondary | ICD-10-CM | POA: Diagnosis not present

## 2016-10-15 DIAGNOSIS — Z955 Presence of coronary angioplasty implant and graft: Secondary | ICD-10-CM | POA: Diagnosis not present

## 2016-10-15 DIAGNOSIS — I482 Chronic atrial fibrillation: Secondary | ICD-10-CM | POA: Diagnosis not present

## 2016-10-15 DIAGNOSIS — Z87891 Personal history of nicotine dependence: Secondary | ICD-10-CM | POA: Diagnosis not present

## 2016-10-15 DIAGNOSIS — I5043 Acute on chronic combined systolic (congestive) and diastolic (congestive) heart failure: Secondary | ICD-10-CM | POA: Diagnosis not present

## 2016-10-15 DIAGNOSIS — I272 Pulmonary hypertension, unspecified: Secondary | ICD-10-CM | POA: Diagnosis not present

## 2016-10-15 DIAGNOSIS — Z7901 Long term (current) use of anticoagulants: Secondary | ICD-10-CM | POA: Diagnosis not present

## 2016-10-15 DIAGNOSIS — I110 Hypertensive heart disease with heart failure: Secondary | ICD-10-CM | POA: Diagnosis not present

## 2016-10-15 DIAGNOSIS — J449 Chronic obstructive pulmonary disease, unspecified: Secondary | ICD-10-CM | POA: Diagnosis not present

## 2016-10-15 DIAGNOSIS — I2510 Atherosclerotic heart disease of native coronary artery without angina pectoris: Secondary | ICD-10-CM | POA: Diagnosis not present

## 2016-10-15 DIAGNOSIS — E785 Hyperlipidemia, unspecified: Secondary | ICD-10-CM | POA: Diagnosis not present

## 2016-10-15 DIAGNOSIS — I951 Orthostatic hypotension: Secondary | ICD-10-CM | POA: Diagnosis not present

## 2016-10-15 DIAGNOSIS — D696 Thrombocytopenia, unspecified: Secondary | ICD-10-CM | POA: Diagnosis not present

## 2016-10-15 DIAGNOSIS — I872 Venous insufficiency (chronic) (peripheral): Secondary | ICD-10-CM | POA: Diagnosis not present

## 2016-10-15 MED ORDER — POTASSIUM CHLORIDE ER 10 MEQ PO TBCR: 10.0000 meq | EXTENDED_RELEASE_TABLET | ORAL | 3 refills | Status: DC | PRN

## 2016-10-15 NOTE — Telephone Encounter (Signed)
Pt daughter calling stating pt was told to take potassium while being in hospital.  She would like to know if she needs to take it every other day along with her Lasik  Please advise.

## 2016-10-15 NOTE — Telephone Encounter (Signed)
Pt was recently d/c'd from [facility]. She was advised that she would need to take K+ w/ her lasix, but she was not given rx for this. She would like to know how much Dr. Rockey Situ recommends she take & if we can send this in for her.

## 2016-10-15 NOTE — Telephone Encounter (Signed)
Spoke w/ [name].  Advised her of Dr. Donivan Scull recommendation.  She states that she already has some K+ at home, she will take as prescribed and keep upcoming appt w/ Dr. Rockey Situ.

## 2016-10-15 NOTE — Telephone Encounter (Signed)
Stay on 10 mEq day of potassium with Lasix

## 2016-10-15 NOTE — Telephone Encounter (Signed)
Transition Care Management Follow-up Telephone Call    Date discharged? 10/14/2016  How have you been since you were released from the hospital? [name].   Any patient concerns? Outreach call completed with [name], patient's daughter. Ms. [name] is concerned about patient having Lasix causing patient to have low potassium. Patient education provided about high potassium foods and signs of hypokalemia. Ms. [name] verbalized understanding.    Items Reviewed:  Medications reviewed: Yes  Allergies reviewed: Yes  Dietary changes reviewed: Yes  Referrals reviewed: Yes   Functional Questionnaire:  Independent - I Dependent - D    Activities of Daily Living (ADLs):    Personal hygiene - D Dressing - D Eating - I Maintaining continence - I (bedside commode) Transferring - I (walker)  Independent Activities of Daily Living (iADLs): Basic communication skills - I Transportation - D Meal preparation  - D Shopping - D Housework - D Managing medications - D  Managing personal finances - D   Confirmed importance and date/time of follow-up visits scheduled YES  Provider Appointment booked with PCP 10/19/16 @ 1515  Confirmed with patient if condition begins to worsen call PCP or go to the ER.  Patient was given the office number and encouraged to call back with question or concerns: YES

## 2016-10-19 ENCOUNTER — Ambulatory Visit (INDEPENDENT_AMBULATORY_CARE_PROVIDER_SITE_OTHER): Payer: PPO | Admitting: Family Medicine

## 2016-10-19 ENCOUNTER — Encounter: Payer: Self-pay | Admitting: Family Medicine

## 2016-10-19 ENCOUNTER — Ambulatory Visit (INDEPENDENT_AMBULATORY_CARE_PROVIDER_SITE_OTHER): Payer: PPO

## 2016-10-19 VITALS — BP 124/72 | HR 75 | Temp 97.7°F | Ht 67.0 in

## 2016-10-19 DIAGNOSIS — Z7901 Long term (current) use of anticoagulants: Secondary | ICD-10-CM | POA: Diagnosis not present

## 2016-10-19 DIAGNOSIS — I482 Chronic atrial fibrillation, unspecified: Secondary | ICD-10-CM

## 2016-10-19 DIAGNOSIS — I951 Orthostatic hypotension: Secondary | ICD-10-CM

## 2016-10-19 DIAGNOSIS — Z5181 Encounter for therapeutic drug level monitoring: Secondary | ICD-10-CM | POA: Diagnosis not present

## 2016-10-19 DIAGNOSIS — I110 Hypertensive heart disease with heart failure: Secondary | ICD-10-CM | POA: Diagnosis not present

## 2016-10-19 DIAGNOSIS — R600 Localized edema: Secondary | ICD-10-CM

## 2016-10-19 DIAGNOSIS — I872 Venous insufficiency (chronic) (peripheral): Secondary | ICD-10-CM | POA: Diagnosis not present

## 2016-10-19 DIAGNOSIS — I255 Ischemic cardiomyopathy: Secondary | ICD-10-CM | POA: Diagnosis not present

## 2016-10-19 DIAGNOSIS — J101 Influenza due to other identified influenza virus with other respiratory manifestations: Secondary | ICD-10-CM

## 2016-10-19 DIAGNOSIS — N179 Acute kidney failure, unspecified: Secondary | ICD-10-CM | POA: Diagnosis not present

## 2016-10-19 DIAGNOSIS — I10 Essential (primary) hypertension: Secondary | ICD-10-CM

## 2016-10-19 DIAGNOSIS — Z955 Presence of coronary angioplasty implant and graft: Secondary | ICD-10-CM | POA: Diagnosis not present

## 2016-10-19 DIAGNOSIS — E785 Hyperlipidemia, unspecified: Secondary | ICD-10-CM | POA: Diagnosis not present

## 2016-10-19 DIAGNOSIS — I200 Unstable angina: Secondary | ICD-10-CM | POA: Diagnosis not present

## 2016-10-19 DIAGNOSIS — J449 Chronic obstructive pulmonary disease, unspecified: Secondary | ICD-10-CM | POA: Diagnosis not present

## 2016-10-19 DIAGNOSIS — Z87891 Personal history of nicotine dependence: Secondary | ICD-10-CM | POA: Diagnosis not present

## 2016-10-19 DIAGNOSIS — D696 Thrombocytopenia, unspecified: Secondary | ICD-10-CM | POA: Diagnosis not present

## 2016-10-19 DIAGNOSIS — I509 Heart failure, unspecified: Secondary | ICD-10-CM

## 2016-10-19 DIAGNOSIS — I5043 Acute on chronic combined systolic (congestive) and diastolic (congestive) heart failure: Secondary | ICD-10-CM | POA: Diagnosis not present

## 2016-10-19 DIAGNOSIS — I2510 Atherosclerotic heart disease of native coronary artery without angina pectoris: Secondary | ICD-10-CM | POA: Diagnosis not present

## 2016-10-19 DIAGNOSIS — I272 Pulmonary hypertension, unspecified: Secondary | ICD-10-CM | POA: Diagnosis not present

## 2016-10-19 LAB — POCT INR: INR: 2.2

## 2016-10-19 NOTE — Patient Instructions (Signed)
Pre visit review using our clinic review tool, if applicable. No additional management support is needed unless otherwise documented below in the visit note. 

## 2016-10-19 NOTE — Patient Instructions (Signed)
Lab today for kidney/ potassium and platelets  So glad you are feeling better !  Increase your activity as tolerated  See Dr Rockey Situ as planned   Keep me posted

## 2016-10-19 NOTE — Progress Notes (Signed)
Pre visit review using our clinic review tool, if applicable. No additional management support is needed unless otherwise documented below in the visit note. 

## 2016-10-19 NOTE — Progress Notes (Signed)
Subjective:    Patient ID: [name], female    DOB: 02-Feb-1922, 83 y.o.   MRN: [id number]  HPI Here for f/u of hospitalization from 1/28 to 10/14/16 for influenza with sob and exacerbation of CHF   For CHF tx with lasix/ cardiology consult/ transitioned to oral lasix and mt off ace due to hypotension  Influenza- tx with tamiflu and supportive care   HTN - bp was low normal and told to hold metoprolol prn if it is needed  On minodrine for hx of orthostasis  afib remained stable with therapeutic INR   CKD stage 3- cr was stable 1.2   She was newly diag with thrombocytopenia , thought to be caused by acute illness and this started to improve  Lab Results  Component Value Date   PLT 110 (L) 10/13/2016  this inc from 49    K was replaced due to lasix Lab Results  Component Value Date   K 4.3 10/14/2016     She was e/c back to [facility] for [facility]  Getting INR today  2.2   CXR    Study Result   CLINICAL DATA:  Follow up 83 year old female with known history of coronary artery disease, chronic systolic heart failure and chronic atrial fibrillation who presented with worsening shortness of breath, weight gain and fatigue. She tested positive for the flu and was found to be in congestive heart failure. She is currently getting Tamiflu and IV furosemide. She reports some improvement in symptoms.  EXAM: CHEST  2 VIEW  COMPARISON:  10/10/2016 and older exams.  FINDINGS: Stable enlargement of cardiac silhouette. No mediastinal or hilar masses.  Irregularly thickened interstitial markings are noted bilaterally, also stable. There is lung base opacity consistent with combination of pleural effusions and atelectasis. No new lung abnormalities. No pneumothorax.  Skeletal structures are demineralized. There is an old compression fracture at the thoracolumbar junction previously treated with vertebroplasty.  IMPRESSION: 1. No significant  change from the most recent prior exam. 2. Cardiomegaly with bilateral interstitial thickening, bilateral pleural effusions and associated lung base atelectasis. Suspect chronic congestive heart failure. No convincing pneumonia.   Electronically Signed   By: Lajean Manes M.D.   On: 10/12/2016 08:42      Today vitals are re assuring BP: 124/72  Temp: 97.7 F (36.5 C)  Pulse Rate: 75  Pulse ox 98% on RA  She has appt with cardiology on 2/15 Dr Rockey Situ   Diuresed well in the hospital  Leg swelling is much much better  Wt Readings from Last 3 Encounters:  10/10/16 135 lb 12.8 oz (61.6 kg)  08/26/16 128 lb (58.1 kg)  05/28/16 122 lb 2 oz (55.4 kg)   Taking lasix every other day 20 mg   Uses tessalon as needed for cough and also mucinex  Not coughing much  Is short of breath if she bends over  No orthopnea  (she also uses a wedge in bed- more comfortable)   Still feels tired -working with [facility] to get stronger   Patient Active Problem List   Diagnosis Date Noted  . Muscle weakness (generalized)   . Unsteadiness on feet   . Congestive heart failure ([place])   . Influenza A   . Pedal edema 10/08/2016  . Encounter for therapeutic drug monitoring 08/24/2016  . Long-term (current) use of anticoagulants 08/24/2016  . Acute on chronic systolic congestive heart failure ([place])   . Surgery, elective   . Arterial  hypotension   . Adjustment disorder with mixed anxiety and depressed mood 06/09/2015  . Generalized weakness 05/12/2015  . Chronic systolic CHF (congestive heart failure) ([place]) 04/29/2015  . Cardiomyopathy, ischemic   . Acute systolic CHF (congestive heart failure) ([place])   . Pulmonary hypertension   . Adult failure to thrive   . Chronic atrial fibrillation ([place])   . Unstable angina ([place]) 04/07/2015  . Venous stasis dermatitis 06/19/2014  . Chronic diastolic CHF (congestive heart failure) ([place]) 01/04/2014  . Orthostatic hypotension   . Acute on chronic combined  systolic and diastolic heart failure ([place]) 03/02/2013  . Acute renal failure ([place]) 02/28/2013  . History of shingles 02/28/2013  . Nail fungus 01/10/2013  . Hyperlipidemia 07/09/2011  . Hearing loss 07/09/2011  . Weight loss   . A-fib ([place])   . Tachycardia   . CAD (coronary artery disease)   . Mitral regurgitation   . SOB (shortness of breath)   . LBBB (left bundle branch block)   . Warfarin anticoagulation   . Ejection fraction   . TR (tricuspid regurgitation)   . Dyslipidemia   . Lung abnormality   . Prolonged QT interval   . Coccydynia 12/03/2010  . Nonspecific (abnormal) findings on radiological and other examination of body structure 07/13/2010  . ABNORMAL CHEST XRAY 07/13/2010  . Shortness of breath 06/18/2010  . Essential hypertension 06/24/2009  . GOUT, UNSPECIFIED 03/20/2009  . GERD 03/18/2009  . Thrombocytopenia ([place]) 01/03/2009  . ALLERGIC RHINITIS 12/05/2008  . HOARSENESS 06/07/2008  . MEDIASTINAL LYMPHADENOPATHY 05/13/2008  . Other diseases of lung, not elsewhere classified 04/18/2008  . Venous (peripheral) insufficiency 09/13/2007  . FREQUENCY, URINARY 07/19/2007  . TINNITUS NOS 05/11/2007  . OSTEOPOROSIS NOS 05/11/2007  . HYPOTHYROIDISM 04/11/2007  . ANEMIA, B12 DEFICIENCY 04/11/2007  . HYPOXEMIA 04/11/2007   Past Medical History:  Diagnosis Date  . Allergy   . Anemia   . Arthritis    osteo  . CAD (coronary artery disease)    a. 2 DES, [facility], 2004; b. nuc 2013 septal dyssenergy 2/2 LBBB, nl LV fxn;  c. 06/2015 MV: EF 41%, septal HK (LBBB), no ischemia->Low risk.  . Chronic atrial fibrillation (HCC)    a. on Coumadin (CHA2DS2VASc = 6); b. Holter 06/2010, no brady, mild tachy.  . Chronic systolic CHF (congestive heart failure) ([place])    a. 03/2015 Echo: EF 30-35%;  b. 01/2016 Echo: EF 30-35%.  . Compression fracture of lumbar vertebra ([place]) 05-2008  . COPD (chronic obstructive pulmonary disease) ([place])   . Diverticulosis of colon   . Dyslipidemia   .  Essential hypertension   . Family history of adverse reaction to anesthesia    daughter PONV  . GERD (gastroesophageal reflux disease)   . Hard of hearing   . Hematemesis    a. 03/2015.  [name] Hx of colonoscopy   . Hypothyroid   . Ischemic cardiomyopathy    a. 03/2015 Echo: EF 30-35%, ant, antsept HK, mod MR, mildly dil LA/RV, mod dil RA, mod-sev TR, PASP 66mmHg;  b. 01/2016 Echo: EF 30-35%, ant/antsept HK, mildly dil Ao root (3.4cm) and Asc Ao (3.5cm), mild [email or web address] bi-atrial enlargement, mild to mod TR. PASP 27mmHg.  [name] LBBB (left bundle branch block)   . Lung abnormality    Dr [name] 122/2011 no further w/u needed  . Mitral regurgitation   . Myocardial infarction 2016   "mild heart attack"  . Nausea    Some nausea with medications, May, 2013,  . Nocturia   .  OA (osteoarthritis)   . Orthostatic hypotension    June, 2014  . Osteoporosis   . Pleural effusion associated with pulmonary infection 03-2007  . Prolonged QT interval    when on sotolol in past  . Pulmonary hypertension    a. 03/2015 PASP 82mmHg by echo.  . Shingles 2014  . Thrombocytopenia ([place])   . TR (tricuspid regurgitation)    moderate, echo 2008  . Urinary frequency   . Vitamin B deficiency   . Warfarin anticoagulation   . Wears dentures   . Wears glasses   . Weight loss    August, 2012   Past Surgical History:  Procedure Laterality Date  . COLONOSCOPY    . CORONARY ANGIOPLASTY  2004  . LUMBAR LAMINECTOMY/DECOMPRESSION MICRODISCECTOMY N/A 03/23/2016   Procedure: Lumbar three-four Laminectomy/Foraminotomy, posterior laterial arthrodesis;  Surgeon: Leeroy Cha, MD;  Location: [facility] NEURO ORS;  Service: Neurosurgery;  Laterality: N/A;  L3-4 Laminectomy/Foraminotomy  . THYROIDECTOMY  1962  . TONSILLECTOMY     Social History  Substance Use Topics  . Smoking status: Former Smoker    Types: Cigarettes    Quit date: 09/14/1947  . Smokeless tobacco: Never Used  . Alcohol use No   Family History  Problem Relation Age  of Onset  . Heart disease Mother 35  . Stroke Father 51  . Heart disease Sister   . Cancer Brother     colon  . Diabetes Brother   . Leukemia Sister    Allergies  Allergen Reactions  . Sulfonamide Derivatives Nausea And Vomiting    REACTION: sick  . Alendronate Sodium Other (See Comments)    REACTION: GI   Current Outpatient Prescriptions on File Prior to Visit  Medication Sig Dispense Refill  . benzonatate (TESSALON) 100 MG capsule Take 1 capsule (100 mg total) by mouth 3 (three) times daily. 20 capsule 0  . calcium carbonate (OS-CAL) 600 MG TABS tablet Take 1,200 mg by mouth every evening.     . docusate sodium (COLACE) 100 MG capsule Take 100 mg by mouth daily.    . fluticasone (FLONASE) 50 MCG/ACT nasal spray 2 SPRAYS IN BOTH NOSTRILS DAILY 16 g 5  . furosemide (LASIX) 20 MG tablet Take 1 tablet (20 mg total) by mouth every other day. 90 tablet 1  . Glucosamine HCl 500 MG TABS Take 500 mg by mouth daily.     [name] guaiFENesin (MUCINEX) 600 MG 12 hr tablet Take 2 tablets (1,200 mg total) by mouth 2 (two) times daily. 30 tablet 0  . hypromellose (GENTEAL) 0.3 % GEL ophthalmic ointment Place into both eyes.    [name] levothyroxine (SYNTHROID, LEVOTHROID) 88 MCG tablet TAKE 1 TABLET BY MOUTH DAILY 90 tablet 0  . metoprolol tartrate (LOPRESSOR) 25 MG tablet Take 0.5 tablets (12.5 mg total) by mouth 2 (two) times daily. (Patient taking differently: Take 12.5 mg by mouth 2 (two) times daily. HOLD evening dose if BP is under 110) 180 tablet 3  . midodrine (PROAMATINE) 5 MG tablet TAKE ONE TABLET BY MOUTH TWICE DAILY WITH A MEAL 180 tablet 3  . mirtazapine (REMERON) 15 MG tablet Take 1 tablet (15 mg total) by mouth at bedtime. 90 tablet 3  . Multiple Vitamin (MULTIVITAMIN) tablet Take 1 tablet by mouth daily.     . multivitamin-lutein (OCUVITE-LUTEIN) CAPS capsule Take 1 capsule by mouth daily.    . pantoprazole (PROTONIX) 40 MG tablet TAKE 1 TABLET BY MOUTH DAILY 90 tablet 1  . potassium chloride  (K-DUR)  10 MEQ tablet Take 1 tablet (10 mEq total) by mouth as needed (take with lasix). 90 tablet 3  . simvastatin (ZOCOR) 20 MG tablet TAKE 1/2 TABLET  (10MG   TOTAL) EVERY EVENING (Patient taking differently: Take 5 mg by mouth every evening. ) 45 tablet 1  . warfarin (COUMADIN) 5 MG tablet Take 5 mg by mouth daily. Sunday and friday    . warfarin (COUMADIN) 7.5 MG tablet Take 7.5 mg by mouth daily. Monday, Tuesday, Wednesday, Thursday, Saturday     No current facility-administered medications on file prior to visit.     Review of Systems Review of Systems  Constitutional: Negative for fever, appetite change,  and unexpected weight change.  ENT pos for hearing loss  Eyes: Negative for pain and visual disturbance.  Respiratory: Negative for wheeze and shortness of breath.  pos for cough that is much improved  Cardiovascular: Negative for cp or palpitations   neg for pedal edema (resolved)  Gastrointestinal: Negative for nausea, diarrhea and constipation.  Genitourinary: Negative for urgency and frequency.  Skin: Negative for pallor or rash   Neurological: Negative for weakness, light-headedness, numbness and headaches.  Hematological: Negative for adenopathy. Does not bruise/bleed easily.  Psychiatric/Behavioral: Negative for dysphoric mood. The patient is not nervous/anxious.         Objective:   Physical Exam  Constitutional: She appears well-developed and well-nourished. No distress.  Frail appearing elderly female in wheelchair  Very HOH   HENT:  Head: Normocephalic and atraumatic.  Right Ear: External ear normal.  Left Ear: External ear normal.  Mouth/Throat: Oropharynx is clear and moist.  Boggy nares Some clear rhinorrhea  Eyes: Conjunctivae and EOM are normal. Pupils are equal, round, and reactive to light.  Neck: Normal range of motion. Neck supple. No JVD present. Carotid bruit is not present. No thyromegaly present.  Cardiovascular: Normal rate, normal heart sounds  and intact distal pulses.  Exam reveals no gallop.   Pulmonary/Chest: Effort normal and breath sounds normal. No respiratory distress. She has no wheezes. She has no rales.  Mild crackles at low bilat bases   Abdominal: Soft. Bowel sounds are normal. She exhibits no distension, no abdominal bruit and no mass. There is no tenderness.  Musculoskeletal: She exhibits no edema.  No pedal edema today  Lymphadenopathy:    She has no cervical adenopathy.  Neurological: She is alert. She has normal reflexes.  Skin: Skin is warm and dry. No rash noted.  Dermatitis on legs is improving   Psychiatric: She has a normal mood and affect.          Assessment & Plan:   Problem List Items Addressed This Visit      Cardiovascular and Mediastinum   Chronic atrial fibrillation (HCC)    No changes  INR tx today-continues warfarin      Congestive heart failure ([place])    Much improved after diuresis with recent hospitalization  hosp records/labs and studies rev in detail today Watching wt and intake  Pedal edema is resolved  No sob  Some crackles heard at the bases of lungs  For cardiology f/u in about a week      Essential hypertension - Primary    bp in fair control at this time  BP Readings from Last 1 Encounters:  10/19/16 124/72   No changes needed Disc lifstyle change with low sodium diet and exercise   Stable currently off of ace (which may have worsened hypotension)      Relevant  Orders   Basic metabolic panel   Orthostatic hypotension    Staying off ace  Has minodrine ordered by cardiology      Unstable angina ([place])    No recent symptoms - sees cardiology Recent hosp with CHF On statin         Respiratory   Influenza A    S/p hosp and tx with tamiflu/suportive  Now home with home health- getting stronger Much imp clinically and on exam  hosp records rev with pt and family in detail         Genitourinary   Acute renal failure ([place])    This occurred with  diuresis in hospital  Re check renal fxn today Was improved at d/c      Relevant Orders   Basic metabolic panel     Other   Pedal edema    Much improved with diuresis  Thought now to be due to CHF For cardiology f/u in 7 d Continue to watch      Thrombocytopenia ([place])    Suspect due to infection in hospital-was improving at d/c Re check cbc today  No bleeding changes  Also on anticoag with warfarin      Relevant Orders   CBC with Differential/Platelet   Warfarin anticoagulation    Lab Results  Component Value Date   INR 2.2 10/19/2016   INR 2.38 10/14/2016   INR 2.33 10/13/2016

## 2016-10-20 ENCOUNTER — Ambulatory Visit: Payer: PPO | Admitting: Family

## 2016-10-20 DIAGNOSIS — I482 Chronic atrial fibrillation: Secondary | ICD-10-CM | POA: Diagnosis not present

## 2016-10-20 DIAGNOSIS — I951 Orthostatic hypotension: Secondary | ICD-10-CM | POA: Diagnosis not present

## 2016-10-20 DIAGNOSIS — D696 Thrombocytopenia, unspecified: Secondary | ICD-10-CM | POA: Diagnosis not present

## 2016-10-20 DIAGNOSIS — E785 Hyperlipidemia, unspecified: Secondary | ICD-10-CM | POA: Diagnosis not present

## 2016-10-20 DIAGNOSIS — I272 Pulmonary hypertension, unspecified: Secondary | ICD-10-CM | POA: Diagnosis not present

## 2016-10-20 DIAGNOSIS — Z87891 Personal history of nicotine dependence: Secondary | ICD-10-CM | POA: Diagnosis not present

## 2016-10-20 DIAGNOSIS — J449 Chronic obstructive pulmonary disease, unspecified: Secondary | ICD-10-CM | POA: Diagnosis not present

## 2016-10-20 DIAGNOSIS — I5043 Acute on chronic combined systolic (congestive) and diastolic (congestive) heart failure: Secondary | ICD-10-CM | POA: Diagnosis not present

## 2016-10-20 DIAGNOSIS — I255 Ischemic cardiomyopathy: Secondary | ICD-10-CM | POA: Diagnosis not present

## 2016-10-20 DIAGNOSIS — I110 Hypertensive heart disease with heart failure: Secondary | ICD-10-CM | POA: Diagnosis not present

## 2016-10-20 DIAGNOSIS — I2510 Atherosclerotic heart disease of native coronary artery without angina pectoris: Secondary | ICD-10-CM | POA: Diagnosis not present

## 2016-10-20 DIAGNOSIS — Z7901 Long term (current) use of anticoagulants: Secondary | ICD-10-CM | POA: Diagnosis not present

## 2016-10-20 DIAGNOSIS — Z955 Presence of coronary angioplasty implant and graft: Secondary | ICD-10-CM | POA: Diagnosis not present

## 2016-10-20 DIAGNOSIS — I872 Venous insufficiency (chronic) (peripheral): Secondary | ICD-10-CM | POA: Diagnosis not present

## 2016-10-20 LAB — CBC WITH DIFFERENTIAL/PLATELET
BASOS PCT: 1.3 % (ref 0.0–3.0)
Basophils Absolute: 0.1 10*3/uL (ref 0.0–0.1)
Eosinophils Absolute: 0.2 10*3/uL (ref 0.0–0.7)
Eosinophils Relative: 2.7 % (ref 0.0–5.0)
HCT: 38.8 % (ref 36.0–46.0)
Hemoglobin: 12.6 g/dL (ref 12.0–15.0)
LYMPHS ABS: 1.7 10*3/uL (ref 0.7–4.0)
LYMPHS PCT: 29.9 % (ref 12.0–46.0)
MCHC: 32.5 g/dL (ref 30.0–36.0)
MCV: 96.1 fl (ref 78.0–100.0)
MONOS PCT: 14.3 % — AB (ref 3.0–12.0)
Monocytes Absolute: 0.8 10*3/uL (ref 0.1–1.0)
NEUTROS ABS: 3 10*3/uL (ref 1.4–7.7)
NEUTROS PCT: 51.8 % (ref 43.0–77.0)
PLATELETS: 170 10*3/uL (ref 150.0–400.0)
RBC: 4.04 Mil/uL (ref 3.87–5.11)
RDW: 16.2 % — AB (ref 11.5–15.5)
WBC: 5.8 10*3/uL (ref 4.0–10.5)

## 2016-10-20 LAB — BASIC METABOLIC PANEL
BUN: 26 mg/dL — ABNORMAL HIGH (ref 6–23)
CHLORIDE: 105 meq/L (ref 96–112)
CO2: 29 meq/L (ref 19–32)
CREATININE: 1.19 mg/dL (ref 0.40–1.20)
Calcium: 9.1 mg/dL (ref 8.4–10.5)
GFR: 44.98 mL/min — ABNORMAL LOW (ref >60.00)
GLUCOSE: 98 mg/dL (ref 70–99)
Potassium: 4.9 mEq/L (ref 3.5–5.1)
Sodium: 140 mEq/L (ref 135–145)

## 2016-10-20 NOTE — Assessment & Plan Note (Signed)
No changes  INR tx today-continues warfarin

## 2016-10-20 NOTE — Assessment & Plan Note (Signed)
Lab Results  Component Value Date   INR 2.2 10/19/2016   INR 2.38 10/14/2016   INR 2.33 10/13/2016

## 2016-10-20 NOTE — Assessment & Plan Note (Signed)
bp in fair control at this time  BP Readings from Last 1 Encounters:  10/19/16 124/72   No changes needed Disc lifstyle change with low sodium diet and exercise   Stable currently off of ace (which may have worsened hypotension)

## 2016-10-20 NOTE — Assessment & Plan Note (Signed)
Much improved after diuresis with recent hospitalization  hosp records/labs and studies rev in detail today Watching wt and intake  Pedal edema is resolved  No sob  Some crackles heard at the bases of lungs  For cardiology f/u in about a week

## 2016-10-20 NOTE — Assessment & Plan Note (Signed)
This occurred with diuresis in hospital  Re check renal fxn today Was improved at d/c

## 2016-10-20 NOTE — Assessment & Plan Note (Signed)
Much improved with diuresis  Thought now to be due to CHF For cardiology f/u in 7 d Continue to watch

## 2016-10-20 NOTE — Assessment & Plan Note (Signed)
Staying off ace  Has minodrine ordered by cardiology

## 2016-10-20 NOTE — Assessment & Plan Note (Signed)
Suspect due to infection in hospital-was improving at d/c Re check cbc today  No bleeding changes  Also on anticoag with warfarin

## 2016-10-20 NOTE — Assessment & Plan Note (Signed)
No recent symptoms - sees cardiology Recent hosp with CHF On statin

## 2016-10-20 NOTE — Assessment & Plan Note (Signed)
S/p hosp and tx with tamiflu/suportive  Now home with home health- getting stronger Much imp clinically and on exam  hosp records rev with pt and family in detail

## 2016-10-22 ENCOUNTER — Ambulatory Visit: Payer: PPO

## 2016-10-25 DIAGNOSIS — I255 Ischemic cardiomyopathy: Secondary | ICD-10-CM | POA: Diagnosis not present

## 2016-10-25 DIAGNOSIS — E785 Hyperlipidemia, unspecified: Secondary | ICD-10-CM | POA: Diagnosis not present

## 2016-10-25 DIAGNOSIS — Z955 Presence of coronary angioplasty implant and graft: Secondary | ICD-10-CM | POA: Diagnosis not present

## 2016-10-25 DIAGNOSIS — D696 Thrombocytopenia, unspecified: Secondary | ICD-10-CM | POA: Diagnosis not present

## 2016-10-25 DIAGNOSIS — I872 Venous insufficiency (chronic) (peripheral): Secondary | ICD-10-CM | POA: Diagnosis not present

## 2016-10-25 DIAGNOSIS — I482 Chronic atrial fibrillation: Secondary | ICD-10-CM | POA: Diagnosis not present

## 2016-10-25 DIAGNOSIS — I110 Hypertensive heart disease with heart failure: Secondary | ICD-10-CM | POA: Diagnosis not present

## 2016-10-25 DIAGNOSIS — I2510 Atherosclerotic heart disease of native coronary artery without angina pectoris: Secondary | ICD-10-CM | POA: Diagnosis not present

## 2016-10-25 DIAGNOSIS — Z7901 Long term (current) use of anticoagulants: Secondary | ICD-10-CM | POA: Diagnosis not present

## 2016-10-25 DIAGNOSIS — J449 Chronic obstructive pulmonary disease, unspecified: Secondary | ICD-10-CM | POA: Diagnosis not present

## 2016-10-25 DIAGNOSIS — I951 Orthostatic hypotension: Secondary | ICD-10-CM | POA: Diagnosis not present

## 2016-10-25 DIAGNOSIS — I272 Pulmonary hypertension, unspecified: Secondary | ICD-10-CM | POA: Diagnosis not present

## 2016-10-25 DIAGNOSIS — Z87891 Personal history of nicotine dependence: Secondary | ICD-10-CM | POA: Diagnosis not present

## 2016-10-25 DIAGNOSIS — I5043 Acute on chronic combined systolic (congestive) and diastolic (congestive) heart failure: Secondary | ICD-10-CM | POA: Diagnosis not present

## 2016-10-26 ENCOUNTER — Telehealth: Payer: Self-pay

## 2016-10-26 DIAGNOSIS — Z87891 Personal history of nicotine dependence: Secondary | ICD-10-CM | POA: Diagnosis not present

## 2016-10-26 DIAGNOSIS — I951 Orthostatic hypotension: Secondary | ICD-10-CM | POA: Diagnosis not present

## 2016-10-26 DIAGNOSIS — I110 Hypertensive heart disease with heart failure: Secondary | ICD-10-CM | POA: Diagnosis not present

## 2016-10-26 DIAGNOSIS — J449 Chronic obstructive pulmonary disease, unspecified: Secondary | ICD-10-CM | POA: Diagnosis not present

## 2016-10-26 DIAGNOSIS — I872 Venous insufficiency (chronic) (peripheral): Secondary | ICD-10-CM | POA: Diagnosis not present

## 2016-10-26 DIAGNOSIS — I2510 Atherosclerotic heart disease of native coronary artery without angina pectoris: Secondary | ICD-10-CM | POA: Diagnosis not present

## 2016-10-26 DIAGNOSIS — Z7901 Long term (current) use of anticoagulants: Secondary | ICD-10-CM | POA: Diagnosis not present

## 2016-10-26 DIAGNOSIS — E785 Hyperlipidemia, unspecified: Secondary | ICD-10-CM | POA: Diagnosis not present

## 2016-10-26 DIAGNOSIS — I482 Chronic atrial fibrillation: Secondary | ICD-10-CM | POA: Diagnosis not present

## 2016-10-26 DIAGNOSIS — D696 Thrombocytopenia, unspecified: Secondary | ICD-10-CM | POA: Diagnosis not present

## 2016-10-26 DIAGNOSIS — I255 Ischemic cardiomyopathy: Secondary | ICD-10-CM | POA: Diagnosis not present

## 2016-10-26 DIAGNOSIS — I272 Pulmonary hypertension, unspecified: Secondary | ICD-10-CM | POA: Diagnosis not present

## 2016-10-26 DIAGNOSIS — Z955 Presence of coronary angioplasty implant and graft: Secondary | ICD-10-CM | POA: Diagnosis not present

## 2016-10-26 DIAGNOSIS — I5043 Acute on chronic combined systolic (congestive) and diastolic (congestive) heart failure: Secondary | ICD-10-CM | POA: Diagnosis not present

## 2016-10-26 NOTE — Telephone Encounter (Signed)
[name] pt daughter said that [name] with [facility] saw pt this morning and Lattie Haw thinks pt may have UTI; pt hurts down low when urinates. [name] request cb to [facility] nurse with [facility] at [phone] giving Lattie Haw the order to obtain urinalysis for possible UTI.

## 2016-10-26 NOTE — Telephone Encounter (Signed)
Please ok the verbal order for UA -thanks Culture would be great too if they can send one

## 2016-10-26 NOTE — Telephone Encounter (Signed)
Left voicemail requesting [name] to call back and let us know if she's okay with getting UA and Cx on pt

## 2016-10-27 DIAGNOSIS — N390 Urinary tract infection, site not specified: Secondary | ICD-10-CM | POA: Diagnosis not present

## 2016-10-27 NOTE — Telephone Encounter (Signed)
Left voicemail requesting Lattie Haw with [facility] to call me back to let me know if she received my 1st voicemail I left yesterday

## 2016-10-28 ENCOUNTER — Encounter: Payer: PPO | Admitting: Cardiovascular Disease

## 2016-10-29 NOTE — Telephone Encounter (Signed)
We received UA and culture results back and UA was clear and Urine cx was neg. Dr. Glori Bickers told me to let family know. I called pt's daughter and advised her of results. Copy of UA/culture sent for scanning

## 2016-11-01 DIAGNOSIS — I5042 Chronic combined systolic (congestive) and diastolic (congestive) heart failure: Secondary | ICD-10-CM | POA: Diagnosis not present

## 2016-11-01 DIAGNOSIS — Z87891 Personal history of nicotine dependence: Secondary | ICD-10-CM | POA: Diagnosis not present

## 2016-11-01 DIAGNOSIS — I482 Chronic atrial fibrillation: Secondary | ICD-10-CM | POA: Diagnosis not present

## 2016-11-01 DIAGNOSIS — Z955 Presence of coronary angioplasty implant and graft: Secondary | ICD-10-CM | POA: Diagnosis not present

## 2016-11-01 DIAGNOSIS — J449 Chronic obstructive pulmonary disease, unspecified: Secondary | ICD-10-CM | POA: Diagnosis not present

## 2016-11-01 DIAGNOSIS — D696 Thrombocytopenia, unspecified: Secondary | ICD-10-CM | POA: Diagnosis not present

## 2016-11-01 DIAGNOSIS — I2510 Atherosclerotic heart disease of native coronary artery without angina pectoris: Secondary | ICD-10-CM | POA: Diagnosis not present

## 2016-11-01 DIAGNOSIS — Z7901 Long term (current) use of anticoagulants: Secondary | ICD-10-CM | POA: Diagnosis not present

## 2016-11-01 DIAGNOSIS — I110 Hypertensive heart disease with heart failure: Secondary | ICD-10-CM | POA: Diagnosis not present

## 2016-11-01 DIAGNOSIS — I255 Ischemic cardiomyopathy: Secondary | ICD-10-CM | POA: Diagnosis not present

## 2016-11-01 DIAGNOSIS — E785 Hyperlipidemia, unspecified: Secondary | ICD-10-CM | POA: Diagnosis not present

## 2016-11-01 DIAGNOSIS — I872 Venous insufficiency (chronic) (peripheral): Secondary | ICD-10-CM | POA: Diagnosis not present

## 2016-11-01 DIAGNOSIS — I951 Orthostatic hypotension: Secondary | ICD-10-CM | POA: Diagnosis not present

## 2016-11-01 DIAGNOSIS — I272 Pulmonary hypertension, unspecified: Secondary | ICD-10-CM | POA: Diagnosis not present

## 2016-11-02 DIAGNOSIS — I272 Pulmonary hypertension, unspecified: Secondary | ICD-10-CM | POA: Diagnosis not present

## 2016-11-02 DIAGNOSIS — Z87891 Personal history of nicotine dependence: Secondary | ICD-10-CM | POA: Diagnosis not present

## 2016-11-02 DIAGNOSIS — J449 Chronic obstructive pulmonary disease, unspecified: Secondary | ICD-10-CM | POA: Diagnosis not present

## 2016-11-02 DIAGNOSIS — I110 Hypertensive heart disease with heart failure: Secondary | ICD-10-CM | POA: Diagnosis not present

## 2016-11-02 DIAGNOSIS — I482 Chronic atrial fibrillation: Secondary | ICD-10-CM | POA: Diagnosis not present

## 2016-11-02 DIAGNOSIS — D696 Thrombocytopenia, unspecified: Secondary | ICD-10-CM | POA: Diagnosis not present

## 2016-11-02 DIAGNOSIS — Z7901 Long term (current) use of anticoagulants: Secondary | ICD-10-CM | POA: Diagnosis not present

## 2016-11-02 DIAGNOSIS — E785 Hyperlipidemia, unspecified: Secondary | ICD-10-CM | POA: Diagnosis not present

## 2016-11-02 DIAGNOSIS — I255 Ischemic cardiomyopathy: Secondary | ICD-10-CM | POA: Diagnosis not present

## 2016-11-02 DIAGNOSIS — I872 Venous insufficiency (chronic) (peripheral): Secondary | ICD-10-CM | POA: Diagnosis not present

## 2016-11-02 DIAGNOSIS — I2510 Atherosclerotic heart disease of native coronary artery without angina pectoris: Secondary | ICD-10-CM | POA: Diagnosis not present

## 2016-11-02 DIAGNOSIS — Z955 Presence of coronary angioplasty implant and graft: Secondary | ICD-10-CM | POA: Diagnosis not present

## 2016-11-02 DIAGNOSIS — I5043 Acute on chronic combined systolic (congestive) and diastolic (congestive) heart failure: Secondary | ICD-10-CM | POA: Diagnosis not present

## 2016-11-02 DIAGNOSIS — I951 Orthostatic hypotension: Secondary | ICD-10-CM | POA: Diagnosis not present

## 2016-11-03 ENCOUNTER — Ambulatory Visit (INDEPENDENT_AMBULATORY_CARE_PROVIDER_SITE_OTHER): Payer: PPO

## 2016-11-03 DIAGNOSIS — Z5181 Encounter for therapeutic drug level monitoring: Secondary | ICD-10-CM

## 2016-11-03 DIAGNOSIS — Z7901 Long term (current) use of anticoagulants: Secondary | ICD-10-CM

## 2016-11-03 DIAGNOSIS — I482 Chronic atrial fibrillation, unspecified: Secondary | ICD-10-CM

## 2016-11-03 LAB — POCT INR: INR: 2.2

## 2016-11-03 NOTE — Patient Instructions (Signed)
Pre visit review using our clinic review tool, if applicable. No additional management support is needed unless otherwise documented below in the visit note. 

## 2016-11-04 ENCOUNTER — Encounter: Payer: Self-pay | Admitting: Cardiovascular Disease

## 2016-11-04 ENCOUNTER — Ambulatory Visit (INDEPENDENT_AMBULATORY_CARE_PROVIDER_SITE_OTHER): Payer: PPO | Admitting: Cardiovascular Disease

## 2016-11-04 ENCOUNTER — Ambulatory Visit: Payer: PPO | Admitting: Family

## 2016-11-04 VITALS — BP 100/60 | HR 80 | Ht 67.0 in | Wt 123.2 lb

## 2016-11-04 DIAGNOSIS — J101 Influenza due to other identified influenza virus with other respiratory manifestations: Secondary | ICD-10-CM | POA: Diagnosis not present

## 2016-11-04 DIAGNOSIS — R0602 Shortness of breath: Secondary | ICD-10-CM

## 2016-11-04 DIAGNOSIS — I4819 Other persistent atrial fibrillation: Secondary | ICD-10-CM

## 2016-11-04 DIAGNOSIS — I481 Persistent atrial fibrillation: Secondary | ICD-10-CM

## 2016-11-04 DIAGNOSIS — I951 Orthostatic hypotension: Secondary | ICD-10-CM

## 2016-11-04 DIAGNOSIS — I10 Essential (primary) hypertension: Secondary | ICD-10-CM | POA: Diagnosis not present

## 2016-11-04 DIAGNOSIS — I5043 Acute on chronic combined systolic (congestive) and diastolic (congestive) heart failure: Secondary | ICD-10-CM

## 2016-11-04 NOTE — Patient Instructions (Addendum)
Medication Instructions:   No medication changes made  For weight >126, increase the lasix up to every day Decrease lasix back to every other day for weight 122 to 123  Labwork:  No new labs needed  Testing/Procedures:  No further testing at this time   I recommend watching educational videos on topics of interest to you at:       [email or web address]  Enter code: HEARTCARE    Follow-Up: It was a pleasure seeing you in the office today. Please call us if you have new issues that need to be addressed before your next appt.  [phone]  Your physician wants you to follow-up in: 6 months.  You will receive a reminder letter in the mail two months in advance. If you don't receive a letter, please call our office to schedule the follow-up appointment.  If you need a refill on your cardiac medications before your next appointment, please call your pharmacy.

## 2016-11-04 NOTE — Progress Notes (Signed)
Cardiology Office Note  Date:  11/04/2016   ID:  [name], DOB [birth year], MRN [id number]  PCP:  Loura Pardon, MD   Chief Complaint  Patient presents with  . other    Follow up from [facility]; shortness of breath. "doing well."     HPI:  83 y.o. female with h/o coronary artery disease, atrial fibrillation on Coumadin, chronic combined systolic and diastolic CHF, mitral regurgitation, tricuspid regurgitation, prolonged QT, LBBB, carotid disease, HTN, thrombocytopenia, Moderate pulmonary hypertension, ejection fraction 30-35% Prior stress test with no ischemia Previous history of orthostatic hypotension, takes midodrine twice a day with improved symptoms She presents today for follow-up of her acute on chronic systolic and diastolic CHF and pulmonary hypertension  In follow-up today she reports that she is recovering after recent hospitalization Hospital records reviewed hospital admission and of January 2018 for shortness of breath,Flu positive, CHF exacerbation Weight at that time was 135 pounds on admission Discharged 10/14/2016 after diuresis and treatment of flu she was treated with IV Lasix while inpatient Weight now 122 up to 123 pounds She is taking Lasix every other day with potassium every other day Weight has been relatively stable, now watching her salt intake  Still weak, working with physical therapy, nursing  EKG on today's visit shows atrial fibrillation with left bundle branch block, ventricular rate 80 bpm  Other past medical history reviewed Underwent lumbar back surgery over the summer 2017, reports that she did well Pain has improved Limited mobility   presented to the ED on 04/07/15 with complaints of nausea, bloody emesis, burning chest pain that extended into the neck, shortness of breath, and worsening weakness and productive cough.  Echocardiogram at the time showed moderate pulmonary hypertension, significant TR, ejection fraction was reduced  30-35% Outpatient stress test showed no ischemia, anteroseptal perfusion defect consistent with bundle branch block She is currently living at [facility] She does take midodrine in the morning and evening for orthostatic hypotension   Echo 04/07/15 showed normal LV size, EF 30-35% (down from Echo on 02/28/13: EF 50-55%), hypokenesis of the anteroseptal myocardium, MV moderate regurg, mildly dilated left atrium and right ventricle, moderate to severe TR, and PA peak pressure 57 mmHg.   PMH:   has a past medical history of Allergy; Anemia; Arthritis; CAD (coronary artery disease); Chronic atrial fibrillation ([place]); Chronic systolic CHF (congestive heart failure) ([place]); Compression fracture of lumbar vertebra (HCC) (05-2008); COPD (chronic obstructive pulmonary disease) ([place]); Diverticulosis of colon; Dyslipidemia; Essential hypertension; Family history of adverse reaction to anesthesia; GERD (gastroesophageal reflux disease); Hard of hearing; Hematemesis; colonoscopy; Hypothyroid; Ischemic cardiomyopathy; LBBB (left bundle branch block); Lung abnormality; Mitral regurgitation; Myocardial infarction (2016); Nausea; Nocturia; OA (osteoarthritis); Orthostatic hypotension; Osteoporosis; Pleural effusion associated with pulmonary infection (03-2007); Prolonged QT interval; Pulmonary hypertension; Shingles (2014); Thrombocytopenia ([place]); TR (tricuspid regurgitation); Urinary frequency; Vitamin B deficiency; Warfarin anticoagulation; Wears dentures; Wears glasses; and Weight loss.  PSH:    Past Surgical History:  Procedure Laterality Date  . COLONOSCOPY    . CORONARY ANGIOPLASTY  2004  . LUMBAR LAMINECTOMY/DECOMPRESSION MICRODISCECTOMY N/A 03/23/2016   Procedure: Lumbar three-four Laminectomy/Foraminotomy, posterior laterial arthrodesis;  Surgeon: Leeroy Cha, MD;  Location: [facility] NEURO ORS;  Service: Neurosurgery;  Laterality: N/A;  L3-4 Laminectomy/Foraminotomy  . THYROIDECTOMY  1962  . TONSILLECTOMY       Current Outpatient Prescriptions  Medication Sig Dispense Refill  . benzonatate (TESSALON) 100 MG capsule Take 1 capsule (100 mg total) by mouth 3 (three) times daily. 20 capsule 0  .  calcium carbonate (OS-CAL) 600 MG TABS tablet Take 1,200 mg by mouth every evening.     . docusate sodium (COLACE) 100 MG capsule Take 100 mg by mouth daily.    . fluticasone (FLONASE) 50 MCG/ACT nasal spray 2 SPRAYS IN BOTH NOSTRILS DAILY 16 g 5  . furosemide (LASIX) 20 MG tablet Take 1 tablet (20 mg total) by mouth every other day. 90 tablet 1  . Glucosamine HCl 500 MG TABS Take 500 mg by mouth daily.     . hypromellose (GENTEAL) 0.3 % GEL ophthalmic ointment Place into both eyes.    [name] levothyroxine (SYNTHROID, LEVOTHROID) 88 MCG tablet TAKE 1 TABLET BY MOUTH DAILY 90 tablet 0  . metoprolol tartrate (LOPRESSOR) 25 MG tablet Take 0.5 tablets (12.5 mg total) by mouth 2 (two) times daily. (Patient taking differently: Take 12.5 mg by mouth 2 (two) times daily. HOLD evening dose if BP is under 110) 180 tablet 3  . midodrine (PROAMATINE) 5 MG tablet TAKE ONE TABLET BY MOUTH TWICE DAILY WITH A MEAL 180 tablet 3  . mirtazapine (REMERON) 15 MG tablet Take 1 tablet (15 mg total) by mouth at bedtime. 90 tablet 3  . Multiple Vitamin (MULTIVITAMIN) tablet Take 1 tablet by mouth daily.     . multivitamin-lutein (OCUVITE-LUTEIN) CAPS capsule Take 1 capsule by mouth daily.    . pantoprazole (PROTONIX) 40 MG tablet TAKE 1 TABLET BY MOUTH DAILY 90 tablet 1  . potassium chloride (K-DUR) 10 MEQ tablet Take 1 tablet (10 mEq total) by mouth as needed (take with lasix). 90 tablet 3  . simvastatin (ZOCOR) 20 MG tablet TAKE 1/2 TABLET  (10MG   TOTAL) EVERY EVENING (Patient taking differently: Take 5 mg by mouth every evening. ) 45 tablet 1  . warfarin (COUMADIN) 5 MG tablet Take 5 mg by mouth daily. Sunday and friday    . warfarin (COUMADIN) 7.5 MG tablet Take 7.5 mg by mouth daily. Monday, Tuesday, Wednesday, Thursday, Saturday      No current facility-administered medications for this visit.      Allergies:   Sulfonamide derivatives and Alendronate sodium   Social History:  The patient  reports that she quit smoking about 69 years ago. Her smoking use included Cigarettes. She has never used smokeless tobacco. She reports that she does not drink alcohol or use drugs.   Family History:   family history includes Cancer in her brother; Diabetes in her brother; Heart disease in her sister; Heart disease (age of onset: 9) in her mother; Leukemia in her sister; Stroke (age of onset: 24) in her father.    Review of Systems: Review of Systems  Respiratory: Positive for shortness of breath.   Cardiovascular: Negative.   Gastrointestinal: Negative.   Musculoskeletal: Negative.   Neurological: Positive for weakness.  Psychiatric/Behavioral: Negative.   All other systems reviewed and are negative.    PHYSICAL EXAM: VS:  BP 100/60 (BP Location: Left Arm, Patient Position: Sitting, Cuff Size: Normal)   Pulse 80   Ht 5\' 7"  (1.702 m)   Wt 123 lb 4 oz (55.9 kg)   BMI 19.30 kg/m  , BMI Body mass index is 19.3 kg/m. GEN: Thin woman in no acute distress  HEENT: normal  Neck: no JVD, carotid bruits, or masses Cardiac: Irregularly irregular no murmurs, rubs, or gallops,no edema  Respiratory:  clear to auscultation bilaterally, normal work of breathing GI: soft, nontender, nondistended, + BS MS: no deformity or atrophy  Skin: warm and dry, no rash Neuro:  Strength and sensation are intact Psych: euthymic mood, full affect    Recent Labs: 10/08/2016: Pro B Natriuretic peptide (BNP) 868.0 10/10/2016: ALT 14; B Natriuretic Peptide 846.0 10/13/2016: Magnesium 1.7 10/19/2016: BUN 26; Creatinine, Ser 1.19; Hemoglobin 12.6; Platelets 170.0; Potassium 4.9; Sodium 140    Lipid Panel Lab Results  Component Value Date   CHOL 147 05/12/2015   HDL 46.00 05/12/2015   LDLCALC 83 05/12/2015   TRIG 91.0 05/12/2015      Wt  Readings from Last 3 Encounters:  11/04/16 123 lb 4 oz (55.9 kg)  10/10/16 135 lb 12.8 oz (61.6 kg)  08/26/16 128 lb (58.1 kg)       ASSESSMENT AND PLAN:  Hypotension Borderline low blood pressure, on midodrine. Denies any significant symptoms  Persistent atrial fibrillation ([place]) - Plan: EKG 12-Lead Currently on anticoagulation, rate relatively well-controlled  Acute on chronic combined systolic and diastolic heart failure ([place]) - Plan: EKG 12-Lead Recent hospitalization for 10 pound weight gain Recommended she take Lasix 20 g every other day with potassium. For weight 126 pounds or more recommended she take Lasix daily with potassium daily Recommended follow-up in CHF clinic in several weeks' time Was scheduled today but patient was concerned that insurance would not cover as she had advanced home coming to her house. That is scheduled to end in the next several days  SOB (shortness of breath) - Plan: EKG 12-Lead Mild shortness of breath, improved after 10 pound weight loss with diuresis  Influenza A Recent hospitalization for influenza Still week, slowly recovering, occasional cough  Total encounter time more than 25 minutes  Greater than 50% was spent in counseling and coordination of care with the patient   Disposition:   F/U  6 months   Orders Placed This Encounter  Procedures  . EKG 12-Lead     Signed, Esmond Plants, M.D., Ph.D. 11/04/2016  [place], [place]

## 2016-11-08 ENCOUNTER — Encounter: Payer: Self-pay | Admitting: Family Medicine

## 2016-11-08 DIAGNOSIS — J449 Chronic obstructive pulmonary disease, unspecified: Secondary | ICD-10-CM | POA: Diagnosis not present

## 2016-11-08 DIAGNOSIS — I110 Hypertensive heart disease with heart failure: Secondary | ICD-10-CM | POA: Diagnosis not present

## 2016-11-08 DIAGNOSIS — I482 Chronic atrial fibrillation: Secondary | ICD-10-CM | POA: Diagnosis not present

## 2016-11-08 DIAGNOSIS — I872 Venous insufficiency (chronic) (peripheral): Secondary | ICD-10-CM | POA: Diagnosis not present

## 2016-11-08 DIAGNOSIS — Z7901 Long term (current) use of anticoagulants: Secondary | ICD-10-CM | POA: Diagnosis not present

## 2016-11-08 DIAGNOSIS — I951 Orthostatic hypotension: Secondary | ICD-10-CM | POA: Diagnosis not present

## 2016-11-08 DIAGNOSIS — E785 Hyperlipidemia, unspecified: Secondary | ICD-10-CM | POA: Diagnosis not present

## 2016-11-08 DIAGNOSIS — Z955 Presence of coronary angioplasty implant and graft: Secondary | ICD-10-CM | POA: Diagnosis not present

## 2016-11-08 DIAGNOSIS — I255 Ischemic cardiomyopathy: Secondary | ICD-10-CM | POA: Diagnosis not present

## 2016-11-08 DIAGNOSIS — I5043 Acute on chronic combined systolic (congestive) and diastolic (congestive) heart failure: Secondary | ICD-10-CM | POA: Diagnosis not present

## 2016-11-08 DIAGNOSIS — Z87891 Personal history of nicotine dependence: Secondary | ICD-10-CM | POA: Diagnosis not present

## 2016-11-08 DIAGNOSIS — I2510 Atherosclerotic heart disease of native coronary artery without angina pectoris: Secondary | ICD-10-CM | POA: Diagnosis not present

## 2016-11-08 DIAGNOSIS — I272 Pulmonary hypertension, unspecified: Secondary | ICD-10-CM | POA: Diagnosis not present

## 2016-11-08 DIAGNOSIS — D696 Thrombocytopenia, unspecified: Secondary | ICD-10-CM | POA: Diagnosis not present

## 2016-11-09 DIAGNOSIS — I2510 Atherosclerotic heart disease of native coronary artery without angina pectoris: Secondary | ICD-10-CM | POA: Diagnosis not present

## 2016-11-09 DIAGNOSIS — I482 Chronic atrial fibrillation: Secondary | ICD-10-CM | POA: Diagnosis not present

## 2016-11-09 DIAGNOSIS — Z955 Presence of coronary angioplasty implant and graft: Secondary | ICD-10-CM | POA: Diagnosis not present

## 2016-11-09 DIAGNOSIS — J449 Chronic obstructive pulmonary disease, unspecified: Secondary | ICD-10-CM | POA: Diagnosis not present

## 2016-11-09 DIAGNOSIS — Z87891 Personal history of nicotine dependence: Secondary | ICD-10-CM | POA: Diagnosis not present

## 2016-11-09 DIAGNOSIS — I872 Venous insufficiency (chronic) (peripheral): Secondary | ICD-10-CM | POA: Diagnosis not present

## 2016-11-09 DIAGNOSIS — Z7901 Long term (current) use of anticoagulants: Secondary | ICD-10-CM | POA: Diagnosis not present

## 2016-11-09 DIAGNOSIS — D696 Thrombocytopenia, unspecified: Secondary | ICD-10-CM | POA: Diagnosis not present

## 2016-11-09 DIAGNOSIS — I951 Orthostatic hypotension: Secondary | ICD-10-CM | POA: Diagnosis not present

## 2016-11-09 DIAGNOSIS — I272 Pulmonary hypertension, unspecified: Secondary | ICD-10-CM | POA: Diagnosis not present

## 2016-11-09 DIAGNOSIS — E785 Hyperlipidemia, unspecified: Secondary | ICD-10-CM | POA: Diagnosis not present

## 2016-11-09 DIAGNOSIS — I255 Ischemic cardiomyopathy: Secondary | ICD-10-CM | POA: Diagnosis not present

## 2016-11-09 DIAGNOSIS — I110 Hypertensive heart disease with heart failure: Secondary | ICD-10-CM | POA: Diagnosis not present

## 2016-11-09 DIAGNOSIS — I5043 Acute on chronic combined systolic (congestive) and diastolic (congestive) heart failure: Secondary | ICD-10-CM | POA: Diagnosis not present

## 2016-11-16 ENCOUNTER — Telehealth: Payer: Self-pay | Admitting: Family Medicine

## 2016-11-16 NOTE — Telephone Encounter (Signed)
LVM for pt to call back and schedule AWV + labs with Lesia and CPE with PCP. °

## 2016-11-18 DIAGNOSIS — E785 Hyperlipidemia, unspecified: Secondary | ICD-10-CM | POA: Diagnosis not present

## 2016-11-18 DIAGNOSIS — I482 Chronic atrial fibrillation: Secondary | ICD-10-CM | POA: Diagnosis not present

## 2016-11-18 DIAGNOSIS — I110 Hypertensive heart disease with heart failure: Secondary | ICD-10-CM | POA: Diagnosis not present

## 2016-11-18 DIAGNOSIS — I272 Pulmonary hypertension, unspecified: Secondary | ICD-10-CM | POA: Diagnosis not present

## 2016-11-18 DIAGNOSIS — I2510 Atherosclerotic heart disease of native coronary artery without angina pectoris: Secondary | ICD-10-CM | POA: Diagnosis not present

## 2016-11-18 DIAGNOSIS — I872 Venous insufficiency (chronic) (peripheral): Secondary | ICD-10-CM | POA: Diagnosis not present

## 2016-11-18 DIAGNOSIS — Z87891 Personal history of nicotine dependence: Secondary | ICD-10-CM | POA: Diagnosis not present

## 2016-11-18 DIAGNOSIS — I5043 Acute on chronic combined systolic (congestive) and diastolic (congestive) heart failure: Secondary | ICD-10-CM | POA: Diagnosis not present

## 2016-11-18 DIAGNOSIS — Z955 Presence of coronary angioplasty implant and graft: Secondary | ICD-10-CM | POA: Diagnosis not present

## 2016-11-18 DIAGNOSIS — I951 Orthostatic hypotension: Secondary | ICD-10-CM | POA: Diagnosis not present

## 2016-11-18 DIAGNOSIS — J449 Chronic obstructive pulmonary disease, unspecified: Secondary | ICD-10-CM | POA: Diagnosis not present

## 2016-11-18 DIAGNOSIS — D696 Thrombocytopenia, unspecified: Secondary | ICD-10-CM | POA: Diagnosis not present

## 2016-11-18 DIAGNOSIS — Z7901 Long term (current) use of anticoagulants: Secondary | ICD-10-CM | POA: Diagnosis not present

## 2016-11-18 DIAGNOSIS — I255 Ischemic cardiomyopathy: Secondary | ICD-10-CM | POA: Diagnosis not present

## 2016-11-25 DIAGNOSIS — I2510 Atherosclerotic heart disease of native coronary artery without angina pectoris: Secondary | ICD-10-CM | POA: Diagnosis not present

## 2016-11-25 DIAGNOSIS — I872 Venous insufficiency (chronic) (peripheral): Secondary | ICD-10-CM | POA: Diagnosis not present

## 2016-11-25 DIAGNOSIS — D696 Thrombocytopenia, unspecified: Secondary | ICD-10-CM | POA: Diagnosis not present

## 2016-11-25 DIAGNOSIS — J449 Chronic obstructive pulmonary disease, unspecified: Secondary | ICD-10-CM | POA: Diagnosis not present

## 2016-11-25 DIAGNOSIS — E785 Hyperlipidemia, unspecified: Secondary | ICD-10-CM | POA: Diagnosis not present

## 2016-11-25 DIAGNOSIS — Z955 Presence of coronary angioplasty implant and graft: Secondary | ICD-10-CM | POA: Diagnosis not present

## 2016-11-25 DIAGNOSIS — I110 Hypertensive heart disease with heart failure: Secondary | ICD-10-CM | POA: Diagnosis not present

## 2016-11-25 DIAGNOSIS — I951 Orthostatic hypotension: Secondary | ICD-10-CM | POA: Diagnosis not present

## 2016-11-25 DIAGNOSIS — I5043 Acute on chronic combined systolic (congestive) and diastolic (congestive) heart failure: Secondary | ICD-10-CM | POA: Diagnosis not present

## 2016-11-25 DIAGNOSIS — Z7901 Long term (current) use of anticoagulants: Secondary | ICD-10-CM | POA: Diagnosis not present

## 2016-11-25 DIAGNOSIS — I482 Chronic atrial fibrillation: Secondary | ICD-10-CM | POA: Diagnosis not present

## 2016-11-25 DIAGNOSIS — Z87891 Personal history of nicotine dependence: Secondary | ICD-10-CM | POA: Diagnosis not present

## 2016-11-25 DIAGNOSIS — I255 Ischemic cardiomyopathy: Secondary | ICD-10-CM | POA: Diagnosis not present

## 2016-11-25 DIAGNOSIS — I272 Pulmonary hypertension, unspecified: Secondary | ICD-10-CM | POA: Diagnosis not present

## 2016-11-26 ENCOUNTER — Other Ambulatory Visit: Payer: Self-pay | Admitting: Family Medicine

## 2016-11-30 ENCOUNTER — Ambulatory Visit: Payer: PPO | Attending: Family | Admitting: Family

## 2016-11-30 ENCOUNTER — Encounter: Payer: Self-pay | Admitting: Family

## 2016-11-30 VITALS — BP 112/57 | HR 83 | Resp 18 | Ht 67.0 in | Wt 125.5 lb

## 2016-11-30 DIAGNOSIS — Z833 Family history of diabetes mellitus: Secondary | ICD-10-CM | POA: Diagnosis not present

## 2016-11-30 DIAGNOSIS — Z888 Allergy status to other drugs, medicaments and biological substances status: Secondary | ICD-10-CM | POA: Insufficient documentation

## 2016-11-30 DIAGNOSIS — M1990 Unspecified osteoarthritis, unspecified site: Secondary | ICD-10-CM | POA: Insufficient documentation

## 2016-11-30 DIAGNOSIS — Z7901 Long term (current) use of anticoagulants: Secondary | ICD-10-CM | POA: Diagnosis not present

## 2016-11-30 DIAGNOSIS — Z823 Family history of stroke: Secondary | ICD-10-CM | POA: Insufficient documentation

## 2016-11-30 DIAGNOSIS — I110 Hypertensive heart disease with heart failure: Secondary | ICD-10-CM | POA: Insufficient documentation

## 2016-11-30 DIAGNOSIS — Z806 Family history of leukemia: Secondary | ICD-10-CM | POA: Insufficient documentation

## 2016-11-30 DIAGNOSIS — I2510 Atherosclerotic heart disease of native coronary artery without angina pectoris: Secondary | ICD-10-CM | POA: Diagnosis not present

## 2016-11-30 DIAGNOSIS — E538 Deficiency of other specified B group vitamins: Secondary | ICD-10-CM | POA: Diagnosis not present

## 2016-11-30 DIAGNOSIS — E039 Hypothyroidism, unspecified: Secondary | ICD-10-CM | POA: Diagnosis not present

## 2016-11-30 DIAGNOSIS — I255 Ischemic cardiomyopathy: Secondary | ICD-10-CM | POA: Diagnosis not present

## 2016-11-30 DIAGNOSIS — I5022 Chronic systolic (congestive) heart failure: Secondary | ICD-10-CM | POA: Diagnosis not present

## 2016-11-30 DIAGNOSIS — Z7951 Long term (current) use of inhaled steroids: Secondary | ICD-10-CM | POA: Diagnosis not present

## 2016-11-30 DIAGNOSIS — K219 Gastro-esophageal reflux disease without esophagitis: Secondary | ICD-10-CM | POA: Diagnosis not present

## 2016-11-30 DIAGNOSIS — Z79899 Other long term (current) drug therapy: Secondary | ICD-10-CM | POA: Insufficient documentation

## 2016-11-30 DIAGNOSIS — I447 Left bundle-branch block, unspecified: Secondary | ICD-10-CM | POA: Insufficient documentation

## 2016-11-30 DIAGNOSIS — I482 Chronic atrial fibrillation: Secondary | ICD-10-CM | POA: Insufficient documentation

## 2016-11-30 DIAGNOSIS — I4819 Other persistent atrial fibrillation: Secondary | ICD-10-CM

## 2016-11-30 DIAGNOSIS — D649 Anemia, unspecified: Secondary | ICD-10-CM | POA: Insufficient documentation

## 2016-11-30 DIAGNOSIS — J449 Chronic obstructive pulmonary disease, unspecified: Secondary | ICD-10-CM | POA: Diagnosis not present

## 2016-11-30 DIAGNOSIS — Z87891 Personal history of nicotine dependence: Secondary | ICD-10-CM | POA: Diagnosis not present

## 2016-11-30 DIAGNOSIS — M810 Age-related osteoporosis without current pathological fracture: Secondary | ICD-10-CM | POA: Insufficient documentation

## 2016-11-30 DIAGNOSIS — I509 Heart failure, unspecified: Secondary | ICD-10-CM | POA: Diagnosis not present

## 2016-11-30 DIAGNOSIS — I10 Essential (primary) hypertension: Secondary | ICD-10-CM

## 2016-11-30 DIAGNOSIS — Z955 Presence of coronary angioplasty implant and graft: Secondary | ICD-10-CM | POA: Insufficient documentation

## 2016-11-30 DIAGNOSIS — I272 Pulmonary hypertension, unspecified: Secondary | ICD-10-CM | POA: Insufficient documentation

## 2016-11-30 DIAGNOSIS — Z8249 Family history of ischemic heart disease and other diseases of the circulatory system: Secondary | ICD-10-CM | POA: Insufficient documentation

## 2016-11-30 DIAGNOSIS — I252 Old myocardial infarction: Secondary | ICD-10-CM | POA: Diagnosis not present

## 2016-11-30 DIAGNOSIS — Z882 Allergy status to sulfonamides status: Secondary | ICD-10-CM | POA: Insufficient documentation

## 2016-11-30 DIAGNOSIS — E785 Hyperlipidemia, unspecified: Secondary | ICD-10-CM | POA: Diagnosis not present

## 2016-11-30 DIAGNOSIS — Z800 Family history of malignant neoplasm of digestive organs: Secondary | ICD-10-CM | POA: Insufficient documentation

## 2016-11-30 NOTE — Patient Instructions (Signed)
Continue weighing daily and call for an overnight weight gain of > 2 pounds or a weekly weight gain of >5 pounds. 

## 2016-11-30 NOTE — Progress Notes (Signed)
Branch ID: [name], female    DOB: [birth year], 83 y.o.   MRN: [id number]  HPI  [name] is a 83 y/o female with a history of vitamin B deficiency, pulmonary HTN, osteoarthritis, MI, LBBB, hypothyroidism, GERD, COPD, atrial fibrillation, anemia, dyslipidemia, remote tobacco use and chronic heart failure.   Last echo was done 02/02/16 and showed an EF of 30-35% along with mild MR and mild/mod TR. PA pressure was 33 mm Hg. EF is [name] same as 2016 but valve leakage has dramatically improved along with improved of right side heart pressure.   Admitted 10/10/16 with HF exacerbation along with influenza A. Initially needed IV diuretics. Cardiology consult was obtained. Lost 5 L during admission. Treated with tamiflu. Discharged back to assisted living facility.   [name] presents today for her initial visit with fatigue and shortness of breath with moderate exertion. Symptoms improve fairly quickly upon rest. Does notice palpitations. Has back pain when standing for long periods of time. Feels like [name]'s sleeping well. Denies any swelling in her legs/abdomen. Does walk to dining room at [name] with her walker.   Past Medical History:  Diagnosis Date  . Allergy   . Anemia   . Arthritis    osteo  . CAD (coronary artery disease)    a. 2 DES, [facility], 2004; b. nuc 2013 septal dyssenergy 2/2 LBBB, nl LV fxn;  c. 06/2015 MV: EF 41%, septal HK (LBBB), no ischemia->Low risk.  . Chronic atrial fibrillation (HCC)    a. on Coumadin (CHA2DS2VASc = 6); b. Holter 06/2010, no brady, mild tachy.  . Chronic systolic CHF (congestive heart failure) ([place])    a. 03/2015 Echo: EF 30-35%;  b. 01/2016 Echo: EF 30-35%.  . Compression fracture of lumbar vertebra ([place]) 05-2008  . COPD (chronic obstructive pulmonary disease) ([place])   . Diverticulosis of colon   . Dyslipidemia   . Essential hypertension   . Family history of adverse reaction to anesthesia    daughter PONV  . GERD (gastroesophageal  reflux disease)   . Hard of hearing   . Hematemesis    a. 03/2015.  [name] Hx of colonoscopy   . Hypothyroid   . Ischemic cardiomyopathy    a. 03/2015 Echo: EF 30-35%, ant, antsept HK, mod MR, mildly dil LA/RV, mod dil RA, mod-sev TR, PASP 80mmHg;  b. 01/2016 Echo: EF 30-35%, ant/antsept HK, mildly dil Ao root (3.4cm) and Asc Ao (3.5cm), mild [email or web address] bi-atrial enlargement, mild to mod TR. PASP 63mmHg.  [name] LBBB (left bundle branch block)   . Lung abnormality    Dr [name] 122/2011 no further w/u needed  . Mitral regurgitation   . Myocardial infarction 2016   "mild heart attack"  . Nausea    Some nausea with medications, May, 2013,  . Nocturia   . OA (osteoarthritis)   . Orthostatic hypotension    June, 2014  . Osteoporosis   . Pleural effusion associated with pulmonary infection 03-2007  . Prolonged QT interval    when on sotolol in past  . Pulmonary hypertension    a. 03/2015 PASP 70mmHg by echo.  . Shingles 2014  . Thrombocytopenia ([place])   . TR (tricuspid regurgitation)    moderate, echo 2008  . Urinary frequency   . Vitamin B deficiency   . Warfarin anticoagulation   . Wears dentures   . Wears glasses   . Weight loss    August, 2012   Past Surgical History:  Procedure Laterality  Date  . COLONOSCOPY    . CORONARY ANGIOPLASTY  2004  . LUMBAR LAMINECTOMY/DECOMPRESSION MICRODISCECTOMY N/A 03/23/2016   Procedure: Lumbar three-four Laminectomy/Foraminotomy, posterior laterial arthrodesis;  Surgeon: Leeroy Cha, MD;  Location: [facility] NEURO ORS;  Service: Neurosurgery;  Laterality: N/A;  L3-4 Laminectomy/Foraminotomy  . THYROIDECTOMY  1962  . TONSILLECTOMY     Family History  Problem Relation Age of Onset  . Heart disease Mother 79  . Stroke Father 80  . Heart disease Sister   . Cancer Brother     colon  . Diabetes Brother   . Leukemia Sister    Social History  Substance Use Topics  . Smoking status: Former Smoker    Types: Cigarettes    Quit date: 09/14/1947  . Smokeless  tobacco: Never Used  . Alcohol use No   Allergies  Allergen Reactions  . Sulfonamide Derivatives Nausea And Vomiting    REACTION: sick  . Alendronate Sodium Other (See Comments)    REACTION: GI   Prior to Admission medications   Medication Sig Start Date End Date Taking? Authorizing Provider  calcium carbonate (OS-CAL) 600 MG TABS tablet Take 1,200 mg by mouth every evening.    Yes Historical Provider, MD  docusate sodium (COLACE) 100 MG capsule Take 100 mg by mouth daily.   Yes Historical Provider, MD  fluticasone (FLONASE) 50 MCG/ACT nasal spray 2 SPRAYS IN BOTH NOSTRILS DAILY 05/28/16  Yes Abner Greenspan, MD  furosemide (LASIX) 20 MG tablet Take 1 tablet (20 mg total) by mouth every other day. Branch taking differently: Take 20 mg by mouth every other day. If weight 126 or greater take lasix daily until weight is down to 123. 10/14/16  Yes Fritzi Mandes, MD  Glucosamine HCl 500 MG TABS Take 500 mg by mouth daily.    Yes Historical Provider, MD  levothyroxine (SYNTHROID, LEVOTHROID) 88 MCG tablet TAKE ONE TABLET BY MOUTH EVERY DAY 11/29/16  Yes Abner Greenspan, MD  metoprolol tartrate (LOPRESSOR) 25 MG tablet Take 0.5 tablets (12.5 mg total) by mouth 2 (two) times daily. Branch taking differently: Take 12.5 mg by mouth 2 (two) times daily. HOLD evening dose if BP is under 110 05/11/16  Yes Minna Merritts, MD  midodrine (PROAMATINE) 5 MG tablet TAKE ONE TABLET BY MOUTH TWICE DAILY WITH A MEAL 01/16/16  Yes Minna Merritts, MD  mirtazapine (REMERON) 15 MG tablet TAKE 1 TABLET BY MOUTH AT BEDTIME 11/29/16  Yes Abner Greenspan, MD  Multiple Vitamin (MULTIVITAMIN) tablet Take 1 tablet by mouth daily.    Yes Historical Provider, MD  multivitamin-lutein (OCUVITE-LUTEIN) CAPS capsule Take 1 capsule by mouth daily.   Yes Historical Provider, MD  pantoprazole (PROTONIX) 40 MG tablet TAKE ONE TABLET BY MOUTH EVERY DAY 11/29/16  Yes Abner Greenspan, MD  Polyethyl Glycol-Propyl Glycol (SYSTANE OP) Apply 1 drop to  eye daily.   Yes Historical Provider, MD  potassium chloride (K-DUR) 10 MEQ tablet Take 1 tablet (10 mEq total) by mouth as needed (take with lasix). 10/15/16 01/13/17 Yes Minna Merritts, MD  simvastatin (ZOCOR) 20 MG tablet TAKE 1/2 TABLET  (10MG   TOTAL) EVERY EVENING Branch taking differently: Take 5 mg by mouth every evening.  10/24/15  Yes Abner Greenspan, MD  warfarin (COUMADIN) 5 MG tablet Take 5 mg by mouth daily. Sunday and friday   Yes Historical Provider, MD  warfarin (COUMADIN) 7.5 MG tablet Take 7.5 mg by mouth daily. Monday, Tuesday, Wednesday, Thursday, Saturday   Yes  Historical Provider, MD   Review of Systems  Constitutional: Positive for fatigue. Negative for appetite change.  HENT: Negative for congestion, postnasal drip and sore throat.   Eyes: Negative.   Respiratory: Positive for cough (intermittently) and shortness of breath. Negative for chest tightness.   Cardiovascular: Positive for palpitations. Negative for chest pain and leg swelling.  Gastrointestinal: Negative for abdominal distention and abdominal pain.  Endocrine: Negative.   Genitourinary: Negative.   Musculoskeletal: Positive for back pain (with standing for long periods of time). Negative for neck pain.  Skin: Negative.   Allergic/Immunologic: Negative.   Neurological: Negative for dizziness and light-headedness.  Hematological: Negative for adenopathy. Bruises/bleeds easily.  Psychiatric/Behavioral: Negative for dysphoric mood, sleep disturbance (sleeping on 1 pillow and wedge) and suicidal ideas. [name] is not nervous/anxious.    Vitals:   11/30/16 1105  BP: (!) 112/57  Pulse: 83  Resp: 18  SpO2: 100%  Weight: 125 lb 8 oz (56.9 kg)  Height: 5\' 7"  (1.702 m)   Wt Readings from Last 3 Encounters:  11/30/16 125 lb 8 oz (56.9 kg)  11/04/16 123 lb 4 oz (55.9 kg)  10/10/16 135 lb 12.8 oz (61.6 kg)   Lab Results  Component Value Date   CREATININE 1.19 10/19/2016   CREATININE 1.18 (H) 10/14/2016    CREATININE 1.33 (H) 10/13/2016   Physical Exam  Constitutional: [name] is oriented to person, place, and time. [name] appears well-developed and well-nourished.  HENT:  Head: Normocephalic and atraumatic.  Eyes: Conjunctivae are normal. Pupils are equal, round, and reactive to light.  Neck: Normal range of motion. Neck supple. No JVD present.  Cardiovascular: Normal rate.  An irregular rhythm present.  Pulmonary/Chest: Effort normal. [name] has no wheezes. [name] has no rales.  Abdominal: Soft. [name] exhibits no distension. There is no tenderness.  Musculoskeletal: [name] exhibits no edema or tenderness.  Neurological: [name] is alert and oriented to person, place, and time.  Skin: Skin is warm and dry.  Psychiatric: [name] has a normal mood and affect. Her behavior is normal. Thought content normal.  Nursing note and vitals reviewed.   Assessment & Plan:  1: Chronic heart failure with reduced ejection fraction- - NYHA class II - euvolemic - already weighing daily. Instructed to call for an overnight weight gain of >2 pounds or a weekly weight gain of >5 pounds - not adding salt and tries to choose low sodium items at [facility] where [name] lives - is quite active walking with her walker; does have an aide for bathing assistance and medication administration - saw cardiologist Rockey Situ) [facility]/15/16 [facility] Pharm[facility] reviewed medications with [name] and her daughter - discussed entresto use at next office visit if BP allows  2: HTN- - BP looks good today - takes midodrine twice daily - saw PCP ([facility]) 10/19/16  3: Atrial fibrillation- - currently rate controlled on metoprolol and warfarin  Medication list was reviewed.  Return here in 1 month or sooner for any questions/problems before then.

## 2016-12-03 ENCOUNTER — Ambulatory Visit (INDEPENDENT_AMBULATORY_CARE_PROVIDER_SITE_OTHER): Payer: PPO

## 2016-12-03 DIAGNOSIS — Z5181 Encounter for therapeutic drug level monitoring: Secondary | ICD-10-CM

## 2016-12-03 DIAGNOSIS — I482 Chronic atrial fibrillation, unspecified: Secondary | ICD-10-CM

## 2016-12-03 DIAGNOSIS — Z7901 Long term (current) use of anticoagulants: Secondary | ICD-10-CM

## 2016-12-03 LAB — POCT INR: INR: 2.6

## 2016-12-03 NOTE — Patient Instructions (Signed)
Pre visit review using our clinic review tool, if applicable. No additional management support is needed unless otherwise documented below in the visit note. 

## 2016-12-22 ENCOUNTER — Telehealth: Payer: Self-pay

## 2016-12-22 NOTE — Telephone Encounter (Signed)
[name] left v/m that pt has several bites on legs by bed bugs. Pt is presently at [facility] and is going to be moved to a different room. Are there any possible health issues for pt that [name] should be concerned about. [name] request cb.

## 2016-12-22 NOTE — Telephone Encounter (Signed)
I don't think so - unless she gets an infected bite (one that gets painful or red or swollen) Watch out for that but I have not seen it happen before  Make sure linens are washed in hot water

## 2016-12-22 NOTE — Telephone Encounter (Signed)
Left voicemail letting pt's daughter [name] know Dr. Marliss Coots comments

## 2016-12-29 ENCOUNTER — Other Ambulatory Visit: Payer: Self-pay | Admitting: Family Medicine

## 2016-12-30 ENCOUNTER — Encounter: Payer: Self-pay | Admitting: Family

## 2016-12-30 ENCOUNTER — Ambulatory Visit: Payer: PPO | Attending: Family | Admitting: Family

## 2016-12-30 VITALS — BP 110/60 | HR 81 | Resp 20 | Ht 67.0 in | Wt 123.4 lb

## 2016-12-30 DIAGNOSIS — I110 Hypertensive heart disease with heart failure: Secondary | ICD-10-CM | POA: Diagnosis not present

## 2016-12-30 DIAGNOSIS — J449 Chronic obstructive pulmonary disease, unspecified: Secondary | ICD-10-CM | POA: Diagnosis not present

## 2016-12-30 DIAGNOSIS — I5022 Chronic systolic (congestive) heart failure: Secondary | ICD-10-CM | POA: Insufficient documentation

## 2016-12-30 DIAGNOSIS — Z882 Allergy status to sulfonamides status: Secondary | ICD-10-CM | POA: Insufficient documentation

## 2016-12-30 DIAGNOSIS — I255 Ischemic cardiomyopathy: Secondary | ICD-10-CM | POA: Diagnosis not present

## 2016-12-30 DIAGNOSIS — Z833 Family history of diabetes mellitus: Secondary | ICD-10-CM | POA: Diagnosis not present

## 2016-12-30 DIAGNOSIS — M810 Age-related osteoporosis without current pathological fracture: Secondary | ICD-10-CM | POA: Diagnosis not present

## 2016-12-30 DIAGNOSIS — Z823 Family history of stroke: Secondary | ICD-10-CM | POA: Diagnosis not present

## 2016-12-30 DIAGNOSIS — M1990 Unspecified osteoarthritis, unspecified site: Secondary | ICD-10-CM | POA: Diagnosis not present

## 2016-12-30 DIAGNOSIS — Z87891 Personal history of nicotine dependence: Secondary | ICD-10-CM | POA: Insufficient documentation

## 2016-12-30 DIAGNOSIS — E785 Hyperlipidemia, unspecified: Secondary | ICD-10-CM | POA: Insufficient documentation

## 2016-12-30 DIAGNOSIS — Z955 Presence of coronary angioplasty implant and graft: Secondary | ICD-10-CM | POA: Insufficient documentation

## 2016-12-30 DIAGNOSIS — Z7901 Long term (current) use of anticoagulants: Secondary | ICD-10-CM | POA: Diagnosis not present

## 2016-12-30 DIAGNOSIS — Z8249 Family history of ischemic heart disease and other diseases of the circulatory system: Secondary | ICD-10-CM | POA: Insufficient documentation

## 2016-12-30 DIAGNOSIS — K219 Gastro-esophageal reflux disease without esophagitis: Secondary | ICD-10-CM | POA: Insufficient documentation

## 2016-12-30 DIAGNOSIS — E039 Hypothyroidism, unspecified: Secondary | ICD-10-CM | POA: Insufficient documentation

## 2016-12-30 DIAGNOSIS — I2510 Atherosclerotic heart disease of native coronary artery without angina pectoris: Secondary | ICD-10-CM | POA: Diagnosis not present

## 2016-12-30 DIAGNOSIS — Z800 Family history of malignant neoplasm of digestive organs: Secondary | ICD-10-CM | POA: Diagnosis not present

## 2016-12-30 DIAGNOSIS — Z806 Family history of leukemia: Secondary | ICD-10-CM | POA: Insufficient documentation

## 2016-12-30 DIAGNOSIS — I272 Pulmonary hypertension, unspecified: Secondary | ICD-10-CM | POA: Diagnosis not present

## 2016-12-30 DIAGNOSIS — I252 Old myocardial infarction: Secondary | ICD-10-CM | POA: Diagnosis not present

## 2016-12-30 DIAGNOSIS — I10 Essential (primary) hypertension: Secondary | ICD-10-CM

## 2016-12-30 DIAGNOSIS — E539 Vitamin B deficiency, unspecified: Secondary | ICD-10-CM | POA: Diagnosis not present

## 2016-12-30 DIAGNOSIS — Z79899 Other long term (current) drug therapy: Secondary | ICD-10-CM | POA: Insufficient documentation

## 2016-12-30 DIAGNOSIS — Z7951 Long term (current) use of inhaled steroids: Secondary | ICD-10-CM | POA: Diagnosis not present

## 2016-12-30 DIAGNOSIS — Z888 Allergy status to other drugs, medicaments and biological substances status: Secondary | ICD-10-CM | POA: Insufficient documentation

## 2016-12-30 DIAGNOSIS — I482 Chronic atrial fibrillation: Secondary | ICD-10-CM | POA: Insufficient documentation

## 2016-12-30 DIAGNOSIS — I447 Left bundle-branch block, unspecified: Secondary | ICD-10-CM | POA: Insufficient documentation

## 2016-12-30 NOTE — Progress Notes (Signed)
Patient ID: [name], female    DOB: [birth year], 83 y.o.   MRN: [id number]  HPI  [name] is a 83 y/o female with a history of vitamin B deficiency, pulmonary HTN, osteoarthritis, MI, LBBB, hypothyroidism, GERD, COPD, atrial fibrillation, anemia, dyslipidemia, remote tobacco use and chronic heart failure.   Last echo was done 02/02/16 and showed an EF of 30-35% along with mild MR and mild/mod TR. PA pressure was 33 mm Hg. EF is the same as 2016 but valve leakage has dramatically improved along with improved of right side heart pressure.   Admitted 10/10/16 with HF exacerbation along with influenza A. Initially needed IV diuretics. Cardiology consult was obtained. Lost 5 L during admission. Treated with tamiflu. Discharged back to assisted living facility.   She presents today for her follow-up visit with a chief complaint of mild shortness of breath with moderate exertion. She describes it as chronic in nature and has been occurring for years. Doesn't feel like it's getting any worse. Has associated fatigue, palpitations and weakness along with this.   Past Medical History:  Diagnosis Date  . Allergy   . Anemia   . Arthritis    osteo  . CAD (coronary artery disease)    a. 2 DES, [facility], 2004; b. nuc 2013 septal dyssenergy 2/2 LBBB, nl LV fxn;  c. 06/2015 MV: EF 41%, septal HK (LBBB), no ischemia->Low risk.  . Chronic atrial fibrillation (HCC)    a. on Coumadin (CHA2DS2VASc = 6); b. Holter 06/2010, no brady, mild tachy.  . Chronic systolic CHF (congestive heart failure) ([place])    a. 03/2015 Echo: EF 30-35%;  b. 01/2016 Echo: EF 30-35%.  . Compression fracture of lumbar vertebra ([place]) 05-2008  . COPD (chronic obstructive pulmonary disease) ([place])   . Diverticulosis of colon   . Dyslipidemia   . Essential hypertension   . Family history of adverse reaction to anesthesia    daughter PONV  . GERD (gastroesophageal reflux disease)   . Hard of hearing   . Hematemesis    a. 03/2015.  [name]  Hx of colonoscopy   . Hypothyroid   . Ischemic cardiomyopathy    a. 03/2015 Echo: EF 30-35%, ant, antsept HK, mod MR, mildly dil LA/RV, mod dil RA, mod-sev TR, PASP 42mmHg;  b. 01/2016 Echo: EF 30-35%, ant/antsept HK, mildly dil Ao root (3.4cm) and Asc Ao (3.5cm), mild [email or web address] bi-atrial enlargement, mild to mod TR. PASP 22mmHg.  [name] LBBB (left bundle branch block)   . Lung abnormality    Dr [name] 122/2011 no further w/u needed  . Mitral regurgitation   . Myocardial infarction 2016   "mild heart attack"  . Nausea    Some nausea with medications, May, 2013,  . Nocturia   . OA (osteoarthritis)   . Orthostatic hypotension    June, 2014  . Osteoporosis   . Pleural effusion associated with pulmonary infection 03-2007  . Prolonged QT interval    when on sotolol in past  . Pulmonary hypertension    a. 03/2015 PASP 24mmHg by echo.  . Shingles 2014  . Thrombocytopenia ([place])   . TR (tricuspid regurgitation)    moderate, echo 2008  . Urinary frequency   . Vitamin B deficiency   . Warfarin anticoagulation   . Wears dentures   . Wears glasses   . Weight loss    August, 2012   Past Surgical History:  Procedure Laterality Date  . COLONOSCOPY    . CORONARY ANGIOPLASTY  2004  . LUMBAR LAMINECTOMY/DECOMPRESSION MICRODISCECTOMY N/A 03/23/2016   Procedure: Lumbar three-four Laminectomy/Foraminotomy, posterior laterial arthrodesis;  Surgeon: Leeroy Cha, MD;  Location: [facility] NEURO ORS;  Service: Neurosurgery;  Laterality: N/A;  L3-4 Laminectomy/Foraminotomy  . THYROIDECTOMY  1962  . TONSILLECTOMY     Family History  Problem Relation Age of Onset  . Heart disease Mother 64  . Stroke Father 20  . Heart disease Sister   . Cancer Brother     colon  . Diabetes Brother   . Leukemia Sister    Social History  Substance Use Topics  . Smoking status: Former Smoker    Types: Cigarettes    Quit date: 09/14/1947  . Smokeless tobacco: Never Used  . Alcohol use No   Allergies  Allergen Reactions  .  Sulfonamide Derivatives Nausea And Vomiting    REACTION: sick  . Alendronate Sodium Other (See Comments)    REACTION: GI   Prior to Admission medications   Medication Sig Start Date End Date Taking? Authorizing Provider  calcium carbonate (OS-CAL) 600 MG TABS tablet Take 1,200 mg by mouth every evening.    Yes Historical Provider, MD  docusate sodium (COLACE) 100 MG capsule Take 100 mg by mouth daily.   Yes Historical Provider, MD  fluticasone (FLONASE) 50 MCG/ACT nasal spray 2 SPRAYS IN BOTH NOSTRILS DAILY 05/28/16  Yes Abner Greenspan, MD  furosemide (LASIX) 20 MG tablet Take 1 tablet (20 mg total) by mouth every other day. Patient taking differently: Take 20 mg by mouth every other day. If weight 126 or greater take lasix daily until weight is down to 123. 10/14/16  Yes Fritzi Mandes, MD  Glucosamine HCl 500 MG TABS Take 500 mg by mouth daily.    Yes Historical Provider, MD  levothyroxine (SYNTHROID, LEVOTHROID) 88 MCG tablet TAKE ONE TABLET BY MOUTH EVERY DAY 11/29/16  Yes Abner Greenspan, MD  metoprolol tartrate (LOPRESSOR) 25 MG tablet Take 0.5 tablets (12.5 mg total) by mouth 2 (two) times daily. Patient taking differently: Take 12.5 mg by mouth 2 (two) times daily. HOLD evening dose if BP is under 110 05/11/16  Yes Minna Merritts, MD  midodrine (PROAMATINE) 5 MG tablet TAKE ONE TABLET BY MOUTH TWICE DAILY WITH A MEAL 01/16/16  Yes Minna Merritts, MD  mirtazapine (REMERON) 15 MG tablet TAKE 1 TABLET BY MOUTH AT BEDTIME 11/29/16  Yes Abner Greenspan, MD  Multiple Vitamin (MULTIVITAMIN) tablet Take 1 tablet by mouth daily.    Yes Historical Provider, MD  multivitamin-lutein (OCUVITE-LUTEIN) CAPS capsule Take 1 capsule by mouth daily.   Yes Historical Provider, MD  pantoprazole (PROTONIX) 40 MG tablet TAKE ONE TABLET BY MOUTH EVERY DAY 11/29/16  Yes Abner Greenspan, MD  Polyethyl Glycol-Propyl Glycol (SYSTANE OP) Apply 1 drop to eye daily.   Yes Historical Provider, MD  potassium chloride (K-DUR) 10 MEQ  tablet Take 1 tablet (10 mEq total) by mouth as needed (take with lasix). 10/15/16 01/13/17 Yes Minna Merritts, MD  simvastatin (ZOCOR) 20 MG tablet TAKE 1/2 TABLET BY MOUTH EVERY EVENING 12/29/16  Yes Abner Greenspan, MD  warfarin (COUMADIN) 5 MG tablet Take 5 mg by mouth daily. Sunday and friday   Yes Historical Provider, MD  warfarin (COUMADIN) 7.5 MG tablet Take 7.5 mg by mouth daily. Monday, Tuesday, Wednesday, Thursday, Saturday   Yes Historical Provider, MD      Review of Systems  Constitutional: Positive for fatigue. Negative for appetite change.  HENT: Negative for  congestion, postnasal drip and sore throat.   Eyes: Negative.   Respiratory: Positive for cough (intermittently) and shortness of breath. Negative for chest tightness.   Cardiovascular: Positive for palpitations. Negative for chest pain and leg swelling.  Gastrointestinal: Negative for abdominal distention and abdominal pain.  Endocrine: Negative.   Genitourinary: Negative.   Musculoskeletal: Negative for back pain and neck pain.  Skin: Negative.   Allergic/Immunologic: Negative.   Neurological: Positive for weakness. Negative for dizziness and light-headedness.  Hematological: Negative for adenopathy. Bruises/bleeds easily.  Psychiatric/Behavioral: Positive for dysphoric mood (due to recent move at [facility]). Negative for sleep disturbance (sleeping on 1 pillow and wedge) and suicidal ideas. The patient is not nervous/anxious.    Vitals:   12/30/16 1055  BP: 110/60  Pulse: 81  Resp: 20  SpO2: 98%  Weight: 123 lb 6 oz (56 kg)  Height: 5\' 7"  (1.702 m)   Wt Readings from Last 3 Encounters:  12/30/16 123 lb 6 oz (56 kg)  11/30/16 125 lb 8 oz (56.9 kg)  11/04/16 123 lb 4 oz (55.9 kg)    Lab Results  Component Value Date   CREATININE 1.19 10/19/2016   CREATININE 1.18 (H) 10/14/2016   CREATININE 1.33 (H) 10/13/2016   Physical Exam  Constitutional: She is oriented to person, place, and time. She appears  well-developed and well-nourished.  HENT:  Head: Normocephalic and atraumatic.  Eyes: Conjunctivae are normal. Pupils are equal, round, and reactive to light.  Neck: Normal range of motion. Neck supple. No JVD present.  Cardiovascular: Normal rate.  An irregular rhythm present.  Pulmonary/Chest: Effort normal. She has no wheezes. She has no rales.  Abdominal: Soft. She exhibits no distension. There is no tenderness.  Musculoskeletal: She exhibits no edema or tenderness.  Neurological: She is alert and oriented to person, place, and time.  Skin: Skin is warm and dry.  Psychiatric: She has a normal mood and affect. Her behavior is normal. Thought content normal.  Nursing note and vitals reviewed.   Assessment & Plan:  1: Chronic heart failure with reduced ejection fraction- - NYHA class II - euvolemic - continues to weigh daily. Instructed to call for an overnight weight gain of >2 pounds or a weekly weight gain of >5 pounds - not adding salt and tries to choose low sodium items at [facility] where she lives - is quite active walking with her walker; does have an aide for bathing assistance and medication administration - saw cardiologist Rockey Situ) 11/04/16 - not interested in changing to entresto at this tiem  2: HTN- - BP looks good today - takes midodrine twice daily - saw PCP ([facility]) 10/19/16  Medication list was reviewed.  Patient opts to not make a return appointment at this time. Advised her that she could call back at anytime to make a follow-up appointment.

## 2016-12-30 NOTE — Patient Instructions (Signed)
Continue weighing daily and call for an overnight weight gain of > 2 pounds or a weekly weight gain of >5 pounds. 

## 2017-01-04 NOTE — Telephone Encounter (Signed)
Pt daughter declined AWV. Pt has been in hospital several times with CHF and flu.

## 2017-01-07 ENCOUNTER — Ambulatory Visit (INDEPENDENT_AMBULATORY_CARE_PROVIDER_SITE_OTHER): Payer: PPO

## 2017-01-07 ENCOUNTER — Telehealth: Payer: Self-pay

## 2017-01-07 DIAGNOSIS — I482 Chronic atrial fibrillation, unspecified: Secondary | ICD-10-CM

## 2017-01-07 DIAGNOSIS — D518 Other vitamin B12 deficiency anemias: Secondary | ICD-10-CM

## 2017-01-07 DIAGNOSIS — Z5181 Encounter for therapeutic drug level monitoring: Secondary | ICD-10-CM

## 2017-01-07 LAB — POCT INR: INR: 3.7

## 2017-01-07 MED ORDER — CYANOCOBALAMIN 1000 MCG/ML IJ SOLN
1000.0000 ug | Freq: Once | INTRAMUSCULAR | Status: AC
  Administered 2017-01-07: 1000 ug via INTRAMUSCULAR

## 2017-01-07 NOTE — Patient Instructions (Signed)
Pre visit review using our clinic review tool, if applicable. No additional management support is needed unless otherwise documented below in the visit note.  INR today is 3.7  Patient has been under a lot of stress lately but otherwise has been doing well with a consistent diet and consistent meds.  She is to hold her coumadin today (4/27) and then take 1.5pills (7.5mg ) daily EXCEPT for 1 pill (5mg ) on Sunday, Tuesday and Fridays.  Recheck in 3-4 weeks.  Patient denies any unusual bruising or bleeding and will go to ER if any concerns develop.  Patient's daughter present (who fills her pill box) and a written calendar given to assist with reminders. Daughter, [name], verbalizes understanding of instructions given today.

## 2017-01-07 NOTE — Telephone Encounter (Signed)
Patient's daughter notifies me of the following needs:  1.  Vitamin B12 1052mcg injection:  States that she was scheduled to restart injections today or updated orders.   FYI:  I did administer Vit b12 1087mcg today; however, I do not see a lab level since 2016.    Please advise:  A. Is patient to continue monthly or q8 week injections per 2016 order.   B.  Should we set up for a B12 lab before continuing?   2.  Power Chair Written R/X: patient has recently been subject to a bed bug infestation at her facility.      She will need a new chair.  Can you write an R/x for her to take to the medical supply store       To get her a new one?  With an R/X she may be able to get insurance to cover.   Call daughter and let her know.  She will come by and pick up R/X. Thanks.

## 2017-01-10 NOTE — Telephone Encounter (Signed)
Patient's daughter [name] notified as instructed by telephone and verbalized understanding. [name] stated that patient is scheduled for another INR check and B-12 injection on 02/04/17. Lab appointment scheduled. [name] stated that patient needs a power lift chair and not a power chair to help her get upright to get into the wheelchair. [name] stated that because of medicare there has to be a reason written on the script as to why she needs the power lift chair. Previous power wheel chair script is on your desk.

## 2017-01-10 NOTE — Telephone Encounter (Signed)
Patient's daughter [name]) notified by telephone that script is up front ready for pickup.

## 2017-01-10 NOTE — Telephone Encounter (Signed)
Please schedule lab for B12 level in 1 mo to draw just before her shot  Px for chair in IN box

## 2017-01-10 NOTE — Telephone Encounter (Signed)
Done and in IN box 

## 2017-01-11 DIAGNOSIS — M6281 Muscle weakness (generalized): Secondary | ICD-10-CM | POA: Diagnosis not present

## 2017-01-25 ENCOUNTER — Telehealth: Payer: Self-pay | Admitting: Family Medicine

## 2017-01-25 NOTE — Telephone Encounter (Signed)
Patients daughter bought a lift chair for her mother at [facility] on 01/10/17. Windy Kalata was told from [facility] that she could request a Retro Authorization and she should call Dr Alba Cory office and request them to call [phone] to get the chair Authorized. Please call [name] back at [phone]

## 2017-01-26 NOTE — Telephone Encounter (Signed)
Form done 

## 2017-01-26 NOTE — Telephone Encounter (Signed)
Called insurance and they advise me that I have to go online and print out a form to try to get it retro authorized. Form printed and placed in Dr. Marliss Coots inbox to complete (with OV note from 10/08/16)

## 2017-01-27 NOTE — Telephone Encounter (Signed)
Form and OV note faxed back

## 2017-02-01 ENCOUNTER — Telehealth: Payer: Self-pay | Admitting: Family Medicine

## 2017-02-01 NOTE — Telephone Encounter (Signed)
[name] with insurance called, they can't approve the lift chair because pt went to [employer] which is not a DME store. She said the only way to get it approved it to take the lift chair back, purchase it at a DME store and then they would cover the motor but not the lift chair if she meets the qualifications. I called pt's daughter and told her this and gave her the reps name and phone # I was talking to and she said she will call them directly and see if there is anything else she can do on her end to get it approved. [name] will call us back if she needs anything further from [place].   I spoke to [name] with pt's insurance and her phone # is [phone] ref. # [id number]

## 2017-02-01 NOTE — Telephone Encounter (Signed)
Deanna from [facility] called in about a prior authorization for pt and requested a call back on [phone]

## 2017-02-01 NOTE — Telephone Encounter (Signed)
Called and no answer so left voicemail requesting Deanna to call back

## 2017-02-02 ENCOUNTER — Other Ambulatory Visit: Payer: Self-pay | Admitting: Family Medicine

## 2017-02-02 DIAGNOSIS — D518 Other vitamin B12 deficiency anemias: Secondary | ICD-10-CM

## 2017-02-04 ENCOUNTER — Other Ambulatory Visit (INDEPENDENT_AMBULATORY_CARE_PROVIDER_SITE_OTHER): Payer: PPO

## 2017-02-04 ENCOUNTER — Ambulatory Visit (INDEPENDENT_AMBULATORY_CARE_PROVIDER_SITE_OTHER): Payer: PPO

## 2017-02-04 ENCOUNTER — Ambulatory Visit: Payer: PPO

## 2017-02-04 ENCOUNTER — Other Ambulatory Visit: Payer: PPO

## 2017-02-04 DIAGNOSIS — Z5181 Encounter for therapeutic drug level monitoring: Secondary | ICD-10-CM | POA: Diagnosis not present

## 2017-02-04 DIAGNOSIS — I482 Chronic atrial fibrillation, unspecified: Secondary | ICD-10-CM

## 2017-02-04 DIAGNOSIS — D518 Other vitamin B12 deficiency anemias: Secondary | ICD-10-CM

## 2017-02-04 LAB — POCT INR: INR: 2.3

## 2017-02-04 LAB — VITAMIN B12: Vitamin B-12: 350 pg/mL (ref 211–911)

## 2017-02-04 NOTE — Patient Instructions (Signed)
Pre visit review using our clinic review tool, if applicable. No additional management support is needed unless otherwise documented below in the visit note. 

## 2017-02-22 ENCOUNTER — Telehealth: Payer: Self-pay

## 2017-02-22 NOTE — Telephone Encounter (Signed)
Patient's daughter calls to confirm okay to received B12 shot when she comes in for her next INR on 03/11/17.  Last B12: 4/27 and labs completed in May, patient is to continue monthly injections.  Okay for injection at 03/11/17 INR visit.  Daughter contacted and made aware.

## 2017-03-01 ENCOUNTER — Other Ambulatory Visit: Payer: Self-pay | Admitting: Family Medicine

## 2017-03-01 NOTE — Telephone Encounter (Signed)
Rout to [facility], Coumadin Clinic nurse

## 2017-03-01 NOTE — Telephone Encounter (Signed)
Patient is compliant with coumadin management.  Will refill X 3 months per clinic protocol.

## 2017-03-08 ENCOUNTER — Encounter: Payer: Self-pay | Admitting: Internal Medicine

## 2017-03-08 ENCOUNTER — Telehealth: Payer: Self-pay | Admitting: Family Medicine

## 2017-03-08 ENCOUNTER — Ambulatory Visit (INDEPENDENT_AMBULATORY_CARE_PROVIDER_SITE_OTHER): Payer: PPO | Admitting: Internal Medicine

## 2017-03-08 VITALS — BP 112/68 | HR 80 | Temp 97.4°F

## 2017-03-08 DIAGNOSIS — R3915 Urgency of urination: Secondary | ICD-10-CM | POA: Diagnosis not present

## 2017-03-08 DIAGNOSIS — R8290 Unspecified abnormal findings in urine: Secondary | ICD-10-CM

## 2017-03-08 DIAGNOSIS — R102 Pelvic and perineal pain: Secondary | ICD-10-CM

## 2017-03-08 DIAGNOSIS — R300 Dysuria: Secondary | ICD-10-CM

## 2017-03-08 DIAGNOSIS — R339 Retention of urine, unspecified: Secondary | ICD-10-CM

## 2017-03-08 LAB — POC URINALSYSI DIPSTICK (AUTOMATED)
Bilirubin, UA: NEGATIVE
GLUCOSE UA: NEGATIVE
Ketones, UA: NEGATIVE
LEUKOCYTES UA: NEGATIVE
Nitrite, UA: NEGATIVE
PH UA: 6 (ref 5.0–8.0)
RBC UA: NEGATIVE
Spec Grav, UA: >=1.03 — AB (ref 1.010–1.025)
UROBILINOGEN UA: 0.2 U/dL

## 2017-03-08 NOTE — Telephone Encounter (Signed)
Patient Name: VERDENE CRESON DOB: 1922-08-06 Initial Comment Caller states that her mother is pain when she urinates and her urinae is dark. Nurse Assessment Nurse: Rock Nephew, RN, Juliann Pulse Date/Time (Eastern Time): 03/08/2017 8:49:47 AM Confirm and document reason for call. If symptomatic, describe symptoms. ---Caller states her mother lives at [place] and is complaining of having dark urine and pain with urination . Caller is not with her mother now . I asked if her mother could call us back and she agreed to have her mother call us . Does the patient have any new or worsening symptoms? ---Yes Will a triage be completed? ---No Select reason for no triage. ---Other Please document clinical information provided and list any resource used. ---caller not with her mother now but is going to have her call us back Guidelines Guideline Title Affirmed Question Affirmed Notes Final Disposition User Clinical Call Rock Nephew, RN, Juliann Pulse

## 2017-03-08 NOTE — Telephone Encounter (Signed)
Windy Kalata called - she got 2 appointment reminder calls for pt and it looks unclear as to which appointment is the correct time.  Can you check and call daughter? cb number is [phone] Thank you

## 2017-03-08 NOTE — Progress Notes (Signed)
Subjective:    Patient ID: [name], female    DOB: 09-Sep-1922, 83 y.o.   MRN: [id number]  HPI  Pt presents to the clinic today with c/o pelvic pressure, incomplete bladder emptying, urine odor, urgency and dysuria. She reports this started yesterday. She denies fever, chills, nausea or low back pain. She has not tried anything OTC for this. She has had UTI's in the past and reports this feels the same. She denies vaginal complaints at this time.  Review of Systems      Past Medical History:  Diagnosis Date  . Allergy   . Anemia   . Arthritis    osteo  . CAD (coronary artery disease)    a. 2 DES, [facility], 2004; b. nuc 2013 septal dyssenergy 2/2 LBBB, nl LV fxn;  c. 06/2015 MV: EF 41%, septal HK (LBBB), no ischemia->Low risk.  . Chronic atrial fibrillation (HCC)    a. on Coumadin (CHA2DS2VASc = 6); b. Holter 06/2010, no brady, mild tachy.  . Chronic systolic CHF (congestive heart failure) ([place])    a. 03/2015 Echo: EF 30-35%;  b. 01/2016 Echo: EF 30-35%.  . Compression fracture of lumbar vertebra ([place]) 05-2008  . COPD (chronic obstructive pulmonary disease) ([place])   . Diverticulosis of colon   . Dyslipidemia   . Essential hypertension   . Family history of adverse reaction to anesthesia    daughter PONV  . GERD (gastroesophageal reflux disease)   . Hard of hearing   . Hematemesis    a. 03/2015.  [name] Hx of colonoscopy   . Hypothyroid   . Ischemic cardiomyopathy    a. 03/2015 Echo: EF 30-35%, ant, antsept HK, mod MR, mildly dil LA/RV, mod dil RA, mod-sev TR, PASP 62mmHg;  b. 01/2016 Echo: EF 30-35%, ant/antsept HK, mildly dil Ao root (3.4cm) and Asc Ao (3.5cm), mild [email or web address] bi-atrial enlargement, mild to mod TR. PASP 8mmHg.  [name] LBBB (left bundle branch block)   . Lung abnormality    Dr [name] 122/2011 no further w/u needed  . Mitral regurgitation   . Myocardial infarction [facility]) 2016   "mild heart attack"  . Nausea    Some nausea with medications, May, 2013,  . Nocturia   .  OA (osteoarthritis)   . Orthostatic hypotension    June, 2014  . Osteoporosis   . Pleural effusion associated with pulmonary infection 03-2007  . Prolonged QT interval    when on sotolol in past  . Pulmonary hypertension ([place])    a. 03/2015 PASP 79mmHg by echo.  . Shingles 2014  . Thrombocytopenia ([place])   . TR (tricuspid regurgitation)    moderate, echo 2008  . Urinary frequency   . Vitamin B deficiency   . Warfarin anticoagulation   . Wears dentures   . Wears glasses   . Weight loss    August, 2012    Current Outpatient Prescriptions  Medication Sig Dispense Refill  . calcium carbonate (OS-CAL) 600 MG TABS tablet Take 1,200 mg by mouth every evening.     . docusate sodium (COLACE) 100 MG capsule Take 100 mg by mouth daily.    . fluticasone (FLONASE) 50 MCG/ACT nasal spray 2 SPRAYS IN BOTH NOSTRILS DAILY 16 g 5  . furosemide (LASIX) 20 MG tablet Take 1 tablet (20 mg total) by mouth every other day. (Patient taking differently: Take 20 mg by mouth every other day. If weight 126 or greater take lasix daily until weight is down to 123.) 90 tablet  1  . Glucosamine HCl 500 MG TABS Take 500 mg by mouth daily.     [name] levothyroxine (SYNTHROID, LEVOTHROID) 88 MCG tablet TAKE ONE TABLET BY MOUTH EVERY DAY 90 tablet 1  . metoprolol tartrate (LOPRESSOR) 25 MG tablet Take 0.5 tablets (12.5 mg total) by mouth 2 (two) times daily. (Patient taking differently: Take 12.5 mg by mouth 2 (two) times daily. HOLD evening dose if BP is under 110) 180 tablet 3  . midodrine (PROAMATINE) 5 MG tablet TAKE ONE TABLET BY MOUTH TWICE DAILY WITH A MEAL 180 tablet 3  . mirtazapine (REMERON) 15 MG tablet TAKE 1 TABLET BY MOUTH AT BEDTIME 90 tablet 1  . Multiple Vitamin (MULTIVITAMIN) tablet Take 1 tablet by mouth daily.     . multivitamin-lutein (OCUVITE-LUTEIN) CAPS capsule Take 1 capsule by mouth daily.    . pantoprazole (PROTONIX) 40 MG tablet TAKE ONE TABLET BY MOUTH EVERY DAY 90 tablet 1  . Polyethyl  Glycol-Propyl Glycol (SYSTANE OP) Apply 1 drop to eye daily.    . simvastatin (ZOCOR) 20 MG tablet TAKE 1/2 TABLET BY MOUTH EVERY EVENING 45 tablet 1  . warfarin (COUMADIN) 5 MG tablet Take 1 tablet (5 mg total) by mouth daily before supper. Take 1 - 2 pills daily AS DIRECTED BY ANTI-COAGULATION CLINIC 180 tablet 0  . potassium chloride (K-DUR) 10 MEQ tablet Take 1 tablet (10 mEq total) by mouth as needed (take with lasix). 90 tablet 3   No current facility-administered medications for this visit.     Allergies  Allergen Reactions  . Sulfonamide Derivatives Nausea And Vomiting    REACTION: sick  . Alendronate Sodium Other (See Comments)    REACTION: GI    Family History  Problem Relation Age of Onset  . Heart disease Mother 37  . Stroke Father 51  . Heart disease Sister   . Cancer Brother        colon  . Diabetes Brother   . Leukemia Sister     Social History   Social History  . Marital status: Widowed    Spouse name: N/A  . Number of children: N/A  . Years of education: N/A   Occupational History  . Not on file.   Social History Main Topics  . Smoking status: Former Smoker    Types: Cigarettes    Quit date: 09/14/1947  . Smokeless tobacco: Never Used  . Alcohol use No  . Drug use: No  . Sexual activity: Not Currently   Other Topics Concern  . Not on file   Social History Narrative   Very independent    Lives in [place] by herself            Constitutional: Denies fever, malaise, fatigue, headache or abrupt weight changes.  Gastrointestinal: Denies abdominal pain, bloating, constipation, diarrhea or blood in the stool.  GU: Pt reports pelvic pressure, urgency, dysuria, and urine odor. Denies frequency, burning sensation, blood in urine, or discharge.    No other specific complaints in a complete review of systems (except as listed in HPI above).  Objective:   Physical Exam  BP 112/68   Pulse 80   Temp 97.4 F (36.3 C) (Oral)   Wt Readings from  Last 3 Encounters:  12/30/16 123 lb 6 oz (56 kg)  11/30/16 125 lb 8 oz (56.9 kg)  11/04/16 123 lb 4 oz (55.9 kg)    General: Appears her stated age, chronically ill appearing, in NAD. Abdomen: Soft and mildly tender over  the bladder. No CVA tenderness noted.   BMET    Component Value Date/Time   NA 140 10/19/2016 1619   NA 143 05/28/2016 1109   NA 144 10/28/2011 1315   NA 144 04/15/2009 1032   K 4.9 10/19/2016 1619   K 4.5 10/28/2011 1315   K 5.7 (H) 04/15/2009 1032   CL 105 10/19/2016 1619   CL 106 10/28/2011 1315   CL 107 04/15/2009 1032   CO2 29 10/19/2016 1619   CO2 29 10/28/2011 1315   CO2 33 04/15/2009 1032   GLUCOSE 98 10/19/2016 1619   GLUCOSE 86 10/28/2011 1315   GLUCOSE 73 04/15/2009 1032   BUN 26 (H) 10/19/2016 1619   BUN 19 05/28/2016 1109   BUN 26 (H) 10/28/2011 1315   BUN 15 04/15/2009 1032   CREATININE 1.19 10/19/2016 1619   CREATININE 1.12 10/28/2011 1315   CREATININE 0.9 04/15/2009 1032   CALCIUM 9.1 10/19/2016 1619   CALCIUM 8.8 10/28/2011 1315   CALCIUM 9.0 04/15/2009 1032   GFRNONAA 39 (L) 10/14/2016 0539   GFRNONAA 49 (L) 10/28/2011 1315   GFRAA 45 (L) 10/14/2016 0539   GFRAA 59 (L) 10/28/2011 1315    Lipid Panel     Component Value Date/Time   CHOL 147 05/12/2015 1639   TRIG 91.0 05/12/2015 1639   HDL 46.00 05/12/2015 1639   CHOLHDL 3 05/12/2015 1639   VLDL 18.2 05/12/2015 1639   LDLCALC 83 05/12/2015 1639    CBC    Component Value Date/Time   WBC 5.8 10/19/2016 1619   RBC 4.04 10/19/2016 1619   HGB 12.6 10/19/2016 1619   HGB WILL FOLLOW 05/28/2016 1109   HGB 12.2 05/04/2012 1326   HCT 38.8 10/19/2016 1619   HCT WILL FOLLOW 05/28/2016 1109   HCT 36.6 05/04/2012 1326   PLT 170.0 10/19/2016 1619   PLT WILL FOLLOW 05/28/2016 1109   MCV 96.1 10/19/2016 1619   MCV WILL FOLLOW 05/28/2016 1109   MCV 95.4 05/04/2012 1326   MCH 31.9 10/13/2016 0542   MCHC 32.5 10/19/2016 1619   RDW 16.2 (H) 10/19/2016 1619   RDW WILL FOLLOW  05/28/2016 1109   RDW 14.4 05/04/2012 1326   LYMPHSABS 1.7 10/19/2016 1619   LYMPHSABS WILL FOLLOW 05/28/2016 1109   LYMPHSABS 1.1 05/04/2012 1326   MONOABS 0.8 10/19/2016 1619   MONOABS 0.8 05/04/2012 1326   EOSABS 0.2 10/19/2016 1619   EOSABS WILL FOLLOW 05/28/2016 1109   EOSABS 0.1 04/15/2009 1032   BASOSABS 0.1 10/19/2016 1619   BASOSABS WILL FOLLOW 05/28/2016 1109   BASOSABS 0.0 05/04/2012 1326    Hgb A1C Lab Results  Component Value Date   HGBA1C 5.3 04/08/2015            Assessment & Plan:   Pelvic Pressure, Incomplete Bladder Emptying, Urine Odor, Urgency and Dysuria:  Urinalysis: trace protein Will send urine culture Last creatinine 1.19, GFR 44.98 Push fluids for now  Will follow up after urine culture, return precautions discussed Webb Silversmith, NP

## 2017-03-08 NOTE — Telephone Encounter (Signed)
[name] pts caregiver said pt was having painful urination, voiding small amts with some odor to urine and darker yellow color than usual. Advised would need an appt. [name] asked me to call phyllis; I spoke with [name] and she scheduled appt with [name] NP on 03/08/17 at 4:15.

## 2017-03-09 LAB — URINE CULTURE: ORGANISM ID, BACTERIA: NO GROWTH

## 2017-03-09 NOTE — Patient Instructions (Signed)

## 2017-03-09 NOTE — Telephone Encounter (Signed)
Patient's daughter requesting clarification around fridays INR appt with the coumadin clinic.  Confirmed with daughter, [name], that appointment is at 11:00am for INR check and B12 shot even though she is on the nurse visit schedule at 10 am.  Daughter verbalizes understanding.

## 2017-03-11 ENCOUNTER — Ambulatory Visit: Payer: PPO

## 2017-03-11 ENCOUNTER — Ambulatory Visit (INDEPENDENT_AMBULATORY_CARE_PROVIDER_SITE_OTHER): Payer: PPO

## 2017-03-11 DIAGNOSIS — I482 Chronic atrial fibrillation, unspecified: Secondary | ICD-10-CM

## 2017-03-11 DIAGNOSIS — D518 Other vitamin B12 deficiency anemias: Secondary | ICD-10-CM | POA: Diagnosis not present

## 2017-03-11 DIAGNOSIS — Z5181 Encounter for therapeutic drug level monitoring: Secondary | ICD-10-CM

## 2017-03-11 LAB — POCT INR: INR: 2.1

## 2017-03-11 MED ORDER — CYANOCOBALAMIN 1000 MCG/ML IJ SOLN
1000.0000 ug | INTRAMUSCULAR | Status: AC
  Administered 2017-03-11 – 2017-04-05 (×2): 1000 ug via INTRAMUSCULAR

## 2017-03-11 NOTE — Patient Instructions (Signed)
Pre visit review using our clinic review tool, if applicable. No additional management support is needed unless otherwise documented below in the visit note. 

## 2017-03-14 ENCOUNTER — Telehealth: Payer: Self-pay | Admitting: Cardiovascular Disease

## 2017-03-14 NOTE — Telephone Encounter (Signed)
Pt daughter calling stating she was advised to give patient lasix every other day and could give her one a day is fluid starts to build up   She is currently taking 20 mg lasix a day  Doesn't seem to be helping much  Left ankle seems to be building up the fluid  Would like to know what else can patient be doing to help, or if there is another medication to help with the fluid  Please advise

## 2017-03-14 NOTE — Telephone Encounter (Signed)
Routing to [facility], NP, as Dr. Rockey Situ is in clinic and she will most likely be able to address sooner. Pt last saw Dr. Rockey Situ 11/04/16 - due for f/u. Saw Tina on 3/20 & 4/19.

## 2017-03-15 ENCOUNTER — Encounter: Payer: Self-pay | Admitting: Family

## 2017-03-15 ENCOUNTER — Ambulatory Visit: Payer: PPO | Attending: Family | Admitting: Family

## 2017-03-15 VITALS — BP 123/54 | HR 78 | Resp 18 | Ht 67.0 in | Wt 122.4 lb

## 2017-03-15 DIAGNOSIS — J449 Chronic obstructive pulmonary disease, unspecified: Secondary | ICD-10-CM | POA: Insufficient documentation

## 2017-03-15 DIAGNOSIS — I340 Nonrheumatic mitral (valve) insufficiency: Secondary | ICD-10-CM | POA: Diagnosis not present

## 2017-03-15 DIAGNOSIS — K920 Hematemesis: Secondary | ICD-10-CM | POA: Diagnosis not present

## 2017-03-15 DIAGNOSIS — I252 Old myocardial infarction: Secondary | ICD-10-CM | POA: Insufficient documentation

## 2017-03-15 DIAGNOSIS — D649 Anemia, unspecified: Secondary | ICD-10-CM | POA: Diagnosis not present

## 2017-03-15 DIAGNOSIS — Z882 Allergy status to sulfonamides status: Secondary | ICD-10-CM | POA: Insufficient documentation

## 2017-03-15 DIAGNOSIS — I272 Pulmonary hypertension, unspecified: Secondary | ICD-10-CM | POA: Diagnosis not present

## 2017-03-15 DIAGNOSIS — I255 Ischemic cardiomyopathy: Secondary | ICD-10-CM | POA: Diagnosis not present

## 2017-03-15 DIAGNOSIS — E785 Hyperlipidemia, unspecified: Secondary | ICD-10-CM | POA: Insufficient documentation

## 2017-03-15 DIAGNOSIS — Z87891 Personal history of nicotine dependence: Secondary | ICD-10-CM | POA: Diagnosis not present

## 2017-03-15 DIAGNOSIS — E539 Vitamin B deficiency, unspecified: Secondary | ICD-10-CM | POA: Diagnosis not present

## 2017-03-15 DIAGNOSIS — I482 Chronic atrial fibrillation: Secondary | ICD-10-CM | POA: Insufficient documentation

## 2017-03-15 DIAGNOSIS — I447 Left bundle-branch block, unspecified: Secondary | ICD-10-CM | POA: Diagnosis not present

## 2017-03-15 DIAGNOSIS — I10 Essential (primary) hypertension: Secondary | ICD-10-CM

## 2017-03-15 DIAGNOSIS — I110 Hypertensive heart disease with heart failure: Secondary | ICD-10-CM | POA: Insufficient documentation

## 2017-03-15 DIAGNOSIS — I5022 Chronic systolic (congestive) heart failure: Secondary | ICD-10-CM | POA: Diagnosis not present

## 2017-03-15 DIAGNOSIS — Z7901 Long term (current) use of anticoagulants: Secondary | ICD-10-CM | POA: Diagnosis not present

## 2017-03-15 DIAGNOSIS — E039 Hypothyroidism, unspecified: Secondary | ICD-10-CM | POA: Diagnosis not present

## 2017-03-15 DIAGNOSIS — M1990 Unspecified osteoarthritis, unspecified site: Secondary | ICD-10-CM | POA: Insufficient documentation

## 2017-03-15 DIAGNOSIS — K219 Gastro-esophageal reflux disease without esophagitis: Secondary | ICD-10-CM | POA: Insufficient documentation

## 2017-03-15 MED ORDER — TORSEMIDE 20 MG PO TABS: 20.0000 mg | ORAL_TABLET | Freq: Every day | ORAL | 5 refills | Status: DC

## 2017-03-15 NOTE — Patient Instructions (Addendum)
Continue weighing daily and call for an overnight weight gain of > 2 pounds or a weekly weight gain of >5 pounds.  Changing fluid pill to torsemide 20mg  daily.

## 2017-03-15 NOTE — Progress Notes (Signed)
Patient ID: [name], female    DOB: 02/20/1922, 83 y.o.   MRN: [id number]  HPI  Ms [name] is a 83 y/o female with a history of vitamin B deficiency, pulmonary HTN, osteoarthritis, MI, LBBB, hypothyroidism, GERD, COPD, atrial fibrillation, anemia, dyslipidemia, remote tobacco use and chronic heart failure.   Last echo was done 02/02/16 and showed an EF of 30-35% along with mild MR and mild/mod TR. PA pressure was 33 mm Hg. EF is the same as 2016 but valve leakage has dramatically improved along with improved of right side heart pressure.   Admitted 10/10/16 with HF exacerbation along with influenza A. Initially needed IV diuretics. Cardiology consult was obtained. Lost 5 L during admission. Treated with tamiflu. Discharged back to assisted living facility.   She presents today for her follow-up visit with a chief complaint of increasing left ankle edema. She says that she's had intermittent edema for years but it's gotten worse over the last few days with increasing weight. Has been taking furosemide daily instead of every other day but without much improvement. She has associated fatigue, shortness of breath, cough and weakness along with this.   Past Medical History:  Diagnosis Date  . Allergy   . Anemia   . Arthritis    osteo  . CAD (coronary artery disease)    a. 2 DES, [facility], 2004; b. nuc 2013 septal dyssenergy 2/2 LBBB, nl LV fxn;  c. 06/2015 MV: EF 41%, septal HK (LBBB), no ischemia->Low risk.  . Chronic atrial fibrillation (HCC)    a. on Coumadin (CHA2DS2VASc = 6); b. Holter 06/2010, no brady, mild tachy.  . Chronic systolic CHF (congestive heart failure) ([place])    a. 03/2015 Echo: EF 30-35%;  b. 01/2016 Echo: EF 30-35%.  . Compression fracture of lumbar vertebra ([place]) 05-2008  . COPD (chronic obstructive pulmonary disease) ([place])   . Diverticulosis of colon   . Dyslipidemia   . Essential hypertension   . Family history of adverse reaction to anesthesia    daughter PONV  .  GERD (gastroesophageal reflux disease)   . Hard of hearing   . Hematemesis    a. 03/2015.  [name] Hx of colonoscopy   . Hypothyroid   . Ischemic cardiomyopathy    a. 03/2015 Echo: EF 30-35%, ant, antsept HK, mod MR, mildly dil LA/RV, mod dil RA, mod-sev TR, PASP 37mmHg;  b. 01/2016 Echo: EF 30-35%, ant/antsept HK, mildly dil Ao root (3.4cm) and Asc Ao (3.5cm), mild [email or web address] bi-atrial enlargement, mild to mod TR. PASP 18mmHg.  [name] LBBB (left bundle branch block)   . Lung abnormality    Dr [name] 122/2011 no further w/u needed  . Mitral regurgitation   . Myocardial infarction [facility]) 2016   "mild heart attack"  . Nausea    Some nausea with medications, May, 2013,  . Nocturia   . OA (osteoarthritis)   . Orthostatic hypotension    June, 2014  . Osteoporosis   . Pleural effusion associated with pulmonary infection 03-2007  . Prolonged QT interval    when on sotolol in past  . Pulmonary hypertension ([place])    a. 03/2015 PASP 33mmHg by echo.  . Shingles 2014  . Thrombocytopenia ([place])   . TR (tricuspid regurgitation)    moderate, echo 2008  . Urinary frequency   . Vitamin B deficiency   . Warfarin anticoagulation   . Wears dentures   . Wears glasses   . Weight loss    August, 2012  Past Surgical History:  Procedure Laterality Date  . COLONOSCOPY    . CORONARY ANGIOPLASTY  2004  . LUMBAR LAMINECTOMY/DECOMPRESSION MICRODISCECTOMY N/A 03/23/2016   Procedure: Lumbar three-four Laminectomy/Foraminotomy, posterior laterial arthrodesis;  Surgeon: Leeroy Cha, MD;  Location: [facility] NEURO ORS;  Service: Neurosurgery;  Laterality: N/A;  L3-4 Laminectomy/Foraminotomy  . THYROIDECTOMY  1962  . TONSILLECTOMY     Family History  Problem Relation Age of Onset  . Heart disease Mother 81  . Stroke Father 71  . Heart disease Sister   . Cancer Brother        colon  . Diabetes Brother   . Leukemia Sister    Social History  Substance Use Topics  . Smoking status: Former Smoker    Types: Cigarettes     Quit date: 09/14/1947  . Smokeless tobacco: Never Used  . Alcohol use No   Allergies  Allergen Reactions  . Sulfonamide Derivatives Nausea And Vomiting    REACTION: sick  . Alendronate Sodium Other (See Comments)    REACTION: GI   Prior to Admission medications   Medication Sig Start Date End Date Taking? Authorizing Provider  calcium carbonate (OS-CAL) 600 MG TABS tablet Take 1,200 mg by mouth every evening.    Yes Historical Provider, MD  docusate sodium (COLACE) 100 MG capsule Take 100 mg by mouth daily.   Yes Historical Provider, MD  fluticasone (FLONASE) 50 MCG/ACT nasal spray 2 SPRAYS IN BOTH NOSTRILS DAILY 05/28/16  Yes Abner Greenspan, MD  furosemide (LASIX) 20 MG tablet Take 1 tablet (20 mg total) by mouth every other day. Patient taking differently: Take 20 mg by mouth every other day. If weight 126 or greater take lasix daily until weight is down to 123. 10/14/16  Yes Fritzi Mandes, MD  Glucosamine HCl 500 MG TABS Take 500 mg by mouth daily.    Yes Historical Provider, MD  levothyroxine (SYNTHROID, LEVOTHROID) 88 MCG tablet TAKE ONE TABLET BY MOUTH EVERY DAY 11/29/16  Yes Abner Greenspan, MD  metoprolol tartrate (LOPRESSOR) 25 MG tablet Take 0.5 tablets (12.5 mg total) by mouth 2 (two) times daily. Patient taking differently: Take 12.5 mg by mouth 2 (two) times daily. HOLD evening dose if BP is under 110 05/11/16  Yes Minna Merritts, MD  midodrine (PROAMATINE) 5 MG tablet TAKE ONE TABLET BY MOUTH TWICE DAILY WITH A MEAL 01/16/16  Yes Minna Merritts, MD  mirtazapine (REMERON) 15 MG tablet TAKE 1 TABLET BY MOUTH AT BEDTIME 11/29/16  Yes Abner Greenspan, MD  Multiple Vitamin (MULTIVITAMIN) tablet Take 1 tablet by mouth daily.    Yes Historical Provider, MD  multivitamin-lutein (OCUVITE-LUTEIN) CAPS capsule Take 1 capsule by mouth daily.   Yes Historical Provider, MD  pantoprazole (PROTONIX) 40 MG tablet TAKE ONE TABLET BY MOUTH EVERY DAY 11/29/16  Yes Abner Greenspan, MD  Polyethyl Glycol-Propyl  Glycol (SYSTANE OP) Apply 1 drop to eye daily.   Yes Historical Provider, MD  potassium chloride (K-DUR) 10 MEQ tablet Take 1 tablet (10 mEq total) by mouth as needed (take with lasix). 10/15/16 01/13/17 Yes Minna Merritts, MD  simvastatin (ZOCOR) 20 MG tablet TAKE 1/2 TABLET BY MOUTH EVERY EVENING 12/29/16  Yes Abner Greenspan, MD  warfarin (COUMADIN) 5 MG tablet Take 5 mg by mouth daily. Sunday and friday   Yes Historical Provider, MD  warfarin (COUMADIN) 7.5 MG tablet Take 7.5 mg by mouth daily. Monday, Tuesday, Wednesday, Thursday, Saturday   Yes Historical Provider, MD  Review of Systems  Constitutional: Positive for fatigue. Negative for appetite change.  HENT: Negative for congestion and postnasal drip.   Eyes: Negative.   Respiratory: Positive for cough (intermittently) and shortness of breath. Negative for chest tightness.   Cardiovascular: Positive for leg swelling. Negative for chest pain and palpitations.  Gastrointestinal: Negative for abdominal distention and abdominal pain.  Endocrine: Negative.   Genitourinary: Negative.   Musculoskeletal: Negative for back pain.  Skin: Negative.   Allergic/Immunologic: Negative.   Neurological: Positive for weakness. Negative for dizziness and light-headedness.  Hematological: Negative for adenopathy. Bruises/bleeds easily.  Psychiatric/Behavioral: Negative for dysphoric mood, sleep disturbance (sleeping on 1 pillow and wedge) and suicidal ideas. The patient is not nervous/anxious.    Vitals:   03/15/17 0949  BP: (!) 123/54  Pulse: 78  Resp: 18  SpO2: 98%  Weight: 122 lb 6 oz (55.5 kg)  Height: 5\' 7"  (1.702 m)   Wt Readings from Last 3 Encounters:  03/15/17 122 lb 6 oz (55.5 kg)  12/30/16 123 lb 6 oz (56 kg)  11/30/16 125 lb 8 oz (56.9 kg)    Lab Results  Component Value Date   CREATININE 1.19 10/19/2016   CREATININE 1.18 (H) 10/14/2016   CREATININE 1.33 (H) 10/13/2016   Physical Exam  Constitutional: She is oriented to  person, place, and time. She appears well-developed and well-nourished.  HENT:  Head: Normocephalic and atraumatic.  Neck: Normal range of motion. Neck supple. No JVD present.  Cardiovascular: Normal rate.  An irregular rhythm present.  Pulmonary/Chest: Effort normal. She has no wheezes. She has no rales.  Abdominal: Soft. She exhibits no distension. There is no tenderness.  Musculoskeletal: She exhibits edema (1+ pitting edema around left ankle). She exhibits no tenderness.  Neurological: She is alert and oriented to person, place, and time.  Skin: Skin is warm and dry.  Psychiatric: She has a normal mood and affect. Her behavior is normal. Thought content normal.  Nursing note and vitals reviewed.   Assessment & Plan:  1: Chronic heart failure with reduced ejection fraction- - NYHA class II - euvolemic - continues to weigh daily. Instructed to call for an overnight weight gain of >2 pounds or a weekly weight gain of >5 pounds - not adding salt and tries to choose low sodium items at [facility] where she lives - is quite active walking with her walker; does have an aide for bathing assistance and medication administration - saw cardiologist Rockey Situ) 11/04/16 and returns to him August 2018 - not interested in changing to entresto at this time - will change her diuretic to torsemide 20mg  daily. Advised daughter that if patient responds well to it, that she could decrease it back to every other day - sleeping with a pillow under her legs  2: HTN- - BP looks good today - takes midodrine twice daily - saw PCP ([facility]) 10/19/16  Medication list was reviewed.  Patient is not interested in making a return appointment at this time. Advised patient and daughter that they could call back to schedule an appointment at any time.

## 2017-03-17 ENCOUNTER — Telehealth: Payer: Self-pay | Admitting: Family

## 2017-03-17 NOTE — Telephone Encounter (Signed)
Patient's daughter called to report weight. Patient took one 20mg  torsemide yesterday. Yesterday her weight was 122.8 pounds and today it was 119 pounds. Daughter was calling for guidance about diuretic.  Advised daughter that patient could back off to every other day with the torsemide and if the weight goes back up to 122 pounds, that she could give patient the torsemide daily. If she drops more fluid with taking it every other day, she could give the torsemide every 2 days or as needed based on weight/edema.  Daughter verbalized understanding of instructions.

## 2017-04-05 ENCOUNTER — Ambulatory Visit (INDEPENDENT_AMBULATORY_CARE_PROVIDER_SITE_OTHER): Payer: PPO

## 2017-04-05 ENCOUNTER — Telehealth: Payer: Self-pay

## 2017-04-05 DIAGNOSIS — D518 Other vitamin B12 deficiency anemias: Secondary | ICD-10-CM | POA: Diagnosis not present

## 2017-04-05 DIAGNOSIS — I482 Chronic atrial fibrillation, unspecified: Secondary | ICD-10-CM

## 2017-04-05 DIAGNOSIS — Z5181 Encounter for therapeutic drug level monitoring: Secondary | ICD-10-CM

## 2017-04-05 LAB — POCT INR: INR: 2.8

## 2017-04-05 NOTE — Patient Instructions (Signed)
Pre visit review using our clinic review tool, if applicable. No additional management support is needed unless otherwise documented below in the visit note. 

## 2017-04-05 NOTE — Telephone Encounter (Signed)
That treatment plan sounds ok  Thanks

## 2017-04-05 NOTE — Telephone Encounter (Signed)
While patient in for her coumadin clinic appointment this am, daughter reports mom has the following symptoms:  3 days out - Increasing cough and congestion  Intermittent cough (worse in pm) productive with yellowish sputum Chest congestion "Some throat drainage"  Has been exposed to many colds at her facility  Overall states she feels okay She denies fever, ear pain, or sore throat.  No itchy or watery eyes.    No SOB or wheezing.    Assessment today:  Temp: 98.0, Pulse ox 98% on RA Skin color pink Lungs clear to auscultation with slight crackle at lung bases. No signs of distress.   Daily weights are stable  Has noted increase in HR at times 90 - 115, with one reading 140 following a coughing spell.    Has appt with Dr. Rockey Situ in August for cardiac f/u.    Suggested OTC Mucinex DM and Tylenol as needed for general aches and discomfort and if no better or gets worse to follow up with Dr Glori Bickers in 2 -3 days.  Encouraged patient to increase fluid intake especially while taking mucinex to help thin and expectorate mucous.  Will review above treatment plan with Dr. Glori Bickers for approval and notify patient if okay to proceed.    Please Advise.

## 2017-04-05 NOTE — Telephone Encounter (Signed)
Notified patient's daughter, [name], of approved treatment plan.  FYI to Dr. Glori Bickers:  Patient's daughter bought plain Mucinex 400mg  tablets.  She will give her mother 2 (800mg ) tablets every morning and every night (total 1600mg  per 24hr) with a full glass of water.  If no improvement, will call the office in 2-3 days.   Thanks.

## 2017-04-05 NOTE — Telephone Encounter (Signed)
That sounds fine

## 2017-04-07 ENCOUNTER — Ambulatory Visit (INDEPENDENT_AMBULATORY_CARE_PROVIDER_SITE_OTHER): Payer: PPO | Admitting: Family Medicine

## 2017-04-07 ENCOUNTER — Encounter: Payer: Self-pay | Admitting: Family Medicine

## 2017-04-07 ENCOUNTER — Ambulatory Visit
Admission: RE | Admit: 2017-04-07 | Discharge: 2017-04-07 | Disposition: A | Discharge status: Home / Self Care | Payer: PPO | Source: Ambulatory Visit | Attending: Family Medicine | Admitting: Family Medicine

## 2017-04-07 ENCOUNTER — Telehealth: Payer: Self-pay

## 2017-04-07 ENCOUNTER — Encounter: Payer: Self-pay | Admitting: Emergency Medicine

## 2017-04-07 ENCOUNTER — Inpatient Hospital Stay
Admission: EM | Admit: 2017-04-07 | Discharge: 2017-04-15 | DRG: 193 | Disposition: A | Discharge status: Skilled Nursing Facility | Payer: PPO | Attending: Internal Medicine | Admitting: Internal Medicine

## 2017-04-07 ENCOUNTER — Emergency Department: Payer: PPO

## 2017-04-07 DIAGNOSIS — N179 Acute kidney failure, unspecified: Secondary | ICD-10-CM | POA: Diagnosis not present

## 2017-04-07 DIAGNOSIS — M810 Age-related osteoporosis without current pathological fracture: Secondary | ICD-10-CM | POA: Diagnosis present

## 2017-04-07 DIAGNOSIS — I509 Heart failure, unspecified: Secondary | ICD-10-CM | POA: Diagnosis not present

## 2017-04-07 DIAGNOSIS — I951 Orthostatic hypotension: Secondary | ICD-10-CM | POA: Diagnosis present

## 2017-04-07 DIAGNOSIS — Z882 Allergy status to sulfonamides status: Secondary | ICD-10-CM

## 2017-04-07 DIAGNOSIS — R011 Cardiac murmur, unspecified: Secondary | ICD-10-CM | POA: Diagnosis present

## 2017-04-07 DIAGNOSIS — I2510 Atherosclerotic heart disease of native coronary artery without angina pectoris: Secondary | ICD-10-CM | POA: Diagnosis not present

## 2017-04-07 DIAGNOSIS — I5043 Acute on chronic combined systolic (congestive) and diastolic (congestive) heart failure: Secondary | ICD-10-CM | POA: Diagnosis present

## 2017-04-07 DIAGNOSIS — M6281 Muscle weakness (generalized): Secondary | ICD-10-CM | POA: Diagnosis not present

## 2017-04-07 DIAGNOSIS — Z515 Encounter for palliative care: Secondary | ICD-10-CM | POA: Diagnosis not present

## 2017-04-07 DIAGNOSIS — I272 Pulmonary hypertension, unspecified: Secondary | ICD-10-CM | POA: Diagnosis present

## 2017-04-07 DIAGNOSIS — G8929 Other chronic pain: Secondary | ICD-10-CM | POA: Diagnosis present

## 2017-04-07 DIAGNOSIS — Z8249 Family history of ischemic heart disease and other diseases of the circulatory system: Secondary | ICD-10-CM

## 2017-04-07 DIAGNOSIS — E860 Dehydration: Secondary | ICD-10-CM | POA: Diagnosis present

## 2017-04-07 DIAGNOSIS — K219 Gastro-esophageal reflux disease without esophagitis: Secondary | ICD-10-CM | POA: Diagnosis not present

## 2017-04-07 DIAGNOSIS — R0602 Shortness of breath: Secondary | ICD-10-CM | POA: Diagnosis not present

## 2017-04-07 DIAGNOSIS — E785 Hyperlipidemia, unspecified: Secondary | ICD-10-CM | POA: Diagnosis not present

## 2017-04-07 DIAGNOSIS — E539 Vitamin B deficiency, unspecified: Secondary | ICD-10-CM | POA: Diagnosis present

## 2017-04-07 DIAGNOSIS — E890 Postprocedural hypothyroidism: Secondary | ICD-10-CM | POA: Diagnosis present

## 2017-04-07 DIAGNOSIS — I255 Ischemic cardiomyopathy: Secondary | ICD-10-CM | POA: Diagnosis present

## 2017-04-07 DIAGNOSIS — I482 Chronic atrial fibrillation, unspecified: Secondary | ICD-10-CM | POA: Diagnosis present

## 2017-04-07 DIAGNOSIS — J189 Pneumonia, unspecified organism: Principal | ICD-10-CM | POA: Diagnosis present

## 2017-04-07 DIAGNOSIS — R059 Cough, unspecified: Secondary | ICD-10-CM | POA: Insufficient documentation

## 2017-04-07 DIAGNOSIS — J440 Chronic obstructive pulmonary disease with acute lower respiratory infection: Secondary | ICD-10-CM | POA: Diagnosis present

## 2017-04-07 DIAGNOSIS — I447 Left bundle-branch block, unspecified: Secondary | ICD-10-CM | POA: Diagnosis present

## 2017-04-07 DIAGNOSIS — I130 Hypertensive heart and chronic kidney disease with heart failure and stage 1 through stage 4 chronic kidney disease, or unspecified chronic kidney disease: Secondary | ICD-10-CM | POA: Diagnosis present

## 2017-04-07 DIAGNOSIS — E43 Unspecified severe protein-calorie malnutrition: Secondary | ICD-10-CM | POA: Diagnosis not present

## 2017-04-07 DIAGNOSIS — Z66 Do not resuscitate: Secondary | ICD-10-CM | POA: Diagnosis present

## 2017-04-07 DIAGNOSIS — Z681 Body mass index (BMI) 19 or less, adult: Secondary | ICD-10-CM

## 2017-04-07 DIAGNOSIS — I10 Essential (primary) hypertension: Secondary | ICD-10-CM | POA: Diagnosis not present

## 2017-04-07 DIAGNOSIS — R278 Other lack of coordination: Secondary | ICD-10-CM | POA: Diagnosis not present

## 2017-04-07 DIAGNOSIS — Z79890 Hormone replacement therapy (postmenopausal): Secondary | ICD-10-CM

## 2017-04-07 DIAGNOSIS — Z800 Family history of malignant neoplasm of digestive organs: Secondary | ICD-10-CM

## 2017-04-07 DIAGNOSIS — Z7189 Other specified counseling: Secondary | ICD-10-CM | POA: Diagnosis not present

## 2017-04-07 DIAGNOSIS — Z823 Family history of stroke: Secondary | ICD-10-CM

## 2017-04-07 DIAGNOSIS — I081 Rheumatic disorders of both mitral and tricuspid valves: Secondary | ICD-10-CM | POA: Diagnosis present

## 2017-04-07 DIAGNOSIS — Y95 Nosocomial condition: Secondary | ICD-10-CM | POA: Diagnosis present

## 2017-04-07 DIAGNOSIS — Z8701 Personal history of pneumonia (recurrent): Secondary | ICD-10-CM | POA: Diagnosis present

## 2017-04-07 DIAGNOSIS — R531 Weakness: Secondary | ICD-10-CM | POA: Diagnosis not present

## 2017-04-07 DIAGNOSIS — Z87891 Personal history of nicotine dependence: Secondary | ICD-10-CM | POA: Diagnosis not present

## 2017-04-07 DIAGNOSIS — Z9861 Coronary angioplasty status: Secondary | ICD-10-CM

## 2017-04-07 DIAGNOSIS — D518 Other vitamin B12 deficiency anemias: Secondary | ICD-10-CM | POA: Diagnosis present

## 2017-04-07 DIAGNOSIS — R062 Wheezing: Secondary | ICD-10-CM

## 2017-04-07 DIAGNOSIS — Z806 Family history of leukemia: Secondary | ICD-10-CM

## 2017-04-07 DIAGNOSIS — L8990 Pressure ulcer of unspecified site, unspecified stage: Secondary | ICD-10-CM | POA: Insufficient documentation

## 2017-04-07 DIAGNOSIS — Z79899 Other long term (current) drug therapy: Secondary | ICD-10-CM

## 2017-04-07 DIAGNOSIS — J181 Lobar pneumonia, unspecified organism: Secondary | ICD-10-CM | POA: Diagnosis not present

## 2017-04-07 DIAGNOSIS — N183 Chronic kidney disease, stage 3 (moderate): Secondary | ICD-10-CM | POA: Diagnosis present

## 2017-04-07 DIAGNOSIS — Z7901 Long term (current) use of anticoagulants: Secondary | ICD-10-CM

## 2017-04-07 DIAGNOSIS — Z972 Presence of dental prosthetic device (complete) (partial): Secondary | ICD-10-CM

## 2017-04-07 DIAGNOSIS — I252 Old myocardial infarction: Secondary | ICD-10-CM

## 2017-04-07 DIAGNOSIS — R262 Difficulty in walking, not elsewhere classified: Secondary | ICD-10-CM | POA: Diagnosis not present

## 2017-04-07 DIAGNOSIS — Z833 Family history of diabetes mellitus: Secondary | ICD-10-CM

## 2017-04-07 DIAGNOSIS — X58XXXA Exposure to other specified factors, initial encounter: Secondary | ICD-10-CM | POA: Diagnosis present

## 2017-04-07 DIAGNOSIS — R1310 Dysphagia, unspecified: Secondary | ICD-10-CM | POA: Diagnosis present

## 2017-04-07 DIAGNOSIS — R05 Cough: Secondary | ICD-10-CM | POA: Diagnosis not present

## 2017-04-07 DIAGNOSIS — R0603 Acute respiratory distress: Secondary | ICD-10-CM | POA: Diagnosis not present

## 2017-04-07 DIAGNOSIS — R630 Anorexia: Secondary | ICD-10-CM | POA: Diagnosis not present

## 2017-04-07 DIAGNOSIS — E876 Hypokalemia: Secondary | ICD-10-CM | POA: Diagnosis present

## 2017-04-07 DIAGNOSIS — D696 Thrombocytopenia, unspecified: Secondary | ICD-10-CM | POA: Diagnosis present

## 2017-04-07 DIAGNOSIS — M549 Dorsalgia, unspecified: Secondary | ICD-10-CM | POA: Diagnosis present

## 2017-04-07 DIAGNOSIS — J9601 Acute respiratory failure with hypoxia: Secondary | ICD-10-CM | POA: Diagnosis not present

## 2017-04-07 DIAGNOSIS — Z981 Arthrodesis status: Secondary | ICD-10-CM

## 2017-04-07 DIAGNOSIS — R0902 Hypoxemia: Secondary | ICD-10-CM | POA: Diagnosis not present

## 2017-04-07 DIAGNOSIS — Z888 Allergy status to other drugs, medicaments and biological substances status: Secondary | ICD-10-CM

## 2017-04-07 DIAGNOSIS — S8011XA Contusion of right lower leg, initial encounter: Secondary | ICD-10-CM | POA: Diagnosis present

## 2017-04-07 DIAGNOSIS — I4891 Unspecified atrial fibrillation: Secondary | ICD-10-CM | POA: Diagnosis not present

## 2017-04-07 DIAGNOSIS — H9190 Unspecified hearing loss, unspecified ear: Secondary | ICD-10-CM | POA: Diagnosis present

## 2017-04-07 DIAGNOSIS — Z7401 Bed confinement status: Secondary | ICD-10-CM | POA: Diagnosis not present

## 2017-04-07 DIAGNOSIS — I5022 Chronic systolic (congestive) heart failure: Secondary | ICD-10-CM | POA: Diagnosis not present

## 2017-04-07 DIAGNOSIS — I361 Nonrheumatic tricuspid (valve) insufficiency: Secondary | ICD-10-CM | POA: Diagnosis not present

## 2017-04-07 DIAGNOSIS — I700 Atherosclerosis of aorta: Secondary | ICD-10-CM | POA: Diagnosis present

## 2017-04-07 DIAGNOSIS — Z9981 Dependence on supplemental oxygen: Secondary | ICD-10-CM | POA: Diagnosis not present

## 2017-04-07 LAB — CBC WITH DIFFERENTIAL/PLATELET
BASOS ABS: 0 10*3/uL (ref 0–0.1)
Basophils Relative: 0 %
EOS ABS: 0 10*3/uL (ref 0–0.7)
EOS PCT: 0 %
HCT: 33.9 % — ABNORMAL LOW (ref 35.0–47.0)
Hemoglobin: 11.4 g/dL — ABNORMAL LOW (ref 12.0–16.0)
LYMPHS PCT: 9 %
Lymphs Abs: 1 10*3/uL (ref 1.0–3.6)
MCH: 31.4 pg (ref 26.0–34.0)
MCHC: 33.5 g/dL (ref 32.0–36.0)
MCV: 93.7 fL (ref 80.0–100.0)
MONO ABS: 1.9 10*3/uL — AB (ref 0.2–0.9)
Monocytes Relative: 18 %
Neutro Abs: 8 10*3/uL — ABNORMAL HIGH (ref 1.4–6.5)
Neutrophils Relative %: 73 %
PLATELETS: 126 10*3/uL — AB (ref 150–440)
RBC: 3.61 MIL/uL — ABNORMAL LOW (ref 3.80–5.20)
RDW: 14.3 % (ref 11.5–14.5)
WBC: 10.9 10*3/uL (ref 3.6–11.0)

## 2017-04-07 LAB — MRSA PCR SCREENING: MRSA BY PCR: NEGATIVE

## 2017-04-07 LAB — COMPREHENSIVE METABOLIC PANEL
ALT: 17 U/L (ref 14–54)
AST: 32 U/L (ref 15–41)
Albumin: 3.7 g/dL (ref 3.5–5.0)
Alkaline Phosphatase: 67 U/L (ref 38–126)
Anion gap: 9 (ref 5–15)
BUN: 44 mg/dL — AB (ref 6–20)
CHLORIDE: 104 mmol/L (ref 101–111)
CO2: 24 mmol/L (ref 22–32)
Calcium: 8.9 mg/dL (ref 8.9–10.3)
Creatinine, Ser: 1.8 mg/dL — ABNORMAL HIGH (ref 0.44–1.00)
GFR, EST AFRICAN AMERICAN: 27 mL/min — AB (ref >60)
GFR, EST NON AFRICAN AMERICAN: 23 mL/min — AB (ref >60)
Glucose, Bld: 129 mg/dL — ABNORMAL HIGH (ref 65–99)
POTASSIUM: 4.5 mmol/L (ref 3.5–5.1)
SODIUM: 137 mmol/L (ref 135–145)
Total Bilirubin: 1.4 mg/dL — ABNORMAL HIGH (ref 0.3–1.2)
Total Protein: 6.7 g/dL (ref 6.5–8.1)

## 2017-04-07 LAB — PROTIME-INR
INR: 3.49
Prothrombin Time: 35.9 seconds — ABNORMAL HIGH (ref 11.4–15.2)

## 2017-04-07 LAB — BRAIN NATRIURETIC PEPTIDE: B NATRIURETIC PEPTIDE 5: 880 pg/mL — AB (ref 0.0–100.0)

## 2017-04-07 LAB — TROPONIN I: TROPONIN I: 0.09 ng/mL — AB (ref <0.03)

## 2017-04-07 MED ORDER — OCUVITE-LUTEIN PO CAPS
1.0000 | ORAL_CAPSULE | Freq: Every day | ORAL | Status: DC
  Administered 2017-04-09 – 2017-04-15 (×7): 1 via ORAL
  Filled 2017-04-07 (×8): qty 1

## 2017-04-07 MED ORDER — ADULT MULTIVITAMIN W/MINERALS CH
1.0000 | ORAL_TABLET | Freq: Every day | ORAL | Status: DC
  Administered 2017-04-08 – 2017-04-15 (×8): 1 via ORAL
  Filled 2017-04-07 (×8): qty 1

## 2017-04-07 MED ORDER — CALCIUM CARBONATE ANTACID 500 MG PO CHEW
1000.0000 mg | CHEWABLE_TABLET | Freq: Every evening | ORAL | Status: DC
  Administered 2017-04-07 – 2017-04-15 (×9): 1000 mg via ORAL
  Filled 2017-04-07 (×9): qty 5

## 2017-04-07 MED ORDER — WARFARIN SODIUM 5 MG PO TABS
5.0000 mg | ORAL_TABLET | Freq: Every day | ORAL | Status: DC
Start: 2017-04-07 — End: 2017-04-07

## 2017-04-07 MED ORDER — VANCOMYCIN HCL IN DEXTROSE 1-5 GM/200ML-% IV SOLN
1000.0000 mg | Freq: Once | INTRAVENOUS | Status: AC
  Administered 2017-04-07: 1000 mg via INTRAVENOUS
  Filled 2017-04-07: qty 200

## 2017-04-07 MED ORDER — DEXTROSE 5 % IV SOLN: 1.0000 g | Freq: Once | INTRAVENOUS | Status: DC

## 2017-04-07 MED ORDER — VANCOMYCIN HCL IN DEXTROSE 1-5 GM/200ML-% IV SOLN
1000.0000 mg | Freq: Once | INTRAVENOUS | Status: DC
  Filled 2017-04-07: qty 200

## 2017-04-07 MED ORDER — DEXTROSE 5 % IV SOLN
1.0000 g | Freq: Once | INTRAVENOUS | Status: AC
  Administered 2017-04-07: 1 g via INTRAVENOUS
  Filled 2017-04-07: qty 1

## 2017-04-07 MED ORDER — FLUTICASONE PROPIONATE 50 MCG/ACT NA SUSP
1.0000 | Freq: Every day | NASAL | Status: DC
  Administered 2017-04-10 – 2017-04-15 (×6): 1 via NASAL
  Filled 2017-04-07 (×3): qty 16

## 2017-04-07 MED ORDER — DEXTROSE 5 % IV SOLN
2.0000 g | Freq: Every day | INTRAVENOUS | Status: DC
  Administered 2017-04-08 – 2017-04-10 (×3): 2 g via INTRAVENOUS
  Filled 2017-04-07 (×5): qty 2

## 2017-04-07 MED ORDER — ACETAMINOPHEN 650 MG RE SUPP: 650.0000 mg | Freq: Four times a day (QID) | RECTAL | Status: DC | PRN

## 2017-04-07 MED ORDER — GLUCOSAMINE HCL 500 MG PO TABS: 500.0000 mg | ORAL_TABLET | Freq: Every day | ORAL | Status: DC

## 2017-04-07 MED ORDER — LEVOTHYROXINE SODIUM 88 MCG PO TABS
88.0000 ug | ORAL_TABLET | Freq: Every day | ORAL | Status: DC
  Administered 2017-04-08 – 2017-04-15 (×8): 88 ug via ORAL
  Filled 2017-04-07 (×9): qty 1

## 2017-04-07 MED ORDER — ACETAMINOPHEN 325 MG PO TABS
650.0000 mg | ORAL_TABLET | Freq: Four times a day (QID) | ORAL | Status: DC | PRN
Start: 2017-04-07 — End: 2017-04-15
  Administered 2017-04-09 – 2017-04-14 (×2): 650 mg via ORAL
  Filled 2017-04-07 (×3): qty 2

## 2017-04-07 MED ORDER — POLYETHYL GLYCOL-PROPYL GLYCOL 0.4-0.3 % OP GEL: Freq: Every day | OPHTHALMIC | Status: DC

## 2017-04-07 MED ORDER — METOPROLOL TARTRATE 25 MG PO TABS
12.5000 mg | ORAL_TABLET | Freq: Two times a day (BID) | ORAL | Status: DC
  Administered 2017-04-08 – 2017-04-15 (×13): 12.5 mg via ORAL
  Filled 2017-04-07 (×15): qty 1

## 2017-04-07 MED ORDER — TORSEMIDE 20 MG PO TABS
20.0000 mg | ORAL_TABLET | Freq: Every day | ORAL | Status: DC
  Administered 2017-04-08 – 2017-04-10 (×3): 20 mg via ORAL
  Filled 2017-04-07 (×3): qty 1

## 2017-04-07 MED ORDER — POTASSIUM CHLORIDE ER 10 MEQ PO TBCR
10.0000 meq | EXTENDED_RELEASE_TABLET | ORAL | Status: DC | PRN
  Filled 2017-04-07: qty 1

## 2017-04-07 MED ORDER — POLYVINYL ALCOHOL 1.4 % OP SOLN: 1.0000 [drp] | Freq: Every day | OPHTHALMIC | Status: DC | PRN

## 2017-04-07 MED ORDER — SODIUM CHLORIDE 0.9 % IV SOLN
INTRAVENOUS | Status: DC
  Administered 2017-04-08 – 2017-04-10 (×3): via INTRAVENOUS

## 2017-04-07 MED ORDER — MIDODRINE HCL 5 MG PO TABS
5.0000 mg | ORAL_TABLET | Freq: Two times a day (BID) | ORAL | Status: DC
  Administered 2017-04-07 – 2017-04-15 (×17): 5 mg via ORAL
  Filled 2017-04-07 (×17): qty 1

## 2017-04-07 MED ORDER — DOCUSATE SODIUM 100 MG PO CAPS
100.0000 mg | ORAL_CAPSULE | Freq: Every day | ORAL | Status: DC
  Administered 2017-04-08 – 2017-04-15 (×8): 100 mg via ORAL
  Filled 2017-04-07 (×8): qty 1

## 2017-04-07 MED ORDER — MIRTAZAPINE 15 MG PO TABS
15.0000 mg | ORAL_TABLET | Freq: Every day | ORAL | Status: DC
Start: 2017-04-07 — End: 2017-04-15
  Administered 2017-04-08 – 2017-04-14 (×7): 15 mg via ORAL
  Filled 2017-04-07 (×7): qty 1

## 2017-04-07 MED ORDER — PANTOPRAZOLE SODIUM 40 MG PO TBEC
40.0000 mg | DELAYED_RELEASE_TABLET | Freq: Every day | ORAL | Status: DC
  Administered 2017-04-08 – 2017-04-15 (×8): 40 mg via ORAL
  Filled 2017-04-07 (×8): qty 1

## 2017-04-07 MED ORDER — SODIUM CHLORIDE 0.9 % IV BOLUS (SEPSIS): 500.0000 mL | Freq: Once | INTRAVENOUS | Status: DC

## 2017-04-07 MED ORDER — CYANOCOBALAMIN 1000 MCG/ML IJ SOLN
1000.0000 ug | INTRAMUSCULAR | Status: DC
  Filled 2017-04-07: qty 1

## 2017-04-07 MED ORDER — SIMVASTATIN 20 MG PO TABS
10.0000 mg | ORAL_TABLET | Freq: Every evening | ORAL | Status: DC
  Administered 2017-04-07 – 2017-04-15 (×9): 10 mg via ORAL
  Filled 2017-04-07 (×10): qty 1

## 2017-04-07 MED ORDER — AZITHROMYCIN 250 MG PO TABS: ORAL_TABLET | ORAL | 0 refills | Status: DC

## 2017-04-07 NOTE — Patient Instructions (Signed)
Great to see you. Please take zpack as directed. Please reschedule your coumadin appointment for a week from now.  I will call you with your xray results.

## 2017-04-07 NOTE — ED Triage Notes (Signed)
Pt presents to ed via EMS from doctors office c/o fever 104.0 , congestion with productive cough. EMS gave 1000.g tylenol prior to arrival

## 2017-04-07 NOTE — Assessment & Plan Note (Signed)
Progressive cough, weakness and confusion along with increasing temperature and decreasing O2 sats.  Lung exam seems pretty consistent with what Mandy heard.  Will get CXR today. Start on zpack.  Advised very close follow up and to move up her next coumadin appointment. She does not have signs of volume overload (according to daughter some edema at baseline and weight stable). The patient indicates understanding of these issues and agrees with the plan.

## 2017-04-07 NOTE — Telephone Encounter (Signed)
Pharmacy called back and I let them know Dr.Aron's instructions.

## 2017-04-07 NOTE — Telephone Encounter (Signed)
I am aware of the interaction but okay to d/c rx as we are sending pt to the ER.

## 2017-04-07 NOTE — ED Notes (Signed)
Pt and family member given ginger ale. [place] per Admitting MD

## 2017-04-07 NOTE — ED Provider Notes (Addendum)
[facility] Emergency Department Provider Note  ____________________________________________   I have reviewed the triage vital signs and the nursing notes.   HISTORY  Chief Complaint Shortness of Breath; Cough; Fever (104); and Nasal Congestion    HPI [name] is a 83 y.o. female who presents today with a cough of a few days duration.It is productive. She did have a fever today her primary care doctor, she is not on oxygen, she was found to have diminished oxygen saturation upon arrival. She denies any nausea or vomiting or abdominal pain. She has no chest pain. Nothing makes symptoms better, nothing makes it worse, 3 days duration,     Past Medical History:  Diagnosis Date  . Allergy   . Anemia   . Arthritis    osteo  . CAD (coronary artery disease)    a. 2 DES, [facility], 2004; b. nuc 2013 septal dyssenergy 2/2 LBBB, nl LV fxn;  c. 06/2015 MV: EF 41%, septal HK (LBBB), no ischemia->Low risk.  . Chronic atrial fibrillation (HCC)    a. on Coumadin (CHA2DS2VASc = 6); b. Holter 06/2010, no brady, mild tachy.  . Chronic systolic CHF (congestive heart failure) ([place])    a. 03/2015 Echo: EF 30-35%;  b. 01/2016 Echo: EF 30-35%.  . Compression fracture of lumbar vertebra ([place]) 05-2008  . COPD (chronic obstructive pulmonary disease) ([place])   . Diverticulosis of colon   . Dyslipidemia   . Essential hypertension   . Family history of adverse reaction to anesthesia    daughter PONV  . GERD (gastroesophageal reflux disease)   . Hard of hearing   . Hematemesis    a. 03/2015.  [name] Hx of colonoscopy   . Hypothyroid   . Ischemic cardiomyopathy    a. 03/2015 Echo: EF 30-35%, ant, antsept HK, mod MR, mildly dil LA/RV, mod dil RA, mod-sev TR, PASP 45mmHg;  b. 01/2016 Echo: EF 30-35%, ant/antsept HK, mildly dil Ao root (3.4cm) and Asc Ao (3.5cm), mild [email or web address] bi-atrial enlargement, mild to mod TR. PASP 54mmHg.  [name] LBBB (left bundle branch block)   . Lung abnormality    Dr [name] 122/2011 no further w/u needed  . Mitral regurgitation   . Myocardial infarction [facility]) 2016   "mild heart attack"  . Nausea    Some nausea with medications, May, 2013,  . Nocturia   . OA (osteoarthritis)   . Orthostatic hypotension    June, 2014  . Osteoporosis   . Pleural effusion associated with pulmonary infection 03-2007  . Prolonged QT interval    when on sotolol in past  . Pulmonary hypertension ([place])    a. 03/2015 PASP 74mmHg by echo.  . Shingles 2014  . Thrombocytopenia ([place])   . TR (tricuspid regurgitation)    moderate, echo 2008  . Urinary frequency   . Vitamin B deficiency   . Warfarin anticoagulation   . Wears dentures   . Wears glasses   . Weight loss    August, 2012    Patient Active Problem List   Diagnosis Date Noted  . Cough 04/07/2017  . Wheezing 04/07/2017  . Muscle weakness (generalized)   . Unsteadiness on feet   . Influenza A   . Encounter for therapeutic drug monitoring 08/24/2016  . Long-term (current) use of anticoagulants 08/24/2016  . Arterial hypotension   . Adjustment disorder with mixed anxiety and depressed mood 06/09/2015  . Generalized weakness 05/12/2015  . Chronic systolic CHF (congestive heart failure) ([place]) 04/29/2015  .  Cardiomyopathy, ischemic   . Pulmonary hypertension ([place])   . Adult failure to thrive   . Chronic atrial fibrillation ([place])   . Unstable angina ([place]) 04/07/2015  . Venous stasis dermatitis 06/19/2014  . Orthostatic hypotension   . History of shingles 02/28/2013  . Nail fungus 01/10/2013  . Hyperlipidemia 07/09/2011  . Hearing loss 07/09/2011  . Weight loss   . A-fib ([place])   . CAD (coronary artery disease)   . Mitral regurgitation   . LBBB (left bundle branch block)   . Warfarin anticoagulation   . TR (tricuspid regurgitation)   . Dyslipidemia   . Lung abnormality   . Prolonged QT interval   . Coccydynia 12/03/2010  . Nonspecific (abnormal) findings on radiological and other examination of  body structure 07/13/2010  . ABNORMAL CHEST XRAY 07/13/2010  . Shortness of breath 06/18/2010  . Essential hypertension 06/24/2009  . GOUT, UNSPECIFIED 03/20/2009  . GERD 03/18/2009  . Thrombocytopenia ([place]) 01/03/2009  . ALLERGIC RHINITIS 12/05/2008  . HOARSENESS 06/07/2008  . MEDIASTINAL LYMPHADENOPATHY 05/13/2008  . Other diseases of lung, not elsewhere classified 04/18/2008  . Venous (peripheral) insufficiency 09/13/2007  . FREQUENCY, URINARY 07/19/2007  . TINNITUS NOS 05/11/2007  . OSTEOPOROSIS NOS 05/11/2007  . HYPOTHYROIDISM 04/11/2007  . ANEMIA, B12 DEFICIENCY 04/11/2007    Past Surgical History:  Procedure Laterality Date  . COLONOSCOPY    . CORONARY ANGIOPLASTY  2004  . LUMBAR LAMINECTOMY/DECOMPRESSION MICRODISCECTOMY N/A 03/23/2016   Procedure: Lumbar three-four Laminectomy/Foraminotomy, posterior laterial arthrodesis;  Surgeon: Leeroy Cha, MD;  Location: [facility] NEURO ORS;  Service: Neurosurgery;  Laterality: N/A;  L3-4 Laminectomy/Foraminotomy  . THYROIDECTOMY  1962  . TONSILLECTOMY      Prior to Admission medications   Medication Sig Start Date End Date Taking? Authorizing Provider  azithromycin (ZITHROMAX Z-PAK) 250 MG tablet Take 2 pills by mouth today and then 1 pill daily for 4 days 04/07/17   Lucille Passy, MD  calcium carbonate (OS-CAL) 600 MG TABS tablet Take 1,200 mg by mouth every evening.     [provider]  docusate sodium (COLACE) 100 MG capsule Take 100 mg by mouth daily.    [provider]  fluticasone (FLONASE) 50 MCG/ACT nasal spray 2 SPRAYS IN BOTH NOSTRILS DAILY 05/28/16   Tower, [place] A, MD  Glucosamine HCl 500 MG TABS Take 500 mg by mouth daily.     [provider]  levothyroxine (SYNTHROID, LEVOTHROID) 88 MCG tablet TAKE ONE TABLET BY MOUTH EVERY DAY 11/29/16   Tower, Wynelle Fanny, MD  metoprolol tartrate (LOPRESSOR) 25 MG tablet Take 0.5 tablets (12.5 mg total) by mouth 2 (two) times daily. Patient taking differently: Take  12.5 mg by mouth 2 (two) times daily. HOLD evening dose if BP is under 110 05/11/16   Gollan, Kathlene November, MD  midodrine (PROAMATINE) 5 MG tablet TAKE ONE TABLET BY MOUTH TWICE DAILY WITH A MEAL 01/16/16   Minna Merritts, MD  mirtazapine (REMERON) 15 MG tablet TAKE 1 TABLET BY MOUTH AT BEDTIME 11/29/16   Tower, Wynelle Fanny, MD  Multiple Vitamin (MULTIVITAMIN) tablet Take 1 tablet by mouth daily.     [provider]  multivitamin-lutein (OCUVITE-LUTEIN) CAPS capsule Take 1 capsule by mouth daily.    [provider]  pantoprazole (PROTONIX) 40 MG tablet TAKE ONE TABLET BY MOUTH EVERY DAY 11/29/16   Tower, Wynelle Fanny, MD  Polyethyl Glycol-Propyl Glycol (SYSTANE OP) Apply 1 drop to eye daily.    [provider]  potassium chloride (  K-DUR) 10 MEQ tablet Take 1 tablet (10 mEq total) by mouth as needed (take with lasix). 10/15/16 03/15/17  Minna Merritts, MD  simvastatin (ZOCOR) 20 MG tablet TAKE 1/2 TABLET BY MOUTH EVERY EVENING 12/29/16   Tower, Wynelle Fanny, MD  torsemide (DEMADEX) 20 MG tablet Take 1 tablet (20 mg total) by mouth daily. 03/15/17 04/14/17  Alisa Graff, FNP  warfarin (COUMADIN) 5 MG tablet Take 1 tablet (5 mg total) by mouth daily before supper. Take 1 - 2 pills daily AS DIRECTED BY ANTI-COAGULATION CLINIC 03/01/17   Tower, Wynelle Fanny, MD    Allergies Sulfonamide derivatives and Alendronate sodium  Family History  Problem Relation Age of Onset  . Heart disease Mother 57  . Stroke Father 56  . Heart disease Sister   . Cancer Brother        colon  . Diabetes Brother   . Leukemia Sister     Social History Social History  Substance Use Topics  . Smoking status: Former Smoker    Types: Cigarettes    Quit date: 09/14/1947  . Smokeless tobacco: Never Used  . Alcohol use No    Review of Systems Constitutional: No fever/chills Eyes: No visual changes. ENT: No sore throat. No stiff neck no neck pain Cardiovascular: Denies chest pain. Respiratory: Positive cough and  shortness of breath. Gastrointestinal:   no vomiting.  No diarrhea.  No constipation. Genitourinary: Negative for dysuria. Musculoskeletal: Negative lower extremity swelling Skin: Negative for rash. Neurological: Negative for severe headaches, focal weakness or numbness.   ____________________________________________   PHYSICAL EXAM:  VITAL SIGNS: ED Triage Vitals  Enc Vitals Group     BP 04/07/17 1331 (!) 111/56     Pulse Rate 04/07/17 1331 86     Resp 04/07/17 1331 18     Temp 04/07/17 1331 99.3 F (37.4 C)     Temp Source 04/07/17 1331 Oral     SpO2 04/07/17 1331 93 %     Weight 04/07/17 1332 121 lb (54.9 kg)     Height 04/07/17 1332 5\' 7"  (1.702 m)     Head Circumference --      Peak Flow --      Pain Score --      Pain Loc --      Pain Edu? --      Excl. in [place]? --     Constitutional: Alert and oriented. Well appearing and in no acute distress. Eyes: Conjunctivae are normal Head: Atraumatic HEENT: No congestion/rhinnorhea. Mucous membranes are moist.  Oropharynx non-erythematous Neck:   Nontender with no meningismus, no masses, no stridor Cardiovascular: Normal rate, regular rhythm. Grossly normal heart sounds.  Good peripheral circulation. Respiratory: Normal respiratory effort.  No retractions. Positive mild rhonchi in the right base Abdominal: Soft and nontender. No distention. No guarding no rebound Back:  There is no focal tenderness or step off.  there is no midline tenderness there are no lesions noted. there is no CVA tenderness Musculoskeletal: No lower extremity tenderness, no upper extremity tenderness. No joint effusions, no DVT signs strong distal pulses no edema Neurologic:  Normal speech and language. No gross focal neurologic deficits are appreciated.  Skin:  Skin is warm, dry and intact. No rash noted. Psychiatric: Mood and affect are normal. Speech and behavior are normal.  ____________________________________________   LABS (all labs ordered  are listed, but only abnormal results are displayed)  Labs Reviewed  CBC WITH DIFFERENTIAL/PLATELET - Abnormal; Notable for the following:  Result Value   RBC 3.61 (*)    Hemoglobin 11.4 (*)    HCT 33.9 (*)    Platelets 126 (*)    Neutro Abs 8.0 (*)    Monocytes Absolute 1.9 (*)    All other components within normal limits  COMPREHENSIVE METABOLIC PANEL - Abnormal; Notable for the following:    Glucose, Bld 129 (*)    BUN 44 (*)    Creatinine, Ser 1.80 (*)    Total Bilirubin 1.4 (*)    GFR calc non Af Amer 23 (*)    GFR calc Af Amer 27 (*)    All other components within normal limits  CULTURE, BLOOD (ROUTINE X 2)  CULTURE, BLOOD (ROUTINE X 2)  BRAIN NATRIURETIC PEPTIDE  TROPONIN I   ____________________________________________  EKG  I personally interpreted any EKGs ordered by me or triage Atrial fibrillation, likely left bundle-branch block, no acute ischemia, Known left bundle branch block ____________________________________________  RADIOLOGY  I reviewed any imaging ordered by me or triage that were performed during my shift and, if possible, patient and/or family made aware of any abnormal findings. ____________________________________________   PROCEDURES  Procedure(s) performed: None  Procedures  Critical Care performed: None  ____________________________________________   INITIAL IMPRESSION / ASSESSMENT AND PLAN / ED COURSE  Pertinent labs & imaging results that were available during my care of the patient were reviewed by me and considered in my medical decision making (see chart for details).  Patient here with fever, cough, And new oxygen requirement consistent with pneumonia. White count is not markedly elevated, however, there is an infiltrate on chest x-ray. Given that she lives in an nursing home with multiple different other sick patients we'll treat her as a poker pneumonia. Pneumonia. She does not appear to be septic. We will give her  ginger IV fluids as he does have some degree of CHF at baseline. We will give her broad-spectrum antibiotic and admit.   ----------------------------------------- 3:43 PM on 04/07/2017 -----------------------------------------  Troponin noted, unlikely to be primary ischemia given fever cough etc., more likely to be demand ischemia, hospitalist aware ____________________________________________   FINAL CLINICAL IMPRESSION(S) / ED DIAGNOSES  Final diagnoses:  None      This chart was dictated using voice recognition software.  Despite best efforts to proofread,  errors can occur which can change meaning.      Schuyler Amor, MD 04/07/17 1428    Schuyler Amor, MD 04/07/17 [id number]

## 2017-04-07 NOTE — H&P (Signed)
[name] is an 83 y.o. female.   Chief Complaint: Cough HPI: This is a 83 year old female who's a resident of nursing home. For the past 4 days she's been having cough this been productive she's also complained of a little chest tightness. This is been feeling weak and this brought her in today. She also noted some dark urine today.  Past Medical History:  Diagnosis Date  . Allergy   . Anemia   . Arthritis    osteo  . CAD (coronary artery disease)    a. 2 DES, [facility], 2004; b. nuc 2013 septal dyssenergy 2/2 LBBB, nl LV fxn;  c. 06/2015 MV: EF 41%, septal HK (LBBB), no ischemia->Low risk.  . Chronic atrial fibrillation (HCC)    a. on Coumadin (CHA2DS2VASc = 6); b. Holter 06/2010, no brady, mild tachy.  . Chronic systolic CHF (congestive heart failure) ([place])    a. 03/2015 Echo: EF 30-35%;  b. 01/2016 Echo: EF 30-35%.  . Compression fracture of lumbar vertebra ([place]) 05-2008  . COPD (chronic obstructive pulmonary disease) ([place])   . Diverticulosis of colon   . Dyslipidemia   . Essential hypertension   . Family history of adverse reaction to anesthesia    daughter PONV  . GERD (gastroesophageal reflux disease)   . Hard of hearing   . Hematemesis    a. 03/2015.  [name] Hx of colonoscopy   . Hypothyroid   . Ischemic cardiomyopathy    a. 03/2015 Echo: EF 30-35%, ant, antsept HK, mod MR, mildly dil LA/RV, mod dil RA, mod-sev TR, PASP 14mHg;  b. 01/2016 Echo: EF 30-35%, ant/antsept HK, mildly dil Ao root (3.4cm) and Asc Ao (3.5cm), mild [email or web address] enlargement, mild to mod TR. PASP 319mg.  . Marland KitchenBBB (left bundle branch block)   . Lung abnormality    Dr ClGwenette Greet22/2011 no further w/u needed  . Mitral regurgitation   . [facility]   "[facility]"  . Nausea    Some nausea with medications, May, 2013,  . Nocturia   . OA (osteoarthritis)   . Orthostatic hypotension    June, 2014  . Osteoporosis   . Pleural effusion associated with pulmonary infection 03-2007  .  Prolonged QT interval    when on sotolol in past  . [place]monary hypertension ([place]   a. 03/2015 PASP 5738m by echo.  . Shingles 2014  . Thrombocytopenia ([place] . TR (tricuspid regurgitation)    moderate, echo 2008  . Urinary frequency   . Vitamin B deficiency   . Warfarin anticoagulation   . Wears dentures   . Wears glasses   . Weight loss    August, 2012    Past Surgical History:  Procedure Laterality Date  . COLONOSCOPY    . CORONARY ANGIOPLASTY  2004  . LUMBAR LAMINECTOMY/DECOMPRESSION MICRODISCECTOMY N/A 03/23/2016   Procedure: Lumbar three-four Laminectomy/Foraminotomy, posterior laterial arthrodesis;  Surgeon: ErnLeeroy ChaD;  [facility];  Service: Neurosurgery;  Laterality: N/A;  L3-4 Laminectomy/Foraminotomy  . THYROIDECTOMY  1962  . TONSILLECTOMY      Family History  Problem Relation Age of Onset  . Heart disease Mother 76 17 Stroke Father 82 23 Heart disease Sister   . Cancer Brother        colon  . Diabetes Brother   . Leukemia Sister    Social History:  reports that she quit smoking about 69 years ago. Her smoking use included Cigarettes. She has never  used smokeless tobacco. She reports that she does not drink alcohol or use drugs.  Allergies:  Allergies  Allergen Reactions  . Sulfonamide Derivatives Nausea And Vomiting    REACTION: sick  . Alendronate Sodium Other (See Comments)    REACTION: GI     (Not in a hospital admission)  Results for orders placed or performed during the hospital encounter of 04/07/17 (from the past 48 hour(s))  CBC with Differential     Status: Abnormal   Collection Time: 04/07/17  1:39 PM  Result Value Ref Range   WBC 10.9 3.6 - 11.0 K/uL   RBC 3.61 (L) 3.80 - 5.20 MIL/uL   Hemoglobin 11.4 (L) 12.0 - 16.0 g/dL   HCT 33.9 (L) 35.0 - 47.0 %   MCV 93.7 80.0 - 100.0 fL   MCH 31.4 26.0 - 34.0 pg   MCHC 33.5 32.0 - 36.0 g/dL   RDW 14.3 11.5 - 14.5 %   Platelets 126 (L) 150 - 440 K/uL   Neutrophils  Relative % 73 %   Neutro Abs 8.0 (H) 1.4 - 6.5 K/uL   Lymphocytes Relative 9 %   Lymphs Abs 1.0 1.0 - 3.6 K/uL   Monocytes Relative 18 %   Monocytes Absolute 1.9 (H) 0.2 - 0.9 K/uL   Eosinophils Relative 0 %   Eosinophils Absolute 0.0 0 - 0.7 K/uL   Basophils Relative 0 %   Basophils Absolute 0.0 0 - 0.1 K/uL  Comprehensive metabolic panel     Status: Abnormal   Collection Time: 04/07/17  1:39 PM  Result Value Ref Range   Sodium 137 135 - 145 mmol/L   Potassium 4.5 3.5 - 5.1 mmol/L   Chloride 104 101 - 111 mmol/L   CO2 24 22 - 32 mmol/L   Glucose, Bld 129 (H) 65 - 99 mg/dL   BUN 44 (H) 6 - 20 mg/dL   Creatinine, Ser 1.80 (H) 0.44 - 1.00 mg/dL   Calcium 8.9 8.9 - 10.3 mg/dL   Total Protein 6.7 6.5 - 8.1 g/dL   Albumin 3.7 3.5 - 5.0 g/dL   AST 32 15 - 41 U/L   ALT 17 14 - 54 U/L   Alkaline Phosphatase 67 38 - 126 U/L   Total Bilirubin 1.4 (H) 0.3 - 1.2 mg/dL   GFR calc non Af Amer 23 (L) >60 mL/min   GFR calc Af Amer 27 (L) >60 mL/min    Comment: (NOTE) The eGFR has been calculated using the CKD EPI equation. This calculation has not been validated in all clinical situations. eGFR's persistently <60 mL/min signify possible Chronic Kidney Disease.    Anion gap 9 5 - 15  Protime-INR     Status: Abnormal   Collection Time: 04/07/17  1:39 PM  Result Value Ref Range   Prothrombin Time 35.9 (H) 11.4 - 15.2 seconds   INR 3.49    Dg Chest 2 View  Result Date: 04/07/2017 CLINICAL DATA:  Productive cough for 1 week EXAM: CHEST  2 VIEW COMPARISON:  10/12/2016 FINDINGS: Cardiac shadow remains enlarged. Changes of prior vertebral augmentation are again seen. The lungs are well-aerated with bibasilar infiltrative changes worse on the right than the left. Previously seen effusions have resolved. Diffuse interstitial changes are again noted. No acute bony abnormality is seen. IMPRESSION: New right basilar infiltrate when compared with the prior exam. Stable left basilar changes are seen.  Electronically Signed   By: Inez Catalina M.D.   On: 04/07/2017 14:21  Review of Systems  Constitutional: Negative for fever.  HENT: Negative for hearing loss.   Eyes: Negative for blurred vision.  Respiratory: Positive for cough.   Cardiovascular: Positive for chest pain.  Gastrointestinal: Negative for nausea and vomiting.  Genitourinary: Negative for dysuria.  Musculoskeletal: Negative for myalgias.  Skin: Negative for rash.  Neurological: Negative for dizziness.  Psychiatric/Behavioral: Negative for depression.    Blood pressure (!) 93/56, pulse 88, temperature 99.3 F (37.4 C), temperature source Oral, resp. rate 18, height 5' 7"  (1.702 m), weight 54.9 kg (121 lb), SpO2 92 %. Physical Exam  Constitutional: She is oriented to person, place, and time. She appears well-developed and well-nourished. No distress.  HENT:  Head: Normocephalic and atraumatic.  Mouth/Throat: Oropharynx is clear and moist. No oropharyngeal exudate.  Eyes: Pupils are equal, round, and reactive to light. Conjunctivae are normal. No scleral icterus.  Neck: Neck supple. No JVD present. No tracheal deviation present. No thyromegaly present.  Cardiovascular: Normal rate, regular rhythm and normal heart sounds.   Respiratory: Breath sounds normal. No respiratory distress. She has no wheezes. She has no rales.  Scattered rhonchi bilaterally. No use of accessory muscles.  GI: Soft. Bowel sounds are normal. She exhibits no distension. There is no tenderness. There is no rebound and no guarding.  Musculoskeletal: She exhibits no edema or tenderness.  Neurological: She is alert and oriented to person, place, and time. No cranial nerve deficit. Coordination normal.  Skin: Skin is warm and dry.     Assessment/Plan 1. Pneumonia. She is a resident of care facility so this may be healthcare acquired pneumonia. We'll go ahead and put her on cefepime and vancomycin. Cultures are pending. 2. Acute kidney injury. Likely  secondary to decreased by mouth intake. We'll monitor renal function and give fluid gently. 3. Atrial fibrillation. Rate is controlled. She is on Coumadin for anticoagulation. To monitor INR closely since his LDL antibiotics. 4. Ischemic cardiomyopathy. No cardiac issues at this time. We'll have to watch fluid balance carefully as not to fluid overload her. Next line 5. COPD. Continue current medications. CODE STATUS. DO NOT RESUSCITATE next line total time spent 45 minutes  Baxter Hire, MD 04/07/2017, 3:07 PM

## 2017-04-07 NOTE — Progress Notes (Signed)
As patient was being transported for CXR, Leticia Penna approached me and said that she looks much weaker than she did when she saw her two days ago.  Because of this, advised daughter to take [name] to ER for imaging abx, fluids ( needs to be done cautiously due to CHF).  She agreed.  CXR canceled.

## 2017-04-07 NOTE — Progress Notes (Signed)
ANTICOAGULATION CONSULT NOTE - Initial Consult  Pharmacy Consult for warfarin Indication: atrial fibrillation  Allergies  Allergen Reactions  . Sulfonamide Derivatives Nausea And Vomiting    REACTION: sick  . Alendronate Sodium Other (See Comments)    REACTION: GI   Patient Measurements: Height: 5\' 7"  (170.2 cm) Weight: 121 lb (54.9 kg) IBW/kg (Calculated) : 61.6  Vital Signs: Temp: 98.1 F (36.7 C) (07/26 1713) Temp Source: Axillary (07/26 1545) BP: 101/58 (07/26 1713) Pulse Rate: 81 (07/26 1713)  Labs:  Recent Labs  04/05/17 04/07/17 1339  HGB  --  11.4*  HCT  --  33.9*  PLT  --  126*  LABPROT  --  35.9*  INR 2.8 3.49  CREATININE  --  1.80*  TROPONINI  --  0.09*    Estimated Creatinine Clearance: 16.9 mL/min (A) (by C-G formula based on SCr of 1.8 mg/dL (H)).  Assessment: Pharmacy consulted to dose and monitor warfarin in this 83 year old female taking warfarin PTA for atrial fibrillation.   Home dose: warfarin 5 mg PO Tuesday, Friday, Sunday           Warfarin 7.5 mg PO Mon, Wed, Thurs, Sat Last dose PTA: 7/25 INR = 3.49 on admission  Goal of Therapy:  INR 2-3  Plan:  Will hold warfarin tonight for elevated INR.  Will recheck INR with AM labs tomorrow.  Lenis Noon, PharmD Clinical Pharmacist 04/07/2017,5:52 PM

## 2017-04-07 NOTE — Progress Notes (Signed)
Subjective:   Patient ID: [name], female    DOB: 06-16-1922, 83 y.o.   MRN: [id number]  [name] is a pleasant 83 y.o. year old female patient of Dr. Glori Bickers, new to me, with h/o CHF, who presents to clinic today with Cough and Wheezing  on 04/07/2017  HPI:  While seeing Leticia Penna, RN on 04/05/17- complained of 3 days of increasing cough and congestion. Note reviwed.  At that time, she had some intermittent cough, worse at night, productive of yellow sputum with some "throat drainage."  She is at an ALF and has been exposed to several URIs.  Suggested that she take Mucinex DM and tylenol and follow up here if symptoms progressed.  Today, she and her daughter feel she is worse.  She is weaker, more confused.  More congestion.  Has not had a fever at the ALF.  Current Outpatient Prescriptions on File Prior to Visit  Medication Sig Dispense Refill  . calcium carbonate (OS-CAL) 600 MG TABS tablet Take 1,200 mg by mouth every evening.     . docusate sodium (COLACE) 100 MG capsule Take 100 mg by mouth daily.    . fluticasone (FLONASE) 50 MCG/ACT nasal spray 2 SPRAYS IN BOTH NOSTRILS DAILY 16 g 5  . Glucosamine HCl 500 MG TABS Take 500 mg by mouth daily.     [name] levothyroxine (SYNTHROID, LEVOTHROID) 88 MCG tablet TAKE ONE TABLET BY MOUTH EVERY DAY 90 tablet 1  . metoprolol tartrate (LOPRESSOR) 25 MG tablet Take 0.5 tablets (12.5 mg total) by mouth 2 (two) times daily. (Patient taking differently: Take 12.5 mg by mouth 2 (two) times daily. HOLD evening dose if BP is under 110) 180 tablet 3  . midodrine (PROAMATINE) 5 MG tablet TAKE ONE TABLET BY MOUTH TWICE DAILY WITH A MEAL 180 tablet 3  . mirtazapine (REMERON) 15 MG tablet TAKE 1 TABLET BY MOUTH AT BEDTIME 90 tablet 1  . Multiple Vitamin (MULTIVITAMIN) tablet Take 1 tablet by mouth daily.     . multivitamin-lutein (OCUVITE-LUTEIN) CAPS capsule Take 1 capsule by mouth daily.    . pantoprazole (PROTONIX) 40 MG tablet TAKE  ONE TABLET BY MOUTH EVERY DAY 90 tablet 1  . Polyethyl Glycol-Propyl Glycol (SYSTANE OP) Apply 1 drop to eye daily.    . simvastatin (ZOCOR) 20 MG tablet TAKE 1/2 TABLET BY MOUTH EVERY EVENING 45 tablet 1  . torsemide (DEMADEX) 20 MG tablet Take 1 tablet (20 mg total) by mouth daily. 30 tablet 5  . warfarin (COUMADIN) 5 MG tablet Take 1 tablet (5 mg total) by mouth daily before supper. Take 1 - 2 pills daily AS DIRECTED BY ANTI-COAGULATION CLINIC 180 tablet 0  . potassium chloride (K-DUR) 10 MEQ tablet Take 1 tablet (10 mEq total) by mouth as needed (take with lasix). 90 tablet 3   Current Facility-Administered Medications on File Prior to Visit  Medication Dose Route Frequency Provider Last Rate Last Dose  . cyanocobalamin ((VITAMIN B-12)) injection 1,000 mcg  1,000 mcg Intramuscular Q30 days Tower, Roque Lias A, MD   1,000 mcg at 04/05/17 1100    Allergies  Allergen Reactions  . Sulfonamide Derivatives Nausea And Vomiting    REACTION: sick  . Alendronate Sodium Other (See Comments)    REACTION: GI    Past Medical History:  Diagnosis Date  . Allergy   . Anemia   . Arthritis    osteo  . CAD (coronary artery disease)    a. 2 DES, [facility],  2004; b. nuc 2013 septal dyssenergy 2/2 LBBB, nl LV fxn;  c. 06/2015 MV: EF 41%, septal HK (LBBB), no ischemia->Low risk.  . Chronic atrial fibrillation (HCC)    a. on Coumadin (CHA2DS2VASc = 6); b. Holter 06/2010, no brady, mild tachy.  . Chronic systolic CHF (congestive heart failure) ([place])    a. 03/2015 Echo: EF 30-35%;  b. 01/2016 Echo: EF 30-35%.  . Compression fracture of lumbar vertebra ([place]) 05-2008  . COPD (chronic obstructive pulmonary disease) ([place])   . Diverticulosis of colon   . Dyslipidemia   . Essential hypertension   . Family history of adverse reaction to anesthesia    daughter PONV  . GERD (gastroesophageal reflux disease)   . Hard of hearing   . Hematemesis    a. 03/2015.  [name] Hx of colonoscopy   . Hypothyroid   . Ischemic  cardiomyopathy    a. 03/2015 Echo: EF 30-35%, ant, antsept HK, mod MR, mildly dil LA/RV, mod dil RA, mod-sev TR, PASP 13mmHg;  b. 01/2016 Echo: EF 30-35%, ant/antsept HK, mildly dil Ao root (3.4cm) and Asc Ao (3.5cm), mild [email or web address] bi-atrial enlargement, mild to mod TR. PASP 19mmHg.  [name] LBBB (left bundle branch block)   . Lung abnormality    Dr [name] 122/2011 no further w/u needed  . Mitral regurgitation   . Myocardial infarction [facility]) 2016   "mild heart attack"  . Nausea    Some nausea with medications, May, 2013,  . Nocturia   . OA (osteoarthritis)   . Orthostatic hypotension    June, 2014  . Osteoporosis   . Pleural effusion associated with pulmonary infection 03-2007  . Prolonged QT interval    when on sotolol in past  . Pulmonary hypertension ([place])    a. 03/2015 PASP 57mmHg by echo.  . Shingles 2014  . Thrombocytopenia ([place])   . TR (tricuspid regurgitation)    moderate, echo 2008  . Urinary frequency   . Vitamin B deficiency   . Warfarin anticoagulation   . Wears dentures   . Wears glasses   . Weight loss    August, 2012    Past Surgical History:  Procedure Laterality Date  . COLONOSCOPY    . CORONARY ANGIOPLASTY  2004  . LUMBAR LAMINECTOMY/DECOMPRESSION MICRODISCECTOMY N/A 03/23/2016   Procedure: Lumbar three-four Laminectomy/Foraminotomy, posterior laterial arthrodesis;  Surgeon: Leeroy Cha, MD;  Location: [facility] NEURO ORS;  Service: Neurosurgery;  Laterality: N/A;  L3-4 Laminectomy/Foraminotomy  . THYROIDECTOMY  1962  . TONSILLECTOMY      Family History  Problem Relation Age of Onset  . Heart disease Mother 3  . Stroke Father 40  . Heart disease Sister   . Cancer Brother        colon  . Diabetes Brother   . Leukemia Sister     Social History   Social History  . Marital status: Widowed    Spouse name: N/A  . Number of children: N/A  . Years of education: N/A   Occupational History  . Not on file.   Social History Main Topics  . Smoking status:  Former Smoker    Types: Cigarettes    Quit date: 09/14/1947  . Smokeless tobacco: Never Used  . Alcohol use No  . Drug use: No  . Sexual activity: Not Currently   Other Topics Concern  . Not on file   Social History Narrative   Very independent    Lives in [place] by herself  The PMH, PSH, Social History, Family History, Medications, and allergies have been reviewed in [facility], and have been updated if relevant.   Review of Systems  Constitutional: Negative.   HENT: Positive for congestion. Negative for dental problem, drooling, ear discharge, nosebleeds, sinus pressure, sneezing, tinnitus, trouble swallowing and voice change.   Respiratory: Positive for cough, shortness of breath and wheezing. Negative for apnea, choking and chest tightness.   Psychiatric/Behavioral: Positive for confusion.  All other systems reviewed and are negative.      Objective:    BP 100/70   Pulse 88   Temp 99.2 F (37.3 C)   Ht 5\' 7"  (1.702 m)   Wt 121 lb (54.9 kg)   SpO2 93%   BMI 18.95 kg/m   Wt Readings from Last 3 Encounters:  04/07/17 121 lb (54.9 kg)  03/15/17 122 lb 6 oz (55.5 kg)  12/30/16 123 lb 6 oz (56 kg)    Physical Exam  Constitutional: She appears well-developed.  HENT:  Head: Normocephalic and atraumatic.  Eyes: Conjunctivae are normal.  Cardiovascular: Normal rate and regular rhythm.   Pulmonary/Chest: She has decreased breath sounds in the right lower field and the left lower field.  Scattered crackles  Musculoskeletal: She exhibits edema.  Skin: Skin is warm and dry.  Psychiatric: She has a normal mood and affect. Her behavior is normal. Judgment and thought content normal.  Nursing note and vitals reviewed.         Assessment & Plan:   Cough - Plan: DG Chest 2 View  Wheezing - Plan: DG Chest 2 View No Follow-up on file.

## 2017-04-07 NOTE — ED Notes (Signed)
Admitting at bedside 

## 2017-04-07 NOTE — Telephone Encounter (Signed)
Sherry at [facility] left v/m; pt was prescribed Zpak and pt is on warfarin and there may be a drug interaction; do you want pt counseled or change med. Sherry request cb.

## 2017-04-07 NOTE — Progress Notes (Signed)
Pharmacy Antibiotic Note  [name] is a 83 y.o. female admitted on 04/07/2017 with pneumonia.  Pharmacy has been consulted for vancomycin and cefepime dosing.  Plan: Cefepime 1 g IV given in ED. Will order another 1 g IV to be given now and cefepime 2 g IV daily beginning tomorrow.  Vancomycin 1000 mg IV given in ED. Patient is in AKI so will dose off of randoms until renal function stabilizes.  SCr in Feb 2018 = 1.2, SCr 1.8 on admission. Will recheck tomorrow morning.  MRSA PCR ordered. Recommend to d/c vancomycin if negative.  Height: 5\' 7"  (170.2 cm) Weight: 121 lb (54.9 kg) IBW/kg (Calculated) : 61.6  Temp (24hrs), Avg:98.8 F (37.1 C), Min:98.1 F (36.7 C), Max:99.3 F (37.4 C)   Recent Labs Lab 04/07/17 1339  WBC 10.9  CREATININE 1.80*    Estimated Creatinine Clearance: 16.9 mL/min (A) (by C-G formula based on SCr of 1.8 mg/dL (H)).    Allergies  Allergen Reactions  . Sulfonamide Derivatives Nausea And Vomiting    REACTION: sick  . Alendronate Sodium Other (See Comments)    REACTION: GI    Antimicrobials this admission: vancomycin 7/26 >>  cefepime 7/26 >>   Dose adjustments this admission:  Microbiology results: 7/26 BCx: Sent 7/26 UCx: Sent  7/26 MRSA PCR: Sent  Thank you for allowing pharmacy to be a part of this patient's care.  Lenis Noon, PharmD Clinical Pharmacist 04/07/2017 7:18 PM

## 2017-04-07 NOTE — Progress Notes (Signed)
PHARMACIST - PHYSICIAN ORDER COMMUNICATION  CONCERNING: P&T Medication Policy on Herbal Medications  DESCRIPTION:  This patient's order for: Glucosamine  has been noted.  This product(s) is classified as an "herbal" or natural product. Due to a lack of definitive safety studies or FDA approval, nonstandard manufacturing practices, plus the potential risk of unknown drug-drug interactions while on inpatient medications, the Pharmacy and Therapeutics Committee does not permit the use of "herbal" or natural products of this type within [place].   ACTION TAKEN: The pharmacy department is unable to verify this order at this time and your patient has been informed of this safety policy. Please reevaluate patient's clinical condition at discharge and address if the herbal or natural product(s) should be resumed at that time.  

## 2017-04-07 NOTE — ED Notes (Signed)
Date and time results received: 04/07/17 3:23 PM (use smartphrase ".now" to insert current time)  Test: Troponin Critical Value: 0.09  Name of Provider Notified: Dr. Burlene Arnt  Orders Received? Or Actions Taken?: MD aware

## 2017-04-08 ENCOUNTER — Ambulatory Visit: Payer: PPO

## 2017-04-08 DIAGNOSIS — L8990 Pressure ulcer of unspecified site, unspecified stage: Secondary | ICD-10-CM | POA: Insufficient documentation

## 2017-04-08 LAB — CBC
HCT: 32 % — ABNORMAL LOW (ref 35.0–47.0)
Hemoglobin: 10.6 g/dL — ABNORMAL LOW (ref 12.0–16.0)
MCH: 31 pg (ref 26.0–34.0)
MCHC: 33.1 g/dL (ref 32.0–36.0)
MCV: 93.7 fL (ref 80.0–100.0)
PLATELETS: 115 10*3/uL — AB (ref 150–440)
RBC: 3.41 MIL/uL — AB (ref 3.80–5.20)
RDW: 14.3 % (ref 11.5–14.5)
WBC: 10.1 10*3/uL (ref 3.6–11.0)

## 2017-04-08 LAB — BASIC METABOLIC PANEL
Anion gap: 8 (ref 5–15)
BUN: 47 mg/dL — ABNORMAL HIGH (ref 6–20)
CHLORIDE: 106 mmol/L (ref 101–111)
CO2: 24 mmol/L (ref 22–32)
CREATININE: 1.68 mg/dL — AB (ref 0.44–1.00)
Calcium: 8.3 mg/dL — ABNORMAL LOW (ref 8.9–10.3)
GFR, EST AFRICAN AMERICAN: 29 mL/min — AB (ref >60)
GFR, EST NON AFRICAN AMERICAN: 25 mL/min — AB (ref >60)
Glucose, Bld: 94 mg/dL (ref 65–99)
Potassium: 4.4 mmol/L (ref 3.5–5.1)
SODIUM: 138 mmol/L (ref 135–145)

## 2017-04-08 LAB — URINALYSIS, COMPLETE (UACMP) WITH MICROSCOPIC
BILIRUBIN URINE: NEGATIVE
Bacteria, UA: NONE SEEN
Glucose, UA: NEGATIVE mg/dL
Hgb urine dipstick: NEGATIVE
KETONES UR: NEGATIVE mg/dL
Nitrite: NEGATIVE
PROTEIN: 30 mg/dL — AB
Specific Gravity, Urine: 1.018 (ref 1.005–1.030)
pH: 5 (ref 5.0–8.0)

## 2017-04-08 LAB — PROTIME-INR
INR: 4.31 — AB
PROTHROMBIN TIME: 42.5 s — AB (ref 11.4–15.2)

## 2017-04-08 MED ORDER — GUAIFENESIN-DM 100-10 MG/5ML PO SYRP
5.0000 mL | ORAL_SOLUTION | ORAL | Status: DC | PRN
  Administered 2017-04-08 – 2017-04-11 (×11): 5 mL via ORAL
  Filled 2017-04-08 (×12): qty 5

## 2017-04-08 NOTE — Clinical Social Work Note (Signed)
CSW consulted for pt as she was admitted from [facility]. No CSW intervention needed at this time. RNCM updated. CSW is signing off as no further needs identified. Please reconsult if a need arises prior to discharge.   Darden Dates, MSW, LCSW  Clinical Social Worker  [phone]

## 2017-04-08 NOTE — Progress Notes (Signed)
Pharmacy Antibiotic Note  [name] is a 83 y.o. female admitted on 04/07/2017 with pneumonia.  Pharmacy has been consulted for vancomycin and cefepime dosing. Vancomycin d/c'd 7/27.  Plan: Continue cefepime 2 g IV daily.   Height: 5\' 7"  (170.2 cm) Weight: 121 lb (54.9 kg) IBW/kg (Calculated) : 61.6  Temp (24hrs), Avg:98.2 F (36.8 C), Min:97.6 F (36.4 C), Max:98.5 F (36.9 C)   Recent Labs Lab 04/07/17 1339 04/08/17 0535  WBC 10.9 10.1  CREATININE 1.80* 1.68*    Estimated Creatinine Clearance: 18.1 mL/min (A) (by C-G formula based on SCr of 1.68 mg/dL (H)).    Allergies  Allergen Reactions  . Sulfonamide Derivatives Nausea And Vomiting    REACTION: sick  . Alendronate Sodium Other (See Comments)    REACTION: GI    Antimicrobials this admission: vancomycin 7/26 >> 7/27 cefepime 7/26 >>   Dose adjustments this admission:  Microbiology results: 7/26 BCx: NGTD 7/26 UCx: Sent  7/26 MRSA PCR: neg  Thank you for allowing pharmacy to be a part of this patient's care.  Rocky Morel, PharmD Clinical Pharmacist 04/08/2017 2:41 PM

## 2017-04-08 NOTE — Progress Notes (Signed)
[facility] NAME: [name]    MR#:  [id number]  DATE OF BIRTH:  30-Apr-1922  SUBJECTIVE:  CHIEF COMPLAINT:   Chief Complaint  Patient presents with  . Shortness of Breath  . Cough  . Fever    104  . Nasal Congestion    [facility] with a private paid home health aide every morning. Able to walk with a walker and does not have any memory issues. For last 2-3 days she appeared having some confusion which is gradually getting worse and yesterday had more weakness and shaking so finally brought to emergency room where she was noted to have pneumonia and acute renal failure.  Feels little better Today, the renal function is slightly improved. REVIEW OF SYSTEMS:  CONSTITUTIONAL: No fever,positive for fatigue or weakness.  EYES: No blurred or double vision.  EARS, NOSE, AND THROAT: No tinnitus or ear pain.  RESPIRATORY: positive for cough, shortness of breath,no wheezing or hemoptysis.  CARDIOVASCULAR: No chest pain, orthopnea, edema.  GASTROINTESTINAL: No nausea, vomiting, diarrhea or abdominal pain.  GENITOURINARY: No dysuria, hematuria.  ENDOCRINE: No polyuria, nocturia,  HEMATOLOGY: No anemia, easy bruising or bleeding SKIN: No rash or lesion. MUSCULOSKELETAL: No joint pain or arthritis.   NEUROLOGIC: No tingling, numbness, weakness.  PSYCHIATRY: No anxiety or depression.   ROS  DRUG ALLERGIES:   Allergies  Allergen Reactions  . Sulfonamide Derivatives Nausea And Vomiting    REACTION: sick  . Alendronate Sodium Other (See Comments)    REACTION: GI    VITALS:  Blood pressure 112/65, pulse (!) 102, temperature 98.4 F (36.9 C), temperature source Oral, resp. rate 16, height 5\' 7"  (1.702 m), weight 54.9 kg (121 lb), SpO2 96 %.  PHYSICAL EXAMINATION:  GENERAL:  83 y.o.-year-old patient lying in the bed with no acute distress.  EYES: Pupils equal, round, reactive to light and accommodation. No  scleral icterus. Extraocular muscles intact.  HEENT: Head atraumatic, normocephalic. Oropharynx and nasopharynx clear.  NECK:  Supple, no jugular venous distention. No thyroid enlargement, no tenderness.  LUNGS: Normal breath sounds bilaterally, no wheezing, have crepitation. No use of accessory muscles of respiration.  CARDIOVASCULAR: S1, S2 normal. systolic murmurs, visible venous pulsatioons on JVD on neck.  ABDOMEN: Soft, nontender, nondistended. Bowel sounds present. No organomegaly or mass.  EXTREMITIES: No pedal edema, cyanosis, or clubbing. Have eye ecchymosis on right leg due to recent injury and being on Coumadin. NEUROLOGIC: Cranial nerves II through XII are intact. Muscle strength 4/5 in all extremities. Sensation intact. Gait not checked.  PSYCHIATRIC: The patient is alert and oriented x 3.  SKIN: No obvious rash, lesion, or ulcer.   Physical Exam LABORATORY PANEL:   CBC  Recent Labs Lab 04/08/17 0535  WBC 10.1  HGB 10.6*  HCT 32.0*  PLT 115*   ------------------------------------------------------------------------------------------------------------------  Chemistries   Recent Labs Lab 04/07/17 1339 04/08/17 0535  NA 137 138  K 4.5 4.4  CL 104 106  CO2 24 24  GLUCOSE 129* 94  BUN 44* 47*  CREATININE 1.80* 1.68*  CALCIUM 8.9 8.3*  AST 32  --   ALT 17  --   ALKPHOS 67  --   BILITOT 1.4*  --    ------------------------------------------------------------------------------------------------------------------  Cardiac Enzymes  Recent Labs Lab 04/07/17 1339  TROPONINI 0.09*   ------------------------------------------------------------------------------------------------------------------  RADIOLOGY:  Dg Chest 2 View  Result Date: 04/07/2017 CLINICAL DATA:  Productive cough for 1 week EXAM: CHEST  2  VIEW COMPARISON:  10/12/2016 FINDINGS: Cardiac shadow remains enlarged. Changes of prior vertebral augmentation are again seen. The lungs are  well-aerated with bibasilar infiltrative changes worse on the right than the left. Previously seen effusions have resolved. Diffuse interstitial changes are again noted. No acute bony abnormality is seen. IMPRESSION: New right basilar infiltrate when compared with the prior exam. Stable left basilar changes are seen. Electronically Signed   By: Inez Catalina M.D.   On: 04/07/2017 14:21    ASSESSMENT AND PLAN:   Active Problems:   Pneumonia   Pressure injury of skin  1. Health Care Associated Pneumonia.   on cefepime and vancomycin. Cultures are pending.   MRSA screening is negative so vancomycin was DC'd. 2. Acute kidney injury. Likely secondary to decreased by mouth intake.    monitor renal function and give fluid gently. 3. Atrial fibrillation. Rate is controlled. She is on Coumadin for anticoagulation. To monitor INR closely  4. Ischemic cardiomyopathy. With chronic diastolic congestive heart failure, EF 35%.  No cardiac issues at this time.   watch fluid balance carefully as not to fluid overload her.   5. COPD. Continue current medications.    All the records are reviewed and case discussed with Care Management/Social Workerr. Management plans discussed with the patient, family and they are in agreement.  CODE STATUS: DNR  TOTAL TIME TAKING CARE OF THIS PATIENT: 35 minutes.    POSSIBLE D/C IN 1-2 DAYS, DEPENDING ON CLINICAL CONDITION.   Vaughan Basta M.D on 04/08/2017   Between 7am to 6pm - Pager - [phone]  After 6pm go to [email or web address] - password [email or web address] [facility]  Office  [phone]  CC: Primary care physician; Tower, Wynelle Fanny, MD  Note: This dictation was prepared with Dragon dictation along with smaller phrase technology. Any transcriptional errors that result from this process are unintentional.

## 2017-04-08 NOTE — Progress Notes (Signed)
ANTICOAGULATION CONSULT NOTE - Follow up [facility] for warfarin Indication: atrial fibrillation  Allergies  Allergen Reactions  . Sulfonamide Derivatives Nausea And Vomiting    REACTION: sick  . Alendronate Sodium Other (See Comments)    REACTION: GI   Patient Measurements: Height: 5\' 7"  (170.2 cm) Weight: 121 lb (54.9 kg) IBW/kg (Calculated) : 61.6  Vital Signs: Temp: 98 F (36.7 C) (07/27 0425) Temp Source: Oral (07/27 0425) BP: 96/48 (07/27 0828) Pulse Rate: 90 (07/27 0828)  Labs:  Recent Labs  04/07/17 1339 04/08/17 0535  HGB 11.4* 10.6*  HCT 33.9* 32.0*  PLT 126* 115*  LABPROT 35.9* 42.5*  INR 3.49 4.31*  CREATININE 1.80* 1.68*  TROPONINI 0.09*  --     Estimated Creatinine Clearance: 18.1 mL/min (A) (by C-G formula based on SCr of 1.68 mg/dL (H)).  Assessment: Pharmacy consulted to dose and monitor warfarin in this 83 year old female taking warfarin PTA for atrial fibrillation.   Home dose: warfarin 5 mg PO Tuesday, Friday, Sunday           Warfarin 7.5 mg PO Mon, Wed, Thurs, Sat Last dose PTA: 7/25 7/26  INR = 3.49 on admission 7/27 INR 4.31  Goal of Therapy:  INR 2-3  Plan:  Will hold warfarin again tonight for elevated INR.  Will recheck INR with AM labs tomorrow. Will need CBC at least every three days.  Per RN, no s/sx of bleeding noted. Plan to hold warfarin today discussed with RN.  Pharmacy will continue to follow.  Rocky Morel, PharmD Clinical Pharmacist 04/08/2017,10:02 AM

## 2017-04-08 NOTE — Progress Notes (Signed)
INR level 4.31. Pharmacy made aware. No active coumadin orders. No interventions.

## 2017-04-09 LAB — URINE CULTURE: CULTURE: NO GROWTH

## 2017-04-09 LAB — HEPATIC FUNCTION PANEL
ALBUMIN: 2.8 g/dL — AB (ref 3.5–5.0)
ALK PHOS: 71 U/L (ref 38–126)
ALT: 19 U/L (ref 14–54)
AST: 34 U/L (ref 15–41)
BILIRUBIN TOTAL: 0.9 mg/dL (ref 0.3–1.2)
Bilirubin, Direct: 0.3 mg/dL (ref 0.1–0.5)
Indirect Bilirubin: 0.6 mg/dL (ref 0.3–0.9)
TOTAL PROTEIN: 5.2 g/dL — AB (ref 6.5–8.1)

## 2017-04-09 LAB — BASIC METABOLIC PANEL
ANION GAP: 5 (ref 5–15)
BUN: 39 mg/dL — ABNORMAL HIGH (ref 6–20)
CALCIUM: 7.8 mg/dL — AB (ref 8.9–10.3)
CO2: 22 mmol/L (ref 22–32)
Chloride: 110 mmol/L (ref 101–111)
Creatinine, Ser: 1.54 mg/dL — ABNORMAL HIGH (ref 0.44–1.00)
GFR calc Af Amer: 32 mL/min — ABNORMAL LOW (ref >60)
GFR calc non Af Amer: 28 mL/min — ABNORMAL LOW (ref >60)
GLUCOSE: 98 mg/dL (ref 65–99)
Potassium: 4 mmol/L (ref 3.5–5.1)
Sodium: 137 mmol/L (ref 135–145)

## 2017-04-09 LAB — PROTIME-INR
INR: 3.22
PROTHROMBIN TIME: 33.6 s — AB (ref 11.4–15.2)

## 2017-04-09 MED ORDER — POLYVINYL ALCOHOL 1.4 % OP SOLN
1.0000 [drp] | Freq: Every day | OPHTHALMIC | Status: DC
  Administered 2017-04-09 – 2017-04-15 (×7): 1 [drp] via OPHTHALMIC
  Filled 2017-04-09: qty 15

## 2017-04-09 MED ORDER — IPRATROPIUM-ALBUTEROL 0.5-2.5 (3) MG/3ML IN SOLN
3.0000 mL | Freq: Four times a day (QID) | RESPIRATORY_TRACT | Status: DC
  Administered 2017-04-09 – 2017-04-10 (×5): 3 mL via RESPIRATORY_TRACT
  Filled 2017-04-09 (×5): qty 3

## 2017-04-09 MED ORDER — WARFARIN - PHARMACIST DOSING INPATIENT
Freq: Every day | Status: DC
  Administered 2017-04-09 – 2017-04-14 (×4)

## 2017-04-09 MED ORDER — WARFARIN SODIUM 5 MG PO TABS
5.0000 mg | ORAL_TABLET | Freq: Once | ORAL | Status: AC
  Administered 2017-04-09: 17:00:00 5 mg via ORAL
  Filled 2017-04-09: qty 1

## 2017-04-09 MED ORDER — NYSTATIN 100000 UNIT/ML MT SUSP
5.0000 mL | Freq: Four times a day (QID) | OROMUCOSAL | Status: DC
  Administered 2017-04-09 – 2017-04-15 (×24): 500000 [IU] via ORAL
  Filled 2017-04-09 (×24): qty 5

## 2017-04-09 NOTE — Evaluation (Signed)
Physical Therapy Evaluation Patient Details Name: [name] MRN: [id number] DOB: 09/29/1921 Today's Date: 04/09/2017   History of Present Illness  Pt is a 83 y.o. female presenting to hospital with productive cough and fever.  Pt admitted with PNA and AKI.  PMH includes OA, CAD, chronic a-fib, systolic CHF, compression fx L-spine, COPD, LBBB, MI, orthostatic hypotension.  Clinical Impression  Prior to hospital admission, pt was modified independent ambulating with rollator.  Pt lives at [place] with home health aide services.  Currently pt is SBA supine to sit, SBA to CGA with transfers, and CGA ambulating 40 feet with RW.  Pt's O2 sats 91-96% on 2 L O2 via nasal cannula and HR 88-111 bpm during session.  Pt would benefit from skilled PT to address noted impairments and functional limitations (see below for any additional details).  Upon hospital discharge, recommend pt discharge back to [place] with [place].    Follow Up Recommendations Home health PT;Supervision - Intermittent    Equipment Recommendations  Rolling walker with 5" wheels    Recommendations for Other Services       Precautions / Restrictions Precautions Precautions: Fall Restrictions Weight Bearing Restrictions: No      Mobility  Bed Mobility Overal bed mobility: Needs Assistance Bed Mobility: Supine to Sit     Supine to sit: Supervision;HOB elevated     General bed mobility comments: increased effort and time to perform  Transfers Overall transfer level: Needs assistance Equipment used: Rolling walker (2 wheeled) Transfers: Sit to/from Stand Sit to Stand: Supervision;Min guard         General transfer comment: fairly strong stand; steady  Ambulation/Gait Ambulation/Gait assistance: Min guard Ambulation Distance (Feet): 40 Feet Assistive device: Rolling walker (2 wheeled)   Gait velocity: decreased   General Gait Details: decreased B step length  Stairs             Wheelchair Mobility    Modified Rankin (Stroke Patients Only)       Balance Overall balance assessment: Needs assistance Sitting-balance support: No upper extremity supported;Feet supported Sitting balance-Leahy Scale: Good Sitting balance - Comments: sitting reaching within BOS   Standing balance support: Bilateral upper extremity supported Standing balance-Leahy Scale: Poor Standing balance comment: good balance ambulating with UE support on RW (unsteady without UE support)                             Pertinent Vitals/Pain Pain Assessment: No/denies pain    Home Living Family/patient expects to be discharged to:: Private residence Living Arrangements: Alone Available Help at Discharge: Family;Available PRN/intermittently;Personal care attendant Type of Home: Independent living facility ([facility]) Home Access: Level entry     Home Layout: One [place]: Grab bars - tub/shower;Grab bars - toilet;Shower seat;Bedside commode;Walker - 4 wheels Additional Comments: Pt has private home health aide every morning to assist with bathing, dressing, and breakfast.  Walks to dining hall for lunch and dinner.  Pt denies any falls in past 6 months.  Daughter and aide assists with cleaning and laundry.    Prior Function Level of Independence: Independent with assistive device(s)         Comments: Ambulates with rollator.     Hand Dominance        Extremity/Trunk Assessment   Upper Extremity Assessment Upper Extremity Assessment: Generalized weakness    Lower Extremity Assessment Lower Extremity Assessment: Generalized weakness  Communication   Communication: HOH  Cognition Arousal/Alertness: Awake/alert Behavior During Therapy: WFL for tasks assessed/performed Overall Cognitive Status: Within Functional Limits for tasks assessed                                        General Comments  General comments (skin integrity, edema, etc.): Pt's daughter present for session.  Nursing aware of dried blood from pt's nose.  Nursing cleared pt for participation in physical therapy.  Pt agreeable to PT session.    Exercises  Functional mobility   Assessment/Plan    PT Assessment Patient needs continued PT services  PT Problem List Decreased strength;Decreased activity tolerance;Decreased balance;Decreased mobility       PT Treatment Interventions DME instruction;Gait training;Functional mobility training;Therapeutic activities;Therapeutic exercise;Balance training;Patient/family education    PT Goals (Current goals can be found in the Care Plan section)  Acute Rehab PT Goals Patient Stated Goal: to feel better PT Goal Formulation: With patient Time For Goal Achievement: 04/23/17 Potential to Achieve Goals: Good    Frequency Min 2X/week   Barriers to discharge        Co-evaluation               AM-PAC PT "6 Clicks" Daily Activity  Outcome Measure Difficulty turning over in bed (including adjusting bedclothes, sheets and blankets)?: A Little Difficulty moving from lying on back to sitting on the side of the bed? : A Little Difficulty sitting down on and standing up from a chair with arms (e.g., wheelchair, bedside commode, etc,.)?: A Little Help needed moving to and from a bed to chair (including a wheelchair)?: A Little Help needed walking in hospital room?: A Little Help needed climbing 3-5 steps with a railing? : A Little 6 Click Score: 18    End of Session Equipment Utilized During Treatment: Gait belt;Oxygen (2 L O2) Activity Tolerance: Patient limited by fatigue Patient left: in chair;with call bell/phone within reach;with family/visitor present;Other (comment) (B heels elevated via pillow) Nurse Communication: Mobility status;Precautions PT Visit Diagnosis: Muscle weakness (generalized) (M62.81);Difficulty in walking, not elsewhere classified (R26.2)     Time: 1027-2536 PT Time Calculation (min) (ACUTE ONLY): 42 min   Charges:   PT Evaluation $PT Eval Low Complexity: 1 Procedure PT Treatments $Therapeutic Activity: 8-22 mins   PT G Codes:   PT G-Codes **NOT FOR INPATIENT CLASS** Functional Assessment Tool Used: AM-PAC 6 Clicks Basic Mobility Functional Limitation: Mobility: Walking and moving around Mobility: Walking and Moving Around Current Status (U4403): At least 40 percent but less than 60 percent impaired, limited or restricted Mobility: Walking and Moving Around Goal Status [phone]): At least 1 percent but less than 20 percent impaired, limited or restricted    Leitha Bleak, PT 04/09/17, 11:24 AM [phone]

## 2017-04-09 NOTE — Progress Notes (Signed)
[facility] at [place] NAME: [name]    MR#:  [id number]  DATE OF BIRTH:  02/10/1922  SUBJECTIVE:   Patient here due to shortness of breath, cough congestion noted to have pneumonia. Still having significant cough with productive sputum and feels very weak. Daughter is at bedside.  REVIEW OF SYSTEMS:    Review of Systems  Constitutional: Negative for chills and fever.  HENT: Negative for congestion and tinnitus.   Eyes: Negative for blurred vision and double vision.  Respiratory: Positive for cough and shortness of breath. Negative for wheezing.   Cardiovascular: Negative for chest pain, orthopnea and PND.  Gastrointestinal: Negative for abdominal pain, diarrhea, nausea and vomiting.  Genitourinary: Negative for dysuria and hematuria.  Neurological: Positive for weakness. Negative for dizziness, sensory change and focal weakness.  All other systems reviewed and are negative.   Nutrition: Dysphagia II, nectar thick liquids.  Tolerating Diet: Yes Tolerating PT: Eval noted.   DRUG ALLERGIES:   Allergies  Allergen Reactions  . Sulfonamide Derivatives Nausea And Vomiting    REACTION: sick  . Alendronate Sodium Other (See Comments)    REACTION: GI    VITALS:  Blood pressure (!) 102/54, pulse 99, temperature 98.5 F (36.9 C), temperature source Oral, resp. rate 18, height 5\' 7"  (1.702 m), weight 54.9 kg (121 lb), SpO2 96 %.  PHYSICAL EXAMINATION:   Physical Exam  GENERAL:  83 y.o.-year-old patient lying in chair in mild resp. Distress.  EYES: Pupils equal, round, reactive to light and accommodation. No scleral icterus. Extraocular muscles intact.  HEENT: Head atraumatic, normocephalic. Oropharynx and nasopharynx clear.  NECK:  Supple, no jugular venous distention. No thyroid enlargement, no tenderness.  LUNGS: Poor Resp. effort, no wheezing, rales, rhonchi. No use of accessory muscles of respiration.  CARDIOVASCULAR: S1, S2  normal. No murmurs, rubs, or gallops.  ABDOMEN: Soft, nontender, nondistended. Bowel sounds present. No organomegaly or mass.  EXTREMITIES: No cyanosis, clubbing or edema b/l.    NEUROLOGIC: Cranial nerves II through XII are intact. No focal Motor or sensory deficits b/l.   PSYCHIATRIC: The patient is alert and oriented x 3.  SKIN: No obvious rash, lesion, or ulcer.    LABORATORY PANEL:   CBC  Recent Labs Lab 04/08/17 0535  WBC 10.1  HGB 10.6*  HCT 32.0*  PLT 115*   ------------------------------------------------------------------------------------------------------------------  Chemistries   Recent Labs Lab 04/07/17 1339  04/09/17 0503  NA 137  < > 137  K 4.5  < > 4.0  CL 104  < > 110  CO2 24  < > 22  GLUCOSE 129*  < > 98  BUN 44*  < > 39*  CREATININE 1.80*  < > 1.54*  CALCIUM 8.9  < > 7.8*  AST 32  --   --   ALT 17  --   --   ALKPHOS 67  --   --   BILITOT 1.4*  --   --   < > = values in this interval not displayed. ------------------------------------------------------------------------------------------------------------------  Cardiac Enzymes  Recent Labs Lab 04/07/17 1339  TROPONINI 0.09*   ------------------------------------------------------------------------------------------------------------------  RADIOLOGY:  Dg Chest 2 View  Result Date: 04/07/2017 CLINICAL DATA:  Productive cough for 1 week EXAM: CHEST  2 VIEW COMPARISON:  10/12/2016 FINDINGS: Cardiac shadow remains enlarged. Changes of prior vertebral augmentation are again seen. The lungs are well-aerated with bibasilar infiltrative changes worse on the right than the left. Previously seen effusions have resolved. Diffuse interstitial  changes are again noted. No acute bony abnormality is seen. IMPRESSION: New right basilar infiltrate when compared with the prior exam. Stable left basilar changes are seen. Electronically Signed   By: Inez Catalina M.D.   On: 04/07/2017 14:21     ASSESSMENT AND  PLAN:   83 year old female with past medical history of hypertension, COPD, osteoarthritis, chronic atrial fibrillation, history of coronary artery disease, hyperlipidemia, GERD, previous history of MI who presented to the hospital due to shortness of breath, cough.  1. Pneumonia-this is the cause of patient's shortness of breath cough and respiratory distress. -Continue IV cefepime, follow up on sputum and blood cultures which are negative so far. -Afebrile, white cell count is improving. Continue antitussives and supportive care.  2. Acute kidney injury-secondary to dehydration. Creatinine is slowly improving. Continue IV fluids. Renal dose meds, avoid nephrotoxins.  3. Hypothyroidism-continue Synthroid.  4. Essential hypertension-continue metoprolol.  5. Hyperlipidemia-continue simvastatin.  6. GERD-continue Protonix.  7. History of chronic atrial fibrillation-rate controlled. Continue metoprolol. Continue Coumadin, INR therapeutic presently.  All the records are reviewed and case discussed with Care Management/Social Worker. Management plans discussed with the patient, family and they are in agreement.  CODE STATUS: DNR  DVT Prophylaxis: Warfarin  TOTAL TIME TAKING CARE OF THIS PATIENT: 30 minutes.   POSSIBLE D/C IN 1-2 DAYS, DEPENDING ON CLINICAL CONDITION.   [name] M.D on 04/09/2017 at 11:31 AM  Between 7am to 6pm - Pager - [phone]  After 6pm go to [email or web address] - [facility]  Office  [phone]  CC: Primary care physician; Tower, Wynelle Fanny, MD

## 2017-04-09 NOTE — Evaluation (Signed)
Clinical/Bedside Swallow Evaluation Patient Details  Name: [name] MRN: [id number] Date of Birth: 1921-11-14  Today's Date: 04/09/2017 Time: SLP Start Time (ACUTE ONLY): 45 SLP Stop Time (ACUTE ONLY): 1132 SLP Time Calculation (min) (ACUTE ONLY): 29 min  Past Medical History:  Past Medical History:  Diagnosis Date  . Allergy   . Anemia   . Arthritis    osteo  . CAD (coronary artery disease)    a. 2 DES, [facility], 2004; b. nuc 2013 septal dyssenergy 2/2 LBBB, nl LV fxn;  c. 06/2015 MV: EF 41%, septal HK (LBBB), no ischemia->Low risk.  . Chronic atrial fibrillation (HCC)    a. on Coumadin (CHA2DS2VASc = 6); b. Holter 06/2010, no brady, mild tachy.  . Chronic systolic CHF (congestive heart failure) ([place])    a. 03/2015 Echo: EF 30-35%;  b. 01/2016 Echo: EF 30-35%.  . Compression fracture of lumbar vertebra ([facility]) 05-2008  . COPD (chronic obstructive pulmonary disease) ([place])   . Diverticulosis of colon   . Dyslipidemia   . Essential hypertension   . Family history of adverse reaction to anesthesia    daughter PONV  . GERD (gastroesophageal reflux disease)   . Hard of hearing   . Hematemesis    a. 03/2015.  [name] Hx of colonoscopy   . Hypothyroid   . Ischemic cardiomyopathy    a. 03/2015 Echo: EF 30-35%, ant, antsept HK, mod MR, mildly dil LA/RV, mod dil RA, mod-sev TR, PASP 28mmHg;  b. 01/2016 Echo: EF 30-35%, ant/antsept HK, mildly dil Ao root (3.4cm) and Asc Ao (3.5cm), mild [email or web address] bi-atrial enlargement, mild to mod TR. PASP 48mmHg.  [name] LBBB (left bundle branch block)   . Lung abnormality    Dr [name] 122/2011 no further w/u needed  . Mitral regurgitation   . Myocardial infarction [facility]) 2016   "mild heart attack"  . Nausea    Some nausea with medications, May, 2013,  . Nocturia   . OA (osteoarthritis)   . Orthostatic hypotension    June, 2014  . Osteoporosis   . Pleural effusion associated with pulmonary infection 03-2007  . Prolonged QT interval    when on sotolol in past   . Pulmonary hypertension ([place])    a. 03/2015 PASP 38mmHg by echo.  . Shingles 2014  . Thrombocytopenia ([place])   . TR (tricuspid regurgitation)    moderate, echo 2008  . Urinary frequency   . Vitamin B deficiency   . Warfarin anticoagulation   . Wears dentures   . Wears glasses   . Weight loss    August, 2012   Past Surgical History:  Past Surgical History:  Procedure Laterality Date  . COLONOSCOPY    . CORONARY ANGIOPLASTY  2004  . LUMBAR LAMINECTOMY/DECOMPRESSION MICRODISCECTOMY N/A 03/23/2016   Procedure: Lumbar three-four Laminectomy/Foraminotomy, posterior laterial arthrodesis;  Surgeon: Leeroy Cha, MD;  [facility] NEURO [facility]S;  Service: Neurosurgery;  Laterality: N/A;  L3-4 Laminectomy/Foraminotomy  . THYROIDECTOMY  1962  . TONSILLECTOMY     HPI:      Assessment / Plan / Recommendation Clinical Impression  pt presents with a moderate oral pharyngeal dysphagia characterized by reduced lingual movements during oral phase increased a-p transit and throat clear and cough post sips of thin liquid via cup ans straw. pt reports she has previously seen a speech therapist for dysphagia and is not supposed to use straws and is to use chin tuck for effective  airway protection. pt was noted to have straws in drinks present  in room prior to st enterance. pt attempted chin tuck on occasion with slp without cues, however st guarded on pt completion without st present.  with chin tuck occurance of throat clear and cough was reduced, however due to suspicion for lack of compensatory strategy use on current pna, slp recommends to downgrade to nectar thick liquids to reduce risk of aspiration. pt and family in agreement with plan.  SLP Visit Diagnosis: Dysphagia, oropharyngeal phase (R13.12)    Aspiration Risk  Moderate aspiration risk    Diet Recommendation Dysphagia 2 (Fine chop);Nectar-thick liquid   Liquid Administration via: Cup;Spoon;No straw Medication Administration: Crushed with  puree Supervision: Patient able to self feed Compensations: Slow rate;Minimize environmental distractions;Small sips/bites;Follow solids with liquid Postural Changes: Seated upright at 90 degrees    Other  Recommendations Oral Care Recommendations: Oral care BID Other Recommendations: Order thickener from pharmacy;Remove water pitcher   Follow up Recommendations        Frequency and Duration min 4x/week  2 weeks       Prognosis Prognosis for Safe Diet Advancement: Fair Barriers to Reach Goals: Time post onset;Severity of deficits      Swallow Study   General Date of Onset: 04/08/17 Type of Study: Bedside Swallow Evaluation Diet Prior to this Study: Regular;Thin liquids Temperature Spikes Noted: No Respiratory Status: Nasal cannula History of Recent Intubation: No Behavior/Cognition: Alert;Cooperative;Pleasant mood Oral Cavity Assessment: Within Functional Limits Oral Care Completed by SLP: No Oral Cavity - Dentition: Adequate natural dentition Vision: Functional for self-feeding Self-Feeding Abilities: Able to feed self Patient Positioning: Upright in chair Baseline Vocal Quality: Breathy;Low vocal intensity;Hoarse Volitional Cough: Weak Volitional Swallow: Able to elicit    Oral/Motor/Sensory Function Overall Oral Motor/Sensory Function: Within functional limits   Ice Chips Ice chips: Not tested   Thin Liquid Thin Liquid: Impaired Oral Phase Impairments: Reduced lingual movement/coordination Pharyngeal  Phase Impairments: Throat Clearing - Immediate;Cough - Immediate    Nectar Thick Nectar Thick Liquid: Within functional limits Presentation: Cup   Honey Thick Honey Thick Liquid: Within functional limits Presentation: Cup   Puree Puree: Within functional limits Presentation: Spoon   Solid   GO   Solid: Within functional limits Presentation: Jessy Oto 04/09/2017,12:31 PM

## 2017-04-09 NOTE — Progress Notes (Signed)
Red, yellow, purple arm bands placed.

## 2017-04-09 NOTE — Progress Notes (Signed)
ANTICOAGULATION CONSULT NOTE - Follow up [place] [facility] for warfarin Indication: atrial fibrillation  Allergies  Allergen Reactions  . Sulfonamide Derivatives Nausea And Vomiting    REACTION: sick  . Alendronate Sodium Other (See Comments)    REACTION: GI   Patient Measurements: Height: 5\' 7"  (170.2 cm) Weight: 121 lb (54.9 kg) IBW/kg (Calculated) : 61.6  Vital Signs: Temp: 98.5 F (36.9 C) (07/28 0917) Temp Source: Oral (07/28 0917) BP: 102/54 (07/28 0917) Pulse Rate: 99 (07/28 0917)  Labs:  Recent Labs  04/07/17 1339 04/08/17 0535 04/09/17 0503  HGB 11.4* 10.6*  --   HCT 33.9* 32.0*  --   PLT 126* 115*  --   LABPROT 35.9* 42.5* 33.6*  INR 3.49 4.31* 3.22  CREATININE 1.80* 1.68* 1.54*  TROPONINI 0.09*  --   --     Estimated Creatinine Clearance: 19.8 mL/min (A) (by C-G formula based on SCr of 1.54 mg/dL (H)).  Assessment: Pharmacy consulted to dose and monitor warfarin in this 83 year old female taking warfarin PTA for atrial fibrillation.   Home dose: warfarin 5 mg PO Tuesday, Friday, Sunday           Warfarin 7.5 mg PO Mon, Wed, Thurs, Sat Last dose PTA: 7/25 7/26  INR = 3.49 on admission 7/27 INR 4.31; held 7/28     INR= 3.22; 5 mg   Goal of Therapy:  INR 2-3  Plan:  INR slightly above goal range. Will give warfarin 5 mg PO x 1 today. INR likely to be lower tomorrow due to held dose of warfarin on 7/27  Will recheck INR with AM labs tomorrow. Will need CBC at least every three days.   Pharmacy will continue to follow.  Larene Beach, PharmD Clinical Pharmacist 04/09/2017,11:40 AM

## 2017-04-10 ENCOUNTER — Inpatient Hospital Stay: Payer: PPO

## 2017-04-10 LAB — PROTIME-INR
INR: 3.31
PROTHROMBIN TIME: 34.4 s — AB (ref 11.4–15.2)

## 2017-04-10 MED ORDER — WARFARIN SODIUM 2.5 MG PO TABS: 2.5000 mg | ORAL_TABLET | Freq: Once | ORAL | Status: DC

## 2017-04-10 MED ORDER — FUROSEMIDE 10 MG/ML IJ SOLN
20.0000 mg | Freq: Once | INTRAMUSCULAR | Status: AC
  Administered 2017-04-10: 20 mg via INTRAVENOUS
  Filled 2017-04-10: qty 2

## 2017-04-10 NOTE — Progress Notes (Addendum)
ANTICOAGULATION CONSULT NOTE - Follow up [place] [facility] for warfarin Indication: atrial fibrillation  Allergies  Allergen Reactions  . Sulfonamide Derivatives Nausea And Vomiting    REACTION: sick  . Alendronate Sodium Other (See Comments)    REACTION: GI   Patient Measurements: Height: 5\' 7"  (170.2 cm) Weight: 121 lb (54.9 kg) IBW/kg (Calculated) : 61.6  Vital Signs: Temp: 97.8 F (36.6 C) (07/29 0541) BP: 107/65 (07/29 0541) Pulse Rate: 82 (07/29 0541)  Labs:  Recent Labs  04/07/17 1339 04/08/17 0535 04/09/17 0503 04/10/17 0520  HGB 11.4* 10.6*  --   --   HCT 33.9* 32.0*  --   --   PLT 126* 115*  --   --   LABPROT 35.9* 42.5* 33.6* 34.4*  INR 3.49 4.31* 3.22 3.31  CREATININE 1.80* 1.68* 1.54*  --   TROPONINI 0.09*  --   --   --     Estimated Creatinine Clearance: 19.8 mL/min (A) (by C-G formula based on SCr of 1.54 mg/dL (H)).  Assessment: Pharmacy consulted to dose and monitor warfarin in this 83 year old female taking warfarin PTA for atrial fibrillation.   Home dose: warfarin 5 mg PO Tuesday, Friday, Sunday           Warfarin 7.5 mg PO Mon, Wed, Thurs, Sat Last dose PTA: 7/25 7/26  INR = 3.49 on admission 7/27 INR 4.31; held 7/28     INR= 3.22; 5 mg  7/29     INR= 3.31  Goal of Therapy:  INR 2-3  Plan:  INR slightly above goal range. Will hold warfarin. Will recheck INR with AM labs tomorrow. Will need CBC at least every three days.   Pharmacy will continue to follow.  Larene Beach, PharmD Clinical Pharmacist 04/10/2017,8:37 AM

## 2017-04-10 NOTE — Progress Notes (Signed)
[facility] [place] NAME: [name]    MR#:  [id number]  DATE OF BIRTH:  1921/12/02  SUBJECTIVE:   Patient here due to shortness of breath, cough congestion noted to have pneumonia. Still having a cough and feels weak. Seen by PT and they recommend home with Home Health.  CXR showing b/l opacities ?? Pneumonia/fluids.   REVIEW OF SYSTEMS:    Review of Systems  Constitutional: Negative for chills and fever.  HENT: Negative for congestion and tinnitus.   Eyes: Negative for blurred vision and double vision.  Respiratory: Positive for cough and shortness of breath. Negative for wheezing.   Cardiovascular: Negative for chest pain, orthopnea and PND.  Gastrointestinal: Negative for abdominal pain, diarrhea, nausea and vomiting.  Genitourinary: Negative for dysuria and hematuria.  Neurological: Positive for weakness. Negative for dizziness, sensory change and focal weakness.  All other systems reviewed and are negative.   Nutrition: Dysphagia II, nectar thick liquids.  Tolerating Diet: Yes Tolerating PT: Eval noted.   DRUG ALLERGIES:   Allergies  Allergen Reactions  . Sulfonamide Derivatives Nausea And Vomiting    REACTION: sick  . Alendronate Sodium Other (See Comments)    REACTION: GI    VITALS:  Blood pressure (!) 107/56, pulse 98, temperature 97.8 F (36.6 C), temperature source Oral, resp. rate 16, height 5\' 7"  (1.702 m), weight 54.9 kg (121 lb), SpO2 93 %.  PHYSICAL EXAMINATION:   Physical Exam  GENERAL:  83 y.o.-year-old patient lying in bed in mild resp. Distress.  EYES: Pupils equal, round, reactive to light and accommodation. No scleral icterus. Extraocular muscles intact.  HEENT: Head atraumatic, normocephalic. Oropharynx and nasopharynx clear.  NECK:  Supple, no jugular venous distention. No thyroid enlargement, no tenderness.  LUNGS: Poor Resp. effort, no wheezing, rales, rhonchi. No use of accessory muscles of  respiration.  CARDIOVASCULAR: S1, S2 normal. No murmurs, rubs, or gallops.  ABDOMEN: Soft, nontender, nondistended. Bowel sounds present. No organomegaly or mass.  EXTREMITIES: No cyanosis, clubbing or edema b/l.    NEUROLOGIC: Cranial nerves II through XII are intact. No focal Motor or sensory deficits b/l.   PSYCHIATRIC: The patient is alert and oriented x 3.  SKIN: No obvious rash, lesion, or ulcer.    LABORATORY PANEL:   CBC  Recent Labs Lab 04/08/17 0535  WBC 10.1  HGB 10.6*  HCT 32.0*  PLT 115*   ------------------------------------------------------------------------------------------------------------------  Chemistries   Recent Labs Lab 04/09/17 0503  NA 137  K 4.0  CL 110  CO2 22  GLUCOSE 98  BUN 39*  CREATININE 1.54*  CALCIUM 7.8*  AST 34  ALT 19  ALKPHOS 71  BILITOT 0.9   ------------------------------------------------------------------------------------------------------------------  Cardiac Enzymes  Recent Labs Lab 04/07/17 1339  TROPONINI 0.09*   ------------------------------------------------------------------------------------------------------------------  RADIOLOGY:  Dg Chest Port 1 View  Result Date: 04/10/2017 CLINICAL DATA:  Pneumonia.  Productive cough. EXAM: PORTABLE CHEST 1 VIEW COMPARISON:  04/07/2017 FINDINGS: The cardiac silhouette remains moderately enlarged. Aortic atherosclerosis is noted. Diffuse interstitial prominence is stable to mildly increased. Patchy right basilar airspace opacity has mildly increased. There is also increasing left basilar airspace opacity with small pleural effusions, left larger than right and also likely slightly increased in size. Hazy opacity is present in the right midlung. No pneumothorax is identified. Prior augmentation of the thoracic spine compression fracture is noted. IMPRESSION: Cardiomegaly with diffuse interstitial densities and worsening bibasilar airspace opacities and pleural effusions.  This may reflect edema or  pneumonia. Electronically Signed   By: Logan Bores M.D.   On: 04/10/2017 10:10     ASSESSMENT AND PLAN:   83 year old female with past medical history of hypertension, COPD, osteoarthritis, chronic atrial fibrillation, history of coronary artery disease, hyperlipidemia, GERD, previous history of MI who presented to the hospital due to shortness of breath, cough.  1. Acute respiratory failure with hypoxia-suspected be secondary to pneumonia. -Continue IV antibiotics incentive spirometry and antitussives. Still having significant shortness of breath therefore chest x-ray obtained today which is showing bilateral opacities consistent with edema/pneumonia. -We'll give 1 dose of IV Lasix and follow response.  2. Pneumonia-continue IV cefepime, follow blood, sputum cultures. -Continue antitussives and supportive care.  3. CHF-acute on chronic systolic dysfunction. Patient's chest x-ray this morning showing bibasilar opacities reflecting possible edema. Patient has been getting IV fluids for acute kidney injury. I will DC IV fluids, give 1 dose of IV Lasix, follow response.  4. Acute kidney injury-Cr. Close to baseline and and will cont. To monitor.  - renal dose meds and avoid nephrotoxins.   5. Hypothyroidism-continue Synthroid.  6. Essential hypertension-continue metoprolol.  7. Hyperlipidemia-continue simvastatin.  8. GERD-continue Protonix.  9. History of chronic atrial fibrillation-rate controlled. Continue metoprolol. Continue Coumadin, INR therapeutic presently.  Seen by PT and they recommend Home with Home Health and will arrange prior to discharge.   All the records are reviewed and case discussed with Care Management/Social Worker. Management plans discussed with the patient, family and they are in agreement.  CODE STATUS: DNR  DVT Prophylaxis: Warfarin  TOTAL TIME TAKING CARE OF THIS PATIENT: 30 minutes.   POSSIBLE D/C IN 1-2 DAYS, DEPENDING ON  CLINICAL CONDITION.   [name] M.D on 04/10/2017 at 11:32 AM  Between 7am to 6pm - Pager - [phone]  After 6pm go to [email or web address] - Technical brewer  [facility]  Office  [phone]  CC: Primary care physician; Tower, Wynelle Fanny, MD

## 2017-04-11 ENCOUNTER — Inpatient Hospital Stay (HOSPITAL_COMMUNITY)
Admit: 2017-04-11 | Discharge: 2017-04-11 | Disposition: A | Discharge status: Home / Self Care | Payer: PPO | Attending: Physician Assistant | Admitting: Physician Assistant

## 2017-04-11 ENCOUNTER — Inpatient Hospital Stay: Payer: PPO

## 2017-04-11 DIAGNOSIS — I482 Chronic atrial fibrillation: Secondary | ICD-10-CM

## 2017-04-11 DIAGNOSIS — R0602 Shortness of breath: Secondary | ICD-10-CM

## 2017-04-11 DIAGNOSIS — I361 Nonrheumatic tricuspid (valve) insufficiency: Secondary | ICD-10-CM

## 2017-04-11 DIAGNOSIS — I5043 Acute on chronic combined systolic (congestive) and diastolic (congestive) heart failure: Secondary | ICD-10-CM

## 2017-04-11 DIAGNOSIS — D518 Other vitamin B12 deficiency anemias: Secondary | ICD-10-CM

## 2017-04-11 DIAGNOSIS — R531 Weakness: Secondary | ICD-10-CM

## 2017-04-11 DIAGNOSIS — I2510 Atherosclerotic heart disease of native coronary artery without angina pectoris: Secondary | ICD-10-CM

## 2017-04-11 DIAGNOSIS — R0603 Acute respiratory distress: Secondary | ICD-10-CM

## 2017-04-11 DIAGNOSIS — R630 Anorexia: Secondary | ICD-10-CM

## 2017-04-11 DIAGNOSIS — J181 Lobar pneumonia, unspecified organism: Secondary | ICD-10-CM

## 2017-04-11 DIAGNOSIS — N179 Acute kidney failure, unspecified: Secondary | ICD-10-CM

## 2017-04-11 LAB — BASIC METABOLIC PANEL
Anion gap: 8 (ref 5–15)
BUN: 32 mg/dL — AB (ref 6–20)
CALCIUM: 8.9 mg/dL (ref 8.9–10.3)
CO2: 28 mmol/L (ref 22–32)
CREATININE: 1.61 mg/dL — AB (ref 0.44–1.00)
Chloride: 107 mmol/L (ref 101–111)
GFR calc non Af Amer: 26 mL/min — ABNORMAL LOW (ref >60)
GFR, EST AFRICAN AMERICAN: 31 mL/min — AB (ref >60)
GLUCOSE: 100 mg/dL — AB (ref 65–99)
Potassium: 3.3 mmol/L — ABNORMAL LOW (ref 3.5–5.1)
Sodium: 143 mmol/L (ref 135–145)

## 2017-04-11 LAB — ECHOCARDIOGRAM COMPLETE
Height: 67 in
Weight: 1835.2 oz

## 2017-04-11 LAB — TROPONIN I: Troponin I: 0.03 ng/mL (ref <0.03)

## 2017-04-11 LAB — PROTIME-INR
INR: 3.2
PROTHROMBIN TIME: 33.5 s — AB (ref 11.4–15.2)

## 2017-04-11 LAB — MAGNESIUM: Magnesium: 1.6 mg/dL — ABNORMAL LOW (ref 1.7–2.4)

## 2017-04-11 MED ORDER — IPRATROPIUM-ALBUTEROL 0.5-2.5 (3) MG/3ML IN SOLN
3.0000 mL | Freq: Three times a day (TID) | RESPIRATORY_TRACT | Status: DC
  Administered 2017-04-11 – 2017-04-12 (×4): 3 mL via RESPIRATORY_TRACT
  Filled 2017-04-11 (×4): qty 3

## 2017-04-11 MED ORDER — IPRATROPIUM-ALBUTEROL 0.5-2.5 (3) MG/3ML IN SOLN
3.0000 mL | Freq: Four times a day (QID) | RESPIRATORY_TRACT | Status: DC | PRN
  Administered 2017-04-14: 21:00:00 3 mL via RESPIRATORY_TRACT
  Filled 2017-04-11: qty 3

## 2017-04-11 MED ORDER — POTASSIUM CHLORIDE CRYS ER 20 MEQ PO TBCR
40.0000 meq | EXTENDED_RELEASE_TABLET | Freq: Two times a day (BID) | ORAL | Status: DC
  Administered 2017-04-11 – 2017-04-12 (×4): 40 meq via ORAL
  Filled 2017-04-11 (×4): qty 2

## 2017-04-11 MED ORDER — FUROSEMIDE 10 MG/ML IJ SOLN
40.0000 mg | Freq: Once | INTRAMUSCULAR | Status: AC
  Administered 2017-04-11: 13:00:00 40 mg via INTRAVENOUS
  Filled 2017-04-11: qty 4

## 2017-04-11 MED ORDER — MAGNESIUM SULFATE 4 GM/100ML IV SOLN
4.0000 g | Freq: Once | INTRAVENOUS | Status: AC
  Administered 2017-04-11: 14:00:00 4 g via INTRAVENOUS
  Filled 2017-04-11: qty 100

## 2017-04-11 MED ORDER — DEXTROSE 5 % IV SOLN
1.0000 g | Freq: Every day | INTRAVENOUS | Status: DC
  Administered 2017-04-11 – 2017-04-13 (×3): 1 g via INTRAVENOUS
  Filled 2017-04-11 (×4): qty 1

## 2017-04-11 MED ORDER — FUROSEMIDE 10 MG/ML IJ SOLN
40.0000 mg | Freq: Two times a day (BID) | INTRAMUSCULAR | Status: DC
  Administered 2017-04-11 – 2017-04-12 (×3): 40 mg via INTRAVENOUS
  Filled 2017-04-11 (×3): qty 4

## 2017-04-11 MED ORDER — POTASSIUM CHLORIDE CRYS ER 20 MEQ PO TBCR
40.0000 meq | EXTENDED_RELEASE_TABLET | Freq: Once | ORAL | Status: AC
  Administered 2017-04-11: 13:00:00 40 meq via ORAL
  Filled 2017-04-11: qty 2

## 2017-04-11 NOTE — Progress Notes (Signed)
Pt w/ reduced ejection fraction HF. Pt not on ACEI/ARB due to renal function and BP.   Ramond Dial, Pharm.D, BCPS Clinical Pharmacist

## 2017-04-11 NOTE — Care Management Note (Signed)
Case Management Note  Patient Details  Name: [name] MRN: [id number] Date of Birth: 07-30-1922  Subjective/Objective:                  Admitted to [facility] with the diagnosis of pneumonia. Lives at [place] x 4 years. Daughter is [name] [phone] Last was at Dr. Marliss Coots office 04/07/17. Prescriptions are filled at [facility]. [facility] several times in the past. [employer] 03/2016 x 3 weeks. Ho home oxygen. Life Alert, rolling walker, bedside commode, and lift in the home. Private pay caregiver 7 days a week from 8:30am-10:30 am. No falls. Fair appetite.  Action/Plan: Physical therapy evaluation completed x 1. Recommending Home Health Physical therapy and intermittant supervision. Daughter requesting re-evaluation due to her mother "being so weak."  [name] PT will evaluate today    Expected Discharge Date:                  Expected Discharge Plan:     In-House Referral:     Discharge planning Services     Post Acute Care Choice:    Choice offered to:     DME Arranged:    DME Agency:     HH Arranged:    HH Agency:     Status of Service:     If discussed at [employer] of Stay Meetings, dates discussed:    Additional Comments:  [name] Ammons, RN MSN CCM Care Management [phone] 04/11/2017, 9:58 AM

## 2017-04-11 NOTE — Progress Notes (Signed)
[facility] [place] NAME: [name]    MR#:  [id number]  DATE OF BIRTH:  [birth year]  SUBJECTIVE:   Still feels weak and short of breath.  Daughter at bedside.  CXR today still showing b/l pleural effusions with Pulm. Edema and CHF.  As per Daughter she is having some Foamy sputum production.   REVIEW OF SYSTEMS:    Review of Systems  Constitutional: Negative for chills and fever.  HENT: Negative for congestion and tinnitus.   Eyes: Negative for blurred vision and double vision.  Respiratory: Positive for cough and shortness of breath. Negative for wheezing.   Cardiovascular: Negative for chest pain, orthopnea and PND.  Gastrointestinal: Negative for abdominal pain, diarrhea, nausea and vomiting.  Genitourinary: Negative for dysuria and hematuria.  Neurological: Positive for weakness. Negative for dizziness, sensory change and focal weakness.  All other systems reviewed and are negative.   Nutrition: Dysphagia II, nectar thick liquids.  Tolerating Diet: Yes Tolerating PT: Eval noted.   DRUG ALLERGIES:   Allergies  Allergen Reactions  . Sulfonamide Derivatives Nausea And Vomiting    REACTION: sick  . Alendronate Sodium Other (See Comments)    REACTION: GI    VITALS:  Blood pressure (!) 107/54, pulse 88, temperature 98 F (36.7 C), temperature source Oral, resp. rate 16, height 5\' 7"  (1.702 m), weight 54.9 kg (121 lb), SpO2 96 %.  PHYSICAL EXAMINATION:   Physical Exam  GENERAL:  83 y.o.-year-old patient lying in bed in mild resp. Distress.  EYES: Pupils equal, round, reactive to light and accommodation. No scleral icterus. Extraocular muscles intact.  HEENT: Head atraumatic, normocephalic. Oropharynx and nasopharynx clear.  NECK:  Supple, no jugular venous distention. No thyroid enlargement, no tenderness.  LUNGS: Poor Resp. effort, no wheezing, Right sided rales, No rhonchi. No use of accessory muscles of respiration.   CARDIOVASCULAR: S1, S2 normal. No murmurs, rubs, or gallops.  ABDOMEN: Soft, nontender, nondistended. Bowel sounds present. No organomegaly or mass.  EXTREMITIES: No cyanosis, clubbing or edema b/l.    NEUROLOGIC: Cranial nerves II through XII are intact. No focal Motor or sensory deficits b/l.   PSYCHIATRIC: The patient is alert and oriented x 3.  SKIN: No obvious rash, lesion, or ulcer.    LABORATORY PANEL:   CBC  Recent Labs Lab 04/08/17 0535  WBC 10.1  HGB 10.6*  HCT 32.0*  PLT 115*   ------------------------------------------------------------------------------------------------------------------  Chemistries   Recent Labs Lab 04/09/17 0503 04/11/17 0446  NA 137 143  K 4.0 3.3*  CL 110 107  CO2 22 28  GLUCOSE 98 100*  BUN 39* 32*  CREATININE 1.54* 1.61*  CALCIUM 7.8* 8.9  MG  --  1.6*  AST 34  --   ALT 19  --   ALKPHOS 71  --   BILITOT 0.9  --    ------------------------------------------------------------------------------------------------------------------  Cardiac Enzymes  Recent Labs Lab 04/07/17 1339  TROPONINI 0.09*   ------------------------------------------------------------------------------------------------------------------  RADIOLOGY:  Dg Chest Port 1 View  Result Date: 04/11/2017 CLINICAL DATA:  Shortness of breath. EXAM: PORTABLE CHEST 1 VIEW COMPARISON:  Radiograph of April 10, 2017. FINDINGS: Stable cardiomegaly. Atherosclerosis of thoracic aorta is noted. No pneumothorax is noted. Stable bilateral diffuse interstitial densities are noted concerning for pulmonary edema with associated bilateral pleural effusions. Bony thorax is unremarkable. IMPRESSION: Stable cardiomegaly with bilateral pulmonary edema and pleural effusions concerning for congestive heart failure. Aortic atherosclerosis. Electronically Signed   By: Marijo Conception, M.D.  On: 04/11/2017 12:32   Dg Chest Port 1 View  Result Date: 04/10/2017 CLINICAL DATA:   Pneumonia.  Productive cough. EXAM: PORTABLE CHEST 1 VIEW COMPARISON:  04/07/2017 FINDINGS: The cardiac silhouette remains moderately enlarged. Aortic atherosclerosis is noted. Diffuse interstitial prominence is stable to mildly increased. Patchy right basilar airspace opacity has mildly increased. There is also increasing left basilar airspace opacity with small pleural effusions, left larger than right and also likely slightly increased in size. Hazy opacity is present in the right midlung. No pneumothorax is identified. Prior augmentation of the thoracic spine compression fracture is noted. IMPRESSION: Cardiomegaly with diffuse interstitial densities and worsening bibasilar airspace opacities and pleural effusions. This may reflect edema or pneumonia. Electronically Signed   By: Logan Bores M.D.   On: 04/10/2017 10:10     ASSESSMENT AND PLAN:   83 year old female with past medical history of hypertension, COPD, osteoarthritis, chronic atrial fibrillation, history of coronary artery disease, hyperlipidemia, GERD, previous history of MI who presented to the hospital due to shortness of breath, cough.  1. Acute respiratory failure with hypoxia-suspected be secondary to pneumonia and now CHF.  -Continue IV antibiotics incentive spirometry and antitussives. Still having significant shortness of breath CXR today showing Pulm. Edema and CHF. Will give another dose of 40 mg IV Lasix and consult Cardiology (Dr. Rockey Situ).   2. Pneumonia-continue IV cefepime, follow blood, sputum cultures which are (-) so far.  -Continue antitussives and supportive care.  3. CHF-acute on chronic systolic dysfunction. CXR today still showing Pulm. Edema.  - will give another dose of IV Lasix and follow response.  Cardiology Consult (Dr. Rockey Situ).   4. Acute kidney injury- ?? Due To CHF.   - will cont. Diuresis and follow BUN/Cr. Renal dose meds and avoid nephrotoxins.   5. Hypothyroidism-continue Synthroid.  6. Essential  hypertension-continue metoprolol.  7. Hyperlipidemia-continue simvastatin.  8. GERD-continue Protonix.  9. History of chronic atrial fibrillation-rate controlled. Continue metoprolol. Continue Coumadin, INR therapeutic presently.  10. Hypokalemia/Hypomagnesemia - due to diuresis and will cont. To replace and repeat in a.m.   Seen by PT and they recommend SNF/STR now and Social Work made aware.   Greater than 50% of time spent in coordinating care and discussing plan of care with the patient's daughter. Also speaking to her cardiologist about the patient.  All the records are reviewed and case discussed with Care Management/Social Worker. Management plans discussed with the patient, family and they are in agreement.  CODE STATUS: DNR  DVT Prophylaxis: Warfarin  TOTAL TIME TAKING CARE OF THIS PATIENT: 35 minutes.   POSSIBLE D/C IN 1-2 DAYS, DEPENDING ON CLINICAL CONDITION.   [name] M.D on 04/11/2017 at 1:25 PM  Between 7am to 6pm - Pager - [phone]  After 6pm go to [email or web address] - Technical brewer [facility]  Office  [phone]  CC: Primary care physician; Tower, Wynelle Fanny, MD

## 2017-04-11 NOTE — Consult Note (Signed)
Cardiology Consultation Note  Patient ID: [name], MRN: [id number], DOB/AGE: 1921/12/09 83 y.o. Admit date: 04/07/2017   Date of Consult: 04/11/2017 Primary Physician: Tower, Wynelle Fanny, MD Primary Cardiologist: Dr. Rockey Situ, MD Requesting Physician: Dr. Verdell Carmine, MD  Chief Complaint: Cough Reason for Consult: Acute on chronic combined CHF  HPI: [name] is a 83 y.o. female who is being seen today for the evaluation of acute on chronic combined CHF at the request of Dr. Verdell Carmine, MD. Patient has a h/o CAD s/p remote PCI x 2 to the LAD in 2004, chronic Afib on Coumadin, chronic combined CHF, pulmonary hypertension, mitral regurgitation, prolonged QT, LBBB, orthostatic hypotension, carotid artery disease, HTN, thrombocytopenia, and chronic back pain who was admitted on 7/29 with cough and found to have PNA. Cardiology asked to evaluate for CHF.   Prior cardiac cath from 08/2003 at [facility] showed mid LAD with 75% stenosis s/p PCI/DES and distal LAD with 75% stenosis s/p PCI/DES. She was admitted to [facility] in 03/2015 with N/V/volume overload requiring diuresis. Echo that admission showed a newly found cardiomyopathy with an EF of 30-35% (previously 50-55% in 2014). Given her frailty it was felt conservative maangement was best. She continued to have fatigue, thus underwent Lexiscan Myoview on 07/02/2015 that showed no ischemia, anteroseptal perfusion defect consistent with bundle branch block. Most recent echo from 01/2016 showed an EF of HK of anteroseptal myocardium, mildly dilated aoritc root at 3.4 cm, mildly dilated ascending aorta at 3.5 cm, mild MR, midl to moderate TR, PASP 33 mm Hg. Admitted 09/2016 with influenza and CHF exacerbation. Weight at time of admission noted to be 135 pounds. Underwent usccessful IV diuresis. Follow up weight in winter of 2018 noted to be 122-123 pounds. Was taking lasix every other day at that time. Recently seen by [facility] with lower extremity swelling.  Was taking Lasix 20 mg daily at that time. She was changed to torsemide 20 mg daily on 7/3. Admitted to [facility] on 7/26 with cough x ~ 1 week with increase lethargy. Cough was initially productive of yellow sputum. Found to have a PNA. Noted to have AKI from poor PO intake, which was treated with IV fluids. Since 7/29 sputum has changed to white foam from prior yellow. More orthopneic now. She continued to be SOB, follow up CXR showed bilateral opacities c/w edema vs PNA. She was given IV Lasix on 7/29.   Magnesium noted to be 1.6 this morning. Renal function upon admission noted to be 1.8 (baseline 1.1). With IV fluids renal function improved to 1.54, then trended upwards again s/p one dose of IV Lasix to 1.61 this morning. CBC from 7.27 with WBC 10.1 and HGB 10.6 (likely dilutional). Follow up CXR this morning showed bilateral pulmonary edema and pleural effusions. Just given another dose of IV Lasix 40 mg x 1. No documented weights. Documented UOP for the past 24 hours of 2.25 L, remains net + 280 mL for the admission.     Past Medical History:  Diagnosis Date  . Allergy   . Anemia   . Arthritis    osteo  . CAD (coronary artery disease)    a. 2 DES, [facility], 2004; b. nuc 2013 septal dyssenergy 2/2 LBBB, nl LV fxn;  c. 06/2015 MV: EF 41%, septal HK (LBBB), no ischemia->Low risk.  . Chronic atrial fibrillation (HCC)    a. on Coumadin (CHA2DS2VASc = 6); b. Holter 06/2010, no brady, mild tachy.  . Chronic systolic CHF (congestive heart  failure) ([place])    a. 03/2015 Echo: EF 30-35%;  b. 01/2016 Echo: EF 30-35%.  . Compression fracture of lumbar vertebra ([place]) 05-2008  . COPD (chronic obstructive pulmonary disease) ([place])   . Diverticulosis of colon   . Dyslipidemia   . Essential hypertension   . Family history of adverse reaction to anesthesia    daughter PONV  . GERD (gastroesophageal reflux disease)   . Hard of hearing   . Hematemesis    a. 03/2015.  [name] Hx of colonoscopy   . Hypothyroid   .  Ischemic cardiomyopathy    a. 03/2015 Echo: EF 30-35%, ant, antsept HK, mod MR, mildly dil LA/RV, mod dil RA, mod-sev TR, PASP 20mmHg;  b. 01/2016 Echo: EF 30-35%, ant/antsept HK, mildly dil Ao root (3.4cm) and Asc Ao (3.5cm), mild [email or web address] bi-atrial enlargement, mild to mod TR. PASP 57mmHg.  [name] LBBB (left bundle branch block)   . Lung abnormality    Dr [name] 122/2011 no further w/u needed  . Mitral regurgitation   . Myocardial infarction [facility]) 2016   "mild heart attack"  . Nausea    Some nausea with medications, May, 2013,  . Nocturia   . OA (osteoarthritis)   . Orthostatic hypotension    June, 2014  . Osteoporosis   . Pleural effusion associated with pulmonary infection 03-2007  . Prolonged QT interval    when on sotolol in past  . Pulmonary hypertension ([place])    a. 03/2015 PASP 87mmHg by echo.  . Shingles 2014  . Thrombocytopenia ([place])   . TR (tricuspid regurgitation)    moderate, echo 2008  . Urinary frequency   . Vitamin B deficiency   . Warfarin anticoagulation   . Wears dentures   . Wears glasses   . Weight loss    August, 2012      Most Recent Cardiac Studies: TTE 01/2016: Study Conclusions  - Left ventricle: The cavity size was normal. Systolic function was   moderately to severely reduced. The estimated ejection fraction   was in the range of 30% to 35%. Hypokinesis of the anterior   myocardium. Hypokinesis of the anteroseptal myocardium. Left   ventricular diastolic function parameters were normal. - Aortic root: The aortic root was mildly dilated, 3.4 cm,   ascending aorta mildly dilated 3.5 cm - Mitral valve: There was mild regurgitation. - Left atrium: The atrium was mildly dilated. - Right atrium: The atrium was mildly dilated. - Tricuspid valve: There was mild-moderate regurgitation. - Pulmonary arteries: PA peak pressure: 33 mm Hg (S).   Surgical History:  Past Surgical History:  Procedure Laterality Date  . COLONOSCOPY    . CORONARY ANGIOPLASTY  2004   . LUMBAR LAMINECTOMY/DECOMPRESSION MICRODISCECTOMY N/A 03/23/2016   Procedure: Lumbar three-four Laminectomy/Foraminotomy, posterior laterial arthrodesis;  Surgeon: Leeroy Cha, MD;  Location: [facility] NEURO ORS;  Service: Neurosurgery;  Laterality: N/A;  L3-4 Laminectomy/Foraminotomy  . THYROIDECTOMY  1962  . TONSILLECTOMY       Home Meds: Prior to Admission medications   Medication Sig Start Date End Date Taking? Authorizing Provider  calcium carbonate (OS-CAL) 600 MG TABS tablet Take 1,200 mg by mouth every evening.    Yes [provider]  docusate sodium (COLACE) 100 MG capsule Take 100 mg by mouth at bedtime.    Yes [provider]  fluticasone (FLONASE) 50 MCG/ACT nasal spray 2 SPRAYS IN BOTH NOSTRILS DAILY 05/28/16  Yes Tower, Marne A, MD  Glucosamine HCl 500 MG TABS Take 500 mg by  mouth daily.    Yes [provider]  levothyroxine (SYNTHROID, LEVOTHROID) 88 MCG tablet TAKE ONE TABLET BY MOUTH EVERY DAY 11/29/16  Yes Tower, Wynelle Fanny, MD  metoprolol tartrate (LOPRESSOR) 25 MG tablet Take 0.5 tablets (12.5 mg total) by mouth 2 (two) times daily. Patient taking differently: Take 12.5 mg by mouth 2 (two) times daily. HOLD evening dose if BP is under 110 05/11/16  Yes Gollan, Kathlene November, MD  midodrine (PROAMATINE) 5 MG tablet TAKE ONE TABLET BY MOUTH TWICE DAILY WITH A MEAL 01/16/16  Yes Gollan, Kathlene November, MD  mirtazapine (REMERON) 15 MG tablet TAKE 1 TABLET BY MOUTH AT BEDTIME 11/29/16  Yes Tower, Wynelle Fanny, MD  Multiple Vitamin (MULTIVITAMIN) tablet Take 1 tablet by mouth daily.    Yes [provider]  multivitamin-lutein (OCUVITE-LUTEIN) CAPS capsule Take 1 capsule by mouth daily.   Yes [provider]  pantoprazole (PROTONIX) 40 MG tablet TAKE ONE TABLET BY MOUTH EVERY DAY 11/29/16  Yes Tower, Wynelle Fanny, MD  Polyethyl Glycol-Propyl Glycol (SYSTANE OP) Apply 1 drop to eye daily.   Yes [provider]  potassium chloride (K-DUR) 10 MEQ tablet Take 1  tablet (10 mEq total) by mouth as needed (take with lasix). 10/15/16 04/07/17 Yes Minna Merritts, MD  simvastatin (ZOCOR) 20 MG tablet TAKE 1/2 TABLET BY MOUTH EVERY EVENING 12/29/16  Yes Tower, Wynelle Fanny, MD  torsemide (DEMADEX) 20 MG tablet Take 1 tablet (20 mg total) by mouth daily. 03/15/17 04/14/17 Yes Darylene Price A, FNP  warfarin (COUMADIN) 5 MG tablet Take 1 tablet (5 mg total) by mouth daily before supper. Take 1 - 2 pills daily AS DIRECTED BY ANTI-COAGULATION CLINIC Patient taking differently: Take 5-7.5 mg by mouth daily before supper. Take 5mg  on Tuesday, Friday, and Sunday. Take 7.5mg  on Mon, Wed, Thurs, and Sat. 03/01/17  Yes Tower, Wynelle Fanny, MD  azithromycin (ZITHROMAX Z-PAK) 250 MG tablet Take 2 pills by mouth today and then 1 pill daily for 4 days Patient not taking: Reported on 04/07/2017 04/07/17   Lucille Passy, MD    Inpatient Medications:  . calcium carbonate  1,000 mg Oral QPM  . cyanocobalamin  1,000 mcg Intramuscular Q30 days  . docusate sodium  100 mg Oral Daily  . fluticasone  1 spray Each Nare Daily  . ipratropium-albuterol  3 mL Nebulization TID  . levothyroxine  88 mcg Oral Daily  . metoprolol tartrate  12.5 mg Oral BID  . midodrine  5 mg Oral BID WC  . mirtazapine  15 mg Oral QHS  . multivitamin with minerals  1 tablet Oral Daily  . multivitamin-lutein  1 capsule Oral Daily  . nystatin  5 mL Oral QID  . pantoprazole  40 mg Oral Daily  . polyvinyl alcohol  1 drop Both Eyes Daily  . simvastatin  10 mg Oral QPM  . Warfarin - Pharmacist Dosing Inpatient   Does not apply q1800   . ceFEPime (MAXIPIME) IV    . magnesium sulfate 1 - 4 g bolus IVPB    . sodium chloride      Allergies:  Allergies  Allergen Reactions  . Sulfonamide Derivatives Nausea And Vomiting    REACTION: sick  . Alendronate Sodium Other (See Comments)    REACTION: GI    Social History   Social History  . Marital status: Widowed    Spouse name: N/A  . Number of children: N/A  . Years of  education: N/A   Occupational  History  . Not on file.   Social History Main Topics  . Smoking status: Former Smoker    Types: Cigarettes    Quit date: 09/14/1947  . Smokeless tobacco: Never Used  . Alcohol use No  . Drug use: No  . Sexual activity: Not Currently   Other Topics Concern  . Not on file   Social History Narrative   Very independent    Lives in [place] by herself            Family History  Problem Relation Age of Onset  . Heart disease Mother 23  . Stroke Father 50  . Heart disease Sister   . Cancer Brother        colon  . Diabetes Brother   . Leukemia Sister      Review of Systems: Review of Systems  Constitutional: Positive for malaise/fatigue. Negative for chills, diaphoresis, fever and weight loss.  HENT: Negative for congestion.   Eyes: Negative for discharge and redness.  Respiratory: Positive for cough, sputum production, shortness of breath and wheezing. Negative for hemoptysis.        Sputum initially yellow, now white/foam  Cardiovascular: Positive for orthopnea and leg swelling. Negative for chest pain, palpitations, claudication and PND.  Gastrointestinal: Negative for abdominal pain, blood in stool, heartburn, melena, nausea and vomiting.  Genitourinary: Negative for hematuria.  Musculoskeletal: Negative for falls and myalgias.  Skin: Negative for rash.  Neurological: Positive for weakness. Negative for dizziness, tingling, tremors, sensory change, speech change, focal weakness and loss of consciousness.  Endo/Heme/Allergies: Does not bruise/bleed easily.  Psychiatric/Behavioral: Negative for substance abuse. The patient is not nervous/anxious.   All other systems reviewed and are negative.   Labs: No results for input(s): CKTOTAL, CKMB, TROPONINI in the last 72 hours. Lab Results  Component Value Date   WBC 10.1 04/08/2017   HGB 10.6 (L) 04/08/2017   HCT 32.0 (L) 04/08/2017   MCV 93.7 04/08/2017   PLT 115 (L) 04/08/2017      Recent Labs Lab 04/09/17 0503 04/11/17 0446  NA 137 143  K 4.0 3.3*  CL 110 107  CO2 22 28  BUN 39* 32*  CREATININE 1.54* 1.61*  CALCIUM 7.8* 8.9  PROT 5.2*  --   BILITOT 0.9  --   ALKPHOS 71  --   ALT 19  --   AST 34  --   GLUCOSE 98 100*   Lab Results  Component Value Date   CHOL 147 05/12/2015   HDL 46.00 05/12/2015   LDLCALC 83 05/12/2015   TRIG 91.0 05/12/2015                    Radiology/Studies:  Dg Chest 2 View  Result Date: 04/07/2017 IMPRESSION: New right basilar infiltrate when compared with the prior exam. Stable left basilar changes are seen. Electronically Signed   By: Inez Catalina M.D.   On: 04/07/2017 14:21   Dg Chest Port 1 View  Result Date: 04/11/2017 IMPRESSION: Stable cardiomegaly with bilateral pulmonary edema and pleural effusions concerning for congestive heart failure. Aortic atherosclerosis. Electronically Signed   By: Marijo Conception, M.D.   On: 04/11/2017 12:32   Dg Chest Port 1 View  Result Date: 04/10/2017 IMPRESSION: Cardiomegaly with diffuse interstitial densities and worsening bibasilar airspace opacities and pleural effusions. This may reflect edema or pneumonia. Electronically Signed   By: Logan Bores M.D.   On: 04/10/2017 10:10    EKG: Interpreted by me showed: Afib,  LBBB Telemetry: Interpreted by me showed: not on telemetry   Weights: Filed Weights   04/07/17 1332  Weight: 121 lb (54.9 kg)     Physical Exam: Blood pressure (!) 107/54, pulse 88, temperature 98 F (36.7 C), temperature source Oral, resp. rate 16, height 5\' 7"  (1.702 m), weight 121 lb (54.9 kg), SpO2 96 %. Body mass index is 18.95 kg/m. General: Frail and elderly appearing, in no acute distress. Head: Normocephalic, atraumatic, sclera non-icteric, no xanthomas, nares are without discharge.  Neck: Negative for carotid bruits. JVD elevated elevated to the angle of the mandible. Lungs: Clear bilaterally to auscultation without wheezes, rales, or  rhonchi. Breathing is unlabored. Heart: Irregularly irregular with S1 S2. II/VI systolic murmur, no rubs, or gallops appreciated. Abdomen: Soft, non-tender, non-distended with normoactive bowel sounds. No hepatomegaly. No rebound/guarding. No obvious abdominal masses. Msk:  Strength and tone appear normal for age. Extremities: No clubbing or cyanosis. 1+ sacral edema. Distal pedal pulses are 2+ and equal bilaterally. Neuro: Alert and oriented X 3. No facial asymmetry. No focal deficit. Moves all extremities spontaneously. Psych:  Responds to questions appropriately with a normal affect.    Assessment and Plan:  Principal Problem:   Acute respiratory distress Active Problems:   Acute on chronic combined systolic (congestive) and diastolic (congestive) heart failure (HCC)   Pneumonia   CAD in native artery   Chronic atrial fibrillation (HCC)   AKI (acute kidney injury) ([place])   Hypomagnesemia   Pressure injury of skin    1. Acute respiratory distress with hypoxia: -Previously required nasal cannula, now on room air -Likely multifactorial including PNA and acute on chronic combined CHF  2. Acute on chronic combined CHF: -She appears volume overloaded -Agree with IV Lasix 40 mg, will make this bid for today with KCl repletion -Monitor UOP and renal function on IV Lasix -Check TTE -If she does not have brisk UOP or if renal function continues to trend upwards with IV diuresis she may require transfer to the CCU for milrinone infusion (if family would want this) -Consider consulting Palliative care to discuss goals of care -Continue metoprolol as BP allows, consider changing to long acting metoprolol vs Coreg given cardiomyopathy -Not on ACEi/ARB/Entresto/spironolactone given prior orthostatic hypotension and current AKI -Daily weights -Strict Is & Os  3. Chronic Afib: -Rate controlled by auscultation -Place on telemetry  -Metoprolol as above -Continue Coumadin per  pharmacy -CHADS2VASc at least 6 (CHF, HTN, age x 2, vascular disease, female)  4. PNA: -Per IM  5. AKI: -Monitor with diuresis  6. Elevated troponin/CAD: -Initial troponin noted to be 0.09, not cycled -Check troponin -Check TTE as above -Metoprolol as above -On Coumadin in place of ASA  7. Hypomagnesemia: -Replete to goal of 2.0   Signed, Christell Faith, PA-C [place] Pager: [phone] 04/11/2017, 1:44 PM

## 2017-04-11 NOTE — Progress Notes (Signed)
ANTICOAGULATION CONSULT NOTE - Follow up [place] for warfarin Indication: atrial fibrillation  Allergies  Allergen Reactions  . Sulfonamide Derivatives Nausea And Vomiting    REACTION: sick  . Alendronate Sodium Other (See Comments)    REACTION: GI   Patient Measurements: Height: 5\' 7"  (170.2 cm) Weight: 121 lb (54.9 kg) IBW/kg (Calculated) : 61.6  Vital Signs: Temp: 98.6 F (37 C) (07/30 0800) Temp Source: Oral (07/30 0800) BP: 128/63 (07/30 0800) Pulse Rate: 104 (07/30 0800)  Labs:  Recent Labs  04/09/17 0503 04/10/17 0520 04/11/17 0446  LABPROT 33.6* 34.4* 33.5*  INR 3.22 3.31 3.20  CREATININE 1.54*  --  1.61*    Estimated Creatinine Clearance: 18.9 mL/min (A) (by C-G formula based on SCr of 1.61 mg/dL (H)).  Assessment: Pharmacy consulted to dose and monitor warfarin in this 83 year old female taking warfarin PTA for atrial fibrillation.   Home dose: warfarin 5 mg PO Tuesday, Friday, Sunday           Warfarin 7.5 mg PO Mon, Wed, Thurs, Sat Last dose PTA: 7/25 7/26  INR = 3.49 on admission 7/27 INR 4.31; held 7/28     INR= 3.22; 5 mg  7/29     INR= 3.31 held 7/30     INR =3.20  Goal of Therapy:  INR 2-3  Plan:  INR still slightly above goal range. Will hold warfarin one more day. Will consider resuming warfarin tomorrow at 5mg  daily, depending on tomorrow INR. Will recheck INR with AM labs tomorrow. Will need CBC at least every three days.   Pharmacy will continue to follow.  Ramond Dial, Pharm.D, BCPS Clinical Pharmacist  04/11/2017,9:21 AM

## 2017-04-11 NOTE — Progress Notes (Signed)
Physical Therapy Treatment Patient Details Name: [name] MRN: [id number] DOB: Sep 15, 1921 Today's Date: 04/11/2017    History of Present Illness Pt is a 83 y.o. female presenting to hospital with productive cough and fever.  Pt admitted with PNA and AKI.  PMH includes OA, CAD, chronic a-fib, systolic CHF, compression fx L-spine, COPD, LBBB, MI, orthostatic hypotension.    PT Comments    Pt appearing weaker since initial PT evaluation (now requiring assist to stand) and only able to walk 10 feet with RW before needing to sit and rest; pt then requiring almost 10 minutes of sitting rest break to walk 10 feet back to bed with RW.  Pt appearing with increased work of breathing and easily fatigued with activity (pt reporting recently walking to bathroom prior to PT session and was really fatigued from that activity).  O2 90% or greater on room air during session (92% end of session on room air).  Pt currently does not appear safe to discharge back to prior living situation d/t current assist levels and impaired activity tolerance; recommend pt discharge to STR (CM notified).  Will continue to focus on strengthening, activity tolerance, and increasing ambulation distance per pt tolerance.    Follow Up Recommendations  SNF     Equipment Recommendations  Rolling walker with 5" wheels    Recommendations for Other Services       Precautions / Restrictions Precautions Precautions: Fall Restrictions Weight Bearing Restrictions: No    Mobility  Bed Mobility Overal bed mobility: Needs Assistance Bed Mobility: Supine to Sit;Sit to Supine     Supine to sit: Supervision;HOB elevated Sit to supine: Supervision;HOB elevated   General bed mobility comments: increased effort and time to perform  Transfers Overall transfer level: Needs assistance Equipment used: Rolling walker (2 wheeled) Transfers: Sit to/from Stand Sit to Stand: Min assist         General transfer comment:  increased effort to stand; assist to initiate stand and come to upright posture (x2 trials)  Ambulation/Gait Ambulation/Gait assistance: Min guard Ambulation Distance (Feet):  (10 feet x2) Assistive device: Rolling walker (2 wheeled)   Gait velocity: decreased   General Gait Details: decreased B step length/foot clearance/heelstrike; forward flexed posture   Stairs            Wheelchair Mobility    Modified Rankin (Stroke Patients Only)       Balance Overall balance assessment: Needs assistance Sitting-balance support: No upper extremity supported;Feet supported Sitting balance-Leahy Scale: Fair Sitting balance - Comments: static sitting   Standing balance support: Bilateral upper extremity supported (UE support on RW) Standing balance-Leahy Scale: Poor Standing balance comment: requires UE support to maintain balance                            Cognition Arousal/Alertness: Awake/alert Behavior During Therapy: WFL for tasks assessed/performed Overall Cognitive Status: Within Functional Limits for tasks assessed                                        Exercises      General Comments General comments (skin integrity, edema, etc.): Pt's daughter present for session.  Pt agreeable to PT session.      Pertinent Vitals/Pain Pain Assessment: No/denies pain  HR WFL during session.    Home Living  Prior Function            PT Goals (current goals can now be found in the care plan section) Acute Rehab PT Goals Patient Stated Goal: to feel better PT Goal Formulation: With patient Time For Goal Achievement: 04/23/17 Potential to Achieve Goals: Fair Progress towards PT goals: Not progressing toward goals - comment (Pt appearing weaker since initial PT eval)    Frequency    Min 2X/week      PT Plan Discharge plan needs to be updated    Co-evaluation              AM-PAC PT "6 Clicks" Daily  Activity  Outcome Measure  Difficulty turning over in bed (including adjusting bedclothes, sheets and blankets)?: A Little Difficulty moving from lying on back to sitting on the side of the bed? : A Little Difficulty sitting down on and standing up from a chair with arms (e.g., wheelchair, bedside commode, etc,.)?: Total Help needed moving to and from a bed to chair (including a wheelchair)?: A Little Help needed walking in hospital room?: A Little Help needed climbing 3-5 steps with a railing? : A Lot 6 Click Score: 15    End of Session Equipment Utilized During Treatment: Gait belt Activity Tolerance: Patient limited by fatigue Patient left: in bed;with call bell/phone within reach;with bed alarm set;with family/visitor present (B heels elevated via pillow) Nurse Communication: Mobility status;Precautions PT Visit Diagnosis: Muscle weakness (generalized) (M62.81);Difficulty in walking, not elsewhere classified (R26.2)     Time: 1100-1130 PT Time Calculation (min) (ACUTE ONLY): 30 min  Charges:  $Therapeutic Activity: 23-37 mins                    G CodesLeitha Bleak, PT 04/11/17, 1:16 PM [phone]

## 2017-04-11 NOTE — Progress Notes (Signed)
  Speech Language Pathology Treatment: Dysphagia  Patient Details Name: [name] MRN: [id number] DOB: 04-01-1922 Today's Date: 04/11/2017 Time: 6295-2841 SLP Time Calculation (min) (ACUTE ONLY): 52 min  Assessment / Plan / Recommendation Clinical Impression  Pt seen today for toleration of current dysphagia diet; education on aspiration precautions; follow through w/ swallowing strategies; trials to upgrade diet - ice chips.     HPI HPI: Pt is a 83 y.o. female who was admitted w/ cough, weakness, AKI. Patient has multiple medical issues including h/o GERD, shingles, CAD s/p remote PCI x 2 to the LAD in 2004, chronic Afib on Coumadin, chronic combined CHF, pulmonary hypertension, mitral regurgitation, prolonged QT, LBBB, orthostatic hypotension, carotid artery disease, HTN, thrombocytopenia, and chronic back pain who was admitted on 7/29 with cough and found to have PNA. Pt has been vomiting and expectorating foamy phlegm - this could have been contributing to her decline in Pulmonary status d/t the increased risk for aspiration of such phlegm. Pt has a h/o dysphagia; MBSS in 2016 revealed delayed pharyngeal swalllow initiation w/ thin liquids. Pt was recommended to use aspiration precautions and swallowing strategies including a chin tuck when swallowing liquids. Pt stated she has been following the recommendations and this has reduced the coughing, "strangling", but not fully preventing it. Pt and Daughter denied any increase in swallowing issues prior to admission, however, pt is much weaker than usual.       SLP Plan  Continue with current plan of care       Recommendations  Diet recommendations: Dysphagia 3 (mechanical soft);Nectar-thick liquid Liquids provided via: Cup;Straw (ice chips via Spoon) Medication Administration: Whole meds with puree (crush if able and needed to) Supervision: Patient able to self feed;Staff to assist with self feeding;Full supervision/cueing for  compensatory strategies Compensations: Minimize environmental distractions;Slow rate;Small sips/bites;Multiple dry swallows after each bite/sip;Follow solids with liquid Postural Changes and/or Swallow Maneuvers: Seated upright 90 degrees;Upright 30-60 min after meal                General recommendations:  (Dietician f/u for supplements) Oral Care Recommendations: Oral care BID;Patient independent with oral care;Staff/trained caregiver to provide oral care Follow up Recommendations: Skilled Nursing facility SLP Visit Diagnosis: Dysphagia, oropharyngeal phase (R13.12) Plan: Continue with current plan of care       [place], [place], CCC-SLP Watson,Katherine 04/11/2017, 4:54 PM

## 2017-04-11 NOTE — Care Management Important Message (Signed)
Important Message  Patient Details  Name: [name] MRN: [id number] Date of Birth: 1922-04-16   Medicare Important Message Given:  Yes    Shelbie Ammons, RN 04/11/2017, 8:02 AM

## 2017-04-12 DIAGNOSIS — J189 Pneumonia, unspecified organism: Principal | ICD-10-CM

## 2017-04-12 LAB — CULTURE, BLOOD (ROUTINE X 2)
Culture: NO GROWTH
Culture: NO GROWTH
Special Requests: ADEQUATE

## 2017-04-12 LAB — CBC
HEMATOCRIT: 34.1 % — AB (ref 35.0–47.0)
HEMOGLOBIN: 11.3 g/dL — AB (ref 12.0–16.0)
MCH: 31.2 pg (ref 26.0–34.0)
MCHC: 33.2 g/dL (ref 32.0–36.0)
MCV: 93.8 fL (ref 80.0–100.0)
PLATELETS: 201 10*3/uL (ref 150–440)
RBC: 3.64 MIL/uL — AB (ref 3.80–5.20)
RDW: 14 % (ref 11.5–14.5)
WBC: 9 10*3/uL (ref 3.6–11.0)

## 2017-04-12 LAB — BASIC METABOLIC PANEL
Anion gap: 10 (ref 5–15)
BUN: 29 mg/dL — ABNORMAL HIGH (ref 6–20)
CALCIUM: 9.2 mg/dL (ref 8.9–10.3)
CO2: 30 mmol/L (ref 22–32)
CREATININE: 1.52 mg/dL — AB (ref 0.44–1.00)
Chloride: 106 mmol/L (ref 101–111)
GFR, EST AFRICAN AMERICAN: 33 mL/min — AB (ref >60)
GFR, EST NON AFRICAN AMERICAN: 28 mL/min — AB (ref >60)
Glucose, Bld: 117 mg/dL — ABNORMAL HIGH (ref 65–99)
Potassium: 4.7 mmol/L (ref 3.5–5.1)
SODIUM: 146 mmol/L — AB (ref 135–145)

## 2017-04-12 LAB — PROTIME-INR
INR: 2.64
PROTHROMBIN TIME: 28.7 s — AB (ref 11.4–15.2)

## 2017-04-12 MED ORDER — WARFARIN SODIUM 5 MG PO TABS
5.0000 mg | ORAL_TABLET | Freq: Every day | ORAL | Status: DC
  Administered 2017-04-12 – 2017-04-15 (×4): 5 mg via ORAL
  Filled 2017-04-12 (×4): qty 1

## 2017-04-12 NOTE — Progress Notes (Signed)
ANTICOAGULATION CONSULT NOTE - Follow up [facility] for warfarin Indication: atrial fibrillation  Allergies  Allergen Reactions  . Sulfonamide Derivatives Nausea And Vomiting    REACTION: sick  . Alendronate Sodium Other (See Comments)    REACTION: GI   Patient Measurements: Height: 5\' 7"  (170.2 cm) Weight: 123 lb 11.2 oz (56.1 kg) IBW/kg (Calculated) : 61.6  Vital Signs: Temp: 98.4 F (36.9 C) (07/31 0412) Temp Source: Oral (07/31 0412) BP: 126/63 (07/31 0412) Pulse Rate: 91 (07/31 0412)  Labs:  Recent Labs  04/10/17 0520 04/11/17 0446 04/11/17 1535 04/12/17 0418  LABPROT 34.4* 33.5*  --  28.7*  INR 3.31 3.20  --  2.64  CREATININE  --  1.61*  --  1.52*  TROPONINI  --   --  0.03*  --     Estimated Creatinine Clearance: 20.5 mL/min (A) (by C-G formula based on SCr of 1.52 mg/dL (H)).  Assessment: Pharmacy consulted to dose and monitor warfarin in this 83 year old female taking warfarin PTA for atrial fibrillation.   Home dose: warfarin 5 mg PO Tuesday, Friday, Sunday           Warfarin 7.5 mg PO Mon, Wed, Thurs, Sat Last dose PTA: 7/25 7/26  INR = 3.49 on admission 7/27 INR 4.31; held 7/28     INR= 3.22; 5 mg  7/29     INR= 3.31 held 7/30     INR =3.20 7/31     INR=2.64  Goal of Therapy:  INR 2-3  Plan:  INR today is therapeutic. I will resume warfarin, but at a lower dose than her pervious home dose. Will resume warfarin at 5mg  daily. Follow up INR tomorrow AM. CBC at least q 3 days  Pharmacy will continue to follow.  Ramond Dial, Pharm.D, BCPS Clinical Pharmacist  04/12/2017,7:42 AM

## 2017-04-12 NOTE — Progress Notes (Signed)
Patient Name: [name] Date of Encounter: 04/12/2017  Primary Cardiologist: [facility] Problem List     Principal Problem:   Acute respiratory distress Active Problems:   Acute on chronic combined systolic (congestive) and diastolic (congestive) heart failure (HCC)   Pneumonia   CAD in native artery   Chronic atrial fibrillation (HCC)   AKI (acute kidney injury) ([place])   Hypomagnesemia   Pressure injury of skin     Subjective   Breathing stable to slightly improved. Continues to feel weak. Renal function improving with diuresis. Negative 1.2 L documented for the past 24 hours. TTE with slightly improved EF to 40-45% with severe pulmonary hypertension. Remains on IV Lasix 40 mg bid with KCl repletion.   Inpatient Medications    Scheduled Meds: . calcium carbonate  1,000 mg Oral QPM  . cyanocobalamin  1,000 mcg Intramuscular Q30 days  . docusate sodium  100 mg Oral Daily  . fluticasone  1 spray Each Nare Daily  . furosemide  40 mg Intravenous BID  . ipratropium-albuterol  3 mL Nebulization TID  . levothyroxine  88 mcg Oral Daily  . metoprolol tartrate  12.5 mg Oral BID  . midodrine  5 mg Oral BID WC  . mirtazapine  15 mg Oral QHS  . multivitamin with minerals  1 tablet Oral Daily  . multivitamin-lutein  1 capsule Oral Daily  . nystatin  5 mL Oral QID  . pantoprazole  40 mg Oral Daily  . polyvinyl alcohol  1 drop Both Eyes Daily  . potassium chloride  40 mEq Oral BID  . simvastatin  10 mg Oral QPM  . warfarin  5 mg Oral q1800  . Warfarin - Pharmacist Dosing Inpatient   Does not apply q1800   Continuous Infusions: . ceFEPime (MAXIPIME) IV Stopped (04/11/17 2122)  . sodium chloride     PRN Meds: acetaminophen **OR** acetaminophen, guaiFENesin-dextromethorphan, ipratropium-albuterol, polyvinyl alcohol   Vital Signs    Vitals:   04/11/17 2001 04/12/17 0412 04/12/17 0422 04/12/17 0714  BP: 121/60 126/63    Pulse: 94 91    Resp: 20 18    Temp:  98.2 F (36.8 C) 98.4 F (36.9 C)    TempSrc: Oral Oral    SpO2: 95% 95%  94%  Weight:   123 lb 11.2 oz (56.1 kg)   Height:        Intake/Output Summary (Last 24 hours) at 04/12/17 0905 Last data filed at 04/12/17 0710  Gross per 24 hour  Intake              270 ml  Output             2350 ml  Net            -2080 ml   Filed Weights   04/07/17 1332 04/11/17 1648 04/12/17 0422  Weight: 121 lb (54.9 kg) 114 lb 11.2 oz (52 kg) 123 lb 11.2 oz (56.1 kg)    Physical Exam    GEN: Frail and elderly appearing, in no acute distress.  HEENT: Grossly normal.  Neck: Supple, JVD elevated ~ 6-8 cm, no carotid bruits, or masses. Cardiac: Irregularly irregular, II/Vi systolic murmur, no rubs, or gallops. No clubbing, cyanosis, edema.  Radials/DP/PT 2+ and equal bilaterally.  Respiratory: Diminished breath sounds bilaterally. GI: Soft, nontender, nondistended, BS + x 4. MS: no deformity or atrophy. Skin: warm and dry, no rash. Neuro:  Strength and sensation are intact. Psych: AAOx3.  Normal  affect.  Labs    CBC  Recent Labs  04/12/17 0418  WBC 9.0  HGB 11.3*  HCT 34.1*  MCV 93.8  PLT 765   Basic Metabolic Panel  Recent Labs  04/11/17 0446 04/12/17 0418  NA 143 146*  K 3.3* 4.7  CL 107 106  CO2 28 30  GLUCOSE 100* 117*  BUN 32* 29*  CREATININE 1.61* 1.52*  CALCIUM 8.9 9.2  MG 1.6*  --    Liver Function Tests No results for input(s): AST, ALT, ALKPHOS, BILITOT, PROT, ALBUMIN in the last 72 hours. No results for input(s): LIPASE, AMYLASE in the last 72 hours. Cardiac Enzymes  Recent Labs  04/11/17 1535  TROPONINI 0.03*   BNP Invalid input(s): POCBNP D-Dimer No results for input(s): DDIMER in the last 72 hours. Hemoglobin A1C No results for input(s): HGBA1C in the last 72 hours. Fasting Lipid Panel No results for input(s): CHOL, HDL, LDLCALC, TRIG, CHOLHDL, LDLDIRECT in the last 72 hours. Thyroid Function Tests No results for input(s): TSH, T4TOTAL, T3FREE,  THYROIDAB in the last 72 hours.  Invalid input(s): FREET3  Telemetry    Not on telemetry - Personally Reviewed  ECG    n/a - Personally Reviewed  Radiology    Dg Chest Port 1 View  Result Date: 04/11/2017 IMPRESSION: Stable cardiomegaly with bilateral pulmonary edema and pleural effusions concerning for congestive heart failure. Aortic atherosclerosis. Electronically Signed   By: Marijo Conception, M.D.   On: 04/11/2017 12:32   Dg Chest Port 1 View  Result Date: 04/10/2017 IMPRESSION: Cardiomegaly with diffuse interstitial densities and worsening bibasilar airspace opacities and pleural effusions. This may reflect edema or pneumonia. Electronically Signed   By: Logan Bores M.D.   On: 04/10/2017 10:10    Cardiac Studies   TTE 04/11/2017: Study Conclusions  - Left ventricle: The cavity size was normal. Systolic function was   mildly to moderately reduced. The estimated ejection fraction was   in the range of 40% to 45%. Hypokinesis of the anteroseptal   myocardium. Hypokinesis of the anterior myocardium. The study is   not technically sufficient to allow evaluation of LV diastolic   function. - Mitral valve: There was moderate to severe regurgitation. - Left atrium: The atrium was moderately dilated. - Right ventricle: The cavity size was mildly dilated. Wall   thickness was normal. Systolic function was normal. - Right atrium: The atrium was moderately dilated. - Tricuspid valve: There was severe regurgitation. - Pulmonary arteries: Systolic pressure was severely elevated. PA   peak pressure: 66 mm Hg (S).  Impressions:  - IVC measured 2 to 3 cm, severely dilated.  Patient Profile     83 y.o. female with history of CAD s/p remote PCI x 2 to the LAD in 2004, chronic Afib on Coumadin, chronic combined CHF, pulmonary hypertension, mitral regurgitation, prolonged QT, LBBB, orthostatic hypotension, carotid artery disease, HTN, thrombocytopenia, and chronic back pain who  was admitted on 7/29 with cough and found to have acute respiratory distress 2/2 possible PNA and volume overload.   Assessment & Plan    1. Acute respiratory distress with hypoxia: -Previously required nasal cannula, now on room air -Likely multifactorial including PNA and acute on chronic combined CHF  2. Acute on chronic combined CHF/pulmonary hypertension: -She continues to appear volume overloaded -Continue gentle IV Lasix 40 mg with KCl repletion -Monitor UOP and renal function on IV Lasix -Renal function improving today, no indication for inotropic support at this time -TTE with improved,  though low EF as above along with dilated IVC and severe pulmonary hypertension -Consider consulting Palliative care to discuss goals of care -Continue metoprolol as BP allows, consider changing to long acting metoprolol vs Coreg given cardiomyopathy -Not on ACEi/ARB/Entresto/spironolactone given prior orthostatic hypotension and current AKI -Daily weights -Strict Is & Os  3. Chronic Afib: -Rate controlled -Place on telemetry  -Metoprolol as above -Continue Coumadin per pharmacy -CHADS2VASc at least 6 (CHF, HTN, age x 2, vascular disease, female)  4. PNA: -Per IM  5. AKI: -Improving -Monitor with diuresis  6. Elevated troponin/CAD: -Initial troponin noted to be 0.09, not cycled -Check troponin -TTE as above -Metoprolol as above -On Coumadin in place of ASA  7. Hypomagnesemia: -Replete to goal of 2.0  Signed, Christell Faith, PA-C [place] Pager: [phone] 04/12/2017, 9:05 AM

## 2017-04-12 NOTE — NC FL2 (Addendum)
[place] LEVEL OF CARE SCREENING TOOL     IDENTIFICATION  Patient Name: [name] Birthdate: 26-Aug-1922 Sex: female Admission Date (Current Location): 04/07/2017  [place] and [place] Number:  Engineering geologist and Address:  [facility], [address] [place], [place] [address]      Provider Number: [id number]  Attending Physician Name and Address:  [name], MD  Relative Name and Phone Number:       Current Level of Care: Hospital Recommended Level of Care: [place] Prior Approval Number:    Date Approved/Denied:   PASRR Number:    Discharge Plan: SNF    Current Diagnoses: Patient Active Problem List   Diagnosis Date Noted  . Acute respiratory distress 04/11/2017  . AKI (acute kidney injury) ([place]) 04/11/2017  . Hypomagnesemia 04/11/2017  . Pressure injury of skin 04/08/2017  . Cough 04/07/2017  . Wheezing 04/07/2017  . Pneumonia 04/07/2017  . Muscle weakness (generalized)   . Unsteadiness on feet   . Influenza A   . Encounter for therapeutic drug monitoring 08/24/2016  . Long-term (current) use of anticoagulants 08/24/2016  . Arterial hypotension   . Adjustment disorder with mixed anxiety and depressed mood 06/09/2015  . Generalized weakness 05/12/2015  . Chronic systolic CHF (congestive heart failure) ([place]) 04/29/2015  . Cardiomyopathy, ischemic   . Pulmonary hypertension ([place])   . Adult failure to thrive   . Chronic atrial fibrillation ([place])   . Unstable angina ([place]) 04/07/2015  . Venous stasis dermatitis 06/19/2014  . Orthostatic hypotension   . Acute on chronic combined systolic (congestive) and diastolic (congestive) heart failure ([place]) 03/02/2013  . History of shingles 02/28/2013  . Nail fungus 01/10/2013  . Hyperlipidemia 07/09/2011  . Hearing loss 07/09/2011  . Weight loss   . A-fib ([place])   . CAD in native artery   . Mitral regurgitation   . LBBB (left bundle branch  block)   . Warfarin anticoagulation   . TR (tricuspid regurgitation)   . Dyslipidemia   . Lung abnormality   . Prolonged QT interval   . Coccydynia 12/03/2010  . Nonspecific (abnormal) findings on radiological and other examination of body structure 07/13/2010  . ABNORMAL CHEST XRAY 07/13/2010  . Shortness of breath 06/18/2010  . Essential hypertension 06/24/2009  . GOUT, UNSPECIFIED 03/20/2009  . GERD 03/18/2009  . Thrombocytopenia ([place]) 01/03/2009  . ALLERGIC RHINITIS 12/05/2008  . HOARSENESS 06/07/2008  . MEDIASTINAL LYMPHADENOPATHY 05/13/2008  . Other diseases of lung, not elsewhere classified 04/18/2008  . Venous (peripheral) insufficiency 09/13/2007  . FREQUENCY, URINARY 07/19/2007  . TINNITUS NOS 05/11/2007  . OSTEOPOROSIS NOS 05/11/2007  . HYPOTHYROIDISM 04/11/2007  . ANEMIA, B12 DEFICIENCY 04/11/2007    Orientation RESPIRATION BLADDER Height & Weight     Self, Time, Situation, Place  Normal Continent Weight: 123 lb 11.2 oz (56.1 kg) Height:  5\' 7"  (170.2 cm)  BEHAVIORAL SYMPTOMS/MOOD NEUROLOGICAL BOWEL NUTRITION STATUS      Continent Diet (DYS 3, Nectar Thick, 2 grams Sodium) Extra gravy on meats to moisten well. Extra [employer] on trays. Yogurt TID meals. Pt may have Cream Soups per Speech in a Mug.Supplements with meals:  Magic Cup and Mighty Shakes II  AMBULATORY STATUS COMMUNICATION OF NEEDS Skin   Extensive Assist Verbally Normal                       Personal Care Assistance Level of Assistance  Bathing, Feeding, Dressing  Bathing Assistance: Limited assistance Feeding assistance: Independent Dressing Assistance: Limited assistance     Functional Limitations Info  Sight, Hearing, Speech Sight Info: Adequate Hearing Info: Adequate Speech Info: Adequate    SPECIAL CARE FACTORS FREQUENCY  PT (By licensed PT), Speech therapy                    Contractures Contractures Info: Not present    Additional Factors Info  Code Status,  Allergies, Psychotropic Code Status Info: DNR Allergies Info: Sulfonamide Derivatives, Alendronate Sodium Psychotropic Info: Medications:  Remeron         Current Medications (04/12/2017):  This is the current hospital active medication list Current Facility-Administered Medications  Medication Dose Route Frequency Provider Last Rate Last Dose  . acetaminophen (TYLENOL) tablet 650 mg  650 mg Oral Q6H PRN Baxter Hire, MD   650 mg at 04/09/17 2248   Or  . acetaminophen (TYLENOL) suppository 650 mg  650 mg Rectal Q6H PRN Baxter Hire, MD      . calcium carbonate (TUMS - dosed in mg elemental calcium) chewable tablet 1,000 mg  1,000 mg Oral QPM Baxter Hire, MD   1,000 mg at 04/11/17 1719  . ceFEPIme (MAXIPIME) 1 g in dextrose 5 % 50 mL IVPB  1 g Intravenous q1800 [name], MD   Stopped at 04/11/17 2122  . cyanocobalamin ((VITAMIN B-12)) injection 1,000 mcg  1,000 mcg Intramuscular Q30 days Baxter Hire, MD      . docusate sodium (COLACE) capsule 100 mg  100 mg Oral Daily Baxter Hire, MD   100 mg at 04/12/17 1002  . fluticasone (FLONASE) 50 MCG/ACT nasal spray 1 spray  1 spray Each Nare Daily Baxter Hire, MD   1 spray at 04/12/17 1002  . furosemide (LASIX) injection 40 mg  40 mg Intravenous BID Christell Faith M, PA-C   40 mg at 04/12/17 4081  . guaiFENesin-dextromethorphan (ROBITUSSIN DM) 100-10 MG/5ML syrup 5 mL  5 mL Oral Q4H PRN Baxter Hire, MD   5 mL at 04/11/17 0843  . ipratropium-albuterol (DUONEB) 0.5-2.5 (3) MG/3ML nebulizer solution 3 mL  3 mL Nebulization Q6H PRN [name], MD      . levothyroxine (SYNTHROID, LEVOTHROID) tablet 88 mcg  88 mcg Oral Daily Baxter Hire, MD   88 mcg at 04/12/17 0600  . metoprolol tartrate (LOPRESSOR) tablet 12.5 mg  12.5 mg Oral BID Baxter Hire, MD   12.5 mg at 04/12/17 1125  . midodrine (PROAMATINE) tablet 5 mg  5 mg Oral BID WC Baxter Hire, MD   5 mg at 04/12/17 1002  . mirtazapine (REMERON) tablet  15 mg  15 mg Oral QHS Baxter Hire, MD   15 mg at 04/11/17 2052  . multivitamin with minerals tablet 1 tablet  1 tablet Oral Daily Baxter Hire, MD   1 tablet at 04/12/17 1002  . multivitamin-lutein (OCUVITE-LUTEIN) capsule 1 capsule  1 capsule Oral Daily Baxter Hire, MD   1 capsule at 04/12/17 1002  . nystatin (MYCOSTATIN) 100000 UNIT/ML suspension 500,000 Units  5 mL Oral QID Vaughan Basta, MD   500,000 Units at 04/12/17 1005  . pantoprazole (PROTONIX) EC tablet 40 mg  40 mg Oral Daily Baxter Hire, MD   40 mg at 04/12/17 1002  . polyvinyl alcohol (LIQUIFILM TEARS) 1.4 % ophthalmic solution 1 drop  1 drop Both Eyes Daily PRN Baxter Hire, MD      .  polyvinyl alcohol (LIQUIFILM TEARS) 1.4 % ophthalmic solution 1 drop  1 drop Both Eyes Daily [name], MD   1 drop at 04/12/17 1002  . potassium chloride SA (K-DUR,KLOR-CON) CR tablet 40 mEq  40 mEq Oral BID Christell Faith M, PA-C   40 mEq at 04/12/17 1002  . simvastatin (ZOCOR) tablet 10 mg  10 mg Oral QPM Baxter Hire, MD   10 mg at 04/11/17 1719  . sodium chloride 0.9 % bolus 500 mL  500 mL Intravenous Once Schuyler Amor, MD      . warfarin (COUMADIN) tablet 5 mg  5 mg Oral q1800 Ramond Dial, RPH      . Warfarin - Pharmacist Dosing Inpatient   Does not apply q1800 Larene [place], [facility]   Stopped at 04/10/17 1800     Discharge Medications: Please see discharge summary for a list of discharge medications.  Relevant Imaging Results:  Relevant Lab Results:   Additional Information SSN:  [id number]   Darden Dates, LCSW

## 2017-04-12 NOTE — Progress Notes (Signed)
[facility] [place] NAME: [name]    MR#:  [id number]  DATE OF BIRTH:  1921/09/27  SUBJECTIVE:   Shortness of breath, cough has improved. Responding well to IV diuresis. Seen by cardiology yesterday and continue current management with IV diuresis for underlying CHF. Daughter is at bedside.  REVIEW OF SYSTEMS:    Review of Systems  Constitutional: Negative for chills and fever.  HENT: Negative for congestion and tinnitus.   Eyes: Negative for blurred vision and double vision.  Respiratory: Positive for cough and shortness of breath. Negative for wheezing.   Cardiovascular: Negative for chest pain, orthopnea and PND.  Gastrointestinal: Negative for abdominal pain, diarrhea, nausea and vomiting.  Genitourinary: Negative for dysuria and hematuria.  Neurological: Positive for weakness. Negative for dizziness, sensory change and focal weakness.  All other systems reviewed and are negative.   Nutrition: Dysphagia II, nectar thick liquids.  Tolerating Diet: Yes Tolerating PT: Eval noted.   DRUG ALLERGIES:   Allergies  Allergen Reactions  . Sulfonamide Derivatives Nausea And Vomiting    REACTION: sick  . Alendronate Sodium Other (See Comments)    REACTION: GI    VITALS:  Blood pressure 126/63, pulse 91, temperature 98.4 F (36.9 C), temperature source Oral, resp. rate 18, height 5\' 7"  (1.702 m), weight 56.1 kg (123 lb 11.2 oz), SpO2 94 %.  PHYSICAL EXAMINATION:   Physical Exam  GENERAL:  83 y.o.-year-old patient sitting up in chair in mild resp. Distress.  EYES: Pupils equal, round, reactive to light and accommodation. No scleral icterus. Extraocular muscles intact.  HEENT: Head atraumatic, normocephalic. Oropharynx and nasopharynx clear.  NECK:  Supple, no jugular venous distention. No thyroid enlargement, no tenderness.  LUNGS: Good A/E b/l, no wheezing, rales, rhonchi. No use of accessory muscles of respiration.   CARDIOVASCULAR: S1, S2 normal. No murmurs, rubs, or gallops.  ABDOMEN: Soft, nontender, nondistended. Bowel sounds present. No organomegaly or mass.  EXTREMITIES: No cyanosis, clubbing or edema b/l.    NEUROLOGIC: Cranial nerves II through XII are intact. No focal Motor or sensory deficits b/l.  Globally weak.  PSYCHIATRIC: The patient is alert and oriented x 3.  SKIN: No obvious rash, lesion, or ulcer.    LABORATORY PANEL:   CBC  Recent Labs Lab 04/12/17 0418  WBC 9.0  HGB 11.3*  HCT 34.1*  PLT 201   ------------------------------------------------------------------------------------------------------------------  Chemistries   Recent Labs Lab 04/09/17 0503 04/11/17 0446 04/12/17 0418  NA 137 143 146*  K 4.0 3.3* 4.7  CL 110 107 106  CO2 22 28 30   GLUCOSE 98 100* 117*  BUN 39* 32* 29*  CREATININE 1.54* 1.61* 1.52*  CALCIUM 7.8* 8.9 9.2  MG  --  1.6*  --   AST 34  --   --   ALT 19  --   --   ALKPHOS 71  --   --   BILITOT 0.9  --   --    ------------------------------------------------------------------------------------------------------------------  Cardiac Enzymes  Recent Labs Lab 04/11/17 1535  TROPONINI 0.03*   ------------------------------------------------------------------------------------------------------------------  RADIOLOGY:  Dg Chest Port 1 View  Result Date: 04/11/2017 CLINICAL DATA:  Shortness of breath. EXAM: PORTABLE CHEST 1 VIEW COMPARISON:  Radiograph of April 10, 2017. FINDINGS: Stable cardiomegaly. Atherosclerosis of thoracic aorta is noted. No pneumothorax is noted. Stable bilateral diffuse interstitial densities are noted concerning for pulmonary edema with associated bilateral pleural effusions. Bony thorax is unremarkable. IMPRESSION: Stable cardiomegaly with bilateral pulmonary edema and  pleural effusions concerning for congestive heart failure. Aortic atherosclerosis. Electronically Signed   By: Marijo Conception, M.D.   On:  04/11/2017 12:32     ASSESSMENT AND PLAN:   83 year old female with past medical history of hypertension, COPD, osteoarthritis, chronic atrial fibrillation, history of coronary artery disease, hyperlipidemia, GERD, previous history of MI who presented to the hospital due to shortness of breath, cough.  1. Acute respiratory failure with hypoxia-suspected be secondary to pneumonia and also CHF.  -Continue IV Cefepime, incentive spirometry and antitussives.  -Continue diuresis with IV Lasix, follow I's and O's and daily weights. Wean off oxygen as tolerated. Clinically improved since yesterday.  2. Pneumonia-continue IV cefepime, follow blood, sputum cultures which are (-) so far.  -Continue antitussives and supportive care. Improving.  3. CHF-acute on chronic systolic dysfunction.  -Diuresing well with IV Lasix and as per cardiology continue IV diuresis. Patient is about 2 L negative since yesterday. -Appreciate cardiology input and continue current care. Continue metoprolol, hold ACE due to AKI and previous Orthostatic Hypotension.   4. Acute kidney injury- ?? Due To CHF.  Cr. At baseline now and will watch with diuresis.  - Renal dose meds and avoid nephrotoxins.   5. Hypothyroidism-continue Synthroid.  6. Essential hypertension-continue metoprolol.  7. Hyperlipidemia-continue simvastatin.  8. GERD-continue Protonix.  9. History of chronic atrial fibrillation-rate controlled. Continue metoprolol. Continue Coumadin, INR therapeutic presently.  10. Hypokalemia/Hypomagnesemia - cont. To replace and will repeat in a.m.    Seen by PT and they recommend SNF/STR now and Social Work aware.  All the records are reviewed and case discussed with Care Management/Social Worker. Management plans discussed with the patient, family and they are in agreement.  CODE STATUS: DNR  DVT Prophylaxis: Warfarin  TOTAL TIME TAKING CARE OF THIS PATIENT: 30 minutes.   POSSIBLE D/C IN 1-2 DAYS,  DEPENDING ON CLINICAL CONDITION.   [name] M.D on 04/12/2017 at 1:36 PM  Between 7am to 6pm - Pager - [phone]  After 6pm go to [email or web address] - Technical brewer [facility]  Office  [phone]  CC: Primary care physician; Tower, Wynelle Fanny, MD

## 2017-04-12 NOTE — Progress Notes (Signed)
Physical Therapy Treatment Patient Details Name: [name] MRN: [id number] DOB: April 26, 1922 Today's Date: 04/12/2017    History of Present Illness Pt is a 83 y.o. female presenting to hospital with productive cough and fever.  Pt admitted with PNA and AKI.  PMH includes OA, CAD, chronic a-fib, systolic CHF, compression fx L-spine, COPD, LBBB, MI, orthostatic hypotension.    PT Comments    Pt agrees to session but reports feeling "bad".  Stood with min assist and was able to ambulate with walker and min assist to door and back to bedside commode.  After voiding she was able to stand for self care then ambulate 15' back to recliner.  Generally fatigued with mobility.  HR monitored.  107 at rest, dipped to 80's upon getting back to commode, increased to 135 while voiding then back to 100's at rest.  Daughter in for session.   Follow Up Recommendations  SNF     Equipment Recommendations       Recommendations for Other Services       Precautions / Restrictions Precautions Precautions: Fall Restrictions Weight Bearing Restrictions: No    Mobility  Bed Mobility               General bed mobility comments: in chair and remained up after session  Transfers Overall transfer level: Needs assistance Equipment used: Rolling walker (2 wheeled) Transfers: Sit to/from Stand Sit to Stand: Min assist         General transfer comment: increased effort to stand; assist to initiate stand and come to upright posture (x2 trials)  Ambulation/Gait Ambulation/Gait assistance: Min guard Ambulation Distance (Feet): 20 Feet Assistive device: Rolling walker (2 wheeled) Gait Pattern/deviations: Step-through pattern Gait velocity: decreased Gait velocity interpretation: <1.8 ft/sec, indicative of risk for recurrent falls General Gait Details: decreased B step length/foot clearance/heelstrike; forward flexed posture   Stairs            Wheelchair Mobility    Modified  Rankin (Stroke Patients Only)       Balance Overall balance assessment: Needs assistance Sitting-balance support: No upper extremity supported;Feet supported Sitting balance-Leahy Scale: Fair Sitting balance - Comments: static sitting   Standing balance support: Bilateral upper extremity supported Standing balance-Leahy Scale: Poor Standing balance comment: requires UE support to maintain balance                            Cognition Arousal/Alertness: Awake/alert Behavior During Therapy: WFL for tasks assessed/performed Overall Cognitive Status: Within Functional Limits for tasks assessed                                        Exercises Other Exercises Other Exercises: to bedside commde - min gaurd for self care    General Comments        Pertinent Vitals/Pain Pain Assessment: No/denies pain    Home Living                      Prior Function            PT Goals (current goals can now be found in the care plan section) Progress towards PT goals: Progressing toward goals    Frequency    Min 2X/week      PT Plan Current plan remains appropriate    Co-evaluation  AM-PAC PT "6 Clicks" Daily Activity  Outcome Measure  Difficulty turning over in bed (including adjusting bedclothes, sheets and blankets)?: A Little Difficulty moving from lying on back to sitting on the side of the bed? : A Little Difficulty sitting down on and standing up from a chair with arms (e.g., wheelchair, bedside commode, etc,.)?: Total Help needed moving to and from a bed to chair (including a wheelchair)?: A Little Help needed walking in hospital room?: A Little Help needed climbing 3-5 steps with a railing? : A Lot 6 Click Score: 15    End of Session Equipment Utilized During Treatment: Gait belt Activity Tolerance: Patient limited by fatigue Patient left: in chair;with chair alarm set;with call bell/phone within reach;with  family/visitor present Nurse Communication: Other (comment)       Time: 8833-7445 PT Time Calculation (min) (ACUTE ONLY): 18 min  Charges:  $Gait Training: 8-22 mins                    G Codes:       Chesley Noon, PTA 04/12/17, 11:53 AM

## 2017-04-13 LAB — BASIC METABOLIC PANEL
Anion gap: 9 (ref 5–15)
BUN: 32 mg/dL — AB (ref 6–20)
CALCIUM: 9.4 mg/dL (ref 8.9–10.3)
CO2: 33 mmol/L — ABNORMAL HIGH (ref 22–32)
CREATININE: 1.66 mg/dL — AB (ref 0.44–1.00)
Chloride: 105 mmol/L (ref 101–111)
GFR calc Af Amer: 30 mL/min — ABNORMAL LOW (ref >60)
GFR, EST NON AFRICAN AMERICAN: 25 mL/min — AB (ref >60)
GLUCOSE: 106 mg/dL — AB (ref 65–99)
Potassium: 5.1 mmol/L (ref 3.5–5.1)
Sodium: 147 mmol/L — ABNORMAL HIGH (ref 135–145)

## 2017-04-13 LAB — PROTIME-INR
INR: 2.5
PROTHROMBIN TIME: 27.5 s — AB (ref 11.4–15.2)

## 2017-04-13 LAB — CREATININE, SERUM
Creatinine, Ser: 1.88 mg/dL — ABNORMAL HIGH (ref 0.44–1.00)
GFR calc Af Amer: 25 mL/min — ABNORMAL LOW (ref >60)
GFR calc non Af Amer: 22 mL/min — ABNORMAL LOW (ref >60)

## 2017-04-13 LAB — MAGNESIUM: Magnesium: 1.9 mg/dL (ref 1.7–2.4)

## 2017-04-13 LAB — POTASSIUM: Potassium: 4.7 mmol/L (ref 3.5–5.1)

## 2017-04-13 MED ORDER — POTASSIUM CHLORIDE CRYS ER 10 MEQ PO TBCR: 10.0000 meq | EXTENDED_RELEASE_TABLET | Freq: Two times a day (BID) | ORAL | Status: DC

## 2017-04-13 MED ORDER — FUROSEMIDE 10 MG/ML IJ SOLN
20.0000 mg | Freq: Once | INTRAMUSCULAR | Status: AC
  Administered 2017-04-13: 20 mg via INTRAVENOUS
  Filled 2017-04-13: qty 2

## 2017-04-13 MED ORDER — FUROSEMIDE 10 MG/ML IJ SOLN
20.0000 mg | Freq: Two times a day (BID) | INTRAMUSCULAR | Status: DC
  Administered 2017-04-13 – 2017-04-14 (×2): 20 mg via INTRAVENOUS
  Filled 2017-04-13 (×2): qty 2

## 2017-04-13 NOTE — Clinical Social Work Note (Signed)
Clinical Social Work Assessment  Patient Details  Name: [name] MRN: [id number] Date of Birth: 1922-01-15  Date of referral:  04/13/17               Reason for consult:  Facility Placement, Discharge Planning                Permission sought to share information with:  Family Supports Permission granted to share information::  Yes, Verbal Permission Granted  Name::     Crisoforo [place]  Relationship::  daughter  Contact Information:  [phone]  Housing/Transportation Living arrangements for the past 2 months:  [place] of Information:  Adult Children Patient Interpreter Needed:  None Criminal Activity/Legal Involvement Pertinent to Current Situation/Hospitalization:  No - Comment as needed Significant Relationships:  Adult Children Lives with:  Self Do you feel safe going back to the place where you live?  No (Pt is needs a higher level of care) Need for family participation in patient care:  Yes (Comment)  Care giving concerns:  Pt is in need of a higher level of care.   Social Worker assessment / plan:  CSW met with pt to address consult for New SNF. CSW introduced herself and explained role of social work. CSW also explained the process of discharging to SNF with a managed medicare. CSW initiatied SNF search and followed up with bed offers. Pt's daughter chose [facility]. Faciltiy will be able to accept with stable. CSW will continue to follow.   Employment status:  Retired Nurse, adult PT Recommendations:  [place] / Referral to community resources:  [place]  Patient/Family's Response to care:  Pt's daughter was Patent attorney of CSW support.   Patient/Family's Understanding of and Emotional Response to Diagnosis, Current Treatment, and Prognosis:  Pt's daughter understands that pt would benefit from STR prior to returning home.   Emotional Assessment Appearance:   Appears stated age Attitude/Demeanor/Rapport:   (Appropriate) Affect (typically observed):  Accepting, Adaptable, Pleasant Orientation:  Oriented to Situation, Oriented to  Time, Oriented to Self, Oriented to Place Alcohol / Substance use:  Not Applicable Psych involvement (Current and /or in the community):  No (Comment)  Discharge Needs  Concerns to be addressed:  Adjustment to Illness Readmission within the last 30 days:  No Current discharge risk:  Chronically ill Barriers to Discharge:  Continued Medical Work up   [employer], LCSW 04/13/2017, 5:01 PM

## 2017-04-13 NOTE — Progress Notes (Addendum)
[facility] [place] NAME: [name]    MR#:  [id number]  DATE OF BIRTH:  06/16/1922  SUBJECTIVE:   Better shortness of breath and cough, but has generalized weakness.  REVIEW OF SYSTEMS:    Review of Systems  Constitutional: Positive for malaise/fatigue. Negative for chills and fever.  HENT: Negative for congestion and tinnitus.   Eyes: Negative for blurred vision and double vision.  Respiratory: Positive for cough and shortness of breath. Negative for sputum production and wheezing.   Cardiovascular: Negative for chest pain, orthopnea and PND.  Gastrointestinal: Negative for abdominal pain, diarrhea, nausea and vomiting.  Genitourinary: Negative for dysuria and hematuria.  Neurological: Positive for weakness. Negative for dizziness, sensory change and focal weakness.  All other systems reviewed and are negative.   Nutrition: Dysphagia II, nectar thick liquids.  Tolerating Diet: Yes Tolerating PT: Eval noted.   DRUG ALLERGIES:   Allergies  Allergen Reactions  . Sulfonamide Derivatives Nausea And Vomiting    REACTION: sick  . Alendronate Sodium Other (See Comments)    REACTION: GI    VITALS:  Blood pressure 116/66, pulse 81, temperature 98.7 F (37.1 C), resp. rate 18, height 5\' 7"  (1.702 m), weight 118 lb 14.4 oz (53.9 kg), SpO2 94 %.  PHYSICAL EXAMINATION:   Physical Exam  GENERAL:  83 y.o.-year-old patient sitting up in chair in mild resp. Distress.  EYES: Pupils equal, round, reactive to light and accommodation. No scleral icterus. Extraocular muscles intact.  HEENT: Head atraumatic, normocephalic. Oropharynx and nasopharynx clear.  NECK:  Supple, no jugular venous distention. No thyroid enlargement, no tenderness.  LUNGS: Good A/E b/l, no wheezing, mild bilateral basilar rales. No use of accessory muscles of respiration.  CARDIOVASCULAR: S1, S2 normal. No murmurs, rubs, or gallops.  ABDOMEN: Soft, nontender,  nondistended. Bowel sounds present. No organomegaly or mass.  EXTREMITIES: No cyanosis, clubbing or edema b/l.    NEUROLOGIC: Cranial nerves II through XII are intact. No focal Motor or sensory deficits b/l.  Globally weak 2-3/5.  PSYCHIATRIC: The patient is alert and oriented x 3.  SKIN: No obvious rash, lesion, or ulcer. But has bruises on legs.   LABORATORY PANEL:   CBC  Recent Labs Lab 04/12/17 0418  WBC 9.0  HGB 11.3*  HCT 34.1*  PLT 201   ------------------------------------------------------------------------------------------------------------------  Chemistries   Recent Labs Lab 04/09/17 0503  04/13/17 0447 04/13/17 1517  NA 137  < > 147*  --   K 4.0  < > 5.1 4.7  CL 110  < > 105  --   CO2 22  < > 33*  --   GLUCOSE 98  < > 106*  --   BUN 39*  < > 32*  --   CREATININE 1.54*  < > 1.66* 1.88*  CALCIUM 7.8*  < > 9.4  --   MG  --   < > 1.9  --   AST 34  --   --   --   ALT 19  --   --   --   ALKPHOS 71  --   --   --   BILITOT 0.9  --   --   --   < > = values in this interval not displayed. ------------------------------------------------------------------------------------------------------------------  Cardiac Enzymes  Recent Labs Lab 04/11/17 1535  TROPONINI 0.03*   ------------------------------------------------------------------------------------------------------------------  RADIOLOGY:  No results found.   ASSESSMENT AND PLAN:   83 year old female with past medical history of  hypertension, COPD, osteoarthritis, chronic atrial fibrillation, history of coronary artery disease, hyperlipidemia, GERD, previous history of MI who presented to the hospital due to shortness of breath, cough.  1. Acute respiratory failure with hypoxia-suspected be secondary to pneumonia and also CHF.  -Continue IV Cefepime, incentive spirometry and antitussives.  -Continue diuresis with IV Lasix, follow I's and O's and daily weights. Weaned off oxygen. Clinically  improved.  2. Pneumonia-continue IV cefepime, follow blood, sputum cultures which are (-) so far.  -Continue antitussives and supportive care. Improving.  3. CHF-acute on chronic systolic dysfunction.  -Diuresing well with IV Lasix and as per cardiology  Decrease IV Lasix to 20 mg twice a day due to worsening renal function. Continue metoprolol, hold ACE due to AKI and previous Orthostatic Hypotension.   4. Acute kidney injury- possibly ATN. Due to lasix and CHF.  Decreased IV Lasix dose. Renal dose meds and avoid nephrotoxins.  Follow-up BMP.  5. Hypothyroidism-continue Synthroid.  6. Essential hypertension-continue metoprolol.  7. Hyperlipidemia-continue simvastatin.  8. GERD-continue Protonix.  9. History of chronic atrial fibrillation-rate controlled. Continue metoprolol. Continue Coumadin, INR therapeutic presently.  10. Hypokalemia/Hypomagnesemia - cont. To replace and will repeat in a.m.    * Severe malnutrition in context of chronic illness. Follow-up dietitian recommendation.  Poor prognosis, palliative care consult. Seen by PT and they recommend SNF/STR now and Social Work aware.  All the records are reviewed and case discussed with Care Management/Social Worker. Management plans discussed with the patient, her daughter and they are in agreement.  CODE STATUS: DNR  DVT Prophylaxis: Warfarin  TOTAL TIME TAKING CARE OF THIS PATIENT: 36 minutes.   POSSIBLE D/C IN 1-2 DAYS, DEPENDING ON CLINICAL CONDITION.   Demetrios Loll M.D on 04/13/2017 at 4:30 PM  Between 7am to 6pm - Pager - [phone]  After 6pm go to [email or web address] - Technical brewer [facility]  Office  [phone]  CC: Primary care physician; Tower, Wynelle Fanny, MD

## 2017-04-13 NOTE — Progress Notes (Signed)
Speech Language Pathology Treatment: Dysphagia  Patient Details Name: [name] MRN: [id number] DOB: 11-24-1921 Today's Date: 04/13/2017 Time: 1230-1300 SLP Time Calculation (min) (ACUTE ONLY): 30 min  Assessment / Plan / Recommendation Clinical Impression  Pt seen for toleration of current dysphagia diet including Nectar consistency liquids at this time d/t pt's overall weakness, baseline dysphagia, and risk for aspiration during this acute time. Pt has been recommended to use swallowing strategies d/t pharyngeal phase dysphagia per MBSS in past. During this acute illness time and pt's presentation of increased weakness and fatigue, pt has been recommended to utilize Nectar consistency liquids by ST services. Daughter aware, agreed.  Pt appears to adequately tolerate trials of Nectar consistency liquids and softened solids w/ no immediate, overt s/s of aspiration. Oral phase fairly adequate for bolus management of the diet consistency. Daughter has noted decreased oral intake overall and stated MD wants to "get more calories in her". Pt has been attempting to eat Magic Cup as one of her supplements per Dietician. Thorough discussion and education w/ Daughter re: po supplement options for pt in the appropriate consistency for pt - Nectar consistency; ie. Mighty Shakes. Recommended Daughter have f/u w/ Dietician for further ideas for pt such as frequent grazing, mini meals. Discussed general aspiration precautions w/ the current dysphagia diet; did not recommend upgrade back to thin liquids w/ her baseline swallowing strategies until pt is much improved in her overall strength and stamina for the demand of the task of oral intake w/ strategies. Daughter agreed.  Recommend pt f/u w/ ST services at SNF for ongoing management and education of her dysphagia(baseline); appropriate time to attempt trials of thin liquids to upgrade diet(if appropriate). Education on precautions; diet preparation and  options. NSG/CM updated.     HPI HPI: Pt is a 83 y.o. female who was admitted w/ cough, weakness, AKI. Patient has multiple medical issues including h/o GERD, shingles, CAD s/p remote PCI x 2 to the LAD in 2004, chronic Afib on Coumadin, chronic combined CHF, pulmonary hypertension, mitral regurgitation, prolonged QT, LBBB, orthostatic hypotension, carotid artery disease, HTN, thrombocytopenia, and chronic back pain who was admitted on 7/29 with cough and found to have PNA. Pt has been vomiting and expectorating foamy phlegm - this could have been contributing to her decline in Pulmonary status d/t the increased risk for aspiration of such phlegm. Pt has a h/o dysphagia; MBSS in 2016 revealed delayed pharyngeal swalllow initiation w/ thin liquids. Pt was recommended to use aspiration precautions and swallowing strategies including a chin tuck when swallowing liquids. Pt stated she has been following the recommendations and this has reduced the coughing, "strangling", but not fully preventing it. Pt and Daughter denied any increase in swallowing issues prior to admission, however, pt is much weaker than usual. Cardiologist if following pt indicating cardiomegaly with bilateral pulmonary edema and pleural effusions concerning for congestive heart failure. Pt continues to appear volume overloaded per MD; recommended consulting Palliative care to discuss goals of care per his note.      SLP Plan  Continue with current plan of care       Recommendations  Diet recommendations: Dysphagia 3 (mechanical soft);Nectar-thick liquid (well-broken down soft solids) Liquids provided via: Cup;Straw (monitor all straw use) Medication Administration: Whole meds with puree (crushed in puree as needed) Supervision: Patient able to self feed;Staff to assist with self feeding;Full supervision/cueing for compensatory strategies Compensations: Minimize environmental distractions;Slow rate;Small sips/bites;Multiple dry  swallows after each bite/sip;Follow solids with liquid Postural Changes  and/or Swallow Maneuvers: Seated upright 90 degrees;Upright 30-60 min after meal                General recommendations:  (Dietician f/u ongoing) Oral Care Recommendations: Oral care BID;Staff/trained caregiver to provide oral care Follow up Recommendations: Skilled Nursing facility SLP Visit Diagnosis: Dysphagia, oropharyngeal phase (R13.12) Plan: Continue with current plan of care       [place], [place], CCC-SLP Watson,Katherine 04/13/2017, 3:17 PM

## 2017-04-13 NOTE — Progress Notes (Signed)
Patient Name: [name] Date of Encounter: 04/13/2017  Primary Cardiologist: [facility] Problem List     Principal Problem:   Acute respiratory distress Active Problems:   Acute on chronic combined systolic (congestive) and diastolic (congestive) heart failure (HCC)   Pneumonia   CAD in native artery   Chronic atrial fibrillation (HCC)   AKI (acute kidney injury) ([place])   Hypomagnesemia   Pressure injury of skin     Subjective   SOB about the same. Got up to recliner on 7/31. UOP 3.2 L for the past 24 hours. Weight 118 pounds (5 pounds down from 7/31). Renal function with a slight bump this AM from 1.52-->1.66. Potassium at goal. Magnesium improving. INR therapeutic.  WBC normal, HGB low but stable.   Inpatient Medications    Scheduled Meds: . calcium carbonate  1,000 mg Oral QPM  . cyanocobalamin  1,000 mcg Intramuscular Q30 days  . docusate sodium  100 mg Oral Daily  . fluticasone  1 spray Each Nare Daily  . furosemide  40 mg Intravenous BID  . levothyroxine  88 mcg Oral Daily  . metoprolol tartrate  12.5 mg Oral BID  . midodrine  5 mg Oral BID WC  . mirtazapine  15 mg Oral QHS  . multivitamin with minerals  1 tablet Oral Daily  . multivitamin-lutein  1 capsule Oral Daily  . nystatin  5 mL Oral QID  . pantoprazole  40 mg Oral Daily  . polyvinyl alcohol  1 drop Both Eyes Daily  . potassium chloride  40 mEq Oral BID  . simvastatin  10 mg Oral QPM  . warfarin  5 mg Oral q1800  . Warfarin - Pharmacist Dosing Inpatient   Does not apply q1800   Continuous Infusions: . ceFEPime (MAXIPIME) IV Stopped (04/12/17 1906)  . sodium chloride     PRN Meds: acetaminophen **OR** acetaminophen, guaiFENesin-dextromethorphan, ipratropium-albuterol, polyvinyl alcohol   Vital Signs    Vitals:   04/12/17 1420 04/12/17 2031 04/13/17 0542 04/13/17 0625  BP: (!) 113/53 (!) 117/56 116/66   Pulse: 83 90 81   Resp: 18 20 18    Temp: 98.3 F (36.8 C) 98.1 F (36.7 C)  98.7 F (37.1 C)   TempSrc: Oral Oral    SpO2: 95% 93% 94%   Weight:    118 lb 14.4 oz (53.9 kg)  Height:        Intake/Output Summary (Last 24 hours) at 04/13/17 0808 Last data filed at 04/13/17 0622  Gross per 24 hour  Intake               50 ml  Output             1700 ml  Net            -1650 ml   Filed Weights   04/11/17 1648 04/12/17 0422 04/13/17 0625  Weight: 114 lb 11.2 oz (52 kg) 123 lb 11.2 oz (56.1 kg) 118 lb 14.4 oz (53.9 kg)    Physical Exam    GEN: Frail and elderly appearing, in no acute distress.  HEENT: Grossly normal.  Neck: Supple, JVD elevated ~ 12 cm, no carotid bruits, or masses. Cardiac: Irregularly irregular, II/VI systolic murmur, no rubs, or gallops. No clubbing, cyanosis, edema.  Radials/DP/PT 2+ and equal bilaterally.  Respiratory:  Diminished breath sounds bilaterally. GI: Soft, nontender, nondistended, BS + x 4. MS: no deformity or atrophy. Skin: warm and dry, no rash. Neuro:  Strength and  sensation are intact. Psych: AAOx3.  Normal affect.  Labs    CBC  Recent Labs  04/12/17 0418  WBC 9.0  HGB 11.3*  HCT 34.1*  MCV 93.8  PLT 161   Basic Metabolic Panel  Recent Labs  04/11/17 0446 04/12/17 0418 04/13/17 0447  NA 143 146* 147*  K 3.3* 4.7 5.1  CL 107 106 105  CO2 28 30 33*  GLUCOSE 100* 117* 106*  BUN 32* 29* 32*  CREATININE 1.61* 1.52* 1.66*  CALCIUM 8.9 9.2 9.4  MG 1.6*  --  1.9   Liver Function Tests No results for input(s): AST, ALT, ALKPHOS, BILITOT, PROT, ALBUMIN in the last 72 hours. No results for input(s): LIPASE, AMYLASE in the last 72 hours. Cardiac Enzymes  Recent Labs  04/11/17 1535  TROPONINI 0.03*   BNP Invalid input(s): POCBNP D-Dimer No results for input(s): DDIMER in the last 72 hours. Hemoglobin A1C No results for input(s): HGBA1C in the last 72 hours. Fasting Lipid Panel No results for input(s): CHOL, HDL, LDLCALC, TRIG, CHOLHDL, LDLDIRECT in the last 72 hours. Thyroid Function Tests No  results for input(s): TSH, T4TOTAL, T3FREE, THYROIDAB in the last 72 hours.  Invalid input(s): FREET3  Telemetry     Afib - Personally Reviewed  ECG    n/a - Personally Reviewed  Radiology    Dg Chest Port 1 View  Result Date: 04/11/2017 IMPRESSION: Stable cardiomegaly with bilateral pulmonary edema and pleural effusions concerning for congestive heart failure. Aortic atherosclerosis. Electronically Signed   By: Marijo Conception, M.D.   On: 04/11/2017 12:32    Cardiac Studies   TTE 04/11/2017: Study Conclusions  - Left ventricle: The cavity size was normal. Systolic function was mildly to moderately reduced. The estimated ejection fraction was in the range of 40% to 45%. Hypokinesis of the anteroseptal myocardium. Hypokinesis of the anterior myocardium. The study is not technically sufficient to allow evaluation of LV diastolic function. - Mitral valve: There was moderate to severe regurgitation. - Left atrium: The atrium was moderately dilated. - Right ventricle: The cavity size was mildly dilated. Wall thickness was normal. Systolic function was normal. - Right atrium: The atrium was moderately dilated. - Tricuspid valve: There was severe regurgitation. - Pulmonary arteries: Systolic pressure was severely elevated. PA peak pressure: 66 mm Hg (S).  Impressions:  - IVC measured 2 to 3 cm, severely dilated.  Patient Profile     83 y.o. female with history of CAD s/p remote PCI x 2 to the LAD in 2004, chronic Afib on Coumadin, chronic combined CHF, pulmonary hypertension, mitral regurgitation, prolonged QT, LBBB, orthostatic hypotension, carotid artery disease, HTN, thrombocytopenia, and chronic back pain who was admitted on 7/29 with cough and found to have acute respiratory distress 2/2 possible PNA and volume overload.   Assessment & Plan    1. Acute respiratory distress with hypoxia: -Previously required nasal cannula, now on room air -Likely  multifactorial including PNA and acute on chronic combined CHF  2. Acute on chronic combined CHF/pulmonary hypertension: -She continues to appear volume overloaded -Good UOP on 7/31, mild bump in SCr on 8/1 -Decrease IV Lasix to 20 mg bid with KCl repletion -Monitor UOP and renal function on IV Lasix -If renal function continues to trend upwards this afternoon, consider starting inotrope -TTE with improved, though low EF as above along with dilated IVC and severe pulmonary hypertension -Consider consulting Palliative care to discuss goals of care -Continue metoprolol as BP allows, consider changing to long  acting metoprolol vs Coreg given cardiomyopathy -Not on ACEi/ARB/Entresto/spironolactone given prior orthostatic hypotension and current AKI -Daily weights -Strict Is & Os  3. Chronic Afib: -Rate controlled -Metoprolol as above -Continue Coumadin per pharmacy -CHADS2VASc at least 6 (CHF, HTN, age x 2, vascular disease, female)  4. PNA: -Per IM  5. AKI: -As above -Monitor with diuresis  6. Elevated troponin/CAD: -Initial troponin noted to be 0.09, down trending -TTE as above -Metoprolol as above -On Coumadin in place of ASA  7. Hypomagnesemia: -Repleted  Signed, Christell Faith, PA-C [place] Pager: [phone] 04/13/2017, 8:08 AM   Attending Note Patient seen and examined, agree with detailed note above,  Patient presentation and plan discussed on rounds.    Sitting up in chair/recliner, off oxygen Feels weak, takes all of her energy to walk to the bathroom with assistance and walker Too tired to work with PT today Long discussion with family and friends at the bedside, Patient declining most types of food but does enjoy cream of chicken noodle soup Declining milkshakes, ice cream's, mashed potatoes, among many other items Still with significant cough, productive, white phlegm. Seems to choke her  On physical exam unable to estimate JVP, coarse  breath sounds Rales throughout bilaterally, heart sounds irregularly irregular, no murmurs appreciated, abdomen thin soft nondistended, no significant lower extremity edema  Lab work reviewed showing creatinine 1.66, BUN 32 (similar lab work July 30) INR 2.5  ------ Acute respiratory distress  resolving pneumonia, acute on chronic systolic and diastolic CHF,  Chest x-ray with pulmonary edema Would continue gentle Lasix IV with close monitoring of renal function Dose decreased down from 40 down to 20 mg given very small climbing BUN and creatinine Clinical exam concerning for persistent pulmonary edema Clinically appears to be improving compared to 48 hours ago  ----Acute renal failure possibly ATN, minimal change over the past several days but well above her baseline  proceed slowly with Lasix IV with close monitoring of renal function  --- Acute on chronic diastolic and systolic CHF  continue gentle IV Lasix for symptom relief, dose decreased down to 20 mg as above avoid ACE inhibitor, ARB given worsening renal failure  --- Chronic atrial fibrillation Rate well to be well controlled, tolerating anticoagulation On metoprolol for rate control  Dispo:  high risk of cardiac arrest, arrhythmia and death multiorgan system involvement including lung disease, cardiomyopathy, stage III renal failure, poor nutritional status, muscle wasting/atrophy, dysphagia, profound weakness Outlook is poor  Long discussion with patient's daughter concerning end-of-life care, need for full-time help at home after rehabilitation Greater than 50% was spent in counseling and coordination of care with patient Total encounter time 35 minutes or more   Signed: Esmond Plants  M.D., Ph.D. [facility]

## 2017-04-13 NOTE — Progress Notes (Signed)
ANTICOAGULATION CONSULT NOTE - Follow up [facility] for warfarin Indication: atrial fibrillation  Allergies  Allergen Reactions  . Sulfonamide Derivatives Nausea And Vomiting    REACTION: sick  . Alendronate Sodium Other (See Comments)    REACTION: GI   Patient Measurements: Height: 5\' 7"  (170.2 cm) Weight: 118 lb 14.4 oz (53.9 kg) IBW/kg (Calculated) : 61.6  Vital Signs: Temp: 98.7 F (37.1 C) (08/01 0542) Temp Source: Oral (07/31 2031) BP: 116/66 (08/01 0542) Pulse Rate: 81 (08/01 0542)  Labs:  Recent Labs  04/11/17 0446 04/11/17 1535 04/12/17 0418 04/13/17 0447  HGB  --   --  11.3*  --   HCT  --   --  34.1*  --   PLT  --   --  201  --   LABPROT 33.5*  --  28.7* 27.5*  INR 3.20  --  2.64 2.50  CREATININE 1.61*  --  1.52* 1.66*  TROPONINI  --  0.03*  --   --     Estimated Creatinine Clearance: 18 mL/min (A) (by C-G formula based on SCr of 1.66 mg/dL (H)).  Assessment: Pharmacy consulted to dose and monitor warfarin in this 83 year old female taking warfarin PTA for atrial fibrillation.   Home dose: warfarin 5 mg PO Tuesday, Friday, Sunday           Warfarin 7.5 mg PO Mon, Wed, Thurs, Sat Last dose PTA: 7/25 7/26  INR = 3.49 on admission 7/27 INR 4.31; held 7/28     INR= 3.22; 5 mg  7/29     INR= 3.31 held 7/30     INR =3.20 held 7/31     INR=2.64 5mg  8/1       INR 2.50  Goal of Therapy:  INR 2-3  Plan:  INR is therapeutic. Continue new dose of 5mg  daily. Follow up INR tomorrow AM. CBC at least q 3 days  Pharmacy will continue to follow.  Ramond Dial, Pharm.D, BCPS Clinical Pharmacist  04/13/2017,7:39 AM

## 2017-04-13 NOTE — Progress Notes (Signed)
Initial Nutrition Assessment  DOCUMENTATION CODES:   Severe malnutrition in context of chronic illness  INTERVENTION:  Noted patient is ordered for dysphagia 3 diet with nectar-thick liquids (per SLP recommendation), yet there is still a comment for 2 gram sodium diet. In setting of severe malnutrition, recommend liberalizing 2 gram sodium restriction.  Noted patient is ordered for calcium carbonate 1000 mg QHS. Only able to absorb approximately 500 mg of calcium at a time. Recommend splitting dose into 500 mg BID.  Continue daily multivitamin with minerals.  Encouraged intake of small, frequent meals (6 per day). RD will order snacks TID between meals.  Continue Magic cup TID with meals, each supplement provides 290 kcal and 9 grams of protein. Patient prefers vanilla.  Continue Mighty Shake II TID with meals, each supplement provides 480-500 kcals and 20-23 grams of protein. Patient prefers vanilla.  Reviewed "High-Calorie, High-Protein Nutrition Therapy" and "High-Calorie, High Protein Recipes" handouts from the Academy of [employer] and Dietetics with patient's daughter.  NUTRITION DIAGNOSIS:   Malnutrition (Severe) related to chronic illness (COPD, CHF) as evidenced by 12.4 percent weight loss over 6 months, severe depletion of body fat, severe depletion of muscle mass.  GOAL:   Patient will meet greater than or equal to 90% of their needs  MONITOR:   PO intake, Supplement acceptance, Labs, Weight trends, Skin, I & O's  REASON FOR ASSESSMENT:   Consult (Verbal Consult) Poor PO  ASSESSMENT:   83 year old female with PMHx of A-fib, CAD, hypothyroidism, GERD, diverticulosis, osteoporosis, osteoarthritis, dyslipidemia, ischemic cardiomyopathy, pulmonary hypertension, COPD, hx of MI 4174, chronic systolic CHF admitted with acute respiratory failure with hypoxia in setting of PNA and acute on CHF.   Spoke with patient and her daughter at bedside. Patient not able to provide  much history. Daughter reports patient has been living at [facility] independent living. She has a sitter in the morning to help her get ready and who also goes to get her breakfast tray. She goes to the cafeteria to eat her lunch and dinner. Daughter reports patient is not eating well at meals, but unable to describe exactly how much she is finishing of trays. She reports she has had a poor appetite for a few years now. She has not been on a dysphagia diet with thickened liquids before. She has some issue with her vocal cords where they were not letting her drink from a straw, but that was only known swallowing difficulty prior to this admission.  UBW 118-119 lbs lately per daughter. Per chart noted that patient was 135.8 lbs on 10/10/2016 and has lost 16.9 lbs (12.4% body weight) over the past 6 months, which is significant for time frame.  Meal Completion: variable - 20-90% per chart  Medications reviewed and include: calcium carbonate 1000 mg QHS, vitamin B12 1000 micrograms every 30 days IM, Colace, Lasix 20 mg BID, levothyroxine, Remeron 15 mg QHS, MVI daily, Ocuvite-Lutein daily, nystatin, pantoprazole, warfarin, cefepime.  Labs reviewed: Sodium 147, CO2 33, BUN 32, Creatinine 1.66.   Nutrition-Focused physical exam completed. Findings are severe fat depletion, severe muscle depletion, and no edema noted on upper or lower extremities.   Discussed with SLP.  Diet Order:  DIET DYS 3 Room service appropriate? Yes with Assist; Fluid consistency: Nectar Thick  Skin:  Wound (see comment) (Stg I to sacrum)  Last BM:  04/13/2017 - type 4  Height:   Ht Readings from Last 1 Encounters:  04/07/17 5\' 7"  (1.702 m)  Weight:   Wt Readings from Last 1 Encounters:  04/13/17 118 lb 14.4 oz (53.9 kg)    Ideal Body Weight:  61.4 kg  BMI:  Body mass index is 18.62 kg/m.  Estimated Nutritional Needs:   Kcal:  6269-4854 (30-35 kcal/kg)  Protein:  80-92 grams (1.2-1.4 grams/kg)  Fluid:  1.3  L/day (25 ml/kg)  EDUCATION NEEDS:   Education needs addressed  Willey Blade, MS, RD, LDN Pager: [phone] After Hours Pager: [phone]

## 2017-04-13 NOTE — Progress Notes (Signed)
PT Cancellation Note  Patient Details Name: [name] MRN: [id number] DOB: Jan 24, 1922   Cancelled Treatment:    Reason Eval/Treat Not Completed: Fatigue/lethargy limiting ability to participate;Other (comment). Initial attempt, pt with MD. Second attempt, pt/family notes up in chair post walk to/from bathroom. Pt fatigued at this time and wishes to postpone PT to a later time/date. Re attempt as the schedule allows.    Larae Grooms, PTA 04/13/2017, 12:20 PM

## 2017-04-13 NOTE — Progress Notes (Signed)
Advanced Care Plan.  Purpose of Encounter: Palliative care consult. Parties in Attendance: The patient, her daughter and me. Patient's Decisional Capacity: yes. Medical Story: [name] is an 83 y.o. female.   she has multiple medical problems. She is admitted for acute respiratory failure due to pneumonia and CHF. She has been treated with antibiotics and IV Lasix. She developed acute renal failure. She has very poor prognosis. I discussed with the patient and her daughter about palliative care consult. Her daughter voiced understanding and agreed to get palliative care consult.  Plan:  Code Status:  Poor prognosis, palliative care consult. Time spent discussing advance care planning: 17 minutes.

## 2017-04-13 NOTE — Clinical Social Work Placement (Signed)
   CLINICAL SOCIAL WORK PLACEMENT  NOTE  Date:  04/13/2017  Patient Details  Name: KEELIN NEVILLE MRN: [id number] Date of Birth: 05/28/1922  Clinical Social Work is seeking post-discharge placement for this patient at the [place] level of care (*CSW will initial, date and re-position this form in  chart as items are completed):  Yes   Patient/family provided with [place] Work Department's list of facilities offering this level of care within the geographic area requested by the patient (or if unable, by the patient's family).  Yes   Patient/family informed of their freedom to choose among providers that offer the needed level of care, that participate in Medicare, Medicaid or managed care program needed by the patient, have an available bed and are willing to accept the patient.  Yes   Patient/family informed of [place]'s ownership interest in [facility] and [facility], as well as of the fact that they are under no obligation to receive care at these facilities.  PASRR submitted to EDS on       PASRR number received on       Existing PASRR number confirmed on 04/12/17     FL2 transmitted to all facilities in geographic area requested by pt/family on 04/12/17     FL2 transmitted to all facilities within larger geographic area on       Patient informed that his/her managed care company has contracts with or will negotiate with certain facilities, including the following:        Yes   Patient/family informed of bed offers received.  Patient chooses bed at [facility]     Physician recommends and patient chooses bed at      Patient to be transferred to   on  .  Patient to be transferred to facility by       Patient family notified on   of transfer.  Name of family member notified:        PHYSICIAN       Additional Comment:    _______________________________________________ Darden Dates, LCSW 04/13/2017, 5:00 PM

## 2017-04-13 NOTE — Progress Notes (Signed)
Attending physician Bridgett Larsson) discussed palliative care consult with patient and patients daughter. Consult is in place.   Patients daughter may not be at bedside all day tomorrow. Daughter is asking that if she is not at bedside with her mother when palliative consults that she be called.   Madlyn Frankel, RN

## 2017-04-14 DIAGNOSIS — Z515 Encounter for palliative care: Secondary | ICD-10-CM

## 2017-04-14 DIAGNOSIS — Z7189 Other specified counseling: Secondary | ICD-10-CM

## 2017-04-14 LAB — PROTIME-INR
INR: 2.57
PROTHROMBIN TIME: 28.1 s — AB (ref 11.4–15.2)

## 2017-04-14 LAB — BASIC METABOLIC PANEL
Anion gap: 10 (ref 5–15)
BUN: 45 mg/dL — AB (ref 6–20)
CALCIUM: 9 mg/dL (ref 8.9–10.3)
CHLORIDE: 103 mmol/L (ref 101–111)
CO2: 34 mmol/L — ABNORMAL HIGH (ref 22–32)
CREATININE: 1.82 mg/dL — AB (ref 0.44–1.00)
GFR calc non Af Amer: 23 mL/min — ABNORMAL LOW (ref >60)
GFR, EST AFRICAN AMERICAN: 26 mL/min — AB (ref >60)
Glucose, Bld: 108 mg/dL — ABNORMAL HIGH (ref 65–99)
Potassium: 4.1 mmol/L (ref 3.5–5.1)
SODIUM: 147 mmol/L — AB (ref 135–145)

## 2017-04-14 NOTE — Progress Notes (Signed)
Patient Name: [name] Date of Encounter: 04/14/2017  Primary Cardiologist: [facility] Problem List     Principal Problem:   Acute respiratory distress Active Problems:   CAD in native artery   Acute on chronic combined systolic (congestive) and diastolic (congestive) heart failure (HCC)   Chronic atrial fibrillation (HCC)   Pneumonia   Pressure injury of skin   AKI (acute kidney injury) ([place])   Hypomagnesemia     Subjective   Patient seen on rounds this evening, reports that she is feeling somewhat better Breathing has improved, eating more Walked past the door of her hospital room, feeling encouraged Daughter not at the bedside  Inpatient Medications    Scheduled Meds: . calcium carbonate  1,000 mg Oral QPM  . cyanocobalamin  1,000 mcg Intramuscular Q30 days  . docusate sodium  100 mg Oral Daily  . fluticasone  1 spray Each Nare Daily  . levothyroxine  88 mcg Oral Daily  . metoprolol tartrate  12.5 mg Oral BID  . midodrine  5 mg Oral BID WC  . mirtazapine  15 mg Oral QHS  . multivitamin with minerals  1 tablet Oral Daily  . multivitamin-lutein  1 capsule Oral Daily  . nystatin  5 mL Oral QID  . pantoprazole  40 mg Oral Daily  . polyvinyl alcohol  1 drop Both Eyes Daily  . simvastatin  10 mg Oral QPM  . warfarin  5 mg Oral q1800  . Warfarin - Pharmacist Dosing Inpatient   Does not apply q1800   Continuous Infusions: . sodium chloride     PRN Meds: acetaminophen **OR** acetaminophen, guaiFENesin-dextromethorphan, ipratropium-albuterol, polyvinyl alcohol   Vital Signs    Vitals:   04/14/17 0451 04/14/17 0837 04/14/17 1515 04/14/17 1534  BP: 118/67 (!) 109/59 (!) 108/58   Pulse: 88 90 75 78  Resp: 18 16 18    Temp: 97.7 F (36.5 C)  97.6 F (36.4 C)   TempSrc: Oral  Oral   SpO2: 93% 96% 95% 92%  Weight: 118 lb 8 oz (53.8 kg)     Height:        Intake/Output Summary (Last 24 hours) at 04/14/17 1837 Last data filed at 04/14/17 1821  Gross per 24 hour  Intake               50 ml  Output              700 ml  Net             -650 ml   Filed Weights   04/13/17 0625 04/14/17 0451  Weight: 118 lb 14.4 oz (53.9 kg) 118 lb 8 oz (53.8 kg)    Physical Exam    GEN: Frail and elderly appearing, in no acute distress. Supine in bed HEENT: Grossly normal.  Neck: Supple, no significant JVD, no carotid bruits, or masses. Cardiac: Irregularly irregular, II/VI systolic murmur, no rubs, or gallops. No clubbing, cyanosis, edema.  Radials/DP/PT 2+ and equal bilaterally.  Respiratory:  Diminished breath sounds bilaterally. GI: Soft, nontender, nondistended, BS + x 4. MS: no deformity or atrophy. Skin: warm and dry, no rash. Neuro:  Strength and sensation are intact. Psych: AAOx3.  Normal affect.  Labs    CBC  Recent Labs  04/12/17 0418  WBC 9.0  HGB 11.3*  HCT 34.1*  MCV 93.8  PLT 545   Basic Metabolic Panel  Recent Labs  04/13/17 0447 04/13/17 1517 04/14/17 0336  NA 147*  --  147*  K 5.1 4.7 4.1  CL 105  --  103  CO2 33*  --  34*  GLUCOSE 106*  --  108*  BUN 32*  --  45*  CREATININE 1.66* 1.88* 1.82*  CALCIUM 9.4  --  9.0  MG 1.9  --   --    Liver Function Tests No results for input(s): AST, ALT, ALKPHOS, BILITOT, PROT, ALBUMIN in the last 72 hours. No results for input(s): LIPASE, AMYLASE in the last 72 hours. Cardiac Enzymes No results for input(s): CKTOTAL, CKMB, CKMBINDEX, TROPONINI in the last 72 hours. BNP Invalid input(s): POCBNP D-Dimer No results for input(s): DDIMER in the last 72 hours. Hemoglobin A1C No results for input(s): HGBA1C in the last 72 hours. Fasting Lipid Panel No results for input(s): CHOL, HDL, LDLCALC, TRIG, CHOLHDL, LDLDIRECT in the last 72 hours. Thyroid Function Tests No results for input(s): TSH, T4TOTAL, T3FREE, THYROIDAB in the last 72 hours.  Invalid input(s): FREET3  Telemetry     Afib - Personally Reviewed  ECG    n/a - Personally Reviewed  Radiology     Dg Chest Port 1 View  Result Date: 04/11/2017 IMPRESSION: Stable cardiomegaly with bilateral pulmonary edema and pleural effusions concerning for congestive heart failure. Aortic atherosclerosis. Electronically Signed   By: Marijo Conception, M.D.   On: 04/11/2017 12:32    Cardiac Studies   TTE 04/11/2017: Study Conclusions  - Left ventricle: The cavity size was normal. Systolic function was mildly to moderately reduced. The estimated ejection fraction was in the range of 40% to 45%. Hypokinesis of the anteroseptal myocardium. Hypokinesis of the anterior myocardium. The study is not technically sufficient to allow evaluation of LV diastolic function. - Mitral valve: There was moderate to severe regurgitation. - Left atrium: The atrium was moderately dilated. - Right ventricle: The cavity size was mildly dilated. Wall thickness was normal. Systolic function was normal. - Right atrium: The atrium was moderately dilated. - Tricuspid valve: There was severe regurgitation. - Pulmonary arteries: Systolic pressure was severely elevated. PA peak pressure: 66 mm Hg (S).  Impressions:  - IVC measured 2 to 3 cm, severely dilated.  Patient Profile     83 y.o. female with history of CAD s/p remote PCI x 2 to the LAD in 2004, chronic Afib on Coumadin, chronic combined CHF, pulmonary hypertension, mitral regurgitation, prolonged QT, LBBB, orthostatic hypotension, carotid artery disease, HTN, thrombocytopenia, and chronic back pain who was admitted on 7/29 with cough and found to have acute respiratory distress 2/2 possible PNA and volume overload.   Assessment & Plan      ------ Acute respiratory distress  resolving pneumonia, acute on chronic systolic and diastolic CHF, -Lasix put on hold given climbing creatinine at 1.8 Improved respiratory status  ----Acute renal failure As above, creatinine has climbed, BUN climb, Lasix on hold  --- Acute on chronic diastolic and  systolic CHF avoid ACE inhibitor, ARB given worsening renal failure Lasix on hold  --- Chronic atrial fibrillation Rate well to be well controlled, tolerating anticoagulation On metoprolol for rate control   Very weak, nutrition major issue Greater than 50% was spent in counseling and coordination of care with patient Total encounter time 25 minutes or more   Signed: Esmond Plants  M.D., Ph.D. [facility]

## 2017-04-14 NOTE — Consult Note (Signed)
Consultation Note Date: 04/14/2017   Patient Name: [name]  DOB: 10/04/1921  MRN: [id number]  Age / Sex: 83 y.o., female  PCP: Tower, Wynelle Fanny, MD Referring Physician: Demetrios Loll, MD  Reason for Consultation: Establishing goals of care  HPI/Patient Profile: 82 y.o. female  with past medical history of anemia, osteoarthritis, CAD, AF, sCHF (EF 30-35%), COPD, diverticulosis, HTN, MI, hypothyroidism admitted on 04/07/2017 from [facility] with productive cough and chest tightness. Has had continued issues with SOB r/t pneumonia and more likely CHF exacerbation as well as acute renal failure. Palliative requested to to assist with GOC.   Clinical Assessment and Goals of Care: I met today with [name] and her daughter, Despina Pole. [name] is very tired and appears even distressed holding her head. She says that she just wishes to sleep and agrees that I can speak with her daughter.  I spoke more with [name] who is struggling with her mother's decline. [name] understands that her mother is 90 yo and is very ill. However, she struggles with recognizing what is possible and what is realistic at times. We discussed how her mother has not really improved much over her hospital stay and she continues to be extremely fatigued and weak. We discussed short term rehab (with palliative care) vs home with hospice, rehospitalization, expectations. [name] certainly wants to optimize her mother's health and attempt rehab (we later confirmed with [name] that she desires and is up to trying rehab). I explained to [name] that our physical body has limits and that her mother "is tired" and that this may not improve and this may be the best it gets. We discussed that she may not improve with rehab and that [name] may get tired and not feel or want to continue rehab and discussed transition to  hospice to focus on comfort care. Also not sure how long her cardiac and fluid can balance with her renal function before becoming an issue for her again.   Went back to the room and [name] began to get anxious when discussing potential discharge with Dr. Bridgett Larsson. I reminded her that when her fluid and kidneys are optimized there is not much more that can be offered at the hospital as she will not get stronger her. Encouraged that rehab is the best chance to work on weakness IF she is too improve.   Primary Decision Maker PATIENT and daughter, [name]    SUMMARY OF RECOMMENDATIONS   - DNR - Hopeful for SNF rehab with palliative follow up - Potential need to transition to hospice from SNF (discussed with daughter)  Code Status/Advance Care Planning:  DNR   Symptom Management:   SOB: Per cardiology.   Weakness: Continue PT/OT.   Poor intake: Liberalize diet to safety. Discussed with daughter options she would enjoy that are safer for her to eat.   Palliative Prophylaxis:   Aspiration, Frequent Pain Assessment, Oral Care and Turn Reposition  Additional Recommendations (Limitations, Scope, Preferences):  Avoid Hospitalization and No Artificial Feeding  Psycho-social/Spiritual:   Desire for further Chaplaincy support:yes  Additional Recommendations: Caregiving  Support/Resources, Education on Hospice and Funeral Planning/Counseling  Prognosis:   < 6 months is likely with declining status  Discharge Planning: [place] for rehab with Palliative care service follow-up      Primary Diagnoses: Present on Admission: . Pneumonia . Acute on chronic combined systolic (congestive) and diastolic (congestive) heart failure ([place]) . Chronic atrial fibrillation ([place]) . CAD in native artery   I have reviewed the medical record, interviewed the patient and family, and examined the patient. The following aspects are pertinent.  Past Medical History:  Diagnosis Date    . Allergy   . Anemia   . Arthritis    osteo  . CAD (coronary artery disease)    a. 2 DES, [facility], 2004; b. nuc 2013 septal dyssenergy 2/2 LBBB, nl LV fxn;  c. 06/2015 MV: EF 41%, septal HK (LBBB), no ischemia->Low risk.  . Chronic atrial fibrillation (HCC)    a. on Coumadin (CHA2DS2VASc = 6); b. Holter 06/2010, no brady, mild tachy.  . Chronic systolic CHF (congestive heart failure) ([place])    a. 03/2015 Echo: EF 30-35%;  b. 01/2016 Echo: EF 30-35%.  . Compression fracture of lumbar vertebra ([place]) 05-2008  . COPD (chronic obstructive pulmonary disease) ([place])   . Diverticulosis of colon   . Dyslipidemia   . Essential hypertension   . Family history of adverse reaction to anesthesia    daughter PONV  . GERD (gastroesophageal reflux disease)   . Hard of hearing   . Hematemesis    a. 03/2015.  [name] Hx of colonoscopy   . Hypothyroid   . Ischemic cardiomyopathy    a. 03/2015 Echo: EF 30-35%, ant, antsept HK, mod MR, mildly dil LA/RV, mod dil RA, mod-sev TR, PASP 51mHg;  b. 01/2016 Echo: EF 30-35%, ant/antsept HK, mildly dil Ao root (3.4cm) and Asc Ao (3.5cm), mild [email or web address]-atrial enlargement, mild to mod TR. PASP 355mg.  . Marland KitchenBBB (left bundle branch block)   . Lung abnormality    Dr ClGwenette Greet22/2011 no further w/u needed  . Mitral regurgitation   . [facility]   "mild heart attack"  . Nausea    Some nausea with medications, May, 2013,  . Nocturia   . OA (osteoarthritis)   . Orthostatic hypotension    June, 2014  . Osteoporosis   . Pleural effusion associated with pulmonary infection 03-2007  . Prolonged QT interval    when on sotolol in past  . Pulmonary hypertension ([place]   a. 03/2015 PASP 5788m by echo.  . Shingles 2014  . Thrombocytopenia ([facility] . TR (tricuspid regurgitation)    moderate, echo 2008  . Urinary frequency   . Vitamin B deficiency   . Warfarin anticoagulation   . Wears dentures   . Wears glasses   . Weight loss    August, 2012   Social  History   Social History  . Marital status: Widowed    Spouse name: N/A  . Number of children: N/A  . Years of education: N/A   Social History Main Topics  . Smoking status: Former Smoker    Types: Cigarettes    Quit date: 09/14/1947  . Smokeless tobacco: Never Used  . Alcohol use No  . Drug use: No  . Sexual activity: Not Currently   Other Topics Concern  . None   Social History Narrative   Very independent    Lives  in [place] by herself          Family History  Problem Relation Age of Onset  . Heart disease Mother 68  . Stroke Father 29  . Heart disease Sister   . Cancer Brother        colon  . Diabetes Brother   . Leukemia Sister    Scheduled Meds: . calcium carbonate  1,000 mg Oral QPM  . cyanocobalamin  1,000 mcg Intramuscular Q30 days  . docusate sodium  100 mg Oral Daily  . fluticasone  1 spray Each Nare Daily  . furosemide  20 mg Intravenous BID  . levothyroxine  88 mcg Oral Daily  . metoprolol tartrate  12.5 mg Oral BID  . midodrine  5 mg Oral BID WC  . mirtazapine  15 mg Oral QHS  . multivitamin with minerals  1 tablet Oral Daily  . multivitamin-lutein  1 capsule Oral Daily  . nystatin  5 mL Oral QID  . pantoprazole  40 mg Oral Daily  . polyvinyl alcohol  1 drop Both Eyes Daily  . simvastatin  10 mg Oral QPM  . warfarin  5 mg Oral q1800  . Warfarin - Pharmacist Dosing Inpatient   Does not apply q1800   Continuous Infusions: . ceFEPime (MAXIPIME) IV Stopped (04/13/17 1920)  . sodium chloride     PRN Meds:.acetaminophen **OR** acetaminophen, guaiFENesin-dextromethorphan, ipratropium-albuterol, polyvinyl alcohol Allergies  Allergen Reactions  . Sulfonamide Derivatives Nausea And Vomiting    REACTION: sick  . Alendronate Sodium Other (See Comments)    REACTION: GI   Review of Systems  Constitutional: Positive for activity change, appetite change, fatigue and unexpected weight change.  Respiratory: Positive for cough and shortness of breath.     Neurological: Positive for weakness.    Physical Exam  Constitutional: She is oriented to person, place, and time. She appears well-developed. She appears cachectic. She has a sickly appearance.  Cardiovascular: Normal rate.  An irregularly irregular rhythm present.  Pulmonary/Chest: No accessory muscle usage. No tachypnea. She is in respiratory distress. She has rales.  Abdominal: Soft. Normal appearance.  Neurological: She is alert and oriented to person, place, and time.  Vitals reviewed.   Vital Signs: BP (!) 109/59 (BP Location: Right Arm)   Pulse 90   Temp 97.7 F (36.5 C) (Oral)   Resp 16   Ht _0  (1.702 m)   Wt 53.8 kg (118 lb 8 oz)   SpO2 96%   BMI 18.56 kg/m  Pain Assessment: 0-10 POSS *See Group Information*: 1-Acceptable,Awake and alert Pain Score: 3    SpO2: SpO2: 96 % O2 Device:SpO2: 96 % O2 Flow Rate: .O2 Flow Rate (L/min): 1 L/min  IO: Intake/output summary:  Intake/Output Summary (Last 24 hours) at 04/14/17 1223 Last data filed at 04/14/17 1015  Gross per 24 hour  Intake              290 ml  Output              500 ml  Net             -210 ml    LBM: Last BM Date: 04/13/17 Baseline Weight: Weight: 54.9 kg (121 lb) Most recent weight: Weight: 53.8 kg (118 lb 8 oz)     Palliative Assessment/Data: 30%     Time Total: 69mn  Greater than 50%  of this time was spent counseling and coordinating care related to the above assessment and plan.  Signed by: AElmo Putt  Jerline Pain, NP Palliative Medicine Team Pager # [phone] (M-F 8a-5p) Team Phone # [phone] (Nights/Weekends)

## 2017-04-14 NOTE — Progress Notes (Signed)
[place] [facility] [place] NAME: [name]    MR#:  [id number]  DATE OF BIRTH:  May 02, 1922  SUBJECTIVE:   Better shortness of breath and cough, but has generalized weakness.  REVIEW OF SYSTEMS:    Review of Systems  Constitutional: Positive for malaise/fatigue. Negative for chills and fever.  HENT: Negative for congestion and tinnitus.   Eyes: Negative for blurred vision and double vision.  Respiratory: Positive for cough and shortness of breath. Negative for sputum production and wheezing.   Cardiovascular: Negative for chest pain, orthopnea and PND.  Gastrointestinal: Negative for abdominal pain, diarrhea, nausea and vomiting.  Genitourinary: Negative for dysuria and hematuria.  Neurological: Positive for weakness. Negative for dizziness, sensory change and focal weakness.  All other systems reviewed and are negative.   Nutrition: Dysphagia II, nectar thick liquids.  Tolerating Diet: Yes Tolerating PT: Eval noted.   DRUG ALLERGIES:   Allergies  Allergen Reactions  . Sulfonamide Derivatives Nausea And Vomiting    REACTION: sick  . Alendronate Sodium Other (See Comments)    REACTION: GI    VITALS:  Blood pressure (!) 109/59, pulse 90, temperature 97.7 F (36.5 C), temperature source Oral, resp. rate 16, height 5\' 7"  (1.702 m), weight 118 lb 8 oz (53.8 kg), SpO2 96 %.  PHYSICAL EXAMINATION:   Physical Exam  GENERAL:  83 y.o.-year-old patient sitting up in chair in mild resp. Distress.  EYES: Pupils equal, round, reactive to light and accommodation. No scleral icterus. Extraocular muscles intact.  HEENT: Head atraumatic, normocephalic. Oropharynx and nasopharynx clear.  NECK:  Supple, no jugular venous distention. No thyroid enlargement, no tenderness.  LUNGS: Good A/E b/l, no wheezing, mild bilateral basilar rales. No use of accessory muscles of respiration.  CARDIOVASCULAR: S1, S2 normal. No murmurs, rubs, or gallops.   ABDOMEN: Soft, nontender, nondistended. Bowel sounds present. No organomegaly or mass.  EXTREMITIES: No cyanosis, clubbing or edema b/l.    NEUROLOGIC: Cranial nerves II through XII are intact. No focal Motor or sensory deficits b/l.  Globally weak 2-3/5.  PSYCHIATRIC: The patient is alert and oriented x 3.  SKIN: No obvious rash, lesion, or ulcer. But has bruises on legs.   LABORATORY PANEL:   CBC  Recent Labs Lab 04/12/17 0418  WBC 9.0  HGB 11.3*  HCT 34.1*  PLT 201   ------------------------------------------------------------------------------------------------------------------  Chemistries   Recent Labs Lab 04/09/17 0503  04/13/17 0447  04/14/17 0336  NA 137  < > 147*  --  147*  K 4.0  < > 5.1  < > 4.1  CL 110  < > 105  --  103  CO2 22  < > 33*  --  34*  GLUCOSE 98  < > 106*  --  108*  BUN 39*  < > 32*  --  45*  CREATININE 1.54*  < > 1.66*  < > 1.82*  CALCIUM 7.8*  < > 9.4  --  9.0  MG  --   < > 1.9  --   --   AST 34  --   --   --   --   ALT 19  --   --   --   --   ALKPHOS 71  --   --   --   --   BILITOT 0.9  --   --   --   --   < > = values in this interval not displayed. ------------------------------------------------------------------------------------------------------------------  Cardiac Enzymes  Recent Labs Lab 04/11/17 1535  TROPONINI 0.03*   ------------------------------------------------------------------------------------------------------------------  RADIOLOGY:  No results found.   ASSESSMENT AND PLAN:   83 year old female with past medical history of hypertension, COPD, osteoarthritis, chronic atrial fibrillation, history of coronary artery disease, hyperlipidemia, GERD, previous history of MI who presented to the hospital due to shortness of breath, cough.  1. Acute respiratory failure with hypoxia-suspected be secondary to pneumonia and also CHF.  -Continue IV Cefepime, incentive spirometry and antitussives.  -Continue diuresis  with IV Lasix, follow I's and O's and daily weights. Weaned off oxygen. Clinically improved.  2. Pneumonia-continue IV cefepime, follow blood, sputum cultures which are (-) so far.  -Continue antitussives and supportive care. Improving.  3. CHF-acute on chronic systolic dysfunction.  -Diuresing well with IV Lasix and as per cardiology  Decreased IV Lasix to 20 mg twice a day due to worsening renal function. Continue metoprolol, hold ACE due to AKI and previous Orthostatic Hypotension.   4. Acute kidney injury- possibly ATN. Due to lasix and CHF.  Worsening. Decreased IV Lasix dose. Renal dose meds and avoid nephrotoxins.  Follow-up BMP.  5. Hypothyroidism-continue Synthroid.  6. Essential hypertension-continue metoprolol.  7. Hyperlipidemia-continue simvastatin.  8. GERD-continue Protonix.  9. History of chronic atrial fibrillation-rate controlled. Continue metoprolol. Continue Coumadin, INR therapeutic presently.  10. Hypokalemia/Hypomagnesemia - cont. To replace and will repeat in a.m.    * Severe malnutrition in context of chronic illness. Follow-up dietitian recommendation.  Poor prognosis. Discussed with palliative care staff Seen by PT and they recommend SNF/STR now and Social Work aware.  All the records are reviewed and case discussed with Care Management/Social Worker. Management plans discussed with the patient, her daughter and they are in agreement.  CODE STATUS: DNR  DVT Prophylaxis: Warfarin  TOTAL TIME TAKING CARE OF THIS PATIENT: 36 minutes.   POSSIBLE D/C IN 1-2 DAYS, DEPENDING ON CLINICAL CONDITION.   Demetrios Loll M.D on 04/14/2017 at 2:59 PM  Between 7am to 6pm - Pager - [phone]  After 6pm go to [email or web address] - Technical brewer [facility]  Office  [phone]  CC: Primary care physician; Tower, Wynelle Fanny, MD

## 2017-04-14 NOTE — Progress Notes (Signed)
Physical Therapy Treatment Patient Details Name: [name] MRN: [id number] DOB: 1922-01-15 Today's Date: 04/14/2017    History of Present Illness Pt is a 83 y.o. female presenting to hospital with productive cough and fever.  Pt admitted with PNA and AKI.  PMH includes OA, CAD, chronic a-fib, systolic CHF, compression fx L-spine, COPD, LBBB, MI, orthostatic hypotension.    PT Comments    Pt agreeable to PT; denies pain. Participates in supine bed exercises initially with demonstration of good range/strength; does not require any physical assist and tolerates resistance for extension portion of heel slides. Mod I for bed mobility. Min guard for safety for transfers and ambulation. Progressing distance of ambulation as well. Continue PT to progress endurance and strength to improve quality of functional mobility.    Follow Up Recommendations  SNF     Equipment Recommendations  Rolling walker with 5" wheels    Recommendations for Other Services       Precautions / Restrictions Precautions Precautions: Fall Restrictions Weight Bearing Restrictions: No    Mobility  Bed Mobility Overal bed mobility: Modified Independent             General bed mobility comments: use of rails; no physical assist. Repositions self in bed aw well  Transfers Overall transfer level: Needs assistance Equipment used: Rolling walker (2 wheeled) Transfers: Sit to/from Stand Sit to Stand: Min guard         General transfer comment: safe use of hands; min guard for safety  Ambulation/Gait Ambulation/Gait assistance: Min guard Ambulation Distance (Feet): 90 Feet Assistive device: Rolling walker (2 wheeled) Gait Pattern/deviations: Step-through pattern;Trunk flexed;Decreased stride length   Gait velocity interpretation: at or above normal speed for age/gender General Gait Details: No LOB   Stairs            Wheelchair Mobility    Modified Rankin (Stroke Patients Only)       Balance                                            Cognition Arousal/Alertness: Awake/alert Behavior During Therapy: WFL for tasks assessed/performed Overall Cognitive Status: Within Functional Limits for tasks assessed                                        Exercises General Exercises - Lower Extremity Ankle Circles/Pumps: AROM;Both;15 reps;Supine Quad Sets: Strengthening;Both;15 reps;Supine Gluteal Sets: Strengthening;Both;15 reps;Supine Short Arc Quad: AROM;Both;15 reps;Supine Heel Slides: AROM;Strengthening;Both;15 reps;Supine (resisted extension) Hip ABduction/ADduction: AROM;Strengthening;Both;15 reps;Supine (holds leg off bed independently) Straight Leg Raises: Strengthening;Both;15 reps;Supine    General Comments        Pertinent Vitals/Pain Pain Assessment: No/denies pain    Home Living                      Prior Function            PT Goals (current goals can now be found in the care plan section) Progress towards PT goals: Progressing toward goals    Frequency    Min 2X/week      PT Plan Current plan remains appropriate    Co-evaluation              AM-PAC PT "6 Clicks" Daily Activity  Outcome Measure  Difficulty turning  over in bed (including adjusting bedclothes, sheets and blankets)?: A Little Difficulty moving from lying on back to sitting on the side of the bed? : A Little Difficulty sitting down on and standing up from a chair with arms (e.g., wheelchair, bedside commode, etc,.)?: Total Help needed moving to and from a bed to chair (including a wheelchair)?: A Little Help needed walking in hospital room?: A Little Help needed climbing 3-5 steps with a railing? : A Lot 6 Click Score: 15    End of Session Equipment Utilized During Treatment: Gait belt Activity Tolerance: Patient tolerated treatment well Patient left: in bed;with call bell/phone within reach;with bed alarm set;with  family/visitor present   PT Visit Diagnosis: Muscle weakness (generalized) (M62.81);Difficulty in walking, not elsewhere classified (R26.2)     Time: 2947-6546 PT Time Calculation (min) (ACUTE ONLY): 33 min  Charges:  $Gait Training: 8-22 mins $Therapeutic Exercise: 8-22 mins                    G Codes:        Larae Grooms, PTA 04/14/2017, 4:32 PM

## 2017-04-14 NOTE — Progress Notes (Signed)
PT Cancellation Note  Patient Details Name: [name] MRN: [id number] DOB: 04-Sep-1922   Cancelled Treatment:    Reason Eval/Treat Not Completed: Other (comment). Treatment attempted this morning. Pt with MD and family in discussion. Re attempt at a later time.    Larae Grooms, PTA 04/14/2017, 2:14 PM

## 2017-04-14 NOTE — Progress Notes (Signed)
New referral for Palliative to follow at [facility] received from [place]. Hospital Palliative consult pending. No discharge date at this time. Referral made aware. Thank you Flo Shanks RN, BSN, [facility], hospital Liaison [phone] c

## 2017-04-14 NOTE — Progress Notes (Signed)
ANTICOAGULATION CONSULT NOTE - Follow up [place] for warfarin Indication: atrial fibrillation  Allergies  Allergen Reactions  . Sulfonamide Derivatives Nausea And Vomiting    REACTION: sick  . Alendronate Sodium Other (See Comments)    REACTION: GI   Patient Measurements: Height: 5\' 7"  (170.2 cm) Weight: 118 lb 8 oz (53.8 kg) IBW/kg (Calculated) : 61.6  Vital Signs: Temp: 97.7 F (36.5 C) (08/02 0451) Temp Source: Oral (08/02 0451) BP: 109/59 (08/02 0837) Pulse Rate: 90 (08/02 0837)  Labs:  Recent Labs  04/11/17 1535  04/12/17 0418 04/13/17 0447 04/13/17 1517 04/14/17 0336  HGB  --   --  11.3*  --   --   --   HCT  --   --  34.1*  --   --   --   PLT  --   --  201  --   --   --   LABPROT  --   --  28.7* 27.5*  --  28.1*  INR  --   --  2.64 2.50  --  2.57  CREATININE  --   < > 1.52* 1.66* 1.88* 1.82*  TROPONINI 0.03*  --   --   --   --   --   < > = values in this interval not displayed.  Estimated Creatinine Clearance: 16.4 mL/min (A) (by C-G formula based on SCr of 1.82 mg/dL (H)).  Assessment: Pharmacy consulted to dose and monitor warfarin in this 83 year old female taking warfarin PTA for atrial fibrillation.   Home dose: warfarin 5 mg PO Tuesday, Friday, Sunday           Warfarin 7.5 mg PO Mon, Wed, Thurs, Sat Last dose PTA: 7/25 7/26  INR = 3.49 on admission 7/27 INR 4.31; held 7/28     INR= 3.22; 5 mg  7/29     INR= 3.31 held 7/30     INR =3.20 held 7/31     INR=2.64 5mg  8/1       INR 2.50 8/2       INR 2.57  Goal of Therapy:  INR 2-3  Plan:  INR is therapeutic. Continue new dose of 5mg  daily. Follow up INR tomorrow AM. CBC at least q 3 days  Pharmacy will continue to follow.  Ramond Dial, Pharm.D, BCPS Clinical Pharmacist  04/14/2017,11:25 AM

## 2017-04-15 ENCOUNTER — Telehealth: Payer: Self-pay

## 2017-04-15 DIAGNOSIS — R278 Other lack of coordination: Secondary | ICD-10-CM | POA: Diagnosis not present

## 2017-04-15 DIAGNOSIS — I5043 Acute on chronic combined systolic (congestive) and diastolic (congestive) heart failure: Secondary | ICD-10-CM | POA: Diagnosis not present

## 2017-04-15 DIAGNOSIS — I509 Heart failure, unspecified: Secondary | ICD-10-CM | POA: Diagnosis not present

## 2017-04-15 DIAGNOSIS — R0603 Acute respiratory distress: Secondary | ICD-10-CM | POA: Diagnosis not present

## 2017-04-15 DIAGNOSIS — I2510 Atherosclerotic heart disease of native coronary artery without angina pectoris: Secondary | ICD-10-CM | POA: Diagnosis not present

## 2017-04-15 DIAGNOSIS — R262 Difficulty in walking, not elsewhere classified: Secondary | ICD-10-CM | POA: Diagnosis not present

## 2017-04-15 DIAGNOSIS — I5022 Chronic systolic (congestive) heart failure: Secondary | ICD-10-CM | POA: Diagnosis not present

## 2017-04-15 DIAGNOSIS — I482 Chronic atrial fibrillation: Secondary | ICD-10-CM | POA: Diagnosis not present

## 2017-04-15 DIAGNOSIS — Z9981 Dependence on supplemental oxygen: Secondary | ICD-10-CM | POA: Diagnosis not present

## 2017-04-15 DIAGNOSIS — I10 Essential (primary) hypertension: Secondary | ICD-10-CM | POA: Diagnosis not present

## 2017-04-15 DIAGNOSIS — I270 Primary pulmonary hypertension: Secondary | ICD-10-CM | POA: Diagnosis not present

## 2017-04-15 DIAGNOSIS — I4891 Unspecified atrial fibrillation: Secondary | ICD-10-CM | POA: Diagnosis not present

## 2017-04-15 DIAGNOSIS — J189 Pneumonia, unspecified organism: Secondary | ICD-10-CM | POA: Diagnosis not present

## 2017-04-15 DIAGNOSIS — Z7401 Bed confinement status: Secondary | ICD-10-CM | POA: Diagnosis not present

## 2017-04-15 DIAGNOSIS — N179 Acute kidney failure, unspecified: Secondary | ICD-10-CM | POA: Diagnosis not present

## 2017-04-15 DIAGNOSIS — M6281 Muscle weakness (generalized): Secondary | ICD-10-CM | POA: Diagnosis not present

## 2017-04-15 DIAGNOSIS — I481 Persistent atrial fibrillation: Secondary | ICD-10-CM | POA: Diagnosis not present

## 2017-04-15 DIAGNOSIS — R0602 Shortness of breath: Secondary | ICD-10-CM | POA: Diagnosis not present

## 2017-04-15 DIAGNOSIS — E440 Moderate protein-calorie malnutrition: Secondary | ICD-10-CM | POA: Diagnosis not present

## 2017-04-15 DIAGNOSIS — I951 Orthostatic hypotension: Secondary | ICD-10-CM | POA: Diagnosis not present

## 2017-04-15 LAB — CBC
HCT: 36.8 % (ref 35.0–47.0)
Hemoglobin: 12.1 g/dL (ref 12.0–16.0)
MCH: 30.7 pg (ref 26.0–34.0)
MCHC: 33 g/dL (ref 32.0–36.0)
MCV: 93.3 fL (ref 80.0–100.0)
PLATELETS: 247 10*3/uL (ref 150–440)
RBC: 3.95 MIL/uL (ref 3.80–5.20)
RDW: 14.3 % (ref 11.5–14.5)
WBC: 9.3 10*3/uL (ref 3.6–11.0)

## 2017-04-15 LAB — BASIC METABOLIC PANEL
ANION GAP: 10 (ref 5–15)
BUN: 48 mg/dL — ABNORMAL HIGH (ref 6–20)
CALCIUM: 9.2 mg/dL (ref 8.9–10.3)
CO2: 33 mmol/L — ABNORMAL HIGH (ref 22–32)
Chloride: 102 mmol/L (ref 101–111)
Creatinine, Ser: 1.65 mg/dL — ABNORMAL HIGH (ref 0.44–1.00)
GFR calc Af Amer: 30 mL/min — ABNORMAL LOW (ref >60)
GFR, EST NON AFRICAN AMERICAN: 26 mL/min — AB (ref >60)
Glucose, Bld: 103 mg/dL — ABNORMAL HIGH (ref 65–99)
POTASSIUM: 3.8 mmol/L (ref 3.5–5.1)
SODIUM: 145 mmol/L (ref 135–145)

## 2017-04-15 LAB — PROTIME-INR
INR: 2.23
PROTHROMBIN TIME: 25.1 s — AB (ref 11.4–15.2)

## 2017-04-15 MED ORDER — GUAIFENESIN-DM 100-10 MG/5ML PO SYRP: 5.0000 mL | ORAL_SOLUTION | ORAL | 0 refills | Status: DC | PRN

## 2017-04-15 NOTE — Telephone Encounter (Signed)
-----   Message from Blain Pais sent at 04/15/2017 11:58 AM EDT ----- Regarding: tcm/ph 8/23 10:40 Dr. Rockey Situ

## 2017-04-15 NOTE — Clinical Social Work Note (Signed)
Pt is ready for discharge today and will go to [facility]. [employer] [facility] Kaufmann has been obtained. Pt and daughter are aware and agreeable to discharge plan. Facility is ready to admit pt as they have received discharge information. RN will call report.  [facility] will provide transportation. CSW is signing off as no further needs identified.   Darden Dates, MSW, LCSW  Clinical Social Worker  [phone]

## 2017-04-15 NOTE — Progress Notes (Signed)
Report called to Paul[facility] at [facility]. EMS notified of transport need. IV removed and patient awaiting transport. Daughter at bedside. AVS included in D/C packet for facility.

## 2017-04-15 NOTE — Telephone Encounter (Addendum)
Patient contacted regarding discharge from [facility] on 04/15/17. Spoke w/ pt's daughter, Windy Kalata.  Patient understands to follow up with Dr. Rockey Situ on 05/05/17 at 05/05/17 at [facility]. Patient understands discharge instructions? yes Patient understands medications and regiment? yes Patient understands to bring all medications to this visit? yes  Mrs. Geanie Cooley states that she is concerned that pt is being d/c'd and that Dr. Rockey Situ is not aware and may not be agreeable.  Reassured her that pt would need to be cleared in order to be d/c'd and that Dr. Rockey Situ has rounded on her today. She states that she put a note on the board in pt's room for him to call her when he arrived and she has been waiting for his call.

## 2017-04-15 NOTE — Discharge Summary (Addendum)
[facility] NAME: [name]    MR#:  [id number]  DATE OF BIRTH:  12-24-1921  DATE OF ADMISSION:  04/07/2017   ADMITTING PHYSICIAN: Baxter Hire, MD  DATE OF DISCHARGE: 04/15/2017 PRIMARY CARE PHYSICIAN: Tower, Wynelle Fanny, MD   ADMISSION DIAGNOSIS:  Anemia of decreased vitamin B12 absorption [D51.8] Healthcare-associated pneumonia [J18.9] DISCHARGE DIAGNOSIS:  Principal Problem:   Acute respiratory distress Active Problems:   CAD in native artery   Acute on chronic combined systolic (congestive) and diastolic (congestive) heart failure (HCC)   Chronic atrial fibrillation (HCC)   Pneumonia   Pressure injury of skin   AKI (acute kidney injury) ([place])   Hypomagnesemia   Palliative care encounter  SECONDARY DIAGNOSIS:   Past Medical History:  Diagnosis Date  . Allergy   . Anemia   . Arthritis    osteo  . CAD (coronary artery disease)    a. 2 DES, [facility], 2004; b. nuc 2013 septal dyssenergy 2/2 LBBB, nl LV fxn;  c. 06/2015 MV: EF 41%, septal HK (LBBB), no ischemia->Low risk.  . Chronic atrial fibrillation (HCC)    a. on Coumadin (CHA2DS2VASc = 6); b. Holter 06/2010, no brady, mild tachy.  . Chronic systolic CHF (congestive heart failure) ([place])    a. 03/2015 Echo: EF 30-35%;  b. 01/2016 Echo: EF 30-35%.  . Compression fracture of lumbar vertebra ([place]) 05-2008  . COPD (chronic obstructive pulmonary disease) ([place])   . Diverticulosis of colon   . Dyslipidemia   . Essential hypertension   . Family history of adverse reaction to anesthesia    daughter PONV  . GERD (gastroesophageal reflux disease)   . Hard of hearing   . Hematemesis    a. 03/2015.  [name] Hx of colonoscopy   . Hypothyroid   . Ischemic cardiomyopathy    a. 03/2015 Echo: EF 30-35%, ant, antsept HK, mod MR, mildly dil LA/RV, mod dil RA, mod-sev TR, PASP 79mmHg;  b. 01/2016 Echo: EF 30-35%, ant/antsept HK, mildly dil Ao root (3.4cm) and Asc Ao (3.5cm), mild [email or web address]  bi-atrial enlargement, mild to mod TR. PASP 28mmHg.  [name] LBBB (left bundle branch block)   . Lung abnormality    Dr [name] 122/2011 no further w/u needed  . Mitral regurgitation   . Myocardial infarction [facility]) 2016   "mild heart attack"  . Nausea    Some nausea with medications, May, 2013,  . Nocturia   . OA (osteoarthritis)   . Orthostatic hypotension    June, 2014  . Osteoporosis   . Pleural effusion associated with pulmonary infection 03-2007  . Prolonged QT interval    when on sotolol in past  . Pulmonary hypertension ([place])    a. 03/2015 PASP 32mmHg by echo.  . Shingles 2014  . Thrombocytopenia ([place])   . TR (tricuspid regurgitation)    moderate, echo 2008  . Urinary frequency   . Vitamin B deficiency   . Warfarin anticoagulation   . Wears dentures   . Wears glasses   . Weight loss    August, 2012   HOSPITAL COURSE:   83 year old female with past medical history of hypertension, COPD, osteoarthritis, chronic atrial fibrillation, history of coronary artery disease, hyperlipidemia, GERD, previous history of MI who presented to the hospital due to shortness of breath, cough.  1. Acute respiratory failure with hypoxia-suspected be secondary to pneumonia and also CHF.  She has been treated wtih IV Cefepime, IV lasix,  incentive  spirometry and antitussives.  Weaned off oxygen. Clinically improved.  2. Pneumonia-completed IV cefepime, follow blood, sputum cultures which are (-) so far.  -Continue antitussives and supportive care. Improved.  3. CHF-acute on chronic systolic dysfunction.  -Diuresing well with IV Lasix and as per cardiology  Decreased and hold IV Lasix to 20 mg twice a day due to worsening renal function. Resume home torsemide 20 mg po daily per Dr. Rockey Situ. Continue metoprolol, hold ACE due to AKI and previous Orthostatic Hypotension.   4. Acute kidney injury- possibly ATN. Due to lasix and CHF. better. Stopped IV Lasix, Resume home torsemide 20 mg po daily  per Dr. Rockey Situ. Renal dose meds and avoid nephrotoxins.  Follow-up BMP as outpatient.  5. Hypothyroidism-continue Synthroid.  6. Essential hypertension-continue metoprolol.  7. Hyperlipidemia-continue simvastatin.  8. GERD-continue Protonix.  9. History of chronic atrial fibrillation-rate controlled. Continue metoprolol. Continue Coumadin, INR therapeutic presently.  10. Hypokalemia/Hypomagnesemia -improved.  * Severe malnutrition in context of chronic illness. Follow-up dietitian recommendation.  Poor prognosis. Discussed with  Dr. Rockey Situ. And palliative care staff She will be discharged to SNF with palliative care follow up. The patient does not want to come back to hospital and prefer transition to hospice care. DISCHARGE CONDITIONS:  Stable, but poor prognosis. Discharge to SNF with palliative care follow-up today. CONSULTS OBTAINED:  Treatment Team:  Minna Merritts, MD DRUG ALLERGIES:   Allergies  Allergen Reactions  . Sulfonamide Derivatives Nausea And Vomiting    REACTION: sick  . Alendronate Sodium Other (See Comments)    REACTION: GI   DISCHARGE MEDICATIONS:   Allergies as of 04/15/2017      Reactions   Sulfonamide Derivatives Nausea And Vomiting   REACTION: sick   Alendronate Sodium Other (See Comments)   REACTION: GI      Medication List    STOP taking these medications   azithromycin 250 MG tablet Commonly known as:  ZITHROMAX Z-PAK     TAKE these medications   calcium carbonate 600 MG Tabs tablet Commonly known as:  OS-CAL Take 1,200 mg by mouth every evening.   docusate sodium 100 MG capsule Commonly known as:  COLACE Take 100 mg by mouth at bedtime.   fluticasone 50 MCG/ACT nasal spray Commonly known as:  FLONASE 2 SPRAYS IN BOTH NOSTRILS DAILY   Glucosamine HCl 500 MG Tabs Take 500 mg by mouth daily.   guaiFENesin-dextromethorphan 100-10 MG/5ML syrup Commonly known as:  ROBITUSSIN DM Take 5 mLs by mouth every 4 (four) hours  as needed for cough.   levothyroxine 88 MCG tablet Commonly known as:  SYNTHROID, LEVOTHROID TAKE ONE TABLET BY MOUTH EVERY DAY   metoprolol tartrate 25 MG tablet Commonly known as:  LOPRESSOR Take 0.5 tablets (12.5 mg total) by mouth 2 (two) times daily. What changed:  additional instructions   midodrine 5 MG tablet Commonly known as:  PROAMATINE TAKE ONE TABLET BY MOUTH TWICE DAILY WITH A MEAL   mirtazapine 15 MG tablet Commonly known as:  REMERON TAKE 1 TABLET BY MOUTH AT BEDTIME   multivitamin tablet Take 1 tablet by mouth daily.   multivitamin-lutein Caps capsule Take 1 capsule by mouth daily.   pantoprazole 40 MG tablet Commonly known as:  PROTONIX TAKE ONE TABLET BY MOUTH EVERY DAY   potassium chloride 10 MEQ tablet Commonly known as:  K-DUR Take 1 tablet (10 mEq total) by mouth as needed (take with lasix).   simvastatin 20 MG tablet Commonly known as:  ZOCOR TAKE 1/2 TABLET  BY MOUTH EVERY EVENING   SYSTANE OP Apply 1 drop to eye daily.   torsemide 20 MG tablet Commonly known as:  DEMADEX Take 1 tablet (20 mg total) by mouth daily.   warfarin 5 MG tablet Commonly known as:  COUMADIN Take 1 tablet (5 mg total) by mouth daily before supper. Take 1 - 2 pills daily AS DIRECTED BY ANTI-COAGULATION CLINIC What changed:  how much to take  additional instructions        DISCHARGE INSTRUCTIONS:  See AVS.  If you experience worsening of your admission symptoms, develop shortness of breath, life threatening emergency, suicidal or homicidal thoughts you must seek medical attention immediately by calling 911 or calling your MD immediately  if symptoms less severe.  You Must read complete instructions/literature along with all the possible adverse reactions/side effects for all the Medicines you take and that have been prescribed to you. Take any new Medicines after you have completely understood and accpet all the possible adverse reactions/side effects.    Please note  You were cared for by a hospitalist during your hospital stay. If you have any questions about your discharge medications or the care you received while you were in the hospital after you are discharged, you can call the unit and asked to speak with the hospitalist on call if the hospitalist that took care of you is not available. Once you are discharged, your primary care physician will handle any further medical issues. Please note that NO REFILLS for any discharge medications will be authorized once you are discharged, as it is imperative that you return to your primary care physician (or establish a relationship with a primary care physician if you do not have one) for your aftercare needs so that they can reassess your need for medications and monitor your lab values.    On the day of Discharge:  VITAL SIGNS:  Blood pressure 110/64, pulse 98, temperature 98 F (36.7 C), temperature source Oral, resp. rate 18, height 5\' 7"  (1.702 m), weight 117 lb 1 oz (53.1 kg), SpO2 95 %. PHYSICAL EXAMINATION:  GENERAL:  83 y.o.-year-old patient lying in the bed with no acute distress. Malnutrition and fragile. EYES: Pupils equal, round, reactive to light and accommodation. No scleral icterus. Extraocular muscles intact.  HEENT: Head atraumatic, normocephalic. Oropharynx and nasopharynx clear.  NECK:  Supple, no jugular venous distention. No thyroid enlargement, no tenderness.  LUNGS: Normal breath sounds bilaterally, no wheezing, bibasilar mild rales, no rhonchi or crepitation. No use of accessory muscles of respiration.  CARDIOVASCULAR: S1, S2 normal. No murmurs, rubs, or gallops.  ABDOMEN: Soft, non-tender, non-distended. Bowel sounds present. No organomegaly or mass.  EXTREMITIES: No pedal edema, cyanosis, or clubbing.  NEUROLOGIC: Cranial nerves II through XII are intact. Muscle strength 3-4/5 in all extremities. Sensation intact. Gait not checked.  PSYCHIATRIC: The patient is alert  and oriented x 3.  SKIN: No obvious rash, lesion, or ulcer. Bruise on legs. DATA REVIEW:   CBC  Recent Labs Lab 04/15/17 0306  WBC 9.3  HGB 12.1  HCT 36.8  PLT 247    Chemistries   Recent Labs Lab 04/09/17 0503  04/13/17 0447  04/15/17 0306  NA 137  < > 147*  < > 145  K 4.0  < > 5.1  < > 3.8  CL 110  < > 105  < > 102  CO2 22  < > 33*  < > 33*  GLUCOSE 98  < > 106*  < >  103*  BUN 39*  < > 32*  < > 48*  CREATININE 1.54*  < > 1.66*  < > 1.65*  CALCIUM 7.8*  < > 9.4  < > 9.2  MG  --   < > 1.9  --   --   AST 34  --   --   --   --   ALT 19  --   --   --   --   ALKPHOS 71  --   --   --   --   BILITOT 0.9  --   --   --   --   < > = values in this interval not displayed.   Microbiology Results  Results for orders placed or performed during the hospital encounter of 04/07/17  Culture, blood (routine x 2)     Status: None   Collection Time: 04/07/17  2:14 PM  Result Value Ref Range Status   Specimen Description BLOOD BLOOD LEFT FOREARM  Final   Special Requests   Final    BOTTLES DRAWN AEROBIC AND ANAEROBIC Blood Culture results may not be optimal due to an excessive volume of blood received in culture bottles   Culture NO GROWTH 5 DAYS  Final   Report Status 04/12/2017 FINAL  Final  Culture, blood (routine x 2)     Status: None   Collection Time: 04/07/17  2:14 PM  Result Value Ref Range Status   Specimen Description BLOOD RIGHT ANTECUBITAL  Final   Special Requests   Final    BOTTLES DRAWN AEROBIC AND ANAEROBIC Blood Culture adequate volume   Culture NO GROWTH 5 DAYS  Final   Report Status 04/12/2017 FINAL  Final  MRSA PCR Screening     Status: None   Collection Time: 04/07/17  9:00 PM  Result Value Ref Range Status   MRSA by PCR NEGATIVE NEGATIVE Final    Comment:        The GeneXpert MRSA Assay (FDA approved for NASAL specimens only), is one component of a comprehensive MRSA colonization surveillance program. It is not intended to diagnose MRSA infection  nor to guide or monitor treatment for MRSA infections.   Urine culture     Status: None   Collection Time: 04/08/17  2:48 AM  Result Value Ref Range Status   Specimen Description URINE, RANDOM  Final   Special Requests NONE  Final   Culture   Final    NO GROWTH Performed at [facility], [address] [place], [place] [address]    Report Status 04/09/2017 FINAL  Final    RADIOLOGY:  No results found.   Management plans discussed with the patient, her daughter and they are in agreement.  CODE STATUS: DNR   TOTAL TIME TAKING CARE OF THIS PATIENT: 45 minutes.    Demetrios Loll M.D on 04/15/2017 at 11:58 AM  Between 7am to 6pm - Pager - [phone]  After 6pm go to [email or web address] - [employer] brewer [facility]  Office  [phone]  CC: Primary care physician; Tower, Wynelle Fanny, MD   Note: This dictation was prepared with Dragon dictation along with smaller phrase technology. Any transcriptional errors that result from this process are unintentional.

## 2017-04-15 NOTE — Progress Notes (Signed)
Palliative Medicine RN Note: Checked on pt for PMT follow up. She reports that she was able to walk w PT and that she has already gotten to [facility] today. Spoke with SW Judson Roch to provide update.  Marjie Skiff Iverna Hammac, RN, BSN, [facility] 04/15/2017 11:11 AM Cell [phone] 8:00-4:00 Monday-Friday Office [phone]

## 2017-04-15 NOTE — Progress Notes (Signed)
Patient Name: [name] Date of Encounter: 04/15/2017  Primary Cardiologist: [facility] Problem List     Principal Problem:   Acute respiratory distress Active Problems:   CAD in native artery   Acute on chronic combined systolic (congestive) and diastolic (congestive) heart failure (HCC)   Chronic atrial fibrillation (HCC)   Pneumonia   Pressure injury of skin   AKI (acute kidney injury) ([place])   Hypomagnesemia   Palliative care encounter     Subjective   Patient seen on rounds this evening, Doing well, eating well, 2 bowls of soup Breathing much improved over the past week Daughter at the bedside Daughter considering pursuing hospice assistance when patient gets home from rehabilitation  Inpatient Medications    Scheduled Meds: . calcium carbonate  1,000 mg Oral QPM  . cyanocobalamin  1,000 mcg Intramuscular Q30 days  . docusate sodium  100 mg Oral Daily  . fluticasone  1 spray Each Nare Daily  . levothyroxine  88 mcg Oral Daily  . metoprolol tartrate  12.5 mg Oral BID  . midodrine  5 mg Oral BID WC  . mirtazapine  15 mg Oral QHS  . multivitamin with minerals  1 tablet Oral Daily  . multivitamin-lutein  1 capsule Oral Daily  . nystatin  5 mL Oral QID  . pantoprazole  40 mg Oral Daily  . polyvinyl alcohol  1 drop Both Eyes Daily  . simvastatin  10 mg Oral QPM  . warfarin  5 mg Oral q1800  . Warfarin - Pharmacist Dosing Inpatient   Does not apply q1800   Continuous Infusions: . sodium chloride     PRN Meds: acetaminophen **OR** acetaminophen, guaiFENesin-dextromethorphan, ipratropium-albuterol, polyvinyl alcohol   Vital Signs    Vitals:   04/14/17 2049 04/14/17 2054 04/15/17 0434 04/15/17 0815  BP: 128/72  121/75 110/64  Pulse: 77  90 98  Resp:   17 18  Temp:   97.7 F (36.5 C) 98 F (36.7 C)  TempSrc:    Oral  SpO2:  95% 97% 95%  Weight:   117 lb 1 oz (53.1 kg)   Height:        Intake/Output Summary (Last 24 hours) at 04/15/17  1852 Last data filed at 04/15/17 1530  Gross per 24 hour  Intake              120 ml  Output              450 ml  Net             -330 ml   Filed Weights   04/13/17 0625 04/14/17 0451 04/15/17 0434  Weight: 118 lb 14.4 oz (53.9 kg) 118 lb 8 oz (53.8 kg) 117 lb 1 oz (53.1 kg)    Physical Exam     GEN: Frail and elderly appearing, in no acute distress. Supine in bed HEENT: Grossly normal.  Neck: Supple, no significant JVD, no carotid bruits, or masses. Cardiac: Irregularly irregular, II/VI systolic murmur, no rubs, or gallops. No clubbing, cyanosis, edema.  Radials/DP/PT 2+ and equal bilaterally.  Respiratory:  Diminished breath sounds bilaterally. GI: Soft, nontender, nondistended, BS + x 4. MS: no deformity or atrophy. Skin: warm and dry, no rash. Neuro:  Strength and sensation are intact. Psych: AAOx3.  Normal affect.  Labs    CBC  Recent Labs  04/15/17 0306  WBC 9.3  HGB 12.1  HCT 36.8  MCV 93.3  PLT 247   Basic  Metabolic Panel  Recent Labs  04/13/17 0447  04/14/17 0336 04/15/17 0306  NA 147*  --  147* 145  K 5.1  < > 4.1 3.8  CL 105  --  103 102  CO2 33*  --  34* 33*  GLUCOSE 106*  --  108* 103*  BUN 32*  --  45* 48*  CREATININE 1.66*  < > 1.82* 1.65*  CALCIUM 9.4  --  9.0 9.2  MG 1.9  --   --   --   < > = values in this interval not displayed. Liver Function Tests No results for input(s): AST, ALT, ALKPHOS, BILITOT, PROT, ALBUMIN in the last 72 hours. No results for input(s): LIPASE, AMYLASE in the last 72 hours. Cardiac Enzymes No results for input(s): CKTOTAL, CKMB, CKMBINDEX, TROPONINI in the last 72 hours. BNP Invalid input(s): POCBNP D-Dimer No results for input(s): DDIMER in the last 72 hours. Hemoglobin A1C No results for input(s): HGBA1C in the last 72 hours. Fasting Lipid Panel No results for input(s): CHOL, HDL, LDLCALC, TRIG, CHOLHDL, LDLDIRECT in the last 72 hours. Thyroid Function Tests No results for input(s): TSH, T4TOTAL,  T3FREE, THYROIDAB in the last 72 hours.  Invalid input(s): FREET3  Telemetry     Afib - Personally Reviewed  ECG    n/a - Personally Reviewed  Radiology    Dg Chest Port 1 View  Result Date: 04/11/2017 IMPRESSION: Stable cardiomegaly with bilateral pulmonary edema and pleural effusions concerning for congestive heart failure. Aortic atherosclerosis. Electronically Signed   By: Marijo Conception, M.D.   On: 04/11/2017 12:32    Cardiac Studies   TTE 04/11/2017: Study Conclusions  - Left ventricle: The cavity size was normal. Systolic function was mildly to moderately reduced. The estimated ejection fraction was in the range of 40% to 45%. Hypokinesis of the anteroseptal myocardium. Hypokinesis of the anterior myocardium. The study is not technically sufficient to allow evaluation of LV diastolic function. - Mitral valve: There was moderate to severe regurgitation. - Left atrium: The atrium was moderately dilated. - Right ventricle: The cavity size was mildly dilated. Wall thickness was normal. Systolic function was normal. - Right atrium: The atrium was moderately dilated. - Tricuspid valve: There was severe regurgitation. - Pulmonary arteries: Systolic pressure was severely elevated. PA peak pressure: 66 mm Hg (S).  Impressions:  - IVC measured 2 to 3 cm, severely dilated.  Patient Profile     83 y.o. female with history of CAD s/p remote PCI x 2 to the LAD in 2004, chronic Afib on Coumadin, chronic combined CHF, pulmonary hypertension, mitral regurgitation, prolonged QT, LBBB, orthostatic hypotension, carotid artery disease, HTN, thrombocytopenia, and chronic back pain who was admitted on 7/29 with cough and found to have acute respiratory distress 2/2 possible PNA and volume overload.   Assessment & Plan      ------ Acute respiratory distress  resolving pneumonia, acute on chronic systolic and diastolic CHF, Still with mild cough Torsemide 20 g  daily restarted Improved respiratory status Encouraged incentive spirometry  ----Acute renal failure Stable, creatinine down to 1.6, restarted on torsemide 20 daily  --- Acute on chronic diastolic and systolic CHF avoid ACE inhibitor, ARB given worsening renal failure Torsemide 20 daily, metoprolol 12 twice a day  --- Chronic atrial fibrillation Rate well to be well controlled, tolerating anticoagulation On metoprolol for rate control  Long discussion with patient's granddaughter concerning things to watch for in rehabilitation, outpatient follow-up. Discussion with daughter alone concerning palliative and  hospice resources Greater than 50% was spent in counseling and coordination of care with patient Total encounter time 35 minutes or more   Signed: Esmond Plants  M.D., Ph.D. [facility]

## 2017-04-15 NOTE — Progress Notes (Signed)
Patient discharged at 2000 via EMS to [facility].

## 2017-04-15 NOTE — Plan of Care (Signed)
Problem: Pain Managment: Goal: General experience of comfort will improve Outcome: Progressing Patient complained of shortness of breath, vital signs WNL, no complaint of pain, PRN breathing treatment given with improvement, continue to monitor.

## 2017-04-15 NOTE — Discharge Instructions (Signed)
Heart healthy diet. Fall precaution. palliative care follow up.

## 2017-04-15 NOTE — Progress Notes (Signed)
ANTICOAGULATION CONSULT NOTE - Follow up [facility] for warfarin Indication: atrial fibrillation  Allergies  Allergen Reactions  . Sulfonamide Derivatives Nausea And Vomiting    REACTION: sick  . Alendronate Sodium Other (See Comments)    REACTION: GI   Patient Measurements: Height: 5\' 7"  (170.2 cm) Weight: 117 lb 1 oz (53.1 kg) IBW/kg (Calculated) : 61.6  Vital Signs: Temp: 97.7 F (36.5 C) (08/03 0434) BP: 121/75 (08/03 0434) Pulse Rate: 90 (08/03 0434)  Labs:  Recent Labs  04/13/17 0447 04/13/17 1517 04/14/17 0336 04/15/17 0306  HGB  --   --   --  12.1  HCT  --   --   --  36.8  PLT  --   --   --  247  LABPROT 27.5*  --  28.1* 25.1*  INR 2.50  --  2.57 2.23  CREATININE 1.66* 1.88* 1.82*  --     Estimated Creatinine Clearance: 16.2 mL/min (A) (by C-G formula based on SCr of 1.82 mg/dL (H)).  Assessment: Pharmacy consulted to dose and monitor warfarin in this 83 year old female taking warfarin PTA for atrial fibrillation.   Home dose: warfarin 5 mg PO Tuesday, Friday, Sunday           Warfarin 7.5 mg PO Mon, Wed, Thurs, Sat Last dose PTA: 7/25 7/26  INR = 3.49 on admission 7/27 INR 4.31; held 7/28     INR= 3.22; 5 mg  7/29     INR= 3.31 held 7/30     INR =3.20 held 7/31     INR=2.64 5mg  8/1       INR 2.50  5mg  8/2       INR 2.57  5mg  8/3       INR 2.23  Goal of Therapy:  INR 2-3  Plan:  INR is therapeutic. Continue new dose of 5mg  daily. Follow up INR tomorrow AM.  INR is drifting down, however this could be a result of her recent hold. With continued decline in renal function and poor nutrition, will not increase the dose right now. If INR drifts more tomorrow, consider increasing dose to 7.5mg  on Sat and 5mg  all other days. CBC at least q 3 days  Pharmacy will continue to follow.  Ramond Dial, Pharm.D, BCPS Clinical Pharmacist  04/15/2017,6:47 AM

## 2017-04-15 NOTE — Care Management Important Message (Signed)
Important Message  Patient Details  Name: [name] MRN: [id number] Date of Birth: [birth year]   Medicare Important Message Given:  Yes    Shelbie Ammons, RN 04/15/2017, 9:01 AM

## 2017-04-18 ENCOUNTER — Ambulatory Visit: Payer: PPO | Admitting: Family

## 2017-04-19 DIAGNOSIS — I481 Persistent atrial fibrillation: Secondary | ICD-10-CM | POA: Diagnosis not present

## 2017-04-19 DIAGNOSIS — E440 Moderate protein-calorie malnutrition: Secondary | ICD-10-CM | POA: Diagnosis not present

## 2017-04-19 DIAGNOSIS — I5022 Chronic systolic (congestive) heart failure: Secondary | ICD-10-CM | POA: Diagnosis not present

## 2017-04-19 DIAGNOSIS — I951 Orthostatic hypotension: Secondary | ICD-10-CM | POA: Diagnosis not present

## 2017-04-19 DIAGNOSIS — I270 Primary pulmonary hypertension: Secondary | ICD-10-CM | POA: Diagnosis not present

## 2017-04-25 ENCOUNTER — Telehealth: Payer: Self-pay | Admitting: Cardiovascular Disease

## 2017-04-25 NOTE — Telephone Encounter (Signed)
Pt has torsemide on her med list, previously d/c'd from [facility], pt of HF clinic.  Attempted to contact Rhonda. No answer at # provided.

## 2017-04-25 NOTE — Telephone Encounter (Signed)
Rhonda with [facility] calling needing to know about patient weight going up and down  May need a PRN medication to help with that  There's been about a 3 pound difference since last night  Just need some advise or guidelines to help patient from retaining fluid   Please call back

## 2017-04-26 NOTE — Telephone Encounter (Signed)
Spoke w/ nurse @ [facility].   She reports that pt's wt has been fluctuating for the past week: 115.4, 115.6, 112.6, 112.8, 114, 112. She reports that pt takes her torsemide as scheduled, but does not have instructions for prn dosing it wt is up. Advised her that pt has not been seen since recent hospitalization, but has appt w/ Dr. Rockey Situ on 05/05/17.  She reports that pt is followed by the HF clinic, but she did not have their fax #. Provided her w/ fax # and advised her that I will forward this info to [facility] and Dr. Rockey Situ, as well.

## 2017-04-26 NOTE — Telephone Encounter (Signed)
Attempted to contact Rhonda. Social worker answered the phone, as there was no nurse at the desk. She put me on hold to find nurse, but after holding for > 5 mins, I hung up. Will attempt again later.

## 2017-04-27 ENCOUNTER — Ambulatory Visit: Payer: PPO | Admitting: Family Medicine

## 2017-04-27 NOTE — Telephone Encounter (Signed)
Spoke with [name] at [facility] regarding [facility]. She says that she was already contacted by Dr. Donivan Scull office and was told that since patient was not having symptoms that they could just continue to watch the weight. Should she gain >2 pounds overnight or >5 pounds in one week, may need a PRN PM dose of diuretic.

## 2017-05-02 ENCOUNTER — Encounter: Payer: Self-pay | Admitting: Internal Medicine

## 2017-05-02 LAB — PROTIME-INR

## 2017-05-03 ENCOUNTER — Telehealth: Payer: Self-pay | Admitting: Family Medicine

## 2017-05-03 ENCOUNTER — Telehealth: Payer: Self-pay

## 2017-05-03 DIAGNOSIS — R627 Adult failure to thrive: Secondary | ICD-10-CM

## 2017-05-03 DIAGNOSIS — J449 Chronic obstructive pulmonary disease, unspecified: Secondary | ICD-10-CM

## 2017-05-03 DIAGNOSIS — I272 Pulmonary hypertension, unspecified: Secondary | ICD-10-CM

## 2017-05-03 DIAGNOSIS — I5043 Acute on chronic combined systolic (congestive) and diastolic (congestive) heart failure: Secondary | ICD-10-CM

## 2017-05-03 DIAGNOSIS — I4819 Other persistent atrial fibrillation: Secondary | ICD-10-CM

## 2017-05-03 NOTE — Telephone Encounter (Signed)
This is a referral for hospice after being at rehab at [facility] She wishes to go with [facility]  I will route to PCP  Thanks

## 2017-05-03 NOTE — Telephone Encounter (Signed)
Daughter, [name], wanted to confirm appointment for mother's next INR check.  I spoke with [facility], patient's INR was 1.8 yesterday and Dr. Silvio Pate made changes. Will recheck patient's INR next week (8/29) when she comes in for her appointment to see Dr. Glori Bickers. LM for [name] on her cell phone with this information.

## 2017-05-04 ENCOUNTER — Other Ambulatory Visit: Payer: Self-pay | Admitting: Cardiovascular Disease

## 2017-05-04 ENCOUNTER — Other Ambulatory Visit: Payer: Self-pay | Admitting: Family Medicine

## 2017-05-04 NOTE — Telephone Encounter (Signed)
Please refill for a year  

## 2017-05-04 NOTE — Progress Notes (Signed)
Cardiology Office Note  Date:  05/05/2017   ID:  [name], DOB 22-Jan-1922, MRN [id number]  PCP:  Abner Greenspan, MD   Chief Complaint  Patient presents with  . other    Follow up from [facility] CHF. Meds reviewed by the pt. verbally. "doing well."     HPI:  83 y.o. female with h/o  coronary artery disease,  atrial fibrillation on Coumadin,  chronic combined systolic and diastolic CHF,  mitral regurgitation, tricuspid regurgitation,  prolonged QT, LBBB,  carotid disease, HTN, thrombocytopenia, Moderate pulmonary hypertension, ejection fraction 30-35% Prior stress test with no ischemia Previous history of orthostatic hypotension, takes midodrine twice a day with improved symptoms She presents today for follow-up of her acute on chronic systolic and diastolic CHF and pulmonary hypertension  Recent hospital admission early August 2018 for anemia, pneumonia Hospital records reviewed with the patient in detail She presented with shortness of breath and coughing Treated with antibiotics and Lasix Placed back on torsemide at the time of discharge  Still weak, working with physical therapy, nursing  Lab work reviewed showing creatinine 1.65, BUN 48 04/15/2017 Creatinine was 1.19 in February 2018  EKG on today's visit shows atrial fibrillation with left bundle branch block, ventricular rate 105 bpm  Other past medical history reviewed hospital admission January 2018 for shortness of breath,Flu positive, CHF exacerbation Weight at that time was 135 pounds on admission Discharged 10/14/2016 after diuresis and treatment of flu she was treated with IV Lasix while inpatient Weight now 122 up to 123 pounds She is taking Lasix every other day with potassium every other day Weight has been relatively stable, now watching her salt intake   Other past medical history reviewed Underwent lumbar back surgery over the summer 2017, reports that she did well Pain has improved Limited  mobility   presented to the ED on 04/07/15 with complaints of nausea, bloody emesis, burning chest pain that extended into the neck, shortness of breath, and worsening weakness and productive cough.  Echocardiogram at the time showed moderate pulmonary hypertension, significant TR, ejection fraction was reduced 30-35% Outpatient stress test showed no ischemia, anteroseptal perfusion defect consistent with bundle branch block She is currently living at [facility] She does take midodrine in the morning and evening for orthostatic hypotension   Echo 04/07/15 showed normal LV size, EF 30-35% (down from Echo on 02/28/13: EF 50-55%), hypokenesis of the anteroseptal myocardium, MV moderate regurg, mildly dilated left atrium and right ventricle, moderate to severe TR, and PA peak pressure 57 mmHg.   PMH:   has a past medical history of Allergy; Anemia; Arthritis; CAD (coronary artery disease); Chronic atrial fibrillation ([place]); Chronic systolic CHF (congestive heart failure) ([place]); Compression fracture of lumbar vertebra (HCC) (05-2008); COPD (chronic obstructive pulmonary disease) ([place]); Diverticulosis of colon; Dyslipidemia; Essential hypertension; Family history of adverse reaction to anesthesia; GERD (gastroesophageal reflux disease); Hard of hearing; Hematemesis; colonoscopy; Hypothyroid; Ischemic cardiomyopathy; LBBB (left bundle branch block); Lung abnormality; Mitral regurgitation; Myocardial infarction ([place]) (2016); Nausea; Nocturia; OA (osteoarthritis); Orthostatic hypotension; Osteoporosis; Pleural effusion associated with pulmonary infection (03-2007); Prolonged QT interval; Pulmonary hypertension ([place]); Shingles (2014); Thrombocytopenia ([place]); TR (tricuspid regurgitation); Urinary frequency; Vitamin B deficiency; Warfarin anticoagulation; Wears dentures; Wears glasses; and Weight loss.  PSH:    Past Surgical History:  Procedure Laterality Date  . COLONOSCOPY    . CORONARY ANGIOPLASTY  2004  .  LUMBAR LAMINECTOMY/DECOMPRESSION MICRODISCECTOMY N/A 03/23/2016   Procedure: Lumbar three-four Laminectomy/Foraminotomy, posterior laterial arthrodesis;  Surgeon: Leeroy Cha,  MD;  Location: [place] NEURO ORS;  Service: Neurosurgery;  Laterality: N/A;  L3-4 Laminectomy/Foraminotomy  . THYROIDECTOMY  1962  . TONSILLECTOMY      Current Outpatient Prescriptions  Medication Sig Dispense Refill  . calcium carbonate (OS-CAL) 600 MG TABS tablet Take 1,200 mg by mouth every evening.     . docusate sodium (COLACE) 100 MG capsule Take 100 mg by mouth at bedtime.     . fluticasone (FLONASE) 50 MCG/ACT nasal spray 2 SPRAYS IN BOTH NOSTRILS DAILY 16 g 5  . Glucosamine HCl 500 MG TABS Take 500 mg by mouth daily.     [name] guaiFENesin-dextromethorphan (ROBITUSSIN DM) 100-10 MG/5ML syrup Take 5 mLs by mouth every 4 (four) hours as needed for cough. 118 mL 0  . levothyroxine (SYNTHROID, LEVOTHROID) 88 MCG tablet TAKE ONE TABLET BY MOUTH EVERY DAY 90 tablet 1  . metoprolol tartrate (LOPRESSOR) 25 MG tablet Take 0.5 tablets (12.5 mg total) by mouth 2 (two) times daily. (Patient taking differently: Take 12.5 mg by mouth 2 (two) times daily. HOLD evening dose if BP is under 110) 180 tablet 3  . midodrine (PROAMATINE) 5 MG tablet TAKE ONE TABLET TWICE DAILY WITH A MEAL 180 tablet 0  . mirtazapine (REMERON) 15 MG tablet TAKE ONE TABLET AT BEDTIME 90 tablet 3  . Multiple Vitamin (MULTIVITAMIN) tablet Take 1 tablet by mouth daily.     . multivitamin-lutein (OCUVITE-LUTEIN) CAPS capsule Take 1 capsule by mouth daily.    . pantoprazole (PROTONIX) 40 MG tablet TAKE ONE TABLET BY MOUTH EVERY DAY 90 tablet 1  . Polyethyl Glycol-Propyl Glycol (SYSTANE OP) Apply 1 drop to eye daily.    . potassium chloride (K-DUR) 10 MEQ tablet Take 1 tablet (10 mEq total) by mouth as needed (take with lasix). 90 tablet 3  . simvastatin (ZOCOR) 20 MG tablet TAKE 1/2 TABLET BY MOUTH EVERY EVENING 45 tablet 1  . torsemide (DEMADEX) 20 MG tablet Take  1 tablet (20 mg total) by mouth daily. 30 tablet 5  . warfarin (COUMADIN) 5 MG tablet Take 1 tablet (5 mg total) by mouth daily before supper. Take 1 - 2 pills daily AS DIRECTED BY ANTI-COAGULATION CLINIC (Patient taking differently: Take 5-7.5 mg by mouth daily before supper. Take 5mg  on Tuesday, Friday, and Sunday. Take 7.5mg  on Mon, Wed, Thurs, and Sat.) 180 tablet 0   Current Facility-Administered Medications  Medication Dose Route Frequency Provider Last Rate Last Dose  . cyanocobalamin ((VITAMIN B-12)) injection 1,000 mcg  1,000 mcg Intramuscular Q30 days Tower, Wynelle Fanny, MD   1,000 mcg at 04/05/17 1100     Allergies:   Sulfonamide derivatives and Alendronate sodium   Social History:  The patient  reports that she quit smoking about 69 years ago. Her smoking use included Cigarettes. She has never used smokeless tobacco. She reports that she does not drink alcohol or use drugs.   Family History:   family history includes Cancer in her brother; Diabetes in her brother; Heart disease in her sister; Heart disease (age of onset: 75) in her mother; Leukemia in her sister; Stroke (age of onset: 52) in her father.    Review of Systems: Review of Systems  Respiratory: Negative.   Cardiovascular: Negative.   Gastrointestinal: Negative.   Musculoskeletal: Negative.   Neurological: Positive for weakness.  Psychiatric/Behavioral: Negative.   All other systems reviewed and are negative.    PHYSICAL EXAM: VS:  BP 104/65 (BP Location: Left Arm, Patient Position: Sitting, Cuff Size: Normal)  Pulse (!) 105   Ht 5\' 7"  (1.702 m)   Wt 114 lb 2 oz (51.8 kg)   BMI 17.87 kg/m  , BMI Body mass index is 17.87 kg/m. GEN: Thin woman in no acute distress , presenting in wheelchair HEENT: normal  Neck: no JVD, carotid bruits, or masses Cardiac: Irregularly irregular , tachycardicno murmurs, rubs, or gallops,no edema  Respiratory:  clear to auscultation bilaterally, normal work of breathing GI: soft,  nontender, nondistended, + BS MS: no deformity or atrophy  Skin: warm and dry, no rash Neuro:  Strength and sensation are intact Psych: euthymic mood, full affect    Recent Labs: 10/08/2016: Pro B Natriuretic peptide (BNP) 868.0 04/07/2017: B Natriuretic Peptide 880.0 04/09/2017: ALT 19 04/13/2017: Magnesium 1.9 04/15/2017: BUN 48; Creatinine, Ser 1.65; Hemoglobin 12.1; Platelets 247; Potassium 3.8; Sodium 145    Lipid Panel Lab Results  Component Value Date   CHOL 147 05/12/2015   HDL 46.00 05/12/2015   LDLCALC 83 05/12/2015   TRIG 91.0 05/12/2015      Wt Readings from Last 3 Encounters:  05/05/17 114 lb 2 oz (51.8 kg)  04/15/17 117 lb 1 oz (53.1 kg)  04/07/17 121 lb (54.9 kg)       ASSESSMENT AND PLAN:  Hypotension Borderline low blood pressure, on midodrine. Denies any significant symptoms  Persistent atrial fibrillation ([place]) - Plan: EKG 12-Lead Currently on anticoagulation, rate relatively well-controlled She will be except will risk to change to eliquis  2.5 mg twice a day  Acute on chronic combined systolic and diastolic heart failure ([place]) - Plan: EKG 12-Lead Recent hospital records reviewed Recommended that she stay on her torsemide 20 mg daily Dry weight 114 pounds Hold torsemide for weight less than 111 pounds, increase torsemide up to twice a day dosing for 118 pounds or higher  SOB (shortness of breath) - Plan: EKG 12-Lead Improved, at her baseline on torsemide 20 daily   Total encounter time more than 25 minutes  Greater than 50% was spent in counseling and coordination of care with the patient   Disposition:   F/U  6 months   Orders Placed This Encounter  Procedures  . EKG 12-Lead     Signed, Esmond Plants, M.D., Ph.D. 05/05/2017  [place], [place]

## 2017-05-04 NOTE — Telephone Encounter (Signed)
Hospice Referral faxed to [place]. Patients daughter notified.

## 2017-05-04 NOTE — Telephone Encounter (Signed)
Pt has f/u on 05/11/17, please advise

## 2017-05-05 ENCOUNTER — Encounter: Payer: Self-pay | Admitting: Cardiovascular Disease

## 2017-05-05 ENCOUNTER — Ambulatory Visit (INDEPENDENT_AMBULATORY_CARE_PROVIDER_SITE_OTHER): Admitting: Cardiovascular Disease

## 2017-05-05 VITALS — BP 104/65 | HR 105 | Ht 67.0 in | Wt 114.1 lb

## 2017-05-05 DIAGNOSIS — I481 Persistent atrial fibrillation: Secondary | ICD-10-CM

## 2017-05-05 DIAGNOSIS — I2510 Atherosclerotic heart disease of native coronary artery without angina pectoris: Secondary | ICD-10-CM | POA: Diagnosis not present

## 2017-05-05 DIAGNOSIS — J449 Chronic obstructive pulmonary disease, unspecified: Secondary | ICD-10-CM | POA: Diagnosis not present

## 2017-05-05 DIAGNOSIS — I5043 Acute on chronic combined systolic (congestive) and diastolic (congestive) heart failure: Secondary | ICD-10-CM | POA: Diagnosis not present

## 2017-05-05 DIAGNOSIS — I951 Orthostatic hypotension: Secondary | ICD-10-CM | POA: Diagnosis not present

## 2017-05-05 DIAGNOSIS — I4891 Unspecified atrial fibrillation: Secondary | ICD-10-CM | POA: Diagnosis not present

## 2017-05-05 DIAGNOSIS — I4819 Other persistent atrial fibrillation: Secondary | ICD-10-CM

## 2017-05-05 DIAGNOSIS — I10 Essential (primary) hypertension: Secondary | ICD-10-CM | POA: Diagnosis not present

## 2017-05-05 DIAGNOSIS — I255 Ischemic cardiomyopathy: Secondary | ICD-10-CM

## 2017-05-05 NOTE — Patient Instructions (Addendum)
Medication Instructions:   No medication changes made  Eliquis 2.5 twice a day, low dose  Labwork:  No new labs needed  Testing/Procedures:  No further testing at this time   Follow-Up: It was a pleasure seeing you in the office today. Please call us if you have new issues that need to be addressed before your next appt.  [phone]  Your physician wants you to follow-up in: 6 months.  You will receive a reminder letter in the mail two months in advance. If you don't receive a letter, please call our office to schedule the follow-up appointment.  If you need a refill on your cardiac medications before your next appointment, please call your pharmacy.

## 2017-05-05 NOTE — Telephone Encounter (Signed)
done

## 2017-05-06 ENCOUNTER — Ambulatory Visit: Payer: PPO

## 2017-05-07 ENCOUNTER — Emergency Department

## 2017-05-07 ENCOUNTER — Observation Stay
Admission: EM | Admit: 2017-05-07 | Discharge: 2017-05-10 | Disposition: A | Discharge status: Hospice | Attending: Internal Medicine | Admitting: Internal Medicine

## 2017-05-07 DIAGNOSIS — I255 Ischemic cardiomyopathy: Secondary | ICD-10-CM | POA: Insufficient documentation

## 2017-05-07 DIAGNOSIS — E785 Hyperlipidemia, unspecified: Secondary | ICD-10-CM | POA: Diagnosis not present

## 2017-05-07 DIAGNOSIS — Z87891 Personal history of nicotine dependence: Secondary | ICD-10-CM | POA: Insufficient documentation

## 2017-05-07 DIAGNOSIS — Z66 Do not resuscitate: Secondary | ICD-10-CM | POA: Insufficient documentation

## 2017-05-07 DIAGNOSIS — S3681XA Injury of peritoneum, initial encounter: Secondary | ICD-10-CM | POA: Insufficient documentation

## 2017-05-07 DIAGNOSIS — J449 Chronic obstructive pulmonary disease, unspecified: Secondary | ICD-10-CM | POA: Insufficient documentation

## 2017-05-07 DIAGNOSIS — Z882 Allergy status to sulfonamides status: Secondary | ICD-10-CM | POA: Insufficient documentation

## 2017-05-07 DIAGNOSIS — R55 Syncope and collapse: Secondary | ICD-10-CM

## 2017-05-07 DIAGNOSIS — W1830XA Fall on same level, unspecified, initial encounter: Secondary | ICD-10-CM | POA: Diagnosis not present

## 2017-05-07 DIAGNOSIS — S7001XA Contusion of right hip, initial encounter: Secondary | ICD-10-CM | POA: Diagnosis not present

## 2017-05-07 DIAGNOSIS — Y9301 Activity, walking, marching and hiking: Secondary | ICD-10-CM | POA: Diagnosis not present

## 2017-05-07 DIAGNOSIS — I5043 Acute on chronic combined systolic (congestive) and diastolic (congestive) heart failure: Secondary | ICD-10-CM | POA: Insufficient documentation

## 2017-05-07 DIAGNOSIS — Z7901 Long term (current) use of anticoagulants: Secondary | ICD-10-CM | POA: Diagnosis not present

## 2017-05-07 DIAGNOSIS — M25551 Pain in right hip: Secondary | ICD-10-CM | POA: Diagnosis not present

## 2017-05-07 DIAGNOSIS — I252 Old myocardial infarction: Secondary | ICD-10-CM | POA: Insufficient documentation

## 2017-05-07 DIAGNOSIS — S329XXA Fracture of unspecified parts of lumbosacral spine and pelvis, initial encounter for closed fracture: Secondary | ICD-10-CM | POA: Diagnosis present

## 2017-05-07 DIAGNOSIS — S32511A Fracture of superior rim of right pubis, initial encounter for closed fracture: Secondary | ICD-10-CM | POA: Diagnosis not present

## 2017-05-07 DIAGNOSIS — I700 Atherosclerosis of aorta: Secondary | ICD-10-CM | POA: Insufficient documentation

## 2017-05-07 DIAGNOSIS — Z955 Presence of coronary angioplasty implant and graft: Secondary | ICD-10-CM | POA: Insufficient documentation

## 2017-05-07 DIAGNOSIS — I272 Pulmonary hypertension, unspecified: Secondary | ICD-10-CM | POA: Insufficient documentation

## 2017-05-07 DIAGNOSIS — Z79899 Other long term (current) drug therapy: Secondary | ICD-10-CM | POA: Diagnosis not present

## 2017-05-07 DIAGNOSIS — R42 Dizziness and giddiness: Secondary | ICD-10-CM | POA: Diagnosis not present

## 2017-05-07 DIAGNOSIS — D518 Other vitamin B12 deficiency anemias: Secondary | ICD-10-CM | POA: Diagnosis present

## 2017-05-07 DIAGNOSIS — S79912A Unspecified injury of left hip, initial encounter: Secondary | ICD-10-CM | POA: Diagnosis not present

## 2017-05-07 DIAGNOSIS — N179 Acute kidney failure, unspecified: Secondary | ICD-10-CM | POA: Diagnosis not present

## 2017-05-07 DIAGNOSIS — K219 Gastro-esophageal reflux disease without esophagitis: Secondary | ICD-10-CM | POA: Diagnosis not present

## 2017-05-07 DIAGNOSIS — I110 Hypertensive heart disease with heart failure: Secondary | ICD-10-CM | POA: Insufficient documentation

## 2017-05-07 DIAGNOSIS — S32591A Other specified fracture of right pubis, initial encounter for closed fracture: Secondary | ICD-10-CM | POA: Diagnosis not present

## 2017-05-07 DIAGNOSIS — Y92002 Bathroom of unspecified non-institutional (private) residence single-family (private) house as the place of occurrence of the external cause: Secondary | ICD-10-CM | POA: Insufficient documentation

## 2017-05-07 DIAGNOSIS — I10 Essential (primary) hypertension: Secondary | ICD-10-CM | POA: Diagnosis not present

## 2017-05-07 DIAGNOSIS — R627 Adult failure to thrive: Secondary | ICD-10-CM | POA: Insufficient documentation

## 2017-05-07 DIAGNOSIS — T148XXA Other injury of unspecified body region, initial encounter: Secondary | ICD-10-CM

## 2017-05-07 DIAGNOSIS — I4891 Unspecified atrial fibrillation: Secondary | ICD-10-CM | POA: Diagnosis not present

## 2017-05-07 DIAGNOSIS — M25552 Pain in left hip: Secondary | ICD-10-CM | POA: Diagnosis not present

## 2017-05-07 DIAGNOSIS — D649 Anemia, unspecified: Secondary | ICD-10-CM | POA: Diagnosis not present

## 2017-05-07 DIAGNOSIS — S32501A Unspecified fracture of right pubis, initial encounter for closed fracture: Secondary | ICD-10-CM | POA: Diagnosis not present

## 2017-05-07 DIAGNOSIS — I482 Chronic atrial fibrillation: Secondary | ICD-10-CM | POA: Insufficient documentation

## 2017-05-07 DIAGNOSIS — S299XXA Unspecified injury of thorax, initial encounter: Secondary | ICD-10-CM | POA: Diagnosis not present

## 2017-05-07 DIAGNOSIS — E039 Hypothyroidism, unspecified: Secondary | ICD-10-CM | POA: Diagnosis not present

## 2017-05-07 DIAGNOSIS — S0990XA Unspecified injury of head, initial encounter: Secondary | ICD-10-CM | POA: Diagnosis not present

## 2017-05-07 LAB — BASIC METABOLIC PANEL
Anion gap: 11 (ref 5–15)
BUN: 44 mg/dL — ABNORMAL HIGH (ref 6–20)
CHLORIDE: 102 mmol/L (ref 101–111)
CO2: 30 mmol/L (ref 22–32)
CREATININE: 1.8 mg/dL — AB (ref 0.44–1.00)
Calcium: 9.7 mg/dL (ref 8.9–10.3)
GFR calc non Af Amer: 23 mL/min — ABNORMAL LOW (ref >60)
GFR, EST AFRICAN AMERICAN: 27 mL/min — AB (ref >60)
Glucose, Bld: 152 mg/dL — ABNORMAL HIGH (ref 65–99)
POTASSIUM: 4.5 mmol/L (ref 3.5–5.1)
SODIUM: 143 mmol/L (ref 135–145)

## 2017-05-07 LAB — TYPE AND SCREEN
ABO/RH(D): A NEG
ANTIBODY SCREEN: NEGATIVE

## 2017-05-07 LAB — URINALYSIS, COMPLETE (UACMP) WITH MICROSCOPIC
BACTERIA UA: NONE SEEN
BILIRUBIN URINE: NEGATIVE
Glucose, UA: NEGATIVE mg/dL
Hgb urine dipstick: NEGATIVE
KETONES UR: NEGATIVE mg/dL
LEUKOCYTES UA: NEGATIVE
Nitrite: NEGATIVE
PH: 7 (ref 5.0–8.0)
Protein, ur: NEGATIVE mg/dL
Specific Gravity, Urine: 1.009 (ref 1.005–1.030)

## 2017-05-07 LAB — CBC WITH DIFFERENTIAL/PLATELET
BASOS ABS: 0 10*3/uL (ref 0–0.1)
BASOS PCT: 1 %
EOS PCT: 2 %
Eosinophils Absolute: 0.1 10*3/uL (ref 0–0.7)
HCT: 35.5 % (ref 35.0–47.0)
Hemoglobin: 12.2 g/dL (ref 12.0–16.0)
LYMPHS PCT: 22 %
Lymphs Abs: 1.6 10*3/uL (ref 1.0–3.6)
MCH: 31.2 pg (ref 26.0–34.0)
MCHC: 34.2 g/dL (ref 32.0–36.0)
MCV: 91 fL (ref 80.0–100.0)
MONO ABS: 0.7 10*3/uL (ref 0.2–0.9)
Monocytes Relative: 10 %
NEUTROS ABS: 4.9 10*3/uL (ref 1.4–6.5)
Neutrophils Relative %: 65 %
PLATELETS: 123 10*3/uL — AB (ref 150–440)
RBC: 3.9 MIL/uL (ref 3.80–5.20)
RDW: 15.3 % — AB (ref 11.5–14.5)
WBC: 7.4 10*3/uL (ref 3.6–11.0)

## 2017-05-07 LAB — PROTIME-INR
INR: 2
Prothrombin Time: 23 seconds — ABNORMAL HIGH (ref 11.4–15.2)

## 2017-05-07 LAB — APTT: APTT: 30 s (ref 24–36)

## 2017-05-07 LAB — TROPONIN I: Troponin I: <0.03 ng/mL (ref <0.03)

## 2017-05-07 MED ORDER — MIDODRINE HCL 5 MG PO TABS
5.0000 mg | ORAL_TABLET | Freq: Two times a day (BID) | ORAL | Status: DC
  Administered 2017-05-08 – 2017-05-10 (×5): 5 mg via ORAL
  Filled 2017-05-07 (×5): qty 1

## 2017-05-07 MED ORDER — ADULT MULTIVITAMIN W/MINERALS CH
1.0000 | ORAL_TABLET | Freq: Every day | ORAL | Status: DC
  Administered 2017-05-08 – 2017-05-10 (×3): 1 via ORAL
  Filled 2017-05-07 (×3): qty 1

## 2017-05-07 MED ORDER — MORPHINE SULFATE (PF) 2 MG/ML IV SOLN: 2.0000 mg | INTRAVENOUS | Status: DC | PRN

## 2017-05-07 MED ORDER — SODIUM CHLORIDE 0.9 % IV SOLN
INTRAVENOUS | Status: DC
  Administered 2017-05-07: 18:00:00 via INTRAVENOUS

## 2017-05-07 MED ORDER — MIRTAZAPINE 15 MG PO TABS: 15.0000 mg | ORAL_TABLET | Freq: Every day | ORAL | Status: DC

## 2017-05-07 MED ORDER — CALCIUM CARBONATE 1250 (500 CA) MG PO TABS
1200.0000 mg | ORAL_TABLET | Freq: Every evening | ORAL | Status: DC
  Filled 2017-05-07: qty 1

## 2017-05-07 MED ORDER — ONDANSETRON HCL 4 MG/2ML IJ SOLN: 4.0000 mg | Freq: Four times a day (QID) | INTRAMUSCULAR | Status: DC | PRN

## 2017-05-07 MED ORDER — METOPROLOL TARTRATE 25 MG PO TABS
12.5000 mg | ORAL_TABLET | Freq: Two times a day (BID) | ORAL | Status: DC
  Administered 2017-05-09 – 2017-05-10 (×2): 12.5 mg via ORAL
  Filled 2017-05-07 (×4): qty 1

## 2017-05-07 MED ORDER — CALCIUM CARBONATE ANTACID 500 MG PO CHEW
2.5000 | CHEWABLE_TABLET | Freq: Every evening | ORAL | Status: DC
  Filled 2017-05-07 (×2): qty 3

## 2017-05-07 MED ORDER — SIMVASTATIN 10 MG PO TABS
10.0000 mg | ORAL_TABLET | Freq: Every evening | ORAL | Status: DC
  Administered 2017-05-08 – 2017-05-09 (×2): 10 mg via ORAL
  Filled 2017-05-07 (×4): qty 1

## 2017-05-07 MED ORDER — CYANOCOBALAMIN 1000 MCG/ML IJ SOLN
1000.0000 ug | INTRAMUSCULAR | Status: DC
  Administered 2017-05-08: 1000 ug via INTRAMUSCULAR
  Filled 2017-05-07: qty 1

## 2017-05-07 MED ORDER — ACETAMINOPHEN 650 MG RE SUPP
650.0000 mg | Freq: Four times a day (QID) | RECTAL | Status: DC | PRN
Start: 2017-05-07 — End: 2017-05-10

## 2017-05-07 MED ORDER — PANTOPRAZOLE SODIUM 40 MG PO TBEC
40.0000 mg | DELAYED_RELEASE_TABLET | Freq: Every day | ORAL | Status: DC
  Administered 2017-05-08 – 2017-05-10 (×3): 40 mg via ORAL
  Filled 2017-05-07 (×3): qty 1

## 2017-05-07 MED ORDER — FLUTICASONE PROPIONATE 50 MCG/ACT NA SUSP
2.0000 | Freq: Every day | NASAL | Status: DC
  Administered 2017-05-08 – 2017-05-10 (×3): 2 via NASAL
  Filled 2017-05-07: qty 16

## 2017-05-07 MED ORDER — ACETAMINOPHEN 325 MG PO TABS
650.0000 mg | ORAL_TABLET | Freq: Four times a day (QID) | ORAL | Status: DC | PRN
  Administered 2017-05-08 – 2017-05-09 (×2): 650 mg via ORAL
  Filled 2017-05-07 (×2): qty 2

## 2017-05-07 MED ORDER — GLUCOSAMINE HCL 500 MG PO TABS: 500.0000 mg | ORAL_TABLET | Freq: Every day | ORAL | Status: DC

## 2017-05-07 MED ORDER — LEVOTHYROXINE SODIUM 88 MCG PO TABS
88.0000 ug | ORAL_TABLET | Freq: Every day | ORAL | Status: DC
  Administered 2017-05-08 – 2017-05-10 (×3): 88 ug via ORAL
  Filled 2017-05-07 (×3): qty 1

## 2017-05-07 MED ORDER — ONDANSETRON HCL 4 MG PO TABS: 4.0000 mg | ORAL_TABLET | Freq: Four times a day (QID) | ORAL | Status: DC | PRN

## 2017-05-07 MED ORDER — OCUVITE-LUTEIN PO CAPS
1.0000 | ORAL_CAPSULE | Freq: Every day | ORAL | Status: DC
  Administered 2017-05-08 – 2017-05-10 (×3): 1 via ORAL
  Filled 2017-05-07 (×3): qty 1

## 2017-05-07 MED ORDER — GUAIFENESIN-DM 100-10 MG/5ML PO SYRP: 5.0000 mL | ORAL_SOLUTION | ORAL | Status: DC | PRN

## 2017-05-07 MED ORDER — DOCUSATE SODIUM 100 MG PO CAPS
100.0000 mg | ORAL_CAPSULE | Freq: Every day | ORAL | Status: DC
  Administered 2017-05-07 – 2017-05-09 (×3): 100 mg via ORAL
  Filled 2017-05-07 (×3): qty 1

## 2017-05-07 MED ORDER — HYDROCODONE-ACETAMINOPHEN 5-325 MG PO TABS: 1.0000 | ORAL_TABLET | ORAL | Status: DC | PRN

## 2017-05-07 NOTE — Consult Note (Signed)
ORTHOPAEDIC CONSULTATION  REQUESTING PHYSICIAN: Dustin Flock, MD  Chief Complaint: Right pelvic pain  HPI: [name] is a 83 y.o. female who complains of  right pelvic pain following a fall today.  The patient was brought to the emergency room where exam, x-rays, and CT scan show a minimally displaced pubic rami fractures on the right.  In addition, a large hematoma is seen in the pelvic region.  She has been on Coumadin and just started Eliquis as a substitute.  Patient is being admitted for care and observation.  I recommended surgical consult for the hematoma.  Her fractures are nonoperative in nature and will require symptomatic care only.  Past Medical History:  Diagnosis Date  . Allergy   . Anemia   . Arthritis    osteo  . CAD (coronary artery disease)    a. 2 DES, [facility], 2004; b. nuc 2013 septal dyssenergy 2/2 LBBB, nl LV fxn;  c. 06/2015 MV: EF 41%, septal HK (LBBB), no ischemia->Low risk.  . Chronic atrial fibrillation (HCC)    a. on Coumadin (CHA2DS2VASc = 6); b. Holter 06/2010, no brady, mild tachy.  . Chronic systolic CHF (congestive heart failure) ([place])    a. 03/2015 Echo: EF 30-35%;  b. 01/2016 Echo: EF 30-35%.  . Compression fracture of lumbar vertebra ([place]) 05-2008  . COPD (chronic obstructive pulmonary disease) ([place])   . Diverticulosis of colon   . Dyslipidemia   . Essential hypertension   . Family history of adverse reaction to anesthesia    daughter PONV  . GERD (gastroesophageal reflux disease)   . Hard of hearing   . Hematemesis    a. 03/2015.  [name] Hx of colonoscopy   . Hypothyroid   . Ischemic cardiomyopathy    a. 03/2015 Echo: EF 30-35%, ant, antsept HK, mod MR, mildly dil LA/RV, mod dil RA, mod-sev TR, PASP 33mmHg;  b. 01/2016 Echo: EF 30-35%, ant/antsept HK, mildly dil Ao root (3.4cm) and Asc Ao (3.5cm), mild [email or web address] bi-atrial enlargement, mild to mod TR. PASP 48mmHg.  [name] LBBB (left bundle branch block)   . Lung abnormality    Dr [name] 122/2011 no  further w/u needed  . Mitral regurgitation   . Myocardial infarction [facility]) 2016   "mild heart attack"  . Nausea    Some nausea with medications, May, 2013,  . Nocturia   . OA (osteoarthritis)   . Orthostatic hypotension    June, 2014  . Osteoporosis   . Pleural effusion associated with pulmonary infection 03-2007  . Prolonged QT interval    when on sotolol in past  . Pulmonary hypertension ([place])    a. 03/2015 PASP 59mmHg by echo.  . Shingles 2014  . Thrombocytopenia ([place])   . TR (tricuspid regurgitation)    moderate, echo 2008  . Urinary frequency   . Vitamin B deficiency   . Warfarin anticoagulation   . Wears dentures   . Wears glasses   . Weight loss    August, 2012   Past Surgical History:  Procedure Laterality Date  . COLONOSCOPY    . CORONARY ANGIOPLASTY  2004  . LUMBAR LAMINECTOMY/DECOMPRESSION MICRODISCECTOMY N/A 03/23/2016   Procedure: Lumbar three-four Laminectomy/Foraminotomy, posterior laterial arthrodesis;  Surgeon: Leeroy Cha, MD;  Location: [facility] NEURO ORS;  Service: Neurosurgery;  Laterality: N/A;  L3-4 Laminectomy/Foraminotomy  . THYROIDECTOMY  1962  . TONSILLECTOMY     Social History   Social History  . Marital status: Widowed    Spouse name: N/A  .  Number of children: N/A  . Years of education: N/A   Social History Main Topics  . Smoking status: Former Smoker    Types: Cigarettes    Quit date: 09/14/1947  . Smokeless tobacco: Never Used  . Alcohol use No  . Drug use: No  . Sexual activity: Not Currently   Other Topics Concern  . None   Social History Narrative   Very independent    Lives in [place] by herself          Family History  Problem Relation Age of Onset  . Heart disease Mother 26  . Stroke Father 34  . Heart disease Sister   . Cancer Brother        colon  . Diabetes Brother   . Leukemia Sister    Allergies  Allergen Reactions  . Sulfonamide Derivatives Nausea And Vomiting    REACTION: sick  . Alendronate Sodium Other  (See Comments)    REACTION: GI   Prior to Admission medications   Medication Sig Start Date End Date Taking? Authorizing Provider  calcium carbonate (OS-CAL) 600 MG TABS tablet Take 1,200 mg by mouth every evening.     [provider]  docusate sodium (COLACE) 100 MG capsule Take 100 mg by mouth at bedtime.     [provider]  fluticasone (FLONASE) 50 MCG/ACT nasal spray 2 SPRAYS IN BOTH NOSTRILS DAILY 05/28/16   Tower, [place] A, MD  Glucosamine HCl 500 MG TABS Take 500 mg by mouth daily.     [provider]  guaiFENesin-dextromethorphan (ROBITUSSIN DM) 100-10 MG/5ML syrup Take 5 mLs by mouth every 4 (four) hours as needed for cough. 04/15/17   Demetrios Loll, MD  levothyroxine (SYNTHROID, LEVOTHROID) 88 MCG tablet TAKE ONE TABLET BY MOUTH EVERY DAY 11/29/16   Tower, Wynelle Fanny, MD  metoprolol tartrate (LOPRESSOR) 25 MG tablet Take 0.5 tablets (12.5 mg total) by mouth 2 (two) times daily. Patient taking differently: Take 12.5 mg by mouth 2 (two) times daily. HOLD evening dose if BP is under 110 05/11/16   Gollan, Kathlene November, MD  midodrine (PROAMATINE) 5 MG tablet TAKE ONE TABLET TWICE DAILY WITH A MEAL 05/04/17   Minna Merritts, MD  mirtazapine (REMERON) 15 MG tablet TAKE ONE TABLET AT BEDTIME 05/05/17   Tower, Wynelle Fanny, MD  Multiple Vitamin (MULTIVITAMIN) tablet Take 1 tablet by mouth daily.     [provider]  multivitamin-lutein (OCUVITE-LUTEIN) CAPS capsule Take 1 capsule by mouth daily.    [provider]  pantoprazole (PROTONIX) 40 MG tablet TAKE ONE TABLET BY MOUTH EVERY DAY 11/29/16   Tower, Wynelle Fanny, MD  Polyethyl Glycol-Propyl Glycol (SYSTANE OP) Apply 1 drop to eye daily.    [provider]  potassium chloride (K-DUR) 10 MEQ tablet Take 1 tablet (10 mEq total) by mouth as needed (take with lasix). 10/15/16 05/05/17  Minna Merritts, MD  simvastatin (ZOCOR) 20 MG tablet TAKE 1/2 TABLET BY MOUTH EVERY EVENING 12/29/16   Tower, Wynelle Fanny, MD  torsemide  (DEMADEX) 20 MG tablet Take 1 tablet (20 mg total) by mouth daily. 03/15/17 05/05/17  Alisa Graff, FNP  warfarin (COUMADIN) 5 MG tablet Take 1 tablet (5 mg total) by mouth daily before supper. Take 1 - 2 pills daily AS DIRECTED BY ANTI-COAGULATION CLINIC Patient taking differently: Take 5-7.5 mg by mouth daily before supper. Take 5mg  on Tuesday, Friday, and Sunday. Take 7.5mg  on Mon, Wed, Thurs, and Sat. 03/01/17   Tower, [place]  A, MD   Dg Chest 1 View  Result Date: 05/07/2017 CLINICAL DATA:  Fall, syncope, right hip pain EXAM: CHEST  1 VIEW COMPARISON:  04/11/2017 FINDINGS: Resolution of pleural effusions.  No pneumothorax. Cardiomegaly.  Coronary stent.  Atheromatous aorta. Coarse interstitial opacities throughout both lungs with resolution of the interstitial edema seen previously. Previous lower thoracic vertebral augmentation. IMPRESSION: 1. Stable cardiomegaly with no acute findings. 2. Resolution pleural effusions and interstitial edema since previous exam. Electronically Signed   By: Lucrezia Europe M.D.   On: 05/07/2017 11:43   Ct Head Wo Contrast  Result Date: 05/07/2017 CLINICAL DATA:  Fall, confusion EXAM: CT HEAD WITHOUT CONTRAST TECHNIQUE: Contiguous axial images were obtained from the base of the skull through the vertex without intravenous contrast. COMPARISON:  05/07/2017 at 1100 hours FINDINGS: Motion degraded images. Brain: No evidence of acute infarction, hemorrhage, hydrocephalus, extra-axial collection or mass lesion/mass effect. Subcortical white matter and periventricular small vessel ischemic changes. Vascular: Intracranial atherosclerosis. Skull: Normal. Negative for fracture or focal lesion. Sinuses/Orbits: The visualized paranasal sinuses are essentially clear. The mastoid air cells are unopacified. Other: None. IMPRESSION: Motion degraded images. No evidence of acute intracranial abnormality. Small vessel ischemic changes. No interval change. Electronically Signed   By: Julian Hy M.D.   On: 05/07/2017 13:10   Ct Head Wo Contrast  Result Date: 05/07/2017 CLINICAL DATA:  Per EMS pt comes [facility]. Pt was using walker and became dizzy and fell. Pt does not remember passing out. EXAM: CT HEAD WITHOUT CONTRAST TECHNIQUE: Contiguous axial images were obtained from the base of the skull through the vertex without intravenous contrast. COMPARISON:  04/07/2015 FINDINGS: Brain: Diffuse parenchymal atrophy. Stable early bilateral basal ganglia mineralization. Patchy areas of hypoattenuation in deep and periventricular white matter bilaterally. Negative for acute intracranial hemorrhage, mass lesion, acute infarction, midline shift, or mass-effect. Acute infarct may be inapparent on noncontrast CT. Ventricles and sulci symmetric. Vascular: Atherosclerotic and physiologic intracranial calcifications. Skull: Normal. Negative for fracture or focal lesion. Sinuses/Orbits: Fluid level in the sphenoid sinus on the right. Remainder of visualized paranasal sinuses and mastoid air cells appear normally developed and well aerated. Other: None. IMPRESSION: 1. Negative for bleed or other acute intracranial process. 2. Atrophy and nonspecific white matter changes. 3. Sphenoid sinus disease. Electronically Signed   By: Lucrezia Europe M.D.   On: 05/07/2017 11:49   Ct Pelvis Wo Contrast  Result Date: 05/07/2017 CLINICAL DATA:  Pt with fall this am. Pt with continued pelvic pain. Per pt family pt with increased confusion over the last hour. MD aware of CT scan from earlier. MD wants repeated scan. EXAM: CT PELVIS WITHOUT CONTRAST TECHNIQUE: Multidetector CT imaging of the pelvis was performed following the standard protocol without intravenous contrast. COMPARISON:  None available FINDINGS: Urinary Tract: Urinary bladder is incompletely distended, with some mass effect from extraperitoneal hematoma anteriorly on the right. Bowel: Visualized portions of small bowel and colon are nondilated, without  wall thickening or adjacent inflammatory/edematous change. Vascular/Lymphatic: Extensive aortoiliac calcified plaque without aneurysm. Reproductive:  No mass or other significant abnormality Other: 10.1 x 4.6 cm extraperitoneal hematoma centered just cephalad to the right pubic symphysis related to acute fracture. Musculoskeletal: Comminuted fracture of superior right pubic ramus, and the inferior right pubic ramus. Minimally displaced fracture of the right sacral ala. No hip fracture or dislocation. Advanced degenerative disc disease L5-S1. IMPRESSION: 1. Right comminuted superior and inferior pubic ramus fractures with adjacent 10.1 cm extraperitoneal hematoma. 2. Minimally displaced  right sacral ala fracture. 3. Aortoiliac atherosclerosis  (ICD10-170.0) Electronically Signed   By: Lucrezia Europe M.D.   On: 05/07/2017 13:19   Dg Hip Unilat W Or Wo Pelvis 2-3 Views Right  Result Date: 05/07/2017 CLINICAL DATA:  Patient fell this morning and now has pain associated with the left hip. She also reportedly hit her head according to her daughter. EXAM: DG HIP (WITH OR WITHOUT PELVIS) 2-3V RIGHT COMPARISON:  12/10/2013 FINDINGS: Diffuse osteopenia.  No dislocation.  No definite fracture. Extensive iliofemoral arterial calcifications. Bilateral pelvic phleboliths. IMPRESSION: 1. Osteopenia with no definite fracture or other acute findings. 2. Ilio femoral atherosclerosis. Electronically Signed   By: Lucrezia Europe M.D.   On: 05/07/2017 11:46    Positive ROS: All other systems have been reviewed and were otherwise negative with the exception of those mentioned in the HPI and as above.  Physical Exam: General: Alert, no acute distress Cardiovascular: No pedal edema Respiratory: No cyanosis, no use of accessory musculature GI: No organomegaly, abdomen is soft and non-tender Skin: No lesions in the area of chief complaint Neurologic: Sensation intact distally Psychiatric: Patient is competent for consent with normal  mood and affect Lymphatic: No axillary or cervical lymphadenopathy  MUSCULOSKELETAL: The patient's right pelvic region anteriorly is tender.  Minimal swelling.  No ecchymosis noted.  Motion of the hip is satisfactory.  Neurovascular status good distally.  The left side of the hip and pelvis are normal.  Assessment: Minimally displaced pubic rami fractures with hematoma  Plan: Physical therapy will for ambulation with walker When stabilized.  will probably need skilled nursing care. Follow-up in my office in 2 weeks.   Park Breed, MD [phone]   05/07/2017 4:21 PM

## 2017-05-07 NOTE — NC FL2 (Signed)
[place] LEVEL OF CARE SCREENING TOOL     IDENTIFICATION  Patient Name: [name] Birthdate: [birth year] Sex: female Admission Date (Current Location): 05/07/2017  [place] and [place] Number:  Engineering geologist and Address:  [facility], [address] [place], [place] [address]      Provider Number: [id number]  Attending Physician Name and Address:  Dustin Flock, MD  Relative Name and Phone Number:       Current Level of Care: Hospital Recommended Level of Care: [place], [place] Prior Approval Number:    Date Approved/Denied:   PASRR Number:  [id number]  Discharge Plan: Domiciliary (Rest home)    Current Diagnoses: Patient Active Problem List   Diagnosis Date Noted  . Pelvic fracture ([place]) 05/07/2017  . COPD (chronic obstructive pulmonary disease) ([place]) 05/03/2017  . Palliative care encounter   . Acute respiratory distress 04/11/2017  . AKI (acute kidney injury) ([place]) 04/11/2017  . Hypomagnesemia 04/11/2017  . Pressure injury of skin 04/08/2017  . Cough 04/07/2017  . Wheezing 04/07/2017  . H/O: pneumonia 04/07/2017  . Muscle weakness (generalized)   . Unsteadiness on feet   . Influenza A   . Encounter for therapeutic drug monitoring 08/24/2016  . Long-term (current) use of anticoagulants 08/24/2016  . Arterial hypotension   . Adjustment disorder with mixed anxiety and depressed mood 06/09/2015  . Generalized weakness 05/12/2015  . Chronic systolic CHF (congestive heart failure) ([place]) 04/29/2015  . Cardiomyopathy, ischemic   . Pulmonary hypertension ([place])   . Adult failure to thrive   . Chronic atrial fibrillation ([place])   . Unstable angina ([place]) 04/07/2015  . Venous stasis dermatitis 06/19/2014  . Orthostatic hypotension   . Acute on chronic combined systolic (congestive) and diastolic (congestive) heart failure ([place]) 03/02/2013  . History of shingles 02/28/2013  . Nail  fungus 01/10/2013  . Hyperlipidemia 07/09/2011  . Hearing loss 07/09/2011  . Weight loss   . A-fib ([place])   . CAD in native artery   . Mitral regurgitation   . LBBB (left bundle branch block)   . Warfarin anticoagulation   . TR (tricuspid regurgitation)   . Dyslipidemia   . Lung abnormality   . Prolonged QT interval   . Coccydynia 12/03/2010  . Nonspecific (abnormal) findings on radiological and other examination of body structure 07/13/2010  . ABNORMAL CHEST XRAY 07/13/2010  . Shortness of breath 06/18/2010  . Essential hypertension 06/24/2009  . GOUT, UNSPECIFIED 03/20/2009  . GERD 03/18/2009  . Thrombocytopenia ([place]) 01/03/2009  . ALLERGIC RHINITIS 12/05/2008  . HOARSENESS 06/07/2008  . MEDIASTINAL LYMPHADENOPATHY 05/13/2008  . Other diseases of lung, not elsewhere classified 04/18/2008  . Venous (peripheral) insufficiency 09/13/2007  . FREQUENCY, URINARY 07/19/2007  . TINNITUS NOS 05/11/2007  . OSTEOPOROSIS NOS 05/11/2007  . HYPOTHYROIDISM 04/11/2007  . ANEMIA, B12 DEFICIENCY 04/11/2007    Orientation RESPIRATION BLADDER Height & Weight     Self, Time, Situation, Place  Normal Incontinent Weight: 114 lb (51.7 kg) Height:  5\' 7"  (170.2 cm)  BEHAVIORAL SYMPTOMS/MOOD NEUROLOGICAL BOWEL NUTRITION STATUS      Incontinent Diet (Normal)  AMBULATORY STATUS COMMUNICATION OF NEEDS Skin   Limited Assist Verbally Normal                       Personal Care Assistance Level of Assistance  Bathing, Feeding, Dressing, Total care Bathing Assistance: Limited assistance Feeding assistance: Limited assistance Dressing Assistance: Limited assistance Total Care  Assistance: Limited assistance   Functional Limitations Info  Sight, Hearing, Speech Sight Info: Adequate Hearing Info: Adequate (Hard of hearing raise your voice) Speech Info: Adequate    SPECIAL CARE FACTORS FREQUENCY                       Contractures Contractures Info: Not present    Additional  Factors Info  Allergies   Allergies Info: Sulfonamide derivatives,  alendronate sodium , Prednisone            Current Medications (05/07/2017):  This is the current hospital active medication list Current Facility-Administered Medications  Medication Dose Route Frequency Provider Last Rate Last Dose  . 0.9 %  sodium chloride infusion   Intravenous Continuous Dustin Flock, MD      . acetaminophen (TYLENOL) tablet 650 mg  650 mg Oral Q6H PRN Dustin Flock, MD       Or  . acetaminophen (TYLENOL) suppository 650 mg  650 mg Rectal Q6H PRN Dustin Flock, MD      . calcium carbonate (OS-CAL) tablet 1,200 mg  1,200 mg Oral QPM Dustin Flock, MD      . cyanocobalamin ((VITAMIN B-12)) injection 1,000 mcg  1,000 mcg Intramuscular Q30 days Tower, Roque Lias A, MD   1,000 mcg at 04/05/17 1100  . cyanocobalamin ((VITAMIN B-12)) injection 1,000 mcg  1,000 mcg Intramuscular Q30 days Dustin Flock, MD      . docusate sodium (COLACE) capsule 100 mg  100 mg Oral QHS Dustin Flock, MD      . fluticasone (FLONASE) 50 MCG/ACT nasal spray 2 spray  2 spray Each Nare Daily Dustin Flock, MD      . Glucosamine HCl TABS 500 mg  500 mg Oral Daily Dustin Flock, MD      . guaiFENesin-dextromethorphan (ROBITUSSIN DM) 100-10 MG/5ML syrup 5 mL  5 mL Oral Q4H PRN Dustin Flock, MD      . HYDROcodone-acetaminophen (NORCO/VICODIN) 5-325 MG per tablet 1-2 tablet  1-2 tablet Oral Q4H PRN Dustin Flock, MD      . levothyroxine (SYNTHROID, LEVOTHROID) tablet 88 mcg  88 mcg Oral Daily Dustin Flock, MD      . metoprolol tartrate (LOPRESSOR) tablet 12.5 mg  12.5 mg Oral BID Dustin Flock, MD      . midodrine (PROAMATINE) tablet 5 mg  5 mg Oral BID WC Dustin Flock, MD      . mirtazapine (REMERON) tablet 15 mg  15 mg Oral QHS Dustin Flock, MD      . morphine 2 MG/ML injection 2 mg  2 mg Intravenous Q4H PRN Dustin Flock, MD      . multivitamin tablet 1 tablet  1 tablet Oral Daily Dustin Flock, MD       . multivitamin-lutein (OCUVITE-LUTEIN) capsule 1 capsule  1 capsule Oral Daily Dustin Flock, MD      . ondansetron (ZOFRAN) tablet 4 mg  4 mg Oral Q6H PRN Dustin Flock, MD       Or  . ondansetron (ZOFRAN) injection 4 mg  4 mg Intravenous Q6H PRN Dustin Flock, MD      . pantoprazole (PROTONIX) EC tablet 40 mg  40 mg Oral Daily Dustin Flock, MD      . simvastatin (ZOCOR) tablet 10 mg  10 mg Oral QPM Dustin Flock, MD       Current Outpatient Prescriptions  Medication Sig Dispense Refill  . apixaban (ELIQUIS) 2.5 MG TABS tablet Take 2.5 mg by mouth 2 (two) times daily.    [name]  docusate sodium (COLACE) 100 MG capsule Take 100 mg by mouth at bedtime.     [name] levothyroxine (SYNTHROID, LEVOTHROID) 88 MCG tablet TAKE ONE TABLET BY MOUTH EVERY DAY 90 tablet 1  . metoprolol tartrate (LOPRESSOR) 25 MG tablet Take 0.5 tablets (12.5 mg total) by mouth 2 (two) times daily. (Patient taking differently: Take 12.5 mg by mouth 2 (two) times daily. HOLD evening dose if BP is under 110) 180 tablet 3  . midodrine (PROAMATINE) 5 MG tablet TAKE ONE TABLET TWICE DAILY WITH A MEAL 180 tablet 0  . mirtazapine (REMERON) 15 MG tablet TAKE ONE TABLET AT BEDTIME 90 tablet 3  . multivitamin-lutein (OCUVITE-LUTEIN) CAPS capsule Take 1 capsule by mouth daily.    . pantoprazole (PROTONIX) 40 MG tablet TAKE ONE TABLET BY MOUTH EVERY DAY 90 tablet 1  . potassium chloride (K-DUR) 10 MEQ tablet Take 1 tablet (10 mEq total) by mouth as needed (take with lasix). (Patient taking differently: Take 10 mEq by mouth daily. ) 90 tablet 3  . simvastatin (ZOCOR) 20 MG tablet TAKE 1/2 TABLET BY MOUTH EVERY EVENING 45 tablet 1  . torsemide (DEMADEX) 20 MG tablet Take 1 tablet (20 mg total) by mouth daily. 30 tablet 5  . calcium carbonate (OS-CAL) 600 MG TABS tablet Take 1,200 mg by mouth every evening.     . fluticasone (FLONASE) 50 MCG/ACT nasal spray 2 SPRAYS IN BOTH NOSTRILS DAILY (Patient not taking: Reported on 05/07/2017) 16 g 5   . Glucosamine HCl 500 MG TABS Take 500 mg by mouth daily.     [name] guaiFENesin-dextromethorphan (ROBITUSSIN DM) 100-10 MG/5ML syrup Take 5 mLs by mouth every 4 (four) hours as needed for cough. (Patient not taking: Reported on 05/07/2017) 118 mL 0  . Multiple Vitamin (MULTIVITAMIN) tablet Take 1 tablet by mouth daily.     Vladimir Faster Glycol-Propyl Glycol (SYSTANE OP) Apply 1 drop to eye daily.    [name] warfarin (COUMADIN) 5 MG tablet Take 1 tablet (5 mg total) by mouth daily before supper. Take 1 - 2 pills daily AS DIRECTED BY ANTI-COAGULATION CLINIC (Patient not taking: Reported on 05/07/2017) 180 tablet 0     Discharge Medications: Please see discharge summary for a list of discharge medications.  Relevant Imaging Results:  Relevant Lab Results:   Additional Information SSN:  [id number]  Joana Reamer, [place]

## 2017-05-07 NOTE — Clinical Social Work Note (Addendum)
Clinical Social Work Assessment  Patient Details  Name: VANEZA PICKART MRN: [id number] Date of Birth: [birth year]  Date of referral:  05/07/17               Reason for consult:  Facility Placement                Permission sought to share information with:  Family Supports, Customer service manager Permission granted to share information::  Yes, Verbal Permission Granted  Name::     Daughter Fuller Song [phone]  Agency::     Relationship::     Contact Information:     Housing/Transportation Living arrangements for the past 2 months:  [facility]) Source of Information:  Patient, Adult Children Patient Interpreter Needed:  None Criminal Activity/Legal Involvement Pertinent to Current Situation/Hospitalization:  No - Comment as needed Significant Relationships:  Adult Children, [place] Lives with:  Self Do you feel safe going back to the place where you live?  Yes Need for family participation in patient care:  Yes (Comment)  Care giving concerns:  Daughter wants her Mom to be comfortable and will consult with Hospice this week   Social Worker assessment / plan: LCSW met with family and patient and obtained verbal consent to assist with assessment and family. Information was collected to complete assessment and Fl2. LCSW was informed this patient is attached to [facility] and Mariann Laster the weekend Lewanda Rife was notified that patient is going to be admitted to hospital. Family member daughter Kanna  was informed a weekday liaison will contact her on the medical floor and consult with her on Monday. Daughter was informed no surgeries will take place- fractures will heal on their own and GI bleed may be controlled medically Patient has had some additional medical complications a 2 small fractures in pelvic region and has a GI bleed in her stomach. Patient daughter has spoken with all health care providers and has agreed to having Mom admitted to  hospital and will decide what to do with GI bleed later. LCSW provided support and agreed to complete the assessment and Fl2 and Passr number as patient may require STR. Patient resides at [facility] and has some home health support and is a patient of [facility] will consult with SW on the medical floor once assigned. Family was asked if they needed any more beverages and they accepted and LCSW provided as needed.  Artis Delay [phone]  Employment status:  Retired Forensic scientist:  Self Pay (Medicaid Pending) ([employer]) PT Recommendations:  Not assessed at this time Information / Referral to community resources:  Other (Comment Required), [place] ([place])  Patient/Family's Response to care:  Patient reports she feels comfortable as long as she does not move  Patient/Family's Understanding of and Emotional Response to Diagnosis, Current Treatment, and Prognosis: Daughter understands her mother has several serious medical conditions and only wants comfort care and non intrusive medical help for her Mom ( no blood transfusions)  Emotional Assessment Appearance:  Appears stated age Attitude/Demeanor/Rapport:   (Calm, resting) Affect (typically observed):  Accepting, Adaptable, Calm Orientation:  Oriented to Self, Oriented to Place, Oriented to  Time, Oriented to Situation Alcohol / Substance use:  Not Applicable Psych involvement (Current and /or in the community):  No (Comment)  Discharge Needs  Concerns to be addressed:  Care Coordination Readmission within the last 30 days:  No Current discharge risk:  Dependent with Mobility, Chronically ill  Barriers to Discharge:  Continued Medical Work up   Tia Masker 05/07/2017, 4:39 PM

## 2017-05-07 NOTE — Progress Notes (Addendum)
LCSW met with family and patient and obtained verbal consent to assist with assessment and family. Information was collected to complete assessment and Fl2. LCSW was informed this patient is attached to [facility] and Mariann Laster the weekend Lewanda Rife was notified that patient is going to be admitted to hospital. Family member daughter Shaida  was informed a weekday liaison will contact her on the medical floor and consult with her on Monday. Daughter was informed no surgeries will take place- fractures will heal on their own and GI bleed may be controlled medically Patient has had some additional medical complications a 2 small fractures in pelvic region and has a GI bleed in her stomach. Patient daughter has spoken with all health care providers and has agreed to having Mom admitted to hospital and will decide what to do with GI bleed later. LCSW provided support and agreed to complete the assessment and Fl2 and Passr number as patient may require STR.  Family will consult with SW on the medical floor once assigned. Family was asked if they needed any more beverages and they accepted and LCSW provided as needed.  [employer] LCSW [phone]

## 2017-05-07 NOTE — Progress Notes (Signed)
Pt with a b/p of 97/45. Pt has 12.5mg  of metoperlol ordered. Spoke with Dr. Jannifer [place] may hold night time dose.

## 2017-05-07 NOTE — H&P (Signed)
[place] [facility] [place] NAME: [name]    MR#:  [id number]  DATE OF BIRTH:  28-Apr-1922  DATE OF ADMISSION:  05/07/2017  PRIMARY CARE PHYSICIAN: Tower, Wynelle Fanny, MD   REQUESTING/REFERRING PHYSICIAN: Larae Grooms MD  CHIEF COMPLAINT:   Chief Complaint  Patient presents with  . Dizziness    HISTORY OF PRESENT ILLNESS: [name]  is a 83 y.o. female with a known history of  Multiple medical problems including coronary artery disease chronic atrial fibrillation, chronic systolic CHF, dyslipidemia, essential hypertension, GERD, hypothyroidism who was hospitalized for 9 days in June. At that time she was discharged to a rehabilitation facility with palliative care following her there. Patient was subsequently discharged to independent living facility 2 days ago. According to daughter she is able to ambulate with a walker. She does have a history of orthostatic hypotension. Patient was trying to ambulate and fell today. She was brought to the emergency room and is having difficulty with ambulation. Patient is noted to have a right coming in native superior-inferior pubic ramus fracture with a extra pericardial hematoma. According to patient's daughter hospice did change her anticoagulation from Coumadin to eliquis yesterday.    PAST MEDICAL HISTORY:   Past Medical History:  Diagnosis Date  . Allergy   . Anemia   . Arthritis    osteo  . CAD (coronary artery disease)    a. 2 DES, [facility], 2004; b. nuc 2013 septal dyssenergy 2/2 LBBB, nl LV fxn;  c. 06/2015 MV: EF 41%, septal HK (LBBB), no ischemia->Low risk.  . Chronic atrial fibrillation (HCC)    a. on Coumadin (CHA2DS2VASc = 6); b. Holter 06/2010, no brady, mild tachy.  . Chronic systolic CHF (congestive heart failure) ([place])    a. 03/2015 Echo: EF 30-35%;  b. 01/2016 Echo: EF 30-35%.  . Compression fracture of lumbar vertebra ([place]) 05-2008  . COPD (chronic obstructive pulmonary disease) ([place])    . Diverticulosis of colon   . Dyslipidemia   . Essential hypertension   . Family history of adverse reaction to anesthesia    daughter PONV  . GERD (gastroesophageal reflux disease)   . Hard of hearing   . Hematemesis    a. 03/2015.  [name] Hx of colonoscopy   . Hypothyroid   . Ischemic cardiomyopathy    a. 03/2015 Echo: EF 30-35%, ant, antsept HK, mod MR, mildly dil LA/RV, mod dil RA, mod-sev TR, PASP 55mmHg;  b. 01/2016 Echo: EF 30-35%, ant/antsept HK, mildly dil Ao root (3.4cm) and Asc Ao (3.5cm), mild [email or web address] bi-atrial enlargement, mild to mod TR. PASP 81mmHg.  [name] LBBB (left bundle branch block)   . Lung abnormality    Dr [name] 122/2011 no further w/u needed  . Mitral regurgitation   . Myocardial infarction  [facility]) 2016   "mild heart attack"  . Nausea    Some nausea with medications, May, 2013,  . Nocturia   . OA (osteoarthritis)   . Orthostatic hypotension    June, 2014  . Osteoporosis   . Pleural effusion associated with pulmonary infection 03-2007  . Prolonged QT interval    when on sotolol in past  . Pulmonary hypertension ([place])    a. 03/2015 PASP 54mmHg by echo.  . Shingles 2014  . Thrombocytopenia ([place])   . TR (tricuspid regurgitation)    moderate, echo 2008  . Urinary frequency   . Vitamin B deficiency   . Warfarin anticoagulation   . Wears dentures   .  Wears glasses   . Weight loss    August, 2012    PAST SURGICAL HISTORY: Past Surgical History:  Procedure Laterality Date  . COLONOSCOPY    . CORONARY ANGIOPLASTY  2004  . LUMBAR LAMINECTOMY/DECOMPRESSION MICRODISCECTOMY N/A 03/23/2016   Procedure: Lumbar three-four Laminectomy/Foraminotomy, posterior laterial arthrodesis;  Surgeon: Leeroy Cha, MD;  Location: [facility] NEURO ORS;  Service: Neurosurgery;  Laterality: N/A;  L3-4 Laminectomy/Foraminotomy  . THYROIDECTOMY  1962  . TONSILLECTOMY      SOCIAL HISTORY:  Social History  Substance Use Topics  . Smoking status: Former Smoker    Types: Cigarettes    Quit  date: 09/14/1947  . Smokeless tobacco: Never Used  . Alcohol use No    FAMILY HISTORY:  Family History  Problem Relation Age of Onset  . Heart disease Mother 49  . Stroke Father 78  . Heart disease Sister   . Cancer Brother        colon  . Diabetes Brother   . Leukemia Sister     DRUG ALLERGIES:  Allergies  Allergen Reactions  . Sulfonamide Derivatives Nausea And Vomiting    REACTION: sick  . Alendronate Sodium Other (See Comments)    REACTION: GI    REVIEW OF SYSTEMS:   CONSTITUTIONAL: No fever, fatigue or weakness.  EYES: No blurred or double vision.  EARS, NOSE, AND THROAT: No tinnitus or ear pain.  RESPIRATORY: No cough, shortness of breath, wheezing or hemoptysis.  CARDIOVASCULAR: No chest pain, orthopnea, edema.  GASTROINTESTINAL: No nausea, vomiting, diarrhea or abdominal pain.  GENITOURINARY: No dysuria, hematuria.  ENDOCRINE: No polyuria, nocturia,  HEMATOLOGY: No anemia, easy bruising or bleeding SKIN: No rash or lesion. MUSCULOSKELETAL: Positive pain in her right hip and pelvis NEUROLOGIC: No tingling, numbness, weakness.  PSYCHIATRY: No anxiety or depression.   MEDICATIONS AT HOME:  Prior to Admission medications   Medication Sig Start Date End Date Taking? Authorizing Provider  calcium carbonate (OS-CAL) 600 MG TABS tablet Take 1,200 mg by mouth every evening.     [provider]  docusate sodium (COLACE) 100 MG capsule Take 100 mg by mouth at bedtime.     [provider]  fluticasone (FLONASE) 50 MCG/ACT nasal spray 2 SPRAYS IN BOTH NOSTRILS DAILY 05/28/16   Tower, [place] A, MD  Glucosamine HCl 500 MG TABS Take 500 mg by mouth daily.     [provider]  guaiFENesin-dextromethorphan (ROBITUSSIN DM) 100-10 MG/5ML syrup Take 5 mLs by mouth every 4 (four) hours as needed for cough. 04/15/17   Demetrios Loll, MD  levothyroxine (SYNTHROID, LEVOTHROID) 88 MCG tablet TAKE ONE TABLET BY MOUTH EVERY DAY 11/29/16   Tower, Wynelle Fanny, MD  metoprolol  tartrate (LOPRESSOR) 25 MG tablet Take 0.5 tablets (12.5 mg total) by mouth 2 (two) times daily. Patient taking differently: Take 12.5 mg by mouth 2 (two) times daily. HOLD evening dose if BP is under 110 05/11/16   Gollan, Kathlene November, MD  midodrine (PROAMATINE) 5 MG tablet TAKE ONE TABLET TWICE DAILY WITH A MEAL 05/04/17   Minna Merritts, MD  mirtazapine (REMERON) 15 MG tablet TAKE ONE TABLET AT BEDTIME 05/05/17   Tower, Wynelle Fanny, MD  Multiple Vitamin (MULTIVITAMIN) tablet Take 1 tablet by mouth daily.     [provider]  multivitamin-lutein (OCUVITE-LUTEIN) CAPS capsule Take 1 capsule by mouth daily.    [provider]  pantoprazole (PROTONIX) 40 MG tablet TAKE ONE TABLET BY MOUTH EVERY DAY 11/29/16   Tower, Wynelle Fanny, MD  Polyethyl Glycol-Propyl Glycol (SYSTANE OP) Apply 1 drop to eye daily.    [provider]  potassium chloride (K-DUR) 10 MEQ tablet Take 1 tablet (10 mEq total) by mouth as needed (take with lasix). 10/15/16 05/05/17  Minna Merritts, MD  simvastatin (ZOCOR) 20 MG tablet TAKE 1/2 TABLET BY MOUTH EVERY EVENING 12/29/16   Tower, Wynelle Fanny, MD  torsemide (DEMADEX) 20 MG tablet Take 1 tablet (20 mg total) by mouth daily. 03/15/17 05/05/17  Alisa Graff, FNP  warfarin (COUMADIN) 5 MG tablet Take 1 tablet (5 mg total) by mouth daily before supper. Take 1 - 2 pills daily AS DIRECTED BY ANTI-COAGULATION CLINIC Patient taking differently: Take 5-7.5 mg by mouth daily before supper. Take 5mg  on Tuesday, Friday, and Sunday. Take 7.5mg  on Mon, Wed, Thurs, and Sat. 03/01/17   Tower, Wynelle Fanny, MD      PHYSICAL EXAMINATION:   VITAL SIGNS: Blood pressure (!) 126/106, pulse 65, temperature 97.9 F (36.6 C), temperature source Oral, resp. rate (!) 25, height 5\' 7"  (1.702 m), weight 114 lb (51.7 kg), SpO2 97 %.  GENERAL:  83 y.o.-year-old patient lying in the bed  Chronically ill appearing EYES: Pupils equal, round, reactive to light and accommodation. No scleral icterus.  Extraocular muscles intact.  HEENT: Head atraumatic, normocephalic. Oropharynx and nasopharynx clear.  NECK:  Supple, no jugular venous distention. No thyroid enlargement, no tenderness.  LUNGS: Normal breath sounds bilaterally, no wheezing, rales,rhonchi or crepitation. No use of accessory muscles of respiration.  CARDIOVASCULAR: S1, S2 normal. No murmurs, rubs, or gallops.  ABDOMEN: Soft, nontender, nondistended. Bowel sounds present. No organomegaly or mass.  EXTREMITIES: No pedal edema, cyanosis, or clubbing.  NEUROLOGIC: Cranial nerves II through XII are intact. Muscle strength 5/5 in all extremities. Sensation intact. Gait not checked.  PSYCHIATRIC: The patient is alert and oriented x 3.  SKIN: No obvious rash, lesion, or ulcer.   LABORATORY PANEL:   CBC  Recent Labs Lab 05/07/17 1030  WBC 7.4  HGB 12.2  HCT 35.5  PLT 123*  MCV 91.0  MCH 31.2  MCHC 34.2  RDW 15.3*  LYMPHSABS 1.6  MONOABS 0.7  EOSABS 0.1  BASOSABS 0.0   ------------------------------------------------------------------------------------------------------------------  Chemistries   Recent Labs Lab 05/07/17 1030  NA 143  K 4.5  CL 102  CO2 30  GLUCOSE 152*  BUN 44*  CREATININE 1.80*  CALCIUM 9.7   ------------------------------------------------------------------------------------------------------------------ estimated creatinine clearance is 15.9 mL/min (A) (by C-G formula based on SCr of 1.8 mg/dL (H)). ------------------------------------------------------------------------------------------------------------------ No results for input(s): TSH, T4TOTAL, T3FREE, THYROIDAB in the last 72 hours.  Invalid input(s): FREET3   Coagulation profile  Recent Labs Lab 05/07/17 1030  INR 2.00   ------------------------------------------------------------------------------------------------------------------- No results for input(s): DDIMER in the last 72  hours. -------------------------------------------------------------------------------------------------------------------  Cardiac Enzymes  Recent Labs Lab 05/07/17 1030  TROPONINI <0.03   ------------------------------------------------------------------------------------------------------------------ Invalid input(s): POCBNP  ---------------------------------------------------------------------------------------------------------------  Urinalysis    Component Value Date/Time   COLORURINE AMBER (A) 04/08/2017 0248   APPEARANCEUR CLEAR (A) 04/08/2017 0248   APPEARANCEUR Hazy 10/28/2011 1315   LABSPEC 1.018 04/08/2017 0248   LABSPEC 1.012 10/28/2011 [place] 5.0 04/08/2017 0248   GLUCOSEU NEGATIVE 04/08/2017 0248   GLUCOSEU Negative 10/28/2011 1315   HGBUR NEGATIVE 04/08/2017 0248   HGBUR negative 05/10/2008 [place] 04/08/2017 0248   BILIRUBINUR neg 03/08/2017 1629   BILIRUBINUR Negative 10/28/2011 [place] 04/08/2017 0248   PROTEINUR 30 (A) 04/08/2017 0248  UROBILINOGEN 0.2 03/08/2017 1629   UROBILINOGEN 0.2 02/28/2013 1329   NITRITE NEGATIVE 04/08/2017 0248   LEUKOCYTESUR TRACE (A) 04/08/2017 0248   LEUKOCYTESUR Negative 10/28/2011 1315     RADIOLOGY: Dg Chest 1 View  Result Date: 05/07/2017 CLINICAL DATA:  Fall, syncope, right hip pain EXAM: CHEST  1 VIEW COMPARISON:  04/11/2017 FINDINGS: Resolution of pleural effusions.  No pneumothorax. Cardiomegaly.  Coronary stent.  Atheromatous aorta. Coarse interstitial opacities throughout both lungs with resolution of the interstitial edema seen previously. Previous lower thoracic vertebral augmentation. IMPRESSION: 1. Stable cardiomegaly with no acute findings. 2. Resolution pleural effusions and interstitial edema since previous exam. Electronically Signed   By: Lucrezia Europe M.D.   On: 05/07/2017 11:43   Ct Head Wo Contrast  Result Date: 05/07/2017 CLINICAL DATA:  Fall, confusion  EXAM: CT HEAD WITHOUT CONTRAST TECHNIQUE: Contiguous axial images were obtained from the base of the skull through the vertex without intravenous contrast. COMPARISON:  05/07/2017 at 1100 hours FINDINGS: Motion degraded images. Brain: No evidence of acute infarction, hemorrhage, hydrocephalus, extra-axial collection or mass lesion/mass effect. Subcortical white matter and periventricular small vessel ischemic changes. Vascular: Intracranial atherosclerosis. Skull: Normal. Negative for fracture or focal lesion. Sinuses/Orbits: The visualized paranasal sinuses are essentially clear. The mastoid air cells are unopacified. Other: None. IMPRESSION: Motion degraded images. No evidence of acute intracranial abnormality. Small vessel ischemic changes. No interval change. Electronically Signed   By: Julian Hy M.D.   On: 05/07/2017 13:10   Ct Head Wo Contrast  Result Date: 05/07/2017 CLINICAL DATA:  Per EMS pt comes from [facility]. Pt was using walker and became dizzy and fell. Pt does not remember passing out. EXAM: CT HEAD WITHOUT CONTRAST TECHNIQUE: Contiguous axial images were obtained from the base of the skull through the vertex without intravenous contrast. COMPARISON:  04/07/2015 FINDINGS: Brain: Diffuse parenchymal atrophy. Stable early bilateral basal ganglia mineralization. Patchy areas of hypoattenuation in deep and periventricular white matter bilaterally. Negative for acute intracranial hemorrhage, mass lesion, acute infarction, midline shift, or mass-effect. Acute infarct may be inapparent on noncontrast CT. Ventricles and sulci symmetric. Vascular: Atherosclerotic and physiologic intracranial calcifications. Skull: Normal. Negative for fracture or focal lesion. Sinuses/Orbits: Fluid level in the sphenoid sinus on the right. Remainder of visualized paranasal sinuses and mastoid air cells appear normally developed and well aerated. Other: None. IMPRESSION: 1. Negative for bleed or other acute  intracranial process. 2. Atrophy and nonspecific white matter changes. 3. Sphenoid sinus disease. Electronically Signed   By: Lucrezia Europe M.D.   On: 05/07/2017 11:49   Ct Pelvis Wo Contrast  Result Date: 05/07/2017 CLINICAL DATA:  Pt with fall this am. Pt with continued pelvic pain. Per pt family pt with increased confusion over the last hour. MD aware of CT scan from earlier. MD wants repeated scan. EXAM: CT PELVIS WITHOUT CONTRAST TECHNIQUE: Multidetector CT imaging of the pelvis was performed following the standard protocol without intravenous contrast. COMPARISON:  None available FINDINGS: Urinary Tract: Urinary bladder is incompletely distended, with some mass effect from extraperitoneal hematoma anteriorly on the right. Bowel: Visualized portions of small bowel and colon are nondilated, without wall thickening or adjacent inflammatory/edematous change. Vascular/Lymphatic: Extensive aortoiliac calcified plaque without aneurysm. Reproductive:  No mass or other significant abnormality Other: 10.1 x 4.6 cm extraperitoneal hematoma centered just cephalad to the right pubic symphysis related to acute fracture. Musculoskeletal: Comminuted fracture of superior right pubic ramus, and the inferior right pubic ramus. Minimally displaced fracture of the right sacral  ala. No hip fracture or dislocation. Advanced degenerative disc disease L5-S1. IMPRESSION: 1. Right comminuted superior and inferior pubic ramus fractures with adjacent 10.1 cm extraperitoneal hematoma. 2. Minimally displaced right sacral ala fracture. 3. Aortoiliac atherosclerosis  (ICD10-170.0) Electronically Signed   By: Lucrezia Europe M.D.   On: 05/07/2017 13:19   Dg Hip Unilat W Or Wo Pelvis 2-3 Views Right  Result Date: 05/07/2017 CLINICAL DATA:  Patient fell this morning and now has pain associated with the left hip. She also reportedly hit her head according to her daughter. EXAM: DG HIP (WITH OR WITHOUT PELVIS) 2-3V RIGHT COMPARISON:  12/10/2013  FINDINGS: Diffuse osteopenia.  No dislocation.  No definite fracture. Extensive iliofemoral arterial calcifications. Bilateral pelvic phleboliths. IMPRESSION: 1. Osteopenia with no definite fracture or other acute findings. 2. Ilio femoral atherosclerosis. Electronically Signed   By: Lucrezia Europe M.D.   On: 05/07/2017 11:46    EKG: Orders placed or performed in visit on 05/05/17  . EKG 12-Lead    IMPRESSION AND PLAN: Patient is a 83 year old status post fall with pelvic fracture with a hematoma  1. Pelvic fracture with a hematoma Stop anticoagulation Monitor hemoglobin  This is a nonoperable condition I will ask orthopedic consult  2. Atrial fibrillation Continue metoprolol Anticoagulation will be on hold  3. Orthostatic hypotension continue midrodrine  4. Hypothyroidism continue Synthroid  5. GERD continue Protonix  6. Code DO NOT RESUSCITATE heartis poor ask palliative care to see she was started followed by hospice    All the records are reviewed and case discussed with ED provider. Management plans discussed with the patient, family and they are in agreement.  CODE STATUS:    Code Status Orders        Start     Ordered   05/07/17 1558  Do not attempt resuscitation (DNR)  Continuous    Question Answer Comment  In the event of cardiac or respiratory ARREST Do not call a "code blue"   In the event of cardiac or respiratory ARREST Do not perform Intubation, CPR, defibrillation or ACLS   In the event of cardiac or respiratory ARREST Use medication by any route, position, wound care, and other measures to relive pain and suffering. May use oxygen, suction and manual treatment of airway obstruction as needed for comfort.      05/07/17 1557    Code Status History    Date Active Date Inactive Code Status Order ID Comments User Context   04/07/2017  3:58 PM 04/15/2017 11:39 PM DNR [id number]  Baxter Hire, MD ED   10/10/2016  4:39 PM 10/14/2016  6:28 PM DNR [id number]   [name], MD Inpatient   03/24/2016  7:28 PM 04/02/2016  8:09 PM DNR [id number]  Leeroy Cha, MD Inpatient   03/23/2016  7:01 PM 03/24/2016  7:28 PM Full Code [id number]  Leeroy Cha, MD Inpatient   04/07/2015  7:41 PM 04/12/2015  4:13 PM DNR [id number]  Max Sane, MD Inpatient    Advance Directive Documentation     Most Recent Value  Type of Advance Directive  Out of facility DNR (pink MOST or yellow form)  Pre-existing out of facility DNR order (yellow form or pink MOST form)  -  "MOST" Form in Place?  -       TOTAL TIME TAKING CARE OF THIS PATIENT: 43minutes.    Dustin Flock M.D on 05/07/2017 at 3:59 PM  Between 7am to 6pm - Pager - [phone]  After 6pm go to  [email or web address] - password [email or web address] [facility]  [place] [facility]  Office  [phone]  CC: Primary care physician; Tower, Wynelle Fanny, MD

## 2017-05-07 NOTE — ED Notes (Signed)
Pt's family stating that pt is mumbling incoherently. Pt's nurse will be notified

## 2017-05-07 NOTE — ED Triage Notes (Signed)
Per EMS pt comes from [facility].  Pt was using walker and became dizzy and fell.  Pt does not remember passing out.  Pt states right hip pain and when walking it becomes worse.

## 2017-05-07 NOTE — ED Notes (Addendum)
Family states "patient is talking out of her mind". MD [place] notified

## 2017-05-07 NOTE — ED Notes (Signed)
MD Notified that patient remains altered

## 2017-05-07 NOTE — Progress Notes (Signed)
ADMISSION NOTE:  Pt admitted to room 145 from ED, Pt alert and oriented. Daughter at bedside. Admission screening done, sacral foam applied, stage 1 pressure pressure injury noted on coccyx, (foam applied)  pt oriented to call bell and room. Bed in lowest position, call bell in reach and bed alarm on.

## 2017-05-07 NOTE — ED Provider Notes (Signed)
[facility] Emergency Department Provider Note  ____________________________________________   First MD Initiated Contact with Patient 05/07/17 [phone]     (approximate)  I have reviewed the triage vital signs and the nursing notes.   HISTORY  Chief Complaint Dizziness   HPI [name] is a 83 y.o. female with a history of COPD, CAD, atrial fibrillation on Coumadin who is presenting after a fall. She says that she was walking to her bathroom after breakfast this morning with her walker when she began to feel dizzy as if the room was spinning. She believes that she passed out as well as hit her head on the way to the floor. She is denying any headache right now but says that she hit her head on the back of her head when she was falling. She says that she has no pain in her hips at this time but when she tries to move her right hip or when she tries to put weight on her right hip she feels severe pain. She denies any chest pain or shortness of breath. EMS found the patient be 80% on arrival to the scene and placed her on nasal cannula oxygen. However, in the emergency department so far she has had normal oxygen saturations on room air and will be maintained on room air. Patient offered pain medications but says that she would not like him at this time.   Past Medical History:  Diagnosis Date  . Allergy   . Anemia   . Arthritis    osteo  . CAD (coronary artery disease)    a. 2 DES, [facility], 2004; b. nuc 2013 septal dyssenergy 2/2 LBBB, nl LV fxn;  c. 06/2015 MV: EF 41%, septal HK (LBBB), no ischemia->Low risk.  . Chronic atrial fibrillation (HCC)    a. on Coumadin (CHA2DS2VASc = 6); b. Holter 06/2010, no brady, mild tachy.  . Chronic systolic CHF (congestive heart failure) ([place])    a. 03/2015 Echo: EF 30-35%;  b. 01/2016 Echo: EF 30-35%.  . Compression fracture of lumbar vertebra ([place]) 05-2008  . COPD (chronic obstructive pulmonary disease) ([place])   .  Diverticulosis of colon   . Dyslipidemia   . Essential hypertension   . Family history of adverse reaction to anesthesia    daughter PONV  . GERD (gastroesophageal reflux disease)   . Hard of hearing   . Hematemesis    a. 03/2015.  [name] Hx of colonoscopy   . Hypothyroid   . Ischemic cardiomyopathy    a. 03/2015 Echo: EF 30-35%, ant, antsept HK, mod MR, mildly dil LA/RV, mod dil RA, mod-sev TR, PASP 76mmHg;  b. 01/2016 Echo: EF 30-35%, ant/antsept HK, mildly dil Ao root (3.4cm) and Asc Ao (3.5cm), mild [email or web address] bi-atrial enlargement, mild to mod TR. PASP 26mmHg.  [name] LBBB (left bundle branch block)   . Lung abnormality    Dr [name] 122/2011 no further w/u needed  . Mitral regurgitation   . Myocardial infarction [facility]) 2016   "mild heart attack"  . Nausea    Some nausea with medications, May, 2013,  . Nocturia   . OA (osteoarthritis)   . Orthostatic hypotension    June, 2014  . Osteoporosis   . Pleural effusion associated with pulmonary infection 03-2007  . Prolonged QT interval    when on sotolol in past  . Pulmonary hypertension ([place])    a. 03/2015 PASP 99mmHg by echo.  . Shingles 2014  . Thrombocytopenia ([place])   .  TR (tricuspid regurgitation)    moderate, echo 2008  . Urinary frequency   . Vitamin B deficiency   . Warfarin anticoagulation   . Wears dentures   . Wears glasses   . Weight loss    August, 2012    Patient Active Problem List   Diagnosis Date Noted  . Pelvic fracture ([place]) 05/07/2017  . COPD (chronic obstructive pulmonary disease) ([place]) 05/03/2017  . Palliative care encounter   . Acute respiratory distress 04/11/2017  . AKI (acute kidney injury) ([place]) 04/11/2017  . Hypomagnesemia 04/11/2017  . Pressure injury of skin 04/08/2017  . Cough 04/07/2017  . Wheezing 04/07/2017  . H/O: pneumonia 04/07/2017  . Muscle weakness (generalized)   . Unsteadiness on feet   . Influenza A   . Encounter for therapeutic drug monitoring 08/24/2016  . Long-term (current) use of  anticoagulants 08/24/2016  . Arterial hypotension   . Adjustment disorder with mixed anxiety and depressed mood 06/09/2015  . Generalized weakness 05/12/2015  . Chronic systolic CHF (congestive heart failure) ([place]) 04/29/2015  . Cardiomyopathy, ischemic   . Pulmonary hypertension ([place])   . Adult failure to thrive   . Chronic atrial fibrillation ([place])   . Unstable angina ([place]) 04/07/2015  . Venous stasis dermatitis 06/19/2014  . Orthostatic hypotension   . Acute on chronic combined systolic (congestive) and diastolic (congestive) heart failure ([place]) 03/02/2013  . History of shingles 02/28/2013  . Nail fungus 01/10/2013  . Hyperlipidemia 07/09/2011  . Hearing loss 07/09/2011  . Weight loss   . A-fib ([place])   . CAD in native artery   . Mitral regurgitation   . LBBB (left bundle branch block)   . Warfarin anticoagulation   . TR (tricuspid regurgitation)   . Dyslipidemia   . Lung abnormality   . Prolonged QT interval   . Coccydynia 12/03/2010  . Nonspecific (abnormal) findings on radiological and other examination of body structure 07/13/2010  . ABNORMAL CHEST XRAY 07/13/2010  . Shortness of breath 06/18/2010  . Essential hypertension 06/24/2009  . GOUT, UNSPECIFIED 03/20/2009  . GERD 03/18/2009  . Thrombocytopenia ([place]) 01/03/2009  . ALLERGIC RHINITIS 12/05/2008  . HOARSENESS 06/07/2008  . MEDIASTINAL LYMPHADENOPATHY 05/13/2008  . Other diseases of lung, not elsewhere classified 04/18/2008  . Venous (peripheral) insufficiency 09/13/2007  . FREQUENCY, URINARY 07/19/2007  . TINNITUS NOS 05/11/2007  . OSTEOPOROSIS NOS 05/11/2007  . HYPOTHYROIDISM 04/11/2007  . ANEMIA, B12 DEFICIENCY 04/11/2007    Past Surgical History:  Procedure Laterality Date  . COLONOSCOPY    . CORONARY ANGIOPLASTY  2004  . LUMBAR LAMINECTOMY/DECOMPRESSION MICRODISCECTOMY N/A 03/23/2016   Procedure: Lumbar three-four Laminectomy/Foraminotomy, posterior laterial arthrodesis;  Surgeon: Leeroy Cha, MD;   Location: [facility] NEURO ORS;  Service: Neurosurgery;  Laterality: N/A;  L3-4 Laminectomy/Foraminotomy  . THYROIDECTOMY  1962  . TONSILLECTOMY      Prior to Admission medications   Medication Sig Start Date End Date Taking? Authorizing Provider  calcium carbonate (OS-CAL) 600 MG TABS tablet Take 1,200 mg by mouth every evening.     [provider]  docusate sodium (COLACE) 100 MG capsule Take 100 mg by mouth at bedtime.     [provider]  fluticasone (FLONASE) 50 MCG/ACT nasal spray 2 SPRAYS IN BOTH NOSTRILS DAILY 05/28/16   Tower, Vayas A, MD  Glucosamine HCl 500 MG TABS Take 500 mg by mouth daily.     [provider]  guaiFENesin-dextromethorphan (ROBITUSSIN DM) 100-10 MG/5ML syrup Take 5 mLs by mouth every 4 (four) hours  as needed for cough. 04/15/17   Demetrios Loll, MD  levothyroxine (SYNTHROID, LEVOTHROID) 88 MCG tablet TAKE ONE TABLET BY MOUTH EVERY DAY 11/29/16   Tower, Wynelle Fanny, MD  metoprolol tartrate (LOPRESSOR) 25 MG tablet Take 0.5 tablets (12.5 mg total) by mouth 2 (two) times daily. Patient taking differently: Take 12.5 mg by mouth 2 (two) times daily. HOLD evening dose if BP is under 110 05/11/16   Gollan, Kathlene November, MD  midodrine (PROAMATINE) 5 MG tablet TAKE ONE TABLET TWICE DAILY WITH A MEAL 05/04/17   Minna Merritts, MD  mirtazapine (REMERON) 15 MG tablet TAKE ONE TABLET AT BEDTIME 05/05/17   Tower, Wynelle Fanny, MD  Multiple Vitamin (MULTIVITAMIN) tablet Take 1 tablet by mouth daily.     [provider]  multivitamin-lutein (OCUVITE-LUTEIN) CAPS capsule Take 1 capsule by mouth daily.    [provider]  pantoprazole (PROTONIX) 40 MG tablet TAKE ONE TABLET BY MOUTH EVERY DAY 11/29/16   Tower, Wynelle Fanny, MD  Polyethyl Glycol-Propyl Glycol (SYSTANE OP) Apply 1 drop to eye daily.    [provider]  potassium chloride (K-DUR) 10 MEQ tablet Take 1 tablet (10 mEq total) by mouth as needed (take with lasix). 10/15/16 05/05/17  Minna Merritts, MD    simvastatin (ZOCOR) 20 MG tablet TAKE 1/2 TABLET BY MOUTH EVERY EVENING 12/29/16   Tower, Wynelle Fanny, MD  torsemide (DEMADEX) 20 MG tablet Take 1 tablet (20 mg total) by mouth daily. 03/15/17 05/05/17  Alisa Graff, FNP  warfarin (COUMADIN) 5 MG tablet Take 1 tablet (5 mg total) by mouth daily before supper. Take 1 - 2 pills daily AS DIRECTED BY ANTI-COAGULATION CLINIC Patient taking differently: Take 5-7.5 mg by mouth daily before supper. Take 5mg  on Tuesday, Friday, and Sunday. Take 7.5mg  on Mon, Wed, Thurs, and Sat. 03/01/17   Tower, Wynelle Fanny, MD    Allergies Sulfonamide derivatives and Alendronate sodium  Family History  Problem Relation Age of Onset  . Heart disease Mother 105  . Stroke Father 48  . Heart disease Sister   . Cancer Brother        colon  . Diabetes Brother   . Leukemia Sister     Social History Social History  Substance Use Topics  . Smoking status: Former Smoker    Types: Cigarettes    Quit date: 09/14/1947  . Smokeless tobacco: Never Used  . Alcohol use No    Review of Systems  Constitutional: No fever/chills Eyes: No visual changes. ENT: No sore throat. Cardiovascular: Denies chest pain. Respiratory: Denies shortness of breath. Gastrointestinal: No abdominal pain.  No nausea, no vomiting.  No diarrhea.  No constipation. Genitourinary: Negative for dysuria. Musculoskeletal: Negative for back pain. Skin: Negative for rash. Neurological: Negative for headaches, focal weakness or numbness.   ____________________________________________   PHYSICAL EXAM:  VITAL SIGNS: ED Triage Vitals  Enc Vitals Group     BP 05/07/17 1024 118/63     Pulse Rate 05/07/17 1026 89     Resp 05/07/17 1024 17     Temp 05/07/17 1030 97.9 F (36.6 C)     Temp Source 05/07/17 1030 Oral     SpO2 --      Weight 05/07/17 1030 114 lb (51.7 kg)     Height 05/07/17 1030 5\' 7"  (1.702 m)     Head Circumference --      Peak Flow --      Pain Score --      Pain  Loc --      Pain  Edu? --      Excl. in [place]? --     Constitutional: Alert and oriented. Well appearing and in no acute distress. Eyes: Conjunctivae are normal.  Head: Atraumatic.No tenderness to palpation of the occiput. No ecchymosis, depression or deformity. Nose: No congestion/rhinnorhea. Mouth/Throat: Mucous membranes are moist.  Neck: No stridor.  No tenderness to palpation to the midline cervical spine. No deformity or step-off. Cardiovascular: Normal rate, irregularly irregular rhythm. Grossly normal heart sounds.  Good peripheral circulation with equal and bilateral dorsalis pedis pulses. Respiratory: Normal respiratory effort.  No retractions. Lungs CTAB. Gastrointestinal: Soft and nontender. No distention. No CVA tenderness. Musculoskeletal: No lower extremity tenderness nor edema.  No joint effusions. Sensation is intact to light touch to the bilateral lower extremities. Dorsalis pedis pulses present and equal to bilateral lower extremities. 5 out of 5 strength in left lower 70. However, the patient is unable to range the right lower extremity secondary to pain in the right hip. No tenderness palpation, effusion or deformity of the right knee. Neurologic:  Normal speech and language. No gross focal neurologic deficits are appreciated. Skin:  Skin is warm, dry and intact. No rash noted. Psychiatric: Mood and affect are normal. Speech and behavior are normal.  ____________________________________________   LABS (all labs ordered are listed, but only abnormal results are displayed)  Labs Reviewed  PROTIME-INR - Abnormal; Notable for the following:       Result Value   Prothrombin Time 23.0 (*)    All other components within normal limits  CBC WITH DIFFERENTIAL/PLATELET - Abnormal; Notable for the following:    RDW 15.3 (*)    Platelets 123 (*)    All other components within normal limits  BASIC METABOLIC PANEL - Abnormal; Notable for the following:    Glucose, Bld 152 (*)    BUN 44 (*)     Creatinine, Ser 1.80 (*)    GFR calc non Af Amer 23 (*)    GFR calc Af Amer 27 (*)    All other components within normal limits  APTT  TROPONIN I  URINALYSIS, COMPLETE (UACMP) WITH MICROSCOPIC  TYPE AND SCREEN   ____________________________________________  EKG  ED ECG REPORT I, Doran Stabler, the attending physician, personally viewed and interpreted this ECG.   Date: 05/07/2017  EKG Time: 1028  Rate: 83  Rhythm: normal EKG, normal sinus rhythm, unchanged from previous tracings, atrial fibrillation, rate 83  Axis: Normal  Intervals:left bundle branch block  ST&T Change: T wave inversions in aVL as well as biphasic T waves V5 and V6. Left bundle branch block is old as per the previous EKGs on the record. Similar appearance to previous EKGs on record.  ED ECG REPORT I, Doran Stabler, the attending physician, personally viewed and interpreted this ECG.   Date: 05/07/2017  EKG Time:1341  Rate: 101  Rhythm: atrial fibrillation, rate 101  Axis: Normal  Intervals:left bundle branch block  ST&T Change: T wave inversions in aVL as well as biphasic T waves in V5 and V6 were unchanged from previous.  ____________________________________________  RADIOLOGY  Initial CT without any acute process. Plain films without any acute process including no obvious fractures.  CT pelvis with 2 pubic ramus fractures and a hematoma measuring about 10 cm. ____________________________________________   PROCEDURES  Procedure(s) performed:   Procedures  Critical Care performed:   ____________________________________________   INITIAL IMPRESSION / ASSESSMENT AND PLAN / ED COURSE  Pertinent labs & imaging results that were available during my care of the patient were reviewed by me and considered in my medical decision making (see chart for details).  ----------------------------------------- 1:49 PM on 05/07/2017 -----------------------------------------  Patient with  increasing confusion per family. Now repeating herself which is not typical for her. Asking for water over it over again. Repeat CT head ordered.    ----------------------------------------- 4:04 PM on 05/07/2017 -----------------------------------------  I had a lengthy discussion with the patient's daughter regarding care of the patient. The patient is followed by hospice and has a DO NOT RESUSCITATE. The patient's daughter would prefer that the patient be observed before they make a decision about administration of the first lesions such as clotting factors/KCENTRA.  The patient has expressed a refusal previously for blood product administration. In summary, this patient had a syncopal episode resulting in a fall with pelvic fracture and extraperitoneal hematoma. The patient became confused in the emergency department and a repeat CT of the brain did not show bleeding. However, CT of the pelvis showed hematoma and pelvic fracture. Dr. Sabra Heck, the orthopedist is aware and does not see any surgical intervention at this time is indicated. The patient will be admitted to the medicine service. Social work following. Signed out to Dr. Margaretmary Eddy.  I discussed with the family that we will further observe the patient in transfer hemoglobin. The family is understanding that without KCENTRA the patient may continue to bleed which may result in death. Despite this, the patient's daughter still like to go without reversal agents and pursue a minimalist approach consistent with the patient's hospice status.  ____________________________________________   FINAL CLINICAL IMPRESSION(S) / ED DIAGNOSES  Final diagnoses:  Closed fracture of ramus of right pubis, initial encounter ([place])  Hematoma  Syncope and collapse      NEW MEDICATIONS STARTED DURING THIS VISIT:  New Prescriptions   No medications on file     Note:  This document was prepared using Dragon voice recognition software and may include  unintentional dictation errors.     Orbie Pyo, MD 05/07/17 [phone]

## 2017-05-08 DIAGNOSIS — I4891 Unspecified atrial fibrillation: Secondary | ICD-10-CM | POA: Diagnosis not present

## 2017-05-08 DIAGNOSIS — I10 Essential (primary) hypertension: Secondary | ICD-10-CM | POA: Diagnosis not present

## 2017-05-08 DIAGNOSIS — T148XXA Other injury of unspecified body region, initial encounter: Secondary | ICD-10-CM | POA: Diagnosis not present

## 2017-05-08 DIAGNOSIS — S329XXA Fracture of unspecified parts of lumbosacral spine and pelvis, initial encounter for closed fracture: Secondary | ICD-10-CM | POA: Diagnosis not present

## 2017-05-08 LAB — BASIC METABOLIC PANEL
Anion gap: 7 (ref 5–15)
BUN: 47 mg/dL — AB (ref 6–20)
CO2: 31 mmol/L (ref 22–32)
CREATININE: 1.85 mg/dL — AB (ref 0.44–1.00)
Calcium: 8.7 mg/dL — ABNORMAL LOW (ref 8.9–10.3)
Chloride: 102 mmol/L (ref 101–111)
GFR, EST AFRICAN AMERICAN: 26 mL/min — AB (ref >60)
GFR, EST NON AFRICAN AMERICAN: 22 mL/min — AB (ref >60)
Glucose, Bld: 113 mg/dL — ABNORMAL HIGH (ref 65–99)
POTASSIUM: 4.5 mmol/L (ref 3.5–5.1)
SODIUM: 140 mmol/L (ref 135–145)

## 2017-05-08 LAB — CBC
HCT: 28.2 % — ABNORMAL LOW (ref 35.0–47.0)
Hemoglobin: 9.6 g/dL — ABNORMAL LOW (ref 12.0–16.0)
MCH: 31 pg (ref 26.0–34.0)
MCHC: 33.9 g/dL (ref 32.0–36.0)
MCV: 91.4 fL (ref 80.0–100.0)
PLATELETS: 80 10*3/uL — AB (ref 150–440)
RBC: 3.09 MIL/uL — AB (ref 3.80–5.20)
RDW: 15.3 % — ABNORMAL HIGH (ref 11.5–14.5)
WBC: 9.1 10*3/uL (ref 3.6–11.0)

## 2017-05-08 MED ORDER — SODIUM CHLORIDE 0.9 % IV BOLUS (SEPSIS)
250.0000 mL | Freq: Once | INTRAVENOUS | Status: AC
  Administered 2017-05-08: 250 mL via INTRAVENOUS

## 2017-05-08 NOTE — Evaluation (Signed)
Physical Therapy Evaluation Patient Details Name: [name] MRN: [id number] DOB: 11-28-1921 Today's Date: 05/08/2017   History of Present Illness  pt is a 83 y.o F admitted on 05/07/2017 due a fall and fractures her superior-inferior pubic rami, She has a history of  Multiple medical problems including coronary artery disease chronic atrial fibrillation, chronic systolic CHF, dyslipidemia, essential hypertension, GERD, hypothyroidism. She currently resides at [facility] which is independent living.   Clinical Impression  Upon entering room pt is laying bed with family in the room. She reports currently no pain but states pain goes up to 7/10 with movement. She demonstrates weakness with pain during RLE assessment and mild weakness in the LLE. She requires max assist with bed mobility and supine to sit due to pain and weakness. Performed exercises in bed before activity.  Attempted standing with RW but pt's legs demonstrated significant shaking/ buckling, halted activity. Utilized squat pivot transfer into recliner requiring max assist. She reported no pain during or following activity. She would benefit from physical therapy to reduce pain, improve strength/ standing endurance, bed mobility, and maximize her function. Upon discharged she would benefit from continued physical therapy and increased assistance based on limited mobility and would benefit most from a SNF setting at this time for safety.     Follow Up Recommendations SNF    Equipment Recommendations  Rolling walker with 5" wheels    Recommendations for Other Services Rehab consult     Precautions / Restrictions Precautions Precautions: None Restrictions Weight Bearing Restrictions: No      Mobility  Bed Mobility Overal bed mobility: Needs Assistance Bed Mobility: Rolling;Supine to Sit Rolling: Max assist   Supine to sit: Max assist     General bed mobility comments: verbal cues to go slow and move one leg at a  time, while keep Head of the bed up   Transfers Overall transfer level: Needs assistance Equipment used: None (attempted RW but pt demonstrated increased LE buckling) Transfers: Squat Pivot Transfers     Squat pivot transfers: Max assist;+2 physical assistance (for safety and to reduce pain)     General transfer comment: max cueing for hand palcement on arms and rocking of the trunk to promote sit to stand  Ambulation/Gait             General Gait Details: pt did not amb during this session. she attmpted to but demonstrated increased LE buckling. halted attempt and removed walking using squat pivot to get to recliner  Stairs            Wheelchair Mobility    Modified Rankin (Stroke Patients Only)       Balance                                             Pertinent Vitals/Pain Pain Assessment: 0-10 Pain Score:  (currently 0/10, but with movement 7/10) Pain Location: R groin Pain Descriptors / Indicators: Sharp Pain Intervention(s): Monitored during session;Limited activity within patient's tolerance    Home Living Family/patient expects to be discharged to:: Private residence Living Arrangements: Alone Available Help at Discharge: Family;Available PRN/intermittently;Personal care attendant Type of Home: Independent living facility ([facility]) Home Access: Level entry     Home Layout: One level Home Equipment: Grab bars - tub/shower;Grab bars - toilet;Shower seat;Bedside commode;Walker - 4 wheels      Prior Function  Level of Independence: Independent with assistive device(s)         Comments: Ambulates with rollator.     Hand Dominance   Dominant Hand: Right    Extremity/Trunk Assessment   Upper Extremity Assessment Upper Extremity Assessment: Generalized weakness (3+/5)    Lower Extremity Assessment Lower Extremity Assessment: Generalized weakness (RLE 3-/5 with pain during testing, LLE 3+/5)       Communication    Communication: HOH  Cognition Arousal/Alertness: Awake/alert;Lethargic Behavior During Therapy: WFL for tasks assessed/performed Overall Cognitive Status: Within Functional Limits for tasks assessed                                        General Comments      Exercises Other Exercises Other Exercises: ankle pumps 2 x 10, quad set 1 x 10, heel slide 1 x 10 RLE only ( while laying in bed)   Assessment/Plan    PT Assessment Patient needs continued PT services  PT Problem List Decreased strength;Decreased activity tolerance;Decreased range of motion;Decreased mobility;Decreased balance;Decreased safety awareness;Pain;Decreased knowledge of precautions       PT Treatment Interventions Therapeutic activities;Therapeutic exercise;Functional mobility training;Stair training;DME instruction;Balance training;Neuromuscular re-education;Patient/family education;Gait training;Manual techniques    PT Goals (Current goals can be found in the Care Plan section)  Acute Rehab PT Goals Patient Stated Goal: to not have pain PT Goal Formulation: With patient/family Time For Goal Achievement: 06/05/17 Potential to Achieve Goals: Good    Frequency Min 2X/week   Barriers to discharge Decreased caregiver support      Co-evaluation               AM-PAC PT "6 Clicks" Daily Activity  Outcome Measure Difficulty turning over in bed (including adjusting bedclothes, sheets and blankets)?: Unable Difficulty moving from lying on back to sitting on the side of the bed? : Unable Difficulty sitting down on and standing up from a chair with arms (e.g., wheelchair, bedside commode, etc,.)?: Unable Help needed moving to and from a bed to chair (including a wheelchair)?: Total Help needed walking in hospital room?: Total Help needed climbing 3-5 steps with a railing? : Total 6 Click Score: 6    End of Session Equipment Utilized During Treatment: Gait belt Activity Tolerance: Patient  tolerated treatment well;No increased pain;Patient limited by fatigue;Patient limited by lethargy Patient left: in chair;with chair alarm set;with call bell/phone within reach;with family/visitor present Nurse Communication: Mobility status;Weight bearing status (how session went, and for safety/ pain to use +2) PT Visit Diagnosis: Muscle weakness (generalized) (M62.81);History of falling (Z91.81);Other abnormalities of gait and mobility (R26.89);Difficulty in walking, not elsewhere classified (R26.2);Adult, failure to thrive (R62.7);Pain Pain - Right/Left: Right Pain - part of body: Hip    Time: 1138-1208 PT Time Calculation (min) (ACUTE ONLY): 30 min   Charges:   PT Evaluation $PT Eval High Complexity: 1 High PT Treatments $Therapeutic Exercise: 23-37 mins   PT G Codes:   PT G-Codes **NOT FOR INPATIENT CLASS** Functional Assessment Tool Used: AM-PAC 6 Clicks Basic Mobility;Clinical judgement Functional Limitation: Mobility: Walking and moving around Mobility: Walking and Moving Around Current Status (H0623): At least 80 percent but less than 100 percent impaired, limited or restricted Mobility: Walking and Moving Around Goal Status [phone]): At least 60 percent but less than 80 percent impaired, limited or restricted    Miasha Emmons PT, DPT, LAT, ATC  05/08/17  1:04 PM  [place] 05/08/2017, 12:59 PM

## 2017-05-08 NOTE — Progress Notes (Signed)
[facility] NAME: [name]    MR#:  [id number]  DATE OF BIRTH:  04-15-1922  SUBJECTIVE:  Came with after fall at ALF Hip pain dter in the room REVIEW OF SYSTEMS:   Review of Systems  Constitutional: Negative for chills, fever and weight loss.  HENT: Negative for ear discharge, ear pain and nosebleeds.   Eyes: Negative for blurred vision, pain and discharge.  Respiratory: Negative for sputum production, shortness of breath, wheezing and stridor.   Cardiovascular: Negative for chest pain, palpitations, orthopnea and PND.  Gastrointestinal: Negative for abdominal pain, diarrhea, nausea and vomiting.  Genitourinary: Negative for frequency and urgency.  Musculoskeletal: Positive for back pain, falls and joint pain.  Neurological: Positive for weakness. Negative for sensory change, speech change and focal weakness.  Psychiatric/Behavioral: Negative for depression and hallucinations. The patient is not nervous/anxious.    Tolerating Diet:yes Tolerating PT: pending  DRUG ALLERGIES:   Allergies  Allergen Reactions  . Sulfonamide Derivatives Nausea And Vomiting    REACTION: sick  . Alendronate Sodium Other (See Comments)    REACTION: GI  . Prednisone Palpitations    VITALS:  Blood pressure (!) 99/45, pulse 83, temperature 98.1 F (36.7 C), temperature source Oral, resp. rate 16, height 5\' 7"  (1.702 m), weight 54.2 kg (119 lb 6.4 oz), SpO2 94 %.  PHYSICAL EXAMINATION:   Physical Exam  GENERAL:  83 y.o.-year-old patient lying in the bed with no acute distress. thin EYES: Pupils equal, round, reactive to light and accommodation. No scleral icterus. Extraocular muscles intact.  HEENT: Head atraumatic, normocephalic. Oropharynx and nasopharynx clear.  NECK:  Supple, no jugular venous distention. No thyroid enlargement, no tenderness.  LUNGS: Normal breath sounds bilaterally, no wheezing, rales, rhonchi. No use of  accessory muscles of respiration.  CARDIOVASCULAR: S1, S2 normal. No murmurs, rubs, or gallops.  ABDOMEN: Soft, nontender, nondistended. Bowel sounds present. No organomegaly or mass.  EXTREMITIES: No cyanosis, clubbing or edema b/l.    NEUROLOGIC: Cranial nerves II through XII are intact. No focal Motor or sensory deficits b/l.   PSYCHIATRIC:  patient is alert and oriented x 3.  SKIN: No obvious rash, lesion, or ulcer.   LABORATORY PANEL:  CBC  Recent Labs Lab 05/08/17 0442  WBC 9.1  HGB 9.6*  HCT 28.2*  PLT 80*    Chemistries   Recent Labs Lab 05/08/17 0442  NA 140  K 4.5  CL 102  CO2 31  GLUCOSE 113*  BUN 47*  CREATININE 1.85*  CALCIUM 8.7*   Cardiac Enzymes  Recent Labs Lab 05/07/17 1030  TROPONINI <0.03   RADIOLOGY:  Dg Chest 1 View  Result Date: 05/07/2017 CLINICAL DATA:  Fall, syncope, right hip pain EXAM: CHEST  1 VIEW COMPARISON:  04/11/2017 FINDINGS: Resolution of pleural effusions.  No pneumothorax. Cardiomegaly.  Coronary stent.  Atheromatous aorta. Coarse interstitial opacities throughout both lungs with resolution of the interstitial edema seen previously. Previous lower thoracic vertebral augmentation. IMPRESSION: 1. Stable cardiomegaly with no acute findings. 2. Resolution pleural effusions and interstitial edema since previous exam. Electronically Signed   By: Lucrezia Europe M.D.   On: 05/07/2017 11:43   Ct Head Wo Contrast  Result Date: 05/07/2017 CLINICAL DATA:  Fall, confusion EXAM: CT HEAD WITHOUT CONTRAST TECHNIQUE: Contiguous axial images were obtained from the base of the skull through the vertex without intravenous contrast. COMPARISON:  05/07/2017 at 1100 hours FINDINGS: Motion degraded images. Brain: No evidence of acute  infarction, hemorrhage, hydrocephalus, extra-axial collection or mass lesion/mass effect. Subcortical white matter and periventricular small vessel ischemic changes. Vascular: Intracranial atherosclerosis. Skull: Normal. Negative  for fracture or focal lesion. Sinuses/Orbits: The visualized paranasal sinuses are essentially clear. The mastoid air cells are unopacified. Other: None. IMPRESSION: Motion degraded images. No evidence of acute intracranial abnormality. Small vessel ischemic changes. No interval change. Electronically Signed   By: Julian Hy M.D.   On: 05/07/2017 13:10   Ct Head Wo Contrast  Result Date: 05/07/2017 CLINICAL DATA:  Per EMS pt comes [facility]. Pt was using walker and became dizzy and fell. Pt does not remember passing out. EXAM: CT HEAD WITHOUT CONTRAST TECHNIQUE: Contiguous axial images were obtained from the base of the skull through the vertex without intravenous contrast. COMPARISON:  04/07/2015 FINDINGS: Brain: Diffuse parenchymal atrophy. Stable early bilateral basal ganglia mineralization. Patchy areas of hypoattenuation in deep and periventricular white matter bilaterally. Negative for acute intracranial hemorrhage, mass lesion, acute infarction, midline shift, or mass-effect. Acute infarct may be inapparent on noncontrast CT. Ventricles and sulci symmetric. Vascular: Atherosclerotic and physiologic intracranial calcifications. Skull: Normal. Negative for fracture or focal lesion. Sinuses/Orbits: Fluid level in the sphenoid sinus on the right. Remainder of visualized paranasal sinuses and mastoid air cells appear normally developed and well aerated. Other: None. IMPRESSION: 1. Negative for bleed or other acute intracranial process. 2. Atrophy and nonspecific white matter changes. 3. Sphenoid sinus disease. Electronically Signed   By: Lucrezia Europe M.D.   On: 05/07/2017 11:49   Ct Pelvis Wo Contrast  Result Date: 05/07/2017 CLINICAL DATA:  Pt with fall this am. Pt with continued pelvic pain. Per pt family pt with increased confusion over the last hour. MD aware of CT scan from earlier. MD wants repeated scan. EXAM: CT PELVIS WITHOUT CONTRAST TECHNIQUE: Multidetector CT imaging of the pelvis  was performed following the standard protocol without intravenous contrast. COMPARISON:  None available FINDINGS: Urinary Tract: Urinary bladder is incompletely distended, with some mass effect from extraperitoneal hematoma anteriorly on the right. Bowel: Visualized portions of small bowel and colon are nondilated, without wall thickening or adjacent inflammatory/edematous change. Vascular/Lymphatic: Extensive aortoiliac calcified plaque without aneurysm. Reproductive:  No mass or other significant abnormality Other: 10.1 x 4.6 cm extraperitoneal hematoma centered just cephalad to the right pubic symphysis related to acute fracture. Musculoskeletal: Comminuted fracture of superior right pubic ramus, and the inferior right pubic ramus. Minimally displaced fracture of the right sacral ala. No hip fracture or dislocation. Advanced degenerative disc disease L5-S1. IMPRESSION: 1. Right comminuted superior and inferior pubic ramus fractures with adjacent 10.1 cm extraperitoneal hematoma. 2. Minimally displaced right sacral ala fracture. 3. Aortoiliac atherosclerosis  (ICD10-170.0) Electronically Signed   By: Lucrezia Europe M.D.   On: 05/07/2017 13:19   Dg Hip Unilat W Or Wo Pelvis 2-3 Views Right  Result Date: 05/07/2017 CLINICAL DATA:  Patient fell this morning and now has pain associated with the left hip. She also reportedly hit her head according to her daughter. EXAM: DG HIP (WITH OR WITHOUT PELVIS) 2-3V RIGHT COMPARISON:  12/10/2013 FINDINGS: Diffuse osteopenia.  No dislocation.  No definite fracture. Extensive iliofemoral arterial calcifications. Bilateral pelvic phleboliths. IMPRESSION: 1. Osteopenia with no definite fracture or other acute findings. 2. Ilio femoral atherosclerosis. Electronically Signed   By: Lucrezia Europe M.D.   On: 05/07/2017 11:46   ASSESSMENT AND PLAN:  Danicia Terhaar  is a 83 y.o. female with a known history of  Multiple medical problems including  coronary artery disease chronic atrial  fibrillation, chronic systolic CHF, dyslipidemia, essential hypertension, GERD, hypothyroidism who was hospitalized for 9 days in June.Patient was trying to ambulate and fell today. She was brought to the emergency room and is having difficulty with ambulation.  1.Right comminuted superior and inferior pubic ramus fractures with 10.1 cm extraperitoneal hematoma s/p fall -Stop anticoagulation -Monitor hemoglobin 12--9.6 -Ortho consult with Dr Gardiner Coins management  -start PT and d/c planning  2. Atrial fibrillation Continue metoprolol Anticoagulation will be on hold  3. Relative hypotension  -continue midrodrine -hold bp mes  4. Hypothyroidism continue Synthroid  5. GERD continue Protonix  6. Code DO NOT RESUSCITATE  -pt is [facility] by hospice at [facility]  Case discussed with Care Management/Social Worker. Management plans discussed with the patient, family and they are in agreement.  CODE STATUS: DNR  DVT Prophylaxis: No anticoagulation due to Hematoma  TOTAL TIME TAKING CARE OF THIS PATIENT: *30* minutes.  >50% time spent on counselling and coordination of care  POSSIBLE D/C IN *1-2* DAYS, DEPENDING ON CLINICAL CONDITION.  Note: This dictation was prepared with Dragon dictation along with smaller phrase technology. Any transcriptional errors that result from this process are unintentional.  Kamauri Kathol M.D on 05/08/2017 at 3:27 PM  Between 7am to 6pm - Pager - [phone]  After 6pm go to [email or web address] - password [email or web address] [place] [facility]  Office  [phone]  CC: Primary care physician; Tower, Wynelle Fanny, MD

## 2017-05-08 NOTE — Plan of Care (Signed)
Problem: Safety: Goal: Ability to remain free from injury will improve Outcome: Progressing Pt able to remain free from falls this shift. Bed alarm armed.  Problem: Pain Managment: Goal: General experience of comfort will improve Outcome: Progressing No complaints of pain this shift.  Problem: Activity: Goal: Risk for activity intolerance will decrease Outcome: Not Progressing Pt not able to ambulate at this time.

## 2017-05-08 NOTE — Progress Notes (Signed)
Pt remaining alert this shift. NO complaints of pain this shift. Inconant of urine. Pt not able to ambulate at this time. Pt has been able to sleep in between care.

## 2017-05-08 NOTE — Clinical Social Work Note (Signed)
CSW is aware of this patient and is waiting for PT recommendation. CSW will follow should PT recommend STR.   Santiago Bumpers, MSW, Latanya Presser [phone]

## 2017-05-08 NOTE — Progress Notes (Signed)
BP 76/40. Patient asymptomatic. Notified Dr Posey Pronto. Ordered bolus of 229mL and hold BP meds. Only give Midodrine.

## 2017-05-08 NOTE — Progress Notes (Signed)
Pt BP 89/40. MD paged.

## 2017-05-08 NOTE — Care Management Note (Addendum)
Case Management Note  Patient Details  Name: [name] MRN: [id number] Date of Birth: 20-Jun-1922  Subjective/Objective:   Discussed this admission with Lorelle Formosa at [facility] who provides home hospice services for [name], a resident of [place]. Flo Shanks from Hospice will see [name] on Monday.                   Action/Plan:   Expected Discharge Date:                  Expected Discharge Plan:     In-House Referral:     Discharge planning Services     Post Acute Care Choice:    Choice offered to:     DME Arranged:    DME Agency:     HH Arranged:    HH Agency:     Status of Service:     If discussed at [employer] of Stay Meetings, dates discussed:    Additional Comments:  Raymund Manrique A, RN 05/08/2017, 8:55 AM

## 2017-05-08 NOTE — Progress Notes (Signed)
Subjective:   Moderate pain.  Alert and oriented.      Patient reports pain as moderate.  Objective:  Sitting up in bed.  CSM good right leg. No visible bruising on right.  Hip motion good.   VITALS:   Vitals:   05/08/17 0720 05/08/17 1039  BP: (!) 99/51 (!) 89/40  Pulse: 98   Resp: (!) 24   Temp: 97.7 F (36.5 C)   SpO2: 97%     Neurologically intact ABD soft Neurovascular intact Sensation intact distally Intact pulses distally Dorsiflexion/Plantar flexion intact  LABS  Recent Labs  05/07/17 1030 05/08/17 0442  HGB 12.2 9.6*  HCT 35.5 28.2*  WBC 7.4 9.1  PLT 123* 80*     Recent Labs  05/07/17 1030 05/08/17 0442  NA 143 140  K 4.5 4.5  BUN 44* 47*  CREATININE 1.80* 1.85*  GLUCOSE 152* 113*     Recent Labs  05/07/17 1030  INR 2.00     Assessment/Plan:      Advance diet Up with therapy Discharge to SNF when stable  Start PT  PWB on right  RTC 2 weeks

## 2017-05-08 NOTE — Care Management Obs Status (Signed)
[place] NOTIFICATION   Patient Details  Name: [name] MRN: [id number] Date of Birth: 10/13/1921   Medicare Observation Status Notification Given:  Yes    Anslie Spadafora A, RN 05/08/2017, 2:47 PM

## 2017-05-09 DIAGNOSIS — I10 Essential (primary) hypertension: Secondary | ICD-10-CM | POA: Diagnosis not present

## 2017-05-09 DIAGNOSIS — S329XXA Fracture of unspecified parts of lumbosacral spine and pelvis, initial encounter for closed fracture: Secondary | ICD-10-CM | POA: Diagnosis not present

## 2017-05-09 DIAGNOSIS — I4891 Unspecified atrial fibrillation: Secondary | ICD-10-CM | POA: Diagnosis not present

## 2017-05-09 DIAGNOSIS — T148XXA Other injury of unspecified body region, initial encounter: Secondary | ICD-10-CM | POA: Diagnosis not present

## 2017-05-09 LAB — HEMOGLOBIN: HEMOGLOBIN: 9 g/dL — AB (ref 12.0–16.0)

## 2017-05-09 MED ORDER — MIRTAZAPINE 15 MG PO TABS
15.0000 mg | ORAL_TABLET | Freq: Every day | ORAL | Status: DC
  Administered 2017-05-09: 15 mg via ORAL
  Filled 2017-05-09: qty 1

## 2017-05-09 NOTE — NC FL2 (Signed)
[place] LEVEL OF CARE SCREENING TOOL     IDENTIFICATION  Patient Name: [name] Birthdate: 1922/05/21 Sex: female Admission Date (Current Location): 05/07/2017  [place] and [place] Number:  Engineering geologist and Address:  [facility], [address], [place], [place] [address]      Provider Number: [id number]  Attending Physician Name and Address:  Fritzi Mandes, MD  Relative Name and Phone Number:       Current Level of Care: Hospital Recommended Level of Care: [place]  Prior Approval Number:    Date Approved/Denied:   PASRR Number:   [id number]  Discharge Plan: Skilled Nursing Facility     Current Diagnoses: Patient Active Problem List   Diagnosis Date Noted  . Pelvic fracture ([place]) 05/07/2017  . COPD (chronic obstructive pulmonary disease) ([place]) 05/03/2017  . Palliative care encounter   . Acute respiratory distress 04/11/2017  . AKI (acute kidney injury) ([place]) 04/11/2017  . Hypomagnesemia 04/11/2017  . Pressure injury of skin 04/08/2017  . Cough 04/07/2017  . Wheezing 04/07/2017  . H/O: pneumonia 04/07/2017  . Muscle weakness (generalized)   . Unsteadiness on feet   . Influenza A   . Encounter for therapeutic drug monitoring 08/24/2016  . Long-term (current) use of anticoagulants 08/24/2016  . Arterial hypotension   . Adjustment disorder with mixed anxiety and depressed mood 06/09/2015  . Generalized weakness 05/12/2015  . Chronic systolic CHF (congestive heart failure) ([place]) 04/29/2015  . Cardiomyopathy, ischemic   . Pulmonary hypertension ([place])   . Adult failure to thrive   . Chronic atrial fibrillation ([place])   . Unstable angina ([place]) 04/07/2015  . Venous stasis dermatitis 06/19/2014  . Orthostatic hypotension   . Acute on chronic combined systolic (congestive) and diastolic (congestive) heart failure ([place]) 03/02/2013  . History of shingles 02/28/2013  . Nail fungus 01/10/2013  .  Hyperlipidemia 07/09/2011  . Hearing loss 07/09/2011  . Weight loss   . A-fib ([place])   . CAD in native artery   . Mitral regurgitation   . LBBB (left bundle branch block)   . Warfarin anticoagulation   . TR (tricuspid regurgitation)   . Dyslipidemia   . Lung abnormality   . Prolonged QT interval   . Coccydynia 12/03/2010  . Nonspecific (abnormal) findings on radiological and other examination of body structure 07/13/2010  . ABNORMAL CHEST XRAY 07/13/2010  . Shortness of breath 06/18/2010  . Essential hypertension 06/24/2009  . GOUT, UNSPECIFIED 03/20/2009  . GERD 03/18/2009  . Thrombocytopenia ([place]) 01/03/2009  . ALLERGIC RHINITIS 12/05/2008  . HOARSENESS 06/07/2008  . MEDIASTINAL LYMPHADENOPATHY 05/13/2008  . Other diseases of lung, not elsewhere classified 04/18/2008  . Venous (peripheral) insufficiency 09/13/2007  . FREQUENCY, URINARY 07/19/2007  . TINNITUS NOS 05/11/2007  . OSTEOPOROSIS NOS 05/11/2007  . HYPOTHYROIDISM 04/11/2007  . ANEMIA, B12 DEFICIENCY 04/11/2007    Orientation RESPIRATION BLADDER Height & Weight     Self, Time, Situation, Place  Normal Incontinent Weight: 118 lb 1.6 oz (53.6 kg) Height:  5\' 7"  (170.2 cm)  BEHAVIORAL SYMPTOMS/MOOD NEUROLOGICAL BOWEL NUTRITION STATUS      Incontinent Diet (Normal)  AMBULATORY STATUS COMMUNICATION OF NEEDS Skin   Extensive Assistance  Verbally Normal                       Personal Care Assistance Level of Assistance  Bathing, Feeding, Dressing, Total care Bathing Assistance: Limited assistance Feeding assistance: Limited assistance Dressing Assistance: Limited  assistance Total Care Assistance: Limited assistance   Functional Limitations Info  Sight, Hearing, Speech Sight Info: Adequate Hearing Info: Adequate (Hard of hearing raise your voice) Speech Info: Adequate    SPECIAL CARE FACTORS FREQUENCY   PT and OT   5 days per week                   Contractures Contractures Info: Not present     Additional Factors Info  Allergies   Allergies Info: Sulfonamide derivatives,  alendronate sodium , Prednisone            Current Medications (05/09/2017):  This is the current hospital active medication list Current Facility-Administered Medications  Medication Dose Route Frequency Provider Last Rate Last Dose  . acetaminophen (TYLENOL) tablet 650 mg  650 mg Oral Q6H PRN Dustin Flock, MD   650 mg at 05/09/17 1607   Or  . acetaminophen (TYLENOL) suppository 650 mg  650 mg Rectal Q6H PRN Dustin Flock, MD      . calcium carbonate (TUMS - dosed in mg elemental calcium) chewable tablet 500 mg of elemental calcium  2.5 tablet Oral QPM Dustin Flock, MD      . cyanocobalamin ((VITAMIN B-12)) injection 1,000 mcg  1,000 mcg Intramuscular Q30 days Dustin Flock, MD   1,000 mcg at 05/08/17 1424  . docusate sodium (COLACE) capsule 100 mg  100 mg Oral QHS Dustin Flock, MD   100 mg at 05/08/17 2118  . fluticasone (FLONASE) 50 MCG/ACT nasal spray 2 spray  2 spray Each Nare Daily Dustin Flock, MD   2 spray at 05/09/17 0902  . guaiFENesin-dextromethorphan (ROBITUSSIN DM) 100-10 MG/5ML syrup 5 mL  5 mL Oral Q4H PRN Dustin Flock, MD      . HYDROcodone-acetaminophen (NORCO/VICODIN) 5-325 MG per tablet 1-2 tablet  1-2 tablet Oral Q4H PRN Dustin Flock, MD      . levothyroxine (SYNTHROID, LEVOTHROID) tablet 88 mcg  88 mcg Oral QAC breakfast Dustin Flock, MD   88 mcg at 05/09/17 0840  . metoprolol tartrate (LOPRESSOR) tablet 12.5 mg  12.5 mg Oral BID Dustin Flock, MD   Stopped at 05/08/17 2119  . midodrine (PROAMATINE) tablet 5 mg  5 mg Oral BID WC Dustin Flock, MD   5 mg at 05/09/17 0840  . morphine 2 MG/ML injection 2 mg  2 mg Intravenous Q4H PRN Dustin Flock, MD      . multivitamin with minerals tablet 1 tablet  1 tablet Oral Daily Dustin Flock, MD   1 tablet at 05/09/17 0902  . multivitamin-lutein (OCUVITE-LUTEIN) capsule 1 capsule  1 capsule Oral Daily Dustin Flock, MD   1 capsule at 05/09/17 0902  . ondansetron (ZOFRAN) tablet 4 mg  4 mg Oral Q6H PRN Dustin Flock, MD       Or  . ondansetron (ZOFRAN) injection 4 mg  4 mg Intravenous Q6H PRN Dustin Flock, MD      . pantoprazole (PROTONIX) EC tablet 40 mg  40 mg Oral QAC breakfast Dustin Flock, MD   40 mg at 05/09/17 0840  . simvastatin (ZOCOR) tablet 10 mg  10 mg Oral QPM Dustin Flock, MD   10 mg at 05/08/17 1805     Discharge Medications: Please see discharge summary for a list of discharge medications.  Relevant Imaging Results:  Relevant Lab Results:   Additional Information SSN:  [id number]  Malkie Wille, Veronia Beets, LCSW

## 2017-05-09 NOTE — Progress Notes (Signed)
Clinical Education officer, museum (CSW) contacted patient's daughter [name] to present SNF bed offers and explain options. CSW explained to daughter that patient will have to stop hospice services with [facility]/ [facility] in order to go to SNF under [employer] insurance. Per daughter patient was recently at [facility] for rehab and discharged on 05/04/17 back to [facility] independent living and started hospice services with [facility]/ [facility]. Per daughter patient used 21 days at [facility]. CSW explained to daughter that patient did not have a 60 day wellness period so she would be in her co-pay days at [facility]. Daughter verbalized her understanding and asked to meet with Santiago Glad [place] hospice liaison.   Santiago Glad with hospice met with patient's daughter [name]. Per Santiago Glad daughter has decided to stop hospice and go back to [facility] for rehab. CSW contacted [facility] admissions coordinator at [facility] and made her aware of above. CSW contacted [facility] Team case manager and made her aware of above. Per Santiago Glad when the patient started hospice then traditional medicare becomes the payer and [employer] is no longer the payer. CSW explained to Santiago Glad that patient's daughter wants to keep [employer] and go back to [facility] under [employer] and stop hospice. Per Santiago Glad with [employer] she will speak to her supervisors and and get back to [place].   CSW met with patient's daughter Despina Pole and made her aware of what Santiago Glad from [employer] stated. CSW explained to daughter that if patient is now under traditional medicare she is under observation and would require a 3 night qualifying inpatient stay at the hospital in order for medicare to pay for SNF. Per daughter she can't pay privately for SNF. Daughter stated that the best plan would be for patient to D/C to [facility] for rehab under [employer] and stop hospice. Per daughter if traditional medicare is the payer then she will take patient home to her  house and continue [facility]/ [facility] hospice services. CSW will continue to follow and assist as needed.   [employer], LCSW [phone]

## 2017-05-09 NOTE — Progress Notes (Signed)
[facility] [place] NAME: [name]    MR#:  [id number]  DATE OF BIRTH:  [birth year]  SUBJECTIVE:  Came with after fall at ALF Hip pain dter in the room REVIEW OF SYSTEMS:   Review of Systems  Constitutional: Negative for chills, fever and weight loss.  HENT: Negative for ear discharge, ear pain and nosebleeds.   Eyes: Negative for blurred vision, pain and discharge.  Respiratory: Negative for sputum production, shortness of breath, wheezing and stridor.   Cardiovascular: Negative for chest pain, palpitations, orthopnea and PND.  Gastrointestinal: Negative for abdominal pain, diarrhea, nausea and vomiting.  Genitourinary: Negative for frequency and urgency.  Musculoskeletal: Positive for back pain, falls and joint pain.  Neurological: Positive for weakness. Negative for sensory change, speech change and focal weakness.  Psychiatric/Behavioral: Negative for depression and hallucinations. The patient is not nervous/anxious.    Tolerating Diet:yes Tolerating PT: Rehab  DRUG ALLERGIES:   Allergies  Allergen Reactions  . Sulfonamide Derivatives Nausea And Vomiting    REACTION: sick  . Alendronate Sodium Other (See Comments)    REACTION: GI  . Prednisone Palpitations    VITALS:  Blood pressure (!) 110/53, pulse 86, temperature 98.2 F (36.8 C), temperature source Oral, resp. rate 20, height 5\' 7"  (1.702 m), weight 53.6 kg (118 lb 1.6 oz), SpO2 90 %.  PHYSICAL EXAMINATION:   Physical Exam  GENERAL:  83 y.o.-year-old patient lying in the bed with no acute distress. thin EYES: Pupils equal, round, reactive to light and accommodation. No scleral icterus. Extraocular muscles intact.  HEENT: Head atraumatic, normocephalic. Oropharynx and nasopharynx clear.  NECK:  Supple, no jugular venous distention. No thyroid enlargement, no tenderness.  LUNGS: Normal breath sounds bilaterally, no wheezing, rales, rhonchi. No use of  accessory muscles of respiration.  CARDIOVASCULAR: S1, S2 normal. No murmurs, rubs, or gallops.  ABDOMEN: Soft, nontender, nondistended. Bowel sounds present. No organomegaly or mass.  EXTREMITIES: No cyanosis, clubbing or edema b/l.    NEUROLOGIC: Cranial nerves II through XII are intact. No focal Motor or sensory deficits b/l.   PSYCHIATRIC:  patient is alert and oriented x 3.  SKIN: No obvious rash, lesion, or ulcer.   LABORATORY PANEL:  CBC  Recent Labs Lab 05/08/17 0442 05/09/17 0326  WBC 9.1  --   HGB 9.6* 9.0*  HCT 28.2*  --   PLT 80*  --     Chemistries   Recent Labs Lab 05/08/17 0442  NA 140  K 4.5  CL 102  CO2 31  GLUCOSE 113*  BUN 47*  CREATININE 1.85*  CALCIUM 8.7*   Cardiac Enzymes  Recent Labs Lab 05/07/17 1030  TROPONINI <0.03   RADIOLOGY:  No results found. ASSESSMENT AND PLAN:  [name]  is a 83 y.o. female with a known history of  Multiple medical problems including coronary artery disease chronic atrial fibrillation, chronic systolic CHF, dyslipidemia, essential hypertension, GERD, hypothyroidism who was hospitalized for 9 days in June.Patient was trying to ambulate and fell today. She was brought to the emergency room and is having difficulty with ambulation.  1.Right comminuted superior and inferior pubic ramus fractures with 10.1 cm extraperitoneal hematoma s/p fall -Stop anticoagulation -Monitor hemoglobin 12--9.6 -Ortho consult with Dr Gardiner Coins management  -PT recommends rehab  2. Atrial fibrillation Continue metoprolol Anticoagulation will be on hold  3. Relative hypotension  -continue midrodrine -improved after small bolus  4. Hypothyroidism continue Synthroid  5. GERD continue Protonix  6. Code DO NOT RESUSCITATE  -pt is followed by hosp[facility] at [facility]  Case discussed with Care Management/Social Worker. Management plans discussed with the patient, family and they are in agreement.  CODE  STATUS: DNR  DVT Prophylaxis: No anticoagulation due to Hematoma  TOTAL TIME TAKING CARE OF THIS PATIENT: *30* minutes.  >50% time spent on counselling and coordination of care  POSSIBLE D/C IN *1-2* DAYS, DEPENDING ON CLINICAL CONDITION.  Note: This dictation was prepared with Dragon dictation along with smaller phrase technology. Any transcriptional errors that result from this process are unintentional.  Allesandra Huebsch M.D on 05/09/2017 at 2:10 PM  Between 7am to 6pm - Pager - [phone]  After 6pm go to [email or web address] - password [email or web address] [place]  Office  [phone]  CC: Primary care physician; Tower, Wynelle Fanny, MD

## 2017-05-09 NOTE — Progress Notes (Signed)
Per [facility] Team case manager she was not able to verify what patient's payer source is [facility] Team or traditional Medicare). Per Santiago Glad she can't guarantee a payer for SNF at this time. Clinical Education officer, museum (CSW) met with patient's daughter [name] and made her aware of above. CSW explained private pay SNF options. Per daughter patient can't afford private pay and will have to D/C back to [facility] independent living and continue hospice services. Santiago Glad hospice liaison is aware of above and working on getting patient a hospital bed. CSW will continue to follow and assist as needed.   [employer], LCSW [phone]

## 2017-05-09 NOTE — Progress Notes (Signed)
Updated by CSW [facility] that patient is unable to discharge to SNF for rehab and will be returning to [facility] [place] to continue hospice services. Writer visited with patient and her daughter [name] to discuss discharge needs. [name] has requested a hospital bed. This has been ordered and plan is for delivery tomorrow before noon. Patient will discharge AFTER bed is in place. She will require EMS transport. CSW [place] aware. Hospice team updated. Will continue to follow through discharge. Thank you. Flo Shanks RN, BSN, [place] [facility] [place], [facility] [phone] c

## 2017-05-09 NOTE — Progress Notes (Signed)
Subjective: Less pain today.  OOB in chair.  hgb stabilizing       Patient reports pain as moderate.  Objective:   VITALS:   Vitals:   05/09/17 1046 05/09/17 1128  BP:  (!) 110/53  Pulse: 84 86  Resp:    Temp:    SpO2: 90%     Neurologically intact ABD soft Neurovascular intact Sensation intact distally Intact pulses distally Dorsiflexion/Plantar flexion intact  LABS  Recent Labs  05/07/17 1030 05/08/17 0442 05/09/17 0326  HGB 12.2 9.6* 9.0*  HCT 35.5 28.2*  --   WBC 7.4 9.1  --   PLT 123* 80*  --      Recent Labs  05/07/17 1030 05/08/17 0442  NA 143 140  K 4.5 4.5  BUN 44* 47*  CREATININE 1.80* 1.85*  GLUCOSE 152* 113*     Recent Labs  05/07/17 1030  INR 2.00     Assessment/Plan:      Advance diet Up with therapy D/C IV fluids Discharge to SNF

## 2017-05-09 NOTE — Progress Notes (Signed)
Physical Therapy Treatment Patient Details Name: [name] MRN: [id number] DOB: 09/21/1921 Today's Date: 05/09/2017    History of Present Illness pt is a 83 y.o F admitted on 05/07/2017 due a fall and fractures her superior-inferior pubic rami, She has a history of  Multiple medical problems including coronary artery disease chronic atrial fibrillation, chronic systolic CHF, dyslipidemia, essential hypertension, GERD, hypothyroidism. She currently resides at [facility] which is independent living.     PT Comments    Pt agreeable to PT; reports little to no pain at rest and increased minimally with exercises and to 4/10 with weigth bearing. Pt participates well with supine/seated exercises with more limited range/strength on R. Progressing to Min A with bed mobility and transfers with increased time and cues for sequencing. BP checked prior to out of bed, 110/53 and asymptomatic. Decreased weight acceptance through Right lower extremity requiring pivot of Left lower extremity for gait. Minimal clearance of Right lower extremity due to weakness/pain on R. Pt without loss of balance throughout transfer. Receives up in chair comfortably. Continue PT to progress range, strength and out of bed tolerance to improve functional mobility.   Follow Up Recommendations  SNF     Equipment Recommendations  Rolling walker with 5" wheels    Recommendations for Other Services       Precautions / Restrictions Precautions Precautions: None Restrictions Weight Bearing Restrictions: No    Mobility  Bed Mobility Overal bed mobility: Needs Assistance Bed Mobility: Supine to Sit Rolling: Min assist         General bed mobility comments: Cues for sequence, demonstrates ability to bridge to edge with assist for RLE and trunk. Increased time  Transfers Overall transfer level: Needs assistance Equipment used: Rolling walker (2 wheeled) Transfers: Sit to/from Stand Sit to Stand: Min assist          General transfer comment: BP checked prior to stand 110/53 asypmtomatic. Slow to rise, mild increased pain from sit. Attains full upright postue and maintains with Min guard  Ambulation/Gait Ambulation/Gait assistance: Min assist Ambulation Distance (Feet): 2 Feet Assistive device: Rolling walker (2 wheeled) Gait Pattern/deviations: Decreased dorsiflexion - right;Decreased dorsiflexion - left;Decreased step length - right;Decreased step length - left;Decreased weight shift to right;Antalgic Gait velocity: slow Gait velocity interpretation: <1.8 ft/sec, indicative of risk for recurrent falls General Gait Details: Pivots L foot due to decreased ability to shift weight to R. Minimal clearance of R due to weakness with lifting RLE, but greater than L   Stairs            Wheelchair Mobility    Modified Rankin (Stroke Patients Only)       Balance Overall balance assessment: Needs assistance Sitting-balance support: Feet supported;No upper extremity supported Sitting balance-Leahy Scale: Fair     Standing balance support: Bilateral upper extremity supported Standing balance-Leahy Scale: Fair                              Cognition Arousal/Alertness: Awake/alert Behavior During Therapy: WFL for tasks assessed/performed Overall Cognitive Status: Within Functional Limits for tasks assessed                                        Exercises General Exercises - Lower Extremity Ankle Circles/Pumps: AROM;Both;20 reps;Supine Quad Sets: Strengthening;Both;Supine;15 reps Gluteal Sets: Strengthening;Both;Supine;15 reps Short Arc Quad: AROM;Both;Supine;15 reps  Long Arc Quad: AROM;AAROM;Both;10 reps;Seated (partial range actively; assist for improved range) Heel Slides: Supine;AAROM;Both;15 reps (greater ability/range on L) Hip ABduction/ADduction: AAROM;Both;Supine;15 reps Hip Flexion/Marching: AAROM;Right;10 reps;Seated (AROM on L)    General  Comments        Pertinent Vitals/Pain Pain Assessment: 0-10 Pain Score: 4  (With movement) Pain Location: R groin Pain Intervention(s): Monitored during session;Repositioned    Home Living                      Prior Function            PT Goals (current goals can now be found in the care plan section) Progress towards PT goals: Progressing toward goals    Frequency    Min 2X/week      PT Plan Current plan remains appropriate    Co-evaluation              AM-PAC PT "6 Clicks" Daily Activity  Outcome Measure  Difficulty turning over in bed (including adjusting bedclothes, sheets and blankets)?: Unable Difficulty moving from lying on back to sitting on the side of the bed? : Unable Difficulty sitting down on and standing up from a chair with arms (e.g., wheelchair, bedside commode, etc,.)?: Unable Help needed moving to and from a bed to chair (including a wheelchair)?: A Little Help needed walking in hospital room?: A Lot Help needed climbing 3-5 steps with a railing? : Total 6 Click Score: 9    End of Session Equipment Utilized During Treatment: Gait belt Activity Tolerance: Patient tolerated treatment well;Other (comment) (with increased time) Patient left: in chair;with call bell/phone within reach;with chair alarm set;with family/visitor present   PT Visit Diagnosis: Muscle weakness (generalized) (M62.81);History of falling (Z91.81);Other abnormalities of gait and mobility (R26.89);Difficulty in walking, not elsewhere classified (R26.2);Adult, failure to thrive (R62.7);Pain Pain - Right/Left: Right Pain - part of body: Hip     Time: 5643-3295 PT Time Calculation (min) (ACUTE ONLY): 41 min  Charges:  $Gait Training: 8-22 mins $Therapeutic Exercise: 8-22 mins $Therapeutic Activity: 8-22 mins                    G Codes:        Larae Grooms, PTA 05/09/2017, 12:30 PM

## 2017-05-09 NOTE — Progress Notes (Signed)
Visit made. Patient is currently followed by [facility] [place]  with a hospice diagnosis of CHF, combined diastolic and systolic. She is a DNR code, with out of facility DNR in place. Patient was sent to the [facility] ED following a fall and was subsequently admitted for a pelvic fracture. Patient seen lying in bed, eyes closed, easily awakened to voice, alert an oriented. Daughter [name] at bedside. Long discussion had regarding discharge options. Going to rehab vs continuing hospice services at [place] with 24 hr care. Patient herself said she "wanted to walk again" and would like to try rehab. Much emotional support given to [place].Phylilis stated her understanding of the revocation of hospice benefit to pursue rehab. SW Mel Almond updated and will proceed with referral for rehab at [facility]. Hospice team updated. Will continue to follow through final disposition. Flo Shanks RN, BSN, [facility] [place], hospital liaison [phone]

## 2017-05-10 DIAGNOSIS — I10 Essential (primary) hypertension: Secondary | ICD-10-CM | POA: Diagnosis not present

## 2017-05-10 DIAGNOSIS — S329XXA Fracture of unspecified parts of lumbosacral spine and pelvis, initial encounter for closed fracture: Secondary | ICD-10-CM | POA: Diagnosis not present

## 2017-05-10 DIAGNOSIS — T148XXA Other injury of unspecified body region, initial encounter: Secondary | ICD-10-CM | POA: Diagnosis not present

## 2017-05-10 DIAGNOSIS — I4891 Unspecified atrial fibrillation: Secondary | ICD-10-CM | POA: Diagnosis not present

## 2017-05-10 MED ORDER — POLYETHYLENE GLYCOL 3350 17 G PO PACK
17.0000 g | PACK | Freq: Every day | ORAL | Status: DC
  Administered 2017-05-10: 17 g via ORAL
  Filled 2017-05-10: qty 1

## 2017-05-10 MED ORDER — POLYETHYLENE GLYCOL 3350 17 G PO PACK: 17.0000 g | PACK | Freq: Every day | ORAL | 0 refills | Status: DC

## 2017-05-10 MED ORDER — HYDROCODONE-ACETAMINOPHEN 5-325 MG PO TABS: 1.0000 | ORAL_TABLET | Freq: Four times a day (QID) | ORAL | 0 refills | Status: DC | PRN

## 2017-05-10 MED ORDER — BISACODYL 10 MG RE SUPP
10.0000 mg | Freq: Once | RECTAL | Status: DC
  Filled 2017-05-10: qty 1

## 2017-05-10 NOTE — Care Management (Signed)
Medical Necessity completed for transport back to [facility] today. Primary nurse updated.

## 2017-05-10 NOTE — Progress Notes (Signed)
Physical Therapy Treatment Patient Details Name: [name] MRN: [id number] DOB: February 14, 1922 Today's Date: 05/10/2017    History of Present Illness pt is a 83 y.o F admitted on 05/07/2017 due a fall and fractures her superior-inferior pubic rami, She has a history of  Multiple medical problems including coronary artery disease chronic atrial fibrillation, chronic systolic CHF, dyslipidemia, essential hypertension, GERD, hypothyroidism. She currently resides at [facility] which is independent living.     PT Comments    Pt agreeable to PT. Reports no pain at rest; 5 with R hip flexion, but other exercises did not increased pain. Pt participates in supine exercises with progressed repetitions. Pt fatigued post session, but no pain at rest. Pt to be discharged today to skilled nursing facility to continue rehab efforts.    Follow Up Recommendations  SNF     Equipment Recommendations  Rolling walker with 5" wheels    Recommendations for Other Services       Precautions / Restrictions Precautions Precautions: Fall Restrictions Weight Bearing Restrictions: No    Mobility  Bed Mobility               General bed mobility comments: Not tested; pt prepared for discharge via EMS  Transfers                    Ambulation/Gait                 Stairs            Wheelchair Mobility    Modified Rankin (Stroke Patients Only)       Balance                                            Cognition Arousal/Alertness: Awake/alert Behavior During Therapy: WFL for tasks assessed/performed Overall Cognitive Status: Within Functional Limits for tasks assessed                                        Exercises General Exercises - Lower Extremity Ankle Circles/Pumps: AROM;Both;20 reps;Supine Quad Sets: Strengthening;Both;20 reps;Supine Gluteal Sets: Strengthening;Both;20 reps;Supine Short Arc Quad: AROM;Both;20  reps;Supine Heel Slides: AAROM;Both;20 reps;Supine Hip ABduction/ADduction: AAROM;Both;20 reps;Supine Straight Leg Raises: AAROM;Both;10 reps;Supine (2 sets )    General Comments        Pertinent Vitals/Pain Pain Assessment: 0-10 Pain Score: 5  Pain Location: R groin/hip (with hip flexion only today) Pain Intervention(s): Monitored during session    Home Living                      Prior Function            PT Goals (current goals can now be found in the care plan section) Progress towards PT goals: Progressing toward goals    Frequency    Min 2X/week      PT Plan Current plan remains appropriate    Co-evaluation              AM-PAC PT "6 Clicks" Daily Activity  Outcome Measure  Difficulty turning over in bed (including adjusting bedclothes, sheets and blankets)?: Unable Difficulty moving from lying on back to sitting on the side of the bed? : Unable Difficulty sitting down on and standing up from a chair with  arms (e.g., wheelchair, bedside commode, etc,.)?: Unable Help needed moving to and from a bed to chair (including a wheelchair)?: A Little Help needed walking in hospital room?: A Lot Help needed climbing 3-5 steps with a railing? : Total 6 Click Score: 9    End of Session   Activity Tolerance: Patient tolerated treatment well;Other (comment) (fatigued post session) Patient left: in bed;with call bell/phone within reach;with bed alarm set   PT Visit Diagnosis: Muscle weakness (generalized) (M62.81);History of falling (Z91.81);Other abnormalities of gait and mobility (R26.89);Difficulty in walking, not elsewhere classified (R26.2);Adult, failure to thrive (R62.7);Pain Pain - Right/Left: Right Pain - part of body: Hip     Time: 7591-6384 PT Time Calculation (min) (ACUTE ONLY): 28 min  Charges:  $Therapeutic Exercise: 23-37 mins                    G Codes:       Larae Grooms, PTA 05/10/2017, 12:09 PM

## 2017-05-10 NOTE — Discharge Summary (Signed)
[facility] [place] NAME: [name]    MR#:  [id number]  DATE OF BIRTH:  05/30/1922  DATE OF ADMISSION:  05/07/2017 ADMITTING PHYSICIAN: Dustin Flock, MD  DATE OF DISCHARGE: 05/10/17  PRIMARY CARE PHYSICIAN: Tower, Wynelle Fanny, MD    ADMISSION DIAGNOSIS:  Syncope and collapse [R55] Hematoma [T14.8XXA] Anemia of decreased vitamin B12 absorption [D51.8] Closed fracture of ramus of right pubis, initial encounter ([name]) [S32.591A]  DISCHARGE DIAGNOSIS:  Closed fracture of the right Pubis s/p fall Extraperitoneal hematoma s/p fall ---now OFF Eliquis Anemia-acute due to hematoma SECONDARY DIAGNOSIS:   Past Medical History:  Diagnosis Date  . Allergy   . Anemia   . Arthritis    osteo  . CAD (coronary artery disease)    a. 2 DES, [facility], 2004; b. nuc 2013 septal dyssenergy 2/2 LBBB, nl LV fxn;  c. 06/2015 MV: EF 41%, septal HK (LBBB), no ischemia->Low risk.  . Chronic atrial fibrillation (HCC)    a. on Coumadin (CHA2DS2VASc = 6); b. Holter 06/2010, no brady, mild tachy.  . Chronic systolic CHF (congestive heart failure) ([place])    a. 03/2015 Echo: EF 30-35%;  b. 01/2016 Echo: EF 30-35%.  . Compression fracture of lumbar vertebra ([name]) 05-2008  . COPD (chronic obstructive pulmonary disease) ([name])   . Diverticulosis of colon   . Dyslipidemia   . Essential hypertension   . Family history of adverse reaction to anesthesia    daughter PONV  . GERD (gastroesophageal reflux disease)   . Hard of hearing   . Hematemesis    a. 03/2015.  [name] Hx of colonoscopy   . Hypothyroid   . Ischemic cardiomyopathy    a. 03/2015 Echo: EF 30-35%, ant, antsept HK, mod MR, mildly dil LA/RV, mod dil RA, mod-sev TR, PASP 67mmHg;  b. 01/2016 Echo: EF 30-35%, ant/antsept HK, mildly dil Ao root (3.4cm) and Asc Ao (3.5cm), mild [email or web address] bi-atrial enlargement, mild to mod TR. PASP 80mmHg.  [name] LBBB (left bundle branch block)   . Lung abnormality    Dr [name] 122/2011  no further w/u needed  . Mitral regurgitation   . Myocardial infarction [facility]) 2016   "mild heart attack"  . Nausea    Some nausea with medications, May, 2013,  . Nocturia   . OA (osteoarthritis)   . Orthostatic hypotension    June, 2014  . Osteoporosis   . Pleural effusion associated with pulmonary infection 03-2007  . Prolonged QT interval    when on sotolol in past  . Pulmonary hypertension ([name])    a. 03/2015 PASP 26mmHg by echo.  . Shingles 2014  . Thrombocytopenia ([name])   . TR (tricuspid regurgitation)    moderate, echo 2008  . Urinary frequency   . Vitamin B deficiency   . Warfarin anticoagulation   . Wears dentures   . Wears glasses   . Weight loss    August, 2012    HOSPITAL COURSE:  StellaPhillipsis a 83 y.o.femalewith a known history of Multiple medical problems including coronary artery disease chronic atrial fibrillation, chronic systolic CHF, dyslipidemia, essential hypertension, GERD, hypothyroidism who was hospitalized for 9 days in June.Patient was trying to ambulate and fell today. She was brought to the emergency room and is having difficulty with ambulation.  1.Right comminuted superior and inferior pubic ramus fractures with 10.1 cm extraperitoneal hematoma s/p fall -Stop anticoagulation---this was d/w dter -Monitor hemoglobin 12--9.6 -Ortho consult with Dr Gardiner Coins management -PT recommends rehab but  due to unsurity for  insurance coverage pt to d/c to [facility] with hospice services  2. Atrial fibrillation Continue metoprolol Anticoagulation will be on hold  3. Relative hypotension  -continue midrodrine -resumed Metoprolol with holding parameters  4. Hypothyroidism continue Synthroid  5. GERD continue Protonix  6. Code DO NOT RESUSCITATE  -pt is followed by hospice at [name]  7.Consit[ation Senna and Miralax.  CONSULTS OBTAINED:  Treatment Team:  [name], MD  DRUG ALLERGIES:   Allergies   Allergen Reactions  . Sulfonamide Derivatives Nausea And Vomiting    REACTION: sick  . Alendronate Sodium Other (See Comments)    REACTION: GI  . Prednisone Palpitations    DISCHARGE MEDICATIONS:   Current Discharge Medication List    START taking these medications   Details  HYDROcodone-acetaminophen (NORCO/VICODIN) 5-325 MG tablet Take 1-2 tablets by mouth every 6 (six) hours as needed for moderate pain. Qty: 15 tablet, Refills: 0    polyethylene glycol (MIRALAX / GLYCOLAX) packet Take 17 g by mouth daily. Qty: 14 each, Refills: 0      CONTINUE these medications which have NOT CHANGED   Details  docusate sodium (COLACE) 100 MG capsule Take 100 mg by mouth at bedtime.     levothyroxine (SYNTHROID, LEVOTHROID) 88 MCG tablet TAKE ONE TABLET BY MOUTH EVERY DAY Qty: 90 tablet, Refills: 1    metoprolol tartrate (LOPRESSOR) 25 MG tablet Take 0.5 tablets (12.5 mg total) by mouth 2 (two) times daily. Qty: 180 tablet, Refills: 3    midodrine (PROAMATINE) 5 MG tablet TAKE ONE TABLET TWICE DAILY WITH A MEAL Qty: 180 tablet, Refills: 0    mirtazapine (REMERON) 15 MG tablet TAKE ONE TABLET AT BEDTIME Qty: 90 tablet, Refills: 3    multivitamin-lutein (OCUVITE-LUTEIN) CAPS capsule Take 1 capsule by mouth daily.    pantoprazole (PROTONIX) 40 MG tablet TAKE ONE TABLET BY MOUTH EVERY DAY Qty: 90 tablet, Refills: 1    simvastatin (ZOCOR) 20 MG tablet TAKE 1/2 TABLET BY MOUTH EVERY EVENING Qty: 45 tablet, Refills: 1    calcium carbonate (OS-CAL) 600 MG TABS tablet Take 1,200 mg by mouth every evening.     fluticasone (FLONASE) 50 MCG/ACT nasal spray 2 SPRAYS IN BOTH NOSTRILS DAILY Qty: 16 g, Refills: 5    Glucosamine HCl 500 MG TABS Take 500 mg by mouth daily.     guaiFENesin-dextromethorphan (ROBITUSSIN DM) 100-10 MG/5ML syrup Take 5 mLs by mouth every 4 (four) hours as needed for cough. Qty: 118 mL, Refills: 0    Multiple Vitamin (MULTIVITAMIN) tablet Take 1 tablet by mouth  daily.     Polyethyl Glycol-Propyl Glycol (SYSTANE OP) Apply 1 drop to eye daily.      STOP taking these medications     apixaban (ELIQUIS) 2.5 MG TABS tablet      potassium chloride (K-DUR) 10 MEQ tablet      torsemide (DEMADEX) 20 MG tablet      warfarin (COUMADIN) 5 MG tablet         If you experience worsening of your admission symptoms, develop shortness of breath, life threatening emergency, suicidal or homicidal thoughts you must seek medical attention immediately by calling 911 or calling your MD immediately  if symptoms less severe.  You Must read complete instructions/literature along with all the possible adverse reactions/side effects for all the Medicines you take and that have been prescribed to you. Take any new Medicines after you have completely understood and accept all the possible adverse reactions/side  effects.   Please note  You were cared for by a hospitalist during your hospital stay. If you have any questions about your discharge medications or the care you received while you were in the hospital after you are discharged, you can call the unit and asked to speak with the hospitalist on call if the hospitalist that took care of you is not available. Once you are discharged, your primary care physician will handle any further medical issues. Please note that NO REFILLS for any discharge medications will be authorized once you are discharged, as it is imperative that you return to your primary care physician (or establish a relationship with a primary care physician if you do not have one) for your aftercare needs so that they can reassess your need for medications and monitor your lab values. Today   SUBJECTIVE   Constipation Family in the room VITAL SIGNS:  Blood pressure (!) 112/59, pulse 88, temperature 97.9 F (36.6 C), temperature source Oral, resp. rate 18, height 5\' 7"  (1.702 m), weight 53.6 kg (118 lb 1.6 oz), SpO2 93 %.  I/O:    Intake/Output  Summary (Last 24 hours) at 05/10/17 1032 Last data filed at 05/10/17 0900  Gross per 24 hour  Intake              720 ml  Output              300 ml  Net              420 ml    PHYSICAL EXAMINATION:  GENERAL:  83 y.o.-year-old patient lying in the bed with no acute distress. Thin, weak EYES: Pupils equal, round, reactive to light and accommodation. No scleral icterus. Extraocular muscles intact.  HEENT: Head atraumatic, normocephalic. Oropharynx and nasopharynx clear.  NECK:  Supple, no jugular venous distention. No thyroid enlargement, no tenderness.  LUNGS: Normal breath sounds bilaterally, no wheezing, rales,rhonchi or crepitation. No use of accessory muscles of respiration.  CARDIOVASCULAR: S1, S2 normal. No murmurs, rubs, or gallops.  ABDOMEN: Soft, non-tender, non-distended. Bowel sounds present. No organomegaly or mass.  EXTREMITIES: No pedal edema, cyanosis, or clubbing.  NEUROLOGIC: Cranial nerves II through XII are intact. Muscle strength 5/5 in all extremities. Sensation intact. Gait not checked. Deconditioned PSYCHIATRIC: The patient is alert and oriented x 3.  SKIN: No obvious rash, lesion, or ulcer.   DATA REVIEW:   CBC   Recent Labs Lab 05/08/17 0442 05/09/17 0326  WBC 9.1  --   HGB 9.6* 9.0*  HCT 28.2*  --   PLT 80*  --     Chemistries   Recent Labs Lab 05/08/17 0442  NA 140  K 4.5  CL 102  CO2 31  GLUCOSE 113*  BUN 47*  CREATININE 1.85*  CALCIUM 8.7*    Microbiology Results   No results found for this or any previous visit (from the past 240 hour(s)).  RADIOLOGY:  No results found.   Management plans discussed with the patient, family and they are in agreement.  CODE STATUS:     Code Status Orders        Start     Ordered   05/07/17 1558  Do not attempt resuscitation (DNR)  Continuous    Question Answer Comment  In the event of cardiac or respiratory ARREST Do not call a "code blue"   In the event of cardiac or respiratory ARREST  Do not perform Intubation, CPR, defibrillation or ACLS   In the  event of cardiac or respiratory ARREST Use medication by any route, position, wound care, and other measures to relive pain and suffering. May use oxygen, suction and manual treatment of airway obstruction as needed for comfort.      05/07/17 1557    Code Status History    Date Active Date Inactive Code Status Order ID Comments User Context   04/07/2017  3:58 PM 04/15/2017 11:39 PM DNR [id number]  Baxter Hire, MD ED   10/10/2016  4:39 PM 10/14/2016  6:28 PM DNR [id number]  [name], MD Inpatient   03/24/2016  7:28 PM 04/02/2016  8:09 PM DNR [id number]  Leeroy Cha, MD Inpatient   03/23/2016  7:01 PM 03/24/2016  7:28 PM Full Code [id number]  Leeroy Cha, MD Inpatient   04/07/2015  7:41 PM 04/12/2015  4:13 PM DNR [id number]  Max Sane, MD Inpatient    Advance Directive Documentation     Most Recent Value  Type of Advance Directive  Out of facility DNR (pink MOST or yellow form), Living will, Healthcare Power of Attorney  Pre-existing out of facility DNR order (yellow form or pink MOST form)  -  "MOST" Form in Place?  -      TOTAL TIME TAKING CARE OF THIS PATIENT: *40* minutes.    Ahyana Skillin M.D on 05/10/2017 at 10:32 AM  Between 7am to 6pm - Pager - [phone] After 6pm go to [email or web address] - password [email or web address] [place] [facility]  Office  [phone]  CC: Primary care physician; Tower, Wynelle Fanny, MD

## 2017-05-10 NOTE — Progress Notes (Signed)
Visit made. Patient seen sitting up in bed eating lunch, daughter [name] at bedside. [name] reports that the hospital bed is in place. Plan is for discharge via EMS today. [name] asked about Physical Therapy for her mother. Writer explained that the hospice team would evaluate patient and order as needed. She voiced understanding. [name] also plans to have 24 hr care givers in place as at this time patient remains nonambulatory. Discharge summary faxed to triage. Hospice team updated. Thank you. Flo Shanks RN, BSN, [facility] [place], hospital Liaison [phone] c

## 2017-05-10 NOTE — Progress Notes (Signed)
Patient is being discharged back to home, [facility], with hospice. Discharge and Rx instructions given to her daughter and she acknowledged understanding. IV removed, belongings packed and EMS called.

## 2017-05-11 ENCOUNTER — Telehealth: Payer: Self-pay

## 2017-05-11 ENCOUNTER — Ambulatory Visit: Payer: PPO | Admitting: Family Medicine

## 2017-05-11 NOTE — Telephone Encounter (Signed)
Spoke with patient's daughter.  [name] reports that mom was back in the hospital over the weekend after suffering a fall and incurring a pelvic fracture.  She has been taken off of coumadin/Eliquis and is now under hospice care.    Daughter has her at home [facility]) with 24 hour care.  She has asked that I copy this update to Dr. Glori Bickers and to Dr. Rockey Situ as an Juluis Rainier.  Patient and daughter aware of how sorry we are to hear this and to call us if they need any assistance or have any questions.

## 2017-05-12 ENCOUNTER — Telehealth: Payer: Self-pay | Admitting: Cardiovascular Disease

## 2017-05-12 ENCOUNTER — Telehealth: Payer: Self-pay

## 2017-05-12 MED ORDER — CEFUROXIME AXETIL 250 MG PO TABS: 250.0000 mg | ORAL_TABLET | Freq: Two times a day (BID) | ORAL | 0 refills | Status: DC

## 2017-05-12 NOTE — Telephone Encounter (Signed)
I know she is a hospice pt - if her daughter wants [place] to cover her with an empiric abx for pneumonia/ uti I am happy to do that  Please let me know

## 2017-05-12 NOTE — Telephone Encounter (Signed)
I sent ceftin to her pharmacy  Keep [place] posted

## 2017-05-12 NOTE — Telephone Encounter (Addendum)
Spoke with patients daughter to see how her mother was doing since discharge. She reports that they were able to get a hospital bed for her mother and that they also have Hospice arranged. She went back to [facility] and she has hired around the clock sitters to be with her as well. Let her know that we were thinking of her and to please call if she has any concerns or questions. She was very appreciative for the call and had no further needs at this time.

## 2017-05-12 NOTE — Telephone Encounter (Signed)
[name] with Hospice notified Rx sent to pharmacy and to keep [place] updated

## 2017-05-12 NOTE — Telephone Encounter (Signed)
Thanks for the update- I will sign the paperwork from hospice

## 2017-05-12 NOTE — Telephone Encounter (Signed)
Helene Kelp nurse with [facility] said that pt fx pelvis after fall over weekend; due to ins issues pt did not go to rehab and is now back at [facility]; Aubrey saw pt on 05/11/17 and pt had insignificant cough and had foley put in; pt is bed bound now. Aide gave bath to pt today and then called Helene Kelp to let her know pt has fever of 101.2. Helene Kelp is unaware of any new symptoms and request what to do if anything. [facility].

## 2017-05-12 NOTE — Telephone Encounter (Signed)
Daughter does want [place] to send in abx, she uses [facility] pharmacy, also daughter said pt is bed bound so hospital f/u appt scheduled with Dr. Glori Bickers on 05/17/17 cancelled per daughter request

## 2017-05-17 ENCOUNTER — Telehealth: Payer: Self-pay

## 2017-05-17 ENCOUNTER — Ambulatory Visit: Payer: PPO | Admitting: Family Medicine

## 2017-05-17 NOTE — Telephone Encounter (Signed)
Client [place] Night - Client Client Site [place] Physician [place], [place] Type Call Call [place] Page Now Who Is [place] [facility] [place] Name [facility] Name [place] Number [phone] Patient Name [name] Patient DOB 1922/05/23 Reason for Call Request to speak to Physician Initial Comment Caller stated [facility] with Hospice and has patient that was started on a new medication on 8/30 for UTI. Stated has a fever of 101.9 and Tylenol is not helping. Stated urine is very dark and has a strong odor. Wanted to know if DR would be okay with having a culture done. Additional Comment Paging DoctorName Phone DateTime Result/Outcome Message Type Notes Shanon Ace - MD [phone] 05/14/2017 1:24:51 PM Called On Call Provider - Reached Doctor Paged Shanon Ace - MD 05/14/2017 1:25:57 PM Spoke with On Call - General Message Result Spoke with On Call, information provided and warm conference the call.

## 2017-05-18 MED ORDER — LEVOFLOXACIN 250 MG PO TABS: 250.0000 mg | ORAL_TABLET | Freq: Every day | ORAL | 0 refills | Status: DC

## 2017-05-18 NOTE — Addendum Note (Signed)
Addended by: Loura Pardon A on: 05/18/2017 07:30 PM   Modules accepted: Orders

## 2017-05-18 NOTE — Telephone Encounter (Signed)
Received urine cx and uti is resistant to med given  D/c ceftin   Will need to try levaquin (lower dose for renal status)   Please call in levaquin 250 mg once daily for 5 d Enc fluid intake if you can

## 2017-05-19 MED ORDER — LEVOFLOXACIN 250 MG PO TABS: 250.0000 mg | ORAL_TABLET | Freq: Every day | ORAL | 0 refills | Status: DC

## 2017-05-19 NOTE — Telephone Encounter (Signed)
Left voicemail requesting pt's daughter [name] to return my call

## 2017-05-19 NOTE — Addendum Note (Signed)
Addended by: Tammi Sou on: 05/19/2017 08:51 AM   Modules accepted: Orders

## 2017-05-19 NOTE — Telephone Encounter (Signed)
[name] returned my call. I advise her of urine cx results and new Rx was sent to pharmacy. Daughter said hospice is going to see if their PT can get her up and walking again, also she has had a lot of congestion and they think it's related to her heart issues so they are going to call her cardiologist today

## 2017-05-20 ENCOUNTER — Telehealth: Payer: Self-pay | Admitting: Cardiovascular Disease

## 2017-05-20 NOTE — Telephone Encounter (Signed)
Left voicemail message that we are not managing her hospice care but to please give me a call back.

## 2017-05-20 NOTE — Telephone Encounter (Signed)
Spoke with Rosanne Ashing RN with hospice and she reports patients concerns regarding cough with white foamy sputum that is thick and slimy. Advised her to please check with PCP because after discussing with Dr. Rockey Situ it does not sound like a cardiac problem. She was very appreciative for our time and had no further questions at this time.

## 2017-05-20 NOTE — Telephone Encounter (Signed)
Hospice calling on behalf of daughter to express concern for patient cough.    She has White Foamy Sputum and the patient is complaining this is thick and slimy.

## 2017-05-21 NOTE — Telephone Encounter (Signed)
If there is concern this could be CHF causing the cough and flem She could take lasix daily for a week, rather than every other day She does have a hx of pulmonary HTN

## 2017-05-23 ENCOUNTER — Telehealth: Payer: Self-pay | Admitting: Family Medicine

## 2017-05-23 MED ORDER — HYDROCODONE-ACETAMINOPHEN 5-325 MG PO TABS: 1.0000 | ORAL_TABLET | Freq: Four times a day (QID) | ORAL | 0 refills | Status: DC | PRN

## 2017-05-23 NOTE — Telephone Encounter (Signed)
Notice from hospice (in IN box) Needs refill of hydrocodone which she takes bid for pain from fx pelvis   Coughing up wite foamy sputum Doubts from chf  Could be bronchitis or lung infection- however she was on levaquin recently for uti so that should cover   Px printed to send

## 2017-05-23 NOTE — Telephone Encounter (Signed)
Printed in IN box

## 2017-05-24 NOTE — Telephone Encounter (Signed)
Teressa notified of Dr. Marliss Coots comments and she requested I call daughter and advise her to. I spoke with [name] and advise her of Dr. Marliss Coots comments and she verbalized understanding

## 2017-05-24 NOTE — Telephone Encounter (Signed)
If she is comfort care only at this point- I do not see a reason to start them  (this means we are giving medicines for comfort only)  If their goals have changed please let me know

## 2017-05-24 NOTE — Telephone Encounter (Signed)
Rx faxed and Teressa RN with Hospice notified and advise of Dr. Marliss Coots comments.  [name] advise me that the cough and congestion she was coughing up has resolved so maybe the abx did help clear it up.  [name] wanted me to ask Dr. Glori Bickers a question. Pt's daughter [name] asked the Hospice nurse if they are going to ever restart any of her blood thinners? Pt's coumadin and Eliquis was d/c at the hospital. Also her fluid pill and K was d/c too, she wants to know if Dr. Glori Bickers is going to have her restart any of thoses meds back, please advise. [name] said if she doesn't answer we can leave a detailed VM on her secured # [phone]

## 2017-06-05 ENCOUNTER — Telehealth: Payer: Self-pay | Admitting: Family Medicine

## 2017-06-05 MED ORDER — CEFDINIR 300 MG PO CAPS
300.0000 mg | ORAL_CAPSULE | Freq: Every day | ORAL | 0 refills | Status: DC
Start: 2017-06-05 — End: 2017-10-19

## 2017-06-05 NOTE — Telephone Encounter (Signed)
On call note: pt with urinary retention, recent UTI, finished antibiotic 3 d/a.   Order to insert indwelling foley cath, also sent in cefdinir 300 mg qd x 7d.

## 2017-06-06 IMAGING — CR DG CHEST 1V PORT
1 series · 1 of 1 positions shown · non-contrast
Comparison: 03/24/2016

CLINICAL DATA: Shortness of Breath

EXAM:
PORTABLE CHEST 1 VIEW

[AP]
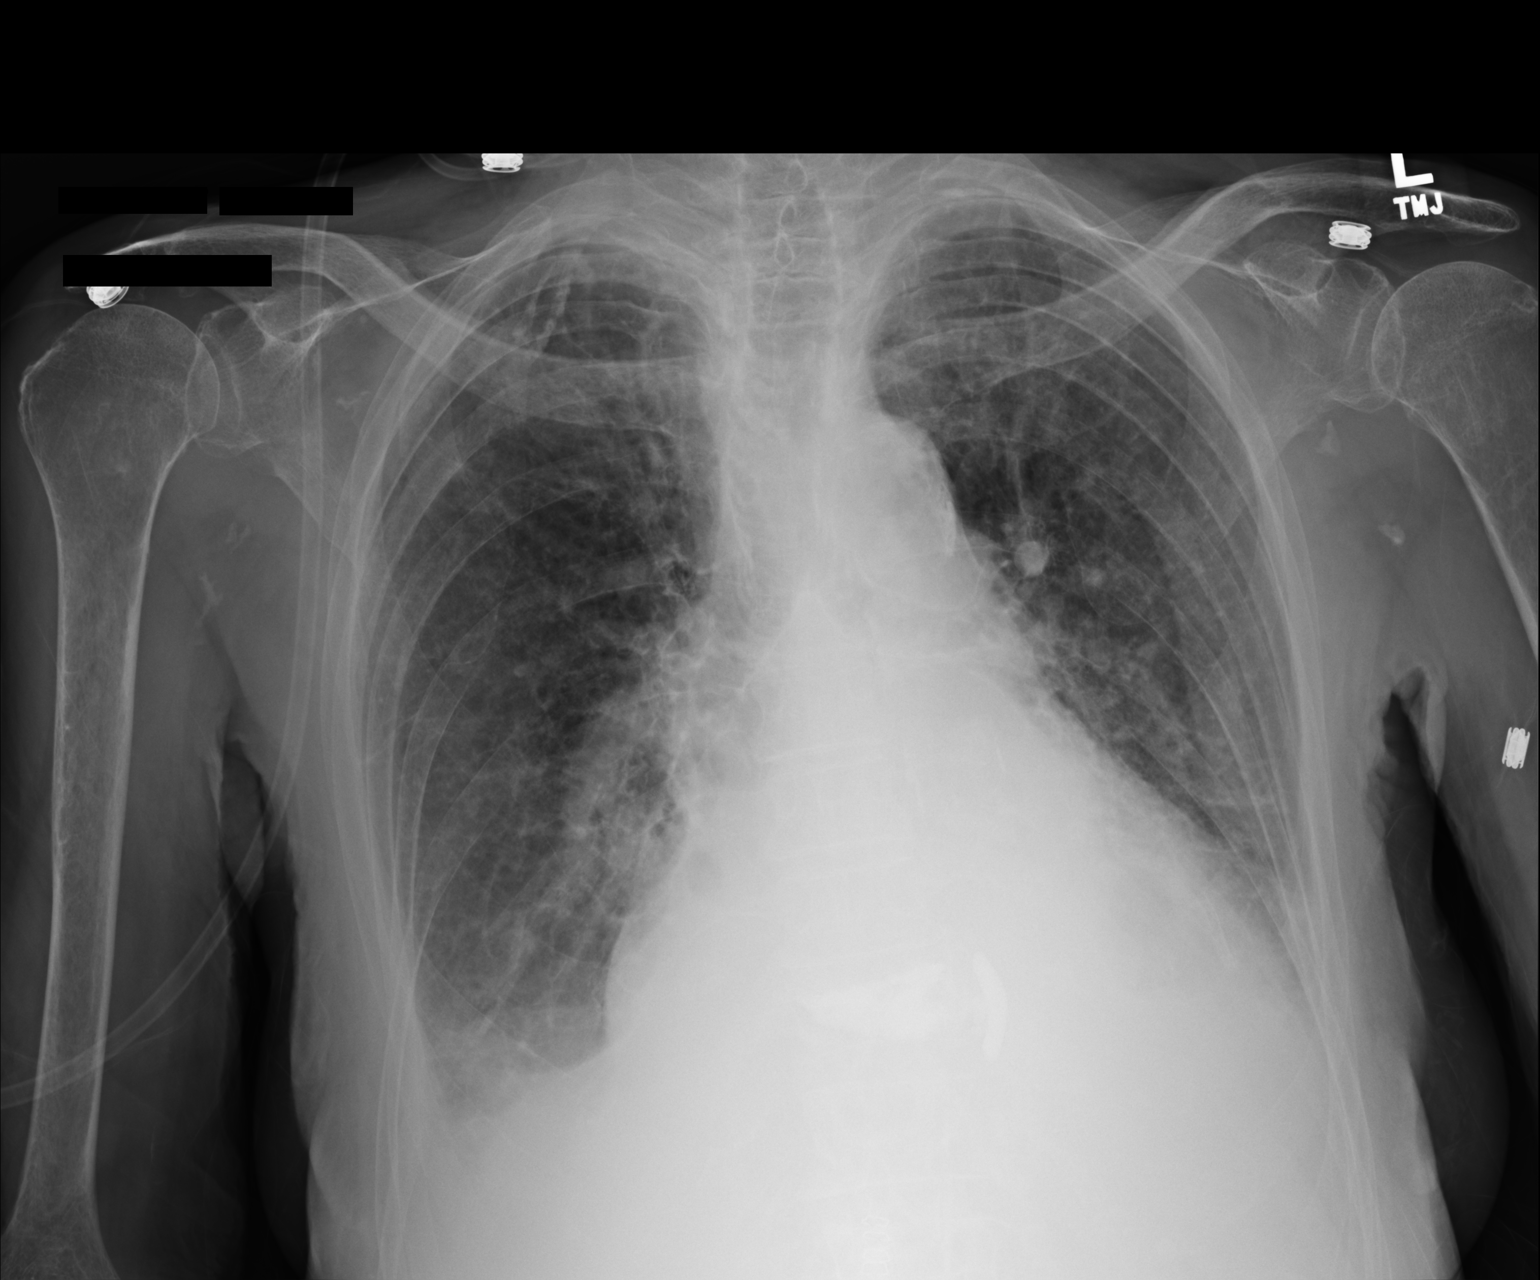

[1 of 1 positions shown; findings below may reference images not displayed]

FINDINGS: Cardiomegaly again noted. No pulmonary edema. Mild perihilar
bronchitic changes. There is small bilateral pleural effusion with
bilateral basilar atelectasis or infiltrate. Atherosclerotic
calcifications of thoracic aorta again noted
IMPRESSION: Cardiomegaly. Small bilateral pleural effusion with bilateral
basilar atelectasis or infiltrate. Mild perihilar bronchitic
changes.

## 2017-06-06 MED ORDER — LEVOFLOXACIN 250 MG PO TABS: 250.0000 mg | ORAL_TABLET | Freq: Every day | ORAL | 0 refills | Status: DC

## 2017-06-06 NOTE — Telephone Encounter (Signed)
Spoke to [employer] and advised per Dr Glori Bickers. New Rx sent to pharmacy

## 2017-06-06 NOTE — Telephone Encounter (Signed)
Please clarify- is she on cipro right now? What dose and what day was it started? Was a UA done over the weekend or is there a culture pending?   Thanks

## 2017-06-06 NOTE — Telephone Encounter (Signed)
Please get a urine culture just so we know we gave the right thing Please call in levaquin 250 mg 1 po qd for 5 d #5 no refills   Also enc fluids if able

## 2017-06-06 NOTE — Telephone Encounter (Signed)
Crystal called back and left v/m;was given incorrect info. Pt did get abx over weekend but has not been picked up due to pts daughter wanting levaquin; since pt is not on abx does Dr Glori Bickers want pt to have u/a and c/s. Or does Dr Glori Bickers want to start pt on Levaquin; pt does have indwelling cath and urine appears thick and dark yellow. Pt is still having burning sensation. Crystal request cb.

## 2017-06-06 NOTE — Telephone Encounter (Signed)
PLEASE NOTE: All timestamps contained within this report are represented as [place] Standard Time. CONFIDENTIALTY NOTICE: This fax transmission is intended only for the addressee. It contains information that is legally privileged, confidential or otherwise protected from use or disclosure. If you are not the intended recipient, you are strictly prohibited from reviewing, disclosing, copying using or disseminating any of this information or taking any action in reliance on or regarding this information. If you have received this fax in error, please notify us immediately by telephone so that we can arrange for its return to [place]. Phone: [phone], Toll-Free: [phone], Fax: [phone] Page: 1 of 2 Call Id: [id number] [place] Patient Name: [name] Gender: Unknown DOB: [birth year] Age: 83 Y 37 M 14 D Return Phone Number: [phone] (Primary) Address: City/State/Zip: [place] Client [place] Night - Client Client Site [place] Physician AA - PHYSICIAN, Verita Schneiders- MD Contact Type Call Who Is Calling Patient / Member / Family / Caregiver Call Type Triage / Clinical Caller Name Lorelle Formosa Relationship To Patient Care Giver Return Phone Number Please choose phone number Chief Complaint Urinary Catheter Problems Reason for Call Symptomatic / Request for Ambia is a hospice nurse calling on behalf of the patient. Patient has had a catheter removed on the 20th and has had burning and an urgency to go since then. She was on an antibiotic that ended on 09/11 for a UTI. Translation No Nurse Assessment Nurse: Ardine Bjork, RN, Melissa Date/Time (Eastern Time): 06/05/2017 5:49:53 PM Confirm and document reason for call. If symptomatic, describe symptoms. ---Caller is a hospice nurse calling on behalf of the  patient. Patient has had a catheter removed on the 20th. She was on an antibiotic that ended on 09/11 for a UTI. Today sxs of urgency and dysuria started. Does the patient have any new or worsening symptoms? ---Yes Will a triage be completed? ---Yes Related visit to physician within the last 2 weeks? ---No Does the PT have any chronic conditions? (i.e. diabetes, asthma, etc.) ---Yes List chronic conditions. ---COPD, Ischemic caridomyopathy heart and lungs.A-Fib Is this a behavioral health or substance abuse call? ---No Guidelines Guideline Title Affirmed Question Affirmed Notes Nurse Date/Time (Eastern Time) Urination Pain - Female [1] Unable to urinate (or only a few drops) > 4 hours AND [2] bladder feels very full (e.g., palpable bladder or strong urge to urinate) Zayas, RN, Melissa 06/05/2017 5:51:37 PM PLEASE NOTE: All timestamps contained within this report are represented as [place] Standard Time. CONFIDENTIALTY NOTICE: This fax transmission is intended only for the addressee. It contains information that is legally privileged, confidential or otherwise protected from use or disclosure. If you are not the intended recipient, you are strictly prohibited from reviewing, disclosing, copying using or disseminating any of this information or taking any action in reliance on or regarding this information. If you have received this fax in error, please notify us immediately by telephone so that we can arrange for its return to [place]. Phone: [phone], Toll-Free: [phone], Fax: [phone] Page: 2 of 2 Call Id: [id number] [place]. Time Eilene Ghazi Time) Disposition Final User 06/05/2017 6:06:14 PM Paged On Call back to [facility] [place], Lenna Sciara 06/05/2017 6:07:59 PM Paged On Call back to [facility], RN, Lenna Sciara 06/05/2017 6:20:39 PM Call Completed Ardine Bjork, RN, Lenna Sciara 06/05/2017 6:07:30 PM Go to ED Now Yes Zayas, RN, Emelia Salisbury Disagree/Comply Disagree Caller Understands  Yes PreDisposition Call Doctor Care Advice Given Per Guideline CARE ADVICE given per Urination Pain - Female (Adult) guideline. Comments User: Dub Mikes, RN Date/Time Eilene Ghazi Time): 06/05/2017 6:07:45 PM Pt is a hospice pt. User: Dub Mikes, RN Date/Time Eilene Ghazi Time): 06/05/2017 6:31:32 PM Returned call to Nurse Mariann Laster and informed of orders-states she will have a nurse go and place indwelling catheter now-informed MD to send in abx to [facility]. Wanda verbalizes understanding/appr. User: Dub Mikes, RN Date/Time Eilene Ghazi Time): 06/05/2017 6:34:10 PM Call to dtr and informed med to be sent to [facility] per MD and nurse to place indwelling catheter. Referrals GO TO FACILITY REFUSED Paging DoctorName Phone DateTime Result/Outcome Message Type Notes Crissie Sickles - MD [phone] 06/05/2017 6:06:14 PM Paged On Call Back to Call Center Doctor Paged Dr Shearon Balo call Melissa at [facility] at contact number [phone]. Crissie Sickles - MD 06/05/2017 6:20:30 PM Spoke with On Call - General Message Result Spoke with Dr Vernice Jefferson given-Dr Ernestine Conrad states he will call in an ab=x-Verbal order: Hospice nurse to place indwelling catheter

## 2017-06-06 NOTE — Telephone Encounter (Signed)
[place] with [place] left v/m; pt was started on Cipro over the weekend and indwelling cath was reinserted but pt still complaining with burning and discomfort; pts daughter said last time pt had UTI Cipro did not help and pt was switched to levaquin for 5 days. pts daughter is requesting abx changed to levaquin. Crystal is seeing pt later this morning and request cb[facility].

## 2017-06-06 NOTE — Addendum Note (Signed)
Addended by: Modena Nunnery on: 06/06/2017 02:30 PM   Modules accepted: Orders

## 2017-06-08 IMAGING — CR DG CHEST 1V PORT
1 series · 1 of 1 positions shown · non-contrast
Comparison: 03/31/2016.

CLINICAL DATA: Shortness of breath today.

EXAM:
PORTABLE CHEST 1 VIEW

[AP]
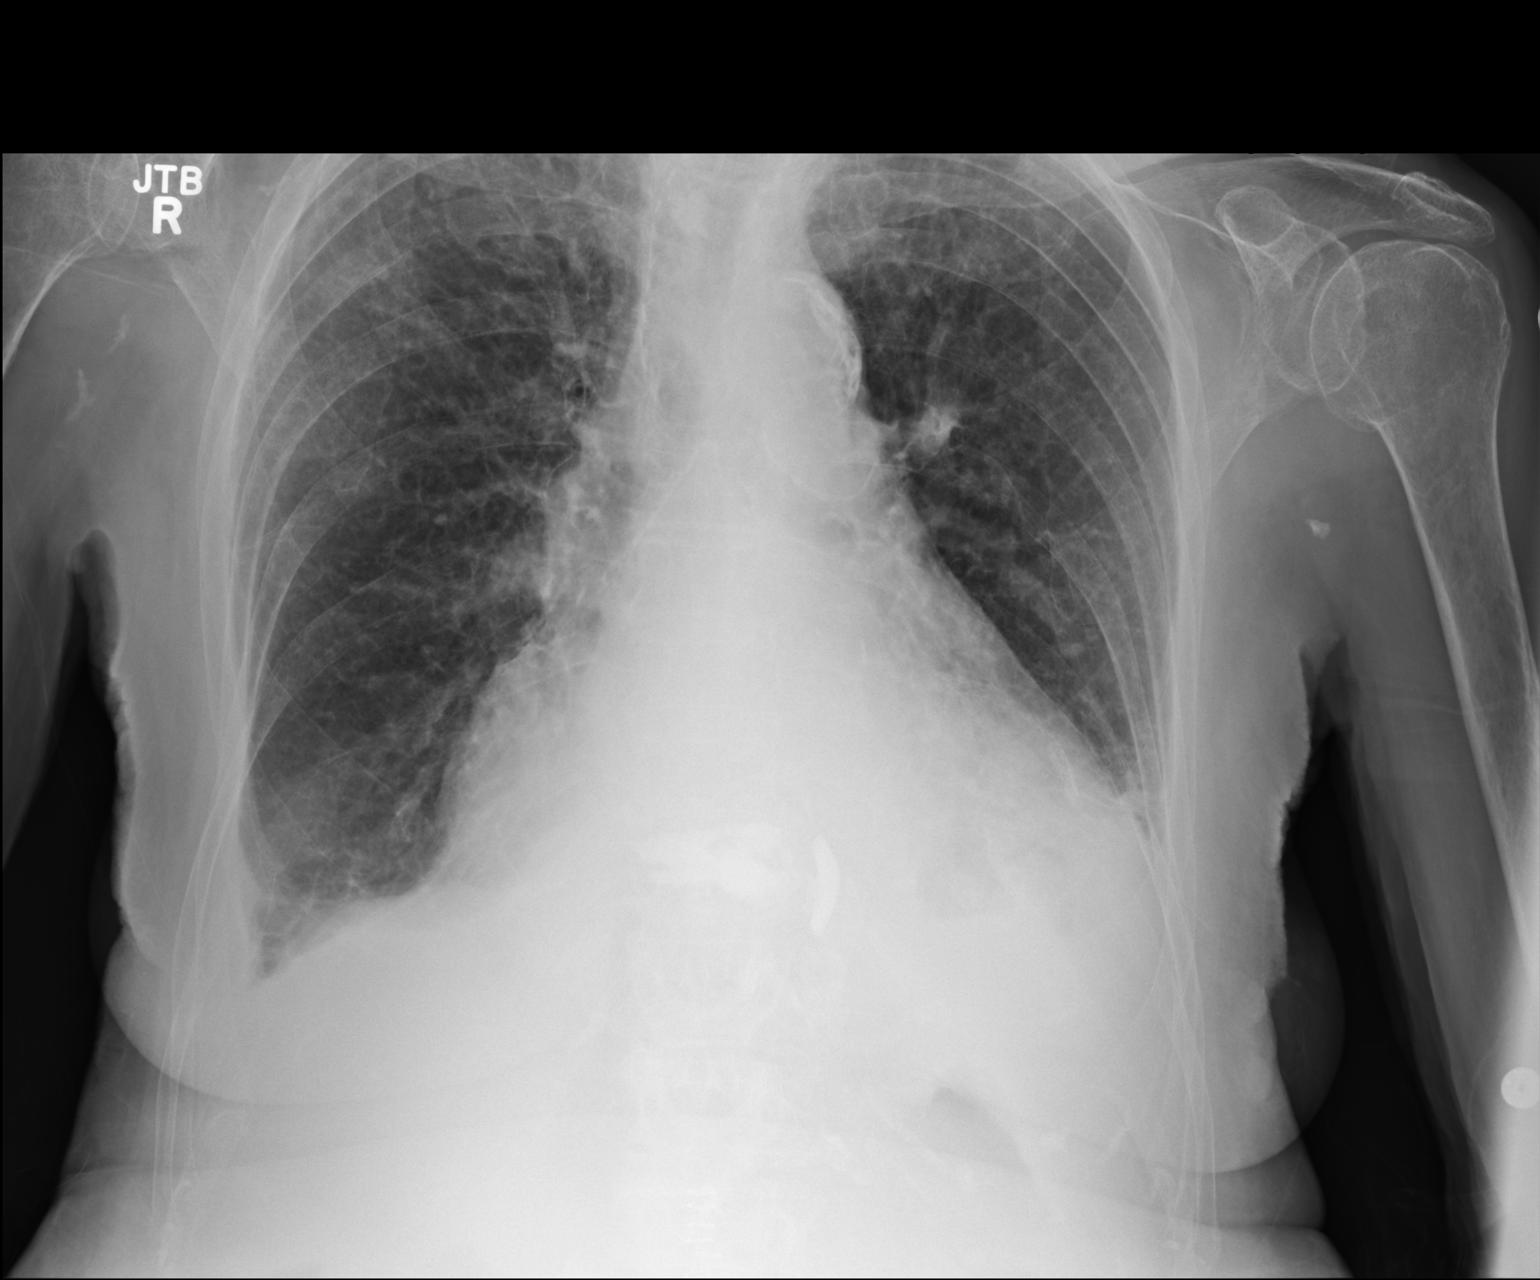

[1 of 1 positions shown; findings below may reference images not displayed]

FINDINGS: Stable enlarged cardiac silhouette. Increased left basilar linear
and patchy opacity. Mildly improved linear density at the right lung
base. No significant change in bilateral pleural effusions. Mildly
prominent pulmonary vasculature with improvement. Slight increase in
prominence of the interstitial markings. Diffuse osteopenia and left
shoulder degenerative changes. Lower thoracic spine kyphoplasty
material.
IMPRESSION: 1. Interval mild interstitial pulmonary edema with stable
cardiomegaly and mildly improved pulmonary vascular congestion.
2. Stable small bilateral pleural effusions.
3. Mildly increased left basilar atelectasis and mildly improved
right basilar atelectasis.

## 2017-06-09 ENCOUNTER — Telehealth: Payer: Self-pay

## 2017-06-09 MED ORDER — FLUCONAZOLE 200 MG PO TABS: 200.0000 mg | ORAL_TABLET | Freq: Once | ORAL | 0 refills | Status: AC

## 2017-06-09 MED ORDER — FLUCONAZOLE 100 MG PO TABS: ORAL_TABLET | ORAL | 0 refills | Status: DC

## 2017-06-09 NOTE — Telephone Encounter (Signed)
Hospice nurse advise of Dr. Marliss Coots comments and instructions and Rxs sent to pharmacy

## 2017-06-09 NOTE — Telephone Encounter (Signed)
Torsemide is fine  Diflucan 200 mg by mouth today followed by 100 mg daily for 5 days please  Thanks

## 2017-06-09 NOTE — Telephone Encounter (Signed)
[facility] left v/m; pt has 2 more days of Levaquin for UTI and pt is having swelling in legs and feet up to the calves; 2+ edema, pt is SOB but pt denies feeling SOB,oxy sats were 78% when first checked while pt sitting; did come up to 92%. Pt walking with walker. Pt has torsemide 20 mg in the home; gave pt torsemide 20 mg today and plan to give for 2 more days unless Dr Glori Bickers objects; pt having vaginal discharge, ? Yeast infection; pt does not eat yogurt and Crystal request diflucan 200 mg on first day followed by 100 mg daily as prescribed by Dr Glori Bickers. Crystal request cb.

## 2017-06-10 ENCOUNTER — Encounter: Payer: Self-pay | Admitting: Family Medicine

## 2017-07-19 ENCOUNTER — Encounter: Payer: Self-pay | Admitting: Family Medicine

## 2017-07-20 ENCOUNTER — Telehealth: Payer: Self-pay

## 2017-07-20 NOTE — Telephone Encounter (Signed)
[place] at [facility] transferred [place] with [employer] ins; on 04/05/17 visit Raina needs to ask questions about coagulation mgt. To see if can get claim pd.

## 2017-07-21 NOTE — Telephone Encounter (Signed)
Spoke with insurance rep, jeanette and answered questions regarding patient's 04/05/17 coag encounter.  They needed clarification on the dx codes used and explanation of what type of visit this was for.  Info given and appears to satisfy claim concerns.

## 2017-07-27 ENCOUNTER — Telehealth: Payer: Self-pay

## 2017-07-27 NOTE — Telephone Encounter (Signed)
Copied from [place] #[id number]. Topic: Inquiry >> Jul 27, 2017 10:11 AM Bea Graff, NT wrote: Reason for CRM: Patients daughter calling to see if this patient needs to be scheduled for the new pneumonia vaccine (2 part series).

## 2017-07-27 NOTE — Telephone Encounter (Signed)
It looks like she needs a prevnar - but I do not think Shingrix is available yet

## 2017-07-27 NOTE — Telephone Encounter (Signed)
[name] pts daughter wants to know if pt needs to have the prevnar 13 shot and should pt have the new 2 part shingles vaccine. [facility].

## 2017-07-28 NOTE — Telephone Encounter (Signed)
Daughter notified and she will check with the pharmacy about the Shingrix and also take pt to get the prevnar at the pharmacy if possible

## 2017-08-10 ENCOUNTER — Other Ambulatory Visit: Payer: Self-pay | Admitting: Cardiovascular Disease

## 2017-08-16 ENCOUNTER — Other Ambulatory Visit: Payer: Self-pay | Admitting: Family Medicine

## 2017-09-01 ENCOUNTER — Encounter: Payer: Self-pay | Admitting: *Deleted

## 2017-09-01 ENCOUNTER — Telehealth: Payer: Self-pay | Admitting: Family Medicine

## 2017-09-01 NOTE — Telephone Encounter (Signed)
Rosanne Ashing, RN with [facility] called regarding [name]. She said that the patient has been experiencing burning with urination.So Rosanne Ashing is going to collect a urine sample to send over.

## 2017-09-01 NOTE — Telephone Encounter (Signed)
Fine- please do UA and cx  Thanks

## 2017-09-01 NOTE — Telephone Encounter (Signed)
This encounter was created in error - please disregard.

## 2017-09-01 NOTE — Telephone Encounter (Signed)
Will put the order in once they drop off the sample

## 2017-09-07 ENCOUNTER — Other Ambulatory Visit: Payer: Self-pay | Admitting: Family Medicine

## 2017-09-07 MED ORDER — HYDROCODONE-ACETAMINOPHEN 5-325 MG PO TABS: 1.0000 | ORAL_TABLET | Freq: Four times a day (QID) | ORAL | 0 refills | Status: DC | PRN

## 2017-09-07 NOTE — Telephone Encounter (Signed)
Printed to fax to pharmacy  In IN box  Thanks

## 2017-09-07 NOTE — Telephone Encounter (Signed)
Copied from [place]. Topic: Quick Communication - See Telephone Encounter >> Sep 07, 2017  2:59 PM Hewitt Shorts wrote: CRM for notification. See Telephone encounter for:  Hospice is calling to get are refill on pt hydrocodone nd it needs to [facility] putting [facility]ospice on the RX    Teressa from hospice if any questions [phone] 09/07/17.

## 2017-09-07 NOTE — Telephone Encounter (Signed)
Request for controlled substance 

## 2017-09-07 NOTE — Telephone Encounter (Signed)
Requesting rx hydrocodone apap. Call when ready for pick up.last printed # 60 on 05/23/17 pt last seen hospital f/u 10/19/16. Hospice pt; [facility].

## 2017-09-07 NOTE — Telephone Encounter (Signed)
Controlled substance for a Hospice pt  (refill)

## 2017-09-08 NOTE — Telephone Encounter (Signed)
Rx faxed

## 2017-09-22 ENCOUNTER — Ambulatory Visit: Payer: Self-pay | Admitting: General Practice

## 2017-09-28 ENCOUNTER — Telehealth: Payer: Self-pay | Admitting: Family Medicine

## 2017-09-28 NOTE — Telephone Encounter (Signed)
Pt daughter (phyllis) dropped off FL2 to be filled out  She would like it faxed to the [place] and [facility] [employer]  See attached paperwork with information. She would also like a copy of the fl2  Please let her know when paperwork is ready  Best number [phone]  In rx tower up front

## 2017-09-28 NOTE — Telephone Encounter (Signed)
In your inbox.

## 2017-09-28 NOTE — Telephone Encounter (Signed)
Working after hours clinic - have to leave now but I will have it ready on Friday

## 2017-09-29 ENCOUNTER — Telehealth: Payer: Self-pay | Admitting: Family Medicine

## 2017-09-29 NOTE — Telephone Encounter (Signed)
Cannot do it until I get back into the office tomorrow   Please ask what diagnoses they want for bed - ie: what in particular makes her unable to get in /get out of / stay in a regular bed ?   (I have to put on the order)- just list the most challenging things for her  Thanks

## 2017-09-29 NOTE — Telephone Encounter (Signed)
Copied from [place] [id number]. Topic: Quick Communication - See Telephone Encounter >> Sep 29, 2017 11:54 AM Arletha Grippe wrote: CRM for notification. See Telephone encounter for:   09/29/17. . She is  asking for rx for hospital bed.  Pt will be releasing her, so pt will need a hospital bed.  [phone]

## 2017-09-29 NOTE — Telephone Encounter (Signed)
Copied from [place] [phone]. Topic: General - Other >> Sep 29, 2017 11:56 AM Arletha Grippe wrote: Reason for CRM: daughter Windy Kalata is calling to see if paperwork is ready . Please call [phone]

## 2017-09-29 NOTE — Telephone Encounter (Signed)
I started the FL2 Please review her current medications with her daughter and fill them in  Also indicate recommended level of care based on where she is going  Thanks Will put it in IN box

## 2017-09-29 NOTE — Telephone Encounter (Signed)
Daughter (phyllis) came by need rx for hospital bed.  She filled out triage form explaining what she needs.  This form is in rx tower up front

## 2017-09-29 NOTE — Telephone Encounter (Signed)
Do not see recent visit except anti coag appt.Please advise.

## 2017-09-30 NOTE — Telephone Encounter (Signed)
Patient made aware that orders were faxed to [place] and she can pick up copy of the FL2 at 4:30 this pm.

## 2017-09-30 NOTE — Telephone Encounter (Signed)
Daughter calling to check on status of paperwork and to know if it will be ready today.

## 2017-09-30 NOTE — Telephone Encounter (Signed)
Sections reviewed with daughter and completed.  Dr. Glori Bickers reviewed before pick up.  [name] (daughter) picked up 2 copies of FL2 and this Probation officer faxed to both [facility] [place] this pm at 4:45pm.  FL2 copy sent to be scanned in to patients chart.  Thanks.

## 2017-09-30 NOTE — Telephone Encounter (Signed)
Letter is in your inbox

## 2017-09-30 NOTE — Telephone Encounter (Signed)
Dr. Glori Bickers read the letter pt's daughter dropped of and she has done the order for the bed and I faxed it to [facility]

## 2017-09-30 NOTE — Telephone Encounter (Signed)
Please bring it back and I will work on it - then she can pick that up with fmla together  Thanks

## 2017-09-30 NOTE — Telephone Encounter (Signed)
Order faxed, [name] will advise daughter

## 2017-10-05 ENCOUNTER — Other Ambulatory Visit: Payer: Self-pay | Admitting: Family Medicine

## 2017-10-11 ENCOUNTER — Telehealth: Payer: Self-pay | Admitting: Family Medicine

## 2017-10-11 NOTE — Telephone Encounter (Signed)
Please go ahead and get [place] and culture  Thanks

## 2017-10-11 NOTE — Telephone Encounter (Signed)
Copied from [place] [id number]. Topic: Quick Communication - See Telephone Encounter >> Oct 11, 2017  8:49 AM Oneta Rack wrote: CRM for notification. See Telephone encounter for:   10/11/17.  Caller name: Rosanne Ashing  Relation to pt:  Hospice Nurse  Call back number: [phone]   Reason for call:   Nurse wanted to inform PCP patient will be discharge from services on Thursday 10/13/17. Nurse would like to collect urine due to patient urine being dark, odor & hallucinating. Nurse states if any objection please call if not nurse will proceed to collect. Please advise

## 2017-10-12 ENCOUNTER — Other Ambulatory Visit: Payer: Self-pay | Admitting: Family Medicine

## 2017-10-14 ENCOUNTER — Ambulatory Visit: Payer: PPO | Admitting: Family Medicine

## 2017-10-14 DIAGNOSIS — I5043 Acute on chronic combined systolic (congestive) and diastolic (congestive) heart failure: Secondary | ICD-10-CM | POA: Diagnosis not present

## 2017-10-14 DIAGNOSIS — I5042 Chronic combined systolic (congestive) and diastolic (congestive) heart failure: Secondary | ICD-10-CM | POA: Diagnosis not present

## 2017-10-14 DIAGNOSIS — I4891 Unspecified atrial fibrillation: Secondary | ICD-10-CM | POA: Diagnosis not present

## 2017-10-19 ENCOUNTER — Encounter: Payer: Self-pay | Admitting: Family Medicine

## 2017-10-19 ENCOUNTER — Ambulatory Visit (INDEPENDENT_AMBULATORY_CARE_PROVIDER_SITE_OTHER): Payer: PPO | Admitting: Family Medicine

## 2017-10-19 VITALS — BP 130/68 | HR 74 | Temp 97.8°F

## 2017-10-19 DIAGNOSIS — M810 Age-related osteoporosis without current pathological fracture: Secondary | ICD-10-CM | POA: Diagnosis not present

## 2017-10-19 DIAGNOSIS — I4819 Other persistent atrial fibrillation: Secondary | ICD-10-CM

## 2017-10-19 DIAGNOSIS — J449 Chronic obstructive pulmonary disease, unspecified: Secondary | ICD-10-CM

## 2017-10-19 DIAGNOSIS — I872 Venous insufficiency (chronic) (peripheral): Secondary | ICD-10-CM | POA: Diagnosis not present

## 2017-10-19 DIAGNOSIS — I10 Essential (primary) hypertension: Secondary | ICD-10-CM | POA: Diagnosis not present

## 2017-10-19 DIAGNOSIS — E039 Hypothyroidism, unspecified: Secondary | ICD-10-CM

## 2017-10-19 DIAGNOSIS — D518 Other vitamin B12 deficiency anemias: Secondary | ICD-10-CM | POA: Diagnosis not present

## 2017-10-19 DIAGNOSIS — R627 Adult failure to thrive: Secondary | ICD-10-CM

## 2017-10-19 DIAGNOSIS — I481 Persistent atrial fibrillation: Secondary | ICD-10-CM

## 2017-10-19 DIAGNOSIS — F05 Delirium due to known physiological condition: Secondary | ICD-10-CM

## 2017-10-19 DIAGNOSIS — I5043 Acute on chronic combined systolic (congestive) and diastolic (congestive) heart failure: Secondary | ICD-10-CM

## 2017-10-19 DIAGNOSIS — D696 Thrombocytopenia, unspecified: Secondary | ICD-10-CM | POA: Diagnosis not present

## 2017-10-19 DIAGNOSIS — N179 Acute kidney failure, unspecified: Secondary | ICD-10-CM

## 2017-10-19 DIAGNOSIS — E7800 Pure hypercholesterolemia, unspecified: Secondary | ICD-10-CM | POA: Diagnosis not present

## 2017-10-19 DIAGNOSIS — I272 Pulmonary hypertension, unspecified: Secondary | ICD-10-CM

## 2017-10-19 LAB — COMPREHENSIVE METABOLIC PANEL
ALK PHOS: 51 U/L (ref 39–117)
ALT: 6 U/L (ref 0–35)
AST: 14 U/L (ref 0–37)
Albumin: 3.8 g/dL (ref 3.5–5.2)
BUN: 15 mg/dL (ref 6–23)
CHLORIDE: 103 meq/L (ref 96–112)
CO2: 34 meq/L — AB (ref 19–32)
Calcium: 9 mg/dL (ref 8.4–10.5)
Creatinine, Ser: 1.01 mg/dL (ref 0.40–1.20)
GFR: 54.24 mL/min — AB (ref >60.00)
Glucose, Bld: 90 mg/dL (ref 70–99)
POTASSIUM: 4.4 meq/L (ref 3.5–5.1)
SODIUM: 140 meq/L (ref 135–145)
TOTAL PROTEIN: 6.2 g/dL (ref 6.0–8.3)
Total Bilirubin: 0.7 mg/dL (ref 0.2–1.2)

## 2017-10-19 LAB — CBC WITH DIFFERENTIAL/PLATELET
BASOS PCT: 0.9 % (ref 0.0–3.0)
Basophils Absolute: 0 10*3/uL (ref 0.0–0.1)
EOS PCT: 2.3 % (ref 0.0–5.0)
Eosinophils Absolute: 0.1 10*3/uL (ref 0.0–0.7)
HCT: 36 % (ref 36.0–46.0)
Hemoglobin: 12 g/dL (ref 12.0–15.0)
LYMPHS ABS: 1.2 10*3/uL (ref 0.7–4.0)
Lymphocytes Relative: 22.7 % (ref 12.0–46.0)
MCHC: 33.3 g/dL (ref 30.0–36.0)
MCV: 94 fl (ref 78.0–100.0)
MONO ABS: 0.6 10*3/uL (ref 0.1–1.0)
MONOS PCT: 11.6 % (ref 3.0–12.0)
NEUTROS ABS: 3.3 10*3/uL (ref 1.4–7.7)
NEUTROS PCT: 62.5 % (ref 43.0–77.0)
PLATELETS: 153 10*3/uL (ref 150.0–400.0)
RBC: 3.83 Mil/uL — ABNORMAL LOW (ref 3.87–5.11)
RDW: 14.8 % (ref 11.5–15.5)
WBC: 5.2 10*3/uL (ref 4.0–10.5)

## 2017-10-19 LAB — LIPID PANEL
Cholesterol: 133 mg/dL (ref 0–200)
HDL: 49.4 mg/dL (ref >39.00)
LDL CALC: 72 mg/dL (ref 0–99)
NonHDL: 83.16
Total CHOL/HDL Ratio: 3
Triglycerides: 55 mg/dL (ref 0.0–149.0)
VLDL: 11 mg/dL (ref 0.0–40.0)

## 2017-10-19 LAB — VITAMIN D 25 HYDROXY (VIT D DEFICIENCY, FRACTURES): VITD: 44.65 ng/mL (ref 30.00–100.00)

## 2017-10-19 LAB — VITAMIN B12: Vitamin B-12: 222 pg/mL (ref 211–911)

## 2017-10-19 LAB — TSH: TSH: 4.38 u[IU]/mL (ref 0.35–4.50)

## 2017-10-19 NOTE — Progress Notes (Signed)
Subjective:    Patient ID: [name], female    DOB: 1922-04-30, 84 y.o.   MRN: [id number]  HPI Here for f/u after d/c from hospice on 1/31 Daughter is working on finding a facility  Working though concerns and questions   Has sitter 7 days per week now which is helpful  Has some days off  Still at home with her daughter    Wants [place] to check feet  Hospice nurse said she had "bruised heels" from pushing up to sitting in bed Has some velcro shoes help with that     Wt Readings from Last 3 Encounters:  05/09/17 118 lb 1.6 oz (53.6 kg)  05/05/17 114 lb 2 oz (51.8 kg)  04/15/17 117 lb 1 oz (53.1 kg)  weight looks stable at home (w/c and unable to weight today)  Fixes limas/corn bread/cabbage/potato soup  Likes pb and pintos   Fluid intake - some coffee/buttermilk and water  Has had recurrent utis     Lab Results  Component Value Date   CREATININE 1.85 (H) 05/08/2017   BUN 47 (H) 05/08/2017   NA 140 05/08/2017   K 4.5 05/08/2017   CL 102 05/08/2017   CO2 31 05/08/2017   Lab Results  Component Value Date   ALT 19 04/09/2017   AST 34 04/09/2017   ALKPHOS 71 04/09/2017   BILITOT 0.9 04/09/2017   Lab Results  Component Value Date   WBC 9.1 05/08/2017   HGB 9.0 (L) 05/09/2017   HCT 28.2 (L) 05/08/2017   MCV 91.4 05/08/2017   PLT 80 (L) 05/08/2017  low B12 in the past Lab Results  Component Value Date   VITAMINB12 350 02/04/2017    Lab Results  Component Value Date   TSH 3.756 04/08/2015    Pneumonia vaccines - she family [facility] (will sign a release to send for those)  On wt list for the shingrix vaccine   CHF with hx of a fib and pulm HTN  Daily weights  Plans to keep up with cardiology   Wants to do her lab work today   A lot of chronic pain issues as well as night time confusion Hydrocodone -if she does not take at night she has hallucinations  ? If withdrawing from that  Ativan does not work as well    Would like to come off of the hydrocodone gradually  Only taking one pill at night  Pain is well controlled  Sleeps fairly well at night  Talks in her sleep/ vivid dreams    bp is stable today  No cp or palpitations or headaches or edema  No side effects to medicines  BP Readings from Last 3 Encounters:  10/19/17 130/68  05/10/17 (!) 117/57  05/05/17 104/65      Pulse Readings from Last 3 Encounters:  10/19/17 74  05/10/17 86  05/05/17 (!) 105    Pedal edema Multifactorial Has venous insufficiency   Hypothyroidism  Pt has no clinical changes No change in energy level/ hair or skin/ edema and no tremor Lab Results  Component Value Date   TSH 3.756 04/08/2015      H/o hyperlipidemia Lab Results  Component Value Date   CHOL 147 05/12/2015   HDL 46.00 05/12/2015   LDLCALC 83 05/12/2015   TRIG 91.0 05/12/2015   CHOLHDL 3 05/12/2015   Patient Active Problem List   Diagnosis Date Noted  . Pelvic fracture ([place]) 05/07/2017  .  COPD (chronic obstructive pulmonary disease) ([place]) 05/03/2017  . Palliative care encounter   . Acute respiratory distress 04/11/2017  . AKI (acute kidney injury) ([place]) 04/11/2017  . Hypomagnesemia 04/11/2017  . Pressure injury of skin 04/08/2017  . Cough 04/07/2017  . Wheezing 04/07/2017  . H/O: pneumonia 04/07/2017  . Muscle weakness (generalized)   . Unsteadiness on feet   . Arterial hypotension   . Adjustment disorder with mixed anxiety and depressed mood 06/09/2015  . Generalized weakness 05/12/2015  . Chronic systolic CHF (congestive heart failure) ([place]) 04/29/2015  . Cardiomyopathy, ischemic   . Pulmonary hypertension ([place])   . Adult failure to thrive   . Unstable angina ([place]) 04/07/2015  . Venous stasis dermatitis 06/19/2014  . Orthostatic hypotension   . Acute on chronic combined systolic (congestive) and diastolic (congestive) heart failure ([place]) 03/02/2013  . History of shingles 02/28/2013  . Nail fungus 01/10/2013  .  Hyperlipidemia 07/09/2011  . Hearing loss 07/09/2011  . Weight loss   . A-fib ([place])   . CAD in native artery   . Mitral regurgitation   . LBBB (left bundle branch block)   . Warfarin anticoagulation   . TR (tricuspid regurgitation)   . Dyslipidemia   . Prolonged QT interval   . Coccydynia 12/03/2010  . Nonspecific (abnormal) findings on radiological and other examination of body structure 07/13/2010  . ABNORMAL CHEST XRAY 07/13/2010  . Shortness of breath 06/18/2010  . Essential hypertension 06/24/2009  . GOUT, UNSPECIFIED 03/20/2009  . GERD 03/18/2009  . Thrombocytopenia ([place]) 01/03/2009  . ALLERGIC RHINITIS 12/05/2008  . HOARSENESS 06/07/2008  . MEDIASTINAL LYMPHADENOPATHY 05/13/2008  . Other diseases of lung, not elsewhere classified 04/18/2008  . Venous (peripheral) insufficiency 09/13/2007  . FREQUENCY, URINARY 07/19/2007  . TINNITUS NOS 05/11/2007  . Osteoporosis 05/11/2007  . Hypothyroidism 04/11/2007  . ANEMIA, B12 DEFICIENCY 04/11/2007   Past Medical History:  Diagnosis Date  . Allergy   . Anemia   . Arthritis    osteo  . CAD (coronary artery disease)    a. 2 DES, [facility], 2004; b. nuc 2013 septal dyssenergy 2/2 LBBB, nl LV fxn;  c. 06/2015 MV: EF 41%, septal HK (LBBB), no ischemia->Low risk.  . Chronic atrial fibrillation (HCC)    a. on Coumadin (CHA2DS2VASc = 6); b. Holter 06/2010, no brady, mild tachy.  . Chronic systolic CHF (congestive heart failure) ([place])    a. 03/2015 Echo: EF 30-35%;  b. 01/2016 Echo: EF 30-35%.  . Compression fracture of lumbar vertebra ([place]) 05-2008  . COPD (chronic obstructive pulmonary disease) ([place])   . Diverticulosis of colon   . Dyslipidemia   . Essential hypertension   . Family history of adverse reaction to anesthesia    daughter PONV  . GERD (gastroesophageal reflux disease)   . Hard of hearing   . Hematemesis    a. 03/2015.  [name] Hx of colonoscopy   . Hypothyroid   . Ischemic cardiomyopathy    a. 03/2015 Echo: EF 30-35%, ant,  antsept HK, mod MR, mildly dil LA/RV, mod dil RA, mod-sev TR, PASP 65mmHg;  b. 01/2016 Echo: EF 30-35%, ant/antsept HK, mildly dil Ao root (3.4cm) and Asc Ao (3.5cm), mild [email or web address] bi-atrial enlargement, mild to mod TR. PASP 28mmHg.  [name] LBBB (left bundle branch block)   . Lung abnormality    Dr [name] 122/2011 no further w/u needed  . Mitral regurgitation   . Myocardial infarction [facility]) 2016   "mild heart attack"  . Nausea  Some nausea with medications, May, 2013,  . Nocturia   . OA (osteoarthritis)   . Orthostatic hypotension    June, 2014  . Osteoporosis   . Pleural effusion associated with pulmonary infection 03-2007  . Prolonged QT interval    when on sotolol in past  . Pulmonary hypertension ([employer])    a. 03/2015 PASP 35mmHg by echo.  . Shingles 2014  . Thrombocytopenia ([place])   . TR (tricuspid regurgitation)    moderate, echo 2008  . Urinary frequency   . Vitamin B deficiency   . Warfarin anticoagulation   . Wears dentures   . Wears glasses   . Weight loss    August, 2012   Past Surgical History:  Procedure Laterality Date  . COLONOSCOPY    . CORONARY ANGIOPLASTY  2004  . LUMBAR LAMINECTOMY/DECOMPRESSION MICRODISCECTOMY N/A 03/23/2016   Procedure: Lumbar three-four Laminectomy/Foraminotomy, posterior laterial arthrodesis;  Surgeon: Leeroy Cha, MD;  Location: [facility] NEURO ORS;  Service: Neurosurgery;  Laterality: N/A;  L3-4 Laminectomy/Foraminotomy  . THYROIDECTOMY  1962  . TONSILLECTOMY     Social History   Tobacco Use  . Smoking status: Former Smoker    Types: Cigarettes    Last attempt to quit: 09/14/1947    Years since quitting: 70.1  . Smokeless tobacco: Never Used  Substance Use Topics  . Alcohol use: No    Alcohol/week: 0.0 oz  . Drug use: No   Family History  Problem Relation Age of Onset  . Heart disease Mother 74  . Stroke Father 44  . Heart disease Sister   . Cancer Brother        colon  . Diabetes Brother   . Leukemia Sister    Allergies    Allergen Reactions  . Sulfonamide Derivatives Nausea And Vomiting    REACTION: sick  . Alendronate Sodium Other (See Comments)    REACTION: GI  . Prednisone Palpitations   Current Outpatient Medications on File Prior to Visit  Medication Sig Dispense Refill  . Chlorpheniramine-Phenylephrine 4-10 MG tablet Take 1 tablet by mouth once. Take 1 pill every am for sinus/allergies.    [name] docusate sodium (COLACE) 100 MG capsule Take 100 mg by mouth at bedtime.     . fluticasone (FLONASE) 50 MCG/ACT nasal spray 2 SPRAYS IN EACH NOSTRIL EVERY DAY 16 g 5  . Glucosamine HCl 500 MG TABS Take 500 mg by mouth daily.     [name] HYDROcodone-acetaminophen (NORCO/VICODIN) 5-325 MG tablet Take 1-2 tablets by mouth every 6 (six) hours as needed for moderate pain. HOSPICE PATIENT 60 tablet 0  . levothyroxine (SYNTHROID, LEVOTHROID) 88 MCG tablet TAKE ONE TABLET BY MOUTH EVERY DAY 90 tablet 1  . metoprolol tartrate (LOPRESSOR) 25 MG tablet Take 0.5 tablets (12.5 mg total) by mouth 2 (two) times daily. (Patient taking differently: Take 12.5 mg by mouth 2 (two) times daily. HOLD evening dose if BP is under 110) 180 tablet 3  . midodrine (PROAMATINE) 5 MG tablet TAKE ONE TABLET BY MOUTH TWICE DAILY WITH MEALS 180 tablet 3  . mirtazapine (REMERON) 15 MG tablet TAKE ONE TABLET AT BEDTIME 90 tablet 3  . multivitamin-lutein (OCUVITE-LUTEIN) CAPS capsule Take 1 capsule by mouth daily.    . nitroGLYCERIN (NITROSTAT) 0.4 MG SL tablet Place 0.4 mg under the tongue every 5 (five) minutes as needed for chest pain (up to a max of 3).    . pantoprazole (PROTONIX) 40 MG tablet TAKE ONE TABLET BY MOUTH EVERY DAY 90 tablet 1  .  Polyethyl Glycol-Propyl Glycol (SYSTANE OP) Apply 1 drop to eye daily.    . polyethylene glycol (MIRALAX / GLYCOLAX) packet Take 17 g by mouth daily. 14 each 0  . senna (SENOKOT) 8.6 MG tablet Take 1 tablet by mouth daily.    . simvastatin (ZOCOR) 20 MG tablet TAKE 1/2 TABLET BY MOUTH EVERY EVENING 45 tablet 1   . torsemide (DEMADEX) 20 MG tablet Take 20 mg by mouth daily as needed. Takes 1 pill if gains 5lbs or more in week or 1 pill if 2lbs or greater overnight.     Current Facility-Administered Medications on File Prior to Visit  Medication Dose Route Frequency Provider Last Rate Last Dose  . cyanocobalamin ((VITAMIN B-12)) injection 1,000 mcg  1,000 mcg Intramuscular Q30 days Tower, Wynelle Fanny, MD   1,000 mcg at 04/05/17 1100     Review of Systems  Constitutional: Positive for fatigue. Negative for activity change, appetite change, fever and unexpected weight change.  HENT: Negative for congestion, ear pain, postnasal drip, rhinorrhea, sinus pressure and sore throat.        Baseline hoarse  Eyes: Negative for pain, redness and visual disturbance.  Respiratory: Negative for cough, chest tightness, shortness of breath, wheezing and stridor.   Cardiovascular: Positive for leg swelling. Negative for chest pain and palpitations.       Pedal edema comes and goes   Gastrointestinal: Negative for abdominal pain, blood in stool, constipation and diarrhea.  Endocrine: Negative for polydipsia and polyuria.  Genitourinary: Negative for decreased urine volume, dysuria, frequency, hematuria, pelvic pain and urgency.       No uti symptoms currently  Musculoskeletal: Negative for arthralgias, back pain and myalgias.  Skin: Negative for pallor and rash.  Allergic/Immunologic: Negative for environmental allergies.  Neurological: Negative for dizziness, syncope, light-headedness, numbness and headaches.  Hematological: Negative for adenopathy. Does not bruise/bleed easily.  Psychiatric/Behavioral: Positive for confusion and hallucinations. Negative for decreased concentration, dysphoric mood and sleep disturbance. The patient is not nervous/anxious.        Some pm confusion and hallucinations        Objective:   Physical Exam  Constitutional: She appears well-developed and well-nourished. No distress.  Frail  appearing underweight elderly female in wheelchair   HENT:  Head: Normocephalic and atraumatic.  Right Ear: External ear normal.  Left Ear: External ear normal.  Mouth/Throat: Oropharynx is clear and moist. No oropharyngeal exudate.  Mucous membranes are borderline dry  Baseline hoarse voice   Eyes: Conjunctivae and EOM are normal. Pupils are equal, round, and reactive to light. Right eye exhibits no discharge. Left eye exhibits no discharge. No scleral icterus.  Neck: Normal range of motion. Neck supple. No JVD present. Carotid bruit is not present. No thyromegaly present.  Cardiovascular: Normal rate, regular rhythm, normal heart sounds and intact distal pulses. Exam reveals no gallop.  Pulmonary/Chest: Effort normal and breath sounds normal. No respiratory distress. She has no wheezes. She has no rales. She exhibits no tenderness.  No crackles  Abdominal: Soft. Bowel sounds are normal. She exhibits no distension, no abdominal bruit and no mass. There is no tenderness. There is no rebound and no guarding.  Musculoskeletal: She exhibits edema and tenderness.  Severe kyphosis  Poor rom of spine   Tenderness in spine and knees/hands   Mild pedal edema   Lymphadenopathy:    She has no cervical adenopathy.  Neurological: She is alert. She has normal reflexes. No cranial nerve deficit. She exhibits normal muscle tone. Coordination  normal.  Intermittent tremor  Skin: Skin is warm and dry. No rash noted. No erythema. No pallor.  Psychiatric: She has a normal mood and affect. Her mood appears not anxious. Her speech is delayed. She does not exhibit a depressed mood.  Mood is normal  Answers questions appropriately- with some delay in responses          Assessment & Plan:   Problem List Items Addressed This Visit      Cardiovascular and Mediastinum   A-fib ([place])    Rate controlled  No longer on anticoag given risks Planning cardiology f/u      Acute on chronic combined systolic  (congestive) and diastolic (congestive) heart failure (HCC) - Primary    Stable now  Previously under care of hospice Labs today  Planning f/u with cardiology      Essential hypertension    bp in fair control at this time  BP Readings from Last 1 Encounters:  10/19/17 130/68   No changes needed Disc lifstyle change with low sodium diet and exercise  Lab today      Relevant Orders   CBC with Differential/Platelet (Completed)   Comprehensive metabolic panel (Completed)   Lipid panel (Completed)   TSH (Completed)   Pulmonary hypertension ([place])    Stable since released from hospice  Sob is unchanged       Venous (peripheral) insufficiency (Chronic)    Recommend supp stockings when able and leg elevation  No ulcerations  Good arterial pulses noted         Respiratory   COPD (chronic obstructive pulmonary disease) (HCC)    No clinical changes         Endocrine   Hypothyroidism    TSH today  No clinical changes on 88 mcg of levothy      Relevant Orders   TSH (Completed)     Musculoskeletal and Integument   Osteoporosis    Will check D level with labs today  Has hx of multiple fractures Does not desire aggressive tx      Relevant Orders   VITAMIN D 25 Hydroxy (Vit-D Deficiency, Fractures) (Completed)     Genitourinary   AKI (acute kidney injury) ([place])    Cr of 1.85 with last hosp Labs today  Not taking in much fluid- disc imp of this for renal health and uti prev (having frequent utis)        Other   Adult failure to thrive    Appetite is slightly improved now that she is stable  Recommend calorie supplementation when able       ANEMIA, B12 DEFICIENCY    B12 level today      Relevant Orders   Vitamin B12 (Completed)   Hyperlipidemia    Labs today  Disc goals for lipids and reasons to control them Rev labs with pt Rev low sat fat diet in detail In setting of CAD On zocor       Relevant Orders   Lipid panel (Completed)   Sundowning     Confusion at night and some hallucinations  Her norco may worsen this  Will work on weaning this gradually as her chronic pain is currently improved        Thrombocytopenia ([place])    Labs today  No new bruising or bleeding       Relevant Orders   CBC with Differential/Platelet (Completed)

## 2017-10-19 NOTE — Patient Instructions (Addendum)
Cut the the norco (hydrocodone-apap) to 1/2 pill each bedtime for 2 weeks and then cut into 1/4 - and take 1/4 pill each bedtime for 1-2 weeks and then stop  Let me know how it goes   Goal is 64 oz of fluid daily = mostly water  Work towards it to prevent utis   Stay engaged and socialize when you are awake   Labs today   Never walk without assistance

## 2017-10-20 DIAGNOSIS — F05 Delirium due to known physiological condition: Secondary | ICD-10-CM | POA: Insufficient documentation

## 2017-10-20 NOTE — Assessment & Plan Note (Signed)
B12 level today  

## 2017-10-20 NOTE — Assessment & Plan Note (Signed)
Stable now  Previously under care of hospice Labs today  Planning f/u with cardiology

## 2017-10-20 NOTE — Assessment & Plan Note (Signed)
Stable since released from hospice  Sob is unchanged

## 2017-10-20 NOTE — Assessment & Plan Note (Signed)
Appetite is slightly improved now that she is stable  Recommend calorie supplementation when able

## 2017-10-20 NOTE — Assessment & Plan Note (Signed)
Rate controlled  No longer on anticoag given risks Planning cardiology f/u

## 2017-10-20 NOTE — Assessment & Plan Note (Signed)
TSH today  No clinical changes on 88 mcg of levothy

## 2017-10-20 NOTE — Assessment & Plan Note (Signed)
Confusion at night and some hallucinations  Her norco may worsen this  Will work on weaning this gradually as her chronic pain is currently improved

## 2017-10-20 NOTE — Assessment & Plan Note (Signed)
No clinical changes 

## 2017-10-20 NOTE — Assessment & Plan Note (Signed)
Recommend supp stockings when able and leg elevation  No ulcerations  Good arterial pulses noted

## 2017-10-20 NOTE — Assessment & Plan Note (Signed)
Labs today  No new bruising or bleeding

## 2017-10-20 NOTE — Assessment & Plan Note (Signed)
bp in fair control at this time  BP Readings from Last 1 Encounters:  10/19/17 130/68   No changes needed Disc lifstyle change with low sodium diet and exercise  Lab today

## 2017-10-20 NOTE — Assessment & Plan Note (Signed)
Labs today  Disc goals for lipids and reasons to control them Rev labs with pt Rev low sat fat diet in detail In setting of CAD On zocor

## 2017-10-20 NOTE — Assessment & Plan Note (Signed)
Cr of 1.85 with last hosp Labs today  Not taking in much fluid- disc imp of this for renal health and uti prev (having frequent utis)

## 2017-10-20 NOTE — Assessment & Plan Note (Signed)
Will check D level with labs today  Has hx of multiple fractures Does not desire aggressive tx

## 2017-10-24 ENCOUNTER — Other Ambulatory Visit: Payer: Self-pay | Admitting: *Deleted

## 2017-10-24 ENCOUNTER — Telehealth: Payer: Self-pay | Admitting: *Deleted

## 2017-10-24 DIAGNOSIS — D518 Other vitamin B12 deficiency anemias: Secondary | ICD-10-CM

## 2017-10-24 NOTE — Telephone Encounter (Signed)
Done and in IN box 

## 2017-10-24 NOTE — Telephone Encounter (Signed)
Copied from [place]. Topic: General - Other >> Oct 24, 2017 11:21 AM Margot Ables wrote: Reason for CRM: pt will be moving to [facility] and they advised her they need an updated FL2 form.  Pt daughter we have it on file.   [place] Cell [phone] [email or web address]  Caller name: [name] Relationship to patient: daughter Can be reached: [phone]

## 2017-10-25 ENCOUNTER — Ambulatory Visit (INDEPENDENT_AMBULATORY_CARE_PROVIDER_SITE_OTHER): Payer: PPO

## 2017-10-25 DIAGNOSIS — E538 Deficiency of other specified B group vitamins: Secondary | ICD-10-CM

## 2017-10-25 MED ORDER — CYANOCOBALAMIN 1000 MCG/ML IJ SOLN
1000.0000 ug | Freq: Once | INTRAMUSCULAR | Status: AC
  Administered 2017-10-25: 1000 ug via INTRAMUSCULAR

## 2017-10-25 NOTE — Telephone Encounter (Signed)
Daughter notified form ready for pick up 

## 2017-11-11 DIAGNOSIS — I5042 Chronic combined systolic (congestive) and diastolic (congestive) heart failure: Secondary | ICD-10-CM | POA: Diagnosis not present

## 2017-11-11 DIAGNOSIS — I4891 Unspecified atrial fibrillation: Secondary | ICD-10-CM | POA: Diagnosis not present

## 2017-11-11 DIAGNOSIS — I5043 Acute on chronic combined systolic (congestive) and diastolic (congestive) heart failure: Secondary | ICD-10-CM | POA: Diagnosis not present

## 2017-11-15 ENCOUNTER — Emergency Department: Payer: PPO

## 2017-11-15 ENCOUNTER — Ambulatory Visit: Payer: Self-pay | Admitting: *Deleted

## 2017-11-15 ENCOUNTER — Emergency Department
Admission: EM | Admit: 2017-11-15 | Discharge: 2017-11-15 | Disposition: A | Discharge status: Home / Self Care | Payer: PPO | Attending: Emergency Medicine | Admitting: Emergency Medicine

## 2017-11-15 ENCOUNTER — Telehealth: Payer: Self-pay | Admitting: Family Medicine

## 2017-11-15 ENCOUNTER — Other Ambulatory Visit: Payer: Self-pay

## 2017-11-15 DIAGNOSIS — Z87891 Personal history of nicotine dependence: Secondary | ICD-10-CM | POA: Insufficient documentation

## 2017-11-15 DIAGNOSIS — R443 Hallucinations, unspecified: Secondary | ICD-10-CM

## 2017-11-15 DIAGNOSIS — R441 Visual hallucinations: Secondary | ICD-10-CM | POA: Diagnosis not present

## 2017-11-15 DIAGNOSIS — I2510 Atherosclerotic heart disease of native coronary artery without angina pectoris: Secondary | ICD-10-CM | POA: Diagnosis not present

## 2017-11-15 DIAGNOSIS — Z79899 Other long term (current) drug therapy: Secondary | ICD-10-CM | POA: Diagnosis not present

## 2017-11-15 DIAGNOSIS — R4182 Altered mental status, unspecified: Secondary | ICD-10-CM | POA: Diagnosis not present

## 2017-11-15 DIAGNOSIS — N3001 Acute cystitis with hematuria: Secondary | ICD-10-CM | POA: Insufficient documentation

## 2017-11-15 DIAGNOSIS — J449 Chronic obstructive pulmonary disease, unspecified: Secondary | ICD-10-CM | POA: Insufficient documentation

## 2017-11-15 DIAGNOSIS — R440 Auditory hallucinations: Secondary | ICD-10-CM | POA: Insufficient documentation

## 2017-11-15 DIAGNOSIS — I10 Essential (primary) hypertension: Secondary | ICD-10-CM | POA: Diagnosis not present

## 2017-11-15 DIAGNOSIS — I5022 Chronic systolic (congestive) heart failure: Secondary | ICD-10-CM | POA: Diagnosis not present

## 2017-11-15 LAB — COMPREHENSIVE METABOLIC PANEL WITH GFR
ALT: 8 U/L — ABNORMAL LOW (ref 14–54)
AST: 20 U/L (ref 15–41)
Albumin: 3.9 g/dL (ref 3.5–5.0)
Alkaline Phosphatase: 53 U/L (ref 38–126)
Anion gap: 8 (ref 5–15)
BUN: 18 mg/dL (ref 6–20)
CO2: 27 mmol/L (ref 22–32)
Calcium: 9.1 mg/dL (ref 8.9–10.3)
Chloride: 104 mmol/L (ref 101–111)
Creatinine, Ser: 1.01 mg/dL — ABNORMAL HIGH (ref 0.44–1.00)
GFR calc Af Amer: 53 mL/min — ABNORMAL LOW
GFR calc non Af Amer: 46 mL/min — ABNORMAL LOW
Glucose, Bld: 88 mg/dL (ref 65–99)
Potassium: 4.4 mmol/L (ref 3.5–5.1)
Sodium: 139 mmol/L (ref 135–145)
Total Bilirubin: 0.7 mg/dL (ref 0.3–1.2)
Total Protein: 6.7 g/dL (ref 6.5–8.1)

## 2017-11-15 LAB — URINALYSIS, COMPLETE (UACMP) WITH MICROSCOPIC
BACTERIA UA: NONE SEEN
Bilirubin Urine: NEGATIVE
Glucose, UA: NEGATIVE mg/dL
Hgb urine dipstick: NEGATIVE
Ketones, ur: NEGATIVE mg/dL
Nitrite: NEGATIVE
PROTEIN: NEGATIVE mg/dL
Specific Gravity, Urine: 1.015 (ref 1.005–1.030)
pH: 5 (ref 5.0–8.0)

## 2017-11-15 LAB — CBC
HCT: 37.8 % (ref 35.0–47.0)
Hemoglobin: 12.2 g/dL (ref 12.0–16.0)
MCH: 30.6 pg (ref 26.0–34.0)
MCHC: 32.3 g/dL (ref 32.0–36.0)
MCV: 94.9 fL (ref 80.0–100.0)
PLATELETS: 120 10*3/uL — AB (ref 150–440)
RBC: 3.98 MIL/uL (ref 3.80–5.20)
RDW: 15.2 % — AB (ref 11.5–14.5)
WBC: 5 10*3/uL (ref 3.6–11.0)

## 2017-11-15 LAB — T4, FREE: Free T4: 1.12 ng/dL (ref 0.61–1.12)

## 2017-11-15 LAB — TSH: TSH: 3 u[IU]/mL (ref 0.350–4.500)

## 2017-11-15 MED ORDER — CEPHALEXIN 500 MG PO CAPS: 500.0000 mg | ORAL_CAPSULE | Freq: Three times a day (TID) | ORAL | 0 refills | Status: AC

## 2017-11-15 MED ORDER — CEPHALEXIN 500 MG PO CAPS
500.0000 mg | ORAL_CAPSULE | Freq: Once | ORAL | Status: AC
  Administered 2017-11-15: 500 mg via ORAL
  Filled 2017-11-15: qty 1

## 2017-11-15 NOTE — Telephone Encounter (Signed)
Pt said that the delirium and confusion are off and on and she still wants to speak with Dr. Glori Bickers directly before she takes pt to the ER, she thinks that may frighten her even more

## 2017-11-15 NOTE — Telephone Encounter (Signed)
Pt's daughter called to report increased confusion onset this AM.  Confused, hallucinations. Directed to ED. Daughter requesting call from office to see if Dr. Glori Bickers agrees. Triage encounter ended as daughter was tending to pt's needs presently. Routed to office high priority, Rena notified via skype. Daughter's number [phone] [name])  Reason for Disposition . [1] Longstanding confusion (e.g., dementia, stroke) AND [2] worsening  Answer Assessment - Initial Assessment Questions 1. LEVEL OF CONSCIOUSNESS: "How is he (she, the patient) acting right now?" (e.g., alert-oriented, confused, lethargic, stuporous, comatose)     Confused, hallucinating 2. ONSET: "When did the confusion start?"  (minutes, hours, days)     4 am 3. PATTERN "Does this come and go, or has it been constant since it started?"  "Is it present now?"     Constant 4. ALCOHOL or DRUGS: "Has he been drinking alcohol or taking any drugs?"       5. NARCOTIC MEDICATIONS: "Has he been receiving any narcotic medications?" (e.g., morphine, Vicodin)      6. CAUSE: "What do you think is causing the confusion?"       7. OTHER SYMPTOMS: "Are there any other symptoms?" (e.g., difficulty breathing, headache, fever, weakness)  Protocols used: CONFUSION - DELIRIUM-A-AH

## 2017-11-15 NOTE — Telephone Encounter (Signed)
I spoke to her -she is acutely confused today (delerium) which is more concerning (not her usual) Is taking her to [facility] ED now

## 2017-11-15 NOTE — ED Notes (Signed)
Pt fell and broke her pelvis on 05/07/2017.  Pt was then on hospice and took oxycodone.  Pt was released from hospice on 10/13/2017.  Pt has been weaning off of her oxycodone slowly (initially she was taking 1tab in the pm, then she took 1/2 tab for a week or 2, now she is on 1/4tab in the pm).  Pt has been more confused, she has been hallucinating, she is seeing departed family members.  Family state that she is living with her daughter and that she had problems in the past while on hospice when she did not get her oxycodone and she had frequent UTI's.  Pt has been having frequent urination (uses bedside commode).  Pt is oriented to self and knows family

## 2017-11-15 NOTE — Telephone Encounter (Signed)
Called [facility] and advise Triage nurse pt is on her way

## 2017-11-15 NOTE — Telephone Encounter (Signed)
Yes-if she is more confused acutely (delerium) do please take her to the ED

## 2017-11-15 NOTE — Telephone Encounter (Signed)
I spoke to her  The hallucinations at night are baseline  However the day time confusion and delerium are new and more concerning I adv she take her to the ED for eval (she chose [facility]) Please call triage there and let them know she is coming - for confusion/delerium

## 2017-11-15 NOTE — ED Provider Notes (Signed)
[facility] Emergency Department Provider Note  ____________________________________________  Time seen: Approximately 7:03 PM  I have reviewed the triage vital signs and the nursing notes.   HISTORY  Chief Complaint Hallucinations   HPI [name] is a 84 y.o. female with a history of CAD, anemia, atrial fibrillation not on anticoagulation, CHF, COPD, hypertension, CHF, thrombocytopenia who presents for evaluation of hallucinations. Patient tells me that she has been having hallucinations for a couple of weeks. She hears music all day long in her ears. She is also seeing people that aren't there.She does have a history of very vivid dreams at night however that is chronic for her. The daughter reports the patient had a pelvic fracture in August and at that time was put on hydrocodone. The hydrocodone helped suppress the vivid dreams and also helped with her pain and so family continued on giving her the medication daily for several months. She then developed pneumonia and was in rehabilitation and while at rehabilitation the hydrocodone was held causing the patient to start having severe day and night hallucinations. She was then put back on hydrocodone by the primary care doctor who recommended weaning her off slowly. A few weeks ago family started weaning her off by decreasing the dose. They're currently at a quarter of a pill. The daughter does not remember if weaning off of hydrocodone happen at the same time that these hallucinations started happening. Patient has had no recent illnesses, no fever, no dysuria, no nausea, no vomiting, no URI symptoms. Daughter also reports more confusion recently during the day.  Past Medical History:  Diagnosis Date  . Allergy   . Anemia   . Arthritis    osteo  . CAD (coronary artery disease)    a. 2 DES, [facility], 2004; b. nuc 2013 septal dyssenergy 2/2 LBBB, nl LV fxn;  c. 06/2015 MV: EF 41%, septal HK (LBBB), no  ischemia->Low risk.  . Chronic atrial fibrillation (HCC)    a. on Coumadin (CHA2DS2VASc = 6); b. Holter 06/2010, no brady, mild tachy.  . Chronic systolic CHF (congestive heart failure) ([place])    a. 03/2015 Echo: EF 30-35%;  b. 01/2016 Echo: EF 30-35%.  . Compression fracture of lumbar vertebra ([place]) 05-2008  . COPD (chronic obstructive pulmonary disease) ([place])   . Diverticulosis of colon   . Dyslipidemia   . Essential hypertension   . Family history of adverse reaction to anesthesia    daughter PONV  . GERD (gastroesophageal reflux disease)   . Hard of hearing   . Hematemesis    a. 03/2015.  [name] Hx of colonoscopy   . Hypothyroid   . Ischemic cardiomyopathy    a. 03/2015 Echo: EF 30-35%, ant, antsept HK, mod MR, mildly dil LA/RV, mod dil RA, mod-sev TR, PASP 85mmHg;  b. 01/2016 Echo: EF 30-35%, ant/antsept HK, mildly dil Ao root (3.4cm) and Asc Ao (3.5cm), mild [email or web address] bi-atrial enlargement, mild to mod TR. PASP 45mmHg.  [name] LBBB (left bundle branch block)   . Lung abnormality    Dr [name] 122/2011 no further w/u needed  . Mitral regurgitation   . Myocardial infarction [facility]) 2016   "mild heart attack"  . Nausea    Some nausea with medications, May, 2013,  . Nocturia   . OA (osteoarthritis)   . Orthostatic hypotension    June, 2014  . Osteoporosis   . Pleural effusion associated with pulmonary infection 03-2007  . Prolonged QT interval    when on  sotolol in past  . Pulmonary hypertension ([place])    a. 03/2015 PASP 60mmHg by echo.  . Shingles 2014  . Thrombocytopenia ([place])   . TR (tricuspid regurgitation)    moderate, echo 2008  . Urinary frequency   . Vitamin B deficiency   . Warfarin anticoagulation   . Wears dentures   . Wears glasses   . Weight loss    August, 2012    Patient Active Problem List   Diagnosis Date Noted  . Sundowning 10/20/2017  . COPD (chronic obstructive pulmonary disease) ([place]) 05/03/2017  . Palliative care encounter   . Acute respiratory distress  04/11/2017  . AKI (acute kidney injury) ([place]) 04/11/2017  . Hypomagnesemia 04/11/2017  . Cough 04/07/2017  . H/O: pneumonia 04/07/2017  . Muscle weakness (generalized)   . Unsteadiness on feet   . Arterial hypotension   . Adjustment disorder with mixed anxiety and depressed mood 06/09/2015  . Generalized weakness 05/12/2015  . Chronic systolic CHF (congestive heart failure) ([place]) 04/29/2015  . Cardiomyopathy, ischemic   . Pulmonary hypertension ([place])   . Adult failure to thrive   . Unstable angina ([place]) 04/07/2015  . Venous stasis dermatitis 06/19/2014  . Orthostatic hypotension   . Acute on chronic combined systolic (congestive) and diastolic (congestive) heart failure ([place]) 03/02/2013  . History of shingles 02/28/2013  . Nail fungus 01/10/2013  . Hyperlipidemia 07/09/2011  . Hearing loss 07/09/2011  . Weight loss   . A-fib ([place])   . CAD in native artery   . Mitral regurgitation   . LBBB (left bundle branch block)   . Warfarin anticoagulation   . TR (tricuspid regurgitation)   . Dyslipidemia   . Prolonged QT interval   . Coccydynia 12/03/2010  . Nonspecific (abnormal) findings on radiological and other examination of body structure 07/13/2010  . ABNORMAL CHEST XRAY 07/13/2010  . Shortness of breath 06/18/2010  . Essential hypertension 06/24/2009  . GOUT, UNSPECIFIED 03/20/2009  . GERD 03/18/2009  . Thrombocytopenia ([place]) 01/03/2009  . ALLERGIC RHINITIS 12/05/2008  . HOARSENESS 06/07/2008  . MEDIASTINAL LYMPHADENOPATHY 05/13/2008  . Other diseases of lung, not elsewhere classified 04/18/2008  . Venous (peripheral) insufficiency 09/13/2007  . FREQUENCY, URINARY 07/19/2007  . TINNITUS NOS 05/11/2007  . Osteoporosis 05/11/2007  . Hypothyroidism 04/11/2007  . ANEMIA, B12 DEFICIENCY 04/11/2007    Past Surgical History:  Procedure Laterality Date  . COLONOSCOPY    . CORONARY ANGIOPLASTY  2004  . LUMBAR LAMINECTOMY/DECOMPRESSION MICRODISCECTOMY N/A 03/23/2016    Procedure: Lumbar three-four Laminectomy/Foraminotomy, posterior laterial arthrodesis;  Surgeon: Leeroy Cha, MD;  Location: [facility] NEURO ORS;  Service: Neurosurgery;  Laterality: N/A;  L3-4 Laminectomy/Foraminotomy  . THYROIDECTOMY  1962  . TONSILLECTOMY      Prior to Admission medications   Medication Sig Start Date End Date Taking? Authorizing Provider  Chlorpheniramine-Phenylephrine 4-10 MG tablet Take 1 tablet by mouth once. Take 1 pill every am for sinus/allergies.   Yes [provider]  docusate sodium (COLACE) 100 MG capsule Take 100 mg by mouth at bedtime.    Yes [provider]  Glucosamine HCl 500 MG TABS Take 500 mg by mouth daily.    Yes [provider]  HYDROcodone-acetaminophen (NORCO/VICODIN) 5-325 MG tablet Take 1-2 tablets by mouth every 6 (six) hours as needed for moderate pain. HOSPICE PATIENT 09/07/17  Yes Tower, Wynelle Fanny, MD  levothyroxine (SYNTHROID, LEVOTHROID) 88 MCG tablet TAKE ONE TABLET BY MOUTH EVERY DAY 11/29/16  Yes Tower, Wynelle Fanny, MD  metoprolol tartrate (  LOPRESSOR) 25 MG tablet Take 0.5 tablets (12.5 mg total) by mouth 2 (two) times daily. Patient taking differently: Take 12.5 mg by mouth 2 (two) times daily. HOLD evening dose if BP is under 110 05/11/16  Yes Gollan, Kathlene November, MD  midodrine (PROAMATINE) 5 MG tablet TAKE ONE TABLET BY MOUTH TWICE DAILY WITH MEALS 08/10/17  Yes Gollan, Kathlene November, MD  mirtazapine (REMERON) 15 MG tablet TAKE ONE TABLET AT BEDTIME 05/05/17  Yes Tower, Wynelle Fanny, MD  multivitamin-lutein (OCUVITE-LUTEIN) CAPS capsule Take 1 capsule by mouth daily.   Yes [provider]  nitroGLYCERIN (NITROSTAT) 0.4 MG SL tablet Place 0.4 mg under the tongue every 5 (five) minutes as needed for chest pain (up to a max of 3).   Yes [provider]  pantoprazole (PROTONIX) 40 MG tablet TAKE ONE TABLET BY MOUTH EVERY DAY 11/29/16  Yes Tower, Wynelle Fanny, MD  Polyethyl Glycol-Propyl Glycol (SYSTANE OP) Apply 1 drop to eye  daily.   Yes [provider]  polyethylene glycol (MIRALAX / GLYCOLAX) packet Take 17 g by mouth daily. 05/10/17  Yes Fritzi Mandes, MD  senna (SENOKOT) 8.6 MG tablet Take 1 tablet by mouth daily.   Yes [provider]  simvastatin (ZOCOR) 10 MG tablet Take 10 mg by mouth daily.   Yes [provider]  torsemide (DEMADEX) 20 MG tablet Take 20 mg by mouth daily as needed. Takes 1 pill if gains 5lbs or more in week or 1 pill if 2lbs or greater overnight.   Yes [provider]  cephALEXin (KEFLEX) 500 MG capsule Take 1 capsule (500 mg total) by mouth 3 (three) times daily for 7 days. 11/15/17 11/22/17  Rudene Re, MD    Allergies Sulfonamide derivatives; Alendronate sodium; and Prednisone  Family History  Problem Relation Age of Onset  . Heart disease Mother 12  . Stroke Father 8  . Heart disease Sister   . Cancer Brother        colon  . Diabetes Brother   . Leukemia Sister     Social History Social History   Tobacco Use  . Smoking status: Former Smoker    Types: Cigarettes    Last attempt to quit: 09/14/1947    Years since quitting: 70.2  . Smokeless tobacco: Never Used  Substance Use Topics  . Alcohol use: No    Alcohol/week: 0.0 oz  . Drug use: No    Review of Systems  Constitutional: Negative for fever. Eyes: Negative for visual changes. ENT: Negative for sore throat. Neck: No neck pain  Cardiovascular: Negative for chest pain. Respiratory: Negative for shortness of breath. Gastrointestinal: Negative for abdominal pain, vomiting or diarrhea. Genitourinary: Negative for dysuria. Musculoskeletal: Negative for back pain. Skin: Negative for rash. Neurological: Negative for headaches, weakness or numbness. Psych: No SI or HI. + hallucinations  ____________________________________________   PHYSICAL EXAM:  VITAL SIGNS: ED Triage Vitals [11/15/17 1736]  Enc Vitals Group     BP 140/86     Pulse Rate 84     Resp 18     Temp  97.7 F (36.5 C)     Temp Source Oral     SpO2 98 %     Weight 111 lb (50.3 kg)     Height 5\' 9"  (1.753 m)     Head Circumference      Peak Flow      Pain Score      Pain Loc      Pain Edu?  Excl. in [place]?     Constitutional: Alert and oriented x2. Well appearing and in no apparent distress. HEENT:      Head: Normocephalic and atraumatic.         Eyes: Conjunctivae are normal. Sclera is non-icteric.       Mouth/Throat: Mucous membranes are moist.       Neck: Supple with no signs of meningismus. Cardiovascular: Regular rate and rhythm. No murmurs, gallops, or rubs. 2+ symmetrical distal pulses are present in all extremities. No JVD. Respiratory: Normal respiratory effort. Lungs are clear to auscultation bilaterally. No wheezes, crackles, or rhonchi.  Gastrointestinal: Soft, non tender, and non distended with positive bowel sounds. No rebound or guarding. Musculoskeletal: Nontender with normal range of motion in all extremities. No edema, cyanosis, or erythema of extremities. Neurologic: Normal speech and language. Face is symmetric. Moving all extremities. No gross focal neurologic deficits are appreciated. Skin: Skin is warm, dry and intact. No rash noted. Psychiatric: Mood and affect are normal. Speech and behavior are normal.  ____________________________________________   LABS (all labs ordered are listed, but only abnormal results are displayed)  Labs Reviewed  COMPREHENSIVE METABOLIC PANEL - Abnormal; Notable for the following components:      Result Value   Creatinine, Ser 1.01 (*)    ALT 8 (*)    GFR calc non Af Amer 46 (*)    GFR calc Af Amer 53 (*)    All other components within normal limits  CBC - Abnormal; Notable for the following components:   RDW 15.2 (*)    Platelets 120 (*)    All other components within normal limits  URINALYSIS, COMPLETE (UACMP) WITH MICROSCOPIC - Abnormal; Notable for the following components:   Color, Urine YELLOW (*)     APPearance CLEAR (*)    Leukocytes, UA MODERATE (*)    Squamous Epithelial / LPF 0-5 (*)    All other components within normal limits  URINE CULTURE  TSH  T4, FREE  CBG MONITORING, ED   ____________________________________________  EKG  ED ECG REPORT I, Rudene Re, the attending physician, personally viewed and interpreted this ECG.  Atrial fibrillation, rate of 76, left bundle branch block, normal QTC, right axis deviation, no ST elevations or depressions. Unchanged from prior   19:40 - atrial fibrillation, rate of 79, left bundle branch block, no ST elevations or depressions. Unchanged from initial. ____________________________________________  RADIOLOGY  I have personally reviewed the images performed during this visit and I agree with the Radiologist's read.   Interpretation by Radiologist:  Ct Head Wo Contrast  Result Date: 11/15/2017 CLINICAL DATA:  Mental status changes.  Hallucinations. EXAM: CT HEAD WITHOUT CONTRAST TECHNIQUE: Contiguous axial images were obtained from the base of the skull through the vertex without intravenous contrast. COMPARISON:  05/07/2017 FINDINGS: Brain: Mild for age low density in the periventricular white matter likely related to small vessel disease. Expected cerebral and cerebellar volume loss for age. No mass lesion, hemorrhage, hydrocephalus, acute infarct, intra-axial, or extra-axial fluid collection. Vascular: Intracranial atherosclerosis. Skull: Normal Sinuses/Orbits: Surgical changes about the globes. Clear paranasal sinuses and mastoid air cells. Other: None. IMPRESSION: 1. Normal head CT for age. 2.  Cerebral atrophy and small vessel ischemic change. Electronically Signed   By: Abigail Miyamoto M.D.   On: 11/15/2017 19:15     ____________________________________________   PROCEDURES  Procedure(s) performed: None Procedures Critical Care performed:  None ____________________________________________   INITIAL IMPRESSION /  ASSESSMENT AND PLAN / ED COURSE  84  y.o. female with a history of CAD, anemia, atrial fibrillation not on anticoagulation, CHF, COPD, hypertension, CHF, thrombocytopenia who presents for evaluation of several weeks of visual and auditory hallucinations maybe coinciding with weaning off of hydrocodone which patient has been on it since 04/2017. Patient is well-appearing, in no distress, alert and oriented 2, normal vital signs, completely neurologically intact. We'll do a head CT, check labs to rule out electrolyte abnormalities, dehydration, thyroid dysfunction, UTI. If workup is negative in the emergency room anticipate discharge home with close follow-up with primary care doctor.    _________________________ 8:56 PM on 11/15/2017 -----------------------------------------  Patient remains extremely well-appearing with normal vitals, neurologically intact. Workup essentially showing a UTI with no other acute findings. No evidence of sepsis. Normal CMP, normal CBC, normal thyroid studies. EKG with no changes from prior. Troponin is negative. CT head with no acute changes. Patient was started on Keflex and is gone ago home on 3 times a day for 7 days. Recommended continuing to wean her off of hydrocodone and treat the UTI and re-convene with primary care doctor next week for reevaluation. Discussed return precautions for any signs of stroke, or worsening infection.   As part of my medical decision making, I reviewed the following data within the [place] History obtained from family, Nursing notes reviewed and incorporated, Labs reviewed , EKG interpreted , Old EKG reviewed, Old chart reviewed, Radiograph reviewed , Notes from prior ED visits and [place] Controlled Substance Database    Pertinent labs & imaging results that were available during my care of the patient were reviewed by me and considered in my medical decision making (see chart for  details).    ____________________________________________   FINAL CLINICAL IMPRESSION(S) / ED DIAGNOSES  Final diagnoses:  Acute cystitis with hematuria  Hallucinations      NEW MEDICATIONS STARTED DURING THIS VISIT:  ED Discharge Orders        Ordered    cephALEXin (KEFLEX) 500 MG capsule  3 times daily     11/15/17 2054       Note:  This document was prepared using Dragon voice recognition software and may include unintentional dictation errors.    Rudene Re, MD 11/15/17 [id number]

## 2017-11-15 NOTE — Telephone Encounter (Signed)
Copied from [place]. Topic: Quick Communication - See Telephone Encounter >> Nov 15, 2017 10:46 AM Oliver Pila B wrote: CRM for notification. See Telephone encounter for: Today (3.5.19 Nurse Triage), pt's daughter called and wanted to have a phone call to f/u on what needs to be done about her mother dreaming and stating she is hearing people singing (namely angels) but she wants to talk about mothers condition, pt's daughter she has everything resolved w/ [facility], contact pt's daughter  11/15/17.

## 2017-11-15 NOTE — Telephone Encounter (Signed)
I copied the recent one to send - if they need a new one done please let me know

## 2017-11-15 NOTE — ED Triage Notes (Signed)
To ER via POV with family member stating that she "just doesn't feel right". Pt has been experiencing hallucinations over past few weeks in the evenings. States that today got worse, as she has been hallucinating during the day. Instructed to come to ER by PCP. Pt alert and oriented X 4 at this time.

## 2017-11-15 NOTE — ED Notes (Signed)
Patient transported to CT 

## 2017-11-15 NOTE — Telephone Encounter (Signed)
Copied from [place] [id number]. Topic: Quick Communication - See Telephone Encounter >> Nov 15, 2017  2:03 PM [name], NT wrote: CRM for notification. See Telephone encounter for: 11/15/17. Pt daughter called and said pt needed a new FL2 filled out and sent to [phone]. Attention leslie. Also daughter said pt will be winged off of HYDROcodone-acetaminophen (NORCO/VICODIN) 5-325 MG.

## 2017-11-15 NOTE — Discharge Instructions (Signed)

## 2017-11-15 NOTE — Telephone Encounter (Signed)
Patient is waiting for placement at [name]. Patient's daughter called to report they received the Medicaid ID number yesterday and [facility] is holding a bed for patient. Daughter is asking for guidance as to how to proceed.  Please advise;  [phone] Reason for Disposition . [1] Caller requesting NON-URGENT health information AND [2] PCP's office is the best resource  Answer Assessment - Initial Assessment Questions 1. REASON FOR CALL or QUESTION: "What is your reason for calling today?" or "How can I best help you?" or "What question do you have that I can help answer?"     Daughter called re: patient placement at [facility].  Received Medicaid ID number yesterday.  Protocols used: INFORMATION ONLY CALL-A-AH

## 2017-11-15 NOTE — Telephone Encounter (Signed)
Per new CRM pt has everything resolved with [facility]

## 2017-11-15 NOTE — Telephone Encounter (Signed)
She will have to find out from [employer] - does she need an updated FL2?    I'm not sure but happy to do anything they need  That is good news

## 2017-11-16 ENCOUNTER — Ambulatory Visit: Payer: Self-pay | Admitting: *Deleted

## 2017-11-16 NOTE — Telephone Encounter (Signed)
Copy of FL2 form faxed to [facility] and copy also mailed to pt's daughter [name].  Daughter wanted you to know pt had a rough night she went to be around 10pm and work up at 2am and has been up ever since with delirium and hallucinations. Daughter said that pt will finish weaning off of the hydrocodone tomorrow so the doc's at the ER told them that they are not sure if pt is having problems due to hydrocodone or the UTI but she will take her last dose of the 1/4 tab tomorrow she just wanted you to know pt is still not doing good. I did advise pt's daughter that it does take a few days for the abx to start helping and since pt is finish with hydrocodone tomorrow that pt should start feeling better soon but if not let us know. FYI to Dr. Glori Bickers per daughter request

## 2017-11-16 NOTE — Telephone Encounter (Signed)
Thanks for letting me know I bet both will help  CT of head was reassuring

## 2017-11-17 LAB — URINE CULTURE

## 2017-11-18 LAB — GLUCOSE, CAPILLARY: Glucose-Capillary: 82 mg/dL (ref 65–99)

## 2017-11-21 ENCOUNTER — Telehealth: Payer: Self-pay | Admitting: Family Medicine

## 2017-11-21 NOTE — Telephone Encounter (Signed)
Daughter notified of Dr. Marliss Coots comments. Daughter said so far pt is back at baseline so no f/u needed but if pt's sxs come back she will f/u with our office

## 2017-11-21 NOTE — Telephone Encounter (Signed)
Copied from [place]. Topic: Quick Communication - See Telephone Encounter >> Nov 21, 2017 10:28 AM Ether Griffins B wrote: CRM for notification. See Telephone encounter for:  Pt was in the ER on 11/15/17 and was diagnosed with UTI and was told to follow up with PCP. [name] is wondering if the follow up is necessary.  11/21/17.

## 2017-11-21 NOTE — Telephone Encounter (Signed)
I spoke with [name] and ED had recommended f/u in 2 days after being seen on 11/15/17; pt has 2 days of abx left and [name] wants to know if should wait until finishes abx to be rechecked. [name] request cb.

## 2017-11-21 NOTE — Telephone Encounter (Signed)
No need for follow up if she is completely better after finishing antibiotics  If not improved however-please f/u after finishing   Hope she is feeling bettter!

## 2017-11-23 ENCOUNTER — Ambulatory Visit: Payer: Self-pay

## 2017-11-23 NOTE — Telephone Encounter (Signed)
Pt.'s daughter reports last night she had some confusion and vivid dreams - "seeing two little girls." Pt. Is alert and oriented this morning.Daughter reports pt.is moving to [facility] next week and wants Dr. Glori Bickers to see her before she goes.Appointment made for next week as requested.  Reason for Disposition . [1] Longstanding confusion (e.g., dementia, stroke) AND [2] NO worsening or change  Answer Assessment - Initial Assessment Questions 1. LEVEL OF CONSCIOUSNESS: "How is he (she, the patient) acting right now?" (e.g., alert-oriented, confused, lethargic, stuporous, comatose)     Alert and oriented this morning 2. ONSET: "When did the confusion start?"  (minutes, hours, days)     Started last night 3. PATTERN "Does this come and go, or has it been constant since it started?"  "Is it present now?"     Just last night 4. ALCOHOL or DRUGS: "Has he been drinking alcohol or taking any drugs?"      No 5. NARCOTIC MEDICATIONS: "Has he been receiving any narcotic medications?" (e.g., morphine, Vicodin)     No 6. CAUSE: "What do you think is causing the confusion?"      Unsure 7. OTHER SYMPTOMS: "Are there any other symptoms?" (e.g., difficulty breathing, headache, fever, weakness)     No  Protocols used: [place]

## 2017-11-24 ENCOUNTER — Telehealth: Payer: Self-pay | Admitting: Family Medicine

## 2017-11-24 NOTE — Telephone Encounter (Signed)
Received a call from [place] at [facility]. Patient was discharged from [facility] due to stability. Family is requesting Palliative Care and they need the form filled out by Dr Glori Bickers along with most recent office note, and faxed back to them. Fax# is [phone]. Form in Dr towers in-box.

## 2017-11-25 NOTE — Telephone Encounter (Signed)
Please call daughter to answer the questions re: reason for ref and change in status  I assume high priority I think she is in SNF now  Thanks  Please print last office note also  In IN box

## 2017-11-25 NOTE — Telephone Encounter (Signed)
Per Rosaria Ferries it can wait until her appt on Tuesday because they want the most recent OV note

## 2017-11-28 DIAGNOSIS — H353130 Nonexudative age-related macular degeneration, bilateral, stage unspecified: Secondary | ICD-10-CM | POA: Diagnosis not present

## 2017-11-29 ENCOUNTER — Encounter: Payer: Self-pay | Admitting: Family Medicine

## 2017-11-29 ENCOUNTER — Ambulatory Visit (INDEPENDENT_AMBULATORY_CARE_PROVIDER_SITE_OTHER): Payer: PPO | Admitting: Family Medicine

## 2017-11-29 VITALS — BP 104/66 | HR 78 | Temp 97.3°F | Ht 67.0 in

## 2017-11-29 DIAGNOSIS — F05 Delirium due to known physiological condition: Secondary | ICD-10-CM

## 2017-11-29 DIAGNOSIS — E039 Hypothyroidism, unspecified: Secondary | ICD-10-CM

## 2017-11-29 DIAGNOSIS — N390 Urinary tract infection, site not specified: Secondary | ICD-10-CM

## 2017-11-29 DIAGNOSIS — D518 Other vitamin B12 deficiency anemias: Secondary | ICD-10-CM | POA: Diagnosis not present

## 2017-11-29 DIAGNOSIS — R627 Adult failure to thrive: Secondary | ICD-10-CM | POA: Diagnosis not present

## 2017-11-29 NOTE — Progress Notes (Signed)
Subjective:    Patient ID: [name], female    DOB: Aug 25, 1922, 84 y.o.   MRN: [id number]  HPI Here for f/u re: confusion at night   Last episode was middle of last week No issues this week at all- doing well   Finished abx for her uti   occ c/o of urinary retention at night  Takes chlorpheniramine-phenylephrine for chronic sinus problems    Suspect sundowning  Hallucinations and vivid dreams reported in the past  Done with hydrocodone now for 2 weeks (no more pain either)    Moving in to SNF tomorrow  Plans to get more active and inv in activity  Recently ref for palliative care  No longer in hospice  Making the effort to drink more water    Had uti 3/5 -seen in ED Had CT at that time  Interpretation by Radiologist:  Ct Head Wo Contrast  Result Date: 11/15/2017 CLINICAL DATA:  Mental status changes.  Hallucinations. EXAM: CT HEAD WITHOUT CONTRAST TECHNIQUE: Contiguous axial images were obtained from the base of the skull through the vertex without intravenous contrast. COMPARISON:  05/07/2017 FINDINGS: Brain: Mild for age low density in the periventricular white matter likely related to small vessel disease. Expected cerebral and cerebellar volume loss for age. No mass lesion, hemorrhage, hydrocephalus, acute infarct, intra-axial, or extra-axial fluid collection. Vascular: Intracranial atherosclerosis. Skull: Normal Sinuses/Orbits: Surgical changes about the globes. Clear paranasal sinuses and mastoid air cells. Other: None. IMPRESSION: 1. Normal head CT for age. 2.  Cerebral atrophy and small vessel ischemic change. Electronically Signed   By: Abigail Miyamoto M.D.   On: 11/15/2017 19:15  Review of Systems  Constitutional: Positive for appetite change and fatigue. Negative for activity change, fever and unexpected weight change.  HENT: Negative for congestion, ear pain, rhinorrhea, sinus pressure and sore throat.   Eyes: Negative for pain, redness and visual  disturbance.  Respiratory: Negative for cough, shortness of breath and wheezing.   Cardiovascular: Negative for chest pain and palpitations.  Gastrointestinal: Negative for abdominal pain, blood in stool, constipation and diarrhea.  Endocrine: Negative for polydipsia and polyuria.  Genitourinary: Positive for difficulty urinating and vaginal discharge. Negative for decreased urine volume, dysuria, frequency, hematuria, pelvic pain, urgency and vaginal bleeding.  Musculoskeletal: Positive for arthralgias and back pain. Negative for myalgias.       Pain is improved   Skin: Negative for pallor and rash.  Allergic/Immunologic: Negative for environmental allergies.  Neurological: Negative for dizziness, syncope and headaches.  Hematological: Negative for adenopathy. Does not bruise/bleed easily.  Psychiatric/Behavioral: Positive for hallucinations. Negative for agitation, behavioral problems, confusion, decreased concentration, dysphoric mood and sleep disturbance. The patient is not nervous/anxious.        Confusion and hallucinations at night have improved/resolved      Pos uti tx with keflex at the time  ua was pos  ucx was contaminated   Lab Results  Component Value Date   CREATININE 1.01 (H) 11/15/2017   BUN 18 11/15/2017   NA 139 11/15/2017   K 4.4 11/15/2017   CL 104 11/15/2017   CO2 27 11/15/2017   Lab Results  Component Value Date   ALT 8 (L) 11/15/2017   AST 20 11/15/2017   ALKPHOS 53 11/15/2017   BILITOT 0.7 11/15/2017   Lab Results  Component Value Date   WBC 5.0 11/15/2017   HGB 12.2 11/15/2017   HCT 37.8 11/15/2017   MCV 94.9 11/15/2017   PLT 120 (L) 11/15/2017  Lab Results  Component Value Date   TSH 3.000 11/15/2017   Free T4 in nl range   Lab Results  Component Value Date   VITAMINB12 222 10/19/2017   Getting B12 shots   Mag level nl 8/18 at 1.9   Takes remeron for sleep/appetite and mood   Wt Readings from Last 3 Encounters:  11/15/17 111  lb (50.3 kg)  05/09/17 118 lb 1.6 oz (53.6 kg)  05/05/17 114 lb 2 oz (51.8 kg)  wt is down  Daughter fixes her meals- eats what she likes Beans and pb for protein  Likes cabbage and corn bread   Still working on approval for medicaid     BP Readings from Last 3 Encounters:  11/29/17 104/66  11/15/17 (!) 162/93  10/19/17 130/68    Dr Ouida Sills will be at [facility] - for her care        Objective:   Physical Exam  Constitutional: She appears well-developed and well-nourished. No distress.  Underweight frail appearing elderly female in no distress   HENT:  Head: Normocephalic and atraumatic.  Mouth/Throat: Oropharynx is clear and moist.  Eyes: Conjunctivae and EOM are normal. Pupils are equal, round, and reactive to light.  Neck: Normal range of motion. Neck supple. No JVD present. Carotid bruit is not present. No thyromegaly present.  Cardiovascular: Normal rate, regular rhythm, normal heart sounds and intact distal pulses. Exam reveals no gallop.  Pulmonary/Chest: Effort normal and breath sounds normal. No respiratory distress. She has no wheezes. She has no rales.  No crackles  Abdominal: Soft. Bowel sounds are normal. She exhibits no distension, no abdominal bruit and no mass. There is no tenderness.  Musculoskeletal: She exhibits no edema or tenderness.  Lymphadenopathy:    She has no cervical adenopathy.  Neurological: She is alert. She has normal reflexes. No cranial nerve deficit. She exhibits normal muscle tone. Coordination normal.  In wheelchair   Skin: Skin is warm and dry. No rash noted. No pallor.  Baseline hyperpigmentation of lower legs   Psychiatric: She has a normal mood and affect.  Mood is good today  Cognition is improved  Supportive daughter present           Assessment & Plan:   Problem List Items Addressed This Visit      Endocrine   Hypothyroidism    Hypothyroidism  Pt has no clinical changes No change in energy level/ hair or skin/  edema and no tremor MS changes have resolved Lab this mo  Lab Results  Component Value Date   TSH 3.000 11/15/2017            Genitourinary   Frequent UTI    Symptoms are improved after tx of keflex Multiple bact on cx  No longer sundowning  Disc urinary retention at night- will stop her antihistamine/decong med at bedtime to see if this helps          Other   Adult failure to thrive    Again disc imp of regular meals with protein  She is a picky eater and her daughter will bring extra food to her nsg home as well       ANEMIA, B12 DEFICIENCY    Continue monthly B12 shots       Sundowning - Primary    With visual hallucinations as well as lucid dreams This has improved with cessation of hydrocodone and also tx of uti  Continue to watch Also d/c her antihist/decongestant med today- ?  Anticholinergic side eff Will be moving to SNF tomorrow-focus on palliative care

## 2017-11-29 NOTE — Assessment & Plan Note (Signed)
With visual hallucinations as well as lucid dreams This has improved with cessation of hydrocodone and also tx of uti  Continue to watch Also d/c her antihist/decongestant med today- ? Anticholinergic side eff Will be moving to SNF tomorrow-focus on palliative care

## 2017-11-29 NOTE — Assessment & Plan Note (Signed)
Continue monthly B12 shots.  

## 2017-11-29 NOTE — Assessment & Plan Note (Signed)
Hypothyroidism  Pt has no clinical changes No change in energy level/ hair or skin/ edema and no tremor MS changes have resolved Lab this mo  Lab Results  Component Value Date   TSH 3.000 11/15/2017

## 2017-11-29 NOTE — Assessment & Plan Note (Signed)
Symptoms are improved after tx of keflex Multiple bact on cx  No longer sundowning  Disc urinary retention at night- will stop her antihistamine/decong med at bedtime to see if this helps

## 2017-11-29 NOTE — Assessment & Plan Note (Signed)
Again disc imp of regular meals with protein  She is a picky eater and her daughter will bring extra food to her nsg home as well

## 2017-11-29 NOTE — Patient Instructions (Addendum)
Stop the antihistamine-decongestant (chlorpheniramine- phenylephrine) because it may cause urinary retention at night  If nasal symptoms are much worse - alert [place]   Continue regular meals and fluids   Keep me posted re: urinary issues and mental status changes  Encourage as much socialization and activity as possible   I will send this note to Dr Ouida Sills when it is done

## 2017-12-01 DIAGNOSIS — I951 Orthostatic hypotension: Secondary | ICD-10-CM | POA: Diagnosis not present

## 2017-12-01 DIAGNOSIS — E440 Moderate protein-calorie malnutrition: Secondary | ICD-10-CM | POA: Diagnosis not present

## 2017-12-01 DIAGNOSIS — I5022 Chronic systolic (congestive) heart failure: Secondary | ICD-10-CM | POA: Diagnosis not present

## 2017-12-01 DIAGNOSIS — I25119 Atherosclerotic heart disease of native coronary artery with unspecified angina pectoris: Secondary | ICD-10-CM | POA: Diagnosis not present

## 2017-12-01 DIAGNOSIS — I4891 Unspecified atrial fibrillation: Secondary | ICD-10-CM | POA: Diagnosis not present

## 2017-12-02 DIAGNOSIS — R262 Difficulty in walking, not elsewhere classified: Secondary | ICD-10-CM | POA: Diagnosis not present

## 2017-12-02 DIAGNOSIS — M6281 Muscle weakness (generalized): Secondary | ICD-10-CM | POA: Diagnosis not present

## 2017-12-12 DIAGNOSIS — R262 Difficulty in walking, not elsewhere classified: Secondary | ICD-10-CM | POA: Diagnosis not present

## 2017-12-12 DIAGNOSIS — M6281 Muscle weakness (generalized): Secondary | ICD-10-CM | POA: Diagnosis not present

## 2018-02-23 DIAGNOSIS — I4891 Unspecified atrial fibrillation: Secondary | ICD-10-CM | POA: Diagnosis not present

## 2018-02-23 DIAGNOSIS — I951 Orthostatic hypotension: Secondary | ICD-10-CM | POA: Diagnosis not present

## 2018-02-23 DIAGNOSIS — E440 Moderate protein-calorie malnutrition: Secondary | ICD-10-CM | POA: Diagnosis not present

## 2018-02-23 DIAGNOSIS — I25119 Atherosclerotic heart disease of native coronary artery with unspecified angina pectoris: Secondary | ICD-10-CM | POA: Diagnosis not present

## 2018-02-23 DIAGNOSIS — I5022 Chronic systolic (congestive) heart failure: Secondary | ICD-10-CM | POA: Diagnosis not present

## 2018-02-23 DIAGNOSIS — Z0000 Encounter for general adult medical examination without abnormal findings: Secondary | ICD-10-CM | POA: Diagnosis not present

## 2018-04-30 NOTE — Progress Notes (Signed)
Cardiology Office Note  Date:  05/01/2018   ID:  [name] BAKULA, DOB February 28, 1922, MRN [id number]  PCP:  Kirk Ruths, MD   Chief Complaint  Patient presents with  . other    OD 6 month f/u c/o sob and weakness. Meds reviewed verbally with pt.    HPI:  84 y.o. female with h/o  coronary artery disease,  atrial fibrillation on Coumadin,  chronic combined systolic and diastolic CHF, mitral regurgitation, tricuspid regurgitation,  prolonged QT, LBBB,  carotid disease,  HTN,  thrombocytopenia, Moderate pulmonary hypertension, ejection fraction 40-45% Prior stress test with no ischemia Previous history of orthostatic hypotension, takes midodrine twice a day with improved symptoms She presents today for follow-up of her acute on chronic systolic and diastolic CHF and pulmonary hypertension  Weight up to 124 pound Previously 114 pounds Living at [facility] more, daughter has been measuring her weights over the past several months Daughter also notes that she is more short of breath Patient reports her abdomen feels firm and she is short of breath Very limited in her ability to ambulate with walker secondary to shortness of breath "Gives out"  Sinus drainage  EKG personally reviewed by myself on todays visit Shows normal sinus rhythmwith rate 76 bpm left bundle branch block left anterior fascicular block  Of past medical history reviewed  hospital admission August 2018 for anemia, pneumonia  shortness of breath and coughing Treated with antibiotics and Lasix Placed back on torsemide at the time of discharge  Lab work reviewed showing creatinine 1.65, BUN 48 04/15/2017 Creatinine was 1.19 in February 2018  Lab work reviewed Hematocrit 37.8 TSH 3.0 Creatinine 1.0 BUN 18 potassium 4.4  Last echocardiogram July 2018 Ejection fraction 40-45% Anterior wall hypokinesis Moderate to severe MR Moderately dilated left atrium Severely elevated right heart  pressures  hospital admission January 2018 for shortness of breath,Flu positive, CHF exacerbation Weight at that time was 135 pounds on admission Discharged 10/14/2016 after diuresis and treatment of flu she was treated with IV Lasix while inpatient Weight now 122 up to 123 pounds She is taking Lasix every other day with potassium every other day Weight has been relatively stable, now watching her salt intake  Other past medical history reviewed Underwent lumbar back surgery over the summer 2017, reports that she did well Pain has improved Limited mobility   presented to the ED on 04/07/15 with complaints of nausea, bloody emesis, burning chest pain that extended into the neck, shortness of breath, and worsening weakness and productive cough.  Echocardiogram at the time showed moderate pulmonary hypertension, significant TR, ejection fraction was reduced 30-35% Outpatient stress test showed no ischemia, anteroseptal perfusion defect consistent with bundle branch block She is currently living at [facility] She does take midodrine in the morning and evening for orthostatic hypotension   Echo 04/07/15 showed normal LV size, EF 30-35% (down from Echo on 02/28/13: EF 50-55%), hypokenesis of the anteroseptal myocardium, MV moderate regurg, mildly dilated left atrium and right ventricle, moderate to severe TR, and PA peak pressure 57 mmHg.   PMH:   has a past medical history of Allergy, Anemia, Arthritis, CAD (coronary artery disease), Chronic atrial fibrillation (HCC), Chronic systolic CHF (congestive heart failure) ([place]), Compression fracture of lumbar vertebra ([place]) (05-2008), COPD (chronic obstructive pulmonary disease) ([place]), Diverticulosis of colon, Dyslipidemia, Essential hypertension, Family history of adverse reaction to anesthesia, GERD (gastroesophageal reflux disease), Hard of hearing, Hematemesis, colonoscopy, Hypothyroid, Ischemic cardiomyopathy, LBBB (left bundle branch block),  Lung abnormality, Mitral regurgitation, Myocardial infarction ([place]) (2016), Nausea, Nocturia, OA (osteoarthritis), Orthostatic hypotension, Osteoporosis, Pleural effusion associated with pulmonary infection (03-2007), Prolonged QT interval, Pulmonary hypertension ([place]), Shingles (2014), Thrombocytopenia ([place]), TR (tricuspid regurgitation), Urinary frequency, Vitamin B deficiency, Warfarin anticoagulation, Wears dentures, Wears glasses, and Weight loss.  PSH:    Past Surgical History:  Procedure Laterality Date  . COLONOSCOPY    . CORONARY ANGIOPLASTY  2004  . LUMBAR LAMINECTOMY/DECOMPRESSION MICRODISCECTOMY N/A 03/23/2016   Procedure: Lumbar three-four Laminectomy/Foraminotomy, posterior laterial arthrodesis;  Surgeon: Leeroy Cha, MD;  [facility] NEURO ORS;  Service: Neurosurgery;  Laterality: N/A;  L3-4 Laminectomy/Foraminotomy  . THYROIDECTOMY  1962  . TONSILLECTOMY      Current Outpatient Medications  Medication Sig Dispense Refill  . docusate sodium (COLACE) 100 MG capsule Take 100 mg by mouth at bedtime.     [name] levothyroxine (SYNTHROID, LEVOTHROID) 88 MCG tablet TAKE ONE TABLET BY MOUTH EVERY DAY 90 tablet 1  . metoprolol tartrate (LOPRESSOR) 25 MG tablet Take 0.5 tablets (12.5 mg total) by mouth 2 (two) times daily. (Patient taking differently: Take 12.5 mg by mouth 2 (two) times daily. HOLD evening dose if BP is under 110) 180 tablet 3  . midodrine (PROAMATINE) 5 MG tablet TAKE ONE TABLET BY MOUTH TWICE DAILY WITH MEALS 180 tablet 3  . mirtazapine (REMERON) 15 MG tablet TAKE ONE TABLET AT BEDTIME 90 tablet 3  . multivitamin-lutein (OCUVITE-LUTEIN) CAPS capsule Take 1 capsule by mouth daily.    . nitroGLYCERIN (NITROSTAT) 0.4 MG SL tablet Place 0.4 mg under the tongue every 5 (five) minutes as needed for chest pain (up to a max of 3).    . pantoprazole (PROTONIX) 40 MG tablet TAKE ONE TABLET BY MOUTH EVERY DAY 90 tablet 1  . Polyethyl Glycol-Propyl Glycol (SYSTANE OP) Apply 1 drop to  eye daily.    . polyethylene glycol (MIRALAX / GLYCOLAX) packet Take 17 g by mouth daily. 14 each 0  . senna (SENOKOT) 8.6 MG tablet Take 1 tablet by mouth daily.    . simvastatin (ZOCOR) 10 MG tablet Take 10 mg by mouth daily.    [name] torsemide (DEMADEX) 20 MG tablet Take 20 mg by mouth daily as needed. Takes 1 pill if gains 5lbs or more in week or 1 pill if 2lbs or greater overnight.    . torsemide (DEMADEX) 20 MG tablet Take 1 tablet (20 mg total) by mouth as needed (Twice a week (Monday/Thursday) HOLD for weight less than 120 pounds). BMP & BNP in 3 to 4 weeks. Please fax to [phone] 30 tablet 3   Current Facility-Administered Medications  Medication Dose Route Frequency Provider Last Rate Last Dose  . cyanocobalamin ((VITAMIN B-12)) injection 1,000 mcg  1,000 mcg Intramuscular Q30 days Tower, Wynelle Fanny, MD   1,000 mcg at 04/05/17 1100     Allergies:   Sulfonamide derivatives; Alendronate sodium; and Prednisone   Social History:  The patient  reports that she quit smoking about 70 years ago. Her smoking use included cigarettes. She has never used smokeless tobacco. She reports that she does not drink alcohol or use drugs.   Family History:   family history includes Cancer in her brother; Diabetes in her brother; Heart disease in her sister; Heart disease (age of onset: 32) in her mother; Leukemia in her sister; Stroke (age of onset: 50) in her father.    Review of Systems: Review of Systems  Respiratory: Negative.   Cardiovascular: Negative.   Gastrointestinal: Negative.  Musculoskeletal: Negative.        Leg weakness  Neurological: Negative.   Psychiatric/Behavioral: Negative.   All other systems reviewed and are negative.    PHYSICAL EXAM: VS:  BP 120/60 (BP Location: Left Arm, Patient Position: Sitting, Cuff Size: Normal)   Pulse 76   Ht 5\' 7"  (1.702 m)   Wt 124 lb 8 oz (56.5 kg)   BMI 19.50 kg/m  , BMI Body mass index is 19.5 kg/m. GEN: Thin woman in no acute distress  , presenting in wheelchair Constitutional:  oriented to person, place, and time. No distress.  HENT:  Head: Normocephalic and atraumatic.  Eyes:  no discharge. No scleral icterus.  Neck: Normal range of motion. Neck supple. No JVD present.  Cardiovascular: Normal rate, regular rhythm, normal heart sounds and intact distal pulses. Exam reveals no gallop and no friction rub. No edema No murmur heard. Pulmonary/Chest: Effort normal and breath sounds normal. No stridor. No respiratory distress.  no wheezes.  no rales.  no tenderness.  Abdominal: Soft.  no distension.  no tenderness.  Musculoskeletal: Normal range of motion.  no  tenderness or deformity.  Neurological:  normal muscle tone. Coordination normal. No atrophy Skin: Skin is warm and dry. No rash noted. not diaphoretic.  Psychiatric:  normal mood and affect. behavior is normal. Thought content normal.    Recent Labs: 11/15/2017: ALT 8; BUN 18; Creatinine, Ser 1.01; Hemoglobin 12.2; Platelets 120; Potassium 4.4; Sodium 139; TSH 3.000    Lipid Panel Lab Results  Component Value Date   CHOL 133 10/19/2017   HDL 49.40 10/19/2017   LDLCALC 72 10/19/2017   TRIG 55.0 10/19/2017      Wt Readings from Last 3 Encounters:  05/01/18 124 lb 8 oz (56.5 kg)  11/15/17 111 lb (50.3 kg)  05/09/17 118 lb 1.6 oz (53.6 kg)       ASSESSMENT AND PLAN:  Hypotension Blood pressure stable We'll continue current medications  Persistent atrial fibrillation ([place]) - Plan: EKG 12-Lead Was previously on warfarin Anticoagulation not on her medication list on today's visit This was held after hospitalization August 2018 in the setting of pneumonia, was placed on hospice. She was not discharged on her warfarin In office follow-up was changed to eliquis 2.5 twice a day Had a fall 2 days later, the back of her head Anticoagulation held on discharged from that hospital admission 05/10/2017 Continued on hospice  Acute on chronic combined systolic  and diastolic heart failure ([place]) - Plan: EKG 12-Lead 5-10 pound weight gain over the past several months She was previously taking torsemide 20 daily Dry weight 114 pounds Current weight 124 pounds Recommend torsemide 20 mg twice a week , hold the torsemide for weight less than 120 pounds This will need adjusting over the next month or 2 BMP and BNP in 3-4 weeks time  SOB (shortness of breath) - Plan: EKG 12-Lead Torsemide 20 mg twice a week Currently only taking this once every week or so  Total encounter time more than 45 minutes  Greater than 50% was spent in counseling and coordination of care with the patient  Daughter will call with weight numbers Disposition:   F/U  6 months   Orders Placed This Encounter  Procedures  . EKG 12-Lead     Signed, Esmond Plants, M.D., Ph.D. 05/01/2018  [place], [place]

## 2018-05-01 ENCOUNTER — Encounter: Payer: Self-pay | Admitting: Cardiovascular Disease

## 2018-05-01 ENCOUNTER — Ambulatory Visit (INDEPENDENT_AMBULATORY_CARE_PROVIDER_SITE_OTHER): Payer: PPO | Admitting: Cardiovascular Disease

## 2018-05-01 VITALS — BP 120/60 | HR 76 | Ht 67.0 in | Wt 124.5 lb

## 2018-05-01 DIAGNOSIS — R0602 Shortness of breath: Secondary | ICD-10-CM

## 2018-05-01 DIAGNOSIS — I959 Hypotension, unspecified: Secondary | ICD-10-CM | POA: Diagnosis not present

## 2018-05-01 DIAGNOSIS — J449 Chronic obstructive pulmonary disease, unspecified: Secondary | ICD-10-CM

## 2018-05-01 DIAGNOSIS — I5022 Chronic systolic (congestive) heart failure: Secondary | ICD-10-CM

## 2018-05-01 DIAGNOSIS — I272 Pulmonary hypertension, unspecified: Secondary | ICD-10-CM

## 2018-05-01 DIAGNOSIS — I509 Heart failure, unspecified: Secondary | ICD-10-CM | POA: Diagnosis not present

## 2018-05-01 DIAGNOSIS — I481 Persistent atrial fibrillation: Secondary | ICD-10-CM

## 2018-05-01 DIAGNOSIS — I255 Ischemic cardiomyopathy: Secondary | ICD-10-CM | POA: Diagnosis not present

## 2018-05-01 DIAGNOSIS — F0150 Vascular dementia without behavioral disturbance: Secondary | ICD-10-CM | POA: Diagnosis not present

## 2018-05-01 DIAGNOSIS — I10 Essential (primary) hypertension: Secondary | ICD-10-CM | POA: Diagnosis not present

## 2018-05-01 DIAGNOSIS — I951 Orthostatic hypotension: Secondary | ICD-10-CM

## 2018-05-01 DIAGNOSIS — F329 Major depressive disorder, single episode, unspecified: Secondary | ICD-10-CM | POA: Diagnosis not present

## 2018-05-01 DIAGNOSIS — I4819 Other persistent atrial fibrillation: Secondary | ICD-10-CM

## 2018-05-01 MED ORDER — TORSEMIDE 20 MG PO TABS: 20.0000 mg | ORAL_TABLET | ORAL | 3 refills | Status: DC | PRN

## 2018-05-01 NOTE — Patient Instructions (Addendum)
Medication Instructions:   Please take torsemide 20 mg twice a week (Monday /Thursday) Hold for weight less than 120 pounds  Labwork:  BMP and BNP in 3 to 4 weeks Please fax to our office for review  Testing/Procedures:  No further testing at this time   Follow-Up: It was a pleasure seeing you in the office today. Please call us if you have new issues that need to be addressed before your next appt.  [phone]  Your physician wants you to follow-up in: 6 months.  You will receive a reminder letter in the mail two months in advance. If you don't receive a letter, please call our office to schedule the follow-up appointment.  If you need a refill on your cardiac medications before your next appointment, please call your pharmacy.  For educational health videos Log in to : [email or web address] Or : [email or web address], password : [email or web address]

## 2018-05-05 DIAGNOSIS — E440 Moderate protein-calorie malnutrition: Secondary | ICD-10-CM | POA: Diagnosis not present

## 2018-05-05 DIAGNOSIS — I25119 Atherosclerotic heart disease of native coronary artery with unspecified angina pectoris: Secondary | ICD-10-CM | POA: Diagnosis not present

## 2018-05-05 DIAGNOSIS — N183 Chronic kidney disease, stage 3 (moderate): Secondary | ICD-10-CM | POA: Diagnosis not present

## 2018-05-05 DIAGNOSIS — Z789 Other specified health status: Secondary | ICD-10-CM | POA: Diagnosis not present

## 2018-05-05 DIAGNOSIS — I5022 Chronic systolic (congestive) heart failure: Secondary | ICD-10-CM | POA: Diagnosis not present

## 2018-05-05 DIAGNOSIS — I4891 Unspecified atrial fibrillation: Secondary | ICD-10-CM | POA: Diagnosis not present

## 2018-05-09 ENCOUNTER — Telehealth: Payer: Self-pay | Admitting: Cardiovascular Disease

## 2018-05-09 NOTE — Telephone Encounter (Signed)
Pt daughter asks if pt can take a Potassium tablet, vs. Eating a banana. States she is not sure her home care provider can provide a banana daily. Please call to discuss.

## 2018-05-09 NOTE — Telephone Encounter (Signed)
S/w patient's daughter, ok per DPR. Patient lives at [facility]. Dr Rockey Situ started patient on torsemide two times a week at last OV on 05/01/18. Daughter is concerned that patient may need potassium supplement. She remembers last time the patient ate bananas when she took it. Advised her that ok to eat a banana a couple times a week. Dr Rockey Situ ordered for patient to have lab work in 3-4 weeks which will check her potassium. Advised that at that time if potassium is needed then Dr Rockey Situ will make decision according to lab work. She verbalized understanding.

## 2018-05-22 DIAGNOSIS — Z79899 Other long term (current) drug therapy: Secondary | ICD-10-CM | POA: Diagnosis not present

## 2018-05-26 DIAGNOSIS — L603 Nail dystrophy: Secondary | ICD-10-CM | POA: Diagnosis not present

## 2018-05-26 DIAGNOSIS — B351 Tinea unguium: Secondary | ICD-10-CM | POA: Diagnosis not present

## 2018-05-26 DIAGNOSIS — I739 Peripheral vascular disease, unspecified: Secondary | ICD-10-CM | POA: Diagnosis not present

## 2018-05-26 NOTE — Telephone Encounter (Signed)
Spoke with patients daughter per release form and reviewed recent labs done at the facility. Potassium was normal at 4.5 but she had some concerns about the abnormal BUN at 34.6 with BUN/Creat ratio at 28.8. Advised that I would place on providers desk for his review and that we would be in touch with any further recommendations such as repeat labs and/or other changes. She verbalized understanding with no further questions at this time.

## 2018-05-26 NOTE — Telephone Encounter (Addendum)
Patient calling to discuss recent lab testing results faxed over from [facility]  Please call

## 2018-05-30 ENCOUNTER — Telehealth: Payer: Self-pay | Admitting: *Deleted

## 2018-05-30 NOTE — Telephone Encounter (Signed)
-----   Message from Minna Merritts, MD sent at 05/28/2018  1:26 PM EDT ----- Labs look ok Can we ask facility about leg swelling (amount? Any better on torsemide 20 mg 2x per week)  and weight

## 2018-05-30 NOTE — Telephone Encounter (Signed)
Spoke with patients daughter per release form and she reviewed patients weights listed below. She reports patient is getting her torsemide on Tuesday and Friday's and that she has no notable swelling. Advised that I would route this to Dr. Rockey Situ for his review and would be in touch if any further recommendations were made. She was appreciative for the call with no further questions at this time.  05/09/18 121.8 Torsemide 05/10/18 120.2 05/11/18 120.0 05/12/18 120.4 Torsemide 05/13/18 119.2 05/14/18 119.4  05/23/18 122.4 Torsemide 05/24/18 120.6 05/25/18 120.0 05/26/18 121.8 Torsemide 05/27/18 119.4

## 2018-06-07 DIAGNOSIS — N390 Urinary tract infection, site not specified: Secondary | ICD-10-CM | POA: Diagnosis not present

## 2018-06-08 DIAGNOSIS — I4891 Unspecified atrial fibrillation: Secondary | ICD-10-CM | POA: Diagnosis not present

## 2018-06-08 DIAGNOSIS — I25119 Atherosclerotic heart disease of native coronary artery with unspecified angina pectoris: Secondary | ICD-10-CM | POA: Diagnosis not present

## 2018-06-08 DIAGNOSIS — I5022 Chronic systolic (congestive) heart failure: Secondary | ICD-10-CM | POA: Diagnosis not present

## 2018-06-08 DIAGNOSIS — Z789 Other specified health status: Secondary | ICD-10-CM | POA: Diagnosis not present

## 2018-06-08 DIAGNOSIS — K219 Gastro-esophageal reflux disease without esophagitis: Secondary | ICD-10-CM | POA: Diagnosis not present

## 2018-06-08 DIAGNOSIS — I951 Orthostatic hypotension: Secondary | ICD-10-CM | POA: Diagnosis not present

## 2018-06-08 DIAGNOSIS — E440 Moderate protein-calorie malnutrition: Secondary | ICD-10-CM | POA: Diagnosis not present

## 2018-06-08 DIAGNOSIS — N183 Chronic kidney disease, stage 3 (moderate): Secondary | ICD-10-CM | POA: Diagnosis not present

## 2018-06-23 DIAGNOSIS — M6281 Muscle weakness (generalized): Secondary | ICD-10-CM | POA: Diagnosis not present

## 2018-06-23 DIAGNOSIS — Z23 Encounter for immunization: Secondary | ICD-10-CM | POA: Diagnosis not present

## 2018-06-23 DIAGNOSIS — R262 Difficulty in walking, not elsewhere classified: Secondary | ICD-10-CM | POA: Diagnosis not present

## 2018-07-14 IMAGING — CT CT HEAD W/O CM
3 of 6 series · 15 of 47 positions shown, 18 images · non-contrast
Comparison: 05/07/2017 at 8822 hours

CLINICAL DATA: Fall, confusion

EXAM:
CT HEAD WITHOUT CONTRAST
TECHNIQUE: Contiguous axial images were obtained from the base of the skull
through the vertex without intravenous contrast.

[Series 2: head wo · axial · 0.43mm/px · z∈[+504,+624]mm · 9 of 30 slices shown, 12 images]
[im 3/30  brain]
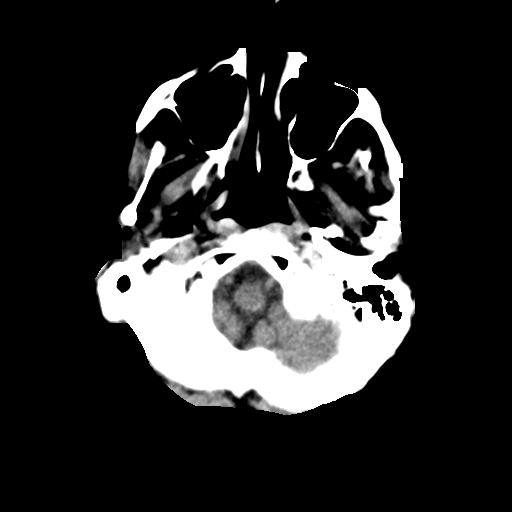
[im 3/30  bone]
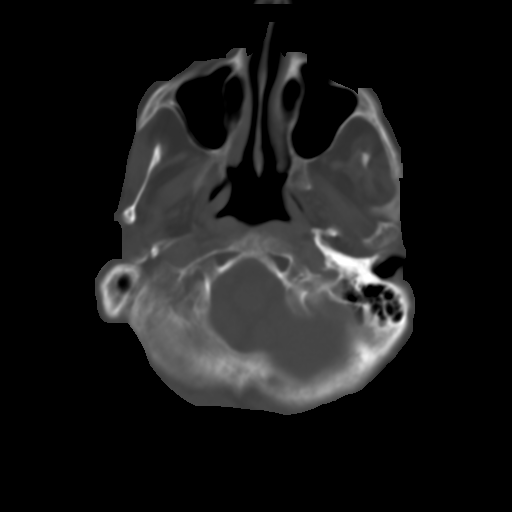
[im 6/30  brain]
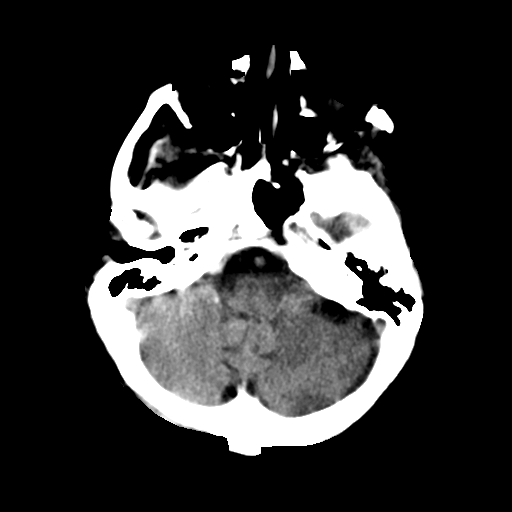
[im 9/30  brain]
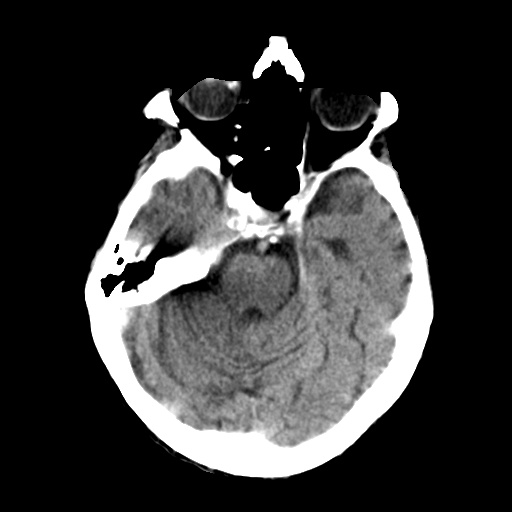
[im 12/30  brain]
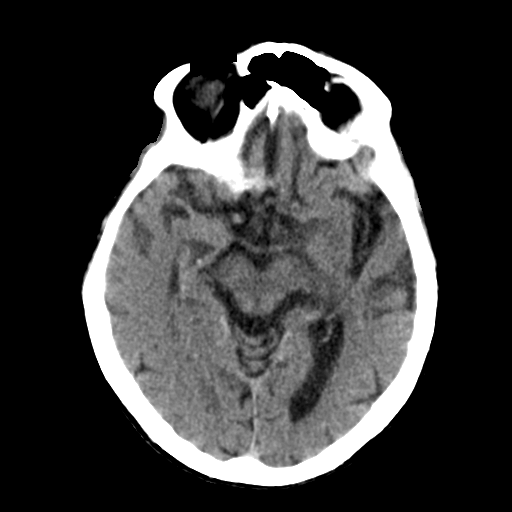
[im 16/30  brain]
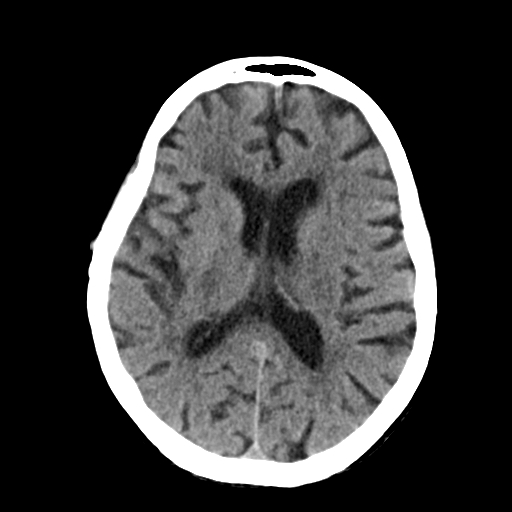
[im 16/30  bone]
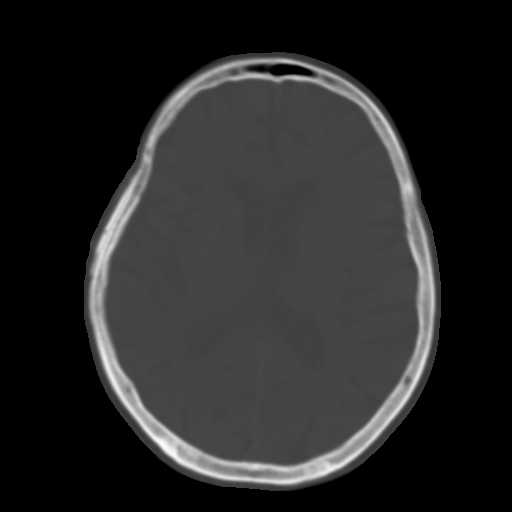
[im 18/30  brain]
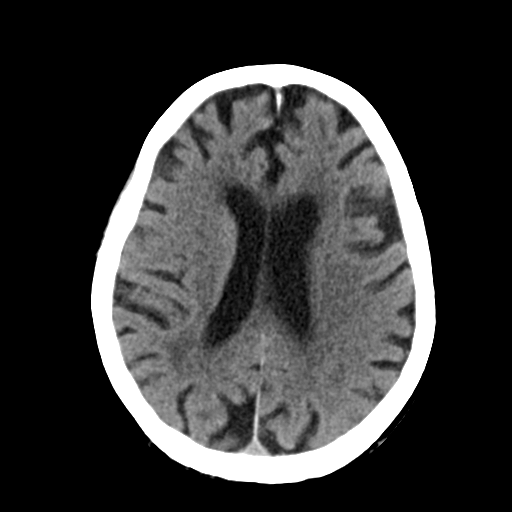
[im 21/30  brain]
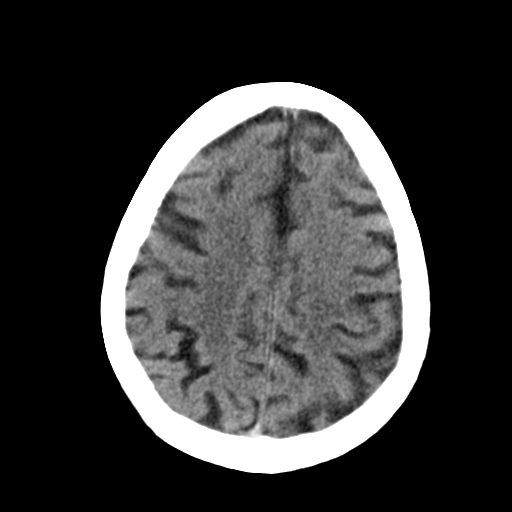
[im 24/30  brain]
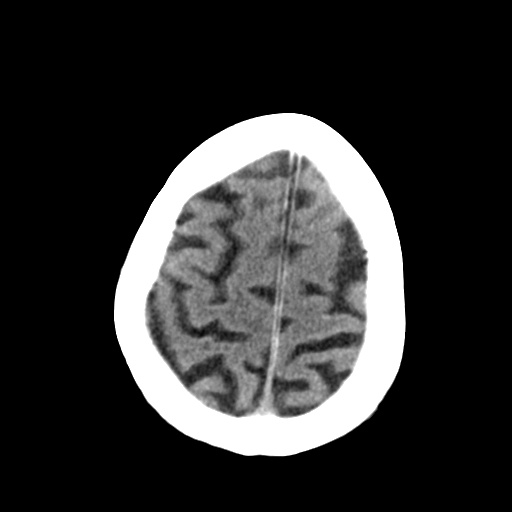
[im 27/30  brain]
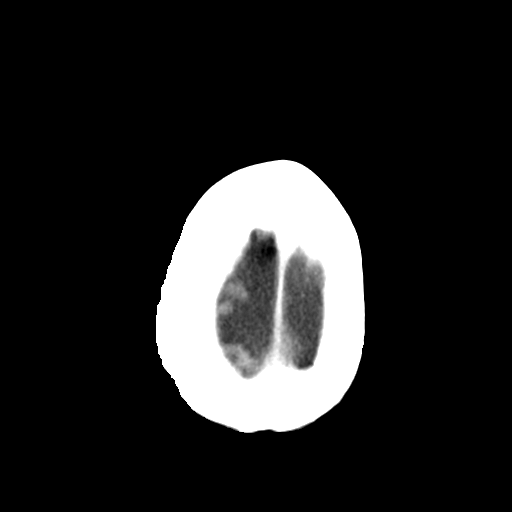
[im 27/30  bone]
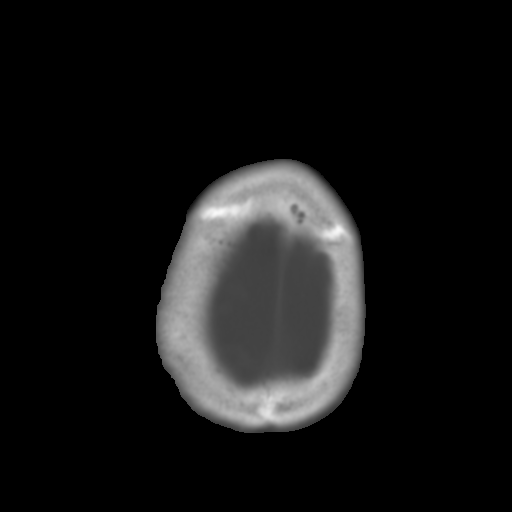

[Series 6: coronal soft tissue · coronal · 0.30mm/px · 3 of 70 slices shown]
[im 18/70  brain]
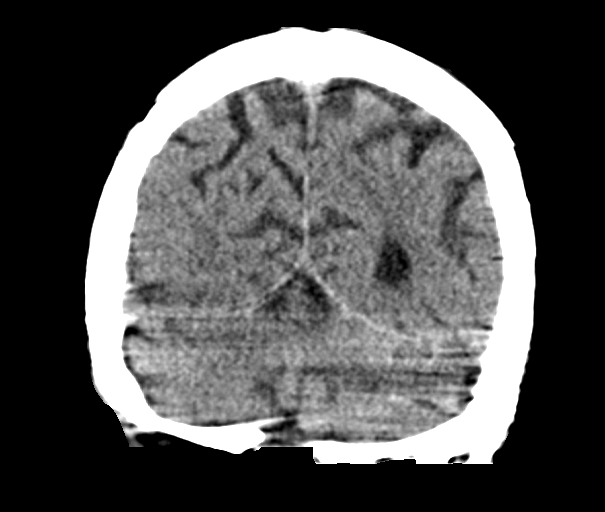
[im 35/70  brain]
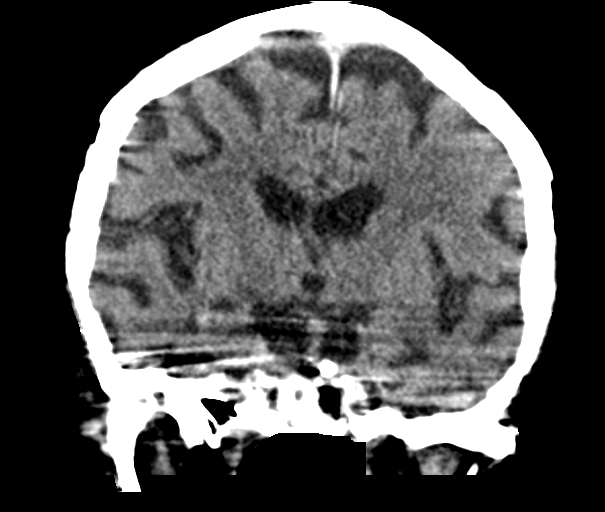
[im 52/70  brain]
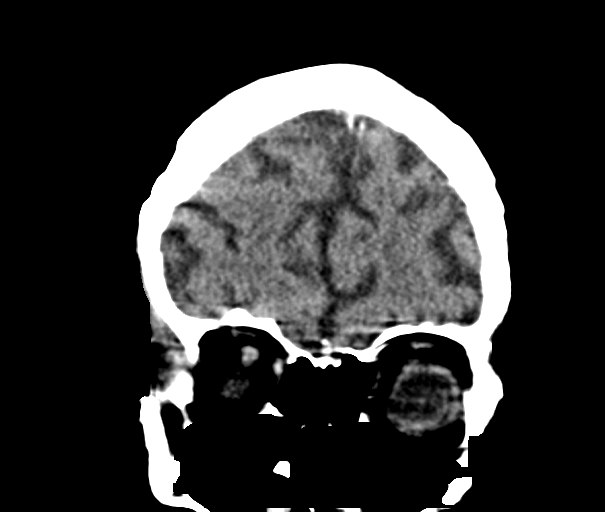

[Series 9: sagittal soft tissue · sagittal · 0.33mm/px · 3 of 46 slices shown]
[im 12/46  brain]
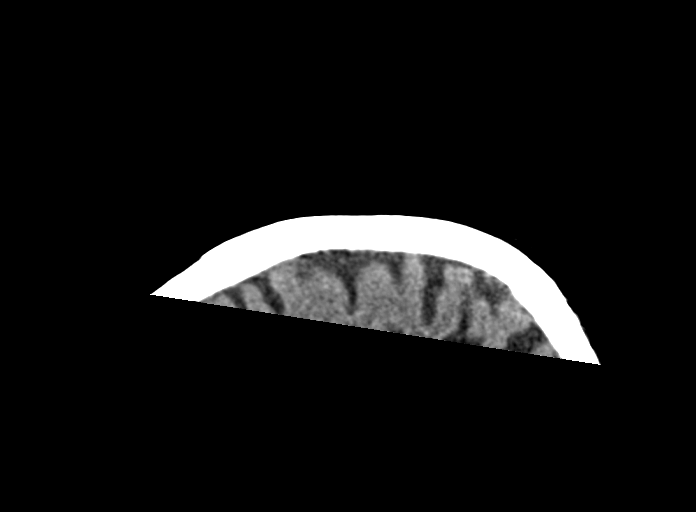
[im 23/46  brain]
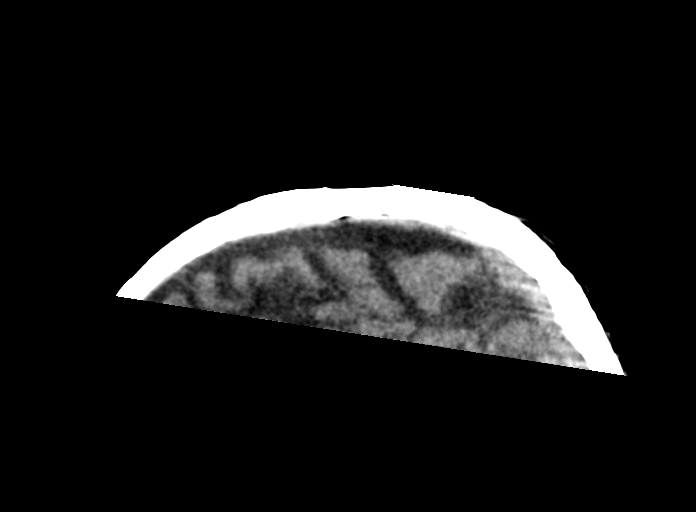
[im 34/46  brain]
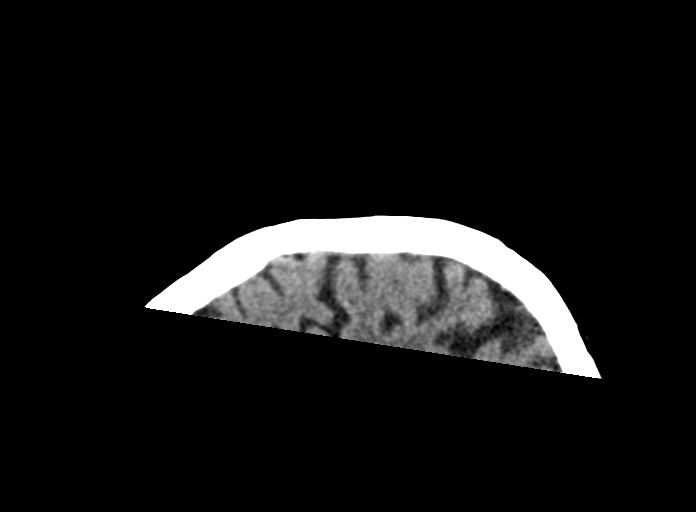

[15 of 47 positions shown; findings below may reference images not displayed]

FINDINGS: Motion degraded images.

Brain: No evidence of acute infarction, hemorrhage, hydrocephalus,
extra-axial collection or mass lesion/mass effect.

Subcortical white matter and periventricular small vessel ischemic
changes.

Vascular: Intracranial atherosclerosis.

Skull: Normal. Negative for fracture or focal lesion.

Sinuses/Orbits: The visualized paranasal sinuses are essentially
clear. The mastoid air cells are unopacified.

Other: None.
IMPRESSION: Motion degraded images.

No evidence of acute intracranial abnormality. Small vessel ischemic
changes.

No interval change.

## 2018-07-17 DIAGNOSIS — S0512XA Contusion of eyeball and orbital tissues, left eye, initial encounter: Secondary | ICD-10-CM | POA: Diagnosis not present

## 2018-07-17 DIAGNOSIS — D696 Thrombocytopenia, unspecified: Secondary | ICD-10-CM | POA: Diagnosis not present

## 2018-07-18 DIAGNOSIS — Z79899 Other long term (current) drug therapy: Secondary | ICD-10-CM | POA: Diagnosis not present

## 2018-07-20 DIAGNOSIS — E039 Hypothyroidism, unspecified: Secondary | ICD-10-CM | POA: Diagnosis not present

## 2018-08-09 DIAGNOSIS — N183 Chronic kidney disease, stage 3 (moderate): Secondary | ICD-10-CM | POA: Diagnosis not present

## 2018-08-09 DIAGNOSIS — I25119 Atherosclerotic heart disease of native coronary artery with unspecified angina pectoris: Secondary | ICD-10-CM | POA: Diagnosis not present

## 2018-08-09 DIAGNOSIS — E440 Moderate protein-calorie malnutrition: Secondary | ICD-10-CM | POA: Diagnosis not present

## 2018-08-09 DIAGNOSIS — I4891 Unspecified atrial fibrillation: Secondary | ICD-10-CM | POA: Diagnosis not present

## 2018-08-09 DIAGNOSIS — Z789 Other specified health status: Secondary | ICD-10-CM | POA: Diagnosis not present

## 2018-08-09 DIAGNOSIS — I5022 Chronic systolic (congestive) heart failure: Secondary | ICD-10-CM | POA: Diagnosis not present

## 2018-08-16 DIAGNOSIS — H04123 Dry eye syndrome of bilateral lacrimal glands: Secondary | ICD-10-CM | POA: Diagnosis not present

## 2018-08-16 DIAGNOSIS — Z961 Presence of intraocular lens: Secondary | ICD-10-CM | POA: Diagnosis not present

## 2018-08-16 DIAGNOSIS — H31091 Other chorioretinal scars, right eye: Secondary | ICD-10-CM | POA: Diagnosis not present

## 2018-08-16 DIAGNOSIS — Z79899 Other long term (current) drug therapy: Secondary | ICD-10-CM | POA: Diagnosis not present

## 2018-08-16 DIAGNOSIS — H02052 Trichiasis without entropian right lower eyelid: Secondary | ICD-10-CM | POA: Diagnosis not present

## 2018-08-16 DIAGNOSIS — D649 Anemia, unspecified: Secondary | ICD-10-CM | POA: Diagnosis not present

## 2018-08-23 DIAGNOSIS — I5022 Chronic systolic (congestive) heart failure: Secondary | ICD-10-CM | POA: Diagnosis not present

## 2018-08-23 DIAGNOSIS — E440 Moderate protein-calorie malnutrition: Secondary | ICD-10-CM | POA: Diagnosis not present

## 2018-08-23 DIAGNOSIS — N183 Chronic kidney disease, stage 3 (moderate): Secondary | ICD-10-CM | POA: Diagnosis not present

## 2018-08-23 DIAGNOSIS — Z789 Other specified health status: Secondary | ICD-10-CM | POA: Diagnosis not present

## 2018-08-23 DIAGNOSIS — I25119 Atherosclerotic heart disease of native coronary artery with unspecified angina pectoris: Secondary | ICD-10-CM | POA: Diagnosis not present

## 2018-08-23 DIAGNOSIS — I4891 Unspecified atrial fibrillation: Secondary | ICD-10-CM | POA: Diagnosis not present

## 2018-08-28 DIAGNOSIS — I10 Essential (primary) hypertension: Secondary | ICD-10-CM | POA: Diagnosis not present

## 2018-08-29 DIAGNOSIS — L570 Actinic keratosis: Secondary | ICD-10-CM | POA: Diagnosis not present

## 2018-09-12 DIAGNOSIS — Z79899 Other long term (current) drug therapy: Secondary | ICD-10-CM | POA: Diagnosis not present

## 2018-12-28 ENCOUNTER — Telehealth: Payer: Self-pay | Admitting: Cardiovascular Disease

## 2018-12-28 NOTE — Telephone Encounter (Signed)
Consent pending via email  To facility   [email or web address]

## 2018-12-29 ENCOUNTER — Telehealth: Payer: Medicare (Managed Care) | Admitting: Cardiovascular Disease

## 2018-12-29 NOTE — Telephone Encounter (Signed)
Unable to send via staff email.  Fwd to patient mychart .  Pending response

## 2018-12-30 NOTE — Progress Notes (Unsigned)
{Choose 1 Note Type (Telehealth Visit or Telephone Visit):[phone]}   I connected with  [name] on 12/30/18 by a video enabled telemedicine application and verified that I am speaking with the correct person using two identifiers. I discussed the limitations of evaluation and management by telemedicine. The patient expressed understanding and agreed to proceed.   Evaluation Performed:  Follow-up visit  Date:  12/30/2018   ID:  [name], DOB [birth year], MRN [id number]  Patient Location:  [place] [id number]   Provider location:   [facility], [place] office  PCP:  [name] Ruths, MD  Cardiologist:  [name], Branch   Chief Complaint:      History of Present Illness:    [name] is a 85 y.o. female who presents via audio/video conferencing for a telehealth visit today.   The patient does not symptoms concerning for COVID-19 infection (fever, chills, cough, or new SHORTNESS OF BREATH).   Patient has a past medical history of coronary artery disease,  atrial fibrillation on Coumadin,  chronic combined systolic and diastolic CHF, mitral regurgitation, tricuspid regurgitation,  prolonged QT, LBBB,  carotid disease,  HTN,  thrombocytopenia, Moderate pulmonary hypertension, ejection fraction 40-45% Prior stress test with no ischemia Previous history of orthostatic hypotension, takes midodrine twice a day with improved symptoms She presents today for follow-up of her acute on chronic systolic and diastolic CHF and pulmonary hypertension  Weight up to 124 pound Previously 114 pounds Living at [facility] more, daughter has been measuring her weights over the past several months Daughter also notes that she is more short of breath Patient reports her abdomen feels firm and she is short of breath Very limited in her ability to ambulate with walker secondary to shortness of breath "Gives  out"  Sinus drainage    Of past medical history reviewed  hospital admission August 2018 for anemia, pneumonia  shortness of breath and coughing Treated with antibiotics and Lasix Placed back on torsemide at the time of discharge  Lab work reviewed showing creatinine 1.65, BUN 48 04/15/2017 Creatinine was 1.19 in February 2018  Lab work reviewed Hematocrit 37.8 TSH 3.0 Creatinine 1.0 BUN 18 potassium 4.4  Last echocardiogram July 2018 Ejection fraction 40-45% Anterior wall hypokinesis Moderate to severe MR Moderately dilated left atrium Severely elevated right heart pressures  hospital admission January 2018 for shortness of breath,Flu positive, CHF exacerbation Weight at that time was 135 pounds on admission Discharged 10/14/2016 after diuresis and treatment of flu she was treated with IV Lasix while inpatient Weight now 122 up to 123 pounds She is taking Lasix every other day with potassium every other day Weight has been relatively stable, now watching her salt intake  Other past medical history reviewed Underwent lumbar back surgery over the summer 2017, reports that she did well Pain has improved Limited mobility  presented to the ED on 04/07/15 with complaints of nausea, bloody emesis, burning chest pain that extended into the neck, shortness of breath, and worsening weakness and productive cough.  Echocardiogram at the time showed moderate pulmonary hypertension, significant TR, ejection fraction was reduced 30-35% Outpatient stress test showed no ischemia, anteroseptal perfusion defect consistent with bundle branch block She is currently living at [facility] [place] She does take midodrine in the morning and evening for orthostatic hypotension   Echo 04/07/15 showed normal LV size, EF 30-35% (down from Echo on 02/28/13: EF 50-55%), hypokenesis of the anteroseptal myocardium, MV moderate  regurg, mildly dilated left atrium and right ventricle, moderate to severe  TR, and PA peak pressure 57 mmHg.    Prior CV studies:   The following studies were reviewed today:    Past Medical History:  Diagnosis Date  . Allergy   . Anemia   . Arthritis    osteo  . CAD (coronary artery disease)    a. 2 DES, [facility], 2004; b. nuc 2013 septal dyssenergy 2/2 LBBB, nl LV fxn;  c. 06/2015 MV: EF 41%, septal HK (LBBB), no ischemia->Low risk.  . Chronic atrial fibrillation (HCC)    a. on Coumadin (CHA2DS2VASc = 6); b. Holter 06/2010, no brady, mild tachy.  . Chronic systolic CHF (congestive heart failure) ([place])    a. 03/2015 Echo: EF 30-35%;  b. 01/2016 Echo: EF 30-35%.  . Compression fracture of lumbar vertebra ([place]) 05-2008  . COPD (chronic obstructive pulmonary disease) ([place])   . Diverticulosis of colon   . Dyslipidemia   . Essential hypertension   . Family history of adverse reaction to anesthesia    daughter PONV  . GERD (gastroesophageal reflux disease)   . Hard of hearing   . Hematemesis    a. 03/2015.  [name] Hx of colonoscopy   . Hypothyroid   . Ischemic cardiomyopathy    a. 03/2015 Echo: EF 30-35%, ant, antsept HK, mod MR, mildly dil LA/RV, mod dil RA, mod-sev TR, PASP 57mmHg;  b. 01/2016 Echo: EF 30-35%, ant/antsept HK, mildly dil Ao root (3.4cm) and Asc Ao (3.5cm), mild [email or web address] bi-atrial enlargement, mild to mod TR. PASP 47mmHg.  [name] LBBB (left bundle branch block)   . Lung abnormality    Dr [name] 122/2011 no further w/u needed  . Mitral regurgitation   . Myocardial infarction [facility]) 2016   "mild heart attack"  . Nausea    Some nausea with medications, May, 2013,  . Nocturia   . OA (osteoarthritis)   . Orthostatic hypotension    June, 2014  . Osteoporosis   . Pleural effusion associated with pulmonary infection 03-2007  . Prolonged QT interval    when on sotolol in past  . Pulmonary hypertension ([place])    a. 03/2015 PASP 64mmHg by echo.  . Shingles 2014  . Thrombocytopenia ([place])   . TR (tricuspid regurgitation)    moderate, echo 2008  . Urinary  frequency   . Vitamin B deficiency   . Warfarin anticoagulation   . Wears dentures   . Wears glasses   . Weight loss    August, 2012   Past Surgical History:  Procedure Laterality Date  . COLONOSCOPY    . CORONARY ANGIOPLASTY  2004  . LUMBAR LAMINECTOMY/DECOMPRESSION MICRODISCECTOMY N/A 03/23/2016   Procedure: Lumbar three-four Laminectomy/Foraminotomy, posterior laterial arthrodesis;  Surgeon: Leeroy Cha, MD;  Location: [facility] NEURO ORS;  Service: Neurosurgery;  Laterality: N/A;  L3-4 Laminectomy/Foraminotomy  . THYROIDECTOMY  1962  . TONSILLECTOMY       No outpatient medications have been marked as taking for the 01/01/19 encounter (Appointment) with Minna Merritts, MD.   Current Facility-Administered Medications for the 01/01/19 encounter (Appointment) with Minna Merritts, MD  Medication  . cyanocobalamin ((VITAMIN B-12)) injection 1,000 mcg     Allergies:   Sulfonamide derivatives; Alendronate sodium; and Prednisone   Social History   Tobacco Use  . Smoking status: Former Smoker    Types: Cigarettes    Last attempt to quit: 09/14/1947    Years since quitting: 71.3  . Smokeless tobacco: Never Used  Substance Use Topics  . Alcohol use: No    Alcohol/week: 0.0 standard drinks  . Drug use: No     Current Outpatient Medications on File Prior to Visit  Medication Sig Dispense Refill  . docusate sodium (COLACE) 100 MG capsule Take 100 mg by mouth at bedtime.     [name] levothyroxine (SYNTHROID, LEVOTHROID) 88 MCG tablet TAKE ONE TABLET BY MOUTH EVERY DAY 90 tablet 1  . metoprolol tartrate (LOPRESSOR) 25 MG tablet Take 0.5 tablets (12.5 mg total) by mouth 2 (two) times daily. (Patient taking differently: Take 12.5 mg by mouth 2 (two) times daily. HOLD evening dose if BP is under 110) 180 tablet 3  . midodrine (PROAMATINE) 5 MG tablet TAKE ONE TABLET BY MOUTH TWICE DAILY WITH MEALS 180 tablet 3  . mirtazapine (REMERON) 15 MG tablet TAKE ONE TABLET AT BEDTIME 90 tablet 3  .  multivitamin-lutein (OCUVITE-LUTEIN) CAPS capsule Take 1 capsule by mouth daily.    . nitroGLYCERIN (NITROSTAT) 0.4 MG SL tablet Place 0.4 mg under the tongue every 5 (five) minutes as needed for chest pain (up to a max of 3).    . pantoprazole (PROTONIX) 40 MG tablet TAKE ONE TABLET BY MOUTH EVERY DAY 90 tablet 1  . Polyethyl Glycol-Propyl Glycol (SYSTANE OP) Apply 1 drop to eye daily.    . polyethylene glycol (MIRALAX / GLYCOLAX) packet Take 17 g by mouth daily. 14 each 0  . senna (SENOKOT) 8.6 MG tablet Take 1 tablet by mouth daily.    . simvastatin (ZOCOR) 10 MG tablet Take 10 mg by mouth daily.    [name] torsemide (DEMADEX) 20 MG tablet Take 20 mg by mouth daily as needed. Takes 1 pill if gains 5lbs or more in week or 1 pill if 2lbs or greater overnight.    . torsemide (DEMADEX) 20 MG tablet Take 1 tablet (20 mg total) by mouth as needed (Twice a week (Monday/Thursday) HOLD for weight less than 120 pounds). BMP & BNP in 3 to 4 weeks. Please fax to [phone] 30 tablet 3   Current Facility-Administered Medications on File Prior to Visit  Medication Dose Route Frequency Provider Last Rate Last Dose  . cyanocobalamin ((VITAMIN B-12)) injection 1,000 mcg  1,000 mcg Intramuscular Q30 days Tower, Wynelle Fanny, MD   1,000 mcg at 04/05/17 1100     Family Hx: The patient's family history includes Cancer in her brother; Diabetes in her brother; Heart disease in her sister; Heart disease (age of onset: 2) in her mother; Leukemia in her sister; Stroke (age of onset: 75) in her father.  ROS:   Please see the history of present illness.    ROS    Labs/Other Tests and Data Reviewed:    Recent Labs: No results found for requested labs within last 8760 hours.   Recent Lipid Panel Lab Results  Component Value Date/Time   CHOL 133 10/19/2017 12:11 PM   TRIG 55.0 10/19/2017 12:11 PM   HDL 49.40 10/19/2017 12:11 PM   CHOLHDL 3 10/19/2017 12:11 PM   LDLCALC 72 10/19/2017 12:11 PM    Wt Readings from  Last 3 Encounters:  05/01/18 124 lb 8 oz (56.5 kg)  11/15/17 111 lb (50.3 kg)  05/09/17 118 lb 1.6 oz (53.6 kg)     Exam:    Vital Signs: Vital signs may also be detailed in the HPI There were no vitals taken for this visit.  Wt Readings from Last 3 Encounters:  05/01/18 124 lb 8 oz (56.5  kg)  11/15/17 111 lb (50.3 kg)  05/09/17 118 lb 1.6 oz (53.6 kg)   Temp Readings from Last 3 Encounters:  11/29/17 (!) 97.3 F (36.3 C) (Oral)  11/15/17 97.7 F (36.5 C) (Oral)  10/19/17 97.8 F (36.6 C) (Oral)   BP Readings from Last 3 Encounters:  05/01/18 120/60  11/29/17 104/66  11/15/17 (!) 162/93   Pulse Readings from Last 3 Encounters:  05/01/18 76  11/29/17 78  11/15/17 79     Well nourished, well developed female in no acute distress. Constitutional:  oriented to person, place, and time. No distress.  Head: Normocephalic and atraumatic.  Eyes:  no discharge. No scleral icterus.  Neck: Normal range of motion. Neck supple.  Pulmonary/Chest: No audible wheezing, no distress, appears comfortable Musculoskeletal: Normal range of motion.  no  tenderness or deformity.  Neurological:   Coordination normal. Full exam not performed Skin:  No rash Psychiatric:  normal mood and affect. behavior is normal. Thought content normal.    ASSESSMENT & PLAN:    No diagnosis found.   COVID-19 Education: The signs and symptoms of COVID-19 were discussed with the patient and how to seek care for testing (follow up with PCP or arrange E-visit).  The importance of social distancing was discussed today.  Patient Risk:   After full review of this patients clinical status, I feel that they are at least moderate risk at this time.  Time:   Today, I have spent 25 minutes with the patient with telehealth technology discussing the cardiac and medical problems/diagnoses detailed above   10 min spent reviewing the chart prior to patient visit today   Medication Adjustments/Labs and Tests  Ordered: Current medicines are reviewed at length with the patient today.  Concerns regarding medicines are outlined above.   Tests Ordered: No tests ordered   Medication Changes: No changes made   Disposition: Follow-up in 6 months   Signed, Ida Rogue, MD  12/30/2018 1:31 PM    [place] Office [address] [place] [address], [place], [place] [address]

## 2019-01-01 ENCOUNTER — Other Ambulatory Visit: Payer: Self-pay

## 2019-01-01 ENCOUNTER — Telehealth: Payer: Medicare (Managed Care) | Admitting: Cardiovascular Disease

## 2019-01-01 ENCOUNTER — Telehealth: Payer: Self-pay | Admitting: Cardiovascular Disease

## 2019-01-01 NOTE — Telephone Encounter (Signed)
Spoke with Lutricia Horsfall from [employer].  Faxed consent to [facility] for them to review with patient. Awaiting for a call back for verbal consent.

## 2019-01-01 NOTE — Progress Notes (Signed)
Virtual Visit via Video Note   This visit type was conducted due to national recommendations for restrictions regarding the COVID-19 Pandemic (e.g. social distancing) in an effort to limit this patient's exposure and mitigate transmission in our community.  Due to her co-morbid illnesses, this patient is at least at moderate risk for complications without adequate follow up.  This format is felt to be most appropriate for this patient at this time.  All issues noted in this document were discussed and addressed.  A limited physical exam was performed with this format.  Please refer to the patient's chart for her consent to telehealth for [name].   I connected with  [name] on 01/01/19 by a video enabled telemedicine application and verified that I am speaking with the correct person using two identifiers. I discussed the limitations of evaluation and management by telemedicine. The patient expressed understanding and agreed to proceed.   Evaluation Performed:  Follow-up visit  Date:  01/01/2019   ID:  [name], DOB 01/02/1922, MRN [id number]  Patient Location:  [place] [address]   Provider location:   [facility], [place] office  PCP:  Kirk Ruths, MD  Cardiologist:  [place], [place]   Chief Complaint:  SOB, ABD fullness    History of Present Illness:    [name] is a 85 y.o. female who presents via audio/video conferencing for a telehealth visit today.   The patient does not symptoms concerning for COVID-19 infection (fever, chills, cough, or new SHORTNESS OF BREATH).   Patient has a past medical history of coronary artery disease,  atrial fibrillation on Coumadin,  chronic combined systolic and diastolic CHF, mitral regurgitation, tricuspid regurgitation,  prolonged QT, LBBB,  carotid disease,  HTN,  thrombocytopenia, Moderate pulmonary hypertension, ejection fraction 40-45% Prior  stress test with no ischemia Previous history of orthostatic hypotension, takes midodrine twice a day with improved symptoms She presents today for follow-up of her acute on chronic systolic and diastolic CHF and pulmonary hypertension  On her last clinic visit weight was trending upwards up 10 pounds from eating more But also reported having more shortness of breath It was recommended she take torsemide 20 mg twice a week and hold torsemide for weight less than 120 pounds  Warfarin held after hospitalization August 2018 in the setting of pneumonia, was placed on hospice. She was not discharged on her warfarin In office follow-up was changed to eliquis 2.5 twice a day Had a fall 2 days later, the back of her head Anticoagulation held on discharged from that Branch admission 05/10/2017 Continued on hospice  Discussion with the patient today, also confirmed with the nurse in the room No leg swelling Weight stable Moves in the room, walker Not down the hall Legs are too weak to do much walking She reports abdomen is tight but nurses report it is soft, no weight change, no leg swelling and no significant shortness of breath with her minimal exertion Nurses do not notice any new acute issues that need to be addressed   Other past medical history reviewed Branch admission January 2018 for shortness of breath,Flu positive, CHF exacerbation Weight at that time was 135 pounds on admission Discharged 10/14/2016 after diuresis and treatment of flu she was treated with IV Lasix while inpatient Weight now 122 up to 123 pounds She is taking Lasix every other day with potassium every other day Weight has been relatively stable,  now watching her salt intake  Other past medical history reviewed Underwent lumbar back surgery over the summer 2017, reports that she did well Pain has improved Limited mobility   Prior CV studies:   The following studies were reviewed today:  Last  echocardiogram July 2018 Ejection fraction 40-45% Anterior wall hypokinesis Moderate to severe MR Moderately dilated left atrium Severely elevated right heart pressures   Past Medical History:  Diagnosis Date  . Allergy   . Anemia   . Arthritis    osteo  . CAD (coronary artery disease)    a. 2 DES, [facility], 2004; b. nuc 2013 septal dyssenergy 2/2 LBBB, nl LV fxn;  c. 06/2015 MV: EF 41%, septal HK (LBBB), no ischemia->Low risk.  . Chronic atrial fibrillation (HCC)    a. on Coumadin (CHA2DS2VASc = 6); b. Holter 06/2010, no brady, mild tachy.  . Chronic systolic CHF (congestive heart failure) ([place])    a. 03/2015 Echo: EF 30-35%;  b. 01/2016 Echo: EF 30-35%.  . Compression fracture of lumbar vertebra ([place]) 05-2008  . COPD (chronic obstructive pulmonary disease) ([place])   . Diverticulosis of colon   . Dyslipidemia   . Essential hypertension   . Family history of adverse reaction to anesthesia    daughter PONV  . GERD (gastroesophageal reflux disease)   . Hard of hearing   . Hematemesis    a. 03/2015.  [name] Hx of colonoscopy   . Hypothyroid   . Ischemic cardiomyopathy    a. 03/2015 Echo: EF 30-35%, ant, antsept HK, mod MR, mildly dil LA/RV, mod dil RA, mod-sev TR, PASP 39mmHg;  b. 01/2016 Echo: EF 30-35%, ant/antsept HK, mildly dil Ao root (3.4cm) and Asc Ao (3.5cm), mild [email or web address] bi-atrial enlargement, mild to mod TR. PASP 9mmHg.  [name] LBBB (left bundle branch block)   . Lung abnormality    Dr [name] 122/2011 no further w/u needed  . Mitral regurgitation   . Myocardial infarction [facility]) 2016   "mild heart attack"  . Nausea    Some nausea with medications, May, 2013,  . Nocturia   . OA (osteoarthritis)   . Orthostatic hypotension    June, 2014  . Osteoporosis   . Pleural effusion associated with pulmonary infection 03-2007  . Prolonged QT interval    when on sotolol in past  . Pulmonary hypertension ([place])    a. 03/2015 PASP 73mmHg by echo.  . Shingles 2014  . Thrombocytopenia ([place])   .  TR (tricuspid regurgitation)    moderate, echo 2008  . Urinary frequency   . Vitamin B deficiency   . Warfarin anticoagulation   . Wears dentures   . Wears glasses   . Weight loss    August, 2012   Past Surgical History:  Procedure Laterality Date  . COLONOSCOPY    . CORONARY ANGIOPLASTY  2004  . LUMBAR LAMINECTOMY/DECOMPRESSION MICRODISCECTOMY N/A 03/23/2016   Procedure: Lumbar three-four Laminectomy/Foraminotomy, posterior laterial arthrodesis;  Surgeon: Leeroy Cha, MD;  Location: [facility];  Service: Neurosurgery;  Laterality: N/A;  L3-4 Laminectomy/Foraminotomy  . THYROIDECTOMY  1962  . TONSILLECTOMY       No outpatient medications have been marked as taking for the 01/02/19 encounter (Appointment) with Minna Merritts, MD.   Current Facility-Administered Medications for the 01/02/19 encounter (Appointment) with Minna Merritts, MD  Medication  . cyanocobalamin ((VITAMIN B-12)) injection 1,000 mcg     Allergies:   Sulfonamide derivatives; Alendronate sodium; and Prednisone   Social History   Tobacco Use  .  Smoking status: Former Smoker    Types: Cigarettes    Last attempt to quit: 09/14/1947    Years since quitting: 71.3  . Smokeless tobacco: Never Used  Substance Use Topics  . Alcohol use: No    Alcohol/week: 0.0 standard drinks  . Drug use: No     Current Outpatient Medications on File Prior to Visit  Medication Sig Dispense Refill  . docusate sodium (COLACE) 100 MG capsule Take 100 mg by mouth at bedtime.     [name] levothyroxine (SYNTHROID, LEVOTHROID) 88 MCG tablet TAKE ONE TABLET BY MOUTH EVERY DAY 90 tablet 1  . metoprolol tartrate (LOPRESSOR) 25 MG tablet Take 0.5 tablets (12.5 mg total) by mouth 2 (two) times daily. (Patient taking differently: Take 12.5 mg by mouth 2 (two) times daily. HOLD evening dose if BP is under 110) 180 tablet 3  . midodrine (PROAMATINE) 5 MG tablet TAKE ONE TABLET BY MOUTH TWICE DAILY WITH MEALS 180 tablet 3  . mirtazapine  (REMERON) 15 MG tablet TAKE ONE TABLET AT BEDTIME 90 tablet 3  . multivitamin-lutein (OCUVITE-LUTEIN) CAPS capsule Take 1 capsule by mouth daily.    . nitroGLYCERIN (NITROSTAT) 0.4 MG SL tablet Place 0.4 mg under the tongue every 5 (five) minutes as needed for chest pain (up to a max of 3).    . pantoprazole (PROTONIX) 40 MG tablet TAKE ONE TABLET BY MOUTH EVERY DAY 90 tablet 1  . Polyethyl Glycol-Propyl Glycol (SYSTANE OP) Apply 1 drop to eye daily.    . polyethylene glycol (MIRALAX / GLYCOLAX) packet Take 17 g by mouth daily. 14 each 0  . senna (SENOKOT) 8.6 MG tablet Take 1 tablet by mouth daily.    . simvastatin (ZOCOR) 10 MG tablet Take 10 mg by mouth daily.    [name] torsemide (DEMADEX) 20 MG tablet Take 20 mg by mouth daily as needed. Takes 1 pill if gains 5lbs or more in week or 1 pill if 2lbs or greater overnight.    . torsemide (DEMADEX) 20 MG tablet Take 1 tablet (20 mg total) by mouth as needed (Twice a week (Monday/Thursday) HOLD for weight less than 120 pounds). BMP & BNP in 3 to 4 weeks. Please fax to [phone] 30 tablet 3   Current Facility-Administered Medications on File Prior to Visit  Medication Dose Route Frequency Provider Last Rate Last Dose  . cyanocobalamin ((VITAMIN B-12)) injection 1,000 mcg  1,000 mcg Intramuscular Q30 days Tower, Wynelle Fanny, MD   1,000 mcg at 04/05/17 1100     Family Hx: The patient's family history includes Cancer in her brother; Diabetes in her brother; Heart disease in her sister; Heart disease (age of onset: 62) in her mother; Leukemia in her sister; Stroke (age of onset: 96) in her father.  ROS:   Please see the history of present illness.    Review of Systems  Constitutional: Negative.   Respiratory: Positive for shortness of breath.   Cardiovascular: Negative.   Gastrointestinal: Negative.   Musculoskeletal: Negative.   Neurological: Negative.   Psychiatric/Behavioral: Negative.   All other systems reviewed and are negative.     Labs/Other Tests and Data Reviewed:    Recent Labs: No results found for requested labs within last 8760 hours.   Recent Lipid Panel Lab Results  Component Value Date/Time   CHOL 133 10/19/2017 12:11 PM   TRIG 55.0 10/19/2017 12:11 PM   HDL 49.40 10/19/2017 12:11 PM   CHOLHDL 3 10/19/2017 12:11 PM   LDLCALC 72 10/19/2017 12:11  PM    Wt Readings from Last 3 Encounters:  05/01/18 124 lb 8 oz (56.5 kg)  11/15/17 111 lb (50.3 kg)  05/09/17 118 lb 1.6 oz (53.6 kg)     Exam:    Vital Signs: Vital signs may also be detailed in the HPI There were no vitals taken for this visit.  Wt Readings from Last 3 Encounters:  05/01/18 124 lb 8 oz (56.5 kg)  11/15/17 111 lb (50.3 kg)  05/09/17 118 lb 1.6 oz (53.6 kg)   Temp Readings from Last 3 Encounters:  11/29/17 (!) 97.3 F (36.3 C) (Oral)  11/15/17 97.7 F (36.5 C) (Oral)  10/19/17 97.8 F (36.6 C) (Oral)   BP Readings from Last 3 Encounters:  05/01/18 120/60  11/29/17 104/66  11/15/17 (!) 162/93   Pulse Readings from Last 3 Encounters:  05/01/18 76  11/29/17 78  11/15/17 79    Vitals 120s over 60s, heart rate 70s, respirations 16  Well nourished, well developed female in no acute distress. Constitutional:  oriented to person, place, and time. No distress.    ASSESSMENT & PLAN:    Chronic systolic CHF (congestive heart failure) (HCC) We will continue diuretic 2-3 times per week We have requested most recent lab work from [facility]  Pulmonary hypertension [facility]) Prior echocardiograms with moderately elevated right heart pressures She is minimally active, no significant leg edema, no change in weight Continue diuretics/torsemide every other day  Chronic obstructive pulmonary disease, unspecified COPD type ([place]) Chronic stable shortness of breath  Cardiomyopathy, ischemic Minimally active, no further ischemic work-up at this time  Persistent atrial fibrillation Not a good candidate for anticoagulation  given prior falls As detailed above Rate relatively well controlled   COVID-19 Education: The signs and symptoms of COVID-19 were discussed with the patient and how to seek care for testing (follow up with PCP or arrange E-visit).  The importance of social distancing was discussed today.  Patient Risk:   After full review of this patients clinical status, I feel that they are at least moderate risk at this time.  Time:   Today, I have spent 25 minutes with the patient with telehealth technology discussing the cardiac and medical problems/diagnoses detailed above   10 min spent reviewing the chart prior to patient visit today   Medication Adjustments/Labs and Tests Ordered: Current medicines are reviewed at length with the patient today.  Concerns regarding medicines are outlined above.   Tests Ordered: No tests ordered   Medication Changes: No changes made   Disposition: Follow-up in 6 months   Signed, Ida Rogue, MD  01/01/2019 6:17 PM    [place] Office [address], [place], [place] [address]

## 2019-01-01 NOTE — Telephone Encounter (Signed)
Schleicher back.  Gave a verbal that the patient consented to video visit.      Virtual Visit Pre-Appointment Phone Call  Steps For Call:  1. Confirm consent - "In the setting of the current Covid19 crisis, you are scheduled for a VIDEO visit with your provider on 01/02/2019 at 2:20PM.  Just as we do with many in-office visits, in order for you to participate in this visit, we must obtain consent.  If you'd like, I can send this to your mychart (if signed up) or email for you to review.  Otherwise, I can obtain your verbal consent now.  All virtual visits are billed to your insurance company just like a normal visit would be.  By agreeing to a virtual visit, we'd like you to understand that the technology does not allow for your provider to perform an examination, and thus may limit your provider's ability to fully assess your condition. If your provider identifies any concerns that need to be evaluated in person, we will make arrangements to do so.  Finally, though the technology is pretty good, we cannot assure that it will always work on either your or our end, and in the setting of a video visit, we may have to convert it to a phone-only visit.  In either situation, we cannot ensure that we have a secure connection.  Are you willing to proceed?" STAFF: Did the patient verbally acknowledge consent to telehealth visit? Document YES/NO here: YES  2. Confirm the BEST phone number to call the day of the visit by including in appointment notes  3. Give patient instructions for WebEx/MyChart download to smartphone as below or Doximity/Doxy.me if video visit (depending on what platform provider is using)  4. Advise patient to be prepared with their blood pressure, heart rate, weight, any heart rhythm information, their current medicines, and a piece of paper and pen handy for any instructions they may receive the day of their visit  5. Inform patient they will receive a phone call 15 minutes  prior to their appointment time (may be from unknown caller ID) so they should be prepared to answer  6. Confirm that appointment type is correct in Epic appointment notes (VIDEO vs PHONE)     TELEPHONE CALL NOTE  [name] has been deemed a candidate for a follow-up tele-health visit to limit community exposure during the Covid-19 pandemic. I spoke with the patient via phone to ensure availability of phone/video source, confirm preferred email & phone number, and discuss instructions and expectations.  I reminded [name] to be prepared with any vital sign and/or heart rhythm information that could potentially be obtained via home monitoring, at the time of her visit. I reminded [name] to expect a phone call at the time of her visit if her visit.  [name], [place] 01/01/2019 2:01 PM   INSTRUCTIONS FOR DOWNLOADING THE [place] APP TO SMARTPHONE  - If Apple, ask patient to go to [employer] and type in WebEx in the search bar. [employer], the blue/green circle. If Android, go to [employer] and type in [employer] in the search bar. The app is free but as with any other app downloads, their phone may require them to verify saved payment information or Apple/Android password.  - The patient does NOT have to create an account. - On the day of the visit, the assist will walk the patient through joining the meeting with the meeting  number/password.  INSTRUCTIONS FOR DOWNLOADING THE MYCHART APP TO SMARTPHONE  - The patient must first make sure to have activated MyChart and know their login information - If Apple, go to [employer] and type in MyChart in the search bar and download the app. If Android, ask patient to go to [employer] and type in [place] in the search bar and download the app. The app is free but as with any other app downloads, their phone may require them to verify saved payment information or Apple/Android password.  - The  patient will need to then log into the app with their MyChart username and password, and select [facility] as their healthcare provider to link the account. When it is time for your visit, go to the MyChart app, find appointments, and click Begin Video Visit. Be sure to Select Allow for your device to access the Microphone and Camera for your visit. You will then be connected, and your provider will be with you shortly.  **If they have any issues connecting, or need assistance please contact MyChart service desk [phone])**  **If using a computer, in order to ensure the best quality for their visit they will need to use either of the following Internet Browsers: [employer], or Google Chrome**  IF USING DOXIMITY or DOXY.ME - The patient will receive a link just prior to their visit, either by text or email (to be determined day of appointment depending on if it's doxy.me or Doximity).     FULL LENGTH CONSENT FOR TELE-HEALTH VISIT   I hereby voluntarily request, consent and authorize [facility] and its employed or contracted physicians, physician assistants, nurse practitioners or other licensed health care professionals (the Practitioner), to provide me with telemedicine health care services (the "Services") as deemed necessary by the treating Practitioner. I acknowledge and consent to receive the Services by the Practitioner via telemedicine. I understand that the telemedicine visit will involve communicating with the Practitioner through live audiovisual communication technology and the disclosure of certain medical information by electronic transmission. I acknowledge that I have been given the opportunity to request an in-person assessment or other available alternative prior to the telemedicine visit and am voluntarily participating in the telemedicine visit.  I understand that I have the right to withhold or withdraw my consent to the use of telemedicine in the course  of my care at any time, without affecting my right to future care or treatment, and that the Practitioner or I may terminate the telemedicine visit at any time. I understand that I have the right to inspect all information obtained and/or recorded in the course of the telemedicine visit and may receive copies of available information for a reasonable fee.  I understand that some of the potential risks of receiving the Services via telemedicine include:  [name] Delay or interruption in medical evaluation due to technological equipment failure or disruption; . Information transmitted may not be sufficient (e.g. poor resolution of images) to allow for appropriate medical decision making by the Practitioner; and/or  . In rare instances, security protocols could fail, causing a breach of personal health information.  Furthermore, I acknowledge that it is my responsibility to provide information about my medical history, conditions and care that is complete and accurate to the best of my ability. I acknowledge that Practitioner's advice, recommendations, and/or decision may be based on factors not within their control, such as incomplete or inaccurate data provided by me or distortions of diagnostic images or specimens  that may result from electronic transmissions. I understand that the practice of medicine is not an exact science and that Practitioner makes no warranties or guarantees regarding treatment outcomes. I acknowledge that I will receive a copy of this consent concurrently upon execution via email to the email address I last provided but may also request a printed copy by calling the office of [facility].    I understand that my insurance will be billed for this visit.   I have read or had this consent read to me. . I understand the contents of this consent, which adequately explains the benefits and risks of the Services being provided via telemedicine.  . I have been provided ample opportunity to  ask questions regarding this consent and the Services and have had my questions answered to my satisfaction. . I give my informed consent for the services to be provided through the use of telemedicine in my medical care  By participating in this telemedicine visit I agree to the above.'

## 2019-01-01 NOTE — Telephone Encounter (Signed)
Follow Up:    Claiborne Billings from [employer] says she has pt's permission to do a [employer]. Please call her at [phone] and let her know when you want to do it please.i

## 2019-01-02 ENCOUNTER — Telehealth (INDEPENDENT_AMBULATORY_CARE_PROVIDER_SITE_OTHER): Payer: Medicare (Managed Care) | Admitting: Cardiovascular Disease

## 2019-01-02 ENCOUNTER — Other Ambulatory Visit: Payer: Self-pay

## 2019-01-02 DIAGNOSIS — J449 Chronic obstructive pulmonary disease, unspecified: Secondary | ICD-10-CM | POA: Diagnosis not present

## 2019-01-02 DIAGNOSIS — E782 Mixed hyperlipidemia: Secondary | ICD-10-CM | POA: Diagnosis not present

## 2019-01-02 DIAGNOSIS — I5022 Chronic systolic (congestive) heart failure: Secondary | ICD-10-CM | POA: Diagnosis not present

## 2019-01-02 DIAGNOSIS — I255 Ischemic cardiomyopathy: Secondary | ICD-10-CM | POA: Diagnosis not present

## 2019-01-02 DIAGNOSIS — I272 Pulmonary hypertension, unspecified: Secondary | ICD-10-CM

## 2019-01-02 DIAGNOSIS — I4819 Other persistent atrial fibrillation: Secondary | ICD-10-CM

## 2019-01-02 NOTE — Patient Instructions (Signed)
Can we request most recent lab work from [facility] May need a letter with our fax number they will fax back This is for our records  Medication Instructions:  No changes  If you need a refill on your cardiac medications before your next appointment, please call your pharmacy.    Lab work: No new labs needed   If you have labs (blood work) drawn today and your tests are completely normal, you will receive your results only by: [name] MyChart Message (if you have MyChart) OR . A paper copy in the mail If you have any lab test that is abnormal or we need to change your treatment, we will call you to review the results.   Testing/Procedures: No new testing needed   Follow-Up: At [facility], you and your health needs are our priority.  As part of our continuing mission to provide you with exceptional heart care, we have created designated Provider Care Teams.  These Care Teams include your primary Cardiologist (physician) and Advanced Practice Providers (APPs -  Physician Assistants and Nurse Practitioners) who all work together to provide you with the care you need, when you need it.  . You will need a follow up appointment in 6 months .   Please call our office 2 months in advance to schedule this appointment.    . Providers on your designated Care Team:   . Murray Hodgkins, NP . Christell Faith, PA-C . Marrianne Mood, PA-C  Any Other Special Instructions Will Be Listed Below (If Applicable).  For educational health videos Log in to : [email or web address] Or : [email or web address], password : [email or web address]

## 2020-04-14 ENCOUNTER — Encounter: Payer: Self-pay | Admitting: Physician Assistant

## 2020-07-31 NOTE — Progress Notes (Signed)
Cardiology Office Note    Date:  08/01/2020   ID:  [name], DOB 1922-02-19, MRN [id number]  PCP:  [name] Ruths, MD  Cardiologist:  [name] Rogue, MD  Electrophysiologist:  None   Chief Complaint: Follow up  History of Present Illness:   [name] is a 86 y.o. female with history of CAD s/p remote PCI x 2 to the LAD as below in 2004, chronic Afib on Coumadin, chronic combined CHF, pulmonary hypertension, orthostatic hypotension on midodrine, mitral regurgitation, tricuspid regurgitation, prolonged QTc, LBBB, carotid artery disease, HTN, thrombocytopenia, and chronic back pain s/p lumbar decompression complicated by post-op hematoma with acute blood loss anemia who presents for follow-up of her CAD, cardiomyopathy, and A. fib.   Prior cardiac cath from 08/2003 at [facility] showed mid LAD with 75% stenosis s/p PCI/DES and distal LAD with 75% stenosis s/p PCI/DES.   She was admitted to [facility] in 03/2015 with volume overload requiring diuresis. Echo that admission showed a newly found cardiomyopathy with an EF of 30-35% (previously 50-55% in 2014). Given her frailty it was felt conservative maangement was best. She continued to have fatigue, thus underwent Lexiscan Myoview on 07/02/2015 that showed no ischemia, anteroseptal perfusion defect consistent with bundle branch block. Echo from 01/2016 showed an EF of 30 to 35%, HK of anteroseptal myocardium, mildly dilated aoritc root at 3.4 cm, mildly dilated ascending aorta at 3.5 cm, mild MR, mild to moderate TR, PASP 33 mm Hg. She was admitted to the hospital in 03/2017 with pneumonia. Echo at that time showed an EF of 40 to 45%, hypokinesis of the anteroseptal and anterior myocardium, moderate to severe mitral regurgitation, moderate biatrial enlargement, mildly dilated RV cavity size with normal RV systolic function, severe tricuspid regurgitation, and PASP 66 mmHg. She was placed on hospice. She was not discharged on warfarin. In  outpatient follow-up she was placed on Eliquis though had a fall 2 days later hitting the back of her head leading to readmission and anticoagulation being held at discharge.  More recently, she was last seen virtually in 12/2019 with stable weight. Note indicates she was too weak to do much walking. She was continued on torsemide 20 mg 2-3 times per week. She remains off anticoagulation due to history of traumatic fall.  She comes in accompanied by her daughter today.  Over the past several days to week she has noted progressive swelling in the lower extremities as well as exertional dyspnea and fatigue.  She notes a sharp chest pain that will occur randomly and last for several seconds with spontaneous resolution.  On 11/16 and given worsening lower extremity swelling and exertional dyspnea her torsemide was increased to include a 5 mg tab on Tuesdays and Thursdays with continuation of 20 mg torsemide on Monday, Wednesday, and Friday.  Torsemide has been held for a weight less than 120 pounds.  The patient spends a fair amount of her time sitting with her feet hanging down on the ground.  She does not have much of an appetite.   Labs independently reviewed: 04/2020 - Hgb 10.4, PLT 94, BUN 26, serum creatinine 0.99, potassium 3.9  Past Medical History:  Diagnosis Date  . Allergy   . Anemia   . Arthritis    osteo  . CAD (coronary artery disease)    a. 2 DES, [facility], 2004; b. nuc 2013 septal dyssenergy 2/2 LBBB, nl LV fxn;  c. 06/2015 MV: EF 41%, septal HK (LBBB), no ischemia->Low risk.  [name]  Chronic atrial fibrillation (HCC)    a. on Coumadin (CHA2DS2VASc = 6); b. Holter 06/2010, no brady, mild tachy.  . Chronic systolic CHF (congestive heart failure) ([place])    a. 03/2015 Echo: EF 30-35%;  b. 01/2016 Echo: EF 30-35%.  . Compression fracture of lumbar vertebra ([place]) 05-2008  . COPD (chronic obstructive pulmonary disease) ([place])   . Diverticulosis of colon   . Dyslipidemia   . Essential hypertension   .  Family history of adverse reaction to anesthesia    daughter PONV  . GERD (gastroesophageal reflux disease)   . Hard of hearing   . Hematemesis    a. 03/2015.  [name] Hx of colonoscopy   . Hypothyroid   . Ischemic cardiomyopathy    a. 03/2015 Echo: EF 30-35%, ant, antsept HK, mod MR, mildly dil LA/RV, mod dil RA, mod-sev TR, PASP 9mmHg;  b. 01/2016 Echo: EF 30-35%, ant/antsept HK, mildly dil Ao root (3.4cm) and Asc Ao (3.5cm), mild [email or web address] bi-atrial enlargement, mild to mod TR. PASP 84mmHg.  [name] LBBB (left bundle branch block)   . Lung abnormality    Dr [name] 122/2011 no further w/u needed  . Mitral regurgitation   . Myocardial infarction [facility]) 2016   "mild heart attack"  . Nausea    Some nausea with medications, May, 2013,  . Nocturia   . OA (osteoarthritis)   . Orthostatic hypotension    June, 2014  . Osteoporosis   . Pleural effusion associated with pulmonary infection 03-2007  . Prolonged QT interval    when on sotolol in past  . Pulmonary hypertension ([place])    a. 03/2015 PASP 76mmHg by echo.  . Shingles 2014  . Thrombocytopenia ([place])   . TR (tricuspid regurgitation)    moderate, echo 2008  . Urinary frequency   . Vitamin B deficiency   . Warfarin anticoagulation   . Wears dentures   . Wears glasses   . Weight loss    August, 2012    Past Surgical History:  Procedure Laterality Date  . COLONOSCOPY    . CORONARY ANGIOPLASTY  2004  . LUMBAR LAMINECTOMY/DECOMPRESSION MICRODISCECTOMY N/A 03/23/2016   Procedure: Lumbar three-four Laminectomy/Foraminotomy, posterior laterial arthrodesis;  Surgeon: [name] Cha, MD;  Location: [facility] NEURO ORS;  Service: Neurosurgery;  Laterality: N/A;  L3-4 Laminectomy/Foraminotomy  . THYROIDECTOMY  1962  . TONSILLECTOMY      Current Medications: Current Meds  Medication Sig  . albuterol (VENTOLIN HFA) 108 (90 Base) MCG/ACT inhaler   . ANORO ELLIPTA 62.5-25 MCG/INH AEPB Inhale 1 puff into the lungs daily.  [name] esomeprazole (NEXIUM) 20 MG  capsule Take 20 mg by mouth in the morning and at bedtime.  . fluticasone (FLONASE) 50 MCG/ACT nasal spray Place into both nostrils.  [name] guaiFENesin (MUCINEX) 600 MG 12 hr tablet Take 600 mg by mouth daily.  [name] ketotifen (ZADITOR) 0.025 % ophthalmic solution 1 drop 2 (two) times daily.  [name] levothyroxine (SYNTHROID, LEVOTHROID) 88 MCG tablet TAKE ONE TABLET BY MOUTH EVERY DAY  . metoprolol tartrate (LOPRESSOR) 25 MG tablet Take 0.5 tablets (12.5 mg total) by mouth 2 (two) times daily.  . midodrine (PROAMATINE) 5 MG tablet TAKE ONE TABLET BY MOUTH TWICE DAILY WITH MEALS  . multivitamin-lutein (OCUVITE-LUTEIN) CAPS capsule Take 1 capsule by mouth daily.  . nitroGLYCERIN (NITROSTAT) 0.4 MG SL tablet Place 0.4 mg under the tongue every 5 (five) minutes as needed for chest pain (up to a max of 3).  Vladimir Faster Glycol-Propyl Glycol (SYSTANE OP) Apply  1 drop to eye daily.  [name] senna (SENOKOT) 8.6 MG tablet Take 1 tablet by mouth daily.  . simvastatin (ZOCOR) 10 MG tablet Take 10 mg by mouth daily.  [name] torsemide (DEMADEX) 10 MG tablet Take 1 tablet (10mg ) by mouth every Tuesday and Thursday  . torsemide (DEMADEX) 20 MG tablet Take one tablet (20MG ) by mouth Monday Wednesday and Friday  . [DISCONTINUED] torsemide (DEMADEX) 10 MG tablet Take 0.5 tablet (5MG ) by mouth every Tuesday and Thursday   Current Facility-Administered Medications for the 08/01/20 encounter (Office Visit) with Rise Mu, PA-C  Medication  . cyanocobalamin ((VITAMIN B-12)) injection 1,000 mcg    Allergies:   Sulfonamide derivatives, Alendronate sodium, and Prednisone   Social History   Socioeconomic History  . Marital status: Widowed    Spouse name: Not on file  . Number of children: Not on file  . Years of education: Not on file  . Highest education level: Not on file  Occupational History  . Not on file  Tobacco Use  . Smoking status: Former Smoker    Types: Cigarettes    Quit date: 09/14/1947    Years since quitting:  72.9  . Smokeless tobacco: Never Used  Substance and Sexual Activity  . Alcohol use: No    Alcohol/week: 0.0 standard drinks  . Drug use: No  . Sexual activity: Not Currently  Other Topics Concern  . Not on file  Social History Narrative   Very independent    Lives in [place] by herself          Social Determinants of Health   Financial Resource Strain:   . Difficulty of Paying Living Expenses: Not on file  Food Insecurity:   . Worried About Charity fundraiser in the Last Year: Not on file  . Ran Out of Food in the Last Year: Not on file  Transportation Needs:   . Lack of Transportation (Medical): Not on file  . Lack of Transportation (Non-Medical): Not on file  Physical Activity:   . Days of Exercise per Week: Not on file  . Minutes of Exercise per Session: Not on file  Stress:   . Feeling of Stress : Not on file  Social Connections:   . Frequency of Communication with Friends and Family: Not on file  . Frequency of Social Gatherings with Friends and Family: Not on file  . Attends Religious Services: Not on file  . Active Member of Clubs or Organizations: Not on file  . Attends Archivist Meetings: Not on file  . Marital Status: Not on file     Family History:  The patient's family history includes Cancer in her brother; Diabetes in her brother; Heart disease in her sister; Heart disease (age of onset: 76) in her mother; Leukemia in her sister; Stroke (age of onset: 28) in her father.  ROS:   Review of Systems  Constitutional: Positive for malaise/fatigue. Negative for chills, diaphoresis, fever and weight loss.       Poor appetite  HENT: Negative for congestion.   Eyes: Negative for discharge and redness.  Respiratory: Positive for shortness of breath. Negative for cough, sputum production and wheezing.   Cardiovascular: Positive for chest pain and leg swelling. Negative for palpitations, orthopnea, claudication and PND.  Gastrointestinal: Negative for  abdominal pain, heartburn, nausea and vomiting.  Musculoskeletal: Negative for falls and myalgias.  Skin: Negative for rash.  Neurological: Positive for weakness. Negative for dizziness, tingling, tremors, sensory change, speech change,  focal weakness and loss of consciousness.  Endo/Heme/Allergies: Does not bruise/bleed easily.  Psychiatric/Behavioral: Negative for substance abuse. The patient is not nervous/anxious.   All other systems reviewed and are negative.    EKGs/Labs/Other Studies Reviewed:    Studies reviewed were summarized above. The additional studies were reviewed today: As above  EKG:  EKG is ordered today.  The EKG ordered today demonstrates A. fib, 67 bpm, rare PVC, LBBB (known), no significant change when compared to prior tracing  Recent Labs: No results found for requested labs within last 8760 hours.  Recent Lipid Panel    Component Value Date/Time   CHOL 133 10/19/2017 1211   TRIG 55.0 10/19/2017 1211   HDL 49.40 10/19/2017 1211   CHOLHDL 3 10/19/2017 1211   VLDL 11.0 10/19/2017 1211   LDLCALC 72 10/19/2017 1211    PHYSICAL EXAM:    VS:  BP 110/60   Pulse 67   Ht 5\' 7"  (1.702 m)   Wt 118 lb (53.5 kg)   SpO2 97%   BMI 18.48 kg/m   BMI: Body mass index is 18.48 kg/m.  Physical Exam Constitutional:      General: She is awake.     Appearance: She is cachectic.  HENT:     Head: Normocephalic and atraumatic.  Eyes:     General:        Right eye: No discharge.        Left eye: No discharge.  Neck:     Vascular: No JVD.  Cardiovascular:     Rate and Rhythm: Normal rate and regular rhythm.     Pulses: No midsystolic click and no opening snap.          Dorsalis pedis pulses are 1+ on the right side and 1+ on the left side.     Heart sounds: Normal heart sounds, S1 normal and S2 normal. Heart sounds not distant. No murmur heard.  No friction rub.  Pulmonary:     Effort: Pulmonary effort is normal. No respiratory distress.     Breath sounds:  Examination of the right-lower field reveals rales. Rales present. No decreased breath sounds or wheezing.  Chest:     Chest wall: No tenderness.  Abdominal:     General: There is no distension.     Palpations: Abdomen is soft.     Tenderness: There is no abdominal tenderness.  Musculoskeletal:     Cervical back: Normal range of motion.     Right lower leg: Edema present.     Left lower leg: Edema present.     Comments: 1-2+ bilateral lower extremity pitting edema/lymphedema with chronic woody appearance and hyperpigmentation  Skin:    General: Skin is warm and dry.     Nails: There is no clubbing.  Neurological:     Mental Status: She is alert and oriented to person, place, and time.  Psychiatric:        Speech: Speech normal.        Behavior: Behavior normal. Behavior is cooperative.        Thought Content: Thought content normal.        Judgment: Judgment normal.     Wt Readings from Last 3 Encounters:  08/01/20 118 lb (53.5 kg)  05/01/18 124 lb 8 oz (56.5 kg)  11/15/17 111 lb (50.3 kg)     ASSESSMENT & PLAN:   1. CAD involving the native coronary arteries without angina: Chest pain it appears to be atypical and not consistent with  angina.  Not currently on ASA which will be deferred to her primary cardiologist.  Continue metoprolol.  No plans for ischemic evaluation at this time given lack of anginal symptoms, advanced age, and cachectic state.  2. Chronic combined systolic and diastolic CHF secondary to ICM/pulmonary hypertension: NYHA class is somewhat difficult to assess secondary to decreased functional status and cachectic state.  I suspect her exertional dyspnea is multifactorial including deconditioning, CHF, pulmonary hypertension, possible COPD, and underlying anemia.  I do not think obtaining an echo at this time would significantly alter her course of management given her advanced age and cachectic state.  We did agree to increase her torsemide today in an effort to  gently diurese her by having her take 10 mg of torsemide on 11/20 and 11/21 with continuation of 20 mg torsemide on Monday, Wednesday, Friday and increasing her torsemide on Tuesdays and Thursdays from 5 mg to 10 mg.  She will hold torsemide for a weight less than 120 pounds in an effort to prevent dehydration with worsening orthostasis and a potential fall.  BMP will be obtained on 11/26 or 11/29 as outlined below and faxed to our office.  3. Permanent A. Fib: She remains in A. fib with controlled ventricular response.  Continue rate control with Lopressor 12.5 mg twice daily.  CHA2DS2-VASc 5.  No longer on [place] secondary to history of traumatic fall.  4. Lower extremity edema: Likely multifactorial including venous insufficiency with lymphedema, chronic dependent edema, and a component of CHF.  Recommend leg elevation in a recliner with pillows underneath her feet with gentle diuresis as outlined above.    5. Orthostatic hypotension: She remains on midodrine 5 mg twice daily.  Will need to be cautious with gentle diuresis.  6. Valvular heart disease: Possibly contributing to some degree of her dyspnea.  Given her advanced age and frail state even if she had severe valvular heart disease she would not likely be a candidate for invasive repair.  7. CKD stage III: Most recent creatinine stable at 0.99 as outlined above.  Followed by PCP.  BMP to be drawn at her living facility on 11/26 or 11/29 and faxed to our office.  Number was provided on her paperwork today.   Disposition: F/u with Dr. Rockey Situ only in 1 month.   Medication Adjustments/Labs and Tests Ordered: Current medicines are reviewed at length with the patient today.  Concerns regarding medicines are outlined above. Medication changes, Labs and Tests ordered today are summarized above and listed in the Patient Instructions accessible in Encounters.   Signed, Christell Faith, PA-C 08/01/2020 3:16 PM     [place] [address], [place] [address] [phone]

## 2020-08-01 ENCOUNTER — Other Ambulatory Visit: Payer: Self-pay

## 2020-08-01 ENCOUNTER — Encounter: Payer: Self-pay | Admitting: Physician Assistant

## 2020-08-01 ENCOUNTER — Ambulatory Visit (INDEPENDENT_AMBULATORY_CARE_PROVIDER_SITE_OTHER): Payer: Medicare (Managed Care) | Admitting: Physician Assistant

## 2020-08-01 VITALS — BP 110/60 | HR 67 | Ht 67.0 in | Wt 118.0 lb

## 2020-08-01 DIAGNOSIS — I509 Heart failure, unspecified: Secondary | ICD-10-CM

## 2020-08-01 DIAGNOSIS — I4821 Permanent atrial fibrillation: Secondary | ICD-10-CM

## 2020-08-01 DIAGNOSIS — M7989 Other specified soft tissue disorders: Secondary | ICD-10-CM

## 2020-08-01 DIAGNOSIS — I951 Orthostatic hypotension: Secondary | ICD-10-CM

## 2020-08-01 DIAGNOSIS — I2510 Atherosclerotic heart disease of native coronary artery without angina pectoris: Secondary | ICD-10-CM | POA: Diagnosis not present

## 2020-08-01 DIAGNOSIS — I5042 Chronic combined systolic (congestive) and diastolic (congestive) heart failure: Secondary | ICD-10-CM

## 2020-08-01 DIAGNOSIS — I272 Pulmonary hypertension, unspecified: Secondary | ICD-10-CM | POA: Diagnosis not present

## 2020-08-01 DIAGNOSIS — I38 Endocarditis, valve unspecified: Secondary | ICD-10-CM

## 2020-08-01 DIAGNOSIS — N183 Chronic kidney disease, stage 3 unspecified: Secondary | ICD-10-CM

## 2020-08-01 MED ORDER — TORSEMIDE 10 MG PO TABS: ORAL_TABLET | ORAL | 3 refills | Status: DC

## 2020-08-01 NOTE — Patient Instructions (Addendum)
Medication Instructions:  Your physician has recommended you make the following change in your medication:   1) INCREASE Torsemide to 10mg  (1 tablet) Every Tuesday and Thursday  2) CONTINUE Torsemide 20mg  (1 tablet) Every Monday, Wednesday, Friday  2) TAKE Torsemide 10mg  (1 tablet) THIS Saturday and Sunday ONLY (11/20      &11/21) THEN resume regular dose (above).   *If you need a refill on your cardiac medications before your next appointment, please call your pharmacy*   Lab Work:  Your physician recommends that you return for lab work NEXT Friday (11/26) or Monday (11/29) - [facility]   -  Please go to the [facility]. You will check in at the front desk to the right as you walk into the atrium. Valet Parking is offered if needed. - No appointment needed. You may go any day between 7 am and 6 pm.    Testing/Procedures: None ordered.   Follow-Up: At [facility], you and your health needs are our priority.  As part of our continuing mission to provide you with exceptional heart care, we have created designated Provider Care Teams.  These Care Teams include your primary Cardiologist (physician) and Advanced Practice Providers (APPs -  Physician Assistants and Nurse Practitioners) who all work together to provide you with the care you need, when you need it.  We recommend signing up for the patient portal called "MyChart".  Sign up information is provided on this After Visit Summary.  MyChart is used to connect with patients for Virtual Visits (Telemedicine).  Patients are able to view lab/test results, encounter notes, upcoming appointments, etc.  Non-urgent messages can be sent to your provider as well.   To learn more about what you can do with MyChart, go to [email or web address].    Your next appointment:   1 month(s)  The format for your next appointment:   In Person  Provider:   Ida Rogue, MD

## 2020-08-11 ENCOUNTER — Encounter: Payer: Self-pay | Admitting: Cardiovascular Disease

## 2020-09-02 ENCOUNTER — Encounter: Payer: Self-pay | Admitting: Cardiovascular Disease

## 2020-09-09 NOTE — Progress Notes (Signed)
Date:  09/09/2020   ID:  [name], DOB 01-Dec-1921, MRN [id number]  Patient Location:  [address] [place] [address]   Provider location:   [facility], [place] office  PCP:  Lauro Regulus, MD  Cardiologist:  [facility]  Chief Complaint  Patient presents with   Follow-up    1 month; LE edema.      History of Present Illness:    [name] is a 86 y.o. female  past medical history of coronary artery disease,  atrial fibrillation on Coumadin,  chronic combined systolic and diastolic CHF, mitral regurgitation, tricuspid regurgitation,  prolonged QT, LBBB,  carotid disease,  HTN,  thrombocytopenia, Moderate pulmonary hypertension, ejection fraction 40-45% Prior stress test with no ischemia Previous history of orthostatic hypotension, takes midodrine twice a day with improved symptoms She presents today for follow-up of her acute on chronic systolic and diastolic CHF and pulmonary hypertension  Seen in clinic 04/2018 by myself Televisit: 2020  Seen by one of our providers: 08/01/2020  She presents today with family Family reports worsening lower extremity edema  Medications reviewed, she is taking torsemide 20 mg on Monday Wednesday Friday holding the medication for weight less than 115 Takes torsemide 10 on Tuesday Thursday Unclear what she does on the weekends  She presents in wheelchair today Family reports she is not eating well Legs too weak to walk very far  EKG personally reviewed by myself on todays visit Shows atrial fibrillation ventricular rate 86 bpm nonspecific interventricular conduction delay    Past Medical History:  Diagnosis Date   Allergy    Anemia    Arthritis    osteo   CAD (coronary artery disease)    a. 2 DES, [facility], 2004; b. nuc 2013 septal dyssenergy 2/2 LBBB, nl LV fxn;  c. 06/2015 MV: EF 41%, septal HK (LBBB), no ischemia->Low risk.   Chronic atrial fibrillation  (HCC)    a. on Coumadin (CHA2DS2VASc = 6); b. Holter 06/2010, no brady, mild tachy.   Chronic systolic CHF (congestive heart failure) (HCC)    a. 03/2015 Echo: EF 30-35%;  b. 01/2016 Echo: EF 30-35%.   Compression fracture of lumbar vertebra (HCC) 05-2008   COPD (chronic obstructive pulmonary disease) (HCC)    Diverticulosis of colon    Dyslipidemia    Essential hypertension    Family history of adverse reaction to anesthesia    daughter PONV   GERD (gastroesophageal reflux disease)    Hard of hearing    Hematemesis    a. 03/2015.   Hx of colonoscopy    Hypothyroid    Ischemic cardiomyopathy    a. 03/2015 Echo: EF 30-35%, ant, antsept HK, mod MR, mildly dil LA/RV, mod dil RA, mod-sev TR, PASP ;  b. 01/2016 Echo: EF 30-35%, ant/antsept HK, mildly dil Ao root (3.4cm) and Asc Ao (3.5cm), mild [employer] bi-atrial enlargement, mild to mod TR. PASP .   LBBB (left bundle branch block)    Lung abnormality    Dr Shelle Iron 122/2011 no further w/u needed   Mitral regurgitation    Myocardial infarction [facility]) 2016   "mild heart attack"   Nausea    Some nausea with medications, May, 2013,   Nocturia    OA (osteoarthritis)    Orthostatic hypotension    June, 2014   Osteoporosis    Pleural effusion associated with pulmonary infection 03-2007   Prolonged QT interval    when  on sotolol in past   Pulmonary hypertension (HCC)    a. 03/2015 PASP by echo.   Shingles 2014   Thrombocytopenia (HCC)    TR (tricuspid regurgitation)    moderate, echo 2008   Urinary frequency    Vitamin B deficiency    Warfarin anticoagulation    Wears dentures    Wears glasses    Weight loss    August, 2012   Past Surgical History:  Procedure Laterality Date   COLONOSCOPY     CORONARY ANGIOPLASTY  2004   LUMBAR LAMINECTOMY/DECOMPRESSION MICRODISCECTOMY N/A 03/23/2016   Procedure: Lumbar three-four Laminectomy/Foraminotomy, posterior laterial arthrodesis;  Surgeon:  Hilda Lias, MD;  Location: [facility] NEURO [facility]S;  Service: Neurosurgery;  Laterality: N/A;  L3-4 Laminectomy/Foraminotomy   THYROIDECTOMY  1962   TONSILLECTOMY       No outpatient medications have been marked as taking for the 09/10/20 encounter (Appointment) with Antonieta Iba, MD.   Current Facility-Administered Medications for the 09/10/20 encounter (Appointment) with Antonieta Iba, MD  Medication   cyanocobalamin ((VITAMIN B-12)) injection 1,000 mcg     Allergies:   Sulfonamide derivatives, Alendronate sodium, and Prednisone   Social History   Tobacco Use   Smoking status: Former Smoker    Types: Cigarettes    Quit date: 09/14/1947    Years since quitting: 73.0   Smokeless tobacco: Never Used  Substance Use Topics   Alcohol use: No    Alcohol/week: 0.0 standard drinks   Drug use: No     Current Outpatient Medications on File Prior to Visit  Medication Sig Dispense Refill   albuterol (VENTOLIN HFA) 108 (90 Base) MCG/ACT inhaler      ANORO ELLIPTA 62.5-25 MCG/INH AEPB Inhale 1 puff into the lungs daily.     esomeprazole (NEXIUM) 20 MG capsule Take 20 mg by mouth in the morning and at bedtime.     fluticasone (FLONASE) 50 MCG/ACT nasal spray Place into both nostrils.     guaiFENesin (MUCINEX) 600 MG 12 hr tablet Take 600 mg by mouth daily.     ketotifen (ZADITOR) 0.025 % ophthalmic solution 1 drop 2 (two) times daily.     levothyroxine (SYNTHROID, LEVOTHROID) 88 MCG tablet TAKE ONE TABLET BY MOUTH EVERY DAY 90 tablet 1   metoprolol tartrate (LOPRESSOR) 25 MG tablet Take 0.5 tablets (12.5 mg total) by mouth 2 (two) times daily. 180 tablet 3   midodrine (PROAMATINE) 5 MG tablet TAKE ONE TABLET BY MOUTH TWICE DAILY WITH MEALS 180 tablet 3   multivitamin-lutein (OCUVITE-LUTEIN) CAPS capsule Take 1 capsule by mouth daily.     nitroGLYCERIN (NITROSTAT) 0.4 MG SL tablet Place 0.4 mg under the tongue every 5 (five) minutes as needed for chest pain (up to a max of  3).     Polyethyl Glycol-Propyl Glycol (SYSTANE OP) Apply 1 drop to eye daily.     senna (SENOKOT) 8.6 MG tablet Take 1 tablet by mouth daily.     simvastatin (ZOCOR) 10 MG tablet Take 10 mg by mouth daily.     torsemide (DEMADEX) 10 MG tablet Take 1 tablet (10mg ) by mouth every Tuesday and Thursday 30 tablet 3   torsemide (DEMADEX) 20 MG tablet Take one tablet (20MG ) by mouth Monday Wednesday and Friday     Current Facility-Administered Medications on File Prior to Visit  Medication Dose Route Frequency Provider Last Rate Last Admin   cyanocobalamin ((VITAMIN B-12)) injection 1,000 mcg  1,000 mcg Intramuscular Q30 days [place], Saturday, MD  1,000 mcg at 04/05/17 1100     Family Hx: The patient's family history includes Cancer in her brother; Diabetes in her brother; Heart disease in her sister; Heart disease (age of onset: 7) in her mother; Leukemia in her sister; Stroke (age of onset: 18) in her father.  ROS:   Please see the history of present illness.    Review of Systems  Constitutional: Negative.   Respiratory: Positive for shortness of breath.   Cardiovascular: Negative.   Gastrointestinal: Negative.   Musculoskeletal: Negative.   Neurological: Negative.   Psychiatric/Behavioral: Negative.   All other systems reviewed and are negative.    Labs/Other Tests and Data Reviewed:    Recent Labs: No results found for requested labs within last 8760 hours.   Recent Lipid Panel Lab Results  Component Value Date/Time   CHOL 133 10/19/2017 12:11 PM   TRIG 55.0 10/19/2017 12:11 PM   HDL 49.40 10/19/2017 12:11 PM   CHOLHDL 3 10/19/2017 12:11 PM   LDLCALC 72 10/19/2017 12:11 PM    Wt Readings from Last 3 Encounters:  08/01/20 118 lb (53.5 kg)  05/01/18 124 lb 8 oz (56.5 kg)  11/15/17 111 lb (50.3 kg)     Exam:    BP 100/62 (BP Location: Left Arm, Patient Position: Sitting, Cuff Size: Normal)    Pulse 86    Ht 5\' 7"  (1.702 m)    Wt 114 lb (51.7 kg)    SpO2 97%    BMI  17.85 kg/m  Constitutional:  oriented to person, place, and time. No distress.  HENT:  Head: Grossly normal Eyes:  no discharge. No scleral icterus.  Neck: No JVD, no carotid bruits  Cardiovascular: Regular rate and rhythm, no murmurs appreciated Pulmonary/Chest: Clear to auscultation bilaterally, no wheezes or rails Abdominal: Soft.  no distension.  no tenderness.  Musculoskeletal: Normal range of motion Neurological:  normal muscle tone. Coordination normal. No atrophy Skin: Skin warm and dry Psychiatric: normal affect, pleasant   ASSESSMENT & PLAN:    Chronic systolic CHF (congestive heart failure) (HCC) Recommend torsemide 20 mg 3 days a week , holding the diuretic for weight less than 113 pounds down from 115 pounds Continue torsemide 10 mg on Tuesday Thursday Leg elevation, consider Ace wraps  Pulmonary hypertension (HCC) moderately elevated right heart pressures on prior echoes Continue aggressive diuresis as above  Chronic obstructive pulmonary disease, unspecified COPD type ([place]) Chronic stable shortness of breath Minimally active  Cardiomyopathy, ischemic No further ischemic work-up  Persistent atrial fibrillation Not a good candidate for anticoagulation given prior falls   Total encounter time more than 25 minutes  Greater than 50% was spent in counseling and coordination of care with the patient   Signed, Ida Rogue, MD  09/09/2020 1:35 PM    [place] Office [address] [place] [address], [place], [place] [address]

## 2020-09-10 ENCOUNTER — Other Ambulatory Visit: Payer: Self-pay

## 2020-09-10 ENCOUNTER — Encounter: Payer: Self-pay | Admitting: Cardiovascular Disease

## 2020-09-10 ENCOUNTER — Ambulatory Visit (INDEPENDENT_AMBULATORY_CARE_PROVIDER_SITE_OTHER): Payer: Medicare (Managed Care) | Admitting: Cardiovascular Disease

## 2020-09-10 VITALS — BP 100/62 | HR 86 | Ht 67.0 in | Wt 114.0 lb

## 2020-09-10 DIAGNOSIS — I272 Pulmonary hypertension, unspecified: Secondary | ICD-10-CM

## 2020-09-10 DIAGNOSIS — I951 Orthostatic hypotension: Secondary | ICD-10-CM | POA: Diagnosis not present

## 2020-09-10 DIAGNOSIS — I2510 Atherosclerotic heart disease of native coronary artery without angina pectoris: Secondary | ICD-10-CM

## 2020-09-10 DIAGNOSIS — N183 Chronic kidney disease, stage 3 unspecified: Secondary | ICD-10-CM

## 2020-09-10 DIAGNOSIS — I509 Heart failure, unspecified: Secondary | ICD-10-CM

## 2020-09-10 DIAGNOSIS — J449 Chronic obstructive pulmonary disease, unspecified: Secondary | ICD-10-CM

## 2020-09-10 DIAGNOSIS — I5042 Chronic combined systolic (congestive) and diastolic (congestive) heart failure: Secondary | ICD-10-CM

## 2020-09-10 DIAGNOSIS — I4821 Permanent atrial fibrillation: Secondary | ICD-10-CM | POA: Diagnosis not present

## 2020-09-10 DIAGNOSIS — M7989 Other specified soft tissue disorders: Secondary | ICD-10-CM

## 2020-09-10 DIAGNOSIS — I38 Endocarditis, valve unspecified: Secondary | ICD-10-CM

## 2020-09-10 MED ORDER — MIDODRINE HCL 5 MG PO TABS: 5.0000 mg | ORAL_TABLET | Freq: Three times a day (TID) | ORAL | 3 refills | Status: AC

## 2020-09-10 NOTE — Patient Instructions (Addendum)
[facility]: Please fax labs from Sep 02, 2020 for our system or fax to [phone] we tried to call, but was disconnected-thanks   Medication Instructions:  Please give torsemide 20 mg on Monday/Wednesday/Friday  Hold for weight <113  Stay on torsemide 10 mg Tuesday/Thursday  Please increase the midodrine up to 5 mg  TID (3 times a day) (8 Am, 1 pm , 6 pm)   If you need a refill on your cardiac medications before your next appointment, please call your pharmacy.    Lab work: No new labs needed   If you have labs (blood work) drawn today and your tests are completely normal, you will receive your results only by: [name] MyChart Message (if you have MyChart) OR . A paper copy in the mail If you have any lab test that is abnormal or we need to change your treatment, we will call you to review the results.   Testing/Procedures: No new testing needed   Follow-Up: At [facility], you and your health needs are our priority.  As part of our continuing mission to provide you with exceptional heart care, we have created designated Provider Care Teams.  These Care Teams include your primary Cardiologist (physician) and Advanced Practice Providers (APPs -  Physician Assistants and Nurse Practitioners) who all work together to provide you with the care you need, when you need it.  . You will need a follow up appointment in 6 months  . Providers on your designated Care Team:   . Nicolasa Ducking, NP . Eula Listen, PA-C . Marisue Ivan, PA-C  Any Other Special Instructions Will Be Listed Below (If Applicable).  COVID-19 Vaccine Information can be found at: PodExchange.nl For questions related to vaccine distribution or appointments, please email [email or web address] or call [phone].

## 2021-03-10 ENCOUNTER — Other Ambulatory Visit: Payer: Self-pay

## 2021-03-10 ENCOUNTER — Ambulatory Visit (INDEPENDENT_AMBULATORY_CARE_PROVIDER_SITE_OTHER): Payer: Medicare (Managed Care) | Admitting: Cardiovascular Disease

## 2021-03-10 ENCOUNTER — Encounter: Payer: Self-pay | Admitting: Cardiovascular Disease

## 2021-03-10 VITALS — BP 104/60 | HR 69 | Ht 66.0 in | Wt 103.1 lb

## 2021-03-10 DIAGNOSIS — I2510 Atherosclerotic heart disease of native coronary artery without angina pectoris: Secondary | ICD-10-CM | POA: Diagnosis not present

## 2021-03-10 DIAGNOSIS — I5022 Chronic systolic (congestive) heart failure: Secondary | ICD-10-CM

## 2021-03-10 DIAGNOSIS — I4821 Permanent atrial fibrillation: Secondary | ICD-10-CM

## 2021-03-10 DIAGNOSIS — E782 Mixed hyperlipidemia: Secondary | ICD-10-CM

## 2021-03-10 DIAGNOSIS — I272 Pulmonary hypertension, unspecified: Secondary | ICD-10-CM

## 2021-03-10 MED ORDER — TORSEMIDE 20 MG PO TABS: 10.0000 mg | ORAL_TABLET | ORAL | 3 refills | Status: AC

## 2021-03-10 NOTE — Patient Instructions (Addendum)
Needs labs from [facility], please have [facility] fax over labs to [phone]  Medication Instructions:  No changes, please continue your current medications   If you need a refill on your cardiac medications before your next appointment, please call your pharmacy.   Lab work: No new labs needed  Testing/Procedures: No new testing needed   Follow-Up: At [facility], you and your health needs are our priority.  As part of our continuing mission to provide you with exceptional heart care, we have created designated Provider Care Teams.  These Care Teams include your primary Cardiologist (physician) and Advanced Practice Providers (APPs -  Physician Assistants and Nurse Practitioners) who all work together to provide you with the care you need, when you need it.  You will need a follow up appointment in 6 months  Providers on your designated Care Team:   Murray Hodgkins, NP Christell Faith, PA-C Marrianne Mood, PA-C [place]ence [place], [place]  COVID-19 Vaccine Information can be found at: [email or web address].uk For questions related to vaccine distribution or appointments, please email [email or web address] or call [phone].

## 2021-03-10 NOTE — Progress Notes (Signed)
Date:  03/10/2021   ID:  [name], DOB 1922/03/30, MRN [id number]  Patient Location:  [place] [address]   Provider location:   [facility], [place] office  PCP:  Kirk Ruths, MD  Cardiologist:  Arvid Right [facility]  Chief Complaint  Patient presents with   Other    6 month follow up. Patient c.o swelling in ankles. Meds reviewed verbally with patient.      History of Present Illness:    [name] is a 87 y.o. female  past medical history of coronary artery disease,  atrial fibrillation on Coumadin,  chronic combined systolic and diastolic CHF, mitral regurgitation, tricuspid regurgitation,  prolonged QT, LBBB,  carotid disease,  HTN,  thrombocytopenia, Moderate pulmonary hypertension, ejection fraction 40-45% Prior stress test with no ischemia Previous history of orthostatic hypotension, takes midodrine twice a day with improved symptoms She presents today for follow-up of her acute on chronic systolic and diastolic CHF and pulmonary hypertension  At [facility] Drinking mighty shakes 5x a day Family brings in ensure Weight down, was 120 now down to 103 Weight stabilized since mid April Legs weak Uses a walker, does not walk the hall No falls  No leg edema On midodrine TID  for BP support Takes torsemide 10 on Tuesday Thursday  Recent lab work not available  EKG personally reviewed by myself on todays visit Shows atrial fibrillation ventricular rate 69 bpm nonspecific interventricular conduction delay     Past Medical History:  Diagnosis Date   Allergy    Anemia    Arthritis    osteo   CAD (coronary artery disease)    a. 2 DES, [facility], 2004; b. nuc 2013 septal dyssenergy 2/2 LBBB, nl LV fxn;  c. 06/2015 MV: EF 41%, septal HK (LBBB), no ischemia->Low risk.   Chronic atrial fibrillation (HCC)    a. on Coumadin (CHA2DS2VASc = 6); b. Holter 06/2010, no brady, mild tachy.   Chronic  systolic CHF (congestive heart failure) ([place])    a. 03/2015 Echo: EF 30-35%;  b. 01/2016 Echo: EF 30-35%.   Compression fracture of lumbar vertebra ([place]) 05-2008   COPD (chronic obstructive pulmonary disease) (HCC)    Diverticulosis of colon    Dyslipidemia    Essential hypertension    Family history of adverse reaction to anesthesia    daughter PONV   GERD (gastroesophageal reflux disease)    Hard of hearing    Hematemesis    a. 03/2015.   Hx of colonoscopy    Hypothyroid    Ischemic cardiomyopathy    a. 03/2015 Echo: EF 30-35%, ant, antsept HK, mod MR, mildly dil LA/RV, mod dil RA, mod-sev TR, PASP 21mmHg;  b. 01/2016 Echo: EF 30-35%, ant/antsept HK, mildly dil Ao root (3.4cm) and Asc Ao (3.5cm), mild [email or web address] bi-atrial enlargement, mild to mod TR. PASP 28mmHg.   LBBB (left bundle branch block)    Lung abnormality    Dr [name] 122/2011 no further w/u needed   Mitral regurgitation    Myocardial infarction [facility]) 2016   "mild heart attack"   Nausea    Some nausea with medications, May, 2013,   Nocturia    OA (osteoarthritis)    Orthostatic hypotension    June, 2014   Osteoporosis    Pleural effusion associated with pulmonary infection 03-2007   Prolonged QT interval    when on sotolol in past   Pulmonary hypertension ([place])  a. 03/2015 PASP 44mmHg by echo.   Shingles 2014   Thrombocytopenia (HCC)    TR (tricuspid regurgitation)    moderate, echo 2008   Urinary frequency    Vitamin B deficiency    Warfarin anticoagulation    Wears dentures    Wears glasses    Weight loss    August, 2012   Past Surgical History:  Procedure Laterality Date   COLONOSCOPY     CORONARY ANGIOPLASTY  2004   LUMBAR LAMINECTOMY/DECOMPRESSION MICRODISCECTOMY N/A 03/23/2016   Procedure: Lumbar three-four Laminectomy/Foraminotomy, posterior laterial arthrodesis;  Surgeon: Leeroy Cha, MD;  Location: [facility] NEURO ORS;  Service: Neurosurgery;  Laterality: N/A;  L3-4 Laminectomy/Foraminotomy   THYROIDECTOMY   1962   TONSILLECTOMY       Current Meds  Medication Sig   albuterol (VENTOLIN HFA) 108 (90 Base) MCG/ACT inhaler    ANORO ELLIPTA 62.5-25 MCG/INH AEPB Inhale 1 puff into the lungs daily.   esomeprazole (NEXIUM) 20 MG capsule Take 20 mg by mouth in the morning and at bedtime.   fluticasone (FLONASE) 50 MCG/ACT nasal spray Place into both nostrils.   guaiFENesin (MUCINEX) 600 MG 12 hr tablet Take 600 mg by mouth daily.   ketotifen (ZADITOR) 0.025 % ophthalmic solution 1 drop 2 (two) times daily.   levothyroxine (SYNTHROID, LEVOTHROID) 88 MCG tablet TAKE ONE TABLET BY MOUTH EVERY DAY   metoprolol tartrate (LOPRESSOR) 25 MG tablet Take 0.5 tablets (12.5 mg total) by mouth 2 (two) times daily.   midodrine (PROAMATINE) 5 MG tablet Take 1 tablet (5 mg total) by mouth 3 (three) times daily with meals.   multivitamin-lutein (OCUVITE-LUTEIN) CAPS capsule Take 1 capsule by mouth daily.   nitroGLYCERIN (NITROSTAT) 0.4 MG SL tablet Place 0.4 mg under the tongue every 5 (five) minutes as needed for chest pain (up to a max of 3).   Polyethyl Glycol-Propyl Glycol (SYSTANE OP) Apply 1 drop to eye daily.   senna (SENOKOT) 8.6 MG tablet Take 1 tablet by mouth daily.   simvastatin (ZOCOR) 10 MG tablet Take 10 mg by mouth daily.   torsemide (DEMADEX) 20 MG tablet Take 20 mg by mouth daily. Monday/Wednesday/Friday  Hold for weight <113   Current Facility-Administered Medications for the 03/10/21 encounter (Office Visit) with Minna Merritts, MD  Medication   cyanocobalamin ((VITAMIN B-12)) injection 1,000 mcg     Allergies:   Sulfonamide derivatives, Other, Alendronate sodium, and Prednisone   Social History   Tobacco Use   Smoking status: Former    Pack years: 0.00    Types: Cigarettes    Quit date: 09/14/1947    Years since quitting: 73.5   Smokeless tobacco: Never  Substance Use Topics   Alcohol use: No    Alcohol/week: 0.0 standard drinks   Drug use: No     Family Hx: The patient's family  history includes Cancer in her brother; Diabetes in her brother; Heart disease in her sister; Heart disease (age of onset: 52) in her mother; Leukemia in her sister; Stroke (age of onset: 1) in her father.  ROS:   Please see the history of present illness.    Review of Systems  Constitutional:  Positive for malaise/fatigue and weight loss.  HENT: Negative.    Cardiovascular: Negative.   Gastrointestinal: Negative.   Musculoskeletal: Negative.   Neurological: Negative.   Psychiatric/Behavioral: Negative.    All other systems reviewed and are negative.   Labs/Other Tests and Data Reviewed:    Recent Labs: No results found for  requested labs within last 8760 hours.   Recent Lipid Panel Lab Results  Component Value Date/Time   CHOL 133 10/19/2017 12:11 PM   TRIG 55.0 10/19/2017 12:11 PM   HDL 49.40 10/19/2017 12:11 PM   CHOLHDL 3 10/19/2017 12:11 PM   LDLCALC 72 10/19/2017 12:11 PM    Wt Readings from Last 3 Encounters:  03/10/21 103 lb 2 oz (46.8 kg)  09/10/20 114 lb (51.7 kg)  08/01/20 118 lb (53.5 kg)     Exam:    BP 104/60 (BP Location: Right Arm, Patient Position: Sitting, Cuff Size: Normal)   Pulse 69   Ht 5\' 6"  (1.676 m)   Wt 103 lb 2 oz (46.8 kg)   BMI 16.64 kg/m  Constitutional: Very thin, weak, oriented to person, place, and time. No distress.  HENT:  Head: Grossly normal Eyes:  no discharge. No scleral icterus.  Neck: No JVD, no carotid bruits  Cardiovascular: irreg irreg no murmurs appreciated Pulmonary/Chest: Clear to auscultation bilaterally, no wheezes or rails Abdominal: Soft.  no distension.  no tenderness.  Musculoskeletal: Normal range of motion Neurological:  normal muscle tone. Coordination normal. No atrophy Skin: Skin warm and dry Psychiatric: normal affect, pleasant   ASSESSMENT & PLAN:    Chronic systolic CHF (congestive heart failure) ([employer]) Appears euvolemic Lab work requested from [employer], is not available in the [facility] or  [facility] system Still on torsemide 10 mg 2 days a week Prior dosing torsemide 20 have been held given dramatic weight loss No changes made to the medications  Pulmonary hypertension ([place]) moderately elevated right heart pressures on prior echoes No edema, denies shortness of breath, weight is way down, torsemide has been cut back Lab work requested from [employer]  Chronic obstructive pulmonary disease, unspecified COPD type ([place]) Chronic stable shortness of breath Relatively inactive, no complaints, not on oxygen  Cardiomyopathy, ischemic No further ischemic work-up  Persistent atrial fibrillation Not a good candidate for anticoagulation given prior falls Rate relatively well controlled, on low-dose metoprolol   Total encounter time more than 25 minutes  Greater than 50% was spent in counseling and coordination of care with the patient   Signed, Ida Rogue, MD  03/10/2021 3:33 PM    [place] Office [address] [place] [address], [place], [place] [address]

## 2022-01-11 DEATH — deceased
